# Patient Record
Sex: Male | Born: 1940 | Race: White | Hispanic: No | Marital: Married | Smoking: Never smoker
Health system: Southern US, Community
[De-identification: ages and names within clinical notes are randomized; demographics above are authoritative.]

## PROBLEM LIST (undated history)

## (undated) DIAGNOSIS — I499 Cardiac arrhythmia, unspecified: Secondary | ICD-10-CM

## (undated) DIAGNOSIS — M1990 Unspecified osteoarthritis, unspecified site: Secondary | ICD-10-CM

## (undated) DIAGNOSIS — R011 Cardiac murmur, unspecified: Secondary | ICD-10-CM

## (undated) DIAGNOSIS — I2510 Atherosclerotic heart disease of native coronary artery without angina pectoris: Secondary | ICD-10-CM

## (undated) DIAGNOSIS — F419 Anxiety disorder, unspecified: Secondary | ICD-10-CM

## (undated) DIAGNOSIS — R55 Syncope and collapse: Secondary | ICD-10-CM

## (undated) DIAGNOSIS — E785 Hyperlipidemia, unspecified: Secondary | ICD-10-CM

## (undated) DIAGNOSIS — G709 Myoneural disorder, unspecified: Secondary | ICD-10-CM

## (undated) DIAGNOSIS — M4800 Spinal stenosis, site unspecified: Secondary | ICD-10-CM

## (undated) DIAGNOSIS — I6529 Occlusion and stenosis of unspecified carotid artery: Secondary | ICD-10-CM

## (undated) DIAGNOSIS — I509 Heart failure, unspecified: Secondary | ICD-10-CM

## (undated) DIAGNOSIS — T7840XA Allergy, unspecified, initial encounter: Secondary | ICD-10-CM

## (undated) DIAGNOSIS — N400 Enlarged prostate without lower urinary tract symptoms: Secondary | ICD-10-CM

## (undated) DIAGNOSIS — I219 Acute myocardial infarction, unspecified: Secondary | ICD-10-CM

## (undated) DIAGNOSIS — I5030 Unspecified diastolic (congestive) heart failure: Secondary | ICD-10-CM

## (undated) DIAGNOSIS — I255 Ischemic cardiomyopathy: Secondary | ICD-10-CM

## (undated) DIAGNOSIS — M419 Scoliosis, unspecified: Secondary | ICD-10-CM

## (undated) DIAGNOSIS — H269 Unspecified cataract: Secondary | ICD-10-CM

## (undated) DIAGNOSIS — E119 Type 2 diabetes mellitus without complications: Secondary | ICD-10-CM

## (undated) DIAGNOSIS — T8859XA Other complications of anesthesia, initial encounter: Secondary | ICD-10-CM

## (undated) DIAGNOSIS — I10 Essential (primary) hypertension: Secondary | ICD-10-CM

## (undated) HISTORY — DX: Type 2 diabetes mellitus without complications: E11.9

## (undated) HISTORY — DX: Unspecified diastolic (congestive) heart failure: I50.30

## (undated) HISTORY — DX: Heart failure, unspecified: I50.9

## (undated) HISTORY — DX: Occlusion and stenosis of unspecified carotid artery: I65.29

## (undated) HISTORY — DX: Hyperlipidemia, unspecified: E78.5

## (undated) HISTORY — DX: Syncope and collapse: R55

## (undated) HISTORY — DX: Scoliosis, unspecified: M41.9

## (undated) HISTORY — DX: Allergy, unspecified, initial encounter: T78.40XA

## (undated) HISTORY — DX: Spinal stenosis, site unspecified: M48.00

## (undated) HISTORY — DX: Unspecified cataract: H26.9

## (undated) HISTORY — PX: SPINE SURGERY: SHX786

## (undated) HISTORY — DX: Benign prostatic hyperplasia without lower urinary tract symptoms: N40.0

## (undated) HISTORY — DX: Anxiety disorder, unspecified: F41.9

## (undated) HISTORY — PX: EYE SURGERY: SHX253

## (undated) HISTORY — PX: HERNIA REPAIR: SHX51

## (undated) HISTORY — PX: TONSILLECTOMY AND ADENOIDECTOMY: SUR1326

## (undated) HISTORY — DX: Atherosclerotic heart disease of native coronary artery without angina pectoris: I25.10

## (undated) HISTORY — PX: CORONARY ARTERY BYPASS GRAFT: SHX141

## (undated) HISTORY — DX: Myoneural disorder, unspecified: G70.9

## (undated) HISTORY — PX: CARDIAC CATHETERIZATION: SHX172

---

## 1988-04-07 HISTORY — PX: TRANSURETHRAL RESECTION OF PROSTATE: SHX73

## 2015-04-11 DIAGNOSIS — E784 Other hyperlipidemia: Secondary | ICD-10-CM | POA: Diagnosis not present

## 2015-04-11 DIAGNOSIS — I25810 Atherosclerosis of coronary artery bypass graft(s) without angina pectoris: Secondary | ICD-10-CM | POA: Diagnosis not present

## 2015-04-11 DIAGNOSIS — E119 Type 2 diabetes mellitus without complications: Secondary | ICD-10-CM | POA: Diagnosis not present

## 2015-04-11 DIAGNOSIS — M545 Low back pain: Secondary | ICD-10-CM | POA: Diagnosis not present

## 2015-04-11 DIAGNOSIS — I10 Essential (primary) hypertension: Secondary | ICD-10-CM | POA: Diagnosis not present

## 2015-04-11 DIAGNOSIS — M47816 Spondylosis without myelopathy or radiculopathy, lumbar region: Secondary | ICD-10-CM | POA: Diagnosis not present

## 2015-04-11 DIAGNOSIS — M4806 Spinal stenosis, lumbar region: Secondary | ICD-10-CM | POA: Diagnosis not present

## 2015-04-17 DIAGNOSIS — M545 Low back pain: Secondary | ICD-10-CM | POA: Diagnosis not present

## 2015-04-25 DIAGNOSIS — M4806 Spinal stenosis, lumbar region: Secondary | ICD-10-CM | POA: Diagnosis not present

## 2015-04-25 DIAGNOSIS — M545 Low back pain: Secondary | ICD-10-CM | POA: Diagnosis not present

## 2015-04-25 DIAGNOSIS — M47816 Spondylosis without myelopathy or radiculopathy, lumbar region: Secondary | ICD-10-CM | POA: Diagnosis not present

## 2015-04-30 DIAGNOSIS — M4806 Spinal stenosis, lumbar region: Secondary | ICD-10-CM | POA: Diagnosis not present

## 2015-05-01 DIAGNOSIS — M4806 Spinal stenosis, lumbar region: Secondary | ICD-10-CM | POA: Diagnosis not present

## 2015-05-04 DIAGNOSIS — M4806 Spinal stenosis, lumbar region: Secondary | ICD-10-CM | POA: Diagnosis not present

## 2015-05-09 DIAGNOSIS — M4806 Spinal stenosis, lumbar region: Secondary | ICD-10-CM | POA: Diagnosis not present

## 2015-05-14 DIAGNOSIS — M4806 Spinal stenosis, lumbar region: Secondary | ICD-10-CM | POA: Diagnosis not present

## 2015-05-16 DIAGNOSIS — M4806 Spinal stenosis, lumbar region: Secondary | ICD-10-CM | POA: Diagnosis not present

## 2015-05-21 DIAGNOSIS — M4806 Spinal stenosis, lumbar region: Secondary | ICD-10-CM | POA: Diagnosis not present

## 2015-05-22 DIAGNOSIS — M4806 Spinal stenosis, lumbar region: Secondary | ICD-10-CM | POA: Diagnosis not present

## 2015-06-05 DIAGNOSIS — M4806 Spinal stenosis, lumbar region: Secondary | ICD-10-CM | POA: Diagnosis not present

## 2015-06-20 DIAGNOSIS — M545 Low back pain: Secondary | ICD-10-CM | POA: Diagnosis not present

## 2015-06-20 DIAGNOSIS — M4806 Spinal stenosis, lumbar region: Secondary | ICD-10-CM | POA: Diagnosis not present

## 2015-07-02 DIAGNOSIS — Z6833 Body mass index (BMI) 33.0-33.9, adult: Secondary | ICD-10-CM | POA: Diagnosis not present

## 2015-07-02 DIAGNOSIS — Z719 Counseling, unspecified: Secondary | ICD-10-CM | POA: Diagnosis not present

## 2015-07-02 DIAGNOSIS — J209 Acute bronchitis, unspecified: Secondary | ICD-10-CM | POA: Diagnosis not present

## 2015-07-02 DIAGNOSIS — R062 Wheezing: Secondary | ICD-10-CM | POA: Diagnosis not present

## 2015-07-26 DIAGNOSIS — I25119 Atherosclerotic heart disease of native coronary artery with unspecified angina pectoris: Secondary | ICD-10-CM | POA: Diagnosis not present

## 2015-07-26 DIAGNOSIS — E119 Type 2 diabetes mellitus without complications: Secondary | ICD-10-CM | POA: Diagnosis not present

## 2015-07-26 DIAGNOSIS — I10 Essential (primary) hypertension: Secondary | ICD-10-CM | POA: Diagnosis not present

## 2015-07-26 DIAGNOSIS — E785 Hyperlipidemia, unspecified: Secondary | ICD-10-CM | POA: Diagnosis not present

## 2015-09-24 DIAGNOSIS — I10 Essential (primary) hypertension: Secondary | ICD-10-CM | POA: Diagnosis not present

## 2015-09-24 DIAGNOSIS — E119 Type 2 diabetes mellitus without complications: Secondary | ICD-10-CM | POA: Diagnosis not present

## 2015-09-24 DIAGNOSIS — I25810 Atherosclerosis of coronary artery bypass graft(s) without angina pectoris: Secondary | ICD-10-CM | POA: Diagnosis not present

## 2015-09-24 DIAGNOSIS — E784 Other hyperlipidemia: Secondary | ICD-10-CM | POA: Diagnosis not present

## 2015-11-05 DIAGNOSIS — E784 Other hyperlipidemia: Secondary | ICD-10-CM | POA: Diagnosis not present

## 2015-11-05 DIAGNOSIS — I25810 Atherosclerosis of coronary artery bypass graft(s) without angina pectoris: Secondary | ICD-10-CM | POA: Diagnosis not present

## 2015-11-05 DIAGNOSIS — I10 Essential (primary) hypertension: Secondary | ICD-10-CM | POA: Diagnosis not present

## 2015-11-30 DIAGNOSIS — E119 Type 2 diabetes mellitus without complications: Secondary | ICD-10-CM | POA: Diagnosis not present

## 2015-11-30 DIAGNOSIS — H35363 Drusen (degenerative) of macula, bilateral: Secondary | ICD-10-CM | POA: Diagnosis not present

## 2015-12-19 DIAGNOSIS — N401 Enlarged prostate with lower urinary tract symptoms: Secondary | ICD-10-CM | POA: Diagnosis not present

## 2015-12-19 DIAGNOSIS — E785 Hyperlipidemia, unspecified: Secondary | ICD-10-CM | POA: Diagnosis not present

## 2015-12-19 DIAGNOSIS — E119 Type 2 diabetes mellitus without complications: Secondary | ICD-10-CM | POA: Diagnosis not present

## 2015-12-19 DIAGNOSIS — I10 Essential (primary) hypertension: Secondary | ICD-10-CM | POA: Diagnosis not present

## 2016-04-08 DIAGNOSIS — Z23 Encounter for immunization: Secondary | ICD-10-CM | POA: Diagnosis not present

## 2016-04-24 ENCOUNTER — Ambulatory Visit: Payer: Self-pay | Admitting: Adult Health

## 2016-04-28 ENCOUNTER — Telehealth: Payer: Self-pay | Admitting: Adult Health

## 2016-04-28 NOTE — Telephone Encounter (Signed)
Cld [id number]# left patient msg to call back for rescheduling of missed 1/18 appt.--glh

## 2016-05-20 ENCOUNTER — Ambulatory Visit (INDEPENDENT_AMBULATORY_CARE_PROVIDER_SITE_OTHER): Payer: Medicare Other | Admitting: Adult Health

## 2016-05-20 ENCOUNTER — Encounter: Payer: Self-pay | Admitting: Adult Health

## 2016-05-20 VITALS — BP 128/74 | HR 54 | Ht 65.25 in | Wt 207.4 lb

## 2016-05-20 DIAGNOSIS — I25110 Atherosclerotic heart disease of native coronary artery with unstable angina pectoris: Secondary | ICD-10-CM | POA: Insufficient documentation

## 2016-05-20 DIAGNOSIS — I25118 Atherosclerotic heart disease of native coronary artery with other forms of angina pectoris: Secondary | ICD-10-CM

## 2016-05-20 DIAGNOSIS — R5383 Other fatigue: Secondary | ICD-10-CM

## 2016-05-20 DIAGNOSIS — I10 Essential (primary) hypertension: Secondary | ICD-10-CM | POA: Diagnosis not present

## 2016-05-20 DIAGNOSIS — Z0000 Encounter for general adult medical examination without abnormal findings: Secondary | ICD-10-CM

## 2016-05-20 DIAGNOSIS — E1159 Type 2 diabetes mellitus with other circulatory complications: Secondary | ICD-10-CM

## 2016-05-20 DIAGNOSIS — E1169 Type 2 diabetes mellitus with other specified complication: Secondary | ICD-10-CM | POA: Diagnosis not present

## 2016-05-20 DIAGNOSIS — E119 Type 2 diabetes mellitus without complications: Secondary | ICD-10-CM | POA: Diagnosis not present

## 2016-05-20 DIAGNOSIS — I209 Angina pectoris, unspecified: Secondary | ICD-10-CM | POA: Diagnosis not present

## 2016-05-20 DIAGNOSIS — E785 Hyperlipidemia, unspecified: Secondary | ICD-10-CM | POA: Diagnosis not present

## 2016-05-20 DIAGNOSIS — I152 Hypertension secondary to endocrine disorders: Secondary | ICD-10-CM

## 2016-05-20 LAB — POCT UA - MICROALBUMIN
Albumin/Creatinine Ratio, Urine, POC: <30
Creatinine, POC: 50 mg/dL
MICROALBUMIN (UR) POC: 30 mg/L

## 2016-05-20 LAB — POCT GLYCOSYLATED HEMOGLOBIN (HGB A1C): HEMOGLOBIN A1C: 5.9

## 2016-05-20 NOTE — Assessment & Plan Note (Signed)
Continue medications as directed. If needing daily Nitrostat 0.4 SL dosage (increase from 5 times weekly), please be seen in clinic immediately.   Referral for local Cardiologist placed-please establish with new practice.

## 2016-05-20 NOTE — Assessment & Plan Note (Signed)
Continue current medications and healthy eating/regular exercise. A1c today 5.9-great job!

## 2016-05-20 NOTE — Patient Instructions (Addendum)
Acute Coronary Syndrome Acute coronary syndrome (ACS) is a serious problem in which there is suddenly not enough blood and oxygen supplied to the heart. ACS may mean that one or more of the blood vessels in your heart (coronary arteries) may be blocked. ACS can result in chest pain or a heart attack (myocardial infarction or MI). What are the causes? This condition is caused by atherosclerosis, which is the buildup of fat and cholesterol (plaque) on the inside of the arteries. Over time, the plaque may narrow or block the artery, and this will lessen blood flow to the heart. Plaque can also become weak and break off within a coronary artery to form a clot and cause a sudden blockage. What increases the risk? The risk factors of this condition include:  High cholesterol levels.  High blood pressure (hypertension).  Smoking.  Diabetes.  Age.  Family history of chest pain, heart disease, or stroke.  Lack of exercise. What are the signs or symptoms? The most common signs of this condition include:  Chest pain, which can be:  A crushing or squeezing in the chest.  A tightness, pressure, fullness, or heaviness in the chest.  Present for more than a few minutes, or it can stop and recur.  Pain in the arms, neck, jaw, or back.  Unexplained heartburn or indigestion.  Shortness of breath.  Nausea.  Sudden cold sweats.  Feeling light-headed or dizzy. Sometimes, this condition has no symptoms. How is this diagnosed? ACS may be diagnosed through the following tests:  Electrocardiogram (ECG).  Blood tests.  Coronary angiogram. This is a procedure to look at the coronary arteries to see if there is any blockage. How is this treated? Treatment for ACS may include:  Healthy behavioral changes to reduce or control risk factors.  Medicine.  Coronary stenting.A stent helps to keep an artery open.  Coronary angioplasty. This procedure widens a narrowed or blocked  artery.  Coronary artery bypass surgery. This will allow your blood to pass the blockage (bypass) to reach your heart. Follow these instructions at home: Eating and drinking  Follow a heart-healthy diet. A dietitian can you help to educate you about healthy food options and changes.  Use healthy cooking methods such as roasting, grilling, broiling, baking, poaching, steaming, or stir-frying. Talk to a dietitian to learn more about healthy cooking methods. Medicines  Take medicines only as directed by your health care provider.  Do not take the following medicines unless your health care provider approves:  Nonsteroidal anti-inflammatory drugs (NSAIDs), such as ibuprofen, naproxen, or celecoxib.  Vitamin supplements that contain vitamin A, vitamin E, or both.  Hormone replacement therapy that contains estrogen with or without progestin.  Stop illegal drug use. Activity  Follow an exercise program that is approved by your health care provider.  Plan rest periods when you are fatigued. Lifestyle  Do not use any tobacco products, including cigarettes, chewing tobacco, or electronic cigarettes. If you need help quitting, ask your health care provider.  If you drink alcohol, and your health care provider approves, limit your alcohol intake to no more than 1 drink per day. One drink equals 12 ounces of beer, 5 ounces of wine, or 1 ounces of hard liquor.  Learn to manage stress.  Maintain a healthy weight. Lose weight as approved by your health care provider. General instructions  Manage other health conditions, such as hypertension and diabetes, as directed by your health care provider.  Keep all follow-up visits as directed by your  health care provider. This is important.  Your health care provider may ask you to monitor your blood pressure. A blood pressure reading consists of a higher number over a lower number, such as 110 over 72, written as 110/72. Ideally, your blood  pressure should be:  Below 140/90 if you have no other medical conditions.  Below 130/80 if you have diabetes or kidney disease. Get help right away if:  You have pain in your chest, neck, arm, jaw, stomach, or back that lasts more than a few minutes, is recurring, or is not relieved by taking medicine under your tongue (sublingual nitroglycerin).  You have profuse sweating without cause.  You have unexplained:  Heartburn or indigestion.  Shortness of breath or difficulty breathing.  Nausea or vomiting.  Fatigue.  Feelings of nervousness or anxiety.  Weakness.  Diarrhea.  You have sudden light-headedness or dizziness.  You faint. These symptoms may represent a serious problem that is an emergency. Do not wait to see if the symptoms will go away. Get medical help right away. Call your local emergency services (911 in the [place] Do not drive yourself to the clinic or hospital.  This information is not intended to replace advice given to you by your health care provider. Make sure you discuss any questions you have with your health care provider. Document Released: 03/24/2005 Document Revised: 09/05/2015 Document Reviewed: 07/26/2013 Elsevier Interactive Patient Education  2017 [place] and Cholesterol Restricted Diet Introduction Getting too much fat and cholesterol in your diet may cause health problems. Following this diet helps keep your fat and cholesterol at normal levels. This can keep you from getting sick. What types of fat should I choose?  Choose monosaturated and polyunsaturated fats. These are found in foods such as olive oil, canola oil, flaxseeds, walnuts, almonds, and seeds.  Eat more omega-3 fats. Good choices include salmon, mackerel, sardines, tuna, flaxseed oil, and ground flaxseeds.  Limit saturated fats. These are in animal products such as meats, butter, and cream. They can also be in plant products such as palm oil, palm kernel oil, and  coconut oil.  Avoid foods with partially hydrogenated oils in them. These contain trans fats. Examples of foods that have trans fats are stick margarine, some tub margarines, cookies, crackers, and other baked goods. What general guidelines do I need to follow?  Check food labels. Look for the words "trans fat" and "saturated fat."  When preparing a meal:  Fill half of your plate with vegetables and green salads.  Fill one fourth of your plate with whole grains. Look for the word "whole" as the first word in the ingredient list.  Fill one fourth of your plate with lean protein foods.  Eat more foods that have fiber, like apples, carrots, beans, peas, and barley.  Eat more home-cooked foods. Eat less at restaurants and buffets.  Limit or avoid alcohol.  Limit foods high in starch and sugar.  Limit fried foods.  Cook foods without frying them. Baking, boiling, grilling, and broiling are all great options.  Lose weight if you are overweight. Losing even a small amount of weight can help your overall health. It can also help prevent diseases such as diabetes and heart disease. What foods can I eat? Grains  Whole grains, such as whole wheat or whole grain breads, crackers, cereals, and pasta. Unsweetened oatmeal, bulgur, barley, quinoa, or brown rice. Corn or whole wheat flour tortillas. Vegetables  Fresh or frozen vegetables (raw, steamed, roasted, or grilled).  Green salads. Fruits  All fresh, canned (in natural juice), or frozen fruits. Meat and Other Protein Products  Ground beef (85% or leaner), grass-fed beef, or beef trimmed of fat. Skinless chicken or [place]. Ground chicken or [place]. Pork trimmed of fat. All fish and seafood. Eggs. Dried beans, peas, or lentils. Unsalted nuts or seeds. Unsalted canned or dry beans. Dairy  Low-fat dairy products, such as skim or 1% milk, 2% or reduced-fat cheeses, low-fat ricotta or cottage cheese, or plain low-fat yogurt. Fats and Oils  Tub  margarines without trans fats. Light or reduced-fat mayonnaise and salad dressings. Avocado. Olive, canola, sesame, or safflower oils. Natural peanut or almond butter (choose ones without added sugar and oil). The items listed above may not be a complete list of recommended foods or beverages. Contact your dietitian for more options.  What foods are not recommended? Grains  White bread. White pasta. White rice. Cornbread. Bagels, pastries, and croissants. Crackers that contain trans fat. Vegetables  White potatoes. Corn. Creamed or fried vegetables. Vegetables in a cheese sauce. Fruits  Dried fruits. Canned fruit in light or heavy syrup. Fruit juice. Meat and Other Protein Products  Fatty cuts of meat. Ribs, chicken wings, bacon, sausage, bologna, salami, chitterlings, fatback, hot dogs, bratwurst, and packaged luncheon meats. Liver and organ meats. Dairy  Whole or 2% milk, cream, half-and-half, and cream cheese. Whole milk cheeses. Whole-fat or sweetened yogurt. Full-fat cheeses. Nondairy creamers and whipped toppings. Processed cheese, cheese spreads, or cheese curds. Sweets and Desserts  Corn syrup, sugars, honey, and molasses. Candy. Jam and jelly. Syrup. Sweetened cereals. Cookies, pies, cakes, donuts, muffins, and ice cream. Fats and Oils  Butter, stick margarine, lard, shortening, ghee, or bacon fat. Coconut, palm kernel, or palm oils. Beverages  Alcohol. Sweetened drinks (such as sodas, lemonade, and fruit drinks or punches). The items listed above may not be a complete list of foods and beverages to avoid. Contact your dietitian for more information.  This information is not intended to replace advice given to you by your health care provider. Make sure you discuss any questions you have with your health care provider. Document Released: 09/23/2011 Document Revised: 11/29/2015 Document Reviewed: 06/23/2013  2017 Elsevier Diabetes Mellitus and Exercise Exercising regularly is  important for your overall health, especially when you have diabetes (diabetes mellitus). Exercising is not only about losing weight. It has many health benefits, such as increasing muscle strength and bone density and reducing body fat and stress. This leads to improved fitness, flexibility, and endurance, all of which result in better overall health. Exercise has additional benefits for people with diabetes, including:  Reducing appetite.  Helping to lower and control blood glucose.  Lowering blood pressure.  Helping to control amounts of fatty substances (lipids) in the blood, such as cholesterol and triglycerides.  Helping the body to respond better to insulin (improving insulin sensitivity).  Reducing how much insulin the body needs.  Decreasing the risk for heart disease by:  Lowering cholesterol and triglyceride levels.  Increasing the levels of good cholesterol.  Lowering blood glucose levels. What is my activity plan? Your health care provider or certified diabetes educator can help you make a plan for the type and frequency of exercise (activity plan) that works for you. Make sure that you:  Do at least 150 minutes of moderate-intensity or vigorous-intensity exercise each week. This could be brisk walking, biking, or water aerobics.  Do stretching and strength exercises, such as yoga or weightlifting, at least 2 times  a week.  Spread out your activity over at least 3 days of the week.  Get some form of physical activity every day.  Do not go more than 2 days in a row without some kind of physical activity.  Avoid being inactive for more than 90 minutes at a time. Take frequent breaks to walk or stretch.  Choose a type of exercise or activity that you enjoy, and set realistic goals.  Start slowly, and gradually increase the intensity of your exercise over time. What do I need to know about managing my diabetes?  Check your blood glucose before and after  exercising.  If your blood glucose is higher than 240 mg/dL (13.3 mmol/L) before you exercise, check your urine for ketones. If you have ketones in your urine, do not exercise until your blood glucose returns to normal.  Know the symptoms of low blood glucose (hypoglycemia) and how to treat it. Your risk for hypoglycemia increases during and after exercise. Common symptoms of hypoglycemia can include:  Hunger.  Anxiety.  Sweating and feeling clammy.  Confusion.  Dizziness or feeling light-headed.  Increased heart rate or palpitations.  Blurry vision.  Tingling or numbness around the mouth, lips, or tongue.  Tremors or shakes.  Irritability.  Keep a rapid-acting carbohydrate snack available before, during, and after exercise to help prevent or treat hypoglycemia.  Avoid injecting insulin into areas of the body that are going to be exercised. For example, avoid injecting insulin into:  The arms, when playing tennis.  The legs, when jogging.  Keep records of your exercise habits. Doing this can help you and your health care provider adjust your diabetes management plan as needed. Write down:  Food that you eat before and after you exercise.  Blood glucose levels before and after you exercise.  The type and amount of exercise you have done.  When your insulin is expected to peak, if you use insulin. Avoid exercising at times when your insulin is peaking.  When you start a new exercise or activity, work with your health care provider to make sure the activity is safe for you, and to adjust your insulin, medicines, or food intake as needed.  Drink plenty of water while you exercise to prevent dehydration or heat stroke. Drink enough fluid to keep your urine clear or pale yellow. This information is not intended to replace advice given to you by your health care provider. Make sure you discuss any questions you have with your health care provider. Document Released:  06/14/2003 Document Revised: 10/12/2015 Document Reviewed: 09/03/2015 Elsevier Interactive Patient Education  2017 [place].  Referral for Cardiologist placed. Return for fasting lab nurse visit ASAP. Follow-up every 6 months, or sooner if needed.

## 2016-05-20 NOTE — Progress Notes (Signed)
Subjective:    Patient ID: Jacob Harrison, male    DOB: [birth year], 76 y.o.    MRN: [id number]  HPI:  Jacob Harrison is here to establish as a new pt.  He is very pleasant 76 year old with significant medical hx of CAD, HTN, T2D, Hyperlipidemia, and obesity.  He is compliant on all his medications and denies SE.  He visits Cardiologist every 6 months, next appt. March 2018.  He volunteers and works in yard almost daily.  He reports needing to use 1 Nitrostat 0.4 tab SL 5 days a week.  The angina will occur when he is seated/resting, and sx's a re completed resolved after just 1 Nitrostat dose.     Patient Care Team    Relationship Specialty Notifications Start End  Jacob Aquas, NP PCP - General Family Medicine  05/20/16     Patient Active Problem List   Diagnosis Date Noted  . Diabetes mellitus without complication ([place]) [id number]  . Hypertension associated with diabetes ([place]) 05/20/2016  . Hyperlipidemia associated with type 2 diabetes mellitus ([place]) 05/20/2016  . Coronary artery disease 05/20/2016     Past Medical History:  Diagnosis Date  . BPH (benign prostatic hyperplasia)   . CHF (congestive heart failure) ([place])   . Coronary artery disease   . Diabetes mellitus without complication ([place])   . Hyperlipidemia      Past Surgical History:  Procedure Laterality Date  . CORONARY ARTERY BYPASS GRAFT     x 2  . HERNIA REPAIR Left    inguinal  . TONSILLECTOMY AND ADENOIDECTOMY    . TRANSURETHRAL RESECTION OF PROSTATE       Family History  Problem Relation Age of Onset  . Diabetes Mother   . Hyperlipidemia Mother   . Cancer Father     colon  . Alcohol abuse Maternal Uncle   . Diabetes Maternal Uncle   . Hyperlipidemia Maternal Uncle   . Cancer Paternal Uncle     colon     History  Drug Use No     History  Alcohol Use  . 8.4 oz/week  . 7 Glasses of wine, 7 Shots of liquor per week     History  Smoking Status  . Never Smoker  Smokeless Tobacco  . Never  Used     Outpatient Encounter Prescriptions as of 05/20/2016  Medication Sig  . aspirin 325 MG EC tablet Take 325 mg by mouth daily.  . Coenzyme Q10 (CO Q-10) 200 MG CAPS Take 1 capsule by mouth daily.  . cyanocobalamin 500 MCG tablet Take 500 mcg by mouth daily.  . finasteride (PROSCAR) 5 MG tablet Take 1 tablet by mouth daily.  . Glucosamine-Chondroit-Vit C-Mn (GLUCOSAMINE 1500 COMPLEX) CAPS Take 1 capsule by mouth daily.  . metFORMIN (GLUCOPHAGE) 500 MG tablet Take 1,000 mg by mouth daily.  . metoprolol tartrate (LOPRESSOR) 25 MG tablet Take 1 tablet by mouth 2 (two) times daily.  . Multiple Vitamin (MULTIVITAMIN) tablet Take 1 tablet by mouth daily.  . nitroGLYCERIN (NITROSTAT) 0.4 MG SL tablet Place 1 tablet under the tongue as needed.  Marland Kitchen RANEXA 500 MG 12 hr tablet Take 1 tablet by mouth 2 (two) times daily.  . rosuvastatin (CRESTOR) 20 MG tablet Take 1 tablet by mouth at bedtime.  . traMADol (ULTRAM) 50 MG tablet Take by mouth every 6 (six) hours as needed.   No facility-administered encounter medications on file as of 05/20/2016.     Allergies: Tetracyclines & related  Body mass index is 34.25 kg/m.  Blood pressure 128/74, pulse (!) 54, height 5' 5.25" (1.657 m), weight 207 lb 6.4 oz (94.1 kg).     Review of Systems  Constitutional: Negative for activity change, appetite change, chills, diaphoresis, fatigue, fever and unexpected weight change.  HENT: Negative for congestion and postnasal drip.   Eyes: Negative for visual disturbance.  Respiratory: Negative for cough and shortness of breath.   Cardiovascular: Positive for chest pain. Negative for palpitations and leg swelling.       Requires 1 dose of Nitrostat 0.4 SL approximately 5 days a week. He will see Cardiologist March 2018.  Gastrointestinal: Negative for abdominal distention, abdominal pain, anal bleeding, blood in stool, constipation, diarrhea, nausea and vomiting.  Endocrine: Negative for cold intolerance,  heat intolerance, polydipsia, polyphagia and polyuria.  Genitourinary: Negative for difficulty urinating and flank pain.  Musculoskeletal: Positive for arthralgias and myalgias. Negative for back pain, gait problem, joint swelling, neck pain and neck stiffness.       "Minor aches and pains upon rising in the am that will improve with movement".  Skin: Negative for color change, pallor, rash and wound.  Neurological: Negative for dizziness, tremors, syncope and weakness.  Psychiatric/Behavioral: Negative for agitation, behavioral problems and sleep disturbance.       Objective:   Physical Exam  Constitutional: He is oriented to person, place, and time. He appears well-developed and well-nourished. No distress.  HENT:  Head: Normocephalic and atraumatic.  Right Ear: External ear normal.  Left Ear: External ear normal.  Eyes: Conjunctivae and EOM are normal. Pupils are equal, round, and reactive to light.  Neck: Normal range of motion. Neck supple. No tracheal deviation present. No thyromegaly present.  Cardiovascular: Normal rate, regular rhythm, normal heart sounds and intact distal pulses.   No murmur heard. Pulmonary/Chest: Effort normal and breath sounds normal. He has no wheezes. He exhibits no tenderness.  Abdominal: Soft. Bowel sounds are normal. He exhibits no distension and no mass. There is no tenderness. There is no rebound and no guarding.  Musculoskeletal: Normal range of motion. He exhibits no tenderness.  Lymphadenopathy:    He has no cervical adenopathy.  Neurological: He is alert and oriented to person, place, and time. He has normal reflexes.  Skin: Skin is warm and dry. No rash noted. He is not diaphoretic. No erythema. No pallor.  Psychiatric: He has a normal mood and affect. His behavior is normal. Judgment and thought content normal.  Nursing note and vitals reviewed.         Assessment & Plan:   1. Routine physical examination   2. Diabetes mellitus without  complication ([place])   3. Hypertension associated with diabetes ([place])   4. Hyperlipidemia associated with type 2 diabetes mellitus ([place])   5. Coronary artery disease of native heart with stable angina pectoris, unspecified vessel or lesion type ([place])   6. Other fatigue     Hypertension associated with diabetes ([place]) Continue medications as directed.   Referral for local Cardiologist placed-please establish with new practice.  Coronary artery disease Continue medications as directed. If needing daily Nitrostat 0.4 SL dosage (increase from 5 times weekly), please be seen in clinic immediately.   Referral for local Cardiologist placed-please establish with new practice.  Diabetes mellitus without complication ([place]) Continue current medications and healthy eating/regular exercise. A1c today 5.9-great job!  Hyperlipidemia associated with type 2 diabetes mellitus ([place]) Continue current medications and healthy eating/regular exercise.    FOLLOW-UP:  Return  in about 6 months (around 11/17/2016) for Regular Follow Up, Diabetes, HTN.

## 2016-05-20 NOTE — Assessment & Plan Note (Signed)
Continue current medications and healthy eating/regular exercise.

## 2016-05-20 NOTE — Assessment & Plan Note (Signed)
Continue medications as directed.   Referral for local Cardiologist placed-please establish with new practice.

## 2016-05-22 ENCOUNTER — Other Ambulatory Visit (INDEPENDENT_AMBULATORY_CARE_PROVIDER_SITE_OTHER): Payer: Medicare Other

## 2016-05-22 DIAGNOSIS — E785 Hyperlipidemia, unspecified: Secondary | ICD-10-CM

## 2016-05-22 DIAGNOSIS — E119 Type 2 diabetes mellitus without complications: Secondary | ICD-10-CM | POA: Diagnosis not present

## 2016-05-22 DIAGNOSIS — E1159 Type 2 diabetes mellitus with other circulatory complications: Secondary | ICD-10-CM | POA: Diagnosis not present

## 2016-05-22 DIAGNOSIS — I10 Essential (primary) hypertension: Secondary | ICD-10-CM | POA: Diagnosis not present

## 2016-05-22 DIAGNOSIS — R5383 Other fatigue: Secondary | ICD-10-CM | POA: Diagnosis not present

## 2016-05-22 DIAGNOSIS — E539 Vitamin B deficiency, unspecified: Secondary | ICD-10-CM | POA: Diagnosis not present

## 2016-05-22 DIAGNOSIS — Z0000 Encounter for general adult medical examination without abnormal findings: Secondary | ICD-10-CM | POA: Diagnosis not present

## 2016-05-22 DIAGNOSIS — E1169 Type 2 diabetes mellitus with other specified complication: Secondary | ICD-10-CM | POA: Diagnosis not present

## 2016-05-22 DIAGNOSIS — E559 Vitamin D deficiency, unspecified: Secondary | ICD-10-CM | POA: Diagnosis not present

## 2016-05-22 DIAGNOSIS — I152 Hypertension secondary to endocrine disorders: Secondary | ICD-10-CM

## 2016-05-22 DIAGNOSIS — R29898 Other symptoms and signs involving the musculoskeletal system: Secondary | ICD-10-CM | POA: Diagnosis not present

## 2016-05-23 LAB — COMPREHENSIVE METABOLIC PANEL
ALBUMIN: 4.5 g/dL (ref 3.5–4.8)
ALT: 33 IU/L (ref 0–44)
AST: 29 IU/L (ref 0–40)
Albumin/Globulin Ratio: 2.3 — ABNORMAL HIGH (ref 1.2–2.2)
Alkaline Phosphatase: 90 IU/L (ref 39–117)
BUN / CREAT RATIO: 20 (ref 10–24)
BUN: 15 mg/dL (ref 8–27)
Bilirubin Total: 0.5 mg/dL (ref 0.0–1.2)
CALCIUM: 9.6 mg/dL (ref 8.6–10.2)
CO2: 22 mmol/L (ref 18–29)
CREATININE: 0.76 mg/dL (ref 0.76–1.27)
Chloride: 97 mmol/L (ref 96–106)
GFR calc Af Amer: 105 mL/min/{1.73_m2} (ref >59)
GFR, EST NON AFRICAN AMERICAN: 91 mL/min/{1.73_m2} (ref >59)
GLUCOSE: 100 mg/dL — AB (ref 65–99)
Globulin, Total: 2 g/dL (ref 1.5–4.5)
Potassium: 4.6 mmol/L (ref 3.5–5.2)
Sodium: 139 mmol/L (ref 134–144)
TOTAL PROTEIN: 6.5 g/dL (ref 6.0–8.5)

## 2016-05-23 LAB — CBC WITH DIFFERENTIAL/PLATELET
BASOS ABS: 0 10*3/uL (ref 0.0–0.2)
Basos: 0 %
EOS (ABSOLUTE): 0.2 10*3/uL (ref 0.0–0.4)
Eos: 2 %
HEMOGLOBIN: 15.1 g/dL (ref 13.0–17.7)
Hematocrit: 45.9 % (ref 37.5–51.0)
IMMATURE GRANS (ABS): 0 10*3/uL (ref 0.0–0.1)
IMMATURE GRANULOCYTES: 0 %
LYMPHS: 26 %
Lymphocytes Absolute: 2.6 10*3/uL (ref 0.7–3.1)
MCH: 30.4 pg (ref 26.6–33.0)
MCHC: 32.9 g/dL (ref 31.5–35.7)
MCV: 92 fL (ref 79–97)
MONOCYTES: 9 %
Monocytes Absolute: 1 10*3/uL — ABNORMAL HIGH (ref 0.1–0.9)
NEUTROS PCT: 63 %
Neutrophils Absolute: 6.4 10*3/uL (ref 1.4–7.0)
PLATELETS: 228 10*3/uL (ref 150–379)
RBC: 4.97 x10E6/uL (ref 4.14–5.80)
RDW: 14 % (ref 12.3–15.4)
WBC: 10.3 10*3/uL (ref 3.4–10.8)

## 2016-05-23 LAB — LIPID PANEL
CHOL/HDL RATIO: 3.3 ratio (ref 0.0–5.0)
Cholesterol, Total: 169 mg/dL (ref 100–199)
HDL: 51 mg/dL (ref >39)
LDL CALC: 76 mg/dL (ref 0–99)
Triglycerides: 210 mg/dL — ABNORMAL HIGH (ref 0–149)
VLDL CHOLESTEROL CAL: 42 mg/dL — AB (ref 5–40)

## 2016-05-23 LAB — VITAMIN B12: Vitamin B-12: 1178 pg/mL (ref 232–1245)

## 2016-05-23 LAB — TSH: TSH: 1.64 u[IU]/mL (ref 0.450–4.500)

## 2016-05-23 LAB — VITAMIN D 25 HYDROXY (VIT D DEFICIENCY, FRACTURES): VIT D 25 HYDROXY: 26.4 ng/mL — AB (ref 30.0–100.0)

## 2016-05-26 ENCOUNTER — Other Ambulatory Visit: Payer: Self-pay | Admitting: Adult Health

## 2016-05-26 DIAGNOSIS — E781 Pure hyperglyceridemia: Secondary | ICD-10-CM

## 2016-05-26 MED ORDER — EZETIMIBE 10 MG PO TABS: 10.0000 mg | ORAL_TABLET | Freq: Every day | ORAL | 3 refills | Status: DC

## 2016-05-28 ENCOUNTER — Other Ambulatory Visit: Payer: Self-pay

## 2016-05-28 DIAGNOSIS — E1159 Type 2 diabetes mellitus with other circulatory complications: Secondary | ICD-10-CM

## 2016-05-28 DIAGNOSIS — I25118 Atherosclerotic heart disease of native coronary artery with other forms of angina pectoris: Secondary | ICD-10-CM

## 2016-05-28 DIAGNOSIS — I152 Hypertension secondary to endocrine disorders: Secondary | ICD-10-CM

## 2016-05-28 DIAGNOSIS — I10 Essential (primary) hypertension: Secondary | ICD-10-CM

## 2016-06-12 ENCOUNTER — Encounter: Payer: Self-pay | Admitting: Internal Medicine

## 2016-06-19 ENCOUNTER — Encounter: Payer: Self-pay | Admitting: Internal Medicine

## 2016-06-19 ENCOUNTER — Ambulatory Visit (INDEPENDENT_AMBULATORY_CARE_PROVIDER_SITE_OTHER): Payer: Medicare Other | Admitting: Internal Medicine

## 2016-06-19 VITALS — BP 134/58 | HR 58 | Ht 65.25 in | Wt 212.0 lb

## 2016-06-19 DIAGNOSIS — I25118 Atherosclerotic heart disease of native coronary artery with other forms of angina pectoris: Secondary | ICD-10-CM | POA: Diagnosis not present

## 2016-06-19 DIAGNOSIS — E785 Hyperlipidemia, unspecified: Secondary | ICD-10-CM | POA: Diagnosis not present

## 2016-06-19 DIAGNOSIS — R29898 Other symptoms and signs involving the musculoskeletal system: Secondary | ICD-10-CM

## 2016-06-19 MED ORDER — RANOLAZINE ER 1000 MG PO TB12: 1000.0000 mg | ORAL_TABLET | Freq: Two times a day (BID) | ORAL | 1 refills | Status: DC

## 2016-06-19 NOTE — Progress Notes (Signed)
New Outpatient Visit Date: 06/19/2016  Referring Provider: Odella Aquas, NP [address], [place] [address]  Chief Complaint: Chest pain  HPI:  Jacob Harrison is a 76 y.o. year-old male with history of coronary artery disease s/p CABG x 2 (2001 and 2012), type 2 diabetes mellitus, congestive heart failure, and hyperlipidemia, who has been referred by Dr. Arvil Persons for management of cardiac disease. The patient moved to [place] from [place], [place], in 01/2016 and was previously followed by [facility] in [place], [place]. He reports feeling well, though he has noticed a slight increase in the frequency of his chest pain. He occasionally notes tightness across the chest, which most commonly occurs at rest. The pain resolves promptly with a single sublingual nitroglycerin tablet. He is taking about 10-15 NTG tabs a month, compared with 5-10 about 3-6 months ago. There are no associated symptoms. Additionally, he is able to walk and do strenuous activities around the house without any chest pain or other limitations.  Jacob Harrison denies shortness of breath, orthopnea, PND, palpitations, and lightheadedness. He has mild dependent leg swelling, which he attributes to vein harvesting for CABG x 2. Jacob Harrison reports weakness in both legs at times, but denies pain and states that he was told that his leg circulation is fine. He is currently on rosuvastatin and ezetimibe for control of his cholesterol, as there was concern that atorvastatin may have been contributing to his leg weakness.  --------------------------------------------------------------------------------------------------  Cardiovascular History & Procedures: Cardiovascular Problems:  Coronary artery disease  Ischemic cardiomyopathy  Risk Factors:  Known coronary artery disease, hyperlipidemia, diabetes mellitus, male gender, obesity, and age > 53  Cath/PCI:  LHC (10/18/12, [place], [place]): LMCA 20-30%  ostial/proximal stenosis. LAD with sequential 100% and 95-99% mid stenoses. LCx with 40-50% mid stenosis. RCA with 100% proximal and 80-100% sequential distal lesions. RPDA and rPL1 have 99% and 95% stenoses, respectively. SVG->LAD and SVG->rPDA are patent. LVEF 67% with normal wall motion.  LHC (04/05/10, [place], [place]): LMCA with 30% mid stenosis. LAD with 100% midvessel occlution. LCx with 40% mid stenosis. RCA with 100% proximal stenosis. LIMA->LAD atretic, SVG->LCx occluded, and SVG-> RCA with 80% ostial and 70% distal stenoses.  CV Surgery:  Redo CABG (04/2010, [place], [place]): SVG->LAD and SVG->rPDA.  CABG (2001, [place]): LIMA->LAD, SVG->LCx, and SVG->rPDA  EP Procedures and Devices:  None  Non-Invasive Evaluation(s):  ABIs (03/08/15): Right 1.25, left 1.26; no significant change with exercise.  Exercise myocardial perfusion stress test (10/07/12): Small to moderate in size, mild to moderate in severity, partially reversible defect involving the mid inferior and inferolateral segments. LVEF 65%.  Recent CV Pertinent Labs: Lab Results  Component Value Date   CHOL 169 05/22/2016   HDL 51 05/22/2016   LDLCALC 76 05/22/2016   TRIG 210 (H) 05/22/2016   CHOLHDL 3.3 05/22/2016   K 4.6 05/22/2016   BUN 15 05/22/2016   CREATININE 0.76 05/22/2016    --------------------------------------------------------------------------------------------------  Past Medical History:  Diagnosis Date  . BPH (benign prostatic hyperplasia)   . CHF (congestive heart failure) ([place])   . Coronary artery disease   . Diabetes mellitus without complication ([place])   . Hyperlipidemia     Past Surgical History:  Procedure Laterality Date  . CORONARY ARTERY BYPASS GRAFT     x 2 (2001 and redo in late 2011 or early 2012)  . HERNIA REPAIR Left    inguinal  . TONSILLECTOMY AND ADENOIDECTOMY    . TRANSURETHRAL  [place]    Outpatient Encounter Prescriptions as of  06/19/2016  Medication Sig  . aspirin 325 MG EC tablet Take 325 mg by mouth daily.  . Cholecalciferol (VITAMIN D-3 PO) Take 800 Units by mouth daily.  . Coenzyme Q10 (CO Q-10) 200 MG CAPS Take 1 capsule by mouth daily.  . cyanocobalamin 500 MCG tablet Take 500 mcg by mouth daily.  Marland Kitchen ezetimibe (ZETIA) 10 MG tablet Take 1 tablet (10 mg total) by mouth daily.  . finasteride (PROSCAR) 5 MG tablet Take 1 tablet by mouth daily.  . Glucosamine-Chondroit-Vit C-Mn (GLUCOSAMINE 1500 COMPLEX) CAPS Take 1 capsule by mouth daily.  . metFORMIN (GLUCOPHAGE) 500 MG tablet Take 1,000 mg by mouth daily.  . metoprolol tartrate (LOPRESSOR) 25 MG tablet Take 1 tablet by mouth 2 (two) times daily.  . Multiple Vitamin (MULTIVITAMIN) tablet Take 1 tablet by mouth daily.  . nitroGLYCERIN (NITROSTAT) 0.4 MG SL tablet Place 1 tablet under the tongue as needed.  Marland Kitchen RANEXA 500 MG 12 hr tablet Take 1 tablet by mouth 2 (two) times daily.  . rosuvastatin (CRESTOR) 20 MG tablet Take 1 tablet by mouth at bedtime.  . traMADol (ULTRAM) 50 MG tablet Take by mouth every 6 (six) hours as needed.   No facility-administered encounter medications on file as of 06/19/2016.     Allergies: Tetracyclines & related  Social History   Social History  . Marital status: Married    Spouse name: N/A  . Number of children: N/A  . Years of education: N/A   Occupational History  . Not on file.   Social History Main Topics  . Smoking status: Never Smoker  . Smokeless tobacco: Never Used  . Alcohol use 6.6 oz/week    6 Cans of beer, 5 Glasses of wine per week  . Drug use: No  . Sexual activity: Not Currently    Birth control/ protection: None   Other Topics Concern  . Not on file   Social History Narrative  . No narrative on file    Family History  Problem Relation Age of Onset  . Diabetes Mother   . Hyperlipidemia Mother   . Cancer Father     colon  . Alcohol abuse Maternal Uncle   . Diabetes Maternal Uncle   .  Hyperlipidemia Maternal Uncle   . Cancer Paternal Uncle     colon    Review of Systems: A 12-system review of systems was performed and was negative except as noted in the HPI.  --------------------------------------------------------------------------------------------------  Physical Exam: BP (!) 134/58   Pulse (!) 58 Comment: 58  Ht 5' 5.25" (1.657 m)   Wt 212 lb (96.2 kg)   SpO2 94%   BMI 35.01 kg/m   General:  Obese man, seated comfortably in the exam room. HEENT: No conjunctival pallor or scleral icterus.  Moist mucous membranes.  OP clear. Neck: Supple without lymphadenopathy, thyromegaly, JVD, or HJR.  No carotid bruit. Lungs: Normal work of breathing.  Clear to auscultation bilaterally without wheezes or crackles. Heart: Regular rate and rhythm without murmurs, rubs, or gallops. Non-displaced PMI. Well-healed median sternotomy scar. Abd: Bowel sounds present.  Soft, NT/ND without hepatosplenomegaly Ext: Trace pretibial edema.  Radial and DP pulses are 2+ bilaterally. PT pulses are 1+ bilaterally. Skin: warm and dry without rash Neuro: CNIII-XII intact.  Strength and fine-touch sensation intact in upper and lower extremities bilaterally. Psych: Normal mood and affect.  EKG:  Sinus bradycardia (HR 52 bpm). Otherwise,  no significant abnormalities. No significant change from prior outside tracing on 08/07/14 (I have personally reviewed both tracings).  Lab Results  Component Value Date   WBC 10.3 05/22/2016   HCT 45.9 05/22/2016   MCV 92 05/22/2016   PLT 228 05/22/2016    Lab Results  Component Value Date   NA 139 05/22/2016   K 4.6 05/22/2016   CL 97 05/22/2016   CO2 22 05/22/2016   BUN 15 05/22/2016   CREATININE 0.76 05/22/2016   GLUCOSE 100 (H) 05/22/2016   ALT 33 05/22/2016    Lab Results  Component Value Date   CHOL 169 05/22/2016   HDL 51 05/22/2016   LDLCALC 76 05/22/2016   TRIG 210 (H) 05/22/2016   CHOLHDL 3.3 05/22/2016      --------------------------------------------------------------------------------------------------  ASSESSMENT AND PLAN: Coronary artery disease with stable angina Patient notes slight increase in NTG use over the last 3 months, though his chest pain is otherwise unchanged, consistent with stable angina. Interesting, he is able to walk and perform strenuous activities around the house without any pain, suggesting a possible non-cardiac component to his chest pain. We have discussed further evaluation and treatment options and have agreed to increase ranolazine to 1000 mg BID. If his pain does not improve or worsens, we will need to consider repeat ischemia evaluation. We will continue his current regimen for secondary prevention, including high-dose rosuvastatin and ezetimibe. I recommend that he decrease aspirin from 325 mg daily to 81 mg daily to minimize the risk for GI side-effects and bleeding.  Hyperlipidemia Jacob Harrison is tolerating high-dose rosuvastatin and ezetimibe well. His most recent LDL before addition of ezetimibe was just above our goal of 70. If he remains above this target in the future, we will need to consider addition of a PCSK9 inhibitor. I also encouraged the patient to exercise and lose weight.  Leg weakness Etiology not entirely clear. He was felt to have normal LE circulation based on ABIs in [place], [place] in 2016. However, the ABIs were supranormal (both at rest and with exertion), likely due to non-compressible arteries. We may need to repeat the study with TBIs or include Doppler measurements to exclude underlying PVD if his symptoms persist or worsen.  Follow-up: Return to clinic in 3 months.  Nelva Bush, MD 06/21/2016 3:48 PM

## 2016-06-19 NOTE — Patient Instructions (Signed)
Medication Instructions:  Increase Ranexa (ranolazine) to 1000mg  daily.  You can take 2 of your 500mg  tablets two times a day and use your current supply.  Labwork: none  Testing/Procedures: None   Follow-Up: Your physician recommends that you schedule a follow-up appointment in: 3 months with Dr End.   Any Other Special Instructions Will Be Listed Below (If Applicable).  If in about 2 weeks, after increasing Ranexa (ranolazine) to 1000mg  two times a day, and you are still having chest pain, try taking omeprazole 20mg  daily.   If you need a refill on your cardiac medications before your next appointment, please call your pharmacy.

## 2016-06-21 DIAGNOSIS — E785 Hyperlipidemia, unspecified: Secondary | ICD-10-CM | POA: Insufficient documentation

## 2016-06-21 DIAGNOSIS — R29898 Other symptoms and signs involving the musculoskeletal system: Secondary | ICD-10-CM | POA: Insufficient documentation

## 2016-06-21 DIAGNOSIS — E782 Mixed hyperlipidemia: Secondary | ICD-10-CM | POA: Insufficient documentation

## 2016-06-23 ENCOUNTER — Other Ambulatory Visit: Payer: Self-pay

## 2016-06-23 DIAGNOSIS — Z79899 Other long term (current) drug therapy: Secondary | ICD-10-CM

## 2016-06-23 NOTE — Addendum Note (Signed)
Addended by: Marlis Edelson C on: 06/23/2016 01:16 PM   Modules accepted: Orders

## 2016-06-27 ENCOUNTER — Other Ambulatory Visit (INDEPENDENT_AMBULATORY_CARE_PROVIDER_SITE_OTHER): Payer: Medicare Other

## 2016-06-27 DIAGNOSIS — E559 Vitamin D deficiency, unspecified: Secondary | ICD-10-CM | POA: Insufficient documentation

## 2016-06-27 DIAGNOSIS — Z79899 Other long term (current) drug therapy: Secondary | ICD-10-CM | POA: Diagnosis not present

## 2016-06-27 DIAGNOSIS — E539 Vitamin B deficiency, unspecified: Secondary | ICD-10-CM | POA: Insufficient documentation

## 2016-06-28 LAB — HEPATIC FUNCTION PANEL
ALBUMIN: 4.9 g/dL — AB (ref 3.5–4.8)
ALK PHOS: 88 IU/L (ref 39–117)
ALT: 35 IU/L (ref 0–44)
AST: 36 IU/L (ref 0–40)
BILIRUBIN TOTAL: 0.7 mg/dL (ref 0.0–1.2)
Bilirubin, Direct: 0.19 mg/dL (ref 0.00–0.40)
Total Protein: 7.2 g/dL (ref 6.0–8.5)

## 2016-07-22 ENCOUNTER — Encounter: Payer: Self-pay | Admitting: Adult Health

## 2016-07-22 ENCOUNTER — Ambulatory Visit (INDEPENDENT_AMBULATORY_CARE_PROVIDER_SITE_OTHER): Payer: Medicare Other | Admitting: Adult Health

## 2016-07-22 DIAGNOSIS — R05 Cough: Secondary | ICD-10-CM | POA: Diagnosis not present

## 2016-07-22 DIAGNOSIS — R059 Cough, unspecified: Secondary | ICD-10-CM | POA: Insufficient documentation

## 2016-07-22 DIAGNOSIS — I25118 Atherosclerotic heart disease of native coronary artery with other forms of angina pectoris: Secondary | ICD-10-CM

## 2016-07-22 MED ORDER — HYDROCOD POLST-CPM POLST ER 10-8 MG/5ML PO SUER: 5.0000 mL | Freq: Every evening | ORAL | 0 refills | Status: DC | PRN

## 2016-07-22 NOTE — Patient Instructions (Signed)
Cough, Adult Coughing is a reflex that clears your throat and your airways. Coughing helps to heal and protect your lungs. It is normal to cough occasionally, but a cough that happens with other symptoms or lasts a long time may be a sign of a condition that needs treatment. A cough may last only 2-3 weeks (acute), or it may last longer than 8 weeks (chronic). What are the causes? Coughing is commonly caused by:  Breathing in substances that irritate your lungs.  A viral or bacterial respiratory infection.  Allergies.  Asthma.  Postnasal drip.  Smoking.  Acid backing up from the stomach into the esophagus (gastroesophageal reflux).  Certain medicines.  Chronic lung problems, including COPD (or rarely, lung cancer).  Other medical conditions such as heart failure. Follow these instructions at home: Pay attention to any changes in your symptoms. Take these actions to help with your discomfort:  Take medicines only as told by your health care provider.  If you were prescribed an antibiotic medicine, take it as told by your health care provider. Do not stop taking the antibiotic even if you start to feel better.  Talk with your health care provider before you take a cough suppressant medicine.  Drink enough fluid to keep your urine clear or pale yellow.  If the air is dry, use a cold steam vaporizer or humidifier in your bedroom or your home to help loosen secretions.  Avoid anything that causes you to cough at work or at home.  If your cough is worse at night, try sleeping in a semi-upright position.  Avoid cigarette smoke. If you smoke, quit smoking. If you need help quitting, ask your health care provider.  Avoid caffeine.  Avoid alcohol.  Rest as needed. Contact a health care provider if:  You have new symptoms.  You cough up pus.  Your cough does not get better after 2-3 weeks, or your cough gets worse.  You cannot control your cough with suppressant medicines  and you are losing sleep.  You develop pain that is getting worse or pain that is not controlled with pain medicines.  You have a fever.  You have unexplained weight loss.  You have night sweats. Get help right away if:  You cough up blood.  You have difficulty breathing.  Your heartbeat is very fast. This information is not intended to replace advice given to you by your health care provider. Make sure you discuss any questions you have with your health care provider. Document Released: 09/20/2010 Document Revised: 08/30/2015 Document Reviewed: 05/31/2014 Elsevier Interactive Patient Education  2017 [employer]  Increase fluids/rest/vit c OTC Antihistamine daily. OTC Cough medicine (I.e. Delsym) during daytime and Rx cough medication at night only (do not consume alcohol or drive when taking). If cough persists > 10 days then please call clinic.

## 2016-07-22 NOTE — Assessment & Plan Note (Signed)
Increase fluids/rest/vit c OTC Antihistamine daily. OTC Cough medicine (I.e. Delsym) during daytime and Rx cough medication at night only (do not consume alcohol or drive when taking). If cough persists > 10 days then please call clinic.

## 2016-07-22 NOTE — Progress Notes (Signed)
Subjective:    Patient ID: Jacob Harrison, male    DOB: 01-22-1941, 76 y.o.   MRN: [id number]  HPI:  Jacob Harrison presents with non-productive cough that began 2 days ago.  He reports constant cough that worsens at night and is disrupting sleep. He reports only minimal clear nasal drainage that also started Sunday. He has hx of seasonal allergies and spends quite a bit of time outside.  He has not tried any OTC remedies, however has increased fluids. He denies CP/dyspnea/dizziness/HA/fever/night sweats.  Patient Care Team    Relationship Specialty Notifications Start End  Odella Aquas, NP PCP - General Family Medicine  05/20/16     Patient Active Problem List   Diagnosis Date Noted  . Vitamin D deficiency 06/27/2016  . Vitamin B deficiency 06/27/2016  . Hyperlipidemia LDL goal <70 06/21/2016  . Weakness of both lower extremities 06/21/2016  . Diabetes mellitus without complication ([place]) 49/67/5916  . Hypertension associated with diabetes ([place]) 05/20/2016  . Hyperlipidemia associated with type 2 diabetes mellitus ([place]) 05/20/2016  . Coronary artery disease 05/20/2016     Past Medical History:  Diagnosis Date  . BPH (benign prostatic hyperplasia)   . CHF (congestive heart failure) ([place])   . Coronary artery disease   . Diabetes mellitus without complication ([place])   . Hyperlipidemia      Past Surgical History:  Procedure Laterality Date  . CORONARY ARTERY BYPASS GRAFT     x 2 (2001 and redo in late 2011 or early 2012)  . HERNIA REPAIR Left    inguinal  . TONSILLECTOMY AND ADENOIDECTOMY    . TRANSURETHRAL RESECTION OF PROSTATE  1990     Family History  Problem Relation Age of Onset  . Diabetes Mother   . Hyperlipidemia Mother   . Cancer Father     colon  . Alcohol abuse Maternal Uncle   . Diabetes Maternal Uncle   . Hyperlipidemia Maternal Uncle   . Cancer Paternal Uncle     colon     History  Drug Use No     History  Alcohol Use  . 6.6 oz/week  . 6 Cans  of beer, 5 Glasses of wine per week     History  Smoking Status  . Never Smoker  Smokeless Tobacco  . Never Used     Outpatient Encounter Prescriptions as of 07/22/2016  Medication Sig  . aspirin 325 MG EC tablet Take 325 mg by mouth daily.  . Cholecalciferol (VITAMIN D-3 PO) Take 800 Units by mouth daily.  . Coenzyme Q10 (CO Q-10) 200 MG CAPS Take 1 capsule by mouth daily.  . cyanocobalamin 500 MCG tablet Take 500 mcg by mouth daily.  Marland Kitchen ezetimibe (ZETIA) 10 MG tablet Take 1 tablet (10 mg total) by mouth daily.  . finasteride (PROSCAR) 5 MG tablet Take 1 tablet by mouth daily.  . Glucosamine-Chondroit-Vit C-Mn (GLUCOSAMINE 1500 COMPLEX) CAPS Take 1 capsule by mouth daily.  . metFORMIN (GLUCOPHAGE) 500 MG tablet Take 1,000 mg by mouth daily.  . metoprolol tartrate (LOPRESSOR) 25 MG tablet Take 1 tablet by mouth 2 (two) times daily.  . Multiple Vitamin (MULTIVITAMIN) tablet Take 1 tablet by mouth daily.  . nitroGLYCERIN (NITROSTAT) 0.4 MG SL tablet Place 1 tablet under the tongue as needed.  . ranolazine (RANEXA) 1000 MG SR tablet Take 1 tablet (1,000 mg total) by mouth 2 (two) times daily.  . rosuvastatin (CRESTOR) 20 MG tablet Take 1 tablet by mouth at bedtime.  Marland Kitchen  traMADol (ULTRAM) 50 MG tablet Take by mouth every 6 (six) hours as needed.   No facility-administered encounter medications on file as of 07/22/2016.     Allergies: Tetracyclines & related  Body mass index is 35.06 kg/m.  Blood pressure 113/68, pulse (!) 50, temperature 98.8 F (37.1 C), temperature source Oral, height 5' 5.25" (1.657 m), weight 212 lb 4.8 oz (96.3 kg).     Review of Systems  Constitutional: Positive for fatigue. Negative for activity change, appetite change, chills, diaphoresis, fever and unexpected weight change.  HENT: Positive for congestion and postnasal drip. Negative for sinus pressure and sore throat.   Respiratory: Positive for cough. Negative for chest tightness, shortness of breath,  wheezing and stridor.        Nocturnal cough causing insomnia.  Cardiovascular: Negative for chest pain, palpitations and leg swelling.  Gastrointestinal: Negative for diarrhea, nausea and vomiting.  Endocrine: Negative for cold intolerance, heat intolerance, polydipsia, polyphagia and polyuria.  Genitourinary: Negative for difficulty urinating.  Allergic/Immunologic: Negative for immunocompromised state.  Neurological: Negative for dizziness, tremors, weakness, light-headedness and headaches.  Hematological: Does not bruise/bleed easily.  Psychiatric/Behavioral: Positive for sleep disturbance.       Objective:   Physical Exam  Constitutional: He is oriented to person, place, and time. He appears well-developed and well-nourished. No distress.  HENT:  Head: Normocephalic and atraumatic.  Right Ear: External ear normal.  Left Ear: External ear normal.  Pulmonary/Chest: Effort normal and breath sounds normal. No respiratory distress. He has no wheezes. He has no rales. He exhibits no tenderness.  Constant cough during encounter, especially when deep breathing.   Neurological: He is alert and oriented to person, place, and time.  Skin: Skin is warm and dry. No rash noted. He is not diaphoretic. No erythema. No pallor.  Psychiatric: He has a normal mood and affect. His behavior is normal. Judgment and thought content normal.          Assessment & Plan:   1. Cough     Cough Increase fluids/rest/vit c OTC Antihistamine daily. OTC Cough medicine (I.e. Delsym) during daytime and Rx cough medication at night only (do not consume alcohol or drive when taking). If cough persists > 10 days then please call clinic.    FOLLOW-UP:  Return if symptoms worsen or fail to improve.

## 2016-08-04 ENCOUNTER — Encounter: Payer: Self-pay | Admitting: Adult Health

## 2016-08-04 MED ORDER — ROSUVASTATIN CALCIUM 20 MG PO TABS: 20.0000 mg | ORAL_TABLET | Freq: Every day | ORAL | 3 refills | Status: DC

## 2016-08-04 MED ORDER — METOPROLOL TARTRATE 25 MG PO TABS: 25.0000 mg | ORAL_TABLET | Freq: Two times a day (BID) | ORAL | 3 refills | Status: DC

## 2016-08-04 MED ORDER — FINASTERIDE 5 MG PO TABS: 5.0000 mg | ORAL_TABLET | Freq: Every day | ORAL | 3 refills | Status: DC

## 2016-08-04 NOTE — Telephone Encounter (Signed)
We have not prescribed these medications for the patient previously.  Please review and refill if appropriate.  T. Darcy Barbara, CMA  

## 2016-08-06 ENCOUNTER — Telehealth: Payer: Self-pay | Admitting: Adult Health

## 2016-08-06 NOTE — Telephone Encounter (Signed)
error 

## 2016-09-26 ENCOUNTER — Ambulatory Visit (INDEPENDENT_AMBULATORY_CARE_PROVIDER_SITE_OTHER): Payer: Medicare Other | Admitting: Internal Medicine

## 2016-09-26 VITALS — BP 138/72 | HR 72 | Ht 67.0 in | Wt 208.6 lb

## 2016-09-26 DIAGNOSIS — E785 Hyperlipidemia, unspecified: Secondary | ICD-10-CM | POA: Diagnosis not present

## 2016-09-26 DIAGNOSIS — I25118 Atherosclerotic heart disease of native coronary artery with other forms of angina pectoris: Secondary | ICD-10-CM | POA: Diagnosis not present

## 2016-09-26 DIAGNOSIS — I5030 Unspecified diastolic (congestive) heart failure: Secondary | ICD-10-CM | POA: Diagnosis not present

## 2016-09-26 DIAGNOSIS — I10 Essential (primary) hypertension: Secondary | ICD-10-CM | POA: Diagnosis not present

## 2016-09-26 DIAGNOSIS — I255 Ischemic cardiomyopathy: Secondary | ICD-10-CM

## 2016-09-26 MED ORDER — METOPROLOL TARTRATE 50 MG PO TABS: 50.0000 mg | ORAL_TABLET | Freq: Two times a day (BID) | ORAL | 1 refills | Status: DC

## 2016-09-26 NOTE — Progress Notes (Signed)
Follow-up Outpatient Visit Date: 09/26/2016  Primary Care Provider: Esaw Grandchild, NP [place] [address]  Chief Complaint: Follow-up chest pain  HPI:  Jacob Harrison is a 76 y.o. year-old male with history of CAD s/p CABG x 2 (2001 and 2012, HFpEF, DM, HTN, and HLD, who presents for follow-up of chronic chest pain. I last saw him on 06/19/16, at which time reported slight increase in his chronic angina, prompting him to use 10-15 SL NTG tablets per month. We agreed to increase ranolazine to 1000 mg BID. Due to constipation and headaches, he is currently taking 1000 mg in the morning and 500 mg in the evening. His chest pain has improved considerably; he has only used 1 SL NTG in the last 3 weeks. He denies shortness of breath, palpitations, and lightheadedness. His chronic leg edema (L>R) is unchanged. His BP has been well-controlled at home (typically 120's/60's). He denies claudication but notes that his low back pain is gradually returning after a steroid injection ~1 year ago.  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 LAD with sequential 100% and 95-99% mid stenoses. LCx with 40-50% mid stenosis. RCA with 100% proximal and 80-100% sequential distal lesions. RPDA and rPL1 have 99% and 95% stenoses, respectively. SVG->LAD and SVG->rPDA are patent. LVEF 67% with normal wall motion.  LHC (04/05/10, [place], [place]): LMCA with 30% mid stenosis. LAD with 100% midvessel occlution. LCx with 40% mid  stenosis. RCA with 100% proximal stenosis. LIMA->LAD atretic, SVG->LCx occluded, and SVG-> RCA with 80% ostial and 70% distal stenoses.  CV Surgery:  Redo CABG (04/2010, [place], [place]): SVG->LAD and SVG->rPDA.  CABG (2001, [place]): LIMA->LAD, SVG->LCx, and SVG->rPDA  EP Procedures and Devices:  None  Non-Invasive Evaluation(s):  ABIs (03/08/15): Right 1.25, left 1.26; no significant change with exercise.  Exercise myocardial perfusion stress test (10/07/12): Small to moderate in size, mild to moderate in severity, partially reversible defect involving the mid inferior and inferolateral segments. LVEF 65%.  Recent CV Pertinent Labs: Lab Results  Component Value Date   CHOL 169 05/22/2016   HDL 51 05/22/2016   LDLCALC 76 05/22/2016   TRIG 210 (H) 05/22/2016   CHOLHDL 3.3 05/22/2016   K 4.6 05/22/2016   BUN 15 05/22/2016   CREATININE 0.76 05/22/2016    Past medical and surgical history were reviewed and updated in EPIC.  Outpatient Encounter Prescriptions as of 09/26/2016  Medication Sig  . aspirin 325 MG EC tablet Take 325 mg by mouth daily.  . chlorpheniramine-HYDROcodone (TUSSIONEX PENNKINETIC ER) 10-8 MG/5ML SUER Take 5 mLs by mouth at bedtime as needed for cough.  . Cholecalciferol (VITAMIN D-3 PO) Take 800 Units by mouth daily.  . Coenzyme Q10 (CO Q-10) 200 MG CAPS Take 1 capsule by mouth daily.  . cyanocobalamin 500 MCG tablet Take 500 mcg by mouth daily.  Marland Kitchen ezetimibe (ZETIA) 10 MG tablet Take 1 tablet (10 mg total) by mouth daily.  . finasteride (PROSCAR) 5 MG tablet Take 1 tablet (5 mg total) by mouth daily.  . Glucosamine-Chondroit-Vit C-Mn (GLUCOSAMINE 1500 COMPLEX) CAPS Take 1 capsule by mouth daily.  . metFORMIN (GLUCOPHAGE) 500 MG tablet Take 1,000 mg by mouth daily.  Marland Kitchen  metoprolol tartrate (LOPRESSOR) 25 MG tablet Take 1 tablet (25 mg total) by mouth 2 (two) times daily.  . Multiple Vitamin (MULTIVITAMIN) tablet Take 1 tablet by mouth daily.  . nitroGLYCERIN  (NITROSTAT) 0.4 MG SL tablet Place 1 tablet under the tongue as needed.  . ranolazine (RANEXA) 1000 MG SR tablet Take 1 tablet (1,000 mg total) by mouth 2 (two) times daily.  . rosuvastatin (CRESTOR) 20 MG tablet Take 1 tablet (20 mg total) by mouth at bedtime.  . traMADol (ULTRAM) 50 MG tablet Take by mouth every 6 (six) hours as needed.   No facility-administered encounter medications on file as of 09/26/2016.     Allergies: Tetracyclines & related  Social History   Social History  . Marital status: Married    Spouse name: N/A  . Number of children: N/A  . Years of education: N/A   Occupational History  . Not on file.   Social History Main Topics  . Smoking status: Never Smoker  . Smokeless tobacco: Never Used  . Alcohol use 6.6 oz/week    6 Cans of beer, 5 Glasses of wine per week  . Drug use: No  . Sexual activity: Not Currently    Birth control/ protection: None   Other Topics Concern  . Not on file   Social History Narrative  . No narrative on file    Family History  Problem Relation Age of Onset  . Diabetes Mother   . Hyperlipidemia Mother   . Cancer Father        colon  . Alcohol abuse Maternal Uncle   . Diabetes Maternal Uncle   . Hyperlipidemia Maternal Uncle   . Cancer Paternal Uncle        colon    Review of Systems: A 12-system review of systems was performed and was negative except as noted in the HPI.  --------------------------------------------------------------------------------------------------  Physical Exam: BP 138/72   Pulse 72   Ht 5\' 7"  (1.702 m)   Wt 208 lb 9.6 oz (94.6 kg)   BMI 32.67 kg/m   General:  Obese man, seated comfortably in the exam room. HEENT: No conjunctival pallor or scleral icterus.  Moist mucous membranes.  OP clear. Neck: Supple without lymphadenopathy, thyromegaly, JVD, or HJR.  No carotid bruit. Lungs: Normal work of breathing.  Clear to auscultation bilaterally without wheezes or crackles. Heart: Regular  rate and rhythm without murmurs, rubs, or gallops.  Non-displaced PMI. Abd: Bowel sounds present.  Soft, NT/ND without hepatosplenomegaly Ext: Trace pretibial edema bilaterally.  Radial, PT, and DP pulses are 2+ bilaterally. Skin: warm and dry without rash  Lab Results  Component Value Date   WBC 10.3 05/22/2016   HGB 15.1 05/22/2016   HCT 45.9 05/22/2016   MCV 92 05/22/2016   PLT 228 05/22/2016    Lab Results  Component Value Date   NA 139 05/22/2016   K 4.6 05/22/2016   CL 97 05/22/2016   CO2 22 05/22/2016   BUN 15 05/22/2016   CREATININE 0.76 05/22/2016   GLUCOSE 100 (H) 05/22/2016   ALT 35 06/27/2016    Lab Results  Component Value Date   CHOL 169 05/22/2016   HDL 51 05/22/2016   LDLCALC 76 05/22/2016   TRIG 210 (H) 05/22/2016   CHOLHDL 3.3 05/22/2016    --------------------------------------------------------------------------------------------------  ASSESSMENT AND PLAN: Coronary artery disease with stable angina Chest pain has improved significantly with escalation of ranolazine (currently taking 1000 mg in the AM and 500 mg in PM).  Given his history of extensive CAD and most recent assessment of LV function and ischemia ~4 years ago, we have agreed to obtain a TTE. Based on the results, we will proceed with MPI (if LVEF normal) or LHC (if LVEF significant reduced). We will increase metoprolol tartrate to 50 mg BID for antianginal therapy, as well as improved BP control. It is also reasonable to decrease his aspirin to 81 mg daily for secondary prevention, to minimize the risk for bleeding and GI side-effects.  Hypertension Blood pressure is mildly elevated today, given history of DM and CAD. We will increase metoprolol tartrate to 50 mg BID.  Heart failure with preserved ejection fraction and ischemic cardiomyopathy Overall, Jacob Harrison appears euvolemic and well compensated with NYHA class II symptoms. We will obtain an echo, as above, and increase  metoprolol.  Hyperlipidemia Jacob Harrison is tolerating high-intensity statin and ezetimibe well. We will plan to repeat a lipid panel at our next visit to ensure that his LDL is at goal (<70).  Follow-up: Return to clinic in 6 weeks.  Nelva Bush, MD 09/26/2016 4:09 PM

## 2016-09-26 NOTE — Patient Instructions (Addendum)
Medication Instructions:  Increase metoprolol tartrate to 50 mg two times a day.  Decrease aspirin to 81 mg daily. Labwork: None   Testing/Procedures: Your physician has requested that you have an echocardiogram. Echocardiography is a painless test that uses sound waves to create images of your heart. It provides your doctor with information about the size and shape of your heart and how well your heart's chambers and valves are working. This procedure takes approximately one hour. There are no restrictions for this procedure.    Follow-Up: Your physician recommends that you schedule a follow-up appointment in: 6 weeks with Dr End in the [facility].        If you need a refill on your cardiac medications before your next appointment, please call your pharmacy.

## 2016-09-28 ENCOUNTER — Encounter: Payer: Self-pay | Admitting: Internal Medicine

## 2016-09-28 DIAGNOSIS — I5030 Unspecified diastolic (congestive) heart failure: Secondary | ICD-10-CM | POA: Insufficient documentation

## 2016-09-28 DIAGNOSIS — I255 Ischemic cardiomyopathy: Secondary | ICD-10-CM | POA: Insufficient documentation

## 2016-09-28 DIAGNOSIS — I5032 Chronic diastolic (congestive) heart failure: Secondary | ICD-10-CM | POA: Insufficient documentation

## 2016-09-29 ENCOUNTER — Other Ambulatory Visit: Payer: Self-pay

## 2016-09-29 MED ORDER — ROSUVASTATIN CALCIUM 20 MG PO TABS: 20.0000 mg | ORAL_TABLET | Freq: Every day | ORAL | 0 refills | Status: DC

## 2016-10-21 ENCOUNTER — Ambulatory Visit (INDEPENDENT_AMBULATORY_CARE_PROVIDER_SITE_OTHER): Payer: Medicare Other

## 2016-10-21 ENCOUNTER — Other Ambulatory Visit: Payer: Self-pay

## 2016-10-21 DIAGNOSIS — I25118 Atherosclerotic heart disease of native coronary artery with other forms of angina pectoris: Secondary | ICD-10-CM | POA: Diagnosis not present

## 2016-10-21 DIAGNOSIS — I255 Ischemic cardiomyopathy: Secondary | ICD-10-CM

## 2016-10-22 LAB — ECHOCARDIOGRAM COMPLETE
CHL CUP DOP CALC LVOT VTI: 27.3 cm
CHL CUP MV DEC (S): 206
CHL CUP STROKE VOLUME: 46 mL
E/e' ratio: 10.19
EWDT: 206 ms
FS: 38 % (ref 28–44)
IVS/LV PW RATIO, ED: 1.38
LA diam end sys: 40 mm
LA diam index: 1.86 cm/m2
LA vol A4C: 83.8 ml
LA vol: 63.7 mL
LASIZE: 40 mm
LAVOLIN: 29.7 mL/m2
LV E/e'average: 10.19
LV dias vol index: 31 mL/m2
LV e' LATERAL: 10.6 cm/s
LV sys vol: 21 mL (ref 21–61)
LVDIAVOL: 67 mL (ref 62–150)
LVEEMED: 10.19
LVOT area: 2.84 cm2
LVOTD: 19 mm
LVOTSV: 78 mL
LVSYSVOLIN: 10 mL/m2
Lateral S' vel: 10.9 cm/s
MV pk A vel: 86.4 m/s
MVPG: 5 mmHg
MVPKEVEL: 108 m/s
PW: 13 mm — AB (ref 0.6–1.1)
RV TAPSE: 22.5 mm
RV sys press: 20 mmHg
Reg peak vel: 205 cm/s
Simpson's disk: 69
TDI e' lateral: 10.6
TDI e' medial: 8.82
TR max vel: 205 cm/s

## 2016-10-28 ENCOUNTER — Telehealth: Payer: Self-pay | Admitting: *Deleted

## 2016-10-28 DIAGNOSIS — I208 Other forms of angina pectoris: Secondary | ICD-10-CM

## 2016-10-28 NOTE — Telephone Encounter (Signed)
Notes recorded by Nelva Bush, MD on 10/22/2016 at 8:21 AM EDT Please let Mr. Wesche know that his echo shows that his heart is pumping normally. I recommended that we proceed with a pharmacologic myocardial perfusion stress test to evaluate for significant ischemia in the setting of chronic angina. We will follow-up in the office after the stress test, as previously discussed. Thanks.

## 2016-11-04 ENCOUNTER — Telehealth (HOSPITAL_COMMUNITY): Payer: Self-pay | Admitting: *Deleted

## 2016-11-04 NOTE — Telephone Encounter (Signed)
Patient given detailed instructions per Myocardial Perfusion Study Information Sheet for the test on 11/06/16. Patient notified to arrive 15 minutes early and that it is imperative to arrive on time for appointment to keep from having the test rescheduled.  If you need to cancel or reschedule your appointment, please call the office within 24 hours of your appointment. . Patient verbalized understanding. Kirstie Peri

## 2016-11-06 ENCOUNTER — Ambulatory Visit (HOSPITAL_COMMUNITY): Payer: Medicare Other | Attending: Cardiovascular Disease

## 2016-11-06 DIAGNOSIS — I2510 Atherosclerotic heart disease of native coronary artery without angina pectoris: Secondary | ICD-10-CM | POA: Diagnosis not present

## 2016-11-06 DIAGNOSIS — I208 Other forms of angina pectoris: Secondary | ICD-10-CM

## 2016-11-06 DIAGNOSIS — E119 Type 2 diabetes mellitus without complications: Secondary | ICD-10-CM | POA: Diagnosis not present

## 2016-11-06 DIAGNOSIS — R079 Chest pain, unspecified: Secondary | ICD-10-CM | POA: Diagnosis not present

## 2016-11-06 DIAGNOSIS — I10 Essential (primary) hypertension: Secondary | ICD-10-CM | POA: Insufficient documentation

## 2016-11-06 LAB — MYOCARDIAL PERFUSION IMAGING
CHL CUP NUCLEAR SDS: 3
CHL CUP NUCLEAR SRS: 5
CSEPPHR: 67 {beats}/min
LV dias vol: 135 mL (ref 62–150)
LV sys vol: 70 mL
RATE: 0.3
Rest HR: 45 {beats}/min
SSS: 8
TID: 0.99

## 2016-11-06 MED ORDER — TECHNETIUM TC 99M TETROFOSMIN IV KIT
33.0000 | PACK | Freq: Once | INTRAVENOUS | Status: AC | PRN
  Administered 2016-11-06: 33 via INTRAVENOUS
  Filled 2016-11-06: qty 33

## 2016-11-06 MED ORDER — REGADENOSON 0.4 MG/5ML IV SOLN
0.4000 mg | Freq: Once | INTRAVENOUS | Status: AC
  Administered 2016-11-06: 0.4 mg via INTRAVENOUS

## 2016-11-06 MED ORDER — TECHNETIUM TC 99M TETROFOSMIN IV KIT
10.7000 | PACK | Freq: Once | INTRAVENOUS | Status: AC | PRN
  Administered 2016-11-06: 10.7 via INTRAVENOUS
  Filled 2016-11-06: qty 11

## 2016-11-13 ENCOUNTER — Ambulatory Visit (INDEPENDENT_AMBULATORY_CARE_PROVIDER_SITE_OTHER): Payer: Medicare Other | Admitting: Internal Medicine

## 2016-11-13 ENCOUNTER — Encounter: Payer: Self-pay | Admitting: Internal Medicine

## 2016-11-13 VITALS — BP 138/78 | HR 46 | Ht 67.0 in | Wt 207.5 lb

## 2016-11-13 DIAGNOSIS — Z79899 Other long term (current) drug therapy: Secondary | ICD-10-CM

## 2016-11-13 DIAGNOSIS — R001 Bradycardia, unspecified: Secondary | ICD-10-CM | POA: Diagnosis not present

## 2016-11-13 DIAGNOSIS — I255 Ischemic cardiomyopathy: Secondary | ICD-10-CM | POA: Diagnosis not present

## 2016-11-13 DIAGNOSIS — E785 Hyperlipidemia, unspecified: Secondary | ICD-10-CM | POA: Diagnosis not present

## 2016-11-13 DIAGNOSIS — I5030 Unspecified diastolic (congestive) heart failure: Secondary | ICD-10-CM

## 2016-11-13 DIAGNOSIS — I25118 Atherosclerotic heart disease of native coronary artery with other forms of angina pectoris: Secondary | ICD-10-CM | POA: Diagnosis not present

## 2016-11-13 DIAGNOSIS — I10 Essential (primary) hypertension: Secondary | ICD-10-CM

## 2016-11-13 DIAGNOSIS — R5383 Other fatigue: Secondary | ICD-10-CM

## 2016-11-13 MED ORDER — CARVEDILOL 6.25 MG PO TABS: 6.2500 mg | ORAL_TABLET | Freq: Two times a day (BID) | ORAL | 3 refills | Status: DC

## 2016-11-13 NOTE — Progress Notes (Addendum)
Follow-up Outpatient Visit Date: 11/13/2016  Primary Care Provider: Esaw Grandchild, NP [place] [address]  Chief Complaint: Follow-up chest pain  HPI:  Jacob Harrison is a 76 y.o. year-old male with history of CAD s/p CABG x 2 (2001 and 2012, HFpEF, DM, HTN, and HLD, who presents for follow-up coronary artery disease with stable angina. I last saw him on 09/26/16, at which time he reported some improvement in his chest pain with escalation of ranolazine. We agreed to increase metoprolol and obtain TTE and stress test. LVEF was found to be normal by echo; stress test did not show any significant ischemia or scar. Today, Jacob Harrison reports that he had been feeling well up until a few days ago. He has felt more fatigued, though this gradually gets better during the day. His wife notes that her symptoms and has a hard time keeping up when he walks. He will frequently complaining that his legs just feel tired. He does not have any pain in his legs suggestive of claudication. He also denies chest pain, shortness of breath, palpitations, lightheadedness, orthopnea, and PND. Chronic leg edema is unchanged. He has checked his blood pressure at home and notes that it has been normal. However, his heart rate has been somewhat low recently, with readings into the 40s. He remains on rosuvastatin and ezetimibe, which he is tolerating well.  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 LAD with sequential 100% and 95-99% mid stenoses. LCx  with 40-50% mid stenosis. RCA with 100% proximal and 80-100% sequential distal lesions. RPDA and rPL1 have 99% and 95% stenoses, respectively. SVG->LAD and SVG->rPDA are patent. LVEF 67% with normal wall motion.  LHC (04/05/10, [place], [place]): LMCA with 30% mid stenosis. LAD with 100% midvessel occlution. LCx with 40% mid stenosis. RCA with 100% proximal stenosis. LIMA->LAD atretic, SVG->LCx occluded, and SVG-> RCA with 80% ostial and 70% distal stenoses.  CV Surgery:  Redo CABG (04/2010, [place], [place]): SVG->LAD and SVG->rPDA.  CABG (2001, [place]): LIMA->LAD, SVG->LCx, and SVG->rPDA  EP Procedures and Devices:  None  Non-Invasive Evaluation(s):  Pharmacologic MPI (11/06/16): Low risk study without ischemia or scar. LVEF 48% (calculated) but visually appears normal.  TTE (10/21/16): Normal LV size with mild LVH. LVEF 65-70% with normal diastolic function. Mild LA enlargement. Normal RV size and function.  ABIs (03/08/15): Right 1.25, left 1.26; no significant change with exercise.  Exercise myocardial perfusion stress test (10/07/12): Small to moderate in size, mild to moderate in severity, partially reversible defect involving the mid inferior and inferolateral segments. LVEF 65%.  Recent CV Pertinent Labs: Lab Results  Component Value Date   CHOL 169 05/22/2016   HDL 51 05/22/2016   LDLCALC 76 05/22/2016   TRIG 210 (H) 05/22/2016   CHOLHDL 3.3 05/22/2016   K 4.6 05/22/2016   BUN 15 05/22/2016   CREATININE 0.76 05/22/2016    Past medical and surgical history were reviewed and updated in EPIC.  Outpatient Encounter Prescriptions as of 11/13/2016  Medication Sig  . aspirin EC 81 MG tablet Take 1 tablet (81 mg total) by mouth daily.  . chlorpheniramine-HYDROcodone (TUSSIONEX PENNKINETIC ER) 10-8 MG/5ML SUER Take 5 mLs by mouth at  bedtime as needed for cough.  . Cholecalciferol (VITAMIN D-3 PO) Take 800 Units by mouth daily.  . Coenzyme Q10 (CO Q-10) 200 MG CAPS  Take 1 capsule by mouth daily.  . cyanocobalamin 500 MCG tablet Take 500 mcg by mouth daily.  Marland Kitchen ezetimibe (ZETIA) 10 MG tablet Take 1 tablet (10 mg total) by mouth daily.  . finasteride (PROSCAR) 5 MG tablet Take 1 tablet (5 mg total) by mouth daily.  . Glucosamine-Chondroit-Vit C-Mn (GLUCOSAMINE 1500 COMPLEX) CAPS Take 1 capsule by mouth daily.  . metFORMIN (GLUCOPHAGE) 500 MG tablet Take 1,000 mg by mouth daily.  . metoprolol tartrate (LOPRESSOR) 50 MG tablet Take 1 tablet (50 mg total) by mouth 2 (two) times daily.  . Multiple Vitamin (MULTIVITAMIN) tablet Take 1 tablet by mouth daily.  . nitroGLYCERIN (NITROSTAT) 0.4 MG SL tablet Place 1 tablet under the tongue as needed.  . ranolazine (RANEXA) 1000 MG SR tablet Take 1 tablet (1,000 mg total) by mouth 2 (two) times daily.  . rosuvastatin (CRESTOR) 20 MG tablet Take 1 tablet (20 mg total) by mouth at bedtime.  . traMADol (ULTRAM) 50 MG tablet Take by mouth every 6 (six) hours as needed.   No facility-administered encounter medications on file as of 11/13/2016.     Allergies: Tetracyclines & related  Social History   Social History  . Marital status: Married    Spouse name: N/A  . Number of children: N/A  . Years of education: N/A   Occupational History  . Not on file.   Social History Main Topics  . Smoking status: Never Smoker  . Smokeless tobacco: Never Used  . Alcohol use 6.6 oz/week    6 Cans of beer, 5 Glasses of wine per week  . Drug use: No  . Sexual activity: Not Currently    Birth control/ protection: None   Other Topics Concern  . Not on file   Social History Narrative  . No narrative on file    Family History  Problem Relation Age of Onset  . Diabetes Mother   . Hyperlipidemia Mother   . Cancer Father        colon  . Alcohol abuse Maternal Uncle   . Diabetes Maternal Uncle   . Hyperlipidemia Maternal Uncle   . Cancer Paternal Uncle        colon    Review of Systems: A 12-system review of systems  was performed and was negative except as noted in the HPI.  --------------------------------------------------------------------------------------------------  Physical Exam: BP 138/78 (BP Location: Left Arm, Patient Position: Sitting, Cuff Size: Normal)   Pulse (!) 46   Ht 5\' 7"  (1.702 m)   Wt 207 lb 8 oz (94.1 kg)   BMI 32.50 kg/m   General:  Obese woman, seated comfortably in the exam room. He is accompanied by his wife. HEENT: No conjunctival pallor or scleral icterus.  Moist mucous membranes.  OP clear. Neck: Supple without lymphadenopathy, thyromegaly, JVD, or HJR.  No carotid bruit. Lungs: Normal work of breathing.  Clear to auscultation bilaterally without wheezes or crackles. Heart: Bradycardic but regular  rhythm without murmurs, rubs, or gallops.  Non-displaced PMI. Abd: Bowel sounds present.  Soft, NT/ND without hepatosplenomegaly Ext: No lower extremity edema.  Radial, PT, and DP pulses are 2+ bilaterally. Skin: Warm and dry without rash.  EKG: Sinus bradycardia, heart rate 46 bpm. Otherwise normal.  Lab Results  Component Value Date   WBC 10.3 05/22/2016   HGB 15.1 05/22/2016  HCT 45.9 05/22/2016   MCV 92 05/22/2016   PLT 228 05/22/2016    Lab Results  Component Value Date   NA 139 05/22/2016   K 4.6 05/22/2016   CL 97 05/22/2016   CO2 22 05/22/2016   BUN 15 05/22/2016   CREATININE 0.76 05/22/2016   GLUCOSE 100 (H) 05/22/2016   ALT 35 06/27/2016    Lab Results  Component Value Date   CHOL 169 05/22/2016   HDL 51 05/22/2016   LDLCALC 76 05/22/2016   TRIG 210 (H) 05/22/2016   CHOLHDL 3.3 05/22/2016    --------------------------------------------------------------------------------------------------  ASSESSMENT AND PLAN: Coronary artery disease with stable angina Mr. Gladden is doing well with his current antianginal regimen, including ranolazine 1000 mg twice a day. We will discontinue metoprolol and start carvedilol 6.25 mg twice a day, given his  bradycardia and fatigue.  Sinus bradycardia and fatigue I suspect this is due to excessive beta-blockade. As above, we will discontinue metoprolol and start carvedilol 6.25 mg twice a day. Further adjustments may need to be made based on heart rate and blood pressure. I asked this her symptoms and to continue to monitor his blood pressure and heart rate at home. His heart rate remains less than 55, he should contact us so that we can decrease or discontinue carvedilol.  HFpEF and ischemic cardiomyopathy Chronic leg edema is stable. Mr. Maggio otherwise appears euvolemic. His fatigue is nonspecific, and while it could represent heart failure, I am more concerned for symptomatic bradycardia from metoprolol. Recent echo showed preserved LVEF.  Hypertension Blood pressure is borderline elevated. As above, we will switch metoprolol to carvedilol.  Hyperlipidemia Mr. Dredge is tolerating rosuvastatin and ezetimibe. Unfortunately, he is not fasting today. We will have him return at his convenience for a fasting lipid panel and ALT.  Follow-up: Return to clinic in 4 months.  Nelva Bush, MD 11/13/2016 9:00 AM

## 2016-11-13 NOTE — Patient Instructions (Addendum)
Medication Instructions:  Your physician has recommended you make the following change in your medication:  1- STOP TAKING Metoprolol. 2- START TAKING Carvedilol 6.25 mg (1 tablet) by mouth two times a day.   Labwork: Your physician recommends that you return for lab work in: TOMORROW (LIPID, ALT).  - You will need to be FASTING. - Please go to the [facility]. You will check in at the front desk to the right as you walk into the atrium. Valet Parking is offered if needed.    Testing/Procedures: none  Follow-Up: Your physician recommends that you schedule a follow-up appointment in: 4 MONTHS WITH DR END.   If you need a refill on your cardiac medications before your next appointment, please call your pharmacy.

## 2016-11-14 ENCOUNTER — Other Ambulatory Visit
Admission: RE | Admit: 2016-11-14 | Discharge: 2016-11-14 | Disposition: A | Discharge status: Home / Self Care | Payer: Medicare Other | Source: Ambulatory Visit | Attending: Internal Medicine | Admitting: Internal Medicine

## 2016-11-14 DIAGNOSIS — E785 Hyperlipidemia, unspecified: Secondary | ICD-10-CM | POA: Insufficient documentation

## 2016-11-14 DIAGNOSIS — I10 Essential (primary) hypertension: Secondary | ICD-10-CM | POA: Diagnosis not present

## 2016-11-14 DIAGNOSIS — Z79899 Other long term (current) drug therapy: Secondary | ICD-10-CM | POA: Diagnosis not present

## 2016-11-14 LAB — LIPID PANEL
CHOLESTEROL: 140 mg/dL (ref 0–200)
HDL: 51 mg/dL (ref >40)
LDL CALC: 62 mg/dL (ref 0–99)
TRIGLYCERIDES: 136 mg/dL (ref <150)
Total CHOL/HDL Ratio: 2.7 RATIO
VLDL: 27 mg/dL (ref 0–40)

## 2016-11-14 LAB — ALT: ALT: 41 U/L (ref 17–63)

## 2016-11-17 ENCOUNTER — Telehealth: Payer: Self-pay | Admitting: Adult Health

## 2016-11-17 NOTE — Telephone Encounter (Signed)
Pt sent message thru Mychart for appt w/ Katy D--called listed cell# left  VM to call us back to set up appt.--glh

## 2016-11-27 ENCOUNTER — Ambulatory Visit (INDEPENDENT_AMBULATORY_CARE_PROVIDER_SITE_OTHER): Payer: Medicare Other | Admitting: Adult Health

## 2016-11-27 ENCOUNTER — Telehealth: Payer: Self-pay | Admitting: Internal Medicine

## 2016-11-27 ENCOUNTER — Encounter: Payer: Self-pay | Admitting: Adult Health

## 2016-11-27 VITALS — BP 118/71 | HR 56 | Ht 67.0 in | Wt 203.2 lb

## 2016-11-27 DIAGNOSIS — E1169 Type 2 diabetes mellitus with other specified complication: Secondary | ICD-10-CM | POA: Diagnosis not present

## 2016-11-27 DIAGNOSIS — E785 Hyperlipidemia, unspecified: Secondary | ICD-10-CM

## 2016-11-27 DIAGNOSIS — I255 Ischemic cardiomyopathy: Secondary | ICD-10-CM | POA: Diagnosis not present

## 2016-11-27 DIAGNOSIS — Z23 Encounter for immunization: Secondary | ICD-10-CM | POA: Diagnosis not present

## 2016-11-27 DIAGNOSIS — I10 Essential (primary) hypertension: Secondary | ICD-10-CM | POA: Diagnosis not present

## 2016-11-27 DIAGNOSIS — R29898 Other symptoms and signs involving the musculoskeletal system: Secondary | ICD-10-CM

## 2016-11-27 DIAGNOSIS — E119 Type 2 diabetes mellitus without complications: Secondary | ICD-10-CM | POA: Diagnosis not present

## 2016-11-27 LAB — POCT GLYCOSYLATED HEMOGLOBIN (HGB A1C): Hemoglobin A1C: 5.7

## 2016-11-27 MED ORDER — METFORMIN HCL 500 MG PO TABS: 500.0000 mg | ORAL_TABLET | Freq: Every day | ORAL | 2 refills | Status: DC

## 2016-11-27 NOTE — Assessment & Plan Note (Signed)
Last lipid check with cards was WNL, continued on current statin therapy.

## 2016-11-27 NOTE — Assessment & Plan Note (Signed)
05/23/16  A1c 5.9 Today A1c 5.7 Reduced Metformin from 1000mg  to 500mg  daily. Continue regular movement and heart healthy diet.

## 2016-11-27 NOTE — Assessment & Plan Note (Signed)
Previously treated by Ortho with injection therapy that was successful in treating sx's. Orthopedic referral placed.

## 2016-11-27 NOTE — Assessment & Plan Note (Addendum)
BP at goal 118/71, HR 48 HR re-check 56 Toprol was d/c'd at cards and he was started on carvedilol 6.25mg  BID His home RHR has been running 46-mid 50s, advised he should call cards and share HR and that he has had increased fatigue/dizziness.

## 2016-11-27 NOTE — Telephone Encounter (Signed)
Patient c/o low heart rate as low as 46 - 50's  , fatigued without exertion   Patient says his bp is fine avg 120/70   Please call to discuss.

## 2016-11-27 NOTE — Progress Notes (Signed)
Subjective:    Patient ID: Jacob Harrison, male    DOB: February 01, 1941, 76 y.o.   MRN: [id number]  HPI:  Mr. Liddy is here for regular f/u: HTN, HL, T2D.  Due to bradycardia his cardiologist d/c'd toprol and started him on carvedilol 6.25mg  BID.  He was instructed to talke his BP and HR daily and if HR consistently <55 to call cards.  Today HR is 48 and he reports home HR 46-mid 50s.  He also has been experiencing dizziness with position changes and increased fatigue.  He has not called Dr. Bernita Buffy to report continued bradycardia. He continues to eat a heart healthy diet and "moves all day whether is be yeard work or exercise at the gym".   He reports medication compliance, denies SE. His last lipid panel looked great and cards continued her current statin therapy. He reports dramatic reduction in angina and has only required 1 nitrostat in the last 6 months.  Patient Care Team    Relationship Specialty Notifications Start End  Esaw Grandchild, NP PCP - General Family Medicine  05/20/16     Patient Active Problem List   Diagnosis Date Noted  . (HFpEF) heart failure with preserved ejection fraction ([place]) 09/28/2016  . Ischemic cardiomyopathy 09/28/2016  . Cough 07/22/2016  . Vitamin D deficiency 06/27/2016  . Vitamin B deficiency 06/27/2016  . Hyperlipidemia LDL goal <70 06/21/2016  . Weakness of both lower extremities 06/21/2016  . Diabetes mellitus without complication ([place]) 67/34/1937  . Essential hypertension 05/20/2016  . Hyperlipidemia associated with type 2 diabetes mellitus ([place]) 05/20/2016  . Coronary artery disease 05/20/2016     Past Medical History:  Diagnosis Date  . BPH (benign prostatic hyperplasia)   . CHF (congestive heart failure) ([place])   . Coronary artery disease   . Diabetes mellitus without complication ([place])   . Hyperlipidemia      Past Surgical History:  Procedure Laterality Date  . CORONARY ARTERY BYPASS GRAFT     x 2 (2001 and redo in late 2011 or  early 2012)  . HERNIA REPAIR Left    inguinal  . TONSILLECTOMY AND ADENOIDECTOMY    . TRANSURETHRAL RESECTION OF PROSTATE  1990     Family History  Problem Relation Age of Onset  . Diabetes Mother   . Hyperlipidemia Mother   . Cancer Father        colon  . Alcohol abuse Maternal Uncle   . Diabetes Maternal Uncle   . Hyperlipidemia Maternal Uncle   . Cancer Paternal Uncle        colon     History  Drug Use No     History  Alcohol Use  . 6.6 oz/week  . 6 Cans of beer, 5 Glasses of wine per week     History  Smoking Status  . Never Smoker  Smokeless Tobacco  . Never Used     Outpatient Encounter Prescriptions as of 11/27/2016  Medication Sig  . aspirin EC 81 MG tablet Take 1 tablet (81 mg total) by mouth daily.  . carvedilol (COREG) 6.25 MG tablet Take 1 tablet (6.25 mg total) by mouth 2 (two) times daily.  . Cholecalciferol (VITAMIN D-3 PO) Take 800 Units by mouth daily.  . Coenzyme Q10 (CO Q-10) 200 MG CAPS Take 1 capsule by mouth daily.  . cyanocobalamin 500 MCG tablet Take 500 mcg by mouth daily.  Marland Kitchen ezetimibe (ZETIA) 10 MG tablet Take 1 tablet (10 mg total) by mouth daily.  Marland Kitchen  finasteride (PROSCAR) 5 MG tablet Take 1 tablet (5 mg total) by mouth daily.  . Glucosamine-Chondroit-Vit C-Mn (GLUCOSAMINE 1500 COMPLEX) CAPS Take 1 capsule by mouth daily.  . metFORMIN (GLUCOPHAGE) 500 MG tablet Take 1 tablet (500 mg total) by mouth daily.  . Multiple Vitamin (MULTIVITAMIN) tablet Take 1 tablet by mouth daily.  . nitroGLYCERIN (NITROSTAT) 0.4 MG SL tablet Place 1 tablet under the tongue as needed.  . ranolazine (RANEXA) 1000 MG SR tablet Take 1 tablet (1,000 mg total) by mouth 2 (two) times daily.  . rosuvastatin (CRESTOR) 20 MG tablet Take 1 tablet (20 mg total) by mouth at bedtime.  . traMADol (ULTRAM) 50 MG tablet Take by mouth every 6 (six) hours as needed.  . [DISCONTINUED] chlorpheniramine-HYDROcodone (TUSSIONEX PENNKINETIC ER) 10-8 MG/5ML SUER Take 5 mLs by  mouth at bedtime as needed for cough.  . [DISCONTINUED] metFORMIN (GLUCOPHAGE) 500 MG tablet Take 500 mg by mouth daily.   No facility-administered encounter medications on file as of 11/27/2016.     Allergies: Tetracyclines & related  Body mass index is 31.83 kg/m.  Blood pressure 118/71, pulse (!) 56, height 5\' 7"  (1.702 m), weight 203 lb 3.2 oz (92.2 kg).     Review of Systems  Constitutional: Positive for fatigue. Negative for activity change, appetite change, chills, diaphoresis, fever and unexpected weight change.  Eyes: Negative for visual disturbance.  Respiratory: Negative for cough, chest tightness, shortness of breath, wheezing and stridor.   Cardiovascular: Negative for chest pain, palpitations and leg swelling.  Gastrointestinal: Negative for abdominal distention, abdominal pain, blood in stool, constipation, diarrhea, nausea and vomiting.  Endocrine: Negative for cold intolerance, heat intolerance, polydipsia, polyphagia and polyuria.  Genitourinary: Negative for difficulty urinating, flank pain and hematuria.  Musculoskeletal: Positive for arthralgias, back pain and myalgias. Negative for gait problem, joint swelling, neck pain and neck stiffness.  Skin: Negative for color change, pallor, rash and wound.  Neurological: Positive for dizziness and light-headedness. Negative for tremors and weakness.  Hematological: Does not bruise/bleed easily.       Objective:   Physical Exam  Constitutional: He is oriented to person, place, and time. He appears well-developed and well-nourished. No distress.  HENT:  Head: Normocephalic and atraumatic.  Right Ear: External ear normal.  Left Ear: External ear normal.  Eyes: Pupils are equal, round, and reactive to light. Conjunctivae are normal.  Neck: Normal range of motion.  Cardiovascular: Regular rhythm, normal heart sounds and intact distal pulses.  Bradycardia present.   No murmur heard. Pulmonary/Chest: Effort normal and  breath sounds normal. No respiratory distress. He has no wheezes. He has no rales. He exhibits no tenderness.  Neurological: He is alert and oriented to person, place, and time. Coordination normal.  Skin: Skin is warm and dry. No rash noted. He is not diaphoretic. No erythema. No pallor.  Psychiatric: He has a normal mood and affect. His behavior is normal. Judgment and thought content normal.  Nursing note and vitals reviewed.         Assessment & Plan:   1. Diabetes mellitus without complication ([place])   2. Need for pneumococcal vaccination   3. Weakness of both lower extremities   4. Essential hypertension   5. Hyperlipidemia associated with type 2 diabetes mellitus ([place])     Essential hypertension BP at goal 118/71, HR 48 HR re-check 56 Toprol was d/c'd at cards and he was started on carvedilol 6.25mg  BID His home RHR has been running 46-mid 50s, advised he should  call cards and share HR and that he has had increased fatigue/dizziness.  Weakness of both lower extremities Previously treated by Ortho with injection therapy that was successful in treating sx's. Orthopedic referral placed.  Diabetes mellitus without complication ([place]) 1/61/09  A1c 5.9 Today A1c 5.7 Reduced Metformin from 1000mg  to 500mg  daily. Continue regular movement and heart healthy diet.  Hyperlipidemia associated with type 2 diabetes mellitus ([place]) Last lipid check with cards was WNL, continued on current statin therapy.     FOLLOW-UP:  Return in about 3 months (around 02/27/2017) for CPE.

## 2016-11-27 NOTE — Telephone Encounter (Signed)
Please have Jacob Harrison decrease carvedilol to 3.125 mg BID. If he continues to have significant fatigue and bradycardia, he should discontinue carvedilol altogether. Thanks.  Nelva Bush, MD [facility] Pager: [phone]

## 2016-11-27 NOTE — Patient Instructions (Signed)
Heart-Healthy Eating Plan Many factors influence your heart health, including eating and exercise habits. Heart (coronary) risk increases with abnormal blood fat (lipid) levels. Heart-healthy meal planning includes limiting unhealthy fats, increasing healthy fats, and making other small dietary changes. This includes maintaining a healthy body weight to help keep lipid levels within a normal range. What is my plan? Your health care provider recommends that you:  Get no more than __25___% of the total calories in your daily diet from fat.  Limit your intake of saturated fat to less than __5____% of your total calories each day.  Limit the amount of cholesterol in your diet to less than __300__ mg per day.  What types of fat should I choose?  Choose healthy fats more often. Choose monounsaturated and polyunsaturated fats, such as olive oil and canola oil, flaxseeds, walnuts, almonds, and seeds.  Eat more omega-3 fats. Good choices include salmon, mackerel, sardines, tuna, flaxseed oil, and ground flaxseeds. Aim to eat fish at least two times each week.  Limit saturated fats. Saturated fats are primarily found in animal products, such as meats, butter, and cream. Plant sources of saturated fats include palm oil, palm kernel oil, and coconut oil.  Avoid foods with partially hydrogenated oils in them. These contain trans fats. Examples of foods that contain trans fats are stick margarine, some tub margarines, cookies, crackers, and other baked goods. What general guidelines do I need to follow?  Check food labels carefully to identify foods with trans fats or high amounts of saturated fat.  Fill one half of your plate with vegetables and green salads. Eat 4-5 servings of vegetables per day. A serving of vegetables equals 1 cup of raw leafy vegetables,  cup of raw or cooked cut-up vegetables, or  cup of vegetable juice.  Fill one fourth of your plate with whole grains. Look for the word "whole"  as the first word in the ingredient list.  Fill one fourth of your plate with lean protein foods.  Eat 4-5 servings of fruit per day. A serving of fruit equals one medium whole fruit,  cup of dried fruit,  cup of fresh, frozen, or canned fruit, or  cup of 100% fruit juice.  Eat more foods that contain soluble fiber. Examples of foods that contain this type of fiber are apples, broccoli, carrots, beans, peas, and barley. Aim to get 20-30 g of fiber per day.  Eat more home-cooked food and less restaurant, buffet, and fast food.  Limit or avoid alcohol.  Limit foods that are high in starch and sugar.  Avoid fried foods.  Cook foods by using methods other than frying. Baking, boiling, grilling, and broiling are all great options. Other fat-reducing suggestions include: ? Removing the skin from poultry. ? Removing all visible fats from meats. ? Skimming the fat off of stews, soups, and gravies before serving them. ? Steaming vegetables in water or broth.  Lose weight if you are overweight. Losing just 5-10% of your initial body weight can help your overall health and prevent diseases such as diabetes and heart disease.  Increase your consumption of nuts, legumes, and seeds to 4-5 servings per week. One serving of dried beans or legumes equals  cup after being cooked, one serving of nuts equals 1 ounces, and one serving of seeds equals  ounce or 1 tablespoon.  You may need to monitor your salt (sodium) intake, especially if you have high blood pressure. Talk with your health care provider or dietitian to get  more information about reducing sodium. What foods can I eat? Grains  Breads, including [place], white, pita, wheat, raisin, rye, oatmeal, and [place]. Tortillas that are neither fried nor made with lard or trans fat. Low-fat rolls, including hotdog and hamburger buns and English muffins. Biscuits. Muffins. Waffles. Pancakes. Light popcorn. Whole-grain cereals. Flatbread. Melba  toast. Pretzels. Breadsticks. Rusks. Low-fat snacks and crackers, including oyster, saltine, matzo, graham, animal, and rye. Rice and pasta, including brown rice and those that are made with whole wheat. Vegetables All vegetables. Fruits All fruits, but limit coconut. Meats and Other Protein Sources Lean, well-trimmed beef, veal, pork, and lamb. Chicken and [place] without skin. All fish and shellfish. Wild duck, rabbit, pheasant, and venison. Egg whites or low-cholesterol egg substitutes. Dried beans, peas, lentils, and tofu.Seeds and most nuts. Dairy Low-fat or nonfat cheeses, including ricotta, string, and mozzarella. Skim or 1% milk that is liquid, powdered, or evaporated. Buttermilk that is made with low-fat milk. Nonfat or low-fat yogurt. Beverages Mineral water. Diet carbonated beverages. Sweets and Desserts Sherbets and fruit ices. Honey, jam, marmalade, jelly, and syrups. Meringues and gelatins. Pure sugar candy, such as hard candy, jelly beans, gumdrops, mints, marshmallows, and small amounts of dark chocolate. [employer] Eat all sweets and desserts in moderation. Fats and Oils Nonhydrogenated (trans-free) margarines. Vegetable oils, including soybean, sesame, sunflower, olive, peanut, safflower, corn, canola, and cottonseed. Salad dressings or mayonnaise that are made with a vegetable oil. Limit added fats and oils that you use for cooking, baking, salads, and as spreads. Other Cocoa powder. Coffee and tea. All seasonings and condiments. The items listed above may not be a complete list of recommended foods or beverages. Contact your dietitian for more options. What foods are not recommended? Grains Breads that are made with saturated or trans fats, oils, or whole milk. Croissants. Butter rolls. Cheese breads. Sweet rolls. Donuts. Buttered popcorn. Chow mein noodles. High-fat crackers, such as cheese or butter crackers. Meats and Other Protein Sources Fatty meats, such as  hotdogs, short ribs, sausage, spareribs, bacon, ribeye roast or steak, and mutton. High-fat deli meats, such as salami and bologna. Caviar. Domestic duck and goose. Organ meats, such as kidney, liver, sweetbreads, brains, gizzard, chitterlings, and heart. Dairy Cream, sour cream, cream cheese, and creamed cottage cheese. Whole milk cheeses, including blue (bleu), Monterey Jack, [place], [place], American, [place], Swiss, [place], [place], and [place]. Whole or 2% milk that is liquid, evaporated, or condensed. Whole buttermilk. Cream sauce or high-fat cheese sauce. Yogurt that is made from whole milk. Beverages Regular sodas and drinks with added sugar. Sweets and Desserts Frosting. Pudding. Cookies. Cakes other than angel food cake. Candy that has milk chocolate or white chocolate, hydrogenated fat, butter, coconut, or unknown ingredients. Buttered syrups. Full-fat ice cream or ice cream drinks. Fats and Oils Gravy that has suet, meat fat, or shortening. Cocoa butter, hydrogenated oils, palm oil, coconut oil, palm kernel oil. These can often be found in baked products, candy, fried foods, nondairy creamers, and whipped toppings. Solid fats and shortenings, including bacon fat, salt pork, lard, and butter. Nondairy cream substitutes, such as coffee creamers and sour cream substitutes. Salad dressings that are made of unknown oils, cheese, or sour cream. The items listed above may not be a complete list of foods and beverages to avoid. Contact your dietitian for more information. This information is not intended to replace advice given to you by your health care provider. Make sure you discuss any questions you have with your health care  provider. Document Released: 01/01/2008 Document Revised: 10/12/2015 Document Reviewed: 09/15/2013 Elsevier Interactive Patient Education  2017 Elsevier Inc.   Bradycardia, Adult Bradycardia is a slower-than-normal heartbeat. A normal resting heart rate for an adult  ranges from 60 to 100 beats per minute. With bradycardia, the resting heart rate is less than 60 beats per minute. Bradycardia can prevent enough oxygen from reaching certain areas of your body when you are active. It can be serious if it keeps enough oxygen from reaching your brain and other parts of your body. Bradycardia is not a problem for everyone. For some healthy adults, a slow resting heart rate is normal. What are the causes? This condition may be caused by:  A problem with the heart, including: ? A problem with the heart's electrical system, such as a heart block. ? A problem with the heart's natural pacemaker (sinus node). ? Heart disease. ? A heart attack. ? Heart damage. ? A heart infection. ? A heart condition that is present at birth (congenital heart defect).  Certain medicines that treat heart conditions.  Certain conditions, such as hypothyroidism and obstructive sleep apnea.  Problems with the balance of chemicals and other substances, like potassium, in the blood.  What increases the risk? This condition is more likely to develop in adults who:  Are age 90 or older.  Have high blood pressure (hypertension), high cholesterol (hyperlipidemia), or diabetes.  Drink heavily, use tobacco or nicotine products, or use drugs.  Are stressed.  What are the signs or symptoms? Symptoms of this condition include:  Light-headedness.  Feeling faint or fainting.  Fatigue and weakness.  Shortness of breath.  Chest pain (angina).  Drowsiness.  Confusion.  Dizziness.  How is this diagnosed? This condition may be diagnosed based on:  Your symptoms.  Your medical history.  A physical exam.  During the exam, your health care provider will listen to your heartbeat and check your pulse. To confirm the diagnosis, your health care provider may order tests, such as:  Blood tests.  An electrocardiogram (ECG). This test records the heart's electrical activity.  The test can show how fast your heart is beating and whether the heartbeat is steady.  A test in which you wear a portable device (event recorder or Holter monitor) to record your heart's electrical activity while you go about your day.  Anexercise test.  How is this treated? Treatment for this condition depends on the cause of the condition and how severe your symptoms are. Treatment may involve:  Treatment of the underlying condition.  Changing your medicines or how much medicine you take.  Having a small, battery-operated device called a pacemaker implanted under the skin. When bradycardia occurs, this device can be used to increase your heart rate and help your heart to beat in a regular rhythm.  Follow these instructions at home: Lifestyle   Manage any health conditions that contribute to bradycardia as told by your health care provider.  Follow a heart-healthy diet. A nutrition specialist (dietitian) can help to educate you about healthy food options and changes.  Follow an exercise program that is approved by your health care provider.  Maintain a healthy weight.  Try to reduce or manage your stress, such as with yoga or meditation. If you need help reducing stress, ask your health care provider.  Do not use use any products that contain nicotine or tobacco, such as cigarettes and e-cigarettes. If you need help quitting, ask your health care provider.  Do not use  illegal drugs.  Limit alcohol intake to no more than 1 drink per day for nonpregnant women and 2 drinks per day for men. One drink equals 12 oz of beer, 5 oz of wine, or 1 oz of hard liquor. General instructions  Take over-the-counter and prescription medicines only as told by your health care provider.  Keep all follow-up visits as directed by your health care provider. This is important. How is this prevented? In some cases, bradycardia may be prevented by:  Treating underlying medical  problems.  Stopping behaviors or medicines that can trigger the condition.  Contact a health care provider if:  You feel light-headed or dizzy.  You almost faint.  You feel weak or are easily fatigued during physical activity.  You experience confusion or have memory problems. Get help right away if:  You faint.  You have an irregular heartbeat (palpitations).  You have chest pain.  You have trouble breathing. This information is not intended to replace advice given to you by your health care provider. Make sure you discuss any questions you have with your health care provider. Document Released: 12/14/2001 Document Revised: 11/20/2015 Document Reviewed: 09/13/2015  Reduce Metformin dosage from 1000mg  daily to 500mg  since your A1c is 5.7-very well controlled. Continue regular movement and healthy eating. Please call your cardiologist today to inform them of your resting heart rate and increased fatigue. Please schedule full physical in 3 months. GREAT TO SEE YOU! Chartered certified accountant Patient Education  [employer].

## 2016-11-27 NOTE — Telephone Encounter (Signed)
Called patient. He states his heart rate has still been running low.  In the mornings is usually between 46-52. Blood pressure averages 120/70. Today at PCP, his BP 118/71, HR 46. Patient reports feeling very fatigued even upon waking in the morning. If he exerts himself, he gets very tired easily. Denies dizziness, shortness of breath, or chest pain. On 11/13/16, Dr. Saunders Revel discontinued metoprolol and started him on carvedilol. Patient does not remember feeling this fatigued when on metoprolol. Routing to Dr End for advice.

## 2016-11-28 MED ORDER — CARVEDILOL 3.125 MG PO TABS: 3.1250 mg | ORAL_TABLET | Freq: Two times a day (BID) | ORAL | 0 refills | Status: DC

## 2016-11-28 NOTE — Telephone Encounter (Signed)
Spoke with patient and reviewed Dr. Darnelle Bos recommendations to decrease carvedilol to 3.125 mg twice a day and to continue monitoring his symptoms and blood pressure numbers. He verbalized understanding and read back instructions with no further questions or concerns at this time. He was appreciative for the call back and will continue monitoring.

## 2016-12-04 ENCOUNTER — Encounter: Payer: Self-pay | Admitting: Adult Health

## 2016-12-30 ENCOUNTER — Ambulatory Visit (INDEPENDENT_AMBULATORY_CARE_PROVIDER_SITE_OTHER): Payer: Medicare Other

## 2016-12-30 ENCOUNTER — Encounter (INDEPENDENT_AMBULATORY_CARE_PROVIDER_SITE_OTHER): Payer: Self-pay | Admitting: Orthopaedic Surgery

## 2016-12-30 ENCOUNTER — Ambulatory Visit (INDEPENDENT_AMBULATORY_CARE_PROVIDER_SITE_OTHER): Payer: Medicare Other | Admitting: Orthopaedic Surgery

## 2016-12-30 VITALS — BP 144/77 | HR 54 | Ht 67.0 in | Wt 205.0 lb

## 2016-12-30 DIAGNOSIS — M545 Low back pain: Secondary | ICD-10-CM | POA: Diagnosis not present

## 2016-12-30 DIAGNOSIS — I255 Ischemic cardiomyopathy: Secondary | ICD-10-CM | POA: Diagnosis not present

## 2016-12-30 DIAGNOSIS — G8929 Other chronic pain: Secondary | ICD-10-CM

## 2016-12-30 NOTE — Progress Notes (Signed)
Office Visit Note   Patient: Jacob Harrison           Date of Birth: 03/20/1941           MRN: [id number] Visit Date: 12/30/2016              Requested by: Esaw Grandchild, NP [place], [place] [address] PCP: Esaw Grandchild, NP   Assessment & Plan: Visit Diagnoses:  1. Chronic bilateral low back pain, with sciatica presence unspecified      Lumbar splndylosis with partial  ankylosis  Plan: We'll set him up for single epidural a recheck in a month this is not effective we'll need to consider repeating his diagnostic imaging since he was told by the doctors in [place] that he had some stenosis in the lumbar region.  Follow-Up Instructions: No Follow-up on file.   Orders:  Orders Placed This Encounter  Procedures  . XR Lumbar Spine 2-3 Views   No orders of the defined types were placed in this encounter.     Procedures: No procedures performed   Clinical Data: No additional findings.   Subjective: Chief Complaint  Patient presents with  . Lower Back - Pain    HPI 76 year old orally controlled diabetic with low back pain 2 years. He had serious 3 epidurals about 18 months ago in [place] in [place] gave him relie. He states injection started wearing off to 3 months ago he walks with a short stride gait has difficulty with significant hip flexion if he has to step over objects but states he can easily walk a half mile although he walks for a slow with short stride gait. He's had heart surgery bypass surgery status post deep be taking anti-inflammatories but still occasionally takes anti-inflammatory Aleve. Pain is about equal right and left in the buttocks.   Review of Systems posture coronary disease hyperlipidemia. His CBGs normally run 121 up to 140 after the epidural done in the past. History of vitamin D deficiency, heart failure with preserved ejection fraction, ischemic cardiomyopathy. Negative for of foot ulcers or pressure  ulcers.   Objective: Vital Signs: BP (!) 144/77   Pulse (!) 54   Ht 5\' 7"  (1.702 m)   Wt 205 lb (93 kg)   BMI 32.11 kg/m   Physical Exam  Constitutional: He is oriented to person, place, and time. He appears well-developed and well-nourished.  HENT:  Head: Normocephalic and atraumatic.  Eyes: Pupils are equal, round, and reactive to light. EOM are normal.  Neck: No tracheal deviation present. No thyromegaly present.  Cardiovascular: Normal rate.   Pulmonary/Chest: Effort normal. He has no wheezes.  Abdominal: Soft. Bowel sounds are normal.  Neurological: He is alert and oriented to person, place, and time.  Skin: Skin is warm and dry. Capillary refill takes less than 2 seconds.  Psychiatric: He has a normal mood and affect. His behavior is normal. Judgment and thought content normal.    Ortho ExamPatient has good hip range of motion. He has some weakness with hip flexion normal knee range of motion no knee effusion internal rotation both hips 30. Plantar surface of his feet show no calluses and no evidence of the ulcers. Pulses palpable. His limited lumbar 4 flexion and extension.   Specialty Comments:  No specialty comments available.  Imaging: Xr Lumbar Spine 2-3 Views  Result Date: 12/30/2016 AP lateral lumbar x-rays are obtained and reviewed. This shows changes consistent with ankylosing spondylitis with the lateral and  anterior bridging osteophytes the upper and mid level lumbar region. He has some curvature less than 20 the lumbar spine. Facet arthropathy is noted. Impression: Lumbar disc degeneration with ankylosis and significant endplate spurring laterally and anteriorly at multiple levels    PMFS History: Patient Active Problem List   Diagnosis Date Noted  . (HFpEF) heart failure with preserved ejection fraction ([place]) 09/28/2016  . Ischemic cardiomyopathy 09/28/2016  . Cough 07/22/2016  . Vitamin D deficiency 06/27/2016  . Vitamin B deficiency 06/27/2016  .  Hyperlipidemia LDL goal <70 06/21/2016  . Weakness of both lower extremities 06/21/2016  . Diabetes mellitus without complication ([place]) 40/81/4481  . Essential hypertension 05/20/2016  . Hyperlipidemia associated with type 2 diabetes mellitus ([place]) 05/20/2016  . Coronary artery disease 05/20/2016   Past Medical History:  Diagnosis Date  . BPH (benign prostatic hyperplasia)   . CHF (congestive heart failure) ([facility] [place])   . Coronary artery disease   . Diabetes mellitus without complication ([place])   . Hyperlipidemia     Family History  Problem Relation Age of Onset  . Diabetes Mother   . Hyperlipidemia Mother   . Cancer Father        colon  . Alcohol abuse Maternal Uncle   . Diabetes Maternal Uncle   . Hyperlipidemia Maternal Uncle   . Cancer Paternal Uncle        colon    Past Surgical History:  Procedure Laterality Date  . CORONARY ARTERY BYPASS GRAFT     x 2 (2001 and redo in late 2011 or early 2012)  . HERNIA REPAIR Left    inguinal  . TONSILLECTOMY AND ADENOIDECTOMY    . TRANSURETHRAL RESECTION OF PROSTATE  1990   Social History   Occupational History  . Not on file.   Social History Main Topics  . Smoking status: Never Smoker  . Smokeless tobacco: Never Used  . Alcohol use 6.6 oz/week    6 Cans of beer, 5 Glasses of wine per week  . Drug use: No  . Sexual activity: Not Currently    Birth control/ protection: None

## 2016-12-30 NOTE — Addendum Note (Signed)
Addended by: Meyer Cory on: 12/30/2016 01:53 PM   Modules accepted: Orders

## 2017-01-02 ENCOUNTER — Other Ambulatory Visit: Payer: Self-pay

## 2017-01-02 MED ORDER — CARVEDILOL 3.125 MG PO TABS: 3.1250 mg | ORAL_TABLET | Freq: Two times a day (BID) | ORAL | 3 refills | Status: DC

## 2017-01-15 ENCOUNTER — Encounter (INDEPENDENT_AMBULATORY_CARE_PROVIDER_SITE_OTHER): Payer: Self-pay | Admitting: Physical Medicine and Rehabilitation

## 2017-01-15 ENCOUNTER — Ambulatory Visit (INDEPENDENT_AMBULATORY_CARE_PROVIDER_SITE_OTHER): Payer: Medicare Other | Admitting: Physical Medicine and Rehabilitation

## 2017-01-15 ENCOUNTER — Ambulatory Visit (INDEPENDENT_AMBULATORY_CARE_PROVIDER_SITE_OTHER): Payer: Medicare Other

## 2017-01-15 VITALS — BP 135/63

## 2017-01-15 DIAGNOSIS — M5441 Lumbago with sciatica, right side: Secondary | ICD-10-CM

## 2017-01-15 DIAGNOSIS — M5416 Radiculopathy, lumbar region: Secondary | ICD-10-CM

## 2017-01-15 DIAGNOSIS — G8929 Other chronic pain: Secondary | ICD-10-CM | POA: Diagnosis not present

## 2017-01-15 DIAGNOSIS — M5442 Lumbago with sciatica, left side: Secondary | ICD-10-CM

## 2017-01-15 MED ORDER — LIDOCAINE HCL (PF) 1 % IJ SOLN
2.0000 mL | Freq: Once | INTRAMUSCULAR | Status: AC
  Administered 2017-01-15: 2 mL

## 2017-01-15 MED ORDER — BETAMETHASONE SOD PHOS & ACET 6 (3-3) MG/ML IJ SUSP
12.0000 mg | Freq: Once | INTRAMUSCULAR | Status: AC
  Administered 2017-01-15: 12 mg

## 2017-01-15 NOTE — Patient Instructions (Signed)

## 2017-01-15 NOTE — Progress Notes (Deleted)
Lower back pain radiates sometime in the thigh on both sides, allergy to tetracycline only, no allery to contrast dye. Gets worse when gets up in the am, after stretches it feels better, or if sits long time is painful. Is taking baby aspirin today.

## 2017-01-21 NOTE — Procedures (Signed)
[name] is a 76 year old gentleman followed by Dr. Lorin Mercy. He is felt conservative care continues to have low back pain and radicular pain without relief. Dr. Lorin Mercy requested one time epidural injection diagnostically and therapeuticallyThe injection  will be diagnostic and hopefully therapeutic. The patient has failed conservative care including time, medications and activity modification.  Lumbar Epidural Steroid Injection - Interlaminar Approach with Fluoroscopic Guidance  Patient: Jacob Harrison      Date of Birth: 08-08-1940 MRN: [id number] PCP: Esaw Grandchild, NP      Visit Date: 01/15/2017   Universal Protocol:     Consent Given By: the patient  Position: PRONE  Additional Comments: Vital signs were monitored before and after the procedure. Patient was prepped and draped in the usual sterile fashion. The correct patient, procedure, and site was verified.   Injection Procedure Details:  Procedure Site One Meds Administered:  Meds ordered this encounter  Medications  . lidocaine (PF) (XYLOCAINE) 1 % injection 2 mL  . betamethasone acetate-betamethasone sodium phosphate (CELESTONE) injection 12 mg     Laterality: Bilateral  Location/Site:  L5-S1  Needle size: 20 G  Needle type: Tuohy  Needle Placement: Paramedian epidural  Findings:  -Contrast Used: 0.5 mL iohexol 180 mg iodine/mL   -Comments: Excellent flow of contrast into the epidural space.  Procedure Details: Using a paramedian approach from the side mentioned above, the region overlying the inferior lamina was localized under fluoroscopic visualization and the soft tissues overlying this structure were infiltrated with 4 ml. of 1% Lidocaine without Epinephrine. The Tuohy needle was inserted into the epidural space using a paramedian approach.   The epidural space was localized using loss of resistance along with lateral and bi-planar fluoroscopic views.  After negative aspirate for air, blood, and CSF, a  2 ml. volume of Isovue-250 was injected into the epidural space and the flow of contrast was observed. Radiographs were obtained for documentation purposes.    The injectate was administered into the level noted above.   Additional Comments:  The patient tolerated the procedure well Dressing: Band-Aid    Post-procedure details: Patient was observed during the procedure. Post-procedure instructions were reviewed.  Patient left the clinic in stable condition.

## 2017-01-27 ENCOUNTER — Other Ambulatory Visit: Payer: Self-pay | Admitting: Adult Health

## 2017-02-03 ENCOUNTER — Ambulatory Visit (INDEPENDENT_AMBULATORY_CARE_PROVIDER_SITE_OTHER): Payer: Medicare Other | Admitting: Orthopaedic Surgery

## 2017-02-03 ENCOUNTER — Encounter (INDEPENDENT_AMBULATORY_CARE_PROVIDER_SITE_OTHER): Payer: Self-pay | Admitting: Orthopaedic Surgery

## 2017-02-03 VITALS — HR 52 | Ht 67.0 in | Wt 205.0 lb

## 2017-02-03 DIAGNOSIS — I255 Ischemic cardiomyopathy: Secondary | ICD-10-CM | POA: Diagnosis not present

## 2017-02-03 DIAGNOSIS — M545 Low back pain, unspecified: Secondary | ICD-10-CM

## 2017-02-03 NOTE — Progress Notes (Signed)
Office Visit Note   Patient: Jacob Harrison           Date of Birth: 1941/03/04           MRN: [id number] Visit Date: 02/03/2017              Requested by: Esaw Grandchild, NP [place], [place] [address] PCP: Esaw Grandchild, NP   Assessment & Plan: Visit Diagnoses:  1. Low back pain without sciatica, unspecified back pain laterality, unspecified chronicity     Plan: Patient is gotten very good relief with epidural. We'll check him back if he has increased symptoms. He will call in a few months of gets recurrent symptoms and would like to have another injection. We discussed the importance of working on weight loss getting back involved with silver sneakers. He usually has lost about 10 pounds during the summer but then tends to gain weight in the winter. He is working on walking and stretching program currently as recommended.  Follow-Up Instructions: Return if symptoms worsen or fail to improve.   Orders:  No orders of the defined types were placed in this encounter.  No orders of the defined types were placed in this encounter.     Procedures: No procedures performed   Clinical Data: No additional findings.   Subjective: Chief Complaint  Patient presents with  . Lower Back - Pain, Follow-up    HPI 76 year old male returns post epidural injection lumbar on 01/15/2017. States he got great relief from the injection his leg pain is gone. He has some discomfort he sits for long period time he is moving better. He did not have problems with hyperglycemia after the injection. We discussed working on the weight loss with the BMI of 32.11.  Review of Systems 14 point review of systems updated and is unchanged. Of note is positive history for diabetes ,coronary artery disease.   Objective: Vital Signs: Pulse (!) 52   Ht 5\' 7"  (1.702 m)   Wt 205 lb (93 kg)   BMI 32.11 kg/m   Physical Exam  Constitutional: He is oriented to person, place, and time. He  appears well-developed and well-nourished.  HENT:  Head: Normocephalic and atraumatic.  Eyes: Pupils are equal, round, and reactive to light. EOM are normal.  Neck: No tracheal deviation present. No thyromegaly present.  Cardiovascular: Normal rate.   Pulmonary/Chest: Effort normal. He has no wheezes.  Abdominal: Soft. Bowel sounds are normal.  Neurological: He is alert and oriented to person, place, and time.  Skin: Skin is warm and dry. Capillary refill takes less than 2 seconds.  Psychiatric: He has a normal mood and affect. His behavior is normal. Judgment and thought content normal.    Ortho Exam patient is ambulatory with normal heel toe gait. No trochanteric bursal tenderness no rash over exposed skin normal hip range of motion. Anterior tib EHL is intact. Plantar surfaces and intact , no ulceration.  Specialty Comments:  No specialty comments available.  Imaging: No results found.   PMFS History: Patient Active Problem List   Diagnosis Date Noted  . (HFpEF) heart failure with preserved ejection fraction ([place]) 09/28/2016  . Ischemic cardiomyopathy 09/28/2016  . Cough 07/22/2016  . Vitamin D deficiency 06/27/2016  . Vitamin B deficiency 06/27/2016  . Hyperlipidemia LDL goal <70 06/21/2016  . Weakness of both lower extremities 06/21/2016  . Diabetes mellitus without complication ([facility]) 37/16/9678  . Essential hypertension 05/20/2016  . Hyperlipidemia associated with type 2  diabetes mellitus ([place] [facility]) 05/20/2016  . Coronary artery disease 05/20/2016   Past Medical History:  Diagnosis Date  . BPH (benign prostatic hyperplasia)   . CHF (congestive heart failure) ([place])   . Coronary artery disease   . Diabetes mellitus without complication ([facility])   . Hyperlipidemia     Family History  Problem Relation Age of Onset  . Diabetes Mother   . Hyperlipidemia Mother   . Cancer Father        colon  . Alcohol abuse Maternal Uncle   . Diabetes Maternal Uncle   . Hyperlipidemia  Maternal Uncle   . Cancer Paternal Uncle        colon    Past Surgical History:  Procedure Laterality Date  . CORONARY ARTERY BYPASS GRAFT     x 2 (2001 and redo in late 2011 or early 2012)  . HERNIA REPAIR Left    inguinal  . TONSILLECTOMY AND ADENOIDECTOMY    . TRANSURETHRAL RESECTION OF PROSTATE  1990   Social History   Occupational History  . Not on file.   Social History Main Topics  . Smoking status: Never Smoker  . Smokeless tobacco: Never Used  . Alcohol use 6.6 oz/week    6 Cans of beer, 5 Glasses of wine per week  . Drug use: No  . Sexual activity: Not Currently    Birth control/ protection: None

## 2017-02-05 DIAGNOSIS — H524 Presbyopia: Secondary | ICD-10-CM | POA: Diagnosis not present

## 2017-02-05 DIAGNOSIS — E119 Type 2 diabetes mellitus without complications: Secondary | ICD-10-CM | POA: Diagnosis not present

## 2017-02-05 LAB — HM DIABETES EYE EXAM

## 2017-02-25 ENCOUNTER — Encounter: Payer: Self-pay | Admitting: Adult Health

## 2017-02-25 ENCOUNTER — Ambulatory Visit (INDEPENDENT_AMBULATORY_CARE_PROVIDER_SITE_OTHER): Payer: Medicare Other | Admitting: Adult Health

## 2017-02-25 VITALS — BP 118/73 | HR 52 | Ht 67.0 in | Wt 210.3 lb

## 2017-02-25 DIAGNOSIS — K439 Ventral hernia without obstruction or gangrene: Secondary | ICD-10-CM | POA: Insufficient documentation

## 2017-02-25 DIAGNOSIS — I10 Essential (primary) hypertension: Secondary | ICD-10-CM | POA: Diagnosis not present

## 2017-02-25 DIAGNOSIS — Z1211 Encounter for screening for malignant neoplasm of colon: Secondary | ICD-10-CM | POA: Insufficient documentation

## 2017-02-25 DIAGNOSIS — Z23 Encounter for immunization: Secondary | ICD-10-CM

## 2017-02-25 DIAGNOSIS — E119 Type 2 diabetes mellitus without complications: Secondary | ICD-10-CM

## 2017-02-25 LAB — POC HEMOCCULT BLD/STL (OFFICE/1-CARD/DIAGNOSTIC): FECAL OCCULT BLD: NEGATIVE

## 2017-02-25 LAB — POCT GLYCOSYLATED HEMOGLOBIN (HGB A1C): Hemoglobin A1C: 6

## 2017-02-25 NOTE — Assessment & Plan Note (Signed)
A1c today- 6.0 Home AM BS- 110s, denies episodes of hypoglycemia Continue Metformin 500mg  daily and low CHO/sugar Continue walking regime

## 2017-02-25 NOTE — Assessment & Plan Note (Signed)
BP at goal 118/73, HR 52 Managed by cards

## 2017-02-25 NOTE — Assessment & Plan Note (Signed)
Guaiac test-neg IFOBT cards provided.

## 2017-02-25 NOTE — Assessment & Plan Note (Addendum)
Large, reducible, non-tender ventral hernia.  Has been present for >5 years Instructed if hernia becomes non-reducable to seek immediate medical assistance. He verbalized understanding/agreement

## 2017-02-25 NOTE — Progress Notes (Signed)
Subjective:    Patient ID: Jacob Harrison, male    DOB: [birth year], 76 y.o.   MRN: [id number]  HPI:  11/27/16 OV: Jacob Harrison is here for regular f/u: HTN, HL, T2D.  Due to bradycardia his cardiologist d/c'd toprol and started him on carvedilol 6.25mg  BID.  He was instructed to talke his BP and HR daily and if HR consistently <55 to call cards.  Today HR is 48 and he reports home HR 46-mid 50s.  He also has been experiencing dizziness with position changes and increased fatigue.  He has not called Dr. Bernita Buffy to report continued bradycardia. He continues to eat a heart healthy diet and "moves all day whether is be yeard work or exercise at the gym".   He reports medication compliance, denies SE. His last lipid panel looked great and cards continued her current statin therapy. He reports dramatic reduction in angina and has only required 1 nitrostat in the last 6 months.  02/25/17 OV: Jacob Harrison is here for CPE. Due to bradycardia, cards instructed him to decrease carvedilol to 3.125mg  BID.  Then his BP increased and was consistently above goal and cards bumped carvedilol back to 6 mg BID.  BP improved and HR has remained >50.  Home HR 50-55 and dizziness has completely resolved. Home AM BS 110s, denies episodes of hypoglycemia.  He walks .5-1 mile 3-4 days week. He tries to follow low saturated fat/CHO/sugar diet and continues to abstain from tobacco use.  He enjoys whiskey/water 1-2 day.  Healthcare Maintenance: Colonoscopy- Not due until 2024, IFOBT cards provided today. AAA Screening- he has never smoked, therefore screening is not recommended.  Patient Care Team    Relationship Specialty Notifications Start End  Esaw Grandchild, NP PCP - General Family Medicine  05/20/16     Patient Active Problem List   Diagnosis Date Noted  . (HFpEF) heart failure with preserved ejection fraction ([place]) 09/28/2016  . Ischemic cardiomyopathy 09/28/2016  . Cough 07/22/2016  . Vitamin D deficiency  06/27/2016  . Vitamin B deficiency 06/27/2016  . Hyperlipidemia LDL goal <70 06/21/2016  . Weakness of both lower extremities 06/21/2016  . Diabetes mellitus without complication ([facility]) 44/06/4740  . Essential hypertension 05/20/2016  . Hyperlipidemia associated with type 2 diabetes mellitus ([facility]) 05/20/2016  . Coronary artery disease 05/20/2016     Past Medical History:  Diagnosis Date  . BPH (benign prostatic hyperplasia)   . CHF (congestive heart failure) ([place])   . Coronary artery disease   . Diabetes mellitus without complication ([facility])   . Hyperlipidemia      Past Surgical History:  Procedure Laterality Date  . CORONARY ARTERY BYPASS GRAFT     x 2 (2001 and redo in late 2011 or early 2012)  . HERNIA REPAIR Left    inguinal  . TONSILLECTOMY AND ADENOIDECTOMY    . TRANSURETHRAL RESECTION OF PROSTATE  1990     Family History  Problem Relation Age of Onset  . Diabetes Mother   . Hyperlipidemia Mother   . Cancer Father        colon  . Alcohol abuse Maternal Uncle   . Diabetes Maternal Uncle   . Hyperlipidemia Maternal Uncle   . Cancer Paternal Uncle        colon     Social History   Substance and Sexual Activity  Drug Use No     Social History   Substance and Sexual Activity  Alcohol Use Yes  . Alcohol/week: 6.6 oz  .  Types: 6 Cans of beer, 5 Glasses of wine per week     Social History   Tobacco Use  Smoking Status Never Smoker  Smokeless Tobacco Never Used     Outpatient Encounter Medications as of 02/25/2017  Medication Sig  . aspirin EC 81 MG tablet Take 1 tablet (81 mg total) by mouth daily.  . carvedilol (COREG) 3.125 MG tablet Take 1 tablet (3.125 mg total) by mouth 2 (two) times daily with a meal.  . Cholecalciferol (VITAMIN D-3 PO) Take 800 Units by mouth daily.  . Coenzyme Q10 (CO Q-10) 200 MG CAPS Take 1 capsule by mouth daily.  . cyanocobalamin 500 MCG tablet Take 500 mcg by mouth daily.  Marland Kitchen ezetimibe (ZETIA) 10 MG tablet Take 1  tablet (10 mg total) by mouth daily.  . finasteride (PROSCAR) 5 MG tablet TAKE 1 TABLET BY MOUTH ONCE DAILY  . Glucosamine-Chondroit-Vit C-Mn (GLUCOSAMINE 1500 COMPLEX) CAPS Take 1 capsule by mouth daily.  . metFORMIN (GLUCOPHAGE) 500 MG tablet Take 1 tablet (500 mg total) by mouth daily.  . Multiple Vitamin (MULTIVITAMIN) tablet Take 1 tablet by mouth daily.  . nitroGLYCERIN (NITROSTAT) 0.4 MG SL tablet Place 1 tablet under the tongue as needed.  . ranolazine (RANEXA) 1000 MG SR tablet Take 1 tablet (1,000 mg total) by mouth 2 (two) times daily.  . rosuvastatin (CRESTOR) 20 MG tablet Take 1 tablet (20 mg total) by mouth at bedtime.  . traMADol (ULTRAM) 50 MG tablet Take by mouth every 6 (six) hours as needed.   No facility-administered encounter medications on file as of 02/25/2017.     Allergies: Tetracyclines & related  There is no height or weight on file to calculate BMI.  There were no vitals taken for this visit.     Review of Systems  Constitutional: Positive for fatigue. Negative for activity change, appetite change, chills, diaphoresis, fever and unexpected weight change.  Eyes: Negative for visual disturbance.  Respiratory: Negative for cough, chest tightness, shortness of breath, wheezing and stridor.   Cardiovascular: Negative for chest pain, palpitations and leg swelling.  Gastrointestinal: Negative for abdominal distention, abdominal pain, blood in stool, constipation, diarrhea, nausea and vomiting.  Endocrine: Negative for cold intolerance, heat intolerance, polydipsia, polyphagia and polyuria.  Genitourinary: Negative for difficulty urinating, flank pain and hematuria.  Musculoskeletal: Positive for arthralgias, back pain and myalgias. Negative for gait problem, joint swelling, neck pain and neck stiffness.  Skin: Negative for color change, pallor, rash and wound.  Neurological: Negative for dizziness, tremors, weakness and light-headedness.  Hematological: Does not  bruise/bleed easily.  Psychiatric/Behavioral: Negative for decreased concentration, hallucinations, self-injury, sleep disturbance and suicidal ideas. The patient is not nervous/anxious and is not hyperactive.        Objective:   Physical Exam  Constitutional: He is oriented to person, place, and time. He appears well-developed and well-nourished. No distress.  HENT:  Head: Normocephalic and atraumatic.  Right Ear: External ear normal.  Left Ear: External ear normal.  Eyes: Conjunctivae are normal. Pupils are equal, round, and reactive to light.  Neck: Normal range of motion.  Cardiovascular: Regular rhythm, normal heart sounds and intact distal pulses. Bradycardia present.  No murmur heard. Pulmonary/Chest: Effort normal and breath sounds normal. No respiratory distress. He has no wheezes. He has no rales. He exhibits no tenderness.  Abdominal: Soft. Bowel sounds are normal. He exhibits no distension and no mass. There is no tenderness. There is no rebound, no guarding and no CVA tenderness. A hernia  is present. Hernia confirmed positive in the ventral area.  Large, reducible, non-tender ventral hernia.  Has been present for >5 years  Genitourinary: Rectal exam shows guaiac negative stool.  Genitourinary Comments: Chaperone present during examination.  Neurological: He is alert and oriented to person, place, and time. Coordination normal.  Skin: Skin is warm and dry. No rash noted. He is not diaphoretic. No erythema. No pallor.  Psychiatric: He has a normal mood and affect. His behavior is normal. Judgment and thought content normal.  Nursing note and vitals reviewed.         Assessment & Plan:   1. Need for influenza vaccination   2. Screening for colon cancer   3. Diabetes mellitus without complication ([place])   4. Ventral hernia without obstruction or gangrene   5. Essential hypertension     Diabetes mellitus without complication ([facility]) K8L today- 6.0 Home AM BS- 110s,  denies episodes of hypoglycemia Continue Metformin 500mg  daily and low CHO/sugar Continue walking regime  Essential hypertension BP at goal 118/73, HR 52 Managed by cards  Screening for colon cancer Guaiac test-neg IFOBT cards provided.  Ventral hernia without obstruction or gangrene Large, reducible, non-tender ventral hernia.  Has been present for >5 years Instructed if hernia becomes non-reducable to seek immediate medical assistance. He verbalized understanding/agreement    FOLLOW-UP:  Return in about 4 months (around 06/25/2017) for Regular Follow Up, HTN, Hypercholestermia, Diabetes. No diagnosis found.  No problem-specific Assessment & Plan notes found for this encounter.    FOLLOW-UP:  No Follow-up on file.

## 2017-02-25 NOTE — Patient Instructions (Signed)
Diabetes Mellitus and Food It is important for you to manage your blood sugar (glucose) level. Your blood glucose level can be greatly affected by what you eat. Eating healthier foods in the appropriate amounts throughout the day at about the same time each day will help you control your blood glucose level. It can also help slow or prevent worsening of your diabetes mellitus. Healthy eating may even help you improve the level of your blood pressure and reach or maintain a healthy weight. General recommendations for healthful eating and cooking habits include:  Eating meals and snacks regularly. Avoid going long periods of time without eating to lose weight.  Eating a diet that consists mainly of plant-based foods, such as fruits, vegetables, nuts, legumes, and whole grains.  Using low-heat cooking methods, such as baking, instead of high-heat cooking methods, such as deep frying.  Work with your dietitian to make sure you understand how to use the Nutrition Facts information on food labels. How can food affect me? Carbohydrates Carbohydrates affect your blood glucose level more than any other type of food. Your dietitian will help you determine how many carbohydrates to eat at each meal and teach you how to count carbohydrates. Counting carbohydrates is important to keep your blood glucose at a healthy level, especially if you are using insulin or taking certain medicines for diabetes mellitus. Alcohol Alcohol can cause sudden decreases in blood glucose (hypoglycemia), especially if you use insulin or take certain medicines for diabetes mellitus. Hypoglycemia can be a life-threatening condition. Symptoms of hypoglycemia (sleepiness, dizziness, and disorientation) are similar to symptoms of having too much alcohol. If your health care provider has given you approval to drink alcohol, do so in moderation and use the following guidelines:  Women should not have more than one drink per day, and men  should not have more than two drinks per day. One drink is equal to: ? 12 oz of beer. ? 5 oz of wine. ? 1 oz of hard liquor.  Do not drink on an empty stomach.  Keep yourself hydrated. Have water, diet soda, or unsweetened iced tea.  Regular soda, juice, and other mixers might contain a lot of carbohydrates and should be counted.  What foods are not recommended? As you make food choices, it is important to remember that all foods are not the same. Some foods have fewer nutrients per serving than other foods, even though they might have the same number of calories or carbohydrates. It is difficult to get your body what it needs when you eat foods with fewer nutrients. Examples of foods that you should avoid that are high in calories and carbohydrates but low in nutrients include:  Trans fats (most processed foods list trans fats on the Nutrition Facts label).  Regular soda.  Juice.  Candy.  Sweets, such as cake, pie, doughnuts, and cookies.  Fried foods.  What foods can I eat? Eat nutrient-rich foods, which will nourish your body and keep you healthy. The food you should eat also will depend on several factors, including:  The calories you need.  The medicines you take.  Your weight.  Your blood glucose level.  Your blood pressure level.  Your cholesterol level.  You should eat a variety of foods, including:  Protein. ? Lean cuts of meat. ? Proteins low in saturated fats, such as fish, egg whites, and beans. Avoid processed meats.  Fruits and vegetables. ? Fruits and vegetables that may help control blood glucose levels, such as apples,   mangoes, and yams.  Dairy products. ? Choose fat-free or low-fat dairy products, such as milk, yogurt, and cheese.  Grains, bread, pasta, and rice. ? Choose whole grain products, such as multigrain bread, whole oats, and brown rice. These foods may help control blood pressure.  Fats. ? Foods containing healthful fats, such as  nuts, avocado, olive oil, canola oil, and fish.  Does everyone with diabetes mellitus have the same meal plan? Because every person with diabetes mellitus is different, there is not one meal plan that works for everyone. It is very important that you meet with a dietitian who will help you create a meal plan that is just right for you. This information is not intended to replace advice given to you by your health care provider. Make sure you discuss any questions you have with your health care provider. Document Released: 12/19/2004 Document Revised: 08/30/2015 Document Reviewed: 02/18/2013 Elsevier Interactive Patient Education  2017 [place] all medications as directed. Increase water intake, strive for at least 60 ounces/day.   Follow Heart Healthy diet Increase regular exercise.  Recommend at least 30 minutes daily, 5 days per week of walking, jogging, biking, swimming, YouTube/Pinterest workout videos. Please follow-up in 4 months, sooner if needed. HAPPY HOLIDAYS! NICE TO SEE YOU!

## 2017-03-03 ENCOUNTER — Other Ambulatory Visit: Payer: Self-pay | Admitting: Internal Medicine

## 2017-03-03 DIAGNOSIS — I25118 Atherosclerotic heart disease of native coronary artery with other forms of angina pectoris: Secondary | ICD-10-CM

## 2017-03-18 ENCOUNTER — Encounter: Payer: Self-pay | Admitting: Internal Medicine

## 2017-03-18 ENCOUNTER — Ambulatory Visit (INDEPENDENT_AMBULATORY_CARE_PROVIDER_SITE_OTHER): Payer: Medicare Other | Admitting: Internal Medicine

## 2017-03-18 VITALS — BP 130/72 | HR 69 | Ht 67.0 in | Wt 213.2 lb

## 2017-03-18 DIAGNOSIS — I5030 Unspecified diastolic (congestive) heart failure: Secondary | ICD-10-CM

## 2017-03-18 DIAGNOSIS — I10 Essential (primary) hypertension: Secondary | ICD-10-CM | POA: Diagnosis not present

## 2017-03-18 DIAGNOSIS — I25118 Atherosclerotic heart disease of native coronary artery with other forms of angina pectoris: Secondary | ICD-10-CM | POA: Diagnosis not present

## 2017-03-18 DIAGNOSIS — R001 Bradycardia, unspecified: Secondary | ICD-10-CM | POA: Diagnosis not present

## 2017-03-18 DIAGNOSIS — I255 Ischemic cardiomyopathy: Secondary | ICD-10-CM | POA: Diagnosis not present

## 2017-03-18 DIAGNOSIS — E785 Hyperlipidemia, unspecified: Secondary | ICD-10-CM | POA: Diagnosis not present

## 2017-03-18 NOTE — Patient Instructions (Signed)

## 2017-03-18 NOTE — Progress Notes (Signed)
Follow-up Outpatient Visit Date: 03/18/2017  Primary Care Provider: Esaw Grandchild, NP [place] [id number]  Chief Complaint: Follow-up coronary artery disease  HPI:  Jacob Harrison is a 76 y.o. year-old male with history of CAD s/p CABG x 2 (2001 and 2012, HFpEF, DM, HTN, and HLD, who presents for follow-up of stable angina.  I last saw him in August, at which time he noted increasing fatigue.  He denied chest pain, shortness of breath, or claudication.  Preceding stress test in August was low risk without ischemia or scar.  LVEF was noted to be mildly reduced on the stress test but normal by echo in July.  At home, Jacob Harrison noted his heart rates to occasionally be in the 40s.  He agreed to discontinue metoprolol and start carvedilol 6.25 mg twice daily.  He subsequently called our office and noted that his heart rates were still in the 40s to low 50s in the morning.  I asked him to decrease carvedilol to 3.125 mg twice daily.  However, he subsequently noticed elevated blood pressures with systolic readings above 623.  He therefore increase carvedilol back to 6.25 mg daily.  Blood pressure has generally been well controlled between 120-135/70-75.  Resting heart rate is now in the mid 31s.  Jacob Harrison denies chest pain, shortness of breath, palpitations, and lightheadedness.  He has chronic swelling in his ankles and feet following his bypass surgeries.  He is tolerating his medications well.  His biggest limitation has been back pain and weakness in his thighs.  He underwent a steroid injection in his back last month with some improvement in his back pain.  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 LAD with sequential 100% and 95-99% mid stenoses. LCx with 40-50% mid stenosis. RCA with 100% proximal and 80-100% sequential distal lesions. RPDA and rPL1 have 99% and 95% stenoses, respectively. SVG->LAD and SVG->rPDA are patent. LVEF 67% with normal wall motion.  LHC (04/05/10, [place], [place]): LMCA with 30% mid stenosis. LAD with 100% midvessel occlution. LCx with 40% mid stenosis. RCA with 100% proximal stenosis. LIMA->LAD atretic, SVG->LCx occluded, and SVG-> RCA with 80% ostial and 70% distal stenoses.  CV Surgery:  Redo CABG (04/2010, [place], [place]): SVG->LAD and SVG->rPDA.  CABG (2001, [place]): LIMA->LAD, SVG->LCx, and SVG->rPDA  EP Procedures and Devices:  None  Non-Invasive Evaluation(s):  Pharmacologic MPI (11/06/16): Low risk study without ischemia or scar. LVEF 48% (calculated) but visually appears normal.  TTE (10/21/16): Normal LV size with mild LVH. LVEF 65-70% with normal diastolic function. Mild LA enlargement. Normal RV size and function.  ABIs (03/08/15): Right 1.25, left 1.26; no significant change with exercise.  Exercise myocardial perfusion stress test (10/07/12): Small to moderate in size, mild to moderate in severity, partially reversible defect involving the mid inferior and inferolateral segments. LVEF 65%.  Recent CV Pertinent Labs: Lab Results  Component Value Date   CHOL 140 11/14/2016   CHOL 169 05/22/2016   HDL 51 11/14/2016   HDL 51 05/22/2016   LDLCALC 62 11/14/2016   LDLCALC 76 05/22/2016   TRIG 136 11/14/2016   CHOLHDL 2.7 11/14/2016   K 4.6 05/22/2016   BUN  15 05/22/2016   CREATININE 0.76 05/22/2016    Past medical and surgical history were reviewed and updated in EPIC.  Current Meds  Medication Sig  . aspirin EC 81 MG tablet Take 1  tablet (81 mg total) by mouth daily.  . carvedilol (COREG) 6.25 MG tablet Take 1 tablet by mouth 2 (two) times daily.  . Cholecalciferol (VITAMIN D-3 PO) Take 800 Units by mouth daily.  . Coenzyme Q10 (CO Q-10) 200 MG CAPS Take 1 capsule by mouth daily.  . cyanocobalamin 500 MCG tablet Take 500 mcg by mouth daily.  Marland Kitchen ezetimibe (ZETIA) 10 MG tablet Take 1 tablet (10 mg total) by mouth daily.  . finasteride (PROSCAR) 5 MG tablet TAKE 1 TABLET BY MOUTH ONCE DAILY  . Glucosamine-Chondroit-Vit C-Mn (GLUCOSAMINE 1500 COMPLEX) CAPS Take 1 capsule by mouth daily.  . metFORMIN (GLUCOPHAGE) 500 MG tablet Take 1 tablet (500 mg total) by mouth daily.  . Multiple Vitamin (MULTIVITAMIN) tablet Take 1 tablet by mouth daily.  . nitroGLYCERIN (NITROSTAT) 0.4 MG SL tablet Place 1 tablet under the tongue as needed.  Marland Kitchen RANEXA 1000 MG SR tablet TAKE 1 TABLET BY MOUTH TWICE DAILY  . rosuvastatin (CRESTOR) 20 MG tablet Take 1 tablet (20 mg total) by mouth at bedtime.    Allergies: Tetracyclines & related  Social History   Socioeconomic History  . Marital status: Married    Spouse name: Not on file  . Number of children: Not on file  . Years of education: Not on file  . Highest education level: Not on file  Social Needs  . Financial resource strain: Not on file  . Food insecurity - worry: Not on file  . Food insecurity - inability: Not on file  . Transportation needs - medical: Not on file  . Transportation needs - non-medical: Not on file  Occupational History  . Not on file  Tobacco Use  . Smoking status: Never Smoker  . Smokeless tobacco: Never Used  Substance and Sexual Activity  . Alcohol use: Yes    Alcohol/week: 6.6 oz    Types: 5 Glasses of wine, 6 Cans of beer per week  . Drug use: No  . Sexual activity: Not Currently    Birth control/protection: None  Other Topics Concern  . Not on file  Social History Narrative  . Not on file    Family History  Problem Relation Age of Onset  .  Diabetes Mother   . Hyperlipidemia Mother   . Cancer Father        colon  . Alcohol abuse Maternal Uncle   . Diabetes Maternal Uncle   . Hyperlipidemia Maternal Uncle   . Cancer Paternal Uncle        colon    Review of Systems: A 12-system review of systems was performed and was negative except as noted in the HPI.  --------------------------------------------------------------------------------------------------  Physical Exam: BP 130/72 (BP Location: Left Arm, Patient Position: Sitting, Cuff Size: Normal)   Pulse 69   Ht 5\' 7"  (1.702 m)   Wt 213 lb 4 oz (96.7 kg)   BMI 33.40 kg/m   General: Obese man, seated comfortably in the exam room. HEENT: No conjunctival pallor or scleral icterus. Moist mucous membranes.  OP clear. Neck: Supple without lymphadenopathy, thyromegaly, JVD, or HJR. Lungs: Normal work of breathing.  Bronchitic sounds noted at the left lung base.  Otherwise clear lungs with good air movement. Heart: Regular rate and rhythm without murmurs, rubs, or gallops. Non-displaced PMI.  Abd: Bowel sounds present. Soft, NT/ND without hepatosplenomegaly Ext: 1+ ankle edema bilaterally. Radial, PT, and DP pulses are 2+ bilaterally. Skin: Warm and dry without rash.  Well-healed median sternotomy incision.  EKG: Normal sinus rhythm without abnormalities.  Lab Results  Component Value Date   WBC 10.3 05/22/2016   HGB 15.1 05/22/2016   HCT 45.9 05/22/2016   MCV 92 05/22/2016   PLT 228 05/22/2016    Lab Results  Component Value Date   NA 139 05/22/2016   K 4.6 05/22/2016   CL 97 05/22/2016   CO2 22 05/22/2016   BUN 15 05/22/2016   CREATININE 0.76 05/22/2016   GLUCOSE 100 (H) 05/22/2016   ALT 41 11/14/2016    Lab Results  Component Value Date   CHOL 140 11/14/2016   HDL 51 11/14/2016   LDLCALC 62 11/14/2016   TRIG 136 11/14/2016   CHOLHDL 2.7 11/14/2016     --------------------------------------------------------------------------------------------------  ASSESSMENT AND PLAN: Coronary artery disease with stable angina Jacob Harrison is doing well without recurrent chest pain on carvedilol and ranolazine.  We will continue these medications indefinitely, as well as aggressive secondary prevention with rosuvastatin, ezetimibe, and aspirin.  I encouraged him to begin exercising.  Sinus bradycardia Heart rate today is 69 bpm.  Home resting heart rates are typically in the mid 50s but without symptoms.  Fatigue has improved.  We will continue current dose of carvedilol.  Ischemic cardiomyopathy and heart failure with preserved ejection fraction Mild lower extremity edema is stable.  No symptoms to suggest worsening heart failure.  Echocardiogram this summer showed preserved LV function.  No further intervention at this time.  I advised Jacob Harrison to try to limit his sodium intake.  Hypertension Pressure is borderline elevated today.  I will not make any medication changes at this time.  If his blood pressure continues to trend up, I would favor adding an ACE inhibitor or ARB, given history of diabetes.  Hyperlipidemia LDL at goal in August.  Continue current doses of ezetimibe and rosuvastatin.  Follow-up: Return to clinic in 6 months.  Nelva Bush, MD 03/18/2017 10:51 AM

## 2017-03-23 ENCOUNTER — Other Ambulatory Visit (INDEPENDENT_AMBULATORY_CARE_PROVIDER_SITE_OTHER): Payer: Medicare Other

## 2017-03-23 DIAGNOSIS — Z1211 Encounter for screening for malignant neoplasm of colon: Secondary | ICD-10-CM

## 2017-03-23 LAB — IFOBT (OCCULT BLOOD)
IFOBT: NEGATIVE
IFOBT: NEGATIVE
IMMUNOLOGICAL FECAL OCCULT BLOOD TEST: NEGATIVE

## 2017-03-29 ENCOUNTER — Other Ambulatory Visit: Payer: Self-pay | Admitting: Adult Health

## 2017-05-24 ENCOUNTER — Other Ambulatory Visit: Payer: Self-pay | Admitting: Adult Health

## 2017-06-08 ENCOUNTER — Telehealth: Payer: Self-pay

## 2017-06-08 ENCOUNTER — Ambulatory Visit: Payer: Medicare Other

## 2017-06-08 ENCOUNTER — Ambulatory Visit (INDEPENDENT_AMBULATORY_CARE_PROVIDER_SITE_OTHER): Payer: Medicare Other | Admitting: Adult Health

## 2017-06-08 ENCOUNTER — Encounter: Payer: Self-pay | Admitting: Adult Health

## 2017-06-08 VITALS — BP 124/74 | HR 66 | Ht 67.0 in | Wt 212.5 lb

## 2017-06-08 DIAGNOSIS — M542 Cervicalgia: Secondary | ICD-10-CM

## 2017-06-08 NOTE — Telephone Encounter (Signed)
Esaw Grandchild, NP  Fonnie Mu, CMA        Good Afternoon Kenney Houseman,  Can you please call Mr. 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  We discussed that this is not unusual with aging.  If symptoms worsen, please advise him to follow up with [facility]- he last saw practice Oct 2018.  Thanks!  Katy    Pt informed of results.  Pt expressed understanding and is agreeable.  Charyl Bigger, CMA

## 2017-06-08 NOTE — Assessment & Plan Note (Addendum)
Cervical Spine XRAY- "Straightened alignment with degenerative disc disease at C5-6 and C6-7 with some foraminal narrowing bilaterally at these levels" Continue medications as directed by Urgent Care. We will call when lab results are available. Continue heating pad, several times daily. Please call clinic with any questions/concerns.

## 2017-06-08 NOTE — Progress Notes (Signed)
Subjective:    Patient ID: Jacob Harrison, male    DOB: 08/26/1940, 77 y.o.   MRN: [id number]  HPI:  Jacob Harrison presents with cervical neck pain that spontaneously started last wed 06/03/17 in the early am.  He denies acute injury/accident prior to onset of sx's. Pain/stiffness is over neck and bilateral trapezius, initially rated 8/10.  He denies numbness/tingling in hands. He reports taking several nitrostat 0.4mg  SL wed am, however denies any chest pain/palpitations, he states "I just wanted to make sure it wasn't my heart". He was seen at [facility] in [place] and treated with Prednisone dose pak and methocarbamol- he reports taking as directed.  He now reports pain 2/10 and has been using heating pad and light stretching. He reports improvement with ROM over the last two days with most discomfort with extension He also reports a very mild occipital HA, 2/10 that started same time as neck pain. He denies this ever occurring before.  He has hx of scoliosis, spinal stenosis and has been seen by Dr. Judithann Graves in past.   Patient Care Team    Relationship Specialty Notifications Start End  Esaw Grandchild, NP PCP - General Family Medicine  05/20/16     Patient Active Problem List   Diagnosis Date Noted  . Neck pain 06/08/2017  . Sinus bradycardia 03/18/2017  . Screening for colon cancer 02/25/2017  . Ventral hernia without obstruction or gangrene 02/25/2017  . (HFpEF) heart failure with preserved ejection fraction ([place]) 09/28/2016  . Ischemic cardiomyopathy 09/28/2016  . Cough 07/22/2016  . Vitamin D deficiency 06/27/2016  . Vitamin B deficiency 06/27/2016  . Hyperlipidemia LDL goal <70 06/21/2016  . Weakness of both lower extremities 06/21/2016  . Diabetes mellitus without complication ([place]) 24/23/5361  . Essential hypertension 05/20/2016  . Hyperlipidemia associated with type 2 diabetes mellitus ([place]) 05/20/2016  . Coronary artery disease 05/20/2016     Past Medical  History:  Diagnosis Date  . BPH (benign prostatic hyperplasia)   . CHF (congestive heart failure) ([place])   . Coronary artery disease   . Diabetes mellitus without complication ([place])   . Hyperlipidemia   . Scoliosis   . Spinal stenosis      Past Surgical History:  Procedure Laterality Date  . CORONARY ARTERY BYPASS GRAFT     x 2 (2001 and redo in late 2011 or early 2012)  . HERNIA REPAIR Left    inguinal  . TONSILLECTOMY AND ADENOIDECTOMY    . TRANSURETHRAL RESECTION OF PROSTATE  1990     Family History  Problem Relation Age of Onset  . Diabetes Mother   . Hyperlipidemia Mother   . Cancer Father        colon  . Alcohol abuse Maternal Uncle   . Diabetes Maternal Uncle   . Hyperlipidemia Maternal Uncle   . Cancer Paternal Uncle        colon     Social History   Substance and Sexual Activity  Drug Use No     Social History   Substance and Sexual Activity  Alcohol Use Yes  . Alcohol/week: 6.6 oz  . Types: 5 Glasses of wine, 6 Cans of beer per week     Social History   Tobacco Use  Smoking Status Never Smoker  Smokeless Tobacco Never Used     Outpatient Encounter Medications as of 06/08/2017  Medication Sig  . aspirin EC 81 MG tablet Take 1 tablet (81 mg total) by mouth daily.  Marland Kitchen  carvedilol (COREG) 6.25 MG tablet Take 1 tablet by mouth 2 (two) times daily.  . Cholecalciferol (VITAMIN D-3 PO) Take 800 Units by mouth daily.  . Coenzyme Q10 (CO Q-10) 200 MG CAPS Take 1 capsule by mouth daily.  . cyanocobalamin 500 MCG tablet Take 500 mcg by mouth daily.  Marland Kitchen ezetimibe (ZETIA) 10 MG tablet Take 1 tablet (10 mg total) by mouth daily.  . finasteride (PROSCAR) 5 MG tablet TAKE 1 TABLET BY MOUTH ONCE DAILY  . Glucosamine-Chondroit-Vit C-Mn (GLUCOSAMINE 1500 COMPLEX) CAPS Take 1 capsule by mouth daily.  . metFORMIN (GLUCOPHAGE) 500 MG tablet Take 1 tablet (500 mg total) by mouth daily.  . methocarbamol (ROBAXIN) 750 MG tablet Take 1 tablet by mouth daily.  .  Multiple Vitamin (MULTIVITAMIN) tablet Take 1 tablet by mouth daily.  . Multiple Vitamins-Minerals (ICAPS AREDS 2) CAPS Take 2 tablets by mouth daily.  . nitroGLYCERIN (NITROSTAT) 0.4 MG SL tablet Place 1 tablet under the tongue as needed.  . predniSONE (STERAPRED UNI-PAK 21 TAB) 10 MG (21) TBPK tablet as directed.  Marland Kitchen RANEXA 1000 MG SR tablet TAKE 1 TABLET BY MOUTH TWICE DAILY  . rosuvastatin (CRESTOR) 20 MG tablet TAKE 1 TABLET BY MOUTH AT BEDTIME   No facility-administered encounter medications on file as of 06/08/2017.     Allergies: Tetracyclines & related  Body mass index is 33.28 kg/m.  Blood pressure 124/74, pulse 66, height 5\' 7"  (1.702 m), weight 212 lb 8 oz (96.4 kg), SpO2 96 %.  Review of Systems  Constitutional: Positive for activity change and fatigue. Negative for appetite change, chills, diaphoresis, fever and unexpected weight change.  Respiratory: Negative for cough, chest tightness, shortness of breath and stridor.   Cardiovascular: Negative for chest pain, palpitations and leg swelling.  Musculoskeletal: Positive for arthralgias, myalgias, neck pain and neck stiffness. Negative for back pain, gait problem and joint swelling.  Neurological: Positive for headaches. Negative for dizziness.  Hematological: Does not bruise/bleed easily.  Psychiatric/Behavioral: Positive for sleep disturbance.       Objective:   Physical Exam  Constitutional: He is oriented to person, place, and time. He appears well-developed and well-nourished. No distress.  HENT:  Head: Normocephalic and atraumatic.  Right Ear: External ear normal.  Eyes: Conjunctivae are normal. Pupils are equal, round, and reactive to light.  Neck: Normal range of motion. Neck supple.  Cardiovascular: Normal rate, regular rhythm, normal heart sounds and intact distal pulses.  Pulmonary/Chest: Effort normal and breath sounds normal. No respiratory distress. He has no wheezes. He has no rales. He exhibits no  tenderness.  Musculoskeletal: He exhibits tenderness.       Cervical back: He exhibits decreased range of motion, tenderness and spasm. He exhibits no bony tenderness, no swelling and no edema.  Limited flexion  Lymphadenopathy:    He has no cervical adenopathy.  Neurological: He is alert and oriented to person, place, and time.  Skin: He is not diaphoretic.          Assessment & Plan:   1. Neck pain     Neck pain Cervical Spine XRAY- "Straightened alignment with degenerative disc disease at C5-6 and C6-7 with some foraminal narrowing bilaterally at these levels" Continue medications as directed by Urgent Care. We will call when lab results are available. Continue heating pad, several times daily. Please call clinic with any questions/concerns.    FOLLOW-UP:  Return if symptoms worsen or fail to improve.

## 2017-06-08 NOTE — Patient Instructions (Signed)

## 2017-06-10 ENCOUNTER — Other Ambulatory Visit: Payer: Self-pay | Admitting: Adult Health

## 2017-06-26 ENCOUNTER — Other Ambulatory Visit: Payer: Self-pay | Admitting: Adult Health

## 2017-07-21 ENCOUNTER — Ambulatory Visit: Payer: Medicare Other

## 2017-07-21 ENCOUNTER — Ambulatory Visit: Payer: Medicare Other | Admitting: Adult Health

## 2017-07-21 ENCOUNTER — Encounter: Payer: Self-pay | Admitting: Adult Health

## 2017-07-21 VITALS — BP 131/77 | HR 74 | Temp 99.2°F | Ht 67.0 in | Wt 209.0 lb

## 2017-07-21 DIAGNOSIS — R05 Cough: Secondary | ICD-10-CM | POA: Diagnosis not present

## 2017-07-21 DIAGNOSIS — R062 Wheezing: Secondary | ICD-10-CM

## 2017-07-21 DIAGNOSIS — R059 Cough, unspecified: Secondary | ICD-10-CM

## 2017-07-21 DIAGNOSIS — J209 Acute bronchitis, unspecified: Secondary | ICD-10-CM | POA: Insufficient documentation

## 2017-07-21 MED ORDER — BENZONATATE 200 MG PO CAPS: 200.0000 mg | ORAL_CAPSULE | Freq: Two times a day (BID) | ORAL | 0 refills | Status: DC | PRN

## 2017-07-21 MED ORDER — AZITHROMYCIN 250 MG PO TABS: ORAL_TABLET | ORAL | 0 refills | Status: DC

## 2017-07-21 MED ORDER — PREDNISONE 20 MG PO TABS
ORAL_TABLET | ORAL | 0 refills | Status: DC
Start: 2017-07-21 — End: 2017-07-30

## 2017-07-21 MED ORDER — ALBUTEROL SULFATE HFA 108 (90 BASE) MCG/ACT IN AERS: 2.0000 | INHALATION_SPRAY | Freq: Four times a day (QID) | RESPIRATORY_TRACT | 0 refills | Status: DC | PRN

## 2017-07-21 MED ORDER — ALBUTEROL SULFATE 108 (90 BASE) MCG/ACT IN AEPB: 2.0000 | INHALATION_SPRAY | Freq: Four times a day (QID) | RESPIRATORY_TRACT | 1 refills | Status: DC | PRN

## 2017-07-21 MED ORDER — IPRATROPIUM-ALBUTEROL 0.5-2.5 (3) MG/3ML IN SOLN
3.0000 mL | Freq: Four times a day (QID) | RESPIRATORY_TRACT | Status: DC
  Administered 2017-07-21: 3 mL via RESPIRATORY_TRACT

## 2017-07-21 NOTE — Progress Notes (Signed)
Received fax from pharmacy stating that pt's insurance will not cover Ventolin HFA, but will cover Proair Resiclick.  Per Mina Marble, RX for ProAir sent to pharmacy.  Charyl Bigger, CMA

## 2017-07-21 NOTE — Patient Instructions (Signed)

## 2017-07-21 NOTE — Progress Notes (Addendum)
Subjective:    Patient ID: Jacob Harrison, male    DOB: 05-17-1940, 77 y.o.   MRN: [id number]  HPI:  Mr. Lorentz presents with constant, non-productive cough, wheezing, and fatigue that all started last week and have been steadily worsening.  He reports sleeping in recliner last night due to coughing/wheezing. He denies fever/night sweats/chills/N/V/D, however has low grade fever this afternoon- 99.2 F He denies tobacco use or hx of asthma He has been using OTC cough  Patient Care Team    Relationship Specialty Notifications Start End  Esaw Grandchild, NP PCP - General Family Medicine  05/20/16     Patient Active Problem List   Diagnosis Date Noted  . Wheezing 07/21/2017  . Acute bronchitis 07/21/2017  . Neck pain 06/08/2017  . Sinus bradycardia 03/18/2017  . Screening for colon cancer 02/25/2017  . Ventral hernia without obstruction or gangrene 02/25/2017  . (HFpEF) heart failure with preserved ejection fraction ([place]) 09/28/2016  . Ischemic cardiomyopathy 09/28/2016  . Cough 07/22/2016  . Vitamin D deficiency 06/27/2016  . Vitamin B deficiency 06/27/2016  . Hyperlipidemia LDL goal <70 06/21/2016  . Weakness of both lower extremities 06/21/2016  . Diabetes mellitus without complication ([place]) 59/56/3875  . Essential hypertension 05/20/2016  . Hyperlipidemia associated with type 2 diabetes mellitus ([place]) 05/20/2016  . Coronary artery disease 05/20/2016     Past Medical History:  Diagnosis Date  . BPH (benign prostatic hyperplasia)   . CHF (congestive heart failure) ([place])   . Coronary artery disease   . Diabetes mellitus without complication ([place])   . Hyperlipidemia   . Scoliosis   . Spinal stenosis      Past Surgical History:  Procedure Laterality Date  . CORONARY ARTERY BYPASS GRAFT     x 2 (2001 and redo in late 2011 or early 2012)  . HERNIA REPAIR Left    inguinal  . TONSILLECTOMY AND ADENOIDECTOMY    . TRANSURETHRAL RESECTION OF PROSTATE  1990     Family  History  Problem Relation Age of Onset  . Diabetes Mother   . Hyperlipidemia Mother   . Cancer Father        colon  . Alcohol abuse Maternal Uncle   . Diabetes Maternal Uncle   . Hyperlipidemia Maternal Uncle   . Cancer Paternal Uncle        colon     Social History   Substance and Sexual Activity  Drug Use No     Social History   Substance and Sexual Activity  Alcohol Use Yes  . Alcohol/week: 6.6 oz  . Types: 5 Glasses of wine, 6 Cans of beer per week     Social History   Tobacco Use  Smoking Status Never Smoker  Smokeless Tobacco Never Used     Outpatient Encounter Medications as of 07/21/2017  Medication Sig  . aspirin EC 81 MG tablet Take 1 tablet (81 mg total) by mouth daily.  . carvedilol (COREG) 6.25 MG tablet Take 1 tablet by mouth 2 (two) times daily.  . Cholecalciferol (VITAMIN D-3 PO) Take 800 Units by mouth daily.  . Coenzyme Q10 (CO Q-10) 200 MG CAPS Take 1 capsule by mouth daily.  . cyanocobalamin 500 MCG tablet Take 500 mcg by mouth daily.  Marland Kitchen ezetimibe (ZETIA) 10 MG tablet TAKE 1 TABLET BY MOUTH ONCE DAILY  . finasteride (PROSCAR) 5 MG tablet TAKE 1 TABLET BY MOUTH ONCE DAILY  . Glucosamine-Chondroit-Vit C-Mn (GLUCOSAMINE 1500 COMPLEX) CAPS Take 1 capsule by  mouth daily.  . metFORMIN (GLUCOPHAGE) 500 MG tablet Take 1 tablet (500 mg total) by mouth daily.  . Multiple Vitamin (MULTIVITAMIN) tablet Take 1 tablet by mouth daily.  . Multiple Vitamins-Minerals (ICAPS AREDS 2) CAPS Take 2 tablets by mouth daily.  . nitroGLYCERIN (NITROSTAT) 0.4 MG SL tablet Place 1 tablet under the tongue as needed.  Marland Kitchen RANEXA 1000 MG SR tablet TAKE 1 TABLET BY MOUTH TWICE DAILY  . rosuvastatin (CRESTOR) 20 MG tablet TAKE 1 TABLET BY MOUTH AT BEDTIME  . Albuterol Sulfate (PROAIR RESPICLICK) 854 (90 Base) MCG/ACT AEPB Inhale 2 puffs into the lungs every 6 (six) hours as needed.  Marland Kitchen azithromycin (ZITHROMAX) 250 MG tablet 2 tabs day one.  1 tab days two-five  . benzonatate  (TESSALON) 200 MG capsule Take 1 capsule (200 mg total) by mouth 2 (two) times daily as needed for cough.  . predniSONE (DELTASONE) 20 MG tablet 1 tab every 12 hrs for three days.  1 tab daily for 3 days.  . [DISCONTINUED] albuterol (PROVENTIL HFA;VENTOLIN HFA) 108 (90 Base) MCG/ACT inhaler Inhale 2 puffs into the lungs every 6 (six) hours as needed for wheezing or shortness of breath.  . [DISCONTINUED] methocarbamol (ROBAXIN) 750 MG tablet Take 1 tablet by mouth daily.  . [DISCONTINUED] predniSONE (STERAPRED UNI-PAK 21 TAB) 10 MG (21) TBPK tablet as directed.   Facility-Administered Encounter Medications as of 07/21/2017  Medication  . ipratropium-albuterol (DUONEB) 0.5-2.5 (3) MG/3ML nebulizer solution 3 mL    Allergies: Tetracyclines & related  Body mass index is 32.73 kg/m.  Blood pressure 131/77, pulse 74, temperature 99.2 F (37.3 C), temperature source Oral, height 5\' 7"  (1.702 m), weight 209 lb (94.8 kg).  Review of Systems  Constitutional: Positive for activity change and fatigue. Negative for appetite change, chills, diaphoresis, fever and unexpected weight change.  HENT: Negative for congestion, postnasal drip, sinus pressure, sinus pain, sore throat and voice change.   Eyes: Negative for visual disturbance.  Respiratory: Positive for cough and wheezing. Negative for chest tightness, shortness of breath and stridor.   Cardiovascular: Negative for chest pain, palpitations and leg swelling.  Gastrointestinal: Negative for abdominal distention, abdominal pain, blood in stool, constipation, nausea and vomiting.  Neurological: Negative for dizziness and headaches.  Hematological: Does not bruise/bleed easily.  Psychiatric/Behavioral: Positive for sleep disturbance.       Objective:   Physical Exam  Constitutional: He is oriented to person, place, and time. He appears well-developed and well-nourished. No distress.  HENT:  Head: Normocephalic and atraumatic.  Right Ear:  External ear normal. Tympanic membrane is bulging. Tympanic membrane is not erythematous. No decreased hearing is noted.  Left Ear: External ear normal. Tympanic membrane is bulging. Tympanic membrane is not erythematous. No decreased hearing is noted.  Nose: No mucosal edema or rhinorrhea. Right sinus exhibits no maxillary sinus tenderness and no frontal sinus tenderness. Left sinus exhibits no maxillary sinus tenderness and no frontal sinus tenderness.  Mouth/Throat: Uvula is midline, oropharynx is clear and moist and mucous membranes are normal. No oropharyngeal exudate, posterior oropharyngeal edema, posterior oropharyngeal erythema or tonsillar abscesses.  Eyes: Pupils are equal, round, and reactive to light. Conjunctivae are normal.  Neck: Normal range of motion. Neck supple.  Cardiovascular: Normal rate, regular rhythm, normal heart sounds and intact distal pulses.  No murmur heard. Pulmonary/Chest: Effort normal. No respiratory distress. He has decreased breath sounds in the left lower field. He has wheezes in the right middle field, the right lower field, the left  middle field and the left lower field. He has no rhonchi. He has no rales. He exhibits no tenderness.  Lymphadenopathy:    He has no cervical adenopathy.  Neurological: He is alert and oriented to person, place, and time.  Skin: Skin is warm and dry. No rash noted. He is not diaphoretic. No erythema. No pallor.  Psychiatric: He has a normal mood and affect. His behavior is normal. Judgment and thought content normal.  Nursing note and vitals reviewed.     Assessment & Plan:   1. Cough   2. Wheezing   3. Acute bronchitis, unspecified organism     Cough CXR- IMPRESSION: Minimal left basilar subsegmental atelectasis with minimal left pleural effusion or pleural thickening.  Acute bronchitis CXR- IMPRESSION: Minimal left basilar subsegmental atelectasis with minimal left pleural effusion or pleural thickening. Please  take Azithromycin and Prednisone as directed. Use ProAir and Tesssalon as needed. Increase water intake/rest/vit c-2,000mg /day If symptoms persist after antibiotic completed, then please call clinic.    FOLLOW-UP:  Return if symptoms worsen or fail to improve.

## 2017-07-21 NOTE — Assessment & Plan Note (Addendum)
CXR- IMPRESSION: Minimal left basilar subsegmental atelectasis with minimal left pleural effusion or pleural thickening. Please take Azithromycin and Prednisone as directed. Use ProAir and Tesssalon as needed. Increase water intake/rest/vit c-2,000mg /day If symptoms persist after antibiotic completed, then please call clinic.

## 2017-07-21 NOTE — Assessment & Plan Note (Addendum)
CXR- IMPRESSION: Minimal left basilar subsegmental atelectasis with minimal left pleural effusion or pleural thickening.

## 2017-07-21 NOTE — Addendum Note (Signed)
Addended by: Fonnie Mu on: 07/21/2017 05:48 PM   Modules accepted: Orders

## 2017-07-29 ENCOUNTER — Ambulatory Visit: Payer: Medicare Other | Admitting: Adult Health

## 2017-07-29 NOTE — Progress Notes (Signed)
Subjective:    Patient ID: Jacob Harrison, male    DOB: [birth year], 77 y.o.   MRN: [id number]  HPI:  11/27/16 OV: Jacob Harrison is here for regular f/u: HTN, HL, T2D.  Due to bradycardia his cardiologist d/c'd toprol and started him on carvedilol 6.25mg  BID.  He was instructed to talke his BP and HR daily and if HR consistently <55 to call cards.  Today HR is 48 and he reports home HR 46-mid 50s.  He also has been experiencing dizziness with position changes and increased fatigue.  He has not called Dr. Bernita Buffy to report continued bradycardia. He continues to eat a heart healthy diet and "moves all day whether is be yeard work or exercise at the gym".   He reports medication compliance, denies SE. His last lipid panel looked great and cards continued her current statin therapy. He reports dramatic reduction in angina and has only required 1 nitrostat in the last 6 months.  02/25/17 OV: Jacob Harrison is here for CPE. Due to bradycardia, cards instructed him to decrease carvedilol to 3.125mg  BID.  Then his BP increased and was consistently above goal and cards bumped carvedilol back to 6 mg BID.  BP improved and HR has remained >50.  Home HR 50-55 and dizziness has completely resolved. Home AM BS 110s, denies episodes of hypoglycemia.  He walks .5-1 mile 3-4 days week. He tries to follow low saturated fat/CHO/sugar diet and continues to abstain from tobacco use.  He enjoys whiskey/water 1-2 day.  Healthcare Maintenance: Colonoscopy- Not due until 2024, IFOBT cards provided today. AAA Screening- he has never smoked, therefore screening is not recommended.  07/30/17 OV: Jacob Harrison presents for f/u: HTN, HLD, T2D Fasting labs obtained today He reports medication compliance, denies SE AM BS 105-120s, denies episodes of hypoglycemia  Lab Results  Component Value Date   HGBA1C 6.0 07/30/2017   HGBA1C 6.0 02/25/2017   HGBA1C 5.7 11/27/2016    Patient Care Team    Relationship Specialty  Notifications Start End  Esaw Grandchild, NP PCP - General Family Medicine  05/20/16     Patient Active Problem List   Diagnosis Date Noted  . Atypical mole 07/30/2017  . Healthcare maintenance 07/30/2017  . Wheezing 07/21/2017  . Acute bronchitis 07/21/2017  . Neck pain 06/08/2017  . Sinus bradycardia 03/18/2017  . Screening for colon cancer 02/25/2017  . Ventral hernia without obstruction or gangrene 02/25/2017  . (HFpEF) heart failure with preserved ejection fraction ([place]) 09/28/2016  . Ischemic cardiomyopathy 09/28/2016  . Cough 07/22/2016  . Vitamin D deficiency 06/27/2016  . Vitamin B deficiency 06/27/2016  . Hyperlipidemia LDL goal <70 06/21/2016  . Weakness of both lower extremities 06/21/2016  . Diabetes mellitus without complication ([place]) 59/56/3875  . Essential hypertension 05/20/2016  . Hyperlipidemia associated with type 2 diabetes mellitus ([place]) 05/20/2016  . Coronary artery disease 05/20/2016     Past Medical History:  Diagnosis Date  . BPH (benign prostatic hyperplasia)   . CHF (congestive heart failure) ([place])   . Coronary artery disease   . Diabetes mellitus without complication ([facility])   . Hyperlipidemia   . Scoliosis   . Spinal stenosis      Past Surgical History:  Procedure Laterality Date  . CORONARY ARTERY BYPASS GRAFT     x 2 (2001 and redo in late 2011 or early 2012)  . HERNIA REPAIR Left    inguinal  . TONSILLECTOMY AND ADENOIDECTOMY    . TRANSURETHRAL RESECTION OF  PROSTATE  1990     Family History  Problem Relation Age of Onset  . Diabetes Mother   . Hyperlipidemia Mother   . Cancer Father        colon  . Alcohol abuse Maternal Uncle   . Diabetes Maternal Uncle   . Hyperlipidemia Maternal Uncle   . Cancer Paternal Uncle        colon     Social History   Substance and Sexual Activity  Drug Use No     Social History   Substance and Sexual Activity  Alcohol Use Yes  . Alcohol/week: 6.6 oz  . Types: 5 Glasses of wine, 6  Cans of beer per week     Social History   Tobacco Use  Smoking Status Never Smoker  Smokeless Tobacco Never Used     Outpatient Encounter Medications as of 07/30/2017  Medication Sig  . Albuterol Sulfate (PROAIR RESPICLICK) 008 (90 Base) MCG/ACT AEPB Inhale 2 puffs into the lungs every 6 (six) hours as needed.  Marland Kitchen aspirin EC 81 MG tablet Take 1 tablet (81 mg total) by mouth daily.  . carvedilol (COREG) 6.25 MG tablet Take 1 tablet by mouth 2 (two) times daily.  . Cholecalciferol (VITAMIN D-3 PO) Take 800 Units by mouth daily.  . Coenzyme Q10 (CO Q-10) 200 MG CAPS Take 1 capsule by mouth daily.  . cyanocobalamin 500 MCG tablet Take 500 mcg by mouth daily.  Marland Kitchen ezetimibe (ZETIA) 10 MG tablet TAKE 1 TABLET BY MOUTH ONCE DAILY  . finasteride (PROSCAR) 5 MG tablet TAKE 1 TABLET BY MOUTH ONCE DAILY  . Glucosamine-Chondroit-Vit C-Mn (GLUCOSAMINE 1500 COMPLEX) CAPS Take 1 capsule by mouth daily.  . metFORMIN (GLUCOPHAGE) 500 MG tablet Take 1 tablet (500 mg total) by mouth daily.  . Multiple Vitamin (MULTIVITAMIN) tablet Take 1 tablet by mouth daily.  . Multiple Vitamins-Minerals (ICAPS AREDS 2) CAPS Take 2 tablets by mouth daily.  . nitroGLYCERIN (NITROSTAT) 0.4 MG SL tablet Place 1 tablet under the tongue as needed.  Marland Kitchen RANEXA 1000 MG SR tablet TAKE 1 TABLET BY MOUTH TWICE DAILY  . rosuvastatin (CRESTOR) 20 MG tablet TAKE 1 TABLET BY MOUTH AT BEDTIME  . [DISCONTINUED] azithromycin (ZITHROMAX) 250 MG tablet 2 tabs day one.  1 tab days two-five  . [DISCONTINUED] benzonatate (TESSALON) 200 MG capsule Take 1 capsule (200 mg total) by mouth 2 (two) times daily as needed for cough.  . [DISCONTINUED] predniSONE (DELTASONE) 20 MG tablet 1 tab every 12 hrs for three days.  1 tab daily for 3 days.   Facility-Administered Encounter Medications as of 07/30/2017  Medication  . ipratropium-albuterol (DUONEB) 0.5-2.5 (3) MG/3ML nebulizer solution 3 mL    Allergies: Tetracyclines & related  Body mass  index is 31.83 kg/m.  Blood pressure 123/75, pulse (!) 54, height 5\' 7"  (1.702 m), weight 203 lb 3.2 oz (92.2 kg), SpO2 97 %.  Review of Systems  Constitutional: Positive for fatigue. Negative for activity change, appetite change, chills, diaphoresis, fever and unexpected weight change.  Eyes: Negative for visual disturbance.  Respiratory: Negative for cough, chest tightness, shortness of breath, wheezing and stridor.   Cardiovascular: Negative for chest pain, palpitations and leg swelling.       Cards wants HR > 50 Today 54 He denies dizziness or increase in fatigue  Gastrointestinal: Negative for abdominal distention, abdominal pain, blood in stool, constipation, diarrhea, nausea and vomiting.  Endocrine: Negative for cold intolerance, heat intolerance, polydipsia, polyphagia and polyuria.  Genitourinary: Negative  for difficulty urinating, flank pain and hematuria.  Musculoskeletal: Positive for arthralgias, back pain and myalgias. Negative for gait problem, joint swelling, neck pain and neck stiffness.  Skin: Negative for color change, pallor, rash and wound.  Neurological: Negative for dizziness, tremors, weakness and light-headedness.  Hematological: Does not bruise/bleed easily.  Psychiatric/Behavioral: Negative for decreased concentration, hallucinations, self-injury, sleep disturbance and suicidal ideas. The patient is not nervous/anxious and is not hyperactive.        Objective:   Physical Exam  Constitutional: He is oriented to person, place, and time. He appears well-developed and well-nourished. No distress.  HENT:  Head: Normocephalic and atraumatic.  Right Ear: External ear normal. No decreased hearing is noted.  Left Ear: External ear normal. No decreased hearing is noted.  Eyes: Pupils are equal, round, and reactive to light. Conjunctivae are normal.  Neck: Normal range of motion. Neck supple.  Cardiovascular: Regular rhythm, normal heart sounds and intact distal  pulses. Bradycardia present.  No murmur heard. Pulmonary/Chest: Effort normal and breath sounds normal. No respiratory distress. He has no wheezes. He has no rales. He exhibits no tenderness.  Abdominal: There is no CVA tenderness. A hernia is present. Hernia confirmed positive in the ventral area.  Genitourinary: Rectal exam shows guaiac negative stool.  Genitourinary Comments: Chaperone present during examination.  Lymphadenopathy:    He has no cervical adenopathy.  Neurological: He is alert and oriented to person, place, and time. Coordination normal.  Skin: Skin is warm and dry. No rash noted. He is not diaphoretic. No erythema. No pallor.  Psychiatric: He has a normal mood and affect. His behavior is normal. Judgment and thought content normal.  Nursing note and vitals reviewed.         Assessment & Plan:   1. Diabetes mellitus without complication ([place])   2. Essential hypertension   3. Hyperlipidemia associated with type 2 diabetes mellitus ([place])   4. Vitamin D deficiency   5. Healthcare maintenance   6. Atypical mole   7. Hyperlipidemia LDL goal <70     Essential hypertension BP at goal 123/75 HR 54 Cards wants HR >50 He denies increase in fatigue from baseline or dizziness Continue carvedilol 6.25mg  BID   Healthcare maintenance YOU ARE DOING A GREAT JOB TAKING CARE OF YOURSELF! Please continue all medications as directed. Please continue healthy eating and regular movement. We will call you when lab results are available. Referral to dermatology placed. Follow-up in 6 months (your blood pressure and diabetes are both so well controlled), sooner if needed.  Atypical mole Referral to derm placed  Vitamin D deficiency Vit d level checked today  Hyperlipidemia LDL goal <70 Lipids checked today Currently taking ezetimibe 10mg  QD and Rosuvastatin 20mg  QD  Hyperlipidemia associated with type 2 diabetes mellitus ([place]) Lab Results  Component Value Date    HGBA1C 6.0 07/30/2017   HGBA1C 6.0 02/25/2017   HGBA1C 5.7 11/27/2016   AM BS 105-120s, denies episodes of hypoglycemia Microalbumin normal today Continue Metformin 500mg  QD    FOLLOW-UP:  Return in about 6 months (around 01/29/2018) for Regular Follow Up.

## 2017-07-30 ENCOUNTER — Encounter: Payer: Self-pay | Admitting: Adult Health

## 2017-07-30 ENCOUNTER — Ambulatory Visit (INDEPENDENT_AMBULATORY_CARE_PROVIDER_SITE_OTHER): Payer: Medicare Other | Admitting: Adult Health

## 2017-07-30 VITALS — BP 123/75 | HR 54 | Ht 67.0 in | Wt 203.2 lb

## 2017-07-30 DIAGNOSIS — E1169 Type 2 diabetes mellitus with other specified complication: Secondary | ICD-10-CM | POA: Diagnosis not present

## 2017-07-30 DIAGNOSIS — E785 Hyperlipidemia, unspecified: Secondary | ICD-10-CM | POA: Diagnosis not present

## 2017-07-30 DIAGNOSIS — Z79899 Other long term (current) drug therapy: Secondary | ICD-10-CM | POA: Diagnosis not present

## 2017-07-30 DIAGNOSIS — E559 Vitamin D deficiency, unspecified: Secondary | ICD-10-CM | POA: Diagnosis not present

## 2017-07-30 DIAGNOSIS — I10 Essential (primary) hypertension: Secondary | ICD-10-CM

## 2017-07-30 DIAGNOSIS — E119 Type 2 diabetes mellitus without complications: Secondary | ICD-10-CM | POA: Diagnosis not present

## 2017-07-30 DIAGNOSIS — Z0000 Encounter for general adult medical examination without abnormal findings: Secondary | ICD-10-CM | POA: Diagnosis not present

## 2017-07-30 DIAGNOSIS — D229 Melanocytic nevi, unspecified: Secondary | ICD-10-CM

## 2017-07-30 LAB — POCT UA - MICROALBUMIN
Albumin/Creatinine Ratio, Urine, POC: <30
CREATININE, POC: 200 mg/dL
MICROALBUMIN (UR) POC: 30 mg/L

## 2017-07-30 LAB — POCT GLYCOSYLATED HEMOGLOBIN (HGB A1C): Hemoglobin A1C: 6

## 2017-07-30 NOTE — Assessment & Plan Note (Signed)
BP at goal 123/75 HR 54 Cards wants HR >50 He denies increase in fatigue from baseline or dizziness Continue carvedilol 6.25mg  BID

## 2017-07-30 NOTE — Patient Instructions (Signed)
Diabetes Mellitus and Nutrition When you have diabetes (diabetes mellitus), it is very important to have healthy eating habits because your blood sugar (glucose) levels are greatly affected by what you eat and drink. Eating healthy foods in the appropriate amounts, at about the same times every day, can help you:  Control your blood glucose.  Lower your risk of heart disease.  Improve your blood pressure.  Reach or maintain a healthy weight.  Every person with diabetes is different, and each person has different needs for a meal plan. Your health care provider may recommend that you work with a diet and nutrition specialist (dietitian) to make a meal plan that is best for you. Your meal plan may vary depending on factors such as:  The calories you need.  The medicines you take.  Your weight.  Your blood glucose, blood pressure, and cholesterol levels.  Your activity level.  Other health conditions you have, such as heart or kidney disease.  How do carbohydrates affect me? Carbohydrates affect your blood glucose level more than any other type of food. Eating carbohydrates naturally increases the amount of glucose in your blood. Carbohydrate counting is a method for keeping track of how many carbohydrates you eat. Counting carbohydrates is important to keep your blood glucose at a healthy level, especially if you use insulin or take certain oral diabetes medicines. It is important to know how many carbohydrates you can safely have in each meal. This is different for every person. Your dietitian can help you calculate how many carbohydrates you should have at each meal and for snack. Foods that contain carbohydrates include:  Bread, cereal, rice, pasta, and crackers.  Potatoes and corn.  Peas, beans, and lentils.  Milk and yogurt.  Fruit and juice.  Desserts, such as cakes, cookies, ice cream, and candy.  How does alcohol affect me? Alcohol can cause a sudden decrease in blood  glucose (hypoglycemia), especially if you use insulin or take certain oral diabetes medicines. Hypoglycemia can be a life-threatening condition. Symptoms of hypoglycemia (sleepiness, dizziness, and confusion) are similar to symptoms of having too much alcohol. If your health care provider says that alcohol is safe for you, follow these guidelines:  Limit alcohol intake to no more than 1 drink per day for nonpregnant women and 2 drinks per day for men. One drink equals 12 oz of beer, 5 oz of wine, or 1 oz of hard liquor.  Do not drink on an empty stomach.  Keep yourself hydrated with water, diet soda, or unsweetened iced tea.  Keep in mind that regular soda, juice, and other mixers may contain a lot of sugar and must be counted as carbohydrates.  What are tips for following this plan? Reading food labels  Start by checking the serving size on the label. The amount of calories, carbohydrates, fats, and other nutrients listed on the label are based on one serving of the food. Many foods contain more than one serving per package.  Check the total grams (g) of carbohydrates in one serving. You can calculate the number of servings of carbohydrates in one serving by dividing the total carbohydrates by 15. For example, if a food has 30 g of total carbohydrates, it would be equal to 2 servings of carbohydrates.  Check the number of grams (g) of saturated and trans fats in one serving. Choose foods that have low or no amount of these fats.  Check the number of milligrams (mg) of sodium in one serving. Most people   should limit total sodium intake to less than 2,300 mg per day.  Always check the nutrition information of foods labeled as "low-fat" or "nonfat". These foods may be higher in added sugar or refined carbohydrates and should be avoided.  Talk to your dietitian to identify your daily goals for nutrients listed on the label. Shopping  Avoid buying canned, premade, or processed foods. These  foods tend to be high in fat, sodium, and added sugar.  Shop around the outside edge of the grocery store. This includes fresh fruits and vegetables, bulk grains, fresh meats, and fresh dairy. Cooking  Use low-heat cooking methods, such as baking, instead of high-heat cooking methods like deep frying.  Cook using healthy oils, such as olive, canola, or sunflower oil.  Avoid cooking with butter, cream, or high-fat meats. Meal planning  Eat meals and snacks regularly, preferably at the same times every day. Avoid going long periods of time without eating.  Eat foods high in fiber, such as fresh fruits, vegetables, beans, and whole grains. Talk to your dietitian about how many servings of carbohydrates you can eat at each meal.  Eat 4-6 ounces of lean protein each day, such as lean meat, chicken, fish, eggs, or tofu. 1 ounce is equal to 1 ounce of meat, chicken, or fish, 1 egg, or 1/4 cup of tofu.  Eat some foods each day that contain healthy fats, such as avocado, nuts, seeds, and fish. Lifestyle   Check your blood glucose regularly.  Exercise at least 30 minutes 5 or more days each week, or as told by your health care provider.  Take medicines as told by your health care provider.  Do not use any products that contain nicotine or tobacco, such as cigarettes and e-cigarettes. If you need help quitting, ask your health care provider.  Work with a Social worker or diabetes educator to identify strategies to manage stress and any emotional and social challenges. What are some questions to ask my health care provider?  Do I need to meet with a diabetes educator?  Do I need to meet with a dietitian?  What number can I call if I have questions?  When are the best times to check my blood glucose? Where to find more information:  American Diabetes Association: diabetes.org/food-and-fitness/food  Academy of Nutrition and Dietetics:  PokerClues.dk  [employer] of Diabetes and Digestive and Kidney Diseases (NIH): ContactWire.be Summary  A healthy meal plan will help you control your blood glucose and maintain a healthy lifestyle.  Working with a diet and nutrition specialist (dietitian) can help you make a meal plan that is best for you.  Keep in mind that carbohydrates and alcohol have immediate effects on your blood glucose levels. It is important to count carbohydrates and to use alcohol carefully. This information is not intended to replace advice given to you by your health care provider. Make sure you discuss any questions you have with your health care provider. Document Released: 12/19/2004 Document Revised: 04/28/2016 Document Reviewed: 04/28/2016 Elsevier Interactive Patient Education  2018 [place].   Back Exercises The following exercises strengthen the muscles that help to support the back. They also help to keep the lower back flexible. Doing these exercises can help to prevent back pain or lessen existing pain. If you have back pain or discomfort, try doing these exercises 2-3 times each day or as told by your health care provider. When the pain goes away, do them once each day, but increase the number of times  that you repeat the steps for each exercise (do more repetitions). If you do not have back pain or discomfort, do these exercises once each day or as told by your health care provider. Exercises Single Knee to Chest  Repeat these steps 3-5 times for each leg: 1. Lie on your back on a firm bed or the floor with your legs extended. 2. Bring one knee to your chest. Your other leg should stay extended and in contact with the floor. 3. Hold your knee in place by grabbing your knee or thigh. 4. Pull on your knee until you feel a gentle stretch in your lower back. 5. Hold  the stretch for 10-30 seconds. 6. Slowly release and straighten your leg.  Pelvic Tilt  Repeat these steps 5-10 times: 1. Lie on your back on a firm bed or the floor with your legs extended. 2. Bend your knees so they are pointing toward the ceiling and your feet are flat on the floor. 3. Tighten your lower abdominal muscles to press your lower back against the floor. This motion will tilt your pelvis so your tailbone points up toward the ceiling instead of pointing to your feet or the floor. 4. With gentle tension and even breathing, hold this position for 5-10 seconds.  Cat-Cow  Repeat these steps until your lower back becomes more flexible: 1. Get into a hands-and-knees position on a firm surface. Keep your hands under your shoulders, and keep your knees under your hips. You may place padding under your knees for comfort. 2. Let your head hang down, and point your tailbone toward the floor so your lower back becomes rounded like the back of a cat. 3. Hold this position for 5 seconds. 4. Slowly lift your head and point your tailbone up toward the ceiling so your back forms a sagging arch like the back of a cow. 5. Hold this position for 5 seconds.  Press-Ups  Repeat these steps 5-10 times: 1. Lie on your abdomen (face-down) on the floor. 2. Place your palms near your head, about shoulder-width apart. 3. While you keep your back as relaxed as possible and keep your hips on the floor, slowly straighten your arms to raise the top half of your body and lift your shoulders. Do not use your back muscles to raise your upper torso. You may adjust the placement of your hands to make yourself more comfortable. 4. Hold this position for 5 seconds while you keep your back relaxed. 5. Slowly return to lying flat on the floor.  Bridges  Repeat these steps 10 times: 1. Lie on your back on a firm surface. 2. Bend your knees so they are pointing toward the ceiling and your feet are flat on the  floor. 3. Tighten your buttocks muscles and lift your buttocks off of the floor until your waist is at almost the same height as your knees. You should feel the muscles working in your buttocks and the back of your thighs. If you do not feel these muscles, slide your feet 1-2 inches farther away from your buttocks. 4. Hold this position for 3-5 seconds. 5. Slowly lower your hips to the starting position, and allow your buttocks muscles to relax completely.  If this exercise is too easy, try doing it with your arms crossed over your chest. Abdominal Crunches  Repeat these steps 5-10 times: 1. Lie on your back on a firm bed or the floor with your legs extended. 2. Bend your knees so they  are pointing toward the ceiling and your feet are flat on the floor. 3. Cross your arms over your chest. 4. Tip your chin slightly toward your chest without bending your neck. 5. Tighten your abdominal muscles and slowly raise your trunk (torso) high enough to lift your shoulder blades a tiny bit off of the floor. Avoid raising your torso higher than that, because it can put too much stress on your low back and it does not help to strengthen your abdominal muscles. 6. Slowly return to your starting position.  Back Lifts Repeat these steps 5-10 times: 1. Lie on your abdomen (face-down) with your arms at your sides, and rest your forehead on the floor. 2. Tighten the muscles in your legs and your buttocks. 3. Slowly lift your chest off of the floor while you keep your hips pressed to the floor. Keep the back of your head in line with the curve in your back. Your eyes should be looking at the floor. 4. Hold this position for 3-5 seconds. 5. Slowly return to your starting position.  Contact a health care provider if:  Your back pain or discomfort gets much worse when you do an exercise.  Your back pain or discomfort does not lessen within 2 hours after you exercise. If you have any of these problems, stop  doing these exercises right away. Do not do them again unless your health care provider says that you can. Get help right away if:  You develop sudden, severe back pain. If this happens, stop doing the exercises right away. Do not do them again unless your health care provider says that you can. This information is not intended to replace advice given to you by your health care provider. Make sure you discuss any questions you have with your health care provider. Document Released: 05/01/2004 Document Revised: 08/01/2015 Document Reviewed: 05/18/2014 Elsevier Interactive Patient Education  2017 [place] ARE DOING A GREAT JOB TAKING CARE OF YOURSELF! Please continue all medications as directed. Please continue healthy eating and regular movement. We will call you when lab results are available. Referral to dermatology placed. Follow-up in 6 months (your blood pressure and diabetes are both so well controlled), sooner if needed. NICE TO SEE YOU!

## 2017-07-30 NOTE — Assessment & Plan Note (Signed)
Vit d level checked today

## 2017-07-30 NOTE — Assessment & Plan Note (Signed)
YOU ARE DOING A GREAT JOB TAKING CARE OF YOURSELF! Please continue all medications as directed. Please continue healthy eating and regular movement. We will call you when lab results are available. Referral to dermatology placed. Follow-up in 6 months (your blood pressure and diabetes are both so well controlled), sooner if needed.

## 2017-07-30 NOTE — Assessment & Plan Note (Addendum)
>>  ASSESSMENT AND PLAN FOR HYPERLIPIDEMIA ASSOCIATED WITH TYPE 2 DIABETES MELLITUS ([facility]) WRITTEN ON 07/30/2017  9:25 AM BY Nacole Fluhr D, NP  Lab Results  Component Value Date   HGBA1C 6.0 07/30/2017   HGBA1C 6.0 02/25/2017   HGBA1C 5.7 11/27/2016   AM BS 105-120s, denies episodes of hypoglycemia Microalbumin normal today Continue Metformin 500mg  QD  >>ASSESSMENT AND PLAN FOR MIXED HYPERLIPIDEMIA WRITTEN ON 07/30/2017  9:24 AM BY Pepe Mineau D, NP  Lipids checked today Currently taking ezetimibe 10mg  QD and Rosuvastatin 20mg  QD

## 2017-07-30 NOTE — Assessment & Plan Note (Signed)
Referral to derm placed.

## 2017-07-30 NOTE — Assessment & Plan Note (Addendum)
Lipids checked today Currently taking ezetimibe 10mg  QD and Rosuvastatin 20mg  QD

## 2017-07-31 LAB — COMPREHENSIVE METABOLIC PANEL
A/G RATIO: 2.3 — AB (ref 1.2–2.2)
ALK PHOS: 83 IU/L (ref 39–117)
ALT: 26 IU/L (ref 0–44)
AST: 21 IU/L (ref 0–40)
Albumin: 4.8 g/dL (ref 3.5–4.8)
BILIRUBIN TOTAL: 0.7 mg/dL (ref 0.0–1.2)
BUN/Creatinine Ratio: 12 (ref 10–24)
BUN: 10 mg/dL (ref 8–27)
CHLORIDE: 99 mmol/L (ref 96–106)
CO2: 21 mmol/L (ref 20–29)
Calcium: 10 mg/dL (ref 8.6–10.2)
Creatinine, Ser: 0.82 mg/dL (ref 0.76–1.27)
GFR calc non Af Amer: 87 mL/min/{1.73_m2} (ref >59)
GFR, EST AFRICAN AMERICAN: 101 mL/min/{1.73_m2} (ref >59)
Globulin, Total: 2.1 g/dL (ref 1.5–4.5)
Glucose: 113 mg/dL — ABNORMAL HIGH (ref 65–99)
POTASSIUM: 5.1 mmol/L (ref 3.5–5.2)
Sodium: 139 mmol/L (ref 134–144)
TOTAL PROTEIN: 6.9 g/dL (ref 6.0–8.5)

## 2017-07-31 LAB — CBC WITH DIFFERENTIAL/PLATELET
BASOS: 0 %
Basophils Absolute: 0 10*3/uL (ref 0.0–0.2)
EOS (ABSOLUTE): 0.2 10*3/uL (ref 0.0–0.4)
Eos: 2 %
Hematocrit: 45 % (ref 37.5–51.0)
Hemoglobin: 15.7 g/dL (ref 13.0–17.7)
IMMATURE GRANS (ABS): 0.2 10*3/uL — AB (ref 0.0–0.1)
Immature Granulocytes: 1 %
LYMPHS ABS: 2.7 10*3/uL (ref 0.7–3.1)
LYMPHS: 23 %
MCH: 31.1 pg (ref 26.6–33.0)
MCHC: 34.9 g/dL (ref 31.5–35.7)
MCV: 89 fL (ref 79–97)
MONOS ABS: 1.2 10*3/uL — AB (ref 0.1–0.9)
Monocytes: 10 %
NEUTROS ABS: 7.8 10*3/uL — AB (ref 1.4–7.0)
Neutrophils: 64 %
PLATELETS: 258 10*3/uL (ref 150–379)
RBC: 5.05 x10E6/uL (ref 4.14–5.80)
RDW: 14.9 % (ref 12.3–15.4)
WBC: 11.9 10*3/uL — ABNORMAL HIGH (ref 3.4–10.8)

## 2017-07-31 LAB — VITAMIN D 25 HYDROXY (VIT D DEFICIENCY, FRACTURES): Vit D, 25-Hydroxy: 32.3 ng/mL (ref 30.0–100.0)

## 2017-07-31 LAB — TSH: TSH: 1.2 u[IU]/mL (ref 0.450–4.500)

## 2017-07-31 LAB — LIPID PANEL
Chol/HDL Ratio: 3.3 ratio (ref 0.0–5.0)
Cholesterol, Total: 144 mg/dL (ref 100–199)
HDL: 44 mg/dL (ref >39)
LDL Calculated: 65 mg/dL (ref 0–99)
TRIGLYCERIDES: 176 mg/dL — AB (ref 0–149)
VLDL Cholesterol Cal: 35 mg/dL (ref 5–40)

## 2017-07-31 LAB — HEMOGLOBIN A1C
ESTIMATED AVERAGE GLUCOSE: 128 mg/dL
HEMOGLOBIN A1C: 6.1 % — AB (ref 4.8–5.6)

## 2017-07-31 LAB — MAGNESIUM: MAGNESIUM: 2.2 mg/dL (ref 1.6–2.3)

## 2017-08-17 ENCOUNTER — Other Ambulatory Visit: Payer: Self-pay | Admitting: Adult Health

## 2017-08-17 ENCOUNTER — Telehealth: Payer: Self-pay

## 2017-08-17 NOTE — Telephone Encounter (Signed)
Received Epic notification that pt has not read MyChart message regarding results.  '  Recent lab results   From Fonnie Mu, CMA To Jacob Harrison Sent 08/03/2017 4:46 PM  Dear Mr. Studstill,   Valetta Fuller asked that I inform you that your recent labs showed that your magnesium level, complete blood count, metabolic panel, thyroid test and Vitamin D levels were all normal. Your A1c (3 month average of blood sugars) was 6.1, which is a small bump from 6.0 four months ago.    Your cholesterol panel showed the following:  Total cholesterol- 144  Triglycerides of 176, which is a slight increase from 136 eight months ago  HDL (good cholesterol) of 44  LDL (bad cholesterol) of 65, which is at goal   Please continue your current statin and Zetia dosages   Overall things look good!   Wishing you well,  Kenney Houseman, CMA for  Mina Marble, NP     [address]   MyChart User Last Read On  Jacob Harrison Not Read     Pt informed of results.  Pt expressed understanding and is agreeable.  Charyl Bigger, CMA

## 2017-09-01 ENCOUNTER — Ambulatory Visit (INDEPENDENT_AMBULATORY_CARE_PROVIDER_SITE_OTHER): Payer: Medicare Other

## 2017-09-01 ENCOUNTER — Ambulatory Visit (INDEPENDENT_AMBULATORY_CARE_PROVIDER_SITE_OTHER): Payer: Medicare Other | Admitting: Orthopaedic Surgery

## 2017-09-01 ENCOUNTER — Encounter (INDEPENDENT_AMBULATORY_CARE_PROVIDER_SITE_OTHER): Payer: Self-pay | Admitting: Orthopaedic Surgery

## 2017-09-01 VITALS — BP 135/79 | HR 67 | Ht 67.0 in | Wt 200.0 lb

## 2017-09-01 DIAGNOSIS — M545 Low back pain: Secondary | ICD-10-CM | POA: Diagnosis not present

## 2017-09-01 DIAGNOSIS — M4807 Spinal stenosis, lumbosacral region: Secondary | ICD-10-CM | POA: Diagnosis not present

## 2017-09-01 DIAGNOSIS — G8929 Other chronic pain: Secondary | ICD-10-CM | POA: Diagnosis not present

## 2017-09-01 NOTE — Progress Notes (Signed)
Office Visit Note   Patient: Jacob Harrison           Date of Birth: May 06, 1940           MRN: [id number] Visit Date: 09/01/2017              Requested by: Esaw Grandchild, NP [place], [place] [address] PCP: Esaw Grandchild, NP   Assessment & Plan: Visit Diagnoses:  1. Chronic bilateral low back pain, with sciatica presence unspecified   2. Spinal stenosis of lumbosacral region     Plan: Patient has neurogenic claudication symptoms.  We will proceed with lumbar MRI scan imaging to evaluate him for spinal stenosis.  Office follow-up after scan for review.  Follow-Up Instructions: Office follow-up after MRI scan lumbar to rule out lumbar spinal stenosis.  Orders:  Orders Placed This Encounter  Procedures  . XR Lumbar Spine 2-3 Views  . MR Lumbar Spine w/o contrast   No orders of the defined types were placed in this encounter.     Procedures: No procedures performed   Clinical Data: No additional findings.   Subjective: Chief Complaint  Patient presents with  . Lower Back - Pain    HPI 77 year old male seen with neurogenic claudication symptoms that occur if he stands for 20 minutes walks more than half a block.  He does well if he leans over a grocery cart and has numbness in his legs that progressed he states he has to sit down otherwise he is concerned he will fall.  Numbness in his legs below the knees have progressed in the last 6 months.  Previous epidural injections October 2018 with some relief.  Patient does have diabetes did not have hyperglycemia with the injection.  BMI elevated at 32.  Past history of coronary artery disease.  Review of Systems 14 point review of systems positive for vitamin D deficiency, hyperlipidemia type 2 diabetes, coronary artery disease, hypertension.  History of ischemic cardiomyopathy.  Neurogenic claudication symptoms.  Normal ABIs 2 years ago.  History of bronchitis bradycardia neck pain.  Heart failure with  preserved ejection fraction.  Otherwise negative as it pertains HPI.   Objective: Vital Signs: BP 135/79   Pulse 67   Ht 5\' 7"  (1.702 m)   Wt 200 lb (90.7 kg)   BMI 31.32 kg/m   Physical Exam  Constitutional: He is oriented to person, place, and time. He appears well-developed and well-nourished.  HENT:  Head: Normocephalic and atraumatic.  Eyes: Pupils are equal, round, and reactive to light. EOM are normal.  Neck: No tracheal deviation present. No thyromegaly present.  Cardiovascular: Normal rate.  Pulmonary/Chest: Effort normal. He has no wheezes.  Abdominal: Soft. Bowel sounds are normal.  Neurological: He is alert and oriented to person, place, and time.  Skin: Skin is warm and dry. Capillary refill takes less than 2 seconds.  Psychiatric: He has a normal mood and affect. His behavior is normal. Judgment and thought content normal.    Ortho Exam patient is amatory has palpable dorsalis pedis posterior tib symmetrical.  Gastrocsoleus weakness.  Mild sciatic notch tenderness.  Negative straight leg raising negative degrees quads hip flexors are strong negative logroll to the hips.  Specialty Comments:  No specialty comments available.  Imaging: Xr Lumbar Spine 2-3 Views  Result Date: 09/01/2017 The lateral lumbar spine shows multilevel disc space narrowing with degenerative scoliosis and endplate osteophytes.  Upper lumbar curvature.  Negative for compression fracture. 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  Significant spurring noted.    PMFS History: Patient Active Problem List   Diagnosis Date Noted  . Atypical mole 07/30/2017  . Healthcare maintenance 07/30/2017  . Wheezing 07/21/2017  . Acute bronchitis 07/21/2017  . Neck pain 06/08/2017  . Sinus bradycardia 03/18/2017  . Screening for colon cancer 02/25/2017  . Ventral hernia without obstruction or gangrene 02/25/2017  . (HFpEF) heart failure with preserved ejection fraction ([place])  09/28/2016  . Ischemic cardiomyopathy 09/28/2016  . Cough 07/22/2016  . Vitamin D deficiency 06/27/2016  . Vitamin B deficiency 06/27/2016  . Hyperlipidemia LDL goal <70 06/21/2016  . Weakness of both lower extremities 06/21/2016  . Diabetes mellitus without complication ([place]) 74/25/9563  . Essential hypertension 05/20/2016  . Hyperlipidemia associated with type 2 diabetes mellitus ([place]) 05/20/2016  . Coronary artery disease 05/20/2016   Past Medical History:  Diagnosis Date  . BPH (benign prostatic hyperplasia)   . CHF (congestive heart failure) ([place])   . Coronary artery disease   . Diabetes mellitus without complication ([place])   . Hyperlipidemia   . Scoliosis   . Spinal stenosis     Family History  Problem Relation Age of Onset  . Diabetes Mother   . Hyperlipidemia Mother   . Cancer Father        colon  . Alcohol abuse Maternal Uncle   . Diabetes Maternal Uncle   . Hyperlipidemia Maternal Uncle   . Cancer Paternal Uncle        colon    Past Surgical History:  Procedure Laterality Date  . CORONARY ARTERY BYPASS GRAFT     x 2 (2001 and redo in late 2011 or early 2012)  . HERNIA REPAIR Left    inguinal  . TONSILLECTOMY AND ADENOIDECTOMY    . TRANSURETHRAL RESECTION OF PROSTATE  1990   Social History   Occupational History  . Not on file  Tobacco Use  . Smoking status: Never Smoker  . Smokeless tobacco: Never Used  Substance and Sexual Activity  . Alcohol use: Yes    Alcohol/week: 6.6 oz    Types: 5 Glasses of wine, 6 Cans of beer per week  . Drug use: No  . Sexual activity: Not Currently    Birth control/protection: None

## 2017-09-08 ENCOUNTER — Telehealth (INDEPENDENT_AMBULATORY_CARE_PROVIDER_SITE_OTHER): Payer: Self-pay | Admitting: Orthopaedic Surgery

## 2017-09-08 NOTE — Telephone Encounter (Signed)
Patient called and left voicemail wanting to speak with you. If you could give him a call back at [phone]

## 2017-09-08 NOTE — Telephone Encounter (Signed)
I left voicemail for patient. Will try again this afternoon.

## 2017-09-12 ENCOUNTER — Other Ambulatory Visit: Payer: Self-pay | Admitting: Internal Medicine

## 2017-09-12 DIAGNOSIS — I25118 Atherosclerotic heart disease of native coronary artery with other forms of angina pectoris: Secondary | ICD-10-CM

## 2017-09-15 NOTE — Telephone Encounter (Signed)
Left voicemail x 2 for patient. Will wait for return call.

## 2017-09-22 ENCOUNTER — Ambulatory Visit
Admission: RE | Admit: 2017-09-22 | Discharge: 2017-09-22 | Disposition: A | Discharge status: Home / Self Care | Payer: Medicare Other | Source: Ambulatory Visit | Attending: Orthopaedic Surgery | Admitting: Orthopaedic Surgery

## 2017-09-22 DIAGNOSIS — G8929 Other chronic pain: Secondary | ICD-10-CM

## 2017-09-22 DIAGNOSIS — M545 Low back pain: Principal | ICD-10-CM

## 2017-09-23 ENCOUNTER — Ambulatory Visit (INDEPENDENT_AMBULATORY_CARE_PROVIDER_SITE_OTHER): Payer: Medicare Other | Admitting: Orthopaedic Surgery

## 2017-09-23 ENCOUNTER — Encounter (INDEPENDENT_AMBULATORY_CARE_PROVIDER_SITE_OTHER): Payer: Self-pay | Admitting: Orthopaedic Surgery

## 2017-09-23 VITALS — BP 117/74 | HR 46 | Ht 67.0 in | Wt 200.0 lb

## 2017-09-23 DIAGNOSIS — M545 Low back pain: Secondary | ICD-10-CM | POA: Diagnosis not present

## 2017-09-23 DIAGNOSIS — G8929 Other chronic pain: Secondary | ICD-10-CM

## 2017-09-23 DIAGNOSIS — M4807 Spinal stenosis, lumbosacral region: Secondary | ICD-10-CM

## 2017-09-26 ENCOUNTER — Other Ambulatory Visit: Payer: Self-pay | Admitting: Adult Health

## 2017-09-29 ENCOUNTER — Encounter (INDEPENDENT_AMBULATORY_CARE_PROVIDER_SITE_OTHER): Payer: Self-pay | Admitting: Orthopaedic Surgery

## 2017-09-29 NOTE — Progress Notes (Signed)
Office Visit Note   Patient: Jacob Harrison           Date of Birth: Mar 05, 1941           MRN: [id number] Visit Date: 09/23/2017              Requested by: Esaw Grandchild, NP [place], [place] [id number] PCP: Esaw Grandchild, NP   Assessment & Plan: Visit Diagnoses:  1. Chronic bilateral low back pain, with sciatica presence unspecified   2. Spinal stenosis of lumbosacral region     Plan: Patient has moderately severe stenosis at L2-3.  Severe stenosis at L3-4 and severe spinal stenosis at L4-5.  Large central disc osteophyte complex at L5-S1 with mild displacement of the right L5 nerve root which is not consistent with his symptoms.  We will proceed with lumbar epidural for his L3-4 and L4-5 stenosis with left greater than right leg pain.  Office follow-up after epidural.  Follow-Up Instructions: No follow-ups on file.   Orders:  Orders Placed This Encounter  Procedures  . Ambulatory referral to Physical Medicine Rehab   No orders of the defined types were placed in this encounter.     Procedures: No procedures performed   Clinical Data: No additional findings.   Subjective: Chief Complaint  Patient presents with  . Lower Back - Pain, Follow-up    MRI lumbar    HPI` 77 year old male returns with neurogenic claudication symptoms after standing for 20 minutes or walking more than half a block.  He does well leaning over a grocery cart.  MRI scan has been obtained and is available for review.  He has had progression of his symptoms in the last 6 months.  Previous epidural in October 2018 gave him relief.  He has diabetes but did not have hyperglycemia with the injection.  He has used Aleve occasionally.  Review of Systems 14 point review of systems updated unchanged from 09/01/2017 other than as mentioned in HPI.   Objective: Vital Signs: BP 117/74   Pulse (!) 46   Ht 5\' 7"  (1.702 m)   Wt 200 lb (90.7 kg)   BMI 31.32 kg/m   Physical Exam    Constitutional: He is oriented to person, place, and time. He appears well-developed and well-nourished.  HENT:  Head: Normocephalic and atraumatic.  Eyes: Pupils are equal, round, and reactive to light. EOM are normal.  Neck: No tracheal deviation present. No thyromegaly present.  Cardiovascular: Normal rate.  Pulmonary/Chest: Effort normal. He has no wheezes.  Abdominal: Soft. Bowel sounds are normal.  Neurological: He is alert and oriented to person, place, and time.  Skin: Skin is warm and dry. Capillary refill takes less than 2 seconds.  Psychiatric: He has a normal mood and affect. His behavior is normal. Judgment and thought content normal.    Ortho Exam patient can ambulate without limp.  No isolated motor weakness lower extremities negative straight leg raising.  Palpable dorsalis pedis and posterior tibial pulse right and left.  Intact reflexes. Specialty Comments:  No specialty comments available.  Imaging:CLINICAL DATA:  Chronic low back pain  EXAM: MRI LUMBAR SPINE WITHOUT CONTRAST  TECHNIQUE: Multiplanar, multisequence MR imaging of the lumbar spine was performed. No intravenous contrast was administered.  COMPARISON:  Lumbar spine radiographs 09/01/2017  FINDINGS: Segmentation:  Normal  Alignment:  Normal  Vertebrae:  Negative for fracture or mass.  Normal bone marrow.  Conus medullaris and cauda equina: Conus extends to the L1-2  level. Conus and cauda equina appear normal.  Paraspinal and other soft tissues: Negative for paraspinous mass or soft tissue swelling  Disc levels:  T12-L1: Negative  L1-2: Mild disc and mild facet degeneration with mild spinal stenosis  L2-3: Moderate disc bulging and mild facet hypertrophy. Moderately severe spinal stenosis. Neural foramina patent bilaterally. Subarticular stenosis left greater than right  L3-4: Diffuse bulging of the disc with endplate spurring. Moderate facet and ligamentum flavum  hypertrophy causing severe spinal stenosis. Marked subarticular stenosis bilaterally.  L4-5: Severe spinal stenosis. Diffuse disc bulging and endplate spurring right greater than left. Endplate edema on the right. Moderate facet and ligamentum flavum hypertrophy bilaterally. Marked subarticular stenosis bilaterally.  L5-S1: Large central disc and osteophyte complex with associated disc space narrowing. This appears chronic. There is displacement of the right S1 nerve root which is not compressed. Subarticular stenosis due to spurring bilaterally.  IMPRESSION: Extensive multilevel lumbar degenerative change.  Mild spinal stenosis L2-3  Moderately severe spinal stenosis L2-3 with subarticular stenosis left greater than right  Severe spinal stenosis L3-4 with marked subarticular stenosis bilaterally  Severe spinal stenosis L4-5 with marked subarticular stenosis bilaterally  Large central disc and osteophyte complex with subarticular stenosis bilaterally. Mild displacement of the right S1 nerve root.   Electronically Signed   By: Franchot Gallo M.D.   On: 09/22/2017 10:44     PMFS History: Patient Active Problem List   Diagnosis Date Noted  . Atypical mole 07/30/2017  . Healthcare maintenance 07/30/2017  . Wheezing 07/21/2017  . Acute bronchitis 07/21/2017  . Neck pain 06/08/2017  . Sinus bradycardia 03/18/2017  . Screening for colon cancer 02/25/2017  . Ventral hernia without obstruction or gangrene 02/25/2017  . (HFpEF) heart failure with preserved ejection fraction ([place]) 09/28/2016  . Ischemic cardiomyopathy 09/28/2016  . Cough 07/22/2016  . Vitamin D deficiency 06/27/2016  . Vitamin B deficiency 06/27/2016  . Hyperlipidemia LDL goal <70 06/21/2016  . Weakness of both lower extremities 06/21/2016  . Diabetes mellitus without complication ([place]) 34/19/3790  . Essential hypertension 05/20/2016  . Hyperlipidemia associated with type 2 diabetes mellitus  ([place]) 05/20/2016  . Coronary artery disease 05/20/2016   Past Medical History:  Diagnosis Date  . BPH (benign prostatic hyperplasia)   . CHF (congestive heart failure) ([place])   . Coronary artery disease   . Diabetes mellitus without complication ([place])   . Hyperlipidemia   . Scoliosis   . Spinal stenosis     Family History  Problem Relation Age of Onset  . Diabetes Mother   . Hyperlipidemia Mother   . Cancer Father        colon  . Alcohol abuse Maternal Uncle   . Diabetes Maternal Uncle   . Hyperlipidemia Maternal Uncle   . Cancer Paternal Uncle        colon    Past Surgical History:  Procedure Laterality Date  . CORONARY ARTERY BYPASS GRAFT     x 2 (2001 and redo in late 2011 or early 2012)  . HERNIA REPAIR Left    inguinal  . TONSILLECTOMY AND ADENOIDECTOMY    . TRANSURETHRAL RESECTION OF PROSTATE  1990   Social History   Occupational History  . Not on file  Tobacco Use  . Smoking status: Never Smoker  . Smokeless tobacco: Never Used  Substance and Sexual Activity  . Alcohol use: Yes    Alcohol/week: 6.6 oz    Types: 5 Glasses of wine, 6 Cans of beer per week  .  Drug use: No  . Sexual activity: Not Currently    Birth control/protection: None

## 2017-10-13 ENCOUNTER — Other Ambulatory Visit: Payer: Self-pay | Admitting: Internal Medicine

## 2017-10-13 DIAGNOSIS — I25118 Atherosclerotic heart disease of native coronary artery with other forms of angina pectoris: Secondary | ICD-10-CM

## 2017-10-22 ENCOUNTER — Ambulatory Visit (INDEPENDENT_AMBULATORY_CARE_PROVIDER_SITE_OTHER): Payer: Self-pay

## 2017-10-22 ENCOUNTER — Encounter

## 2017-10-22 ENCOUNTER — Encounter (INDEPENDENT_AMBULATORY_CARE_PROVIDER_SITE_OTHER): Payer: Self-pay | Admitting: Physical Medicine and Rehabilitation

## 2017-10-22 ENCOUNTER — Ambulatory Visit (INDEPENDENT_AMBULATORY_CARE_PROVIDER_SITE_OTHER): Payer: Medicare Other | Admitting: Physical Medicine and Rehabilitation

## 2017-10-22 VITALS — BP 136/74 | HR 58

## 2017-10-22 DIAGNOSIS — M5416 Radiculopathy, lumbar region: Secondary | ICD-10-CM | POA: Diagnosis not present

## 2017-10-22 DIAGNOSIS — M48062 Spinal stenosis, lumbar region with neurogenic claudication: Secondary | ICD-10-CM | POA: Diagnosis not present

## 2017-10-22 MED ORDER — BETAMETHASONE SOD PHOS & ACET 6 (3-3) MG/ML IJ SUSP
12.0000 mg | Freq: Once | INTRAMUSCULAR | Status: AC
  Administered 2017-10-22: 12 mg

## 2017-10-22 NOTE — Patient Instructions (Signed)

## 2017-10-22 NOTE — Progress Notes (Signed)
 .  Numeric Pain Rating Scale and Functional Assessment Average Pain 6   In the last MONTH (on 0-10 scale) has pain interfered with the following?  1. General activity like being  able to carry out your everyday physical activities such as walking, climbing stairs, carrying groceries, or moving a chair?  Rating(3)   +Driver, -BT, -Dye Allergies.  

## 2017-10-28 ENCOUNTER — Ambulatory Visit: Payer: Medicare Other | Admitting: Nurse Practitioner

## 2017-10-28 ENCOUNTER — Encounter: Payer: Self-pay | Admitting: Nurse Practitioner

## 2017-10-28 VITALS — BP 120/70 | HR 67 | Ht 67.0 in | Wt 205.2 lb

## 2017-10-28 DIAGNOSIS — I5032 Chronic diastolic (congestive) heart failure: Secondary | ICD-10-CM

## 2017-10-28 DIAGNOSIS — R011 Cardiac murmur, unspecified: Secondary | ICD-10-CM | POA: Diagnosis not present

## 2017-10-28 DIAGNOSIS — E785 Hyperlipidemia, unspecified: Secondary | ICD-10-CM

## 2017-10-28 DIAGNOSIS — I2510 Atherosclerotic heart disease of native coronary artery without angina pectoris: Secondary | ICD-10-CM

## 2017-10-28 NOTE — Patient Instructions (Signed)
Medication Instructions:  Your physician recommends that you continue on your current medications as directed. Please refer to the Current Medication list given to you today.   Labwork: none  Testing/Procedures: none  Follow-Up: Your physician recommends that you schedule a follow-up appointment in: 6 MONTHS WITH DR END.   If you need a refill on your cardiac medications before your next appointment, please call your pharmacy.   

## 2017-10-28 NOTE — Progress Notes (Signed)
Office Visit    Patient Name: Jacob Harrison Date of Encounter: 10/28/2017  Primary Care Provider:  Esaw Grandchild, NP Primary Cardiologist:  Nelva Bush, MD  Chief Complaint    77 year old male with a history of CAD status post CABG and redo CABG, diabetes, HFpEF, hyperlipidemia, and chronic leg pain in the setting of spinal stenosis, who presents for follow-up.  Past Medical History    Past Medical History:  Diagnosis Date  . (HFpEF) heart failure with preserved ejection fraction ([place])    a. 10/2016 Echo: EF 65-70%, mild LVH, mildly dil LA. RV fxn nl.  Marland Kitchen BPH (benign prostatic hyperplasia)   . Coronary artery disease    a. 2001 CABG x 3 [facility]): LIMA->LAD, VG->LCX, VG->RPDA; b. 04/05/2010 Cath ([place]): LM 76m, LAD 169m, LCX 28m, RCA 100p, LIMA->LAD atretic, VG->LCX 100, VG->RCA 80ost, 70d; c. 04/2010 Redo CABG x 2 ([place]): VG->LAD, VG->RPDA; d. 10/2012 Cath [facility]): LM 20-30, LAD 100/95-28m, LCX 40-5m, RCA 100p, 80-100d, RPDA 99, RPL1 95, VG->LAD ok, VG->RPDA ok, EF 67%; e. 11/2016 MV: EF 48%, No ischemia.  . Diabetes mellitus without complication ([place])   . Hyperlipidemia   . Scoliosis   . Spinal stenosis    Past Surgical History:  Procedure Laterality Date  . CORONARY ARTERY BYPASS GRAFT     x 2 (2001 and redo in late 2011 or early 2012)  . HERNIA REPAIR Left    inguinal  . TONSILLECTOMY AND ADENOIDECTOMY    . TRANSURETHRAL RESECTION OF PROSTATE  1990    Allergies  Allergies  Allergen Reactions  . Tetracyclines & Related Rash    History of Present Illness    77 year old male with the above complex past medical history including coronary artery disease status post CABG x3 in [place] in 2001 followed by redo CABG x2 in [place] in January 2012.  His last catheterization was in July 2014 and revealed native multivessel disease with patent grafts to the LAD and RPDA.  Other history includes hyperlipidemia, diabetes, BPH, HFpEF, obesity, and  spinal stenosis.  In July 2018, he underwent stress testing which was low risk.  Echocardiogram in August 2018 showed normal LV function.  Since his last visit here in December 2018, he has done well from a cardiac standpoint and does not experience chest pain or dyspnea with routine activities.  He says he has not taken sublingual nitroglycerin in over a year.  He thinks Ranexa has helped him very much.  He has been having issues with bilateral lower extremity pain and numbness in the setting of spinal stenosis.  This does improve some with spinal injections which he has been receiving every 3 to 4 months.  He thinks at some point he will require surgical intervention but hopes to be able to put this off at least until next summer or so.  He denies palpitations, PND, orthopnea, dizziness, syncope, edema, or early satiety.  Home Medications    Prior to Admission medications   Medication Sig Start Date End Date Taking? Authorizing Provider  Albuterol Sulfate (PROAIR RESPICLICK) 962 (90 Base) MCG/ACT AEPB Inhale 2 puffs into the lungs every 6 (six) hours as needed. 07/21/17  Yes Danford, Katy D, NP  aspirin EC 81 MG tablet Take 1 tablet (81 mg total) by mouth daily. 09/26/16  Yes End, Harrell Gave, MD  carvedilol (COREG) 6.25 MG tablet Take 1 tablet by mouth 2 (two) times daily. 02/23/17  Yes [provider]  Cholecalciferol (VITAMIN D-3 PO) Take 800  Units by mouth daily.   Yes [provider]  Coenzyme Q10 (CO Q-10) 200 MG CAPS Take 1 capsule by mouth daily.   Yes [provider]  cyanocobalamin 500 MCG tablet Take 500 mcg by mouth daily.   Yes [provider]  ezetimibe (ZETIA) 10 MG tablet TAKE 1 TABLET BY MOUTH ONCE DAILY 06/10/17  Yes Danford, Katy D, NP  finasteride (PROSCAR) 5 MG tablet TAKE 1 TABLET BY MOUTH ONCE DAILY 08/17/17  Yes Danford, Katy D, NP  Glucosamine-Chondroit-Vit C-Mn (GLUCOSAMINE 1500 COMPLEX) CAPS Take 1 capsule by mouth daily.   Yes [provider]  metFORMIN (GLUCOPHAGE) 500 MG tablet Take 1 tablet (500 mg total) by mouth daily. 11/27/16  Yes Danford, Katy D, NP  Multiple Vitamin (MULTIVITAMIN) tablet Take 1 tablet by mouth daily.   Yes [provider]  Multiple Vitamins-Minerals (ICAPS AREDS 2) CAPS Take 2 tablets by mouth daily.   Yes [provider]  nitroGLYCERIN (NITROSTAT) 0.4 MG SL tablet Place 1 tablet under the tongue as needed. 04/09/16  Yes [provider]  ranolazine (RANEXA) 1000 MG SR tablet Take 1 tablet (1,000 mg total) by mouth 2 (two) times daily. 10/14/17  Yes End, Harrell Gave, MD  rosuvastatin (CRESTOR) 20 MG tablet TAKE 1 TABLET BY MOUTH AT BEDTIME 09/28/17  Yes Danford, Valetta Fuller D, NP    Review of Systems    Activity limited by bilat LE pain/numbness.  Now using a cane.  He denies chest pain, palpitations, dyspnea, pnd, orthopnea, n, v, dizziness, syncope, edema, weight gain, or early satiety.  All other systems reviewed and are otherwise negative except as noted above.  Physical Exam    VS:  BP 120/70 (BP Location: Left Arm, Patient Position: Sitting, Cuff Size: Normal)   Pulse 67   Ht 5\' 7"  (1.702 m)   Wt 205 lb 4 oz (93.1 kg)   BMI 32.15 kg/m  , BMI Body mass index is 32.15 kg/m. GEN: Well nourished, well developed, in no acute distress.  HEENT: normal.  Neck: Supple, no JVD, carotid bruits, or masses. Cardiac: RRR, 2/6 SEM @ upper sternal borders and heard throughout.  No rubs, or gallops. No clubbing, cyanosis, edema.  Radials/DP/PT 2+ and equal bilaterally.  Respiratory:  Respirations regular and unlabored, clear to auscultation bilaterally. GI: Soft, nontender, nondistended, BS + x 4. MS: no deformity or atrophy. Skin: warm and dry, no rash. Neuro:  Strength and sensation are intact. Psych: Normal affect.  Accessory Clinical Findings    ECG -regular sinus rhythm, 68, no acute ST or T changes.  Assessment & Plan    1.  Coronary artery disease: Status post prior  CABG and redo CABG with patent vein graft to the LAD and vein graft to the RPDA on his last catheterization 2014.  He had a low risk Myoview in July 2018 with normal LV function by echo in August 2018.  Since his last visit in December 2018, he has done well from a cardiac standpoint.  Activity has been limited by lower back and leg pain/numbness but has not had any chest pain or dyspnea.  Heart rate and blood pressure remain well controlled.  He is on aspirin, beta-blocker, statin, Zetia, and Ranexa therapy.  2.  HFpEF: Euvolemic today.  Heart rate and blood pressure well controlled on beta-blocker therapy.  3.  Hyperlipidemia: LDL 65 in April of this year with normal LFTs.  Continue statin and Zetia.  4.  History of bradycardia: Rates have been  stable on current dose of carvedilol.  Rate 67 today.  5.  Systolic murmur: Patient with systolic murmur on exam heard at the upper sternal borders.  He says that he was told he had a murmur a long time ago and that this is nothing new.  I do not see report of this in prior cardiology and primary care notes.  Regardless, he had an echo last year that did not show any significant valvular disease or evidence of obstructive anatomy.  As above, he has been asymptomatic from a cardiac standpoint.  Would likely plan to follow-up in echo next year.  6.  Bilateral lower extremity pain/numbness/spinal stenosis: Followed closely by Ortho.  Currently improved after recent steroid injection.  7.  Disposition: f/u in clinic in six mos or sooner if necessary.  Murray Hodgkins, NP 10/28/2017, 11:28 AM

## 2017-11-04 NOTE — Procedures (Signed)
Lumbosacral Transforaminal Epidural Steroid Injection - Sub-Pedicular Approach with Fluoroscopic Guidance  Patient: Jacob Harrison      Date of Birth: [birth year] MRN: [id number] PCP: Esaw Grandchild, NP      Visit Date: 10/22/2017   Universal Protocol:    Date/Time: 10/22/2017  Consent Given By: the patient  Position: PRONE  Additional Comments: Vital signs were monitored before and after the procedure. Patient was prepped and draped in the usual sterile fashion. The correct patient, procedure, and site was verified.   Injection Procedure Details:  Procedure Site One Meds Administered:  Meds ordered this encounter  Medications  . betamethasone acetate-betamethasone sodium phosphate (CELESTONE) injection 12 mg    Laterality: Bilateral  Location/Site:  L4-L5  Needle size: 22 G  Needle type: Spinal  Needle Placement: Transforaminal  Findings:    -Comments: Excellent flow of contrast along the nerve and into the epidural space.  Procedure Details: After squaring off the end-plates to get a true AP view, the C-arm was positioned so that an oblique view of the foramen as noted above was visualized. The target area is just inferior to the "nose of the scotty dog" or sub pedicular. The soft tissues overlying this structure were infiltrated with 2-3 ml. of 1% Lidocaine without Epinephrine.  The spinal needle was inserted toward the target using a "trajectory" view along the fluoroscope beam.  Under AP and lateral visualization, the needle was advanced so it did not puncture dura and was located close the 6 O'Clock position of the pedical in AP tracterory. Biplanar projections were used to confirm position. Aspiration was confirmed to be negative for CSF and/or blood. A 1-2 ml. volume of Isovue-250 was injected and flow of contrast was noted at each level. Radiographs were obtained for documentation purposes.   After attaining the desired flow of contrast documented above, a 0.5  to 1.0 ml test dose of 0.25% Marcaine was injected into each respective transforaminal space.  The patient was observed for 90 seconds post injection.  After no sensory deficits were reported, and normal lower extremity motor function was noted,   the above injectate was administered so that equal amounts of the injectate were placed at each foramen (level) into the transforaminal epidural space.   Additional Comments:  The patient tolerated the procedure well Dressing: Band-Aid    Post-procedure details: Patient was observed during the procedure. Post-procedure instructions were reviewed.  Patient left the clinic in stable condition.

## 2017-11-04 NOTE — Progress Notes (Signed)
Jacob Harrison - 77 y.o. male MRN [id number]  Date of birth: 27-Oct-1940  Office Visit Note: Visit Date: 10/22/2017 PCP: Jacob Grandchild, NP Referred by: Jacob Grandchild, NP  Subjective: Chief Complaint  Patient presents with  . Lower Back - Pain  . Right Leg - Pain  . Left Leg - Pain   HPI: [name] is a 77 year old gentleman that comes in today at the request of Jacob Harrison for interventional spine procedure.  He is having low back pain and bilateral radicular pain with standing and walking consistent with lumbar stenosis.  Patient has MRI findings of severe lumbar stenosis at L3-4 and L4-5 which is multifactorial.  He also has a large disc osteophyte at L5-S1 centrally and to the right.  We are going to complete today diagnostic note for therapeutic bilateral L4 transforaminal epidural steroid injection.   ROS Otherwise per HPI.  Assessment & Plan: Visit Diagnoses:  1. Lumbar radiculopathy   2. Spinal stenosis of lumbar region with neurogenic claudication     Plan: No additional findings.   Meds & Orders:  Meds ordered this encounter  Medications  . betamethasone acetate-betamethasone sodium phosphate (CELESTONE) injection 12 mg    Orders Placed This Encounter  Procedures  . XR C-ARM NO REPORT  . Epidural Steroid injection    Follow-up: Return if symptoms worsen or fail to improve.   Procedures: No procedures performed  No notes on file   Clinical History: MRI LUMBAR SPINE WITHOUT CONTRAST  TECHNIQUE: Multiplanar, multisequence MR imaging of the lumbar spine was performed. No intravenous contrast was administered.  COMPARISON:  Lumbar spine radiographs 09/01/2017  FINDINGS: Segmentation:  Normal  Alignment:  Normal  Vertebrae:  Negative for fracture or mass.  Normal bone marrow.  Conus medullaris and cauda equina: Conus extends to the L1-2 level. Conus and cauda equina appear normal.  Paraspinal and other soft tissues: Negative for  paraspinous mass or soft tissue swelling  Disc levels:  T12-L1: Negative  L1-2: Mild disc and mild facet degeneration with mild spinal stenosis  L2-3: Moderate disc bulging and mild facet hypertrophy. Moderately severe spinal stenosis. Neural foramina patent bilaterally. Subarticular stenosis left greater than right  L3-4: Diffuse bulging of the disc with endplate spurring. Moderate facet and ligamentum flavum hypertrophy causing severe spinal stenosis. Marked subarticular stenosis bilaterally.  L4-5: Severe spinal stenosis. Diffuse disc bulging and endplate spurring right greater than left. Endplate edema on the right. Moderate facet and ligamentum flavum hypertrophy bilaterally. Marked subarticular stenosis bilaterally.  L5-S1: Large central disc and osteophyte complex with associated disc space narrowing. This appears chronic. There is displacement of the right S1 nerve root which is not compressed. Subarticular stenosis due to spurring bilaterally.  IMPRESSION: Extensive multilevel lumbar degenerative change.  Mild spinal stenosis L2-3  Moderately severe spinal stenosis L2-3 with subarticular stenosis left greater than right  Severe spinal stenosis L3-4 with marked subarticular stenosis bilaterally  Severe spinal stenosis L4-5 with marked subarticular stenosis bilaterally  Large central disc and osteophyte complex with subarticular stenosis bilaterally. Mild displacement of the right S1 nerve root.   Electronically Signed   By: Jacob Harrison M.D.   On: 09/22/2017 10:44  2-3 view lumbar spine x-ray dated 12/30/2016  AP lateral lumbar x-rays are obtained and reviewed. This shows changes  consistent with ankylosing spondylitis with the lateral and anterior  bridging osteophytes the upper and mid level lumbar region. He has some  curvature less than 20 the lumbar spine. Facet arthropathy  is noted.  Impression: Lumbar disc degeneration with  ankylosis and significant  endplate spurring laterally and anteriorly at multiple levels   He reports that he has never smoked. He has never used smokeless tobacco.  Recent Labs    02/25/17 0857 07/30/17 0909 07/30/17 0920  HGBA1C 6.0 6.0 6.1*    Objective:  VS:  HT:    WT:   BMI:     BP:136/74  HR:(!) 58bpm  TEMP: ( )  RESP:  Physical Exam  Ortho Exam Imaging: No results found.  Past Medical/Family/Surgical/Social History: Medications & Allergies reviewed per EMR, new medications updated. Patient Active Problem List   Diagnosis Date Noted  . Atypical mole 07/30/2017  . Healthcare maintenance 07/30/2017  . Wheezing 07/21/2017  . Acute bronchitis 07/21/2017  . Neck pain 06/08/2017  . Sinus bradycardia 03/18/2017  . Screening for colon cancer 02/25/2017  . Ventral hernia without obstruction or gangrene 02/25/2017  . (HFpEF) heart failure with preserved ejection fraction ([place]) 09/28/2016  . Ischemic cardiomyopathy 09/28/2016  . Cough 07/22/2016  . Vitamin D deficiency 06/27/2016  . Vitamin B deficiency 06/27/2016  . Hyperlipidemia LDL goal <70 06/21/2016  . Weakness of both lower extremities 06/21/2016  . Diabetes mellitus without complication ([place]) 20/25/4270  . Essential hypertension 05/20/2016  . Hyperlipidemia associated with type 2 diabetes mellitus ([place]) 05/20/2016  . Coronary artery disease 05/20/2016   Past Medical History:  Diagnosis Date  . (HFpEF) heart failure with preserved ejection fraction ([place])    a. 10/2016 Echo: EF 65-70%, mild LVH, mildly dil LA. RV fxn nl.  Marland Kitchen BPH (benign prostatic hyperplasia)   . Coronary artery disease    a. 2001 CABG x 3 [facility]): LIMA->LAD, VG->LCX, VG->RPDA; b. 04/05/2010 Cath ([place]): LM 9m, LAD 160m, LCX 46m, RCA 100p, LIMA->LAD atretic, VG->LCX 100, VG->RCA 80ost, 70d; c. 04/2010 Redo CABG x 2 ([place]): VG->LAD, VG->RPDA; d. 10/2012 Cath [facility]): LM 20-30, LAD 100/95-62m, LCX 40-39m, RCA 100p, 80-100d, RPDA 99, RPL1  95, VG->LAD ok, VG->RPDA ok, EF 67%; e. 11/2016 MV: EF 48%, No ischemia.  . Diabetes mellitus without complication ([place])   . Hyperlipidemia   . Scoliosis   . Spinal stenosis    Family History  Problem Relation Age of Onset  . Diabetes Mother   . Hyperlipidemia Mother   . Cancer Father        colon  . Alcohol abuse Maternal Uncle   . Diabetes Maternal Uncle   . Hyperlipidemia Maternal Uncle   . Cancer Paternal Uncle        colon   Past Surgical History:  Procedure Laterality Date  . CORONARY ARTERY BYPASS GRAFT     x 2 (2001 and redo in late 2011 or early 2012)  . HERNIA REPAIR Left    inguinal  . TONSILLECTOMY AND ADENOIDECTOMY    . TRANSURETHRAL RESECTION OF PROSTATE  1990   Social History   Occupational History  . Not on file  Tobacco Use  . Smoking status: Never Smoker  . Smokeless tobacco: Never Used  Substance and Sexual Activity  . Alcohol use: Yes    Alcohol/week: 6.6 oz    Types: 5 Glasses of wine, 6 Cans of beer per week  . Drug use: No  . Sexual activity: Not Currently    Birth control/protection: None

## 2017-11-19 ENCOUNTER — Other Ambulatory Visit: Payer: Self-pay | Admitting: *Deleted

## 2017-11-19 ENCOUNTER — Telehealth: Payer: Self-pay | Admitting: Internal Medicine

## 2017-11-19 MED ORDER — CARVEDILOL 6.25 MG PO TABS: 6.2500 mg | ORAL_TABLET | Freq: Two times a day (BID) | ORAL | 2 refills | Status: DC

## 2017-11-19 NOTE — Telephone Encounter (Signed)
°*  STAT* If patient is at the pharmacy, call can be transferred to refill team.   1. Which medications need to be refilled? (please list name of each medication and dose if known) carvedilol (COREG) 6.25 1 tablet 2 times daily  2. Which pharmacy/location (including street and city if local pharmacy) is medication to be sent to? [facility] on [address]  3. Do they need a 30 day or 90 day supply? 90 day

## 2017-11-19 NOTE — Telephone Encounter (Signed)
Requested Prescriptions   Signed Prescriptions Disp Refills  . carvedilol (COREG) 6.25 MG tablet 180 tablet 2    Sig: Take 1 tablet (6.25 mg total) by mouth 2 (two) times daily.    Authorizing Provider: END, CHRISTOPHER    Ordering User: Tifini Reeder C    

## 2017-11-19 NOTE — Telephone Encounter (Signed)
Requested Prescriptions   Signed Prescriptions Disp Refills  . carvedilol (COREG) 6.25 MG tablet 180 tablet 2    Sig: Take 1 tablet (6.25 mg total) by mouth 2 (two) times daily.    Authorizing Provider: END, CHRISTOPHER    Ordering User: Murriel Holwerda C    

## 2017-12-07 ENCOUNTER — Other Ambulatory Visit: Payer: Self-pay | Admitting: Adult Health

## 2017-12-18 ENCOUNTER — Telehealth (INDEPENDENT_AMBULATORY_CARE_PROVIDER_SITE_OTHER): Payer: Self-pay | Admitting: Radiology

## 2017-12-18 NOTE — Telephone Encounter (Signed)
Patient left message requesting injection in back with similar symptoms as before. Last OV with Dr. Ernestina Patches 10/22/17 with bilateral lumbar TF injection. Is this ok or OV needed?  cb # [id number]

## 2017-12-21 NOTE — Telephone Encounter (Signed)
If helped then ok

## 2017-12-21 NOTE — Telephone Encounter (Signed)
Per [facility] website, prior Josem Kaufmann is not required for [facility] Medicare procedures done in office.

## 2017-12-21 NOTE — Telephone Encounter (Signed)
Pt is scheduled with driver 06/11/93

## 2018-01-07 ENCOUNTER — Encounter (INDEPENDENT_AMBULATORY_CARE_PROVIDER_SITE_OTHER): Payer: Self-pay | Admitting: Physical Medicine and Rehabilitation

## 2018-01-07 ENCOUNTER — Ambulatory Visit (INDEPENDENT_AMBULATORY_CARE_PROVIDER_SITE_OTHER): Payer: Self-pay

## 2018-01-07 ENCOUNTER — Ambulatory Visit (INDEPENDENT_AMBULATORY_CARE_PROVIDER_SITE_OTHER): Payer: Medicare Other | Admitting: Physical Medicine and Rehabilitation

## 2018-01-07 VITALS — BP 115/66 | HR 61 | Temp 98.3°F

## 2018-01-07 DIAGNOSIS — M5416 Radiculopathy, lumbar region: Secondary | ICD-10-CM | POA: Diagnosis not present

## 2018-01-07 DIAGNOSIS — M48062 Spinal stenosis, lumbar region with neurogenic claudication: Secondary | ICD-10-CM | POA: Diagnosis not present

## 2018-01-07 MED ORDER — BETAMETHASONE SOD PHOS & ACET 6 (3-3) MG/ML IJ SUSP
12.0000 mg | Freq: Once | INTRAMUSCULAR | Status: AC
  Administered 2018-01-07: 12 mg

## 2018-01-07 NOTE — Progress Notes (Signed)
  Numeric Pain Rating Scale and Functional Assessment Average Pain 6   In the last MONTH (on 0-10 scale) has pain interfered with the following?  1. General activity like being  able to carry out your everyday physical activities such as walking, climbing stairs, carrying groceries, or moving a chair?  Rating(5)   +Driver,  -Dye Allergies.

## 2018-01-07 NOTE — Procedures (Signed)
Lumbosacral Transforaminal Epidural Steroid Injection - Sub-Pedicular Approach with Fluoroscopic Guidance  Patient: Jacob Harrison      Date of Birth: [birth year] MRN: [id number] PCP: Esaw Grandchild, NP      Visit Date: 01/07/2018   Universal Protocol:    Date/Time: 01/07/2018  Consent Given By: the patient  Position: PRONE  Additional Comments: Vital signs were monitored before and after the procedure. Patient was prepped and draped in the usual sterile fashion. The correct patient, procedure, and site was verified.   Injection Procedure Details:  Procedure Site One Meds Administered:  Meds ordered this encounter  Medications  . betamethasone acetate-betamethasone sodium phosphate (CELESTONE) injection 12 mg    Laterality: Bilateral  Location/Site:  L4-L5  Needle size: 22 G  Needle type: Spinal  Needle Placement: Transforaminal  Findings:    -Comments: Excellent flow of contrast along the nerve and into the epidural space.  Procedure Details: After squaring off the end-plates to get a true AP view, the C-arm was positioned so that an oblique view of the foramen as noted above was visualized. The target area is just inferior to the "nose of the scotty dog" or sub pedicular. The soft tissues overlying this structure were infiltrated with 2-3 ml. of 1% Lidocaine without Epinephrine.  The spinal needle was inserted toward the target using a "trajectory" view along the fluoroscope beam.  Under AP and lateral visualization, the needle was advanced so it did not puncture dura and was located close the 6 O'Clock position of the pedical in AP tracterory. Biplanar projections were used to confirm position. Aspiration was confirmed to be negative for CSF and/or blood. A 1-2 ml. volume of Isovue-250 was injected and flow of contrast was noted at each level. Radiographs were obtained for documentation purposes.   After attaining the desired flow of contrast documented above, a 0.5  to 1.0 ml test dose of 0.25% Marcaine was injected into each respective transforaminal space.  The patient was observed for 90 seconds post injection.  After no sensory deficits were reported, and normal lower extremity motor function was noted,   the above injectate was administered so that equal amounts of the injectate were placed at each foramen (level) into the transforaminal epidural space.   Additional Comments:  The patient tolerated the procedure well Dressing: Band-Aid    Post-procedure details: Patient was observed during the procedure. Post-procedure instructions were reviewed.  Patient left the clinic in stable condition.

## 2018-01-07 NOTE — Patient Instructions (Signed)

## 2018-01-07 NOTE — Progress Notes (Signed)
Jacob Harrison - 77 y.o. male MRN [id number]  Date of birth: 11-15-1940  Office Visit Note: Visit Date: 01/07/2018 PCP: Esaw Grandchild, NP Referred by: Esaw Grandchild, NP  Subjective: Chief Complaint  Patient presents with  . Lower Back - Pain   HPI:  Jacob Harrison is a 77 y.o. male who comes in today for planned repeat bilateral L4 transforaminal epidural steroid injection.  Patient does have significant stenosis at this level.  Prior injection did give him quite a bit of relief and he became more mobile.  He said worsening over the last few weeks.  He is continue with physical therapy.  He is having pain in both legs.  Both sides are equal.  ROS Otherwise per HPI.  Assessment & Plan: Visit Diagnoses:  1. Lumbar radiculopathy   2. Spinal stenosis of lumbar region with neurogenic claudication     Plan: No additional findings.   Meds & Orders:  Meds ordered this encounter  Medications  . betamethasone acetate-betamethasone sodium phosphate (CELESTONE) injection 12 mg    Orders Placed This Encounter  Procedures  . XR C-ARM NO REPORT  . Epidural Steroid injection    Follow-up: No follow-ups on file.   Procedures: No procedures performed  Lumbosacral Transforaminal Epidural Steroid Injection - Sub-Pedicular Approach with Fluoroscopic Guidance  Patient: Jacob Harrison      Date of Birth: 05-30-1940 MRN: [id number] PCP: Esaw Grandchild, NP      Visit Date: 01/07/2018   Universal Protocol:    Date/Time: 01/07/2018  Consent Given By: the patient  Position: PRONE  Additional Comments: Vital signs were monitored before and after the procedure. Patient was prepped and draped in the usual sterile fashion. The correct patient, procedure, and site was verified.   Injection Procedure Details:  Procedure Site One Meds Administered:  Meds ordered this encounter  Medications  . betamethasone acetate-betamethasone sodium phosphate (CELESTONE) injection 12 mg     Laterality: Bilateral  Location/Site:  L4-L5  Needle size: 22 G  Needle type: Spinal  Needle Placement: Transforaminal  Findings:    -Comments: Excellent flow of contrast along the nerve and into the epidural space.  Procedure Details: After squaring off the end-plates to get a true AP view, the C-arm was positioned so that an oblique view of the foramen as noted above was visualized. The target area is just inferior to the "nose of the scotty dog" or sub pedicular. The soft tissues overlying this structure were infiltrated with 2-3 ml. of 1% Lidocaine without Epinephrine.  The spinal needle was inserted toward the target using a "trajectory" view along the fluoroscope beam.  Under AP and lateral visualization, the needle was advanced so it did not puncture dura and was located close the 6 O'Clock position of the pedical in AP tracterory. Biplanar projections were used to confirm position. Aspiration was confirmed to be negative for CSF and/or blood. A 1-2 ml. volume of Isovue-250 was injected and flow of contrast was noted at each level. Radiographs were obtained for documentation purposes.   After attaining the desired flow of contrast documented above, a 0.5 to 1.0 ml test dose of 0.25% Marcaine was injected into each respective transforaminal space.  The patient was observed for 90 seconds post injection.  After no sensory deficits were reported, and normal lower extremity motor function was noted,   the above injectate was administered so that equal amounts of the injectate were placed at each foramen (level) into the transforaminal epidural space.  Additional Comments:  The patient tolerated the procedure well Dressing: Band-Aid    Post-procedure details: Patient was observed during the procedure. Post-procedure instructions were reviewed.  Patient left the clinic in stable condition.     Clinical History: MRI LUMBAR SPINE WITHOUT  CONTRAST  TECHNIQUE: Multiplanar, multisequence MR imaging of the lumbar spine was performed. No intravenous contrast was administered.  COMPARISON:  Lumbar spine radiographs 09/01/2017  FINDINGS: Segmentation:  Normal  Alignment:  Normal  Vertebrae:  Negative for fracture or mass.  Normal bone marrow.  Conus medullaris and cauda equina: Conus extends to the L1-2 level. Conus and cauda equina appear normal.  Paraspinal and other soft tissues: Negative for paraspinous mass or soft tissue swelling  Disc levels:  T12-L1: Negative  L1-2: Mild disc and mild facet degeneration with mild spinal stenosis  L2-3: Moderate disc bulging and mild facet hypertrophy. Moderately severe spinal stenosis. Neural foramina patent bilaterally. Subarticular stenosis left greater than right  L3-4: Diffuse bulging of the disc with endplate spurring. Moderate facet and ligamentum flavum hypertrophy causing severe spinal stenosis. Marked subarticular stenosis bilaterally.  L4-5: Severe spinal stenosis. Diffuse disc bulging and endplate spurring right greater than left. Endplate edema on the right. Moderate facet and ligamentum flavum hypertrophy bilaterally. Marked subarticular stenosis bilaterally.  L5-S1: Large central disc and osteophyte complex with associated disc space narrowing. This appears chronic. There is displacement of the right S1 nerve root which is not compressed. Subarticular stenosis due to spurring bilaterally.  IMPRESSION: Extensive multilevel lumbar degenerative change.  Mild spinal stenosis L2-3  Moderately severe spinal stenosis L2-3 with subarticular stenosis left greater than right  Severe spinal stenosis L3-4 with marked subarticular stenosis bilaterally  Severe spinal stenosis L4-5 with marked subarticular stenosis bilaterally  Large central disc and osteophyte complex with subarticular stenosis bilaterally. Mild displacement of the  right S1 nerve root.   Electronically Signed   By: Franchot Gallo M.D.   On: 09/22/2017 10:44  2-3 view lumbar spine x-ray dated 12/30/2016  AP lateral lumbar x-rays are obtained and reviewed. This shows changes  consistent with ankylosing spondylitis with the lateral and anterior  bridging osteophytes the upper and mid level lumbar region. He has some  curvature less than 20 the lumbar spine. Facet arthropathy is noted.  Impression: Lumbar disc degeneration with ankylosis and significant  endplate spurring laterally and anteriorly at multiple levels     Objective:  VS:  HT:    WT:   BMI:     BP:115/66  HR:61bpm  TEMP:98.3 F (36.8 C)(Oral)  RESP:92 % Physical Exam  Ortho Exam Imaging: No results found.

## 2018-01-08 ENCOUNTER — Other Ambulatory Visit: Payer: Self-pay | Admitting: Internal Medicine

## 2018-01-08 DIAGNOSIS — I25118 Atherosclerotic heart disease of native coronary artery with other forms of angina pectoris: Secondary | ICD-10-CM

## 2018-01-31 ENCOUNTER — Other Ambulatory Visit: Payer: Self-pay | Admitting: Adult Health

## 2018-02-02 NOTE — Progress Notes (Signed)
Subjective:    Patient ID: Jacob Harrison, male    DOB: 1941-03-25, 77 y.o.   MRN: [id number]  HPI:  11/27/16 OV: Mr. Budnick is here for regular f/u: HTN, HL, T2D.  Due to bradycardia his cardiologist d/c'd toprol and started him on carvedilol 6.25mg  BID.  He was instructed to talke his BP and HR daily and if HR consistently <55 to call cards.  Today HR is 48 and he reports home HR 46-mid 50s.  He also has been experiencing dizziness with position changes and increased fatigue.  He has not called Dr. Bernita Buffy to report continued bradycardia. He continues to eat a heart healthy diet and "moves all day whether is be yeard work or exercise at the gym".   He reports medication compliance, denies SE. His last lipid panel looked great and cards continued her current statin therapy. He reports dramatic reduction in angina and has only required 1 nitrostat in the last 6 months.  02/25/17 OV: Mr. Fiallos is here for CPE. Due to bradycardia, cards instructed him to decrease carvedilol to 3.125mg  BID.  Then his BP increased and was consistently above goal and cards bumped carvedilol back to 6 mg BID.  BP improved and HR has remained >50.  Home HR 50-55 and dizziness has completely resolved. Home AM BS 110s, denies episodes of hypoglycemia.  He walks .5-1 mile 3-4 days week. He tries to follow low saturated fat/CHO/sugar diet and continues to abstain from tobacco use.  He enjoys whiskey/water 1-2 day.  Healthcare Maintenance: Colonoscopy- Not due until 2024, IFOBT cards provided today. AAA Screening- he has never smoked, therefore screening is not recommended.  07/30/17 OV: Mr. Bekele presents for f/u: HTN, HLD, T2D Fasting labs obtained today He reports medication compliance, denies SE AM BS 105-120s, denies episodes of hypoglycemia  02/03/18 OV: Mr. Mcclafferty is here for f/u: HTN, T2D, HLD He was seen by cards/July 2019- murmur detected at [facility] 10/21/16 Echo did not show any significant valvular  disease or evidence of obstructive anatomy He reports AM BS 105-120, he denies episodes of hypoglycemia He remains active with yard/house work He has been working with [facility]/Dr. 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 Atypical mole 07/30/2017  . Healthcare maintenance 07/30/2017  . Wheezing 07/21/2017  . Acute bronchitis 07/21/2017  . Neck pain 06/08/2017  . Sinus bradycardia 03/18/2017  . Screening for colon cancer 02/25/2017  . Ventral hernia without obstruction or gangrene 02/25/2017  . (HFpEF) heart failure with preserved ejection fraction ([place]) 09/28/2016  . Ischemic cardiomyopathy 09/28/2016  . Cough 07/22/2016  . Vitamin D deficiency 06/27/2016  . Vitamin B deficiency 06/27/2016  . Hyperlipidemia LDL goal <70 06/21/2016  . Weakness of both lower extremities 06/21/2016  . Diabetes mellitus without complication ([place]) 40/98/1191  . Essential hypertension 05/20/2016  . Hyperlipidemia associated with type 2 diabetes mellitus ([place]) 05/20/2016  . Coronary artery disease 05/20/2016     Past Medical History:  Diagnosis Date  . (  HFpEF) heart failure with preserved ejection fraction ([place])    a. 10/2016 Echo: EF 65-70%, mild LVH, mildly dil LA. RV fxn nl.  Marland Kitchen BPH (benign prostatic hyperplasia)   . Coronary artery disease    a. 2001 CABG x 3 [facility]): LIMA->LAD, VG->LCX, VG->RPDA; b. 04/05/2010 Cath ([facility]): LM 25m, LAD 160m, LCX 40m, RCA 100p, LIMA->LAD  atretic, VG->LCX 100, VG->RCA 80ost, 70d; c. 04/2010 Redo CABG x 2 ([place]): VG->LAD, VG->RPDA; d. 10/2012 Cath [facility]): LM 20-30, LAD 100/95-88m, LCX 40-38m, RCA 100p, 80-100d, RPDA 99, RPL1 95, VG->LAD ok, VG->RPDA ok, EF 67%; e. 11/2016 MV: EF 48%, No ischemia.  . Diabetes mellitus without complication ([place])   . Hyperlipidemia   . Scoliosis   . Spinal stenosis      Past Surgical History:  Procedure Laterality Date  . CORONARY ARTERY BYPASS GRAFT     x 2 (2001 and redo in late 2011 or early 2012)  . HERNIA REPAIR Left    inguinal  . TONSILLECTOMY AND ADENOIDECTOMY    . TRANSURETHRAL RESECTION OF PROSTATE  1990     Family History  Problem Relation Age of Onset  . Diabetes Mother   . Hyperlipidemia Mother   . Cancer Father        colon  . Alcohol abuse Maternal Uncle   . Diabetes Maternal Uncle   . Hyperlipidemia Maternal Uncle   . Cancer Paternal Uncle        colon     Social History   Substance and Sexual Activity  Drug Use No     Social History   Substance and Sexual Activity  Alcohol Use Yes  . Alcohol/week: 11.0 standard drinks  . Types: 5 Glasses of wine, 6 Cans of beer per week     Social History   Tobacco Use  Smoking Status Never Smoker  Smokeless Tobacco Never Used     Outpatient Encounter Medications as of 02/03/2018  Medication Sig  . Albuterol Sulfate (PROAIR RESPICLICK) 096 (90 Base) MCG/ACT AEPB Inhale 2 puffs into the lungs every 6 (six) hours as needed.  Marland Kitchen aspirin EC 81 MG tablet Take 1 tablet (81 mg total) by mouth daily.  . carvedilol (COREG) 6.25 MG tablet Take 1 tablet (6.25 mg total) by mouth 2 (two) times daily.  . Cholecalciferol (VITAMIN D-3 PO) Take 800 Units by mouth daily.  . Coenzyme Q10 (CO Q-10) 200 MG CAPS Take 1 capsule by mouth daily.  . cyanocobalamin 500 MCG tablet Take 500 mcg by mouth daily.  Marland Kitchen ezetimibe (ZETIA) 10 MG tablet TAKE 1 TABLET BY MOUTH ONCE DAILY  . finasteride (PROSCAR) 5 MG tablet TAKE 1 TABLET BY MOUTH  ONCE DAILY  . Glucosamine-Chondroit-Vit C-Mn (GLUCOSAMINE 1500 COMPLEX) CAPS Take 1 capsule by mouth daily.  . metFORMIN (GLUCOPHAGE) 500 MG tablet Take 1 tablet (500 mg total) by mouth daily.  . nitroGLYCERIN (NITROSTAT) 0.4 MG SL tablet Place 1 tablet under the tongue as needed.  . ranolazine (RANEXA) 1000 MG SR tablet TAKE 1  BY MOUTH TWICE DAILY  . rosuvastatin (CRESTOR) 20 MG tablet TAKE 1 TABLET BY MOUTH AT BEDTIME  . Lancets (ONETOUCH ULTRASOFT) lancets Use to check blood sugars fasting daily  . [DISCONTINUED] Multiple Vitamin (MULTIVITAMIN) tablet Take 1 tablet by mouth daily.  . [DISCONTINUED] Multiple Vitamins-Minerals (ICAPS AREDS 2) CAPS Take 2 tablets by mouth daily.   Facility-Administered Encounter Medications as of 02/03/2018  Medication  . ipratropium-albuterol (DUONEB) 0.5-2.5 (3) MG/3ML nebulizer solution 3 mL  Allergies: Tetracyclines & related  Body mass index is 31.67 kg/m.  Blood pressure 116/67, pulse (!) 53, height 5\' 7"  (1.702 m), weight 202 lb 3.2 oz (91.7 kg).  Review of Systems  Constitutional: Positive for fatigue. Negative for activity change, appetite change, chills, diaphoresis, fever and unexpected weight change.  Eyes: Negative for visual disturbance.  Respiratory: Negative for cough, chest tightness, shortness of breath, wheezing and stridor.   Cardiovascular: Negative for chest pain, palpitations and leg swelling.       Today 48 He denies dizziness or increase in fatigue  Gastrointestinal: Negative for abdominal distention, abdominal pain, blood in stool, constipation, diarrhea, nausea and vomiting.  Endocrine: Negative for cold intolerance, heat intolerance, polydipsia, polyphagia and polyuria.  Genitourinary: Negative for difficulty urinating, flank pain and hematuria.  Musculoskeletal: Positive for arthralgias, back pain and myalgias. Negative for gait problem, joint swelling, neck pain and neck stiffness.  Skin: Negative for color change,  pallor, rash and wound.  Neurological: Negative for dizziness, tremors, weakness and light-headedness.  Hematological: Does not bruise/bleed easily.  Psychiatric/Behavioral: Negative for decreased concentration, hallucinations, self-injury, sleep disturbance and suicidal ideas. The patient is not nervous/anxious and is not hyperactive.        Objective:   Physical Exam  Constitutional: He is oriented to person, place, and time. He appears well-developed and well-nourished. No distress.  HENT:  Head: Normocephalic and atraumatic.  Right Ear: External ear normal. No decreased hearing is noted.  Left Ear: External ear normal. No decreased hearing is noted.  Eyes: Pupils are equal, round, and reactive to light. Conjunctivae and EOM are normal.  Neck: Normal range of motion. Neck supple.  Cardiovascular: Regular rhythm and intact distal pulses. Bradycardia present.  Murmur heard. Pulmonary/Chest: Effort normal and breath sounds normal. No respiratory distress. He has no wheezes. He has no rales. He exhibits no tenderness.  Lymphadenopathy:    He has no cervical adenopathy.  Neurological: He is alert and oriented to person, place, and time. Coordination normal.  Skin: Skin is warm and dry. No rash noted. He is not diaphoretic. No erythema. No pallor.  Psychiatric: He has a normal mood and affect. His behavior is normal. Judgment and thought content normal.  Nursing note and vitals reviewed.     Assessment & Plan:   1. Diabetes mellitus without complication ([place])   2. Essential hypertension   3. Healthcare maintenance     Diabetes mellitus without complication ([place]) Lab Results  Component Value Date   HGBA1C 5.5 02/03/2018   HGBA1C 6.1 (H) 07/30/2017   HGBA1C 6.0 07/30/2017   AM BS 105-120 Denies episodes of hypoglycemia Please continue to check your morning blood sugar and if <100, please call clinic. Currently taking Metformin 500mg  QD  Healthcare maintenance A1c- 5.5- GREAT  JOB! Please continue to check your morning blood sugar and if <100, please call clinic. Continue to follow diabetic diet and remain as active as possible. Remain well hydrated. Please continue to check your blood pressure and heart rate and if heart rate falls <50, please call your Cardiologist. Please schedule follow-up in 3 months.  Essential hypertension BP 116/67, HR 53 Advised to call cardiology if HR <60 10/2016 Echo- did not show any significant valvular disease or evidence of obstructive anatomy Followed by cards Q6M    FOLLOW-UP:  Return in about 3 months (around 05/06/2018) for Regular Follow Up, Diabetes.

## 2018-02-03 ENCOUNTER — Encounter: Payer: Self-pay | Admitting: Adult Health

## 2018-02-03 ENCOUNTER — Ambulatory Visit (INDEPENDENT_AMBULATORY_CARE_PROVIDER_SITE_OTHER): Payer: Medicare Other | Admitting: Adult Health

## 2018-02-03 VITALS — BP 116/67 | HR 53 | Ht 67.0 in | Wt 202.2 lb

## 2018-02-03 DIAGNOSIS — I10 Essential (primary) hypertension: Secondary | ICD-10-CM

## 2018-02-03 DIAGNOSIS — E119 Type 2 diabetes mellitus without complications: Secondary | ICD-10-CM | POA: Diagnosis not present

## 2018-02-03 DIAGNOSIS — Z0000 Encounter for general adult medical examination without abnormal findings: Secondary | ICD-10-CM

## 2018-02-03 LAB — POCT GLYCOSYLATED HEMOGLOBIN (HGB A1C): Hemoglobin A1C: 5.5 % (ref 4.0–5.6)

## 2018-02-03 MED ORDER — ONETOUCH ULTRASOFT LANCETS MISC: 12 refills | Status: DC

## 2018-02-03 NOTE — Assessment & Plan Note (Signed)
Lab Results  Component Value Date   HGBA1C 5.5 02/03/2018   HGBA1C 6.1 (H) 07/30/2017   HGBA1C 6.0 07/30/2017   AM BS 105-120 Denies episodes of hypoglycemia Please continue to check your morning blood sugar and if <100, please call clinic. Currently taking Metformin 500mg  QD

## 2018-02-03 NOTE — Assessment & Plan Note (Addendum)
BP 116/67, HR 53 Advised to call cardiology if HR <60 10/2016 Echo- did not show any significant valvular disease or evidence of obstructive anatomy Followed by cards Q6M

## 2018-02-03 NOTE — Assessment & Plan Note (Signed)
A1c- 5.5- GREAT JOB! Please continue to check your morning blood sugar and if <100, please call clinic. Continue to follow diabetic diet and remain as active as possible. Remain well hydrated. Please continue to check your blood pressure and heart rate and if heart rate falls <50, please call your Cardiologist. Please schedule follow-up in 3 months.

## 2018-02-03 NOTE — Patient Instructions (Addendum)
Diabetes Mellitus and Nutrition When you have diabetes (diabetes mellitus), it is very important to have healthy eating habits because your blood sugar (glucose) levels are greatly affected by what you eat and drink. Eating healthy foods in the appropriate amounts, at about the same times every day, can help you:  Control your blood glucose.  Lower your risk of heart disease.  Improve your blood pressure.  Reach or maintain a healthy weight.  Every person with diabetes is different, and each person has different needs for a meal plan. Your health care provider may recommend that you work with a diet and nutrition specialist (dietitian) to make a meal plan that is best for you. Your meal plan may vary depending on factors such as:  The calories you need.  The medicines you take.  Your weight.  Your blood glucose, blood pressure, and cholesterol levels.  Your activity level.  Other health conditions you have, such as heart or kidney disease.  How do carbohydrates affect me? Carbohydrates affect your blood glucose level more than any other type of food. Eating carbohydrates naturally increases the amount of glucose in your blood. Carbohydrate counting is a method for keeping track of how many carbohydrates you eat. Counting carbohydrates is important to keep your blood glucose at a healthy level, especially if you use insulin or take certain oral diabetes medicines. It is important to know how many carbohydrates you can safely have in each meal. This is different for every person. Your dietitian can help you calculate how many carbohydrates you should have at each meal and for snack. Foods that contain carbohydrates include:  Bread, cereal, rice, pasta, and crackers.  Potatoes and corn.  Peas, beans, and lentils.  Milk and yogurt.  Fruit and juice.  Desserts, such as cakes, cookies, ice cream, and candy.  How does alcohol affect me? Alcohol can cause a sudden decrease in blood  glucose (hypoglycemia), especially if you use insulin or take certain oral diabetes medicines. Hypoglycemia can be a life-threatening condition. Symptoms of hypoglycemia (sleepiness, dizziness, and confusion) are similar to symptoms of having too much alcohol. If your health care provider says that alcohol is safe for you, follow these guidelines:  Limit alcohol intake to no more than 1 drink per day for nonpregnant women and 2 drinks per day for men. One drink equals 12 oz of beer, 5 oz of wine, or 1 oz of hard liquor.  Do not drink on an empty stomach.  Keep yourself hydrated with water, diet soda, or unsweetened iced tea.  Keep in mind that regular soda, juice, and other mixers may contain a lot of sugar and must be counted as carbohydrates.  What are tips for following this plan? Reading food labels  Start by checking the serving size on the label. The amount of calories, carbohydrates, fats, and other nutrients listed on the label are based on one serving of the food. Many foods contain more than one serving per package.  Check the total grams (g) of carbohydrates in one serving. You can calculate the number of servings of carbohydrates in one serving by dividing the total carbohydrates by 15. For example, if a food has 30 g of total carbohydrates, it would be equal to 2 servings of carbohydrates.  Check the number of grams (g) of saturated and trans fats in one serving. Choose foods that have low or no amount of these fats.  Check the number of milligrams (mg) of sodium in one serving. Most people   should limit total sodium intake to less than 2,300 mg per day.  Always check the nutrition information of foods labeled as "low-fat" or "nonfat". These foods may be higher in added sugar or refined carbohydrates and should be avoided.  Talk to your dietitian to identify your daily goals for nutrients listed on the label. Shopping  Avoid buying canned, premade, or processed foods. These  foods tend to be high in fat, sodium, and added sugar.  Shop around the outside edge of the grocery store. This includes fresh fruits and vegetables, bulk grains, fresh meats, and fresh dairy. Cooking  Use low-heat cooking methods, such as baking, instead of high-heat cooking methods like deep frying.  Cook using healthy oils, such as olive, canola, or sunflower oil.  Avoid cooking with butter, cream, or high-fat meats. Meal planning  Eat meals and snacks regularly, preferably at the same times every day. Avoid going long periods of time without eating.  Eat foods high in fiber, such as fresh fruits, vegetables, beans, and whole grains. Talk to your dietitian about how many servings of carbohydrates you can eat at each meal.  Eat 4-6 ounces of lean protein each day, such as lean meat, chicken, fish, eggs, or tofu. 1 ounce is equal to 1 ounce of meat, chicken, or fish, 1 egg, or 1/4 cup of tofu.  Eat some foods each day that contain healthy fats, such as avocado, nuts, seeds, and fish. Lifestyle   Check your blood glucose regularly.  Exercise at least 30 minutes 5 or more days each week, or as told by your health care provider.  Take medicines as told by your health care provider.  Do not use any products that contain nicotine or tobacco, such as cigarettes and e-cigarettes. If you need help quitting, ask your health care provider.  Work with a Social worker or diabetes educator to identify strategies to manage stress and any emotional and social challenges. What are some questions to ask my health care provider?  Do I need to meet with a diabetes educator?  Do I need to meet with a dietitian?  What number can I call if I have questions?  When are the best times to check my blood glucose? Where to find more information:  American Diabetes Association: diabetes.org/food-and-fitness/food  Academy of Nutrition and Dietetics:  PokerClues.dk  [employer] of Diabetes and Digestive and Kidney Diseases (NIH): ContactWire.be Summary  A healthy meal plan will help you control your blood glucose and maintain a healthy lifestyle.  Working with a diet and nutrition specialist (dietitian) can help you make a meal plan that is best for you.  Keep in mind that carbohydrates and alcohol have immediate effects on your blood glucose levels. It is important to count carbohydrates and to use alcohol carefully. This information is not intended to replace advice given to you by your health care provider. Make sure you discuss any questions you have with your health care provider. Document Released: 12/19/2004 Document Revised: 04/28/2016 Document Reviewed: 04/28/2016 Elsevier Interactive Patient Education  2018 [place].  A1c- 5.5- GREAT JOB! Please continue to check your morning blood sugar and if <100, please call clinic. Continue to follow diabetic diet and remain as active as possible. Remain well hydrated. Please continue to check your blood pressure and heart rate and if heart rate falls <60, please call your Cardiologist. Please schedule follow-up in 3 months. HAPPY EARLY BIRTHDAY ENJOY YOUR HUNTING TRIP! NICE TO SEE YOU!

## 2018-03-16 ENCOUNTER — Telehealth: Payer: Self-pay | Admitting: Adult Health

## 2018-03-16 NOTE — Telephone Encounter (Signed)
Unable to obtain last eye exam information from [facility] ophthalmology (called pt who states did not have eye exam 2019/ R/S for Jan 2020. --forwarding message to medical assistant.  -glh

## 2018-03-16 NOTE — Telephone Encounter (Signed)
Noted.  T. Nelson, CMA 

## 2018-03-17 ENCOUNTER — Other Ambulatory Visit: Payer: Self-pay | Admitting: Adult Health

## 2018-04-07 HISTORY — PX: BACK SURGERY: SHX140

## 2018-04-09 LAB — HM DIABETES EYE EXAM

## 2018-04-21 ENCOUNTER — Telehealth (INDEPENDENT_AMBULATORY_CARE_PROVIDER_SITE_OTHER): Payer: Self-pay | Admitting: Physical Medicine and Rehabilitation

## 2018-04-21 NOTE — Telephone Encounter (Signed)
Ok if helped, try to get % and time of relief (roughly)

## 2018-04-22 ENCOUNTER — Telehealth (INDEPENDENT_AMBULATORY_CARE_PROVIDER_SITE_OTHER): Payer: Self-pay | Admitting: Physical Medicine and Rehabilitation

## 2018-04-22 NOTE — Telephone Encounter (Signed)
Patient reports that he got about 70% relief from last injection, and the relief lasted until very recently. The pain is still not back to the level it was prior to the injection, but the numbness has returned. Scheduled for 05/11/18 at 1330.

## 2018-04-22 NOTE — Telephone Encounter (Signed)
Notification or Prior Authorization is not required for the requested services  This [employer]vant[employer] members plan does not currently require a prior authorization for [id number]  Decision ID #:[id number]

## 2018-04-26 NOTE — Progress Notes (Signed)
Subjective:    Patient ID: Jacob Harrison, male    DOB: June 16, 1940, 78 y.o.   MRN: [id number]  HPI:  Jacob Harrison presents with non-productive cough, clear nasal drainage, and copious yellow/clear watery drainage from eyes.  He reports that his eyes will be crusted shut in the morning from the drainage.  He denies change in vision. He wears only corrective lenses.  He reports cough/nasal drainage started 4 days ago He has been using OTC Zyrtec, saline nasal spray, and "some sort of cough remedy" He denies CP/HA/dizziness/palpitations/fever/night sweats/N/V/D He denies being exposed to anyone with known flu He continues to abstain from tobacco/vape use  Patient Care Team    Relationship Specialty Notifications Start End  Esaw Grandchild, NP PCP - General Family Medicine  05/20/16   End, Harrell Gave, MD PCP - Cardiology Cardiology Admissions 10/28/17     Patient Active Problem List   Diagnosis Date Noted  . Bacterial conjunctivitis of both eyes 04/27/2018  . Atypical mole 07/30/2017  . Healthcare maintenance 07/30/2017  . Wheezing 07/21/2017  . Acute bronchitis 07/21/2017  . Neck pain 06/08/2017  . Sinus bradycardia 03/18/2017  . Screening for colon cancer 02/25/2017  . Ventral hernia without obstruction or gangrene 02/25/2017  . (HFpEF) heart failure with preserved ejection fraction ([place]) 09/28/2016  . Ischemic cardiomyopathy 09/28/2016  . Cough in adult 07/22/2016  . Vitamin D deficiency 06/27/2016  . Vitamin B deficiency 06/27/2016  . Hyperlipidemia LDL goal <70 06/21/2016  . Weakness of both lower extremities 06/21/2016  . Diabetes mellitus without complication ([place]) 78/29/5621  . Essential hypertension 05/20/2016  . Hyperlipidemia associated with type 2 diabetes mellitus ([place]) 05/20/2016  . Coronary artery disease 05/20/2016     Past Medical History:  Diagnosis Date  . (HFpEF) heart failure with preserved ejection fraction ([place])    a. 10/2016 Echo: EF 65-70%, mild LVH,  mildly dil LA. RV fxn nl.  Marland Kitchen BPH (benign prostatic hyperplasia)   . Coronary artery disease    a. 2001 CABG x 3 [facility]): LIMA->LAD, VG->LCX, VG->RPDA; b. 04/05/2010 Cath ([place]): LM 63m, LAD 132m, LCX 52m, RCA 100p, LIMA->LAD atretic, VG->LCX 100, VG->RCA 80ost, 70d; c. 04/2010 Redo CABG x 2 ([place]): VG->LAD, VG->RPDA; d. 10/2012 Cath [facility]): LM 20-30, LAD 100/95-55m, LCX 40-63m, RCA 100p, 80-100d, RPDA 99, RPL1 95, VG->LAD ok, VG->RPDA ok, EF 67%; e. 11/2016 MV: EF 48%, No ischemia.  . Diabetes mellitus without complication ([place])   . Hyperlipidemia   . Scoliosis   . Spinal stenosis      Past Surgical History:  Procedure Laterality Date  . CORONARY ARTERY BYPASS GRAFT     x 2 (2001 and redo in late 2011 or early 2012)  . HERNIA REPAIR Left    inguinal  . TONSILLECTOMY AND ADENOIDECTOMY    . TRANSURETHRAL RESECTION OF PROSTATE  1990     Family History  Problem Relation Age of Onset  . Diabetes Mother   . Hyperlipidemia Mother   . Cancer Father        colon  . Alcohol abuse Maternal Uncle   . Diabetes Maternal Uncle   . Hyperlipidemia Maternal Uncle   . Cancer Paternal Uncle        colon     Social History   Substance and Sexual Activity  Drug Use No     Social History   Substance and Sexual Activity  Alcohol Use Yes  . Alcohol/week: 11.0 standard drinks  . Types: 5 Glasses of wine, 6 Cans  of beer per week     Social History   Tobacco Use  Smoking Status Never Smoker  Smokeless Tobacco Never Used     Outpatient Encounter Medications as of 04/27/2018  Medication Sig  . Albuterol Sulfate (PROAIR RESPICLICK) 831 (90 Base) MCG/ACT AEPB Inhale 2 puffs into the lungs every 6 (six) hours as needed.  Marland Kitchen aspirin EC 81 MG tablet Take 1 tablet (81 mg total) by mouth daily.  . carvedilol (COREG) 6.25 MG tablet Take 1 tablet (6.25 mg total) by mouth 2 (two) times daily.  . Cholecalciferol (VITAMIN D-3 PO) Take 800 Units by mouth daily.  . Coenzyme Q10 (CO Q-10)  200 MG CAPS Take 1 capsule by mouth daily.  . cyanocobalamin 500 MCG tablet Take 500 mcg by mouth daily.  Marland Kitchen ezetimibe (ZETIA) 10 MG tablet TAKE 1 TABLET BY MOUTH ONCE DAILY  . finasteride (PROSCAR) 5 MG tablet TAKE 1 TABLET BY MOUTH ONCE DAILY  . Glucosamine-Chondroit-Vit C-Mn (GLUCOSAMINE 1500 COMPLEX) CAPS Take 1 capsule by mouth daily.  . Lancets (ONETOUCH ULTRASOFT) lancets Use to check blood sugars fasting daily  . metFORMIN (GLUCOPHAGE) 500 MG tablet Take 1 tablet (500 mg total) by mouth daily.  . nitroGLYCERIN (NITROSTAT) 0.4 MG SL tablet Place 1 tablet under the tongue as needed.  . ranolazine (RANEXA) 1000 MG SR tablet TAKE 1  BY MOUTH TWICE DAILY  . rosuvastatin (CRESTOR) 20 MG tablet TAKE 1 TABLET BY MOUTH AT BEDTIME  . benzonatate (TESSALON) 200 MG capsule Take 1 capsule (200 mg total) by mouth 2 (two) times daily as needed for cough.  . ciprofloxacin (CILOXAN) 0.3 % ophthalmic solution Place 2 drops into both eyes every 2 (two) hours. Administer 1 drop, every 2 hours, while awake, for 2 days. Then 1 drop, every 4 hours, while awake, for the next 5 days.  . fluticasone (FLONASE) 50 MCG/ACT nasal spray Place 2 sprays into both nostrils daily.   Facility-Administered Encounter Medications as of 04/27/2018  Medication  . ipratropium-albuterol (DUONEB) 0.5-2.5 (3) MG/3ML nebulizer solution 3 mL    Allergies: Tetracyclines & related  Body mass index is 32.39 kg/m.  Blood pressure 110/67, pulse (!) 52, temperature 98.7 F (37.1 C), temperature source Oral, height 5\' 7"  (1.702 m), weight 206 lb 12.8 oz (93.8 kg), SpO2 96 %.  Review of Systems  Constitutional: Positive for fatigue. Negative for activity change, appetite change, chills, diaphoresis, fever and unexpected weight change.  HENT: Positive for congestion, postnasal drip, rhinorrhea and sinus pressure. Negative for ear discharge, facial swelling, sinus pain, sore throat, trouble swallowing and voice change.   Eyes: Positive  for discharge, redness and itching. Negative for photophobia, pain and visual disturbance.  Respiratory: Positive for cough. Negative for chest tightness, shortness of breath, wheezing and stridor.   Cardiovascular: Negative for chest pain, palpitations and leg swelling.  Gastrointestinal: Negative for abdominal distention, abdominal pain, blood in stool, diarrhea, nausea and vomiting.  Genitourinary: Negative for difficulty urinating and flank pain.  Neurological: Negative for dizziness and headaches.  Hematological: Does not bruise/bleed easily.  Psychiatric/Behavioral: Positive for sleep disturbance.       Objective:   Physical Exam Constitutional:      General: He is not in acute distress.    Appearance: He is not ill-appearing, toxic-appearing or diaphoretic.  HENT:     Head: Normocephalic and atraumatic.     Right Ear: Tympanic membrane, ear canal and external ear normal.     Left Ear: Tympanic membrane, ear canal and  external ear normal. There is no impacted cerumen.     Nose: Congestion and rhinorrhea present.     Mouth/Throat:     Pharynx: Posterior oropharyngeal erythema present. No oropharyngeal exudate.  Eyes:     Extraocular Movements: Extraocular movements intact.     Right eye: Normal extraocular motion and no nystagmus.     Left eye: Normal extraocular motion and no nystagmus.     Conjunctiva/sclera:     Right eye: Right conjunctiva is injected. No exudate.    Left eye: Left conjunctiva is injected. No exudate.    Pupils: Pupils are equal, round, and reactive to light.     Comments: Profuse watery drainage noted from eyes bilaterally  Conjunctiva bilaterally reddened   Neck:     Musculoskeletal: Normal range of motion.  Cardiovascular:     Rate and Rhythm: Bradycardia present.     Pulses: Normal pulses.     Heart sounds: Normal heart sounds. No murmur. No friction rub. No gallop.   Pulmonary:     Effort: Pulmonary effort is normal. No respiratory distress.      Breath sounds: No stridor. No wheezing, rhonchi or rales.  Chest:     Chest wall: No tenderness.  Skin:    Capillary Refill: Capillary refill takes less than 2 seconds.  Neurological:     Mental Status: He is alert and oriented to person, place, and time.  Psychiatric:        Mood and Affect: Mood normal.        Behavior: Behavior normal.        Thought Content: Thought content normal.        Judgment: Judgment normal.        Assessment & Plan:   1. Cough in adult   2. Bacterial conjunctivitis of both eyes     Cough in adult Only 4 days of sx's, ABX not indicated Please use Flonase ans Tessalon as needed. Increase fluids and rest. If cough persists into next week, please call clinic and we will send in Azithromycin.  Bacterial conjunctivitis of both eyes Cipro eye gtt's as directed    FOLLOW-UP:  Return if symptoms worsen or fail to improve.

## 2018-04-27 ENCOUNTER — Encounter: Payer: Self-pay | Admitting: Adult Health

## 2018-04-27 ENCOUNTER — Ambulatory Visit (INDEPENDENT_AMBULATORY_CARE_PROVIDER_SITE_OTHER): Payer: Medicare Other | Admitting: Adult Health

## 2018-04-27 VITALS — BP 110/67 | HR 52 | Temp 98.7°F | Ht 67.0 in | Wt 206.8 lb

## 2018-04-27 DIAGNOSIS — H109 Unspecified conjunctivitis: Secondary | ICD-10-CM | POA: Diagnosis not present

## 2018-04-27 DIAGNOSIS — R059 Cough, unspecified: Secondary | ICD-10-CM

## 2018-04-27 DIAGNOSIS — R05 Cough: Secondary | ICD-10-CM | POA: Diagnosis not present

## 2018-04-27 DIAGNOSIS — B9689 Other specified bacterial agents as the cause of diseases classified elsewhere: Secondary | ICD-10-CM

## 2018-04-27 MED ORDER — CIPROFLOXACIN HCL 0.3 % OP SOLN: 2.0000 [drp] | OPHTHALMIC | 0 refills | Status: DC

## 2018-04-27 MED ORDER — FLUTICASONE PROPIONATE 50 MCG/ACT NA SUSP: 2.0000 | Freq: Every day | NASAL | 2 refills | Status: DC

## 2018-04-27 MED ORDER — BENZONATATE 200 MG PO CAPS: 200.0000 mg | ORAL_CAPSULE | Freq: Two times a day (BID) | ORAL | 0 refills | Status: DC | PRN

## 2018-04-27 NOTE — Assessment & Plan Note (Addendum)
Only 4 days of sx's, ABX not indicated Please use Flonase ans Tessalon as needed. Increase fluids and rest. If cough persists into next week, please call clinic and we will send in Azithromycin.

## 2018-04-27 NOTE — Assessment & Plan Note (Signed)
Cipro eye gtt's as directed

## 2018-04-27 NOTE — Patient Instructions (Addendum)
Cough, Adult  Coughing is a reflex that clears your throat and your airways. Coughing helps to heal and protect your lungs. It is normal to cough occasionally, but a cough that happens with other symptoms or lasts a long time may be a sign of a condition that needs treatment. A cough may last only 2-3 weeks (acute), or it may last longer than 8 weeks (chronic). What are the causes? Coughing is commonly caused by:  Breathing in substances that irritate your lungs.  A viral or bacterial respiratory infection.  Allergies.  Asthma.  Postnasal drip.  Smoking.  Acid backing up from the stomach into the esophagus (gastroesophageal reflux).  Certain medicines.  Chronic lung problems, including COPD (or rarely, lung cancer).  Other medical conditions such as heart failure. Follow these instructions at home: Pay attention to any changes in your symptoms. Take these actions to help with your discomfort:  Take medicines only as told by your health care provider. ? If you were prescribed an antibiotic medicine, take it as told by your health care provider. Do not stop taking the antibiotic even if you start to feel better. ? Talk with your health care provider before you take a cough suppressant medicine.  Drink enough fluid to keep your urine clear or pale yellow.  If the air is dry, use a cold steam vaporizer or humidifier in your bedroom or your home to help loosen secretions.  Avoid anything that causes you to cough at work or at home.  If your cough is worse at night, try sleeping in a semi-upright position.  Avoid cigarette smoke. If you smoke, quit smoking. If you need help quitting, ask your health care provider.  Avoid caffeine.  Avoid alcohol.  Rest as needed. Contact a health care provider if:  You have new symptoms.  You cough up pus.  Your cough does not get better after 2-3 weeks, or your cough gets worse.  You cannot control your cough with suppressant  medicines and you are losing sleep.  You develop pain that is getting worse or pain that is not controlled with pain medicines.  You have a fever.  You have unexplained weight loss.  You have night sweats. Get help right away if:  You cough up blood.  You have difficulty breathing.  Your heartbeat is very fast. This information is not intended to replace advice given to you by your health care provider. Make sure you discuss any questions you have with your health care provider. Document Released: 09/20/2010 Document Revised: 08/30/2015 Document Reviewed: 05/31/2014 Elsevier Interactive Patient Education  2019 [employer]   Bacterial Conjunctivitis, Adult Bacterial conjunctivitis is an infection of your conjunctiva. This is the clear membrane that covers the white part of your eye and the inner part of your eyelid. This infection can make your eye:  Red or pink.  Itchy. This condition spreads easily from person to person (is contagious) and from one eye to the other eye. What are the causes?  This condition is caused by germs (bacteria). You may get the infection if you come into close contact with: ? A person who has the infection. ? Items that have germs on them (are contaminated), such as face towels, contact lens solution, or eye makeup. What increases the risk? You are more likely to get this condition if you:  Have contact with people who have the infection.  Wear contact lenses.  Have a sinus infection.  Have had a recent eye injury or  surgery.  Have a weak body defense system (immune system).  Have dry eyes. What are the signs or symptoms?   Thick, yellowish discharge from the eye.  Tearing or watery eyes.  Itchy eyes.  Burning feeling in your eyes.  Eye redness.  Swollen eyelids.  Blurred vision. How is this treated?   Antibiotic eye drops or ointment.  Antibiotic medicine taken by mouth. This is used for infections that do not get  better with drops or ointment or that last more than 10 days.  Cool, wet cloths placed on the eyes.  Artificial tears used 2-6 times a day. Follow these instructions at home: Medicines  Take or apply your antibiotic medicine as told by your doctor. Do not stop taking or applying the antibiotic even if you start to feel better.  Take or apply over-the-counter and prescription medicines only as told by your doctor.  Do not touch your eyelid with the eye-drop bottle or the ointment tube. Managing discomfort  Wipe any fluid from your eye with a warm, wet washcloth or a cotton ball.  Place a clean, cool, wet cloth on your eye. Do this for 10-20 minutes, 3-4 times per day. General instructions  Do not wear contacts until the infection is gone. Wear glasses until your doctor says it is okay to wear contacts again.  Do not wear eye makeup until the infection is gone. Throw away old eye makeup.  Change or wash your pillowcase every day.  Do not share towels or washcloths.  Wash your hands often with soap and water. Use paper towels to dry your hands.  Do not touch or rub your eyes.  Do not drive or use heavy machinery if your vision is blurred. Contact a doctor if:  You have a fever.  You do not get better after 10 days. Get help right away if:  You have a fever and your symptoms get worse all of a sudden.  You have very bad pain when you move your eye.  Your face: ? Hurts. ? Is red. ? Is swollen.  You have sudden loss of vision. Summary  Bacterial conjunctivitis is an infection of your conjunctiva.  This infection spreads easily from person to person.  Wash your hands often with soap and water. Use paper towels to dry your hands.  Take or apply your antibiotic medicine as told by your doctor.  Contact a doctor if you have a fever or you do not get better after 10 days. This information is not intended to replace advice given to you by your health care provider.  Make sure you discuss any questions you have with your health care provider. Document Released: 01/01/2008 Document Revised: 10/28/2017 Document Reviewed: 10/28/2017 Elsevier Interactive Patient Education  2019 [employer].  Please use eye drops as directed. Please use Flonase ans Tessalon as needed. Increase fluids and rest. If cough persists into next week, please call clinic and we will send in Azithromycin. FEEL BETTER!

## 2018-04-29 NOTE — Progress Notes (Signed)
Subjective:    Patient ID: Jacob Harrison, male    DOB: May 17, 1940, 78 y.o.   MRN: [id number]  HPI: 05/05/2018 OV: Ms. Buice presents with clear nasal drainage, constant non-productive cough, fatigue, fever (highest 100.37f), nausea without vomiting, and loose stools that last 2 days. He reports the cough and nasal drainage have have been present > week, he was seen 04/27/2018 for similar sx's - given flonase and tessalon. He was also provided Cipro eye gtt's for bacterial conjuctivitis. He denies CP/dyspnea at rest/HA/dizziness/palpitations He has been using OTC Cough syrup, OTC Zyrtec, and OTC Aleve- last dose 2 days ago, current temp 98.54f oral He reports AM BS 100-120s, denies episodes of hypoglycemia He denies known exposure to flu  Patient Care Team    Relationship Specialty Notifications Start End  Esaw Grandchild, NP PCP - General Family Medicine  05/20/16   Nelva Bush, MD PCP - Cardiology Cardiology Admissions 10/28/17     Patient Active Problem List   Diagnosis Date Noted  . Flu-like symptoms 05/05/2018  . Bacterial conjunctivitis of both eyes 04/27/2018  . Atypical mole 07/30/2017  . Healthcare maintenance 07/30/2017  . Wheezing 07/21/2017  . Acute bronchitis 07/21/2017  . Neck pain 06/08/2017  . Sinus bradycardia 03/18/2017  . Screening for colon cancer 02/25/2017  . Ventral hernia without obstruction or gangrene 02/25/2017  . (HFpEF) heart failure with preserved ejection fraction ([place]) 09/28/2016  . Ischemic cardiomyopathy 09/28/2016  . Cough in adult 07/22/2016  . Vitamin D deficiency 06/27/2016  . Vitamin B deficiency 06/27/2016  . Hyperlipidemia LDL goal <70 06/21/2016  . Weakness of both lower extremities 06/21/2016  . Diabetes mellitus without complication ([place]) 00/17/4944  . Essential hypertension 05/20/2016  . Hyperlipidemia associated with type 2 diabetes mellitus ([place]) 05/20/2016  . Coronary artery disease 05/20/2016     Past Medical History:   Diagnosis Date  . (HFpEF) heart failure with preserved ejection fraction ([place])    a. 10/2016 Echo: EF 65-70%, mild LVH, mildly dil LA. RV fxn nl.  Marland Kitchen BPH (benign prostatic hyperplasia)   . Coronary artery disease    a. 2001 CABG x 3 [facility]): LIMA->LAD, VG->LCX, VG->RPDA; b. 04/05/2010 Cath ([place]): LM 50m, LAD 187m, LCX 44m, RCA 100p, LIMA->LAD atretic, VG->LCX 100, VG->RCA 80ost, 70d; c. 04/2010 Redo CABG x 2 ([place]): VG->LAD, VG->RPDA; d. 10/2012 Cath [facility]): LM 20-30, LAD 100/95-34m, LCX 40-66m, RCA 100p, 80-100d, RPDA 99, RPL1 95, VG->LAD ok, VG->RPDA ok, EF 67%; e. 11/2016 MV: EF 48%, No ischemia.  . Diabetes mellitus without complication ([place])   . Hyperlipidemia   . Scoliosis   . Spinal stenosis      Past Surgical History:  Procedure Laterality Date  . CORONARY ARTERY BYPASS GRAFT     x 2 (2001 and redo in late 2011 or early 2012)  . HERNIA REPAIR Left    inguinal  . TONSILLECTOMY AND ADENOIDECTOMY    . TRANSURETHRAL RESECTION OF PROSTATE  1990     Family History  Problem Relation Age of Onset  . Diabetes Mother   . Hyperlipidemia Mother   . Cancer Father        colon  . Alcohol abuse Maternal Uncle   . Diabetes Maternal Uncle   . Hyperlipidemia Maternal Uncle   . Cancer Paternal Uncle        colon     Social History   Substance and Sexual Activity  Drug Use No     Social History   Substance and Sexual Activity  Alcohol Use Yes  . Alcohol/week: 11.0 standard drinks  . Types: 5 Glasses of wine, 6 Cans of beer per week     Social History   Tobacco Use  Smoking Status Never Smoker  Smokeless Tobacco Never Used     Outpatient Encounter Medications as of 05/05/2018  Medication Sig  . Albuterol Sulfate (PROAIR RESPICLICK) 154 (90 Base) MCG/ACT AEPB Inhale 2 puffs into the lungs every 6 (six) hours as needed.  Marland Kitchen aspirin EC 81 MG tablet Take 1 tablet (81 mg total) by mouth daily.  . carvedilol (COREG) 6.25 MG tablet Take 1 tablet (6.25 mg total) by  mouth 2 (two) times daily.  . Cholecalciferol (VITAMIN D-3 PO) Take 800 Units by mouth daily.  . ciprofloxacin (CILOXAN) 0.3 % ophthalmic solution Place 2 drops into both eyes every 2 (two) hours. Administer 1 drop, every 2 hours, while awake, for 2 days. Then 1 drop, every 4 hours, while awake, for the next 5 days.  . Coenzyme Q10 (CO Q-10) 200 MG CAPS Take 1 capsule by mouth daily.  . cyanocobalamin 500 MCG tablet Take 500 mcg by mouth daily.  Marland Kitchen ezetimibe (ZETIA) 10 MG tablet TAKE 1 TABLET BY MOUTH ONCE DAILY  . finasteride (PROSCAR) 5 MG tablet TAKE 1 TABLET BY MOUTH ONCE DAILY  . fluticasone (FLONASE) 50 MCG/ACT nasal spray Place 2 sprays into both nostrils daily.  . Glucosamine-Chondroit-Vit C-Mn (GLUCOSAMINE 1500 COMPLEX) CAPS Take 1 capsule by mouth daily.  . Lancets (ONETOUCH ULTRASOFT) lancets Use to check blood sugars fasting daily  . metFORMIN (GLUCOPHAGE) 500 MG tablet Take 1 tablet (500 mg total) by mouth daily.  . nitroGLYCERIN (NITROSTAT) 0.4 MG SL tablet Place 1 tablet under the tongue as needed.  . ranolazine (RANEXA) 1000 MG SR tablet TAKE 1  BY MOUTH TWICE DAILY  . rosuvastatin (CRESTOR) 20 MG tablet TAKE 1 TABLET BY MOUTH AT BEDTIME  . [DISCONTINUED] ciprofloxacin (CILOXAN) 0.3 % ophthalmic solution Place 2 drops into both eyes every 2 (two) hours. Administer 1 drop, every 2 hours, while awake, for 2 days. Then 1 drop, every 4 hours, while awake, for the next 5 days.  Marland Kitchen azithromycin (ZITHROMAX) 250 MG tablet 2 tabs day one. 1 tab days two-five  . benzonatate (TESSALON) 200 MG capsule Take 1 capsule (200 mg total) by mouth 2 (two) times daily as needed for cough.  . [DISCONTINUED] benzonatate (TESSALON) 200 MG capsule Take 1 capsule (200 mg total) by mouth 2 (two) times daily as needed for cough.   Facility-Administered Encounter Medications as of 05/05/2018  Medication  . ipratropium-albuterol (DUONEB) 0.5-2.5 (3) MG/3ML nebulizer solution 3 mL    Allergies: Tetracyclines  & related  Body mass index is 31.98 kg/m.  Blood pressure 110/70, pulse 73, temperature 98.8 F (37.1 C), temperature source Oral, height 5\' 7"  (1.702 m), weight 204 lb 3.2 oz (92.6 kg), SpO2 96 %.  Review of Systems  Constitutional: Positive for fatigue. Negative for activity change, appetite change, chills, diaphoresis, fever and unexpected weight change.  HENT: Positive for congestion, facial swelling, postnasal drip, rhinorrhea, sinus pressure, sinus pain and sneezing.   Eyes: Negative for visual disturbance.  Respiratory: Positive for cough. Negative for chest tightness, shortness of breath, wheezing and stridor.   Cardiovascular: Negative for chest pain, palpitations and leg swelling.  Gastrointestinal: Positive for diarrhea. Negative for abdominal distention, abdominal pain, blood in stool, constipation, nausea and vomiting.  Endocrine: Negative for cold intolerance, heat intolerance, polydipsia, polyphagia and polyuria.  Genitourinary: Negative  for difficulty urinating, flank pain and hematuria.  Musculoskeletal: Positive for arthralgias, back pain and myalgias. Negative for gait problem, joint swelling, neck pain and neck stiffness.  Skin: Negative for color change, pallor, rash and wound.  Neurological: Negative for dizziness, tremors, weakness and light-headedness.  Hematological: Does not bruise/bleed easily.  Psychiatric/Behavioral: Negative for decreased concentration, hallucinations, self-injury, sleep disturbance and suicidal ideas. The patient is not nervous/anxious and is not hyperactive.        Objective:   Physical Exam  Constitutional: He is oriented to person, place, and time. He appears well-developed and well-nourished. No distress.  HENT:  Head: Normocephalic and atraumatic.  Right Ear: External ear normal. Tympanic membrane is bulging. Tympanic membrane is not erythematous. No decreased hearing is noted.  Left Ear: External ear normal. Tympanic membrane is  bulging. Tympanic membrane is not erythematous. No decreased hearing is noted.  Nose: Mucosal edema and rhinorrhea present. Right sinus exhibits no maxillary sinus tenderness and no frontal sinus tenderness. Left sinus exhibits no maxillary sinus tenderness and no frontal sinus tenderness.  Mouth/Throat: Posterior oropharyngeal edema and posterior oropharyngeal erythema present. No oropharyngeal exudate or tonsillar abscesses.  Eyes: Pupils are equal, round, and reactive to light. Conjunctivae and EOM are normal.  Neck: Normal range of motion. Neck supple.  Cardiovascular: Normal rate, regular rhythm and intact distal pulses.  Murmur heard. Pulmonary/Chest: Effort normal and breath sounds normal. No respiratory distress. He has no wheezes. He has no rales. He exhibits no tenderness.  Abdominal: Soft. Bowel sounds are normal. He exhibits no distension and no mass. There is no abdominal tenderness. There is no rebound and no guarding.  Lymphadenopathy:    He has no cervical adenopathy.  Neurological: He is alert and oriented to person, place, and time. Coordination normal.  Skin: Skin is warm and dry. No rash noted. He is not diaphoretic. No erythema. No pallor.  Psychiatric: He has a normal mood and affect. His behavior is normal. Judgment and thought content normal.  Nursing note and vitals reviewed.     Assessment & Plan:   1. Flu-like symptoms     Flu-like symptoms Flu test Negative Please take Azithromycin as directed. Please take Nino Parsley as needed. Use Cipro eye drops as directed. Remain well hydrated and rest often. Alternate OTC Acetaminophen and Ibuprofen as needed fever/discomfort. Please re-schedule regular medical follow-up appt. If symptoms persist after antibiotic completed, please call clinic.    FOLLOW-UP:  Return if symptoms worsen or fail to improve.

## 2018-04-30 ENCOUNTER — Telehealth: Payer: Self-pay | Admitting: Adult Health

## 2018-04-30 NOTE — Telephone Encounter (Signed)
The patient called stating that he we was seen earlier in the week for an acute OV and the med to help with the cough helps during the day but at night he can't get much relief and is up constantly up throughout the night. He is hoping Jacob Harrison could send something else in to help with this so he can finally get some rest. Please advise

## 2018-04-30 NOTE — Telephone Encounter (Signed)
Please advise.  T. Nelson, CMA 

## 2018-04-30 NOTE — Telephone Encounter (Signed)
Called patient and advised that per Dr. Raliegh Scarlet he should try to elevate his head when sleeping or sleep in a recliner if possible.  Patient also advise to try the chloraseptic spray to sooth the throat before going to bed.  Patient will try doing this and will follow up Monday if needed. MPulliam, CMA/RT(R)

## 2018-05-05 ENCOUNTER — Ambulatory Visit (INDEPENDENT_AMBULATORY_CARE_PROVIDER_SITE_OTHER): Payer: Medicare Other | Admitting: Adult Health

## 2018-05-05 ENCOUNTER — Encounter: Payer: Self-pay | Admitting: Adult Health

## 2018-05-05 ENCOUNTER — Telehealth: Payer: Self-pay

## 2018-05-05 VITALS — BP 110/70 | HR 73 | Temp 98.8°F | Ht 67.0 in | Wt 204.2 lb

## 2018-05-05 DIAGNOSIS — R6889 Other general symptoms and signs: Secondary | ICD-10-CM | POA: Diagnosis not present

## 2018-05-05 LAB — POCT INFLUENZA A/B
Influenza A, POC: NEGATIVE
Influenza B, POC: NEGATIVE

## 2018-05-05 MED ORDER — AZITHROMYCIN 250 MG PO TABS: ORAL_TABLET | ORAL | 0 refills | Status: DC

## 2018-05-05 MED ORDER — BENZONATATE 200 MG PO CAPS: 200.0000 mg | ORAL_CAPSULE | Freq: Two times a day (BID) | ORAL | 0 refills | Status: DC | PRN

## 2018-05-05 MED ORDER — CIPROFLOXACIN HCL 0.3 % OP SOLN: 2.0000 [drp] | OPHTHALMIC | 0 refills | Status: DC

## 2018-05-05 NOTE — Patient Instructions (Addendum)
Acute Bronchitis, Adult  Acute bronchitis is sudden (acute) swelling of the air tubes (bronchi) in the lungs. Acute bronchitis causes these tubes to fill with mucus, which can make it hard to breathe. It can also cause coughing or wheezing. In adults, acute bronchitis usually goes away within 2 weeks. A cough caused by bronchitis may last up to 3 weeks. Smoking, allergies, and asthma can make the condition worse. Repeated episodes of bronchitis may cause further lung problems, such as chronic obstructive pulmonary disease (COPD). What are the causes? This condition can be caused by germs and by substances that irritate the lungs, including:  Cold and flu viruses. This condition is most often caused by the same virus that causes a cold.  Bacteria.  Exposure to tobacco smoke, dust, fumes, and air pollution. What increases the risk? This condition is more likely to develop in people who:  Have close contact with someone with acute bronchitis.  Are exposed to lung irritants, such as tobacco smoke, dust, fumes, and vapors.  Have a weak immune system.  Have a respiratory condition such as asthma. What are the signs or symptoms? Symptoms of this condition include:  A cough.  Coughing up clear, yellow, or green mucus.  Wheezing.  Chest congestion.  Shortness of breath.  A fever.  Body aches.  Chills.  A sore throat. How is this diagnosed? This condition is usually diagnosed with a physical exam. During the exam, your health care provider may order tests, such as chest X-rays, to rule out other conditions. He or she may also:  Test a sample of your mucus for bacterial infection.  Check the level of oxygen in your blood. This is done to check for pneumonia.  Do a chest X-ray or lung function testing to rule out pneumonia and other conditions.  Perform blood tests. Your health care provider will also ask about your symptoms and medical history. How is this treated? Most  cases of acute bronchitis clear up over time without treatment. Your health care provider may recommend:  Drinking more fluids. Drinking more makes your mucus thinner, which may make it easier to breathe.  Taking a medicine for a fever or cough.  Taking an antibiotic medicine.  Using an inhaler to help improve shortness of breath and to control a cough.  Using a cool mist vaporizer or humidifier to make it easier to breathe. Follow these instructions at home: Medicines  Take over-the-counter and prescription medicines only as told by your health care provider.  If you were prescribed an antibiotic, take it as told by your health care provider. Do not stop taking the antibiotic even if you start to feel better. General instructions   Get plenty of rest.  Drink enough fluids to keep your urine pale yellow.  Avoid smoking and secondhand smoke. Exposure to cigarette smoke or irritating chemicals will make bronchitis worse. If you smoke and you need help quitting, ask your health care provider. Quitting smoking will help your lungs heal faster.  Use an inhaler, cool mist vaporizer, or humidifier as told by your health care provider.  Keep all follow-up visits as told by your health care provider. This is important. How is this prevented? To lower your risk of getting this condition again:  Wash your hands often with soap and water. If soap and water are not available, use hand sanitizer.  Avoid contact with people who have cold symptoms.  Try not to touch your hands to your mouth, nose, or eyes.    Make sure to get the flu shot every year. Contact a health care provider if:  Your symptoms do not improve in 2 weeks of treatment. Get help right away if:  You cough up blood.  You have chest pain.  You have severe shortness of breath.  You become dehydrated.  You faint or keep feeling like you are going to faint.  You keep vomiting.  You have a severe headache.  Your  fever or chills gets worse. This information is not intended to replace advice given to you by your health care provider. Make sure you discuss any questions you have with your health care provider. Document Released: 05/01/2004 Document Revised: 11/05/2016 Document Reviewed: 09/12/2015 Elsevier Interactive Patient Education  2019 [place] [employer].  Flu test Negative Please take Azithromycin as directed. Please take Nino Parsley as needed. Use Cipro eye drops as directed. Remain well hydrated and rest often. Alternate OTC Acetaminophen and Ibuprofen as needed fever/discomfort. Please re-schedule regular medical follow-up appt. If symptoms persist after antibiotic completed, please call clinic. FEEL BETTER!

## 2018-05-05 NOTE — Assessment & Plan Note (Signed)
Flu test Negative Please take Azithromycin as directed. Please take Nino Parsley as needed. Use Cipro eye drops as directed. Remain well hydrated and rest often. Alternate OTC Acetaminophen and Ibuprofen as needed fever/discomfort. Please re-schedule regular medical follow-up appt. If symptoms persist after antibiotic completed, please call clinic.

## 2018-05-05 NOTE — Telephone Encounter (Signed)
Instill 1-2 gtt's in conjunctival sac every 2 hrs while awake for 2 days. Then 1-2 gtt's every 4 hours while awake for the next 5 days Thanks! Valetta Fuller

## 2018-05-05 NOTE — Telephone Encounter (Signed)
Estill Bamberg at [facility] given dosing instructions.  Charyl Bigger, CMA

## 2018-05-05 NOTE — Telephone Encounter (Signed)
Please clarify instructions for ciprofloxacin eye drops.  Charyl Bigger, CMA

## 2018-05-11 ENCOUNTER — Ambulatory Visit (INDEPENDENT_AMBULATORY_CARE_PROVIDER_SITE_OTHER): Payer: Self-pay

## 2018-05-11 ENCOUNTER — Ambulatory Visit (INDEPENDENT_AMBULATORY_CARE_PROVIDER_SITE_OTHER): Payer: Medicare Other | Admitting: Physical Medicine and Rehabilitation

## 2018-05-11 ENCOUNTER — Encounter (INDEPENDENT_AMBULATORY_CARE_PROVIDER_SITE_OTHER): Payer: Self-pay | Admitting: Physical Medicine and Rehabilitation

## 2018-05-11 VITALS — BP 139/67 | HR 64 | Temp 98.4°F

## 2018-05-11 DIAGNOSIS — M5416 Radiculopathy, lumbar region: Secondary | ICD-10-CM | POA: Diagnosis not present

## 2018-05-11 DIAGNOSIS — M48062 Spinal stenosis, lumbar region with neurogenic claudication: Secondary | ICD-10-CM | POA: Diagnosis not present

## 2018-05-11 MED ORDER — BETAMETHASONE SOD PHOS & ACET 6 (3-3) MG/ML IJ SUSP
12.0000 mg | Freq: Once | INTRAMUSCULAR | Status: AC
  Administered 2018-05-11: 12 mg

## 2018-05-11 NOTE — Progress Notes (Signed)
.  Numeric Pain Rating Scale and Functional Assessment Average Pain 7   In the last MONTH (on 0-10 scale) has pain interfered with the following?  1. General activity like being  able to carry out your everyday physical activities such as walking, climbing stairs, carrying groceries, or moving a chair?  Rating(7)   +Driver, -BT, -Dye Allergies.   

## 2018-05-12 NOTE — Progress Notes (Signed)
Subjective:    Patient ID: Jacob Harrison, male    DOB: Sep 07, 1940, 78 y.o.   MRN: [id number]  HPI:  11/27/16 OV: Jacob. Harrison is here for regular f/u: HTN, HL, T2D.  Due to bradycardia his cardiologist d/c'd toprol and started him on carvedilol 6.25mg  BID.  He was instructed to talke his BP and HR daily and if HR consistently <55 to call cards.  Today HR is 48 and he reports home HR 46-mid 50s.  He also has been experiencing dizziness with position changes and increased fatigue.  He has not called Dr. Bernita Buffy to report continued bradycardia. He continues to eat a heart healthy diet and "moves all day whether is be yeard work or exercise at the gym".   He reports medication compliance, denies SE. His last lipid panel looked great and cards continued her current statin therapy. He reports dramatic reduction in angina and has only required 1 nitrostat in the last 6 months.  02/25/17 OV: Jacob. Harrison is here for CPE. Due to bradycardia, cards instructed him to decrease carvedilol to 3.125mg  BID.  Then his BP increased and was consistently above goal and cards bumped carvedilol back to 6 mg BID.  BP improved and HR has remained >50.  Home HR 50-55 and dizziness has completely resolved. Home AM BS 110s, denies episodes of hypoglycemia.  He walks .5-1 mile 3-4 days week. He tries to follow low saturated fat/CHO/sugar diet and continues to abstain from tobacco use.  He enjoys whiskey/water 1-2 day.  Healthcare Maintenance: Colonoscopy- Not due until 2024, IFOBT cards provided today. AAA Screening- he has never smoked, therefore screening is not recommended.  07/30/17 OV: Jacob. Harrison presents for f/u: HTN, HLD, T2D Fasting labs obtained today He reports medication compliance, denies SE AM BS 105-120s, denies episodes of hypoglycemia  02/03/18 OV: Jacob. Harrison is here for f/u: HTN, T2D, HLD He was seen by cards/July 2019- murmur detected at [facility] 10/21/16 Echo did not show any significant valvular  disease or evidence of obstructive anatomy He reports AM BS 105-120, he denies episodes of hypoglycemia He remains active with yard/house work He has been working with [facility]/Dr. 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 Harrison is here for f/u: HTN, T2D, HLD He reports AM BS 140s He denies episodes of hypoglycemia He is unable to exercise due to spinal stenosis and r/t bil lower ext numbness/weakness He has been receiving back injections Q3M that is not providing much relief- surgery this spring once cardiac clearance completed. He has f/u with Cardiologist April 2020 HR 40-70s, he again denies any acute cardiac sx's with bradycardia- advised to mention occasional low HR to Cardiologist  He reports medication compliance, denies SE He reports resolution (with exception of occasional residual non-productive) of flu sx's- great! Lab Results  Component Value Date   HGBA1C 6.2 (A) 05/19/2018   HGBA1C 5.5 02/03/2018   HGBA1C 6.1 (H) 07/30/2017    Patient Care Team    Relationship Specialty Notifications Start End  Esaw Grandchild, NP PCP - General Family Medicine  05/20/16   End, Harrell Gave, MD PCP - Cardiology Cardiology Admissions 10/28/17     Patient Active Problem List   Diagnosis Date Noted  . Flu-like symptoms 05/05/2018  . Bacterial conjunctivitis of both eyes 04/27/2018  . Atypical mole 07/30/2017  . Healthcare maintenance 07/30/2017  . Wheezing 07/21/2017  .  Acute bronchitis 07/21/2017  . Neck pain 06/08/2017  . Sinus bradycardia 03/18/2017  . Screening for colon cancer 02/25/2017  . Ventral hernia without obstruction or gangrene 02/25/2017  . (HFpEF) heart failure with preserved ejection fraction ([place]) 09/28/2016  . Ischemic cardiomyopathy 09/28/2016  . Cough in adult 07/22/2016  . Vitamin D deficiency  06/27/2016  . Vitamin B deficiency 06/27/2016  . Hyperlipidemia LDL goal <70 06/21/2016  . Weakness of both lower extremities 06/21/2016  . Diabetes mellitus without complication ([place]) 98/02/9146  . Essential hypertension 05/20/2016  . Hyperlipidemia associated with type 2 diabetes mellitus ([place]) 05/20/2016  . Coronary artery disease 05/20/2016     Past Medical History:  Diagnosis Date  . (HFpEF) heart failure with preserved ejection fraction ([place])    a. 10/2016 Echo: EF 65-70%, mild LVH, mildly dil LA. RV fxn nl.  Marland Kitchen BPH (benign prostatic hyperplasia)   . Coronary artery disease    a. 2001 CABG x 3 [facility] LIMA->LAD, VG->LCX, VG->RPDA; b. 04/05/2010 Cath ([facility]): LM 40m, LAD 120m, LCX 2m, RCA 100p, LIMA->LAD atretic, VG->LCX 100, VG->RCA 80ost, 70d; c. 04/2010 Redo CABG x 2 ([place]): VG->LAD, VG->RPDA; d. 10/2012 Cath [facility]): LM 20-30, LAD 100/95-71m, LCX 40-85m, RCA 100p, 80-100d, RPDA 99, RPL1 95, VG->LAD ok, VG->RPDA ok, EF 67%; e. 11/2016 MV: EF 48%, No ischemia.  . Diabetes mellitus without complication ([place])   . Hyperlipidemia   . Scoliosis   . Spinal stenosis      Past Surgical History:  Procedure Laterality Date  . CORONARY ARTERY BYPASS GRAFT     x 2 (2001 and redo in late 2011 or early 2012)  . HERNIA REPAIR Left    inguinal  . TONSILLECTOMY AND ADENOIDECTOMY    . TRANSURETHRAL RESECTION OF PROSTATE  1990     Family History  Problem Relation Age of Onset  . Diabetes Mother   . Hyperlipidemia Mother   . Cancer Father        colon  . Alcohol abuse Maternal Uncle   . Diabetes Maternal Uncle   . Hyperlipidemia Maternal Uncle   . Cancer Paternal Uncle        colon     Social History   Substance and Sexual Activity  Drug Use No     Social History   Substance and Sexual Activity  Alcohol Use Yes  . Alcohol/week: 11.0 standard drinks  . Types: 5 Glasses of wine, 6 Cans of beer per week     Social History   Tobacco Use  Smoking Status Never  Smoker  Smokeless Tobacco Never Used     Outpatient Encounter Medications as of 05/19/2018  Medication Sig  . Albuterol Sulfate (PROAIR RESPICLICK) 829 (90 Base) MCG/ACT AEPB Inhale 2 puffs into the lungs every 6 (six) hours as needed.  Marland Kitchen aspirin EC 81 MG tablet Take 1 tablet (81 mg total) by mouth daily.  . carvedilol (COREG) 6.25 MG tablet Take 1 tablet (6.25 mg total) by mouth 2 (two) times daily.  . Cholecalciferol (VITAMIN D-3 PO) Take 800 Units by mouth daily.  . Coenzyme Q10 (CO Q-10) 200 MG CAPS Take 1 capsule by mouth daily.  . cyanocobalamin 500 MCG tablet Take 500 mcg by mouth daily.  Marland Kitchen ezetimibe (ZETIA) 10 MG tablet TAKE 1 TABLET BY MOUTH ONCE DAILY  . finasteride (PROSCAR) 5 MG tablet TAKE 1 TABLET BY MOUTH ONCE DAILY  . fluticasone (FLONASE) 50 MCG/ACT nasal spray Place 2 sprays into both nostrils daily.  . Glucosamine-Chondroit-Vit C-Mn (GLUCOSAMINE  1500 COMPLEX) CAPS Take 1 capsule by mouth daily.  . Lancets (ONETOUCH ULTRASOFT) lancets Use to check blood sugars fasting daily  . metFORMIN (GLUCOPHAGE) 500 MG tablet Take 1 tablet (500 mg total) by mouth daily.  . nitroGLYCERIN (NITROSTAT) 0.4 MG SL tablet Place 1 tablet under the tongue as needed.  . ranolazine (RANEXA) 1000 MG SR tablet TAKE 1  BY MOUTH TWICE DAILY  . rosuvastatin (CRESTOR) 20 MG tablet TAKE 1 TABLET BY MOUTH AT BEDTIME  . [DISCONTINUED] azithromycin (ZITHROMAX) 250 MG tablet 2 tabs day one. 1 tab days two-five  . [DISCONTINUED] benzonatate (TESSALON) 200 MG capsule Take 1 capsule (200 mg total) by mouth 2 (two) times daily as needed for cough.  . [DISCONTINUED] ciprofloxacin (CILOXAN) 0.3 % ophthalmic solution Place 2 drops into both eyes every 2 (two) hours. Administer 1 drop, every 2 hours, while awake, for 2 days. Then 1 drop, every 4 hours, while awake, for the next 5 days.   Facility-Administered Encounter Medications as of 05/19/2018  Medication  . ipratropium-albuterol (DUONEB) 0.5-2.5 (3) MG/3ML  nebulizer solution 3 mL    Allergies: Tetracyclines & related  Body mass index is 31.56 kg/m.  Blood pressure 121/70, pulse (!) 58, temperature 98.7 F (37.1 C), temperature source Oral, height 5\' 7"  (1.702 m), weight 201 lb 8 oz (91.4 kg), SpO2 96 %.  Review of Systems  Constitutional: Positive for fatigue. Negative for activity change, appetite change, chills, diaphoresis, fever and unexpected weight change.  Eyes: Negative for visual disturbance.  Respiratory: Negative for cough, chest tightness, shortness of breath, wheezing and stridor.   Cardiovascular: Negative for chest pain, palpitations and leg swelling.       Today 40 He denies dizziness or increase in fatigue  Gastrointestinal: Negative for abdominal distention, abdominal pain, blood in stool, constipation, diarrhea, nausea and vomiting.  Endocrine: Negative for cold intolerance, heat intolerance, polydipsia, polyphagia and polyuria.  Genitourinary: Negative for difficulty urinating, flank pain and hematuria.  Musculoskeletal: Positive for arthralgias, back pain and myalgias. Negative for gait problem, joint swelling, neck pain and neck stiffness.  Skin: Negative for color change, pallor, rash and wound.  Neurological: Negative for dizziness, tremors, weakness and light-headedness.  Hematological: Does not bruise/bleed easily.  Psychiatric/Behavioral: Negative for decreased concentration, hallucinations, self-injury, sleep disturbance and suicidal ideas. The patient is not nervous/anxious and is not hyperactive.       Objective:   Physical Exam  Constitutional: He is oriented to person, place, and time. He appears well-developed and well-nourished. No distress.  HENT:  Head: Normocephalic and atraumatic.  Right Ear: External ear normal. No decreased hearing is noted.  Left Ear: External ear normal. No decreased hearing is noted.  Eyes: Pupils are equal, round, and reactive to light. Conjunctivae and EOM are normal.   Neck: Normal range of motion. Neck supple.  Cardiovascular: Regular rhythm and intact distal pulses. Bradycardia present.  Murmur heard. Pulmonary/Chest: Effort normal and breath sounds normal. No respiratory distress. He has no wheezes. He has no rales. He exhibits no tenderness.  Lymphadenopathy:    He has no cervical adenopathy.  Neurological: He is alert and oriented to person, place, and time. Coordination normal.  Skin: Skin is warm and dry. No rash noted. He is not diaphoretic. No erythema. No pallor.  Psychiatric: He has a normal mood and affect. His behavior is normal. Judgment and thought content normal.  Nursing note and vitals reviewed.     Assessment & Plan:   1. Diabetes mellitus without complication (  [place])   2. Healthcare maintenance   3. Essential hypertension   4. Hyperlipidemia associated with type 2 diabetes mellitus ([place])     Healthcare maintenance Continue all medications as directed. Since you are not experiencing symptoms with low heart-rate, just continue to monitor and just mention to Cardiologist at your follow-up in April. Continue to drink plenty of water and consistently follow Diabetic diet. Recommend Medicare Wellness in 3 months, please schedule fasting labs the week prior.  Diabetes mellitus without complication ([place]) Lab Results  Component Value Date   HGBA1C 6.2 (A) 05/19/2018   HGBA1C 5.5 02/03/2018   HGBA1C 6.1 (H) 07/30/2017  AM BS 140s, denies episodes of hypoglycemia Continue Metformin 500mg  QD  Essential hypertension BP at goal 121/70, HR 58 He continues to deny dizziness or increase in fatigue Advised to mention bradycardia to Cardiologist at f/u in April, call them sooner if sx's develop Pt verbalized understanding/agreement   Hyperlipidemia associated with type 2 diabetes mellitus ([place]) Will check lipids at f/u in 3 months Last LDL at goal- 07/30/2017- 65 Continue rosuvastatin 20mg  QD    FOLLOW-UP:  No follow-ups on  file.

## 2018-05-17 NOTE — Progress Notes (Signed)
Jacob Harrison - 78 y.o. male MRN [id number]  Date of birth: 1941-02-22  Office Visit Note: Visit Date: 05/11/2018 PCP: Esaw Grandchild, NP Referred by: Esaw Grandchild, NP  Subjective: Chief Complaint  Patient presents with  . Right Leg - Pain, Numbness  . Left Leg - Pain, Numbness   HPI:  Jacob Harrison is a 78 y.o. male who comes in today For planned bilateral L4 transforaminal epidural steroid injection.  Patient has severe lumbar stenosis and concordant low back pain with radicular pain and neurogenic claudication.  Prior injection was in October and he just had return of symptoms over the last few weeks.  No new trauma.  Discussed surgical options and medication options and will repeat injection today.  ROS Otherwise per HPI.  Assessment & Plan: Visit Diagnoses:  1. Lumbar radiculopathy   2. Spinal stenosis of lumbar region with neurogenic claudication     Plan: No additional findings.   Meds & Orders:  Meds ordered this encounter  Medications  . betamethasone acetate-betamethasone sodium phosphate (CELESTONE) injection 12 mg    Orders Placed This Encounter  Procedures  . XR C-ARM NO REPORT  . Epidural Steroid injection    Follow-up: Return if symptoms worsen or fail to improve.   Procedures: No procedures performed  Lumbosacral Transforaminal Epidural Steroid Injection - Sub-Pedicular Approach with Fluoroscopic Guidance  Patient: Jacob Harrison      Date of Birth: 1940/10/07 MRN: [id number] PCP: Esaw Grandchild, NP      Visit Date: 05/11/2018   Universal Protocol:    Date/Time: 05/11/2018  Consent Given By: the patient  Position: PRONE  Additional Comments: Vital signs were monitored before and after the procedure. Patient was prepped and draped in the usual sterile fashion. The correct patient, procedure, and site was verified.   Injection Procedure Details:  Procedure Site One Meds Administered:  Meds ordered this encounter  Medications  .  betamethasone acetate-betamethasone sodium phosphate (CELESTONE) injection 12 mg    Laterality: Bilateral  Location/Site:  L4-L5  Needle size: 22 G  Needle type: Spinal  Needle Placement: Transforaminal  Findings:    -Comments: Excellent flow of contrast along the nerve and into the epidural space.  Procedure Details: After squaring off the end-plates to get a true AP view, the C-arm was positioned so that an oblique view of the foramen as noted above was visualized. The target area is just inferior to the "nose of the scotty dog" or sub pedicular. The soft tissues overlying this structure were infiltrated with 2-3 ml. of 1% Lidocaine without Epinephrine.  The spinal needle was inserted toward the target using a "trajectory" view along the fluoroscope beam.  Under AP and lateral visualization, the needle was advanced so it did not puncture dura and was located close the 6 O'Clock position of the pedical in AP tracterory. Biplanar projections were used to confirm position. Aspiration was confirmed to be negative for CSF and/or blood. A 1-2 ml. volume of Isovue-250 was injected and flow of contrast was noted at each level. Radiographs were obtained for documentation purposes.   After attaining the desired flow of contrast documented above, a 0.5 to 1.0 ml test dose of 0.25% Marcaine was injected into each respective transforaminal space.  The patient was observed for 90 seconds post injection.  After no sensory deficits were reported, and normal lower extremity motor function was noted,   the above injectate was administered so that equal amounts of the injectate were placed at each  foramen (level) into the transforaminal epidural space.   Additional Comments:  The patient tolerated the procedure well Dressing: Band-Aid    Post-procedure details: Patient was observed during the procedure. Post-procedure instructions were reviewed.  Patient left the clinic in stable condition.     Clinical History: MRI LUMBAR SPINE WITHOUT CONTRAST  TECHNIQUE: Multiplanar, multisequence MR imaging of the lumbar spine was performed. No intravenous contrast was administered.  COMPARISON:  Lumbar spine radiographs 09/01/2017  FINDINGS: Segmentation:  Normal  Alignment:  Normal  Vertebrae:  Negative for fracture or mass.  Normal bone marrow.  Conus medullaris and cauda equina: Conus extends to the L1-2 level. Conus and cauda equina appear normal.  Paraspinal and other soft tissues: Negative for paraspinous mass or soft tissue swelling  Disc levels:  T12-L1: Negative  L1-2: Mild disc and mild facet degeneration with mild spinal stenosis  L2-3: Moderate disc bulging and mild facet hypertrophy. Moderately severe spinal stenosis. Neural foramina patent bilaterally. Subarticular stenosis left greater than right  L3-4: Diffuse bulging of the disc with endplate spurring. Moderate facet and ligamentum flavum hypertrophy causing severe spinal stenosis. Marked subarticular stenosis bilaterally.  L4-5: Severe spinal stenosis. Diffuse disc bulging and endplate spurring right greater than left. Endplate edema on the right. Moderate facet and ligamentum flavum hypertrophy bilaterally. Marked subarticular stenosis bilaterally.  L5-S1: Large central disc and osteophyte complex with associated disc space narrowing. This appears chronic. There is displacement of the right S1 nerve root which is not compressed. Subarticular stenosis due to spurring bilaterally.  IMPRESSION: Extensive multilevel lumbar degenerative change.  Mild spinal stenosis L2-3  Moderately severe spinal stenosis L2-3 with subarticular stenosis left greater than right  Severe spinal stenosis L3-4 with marked subarticular stenosis bilaterally  Severe spinal stenosis L4-5 with marked subarticular stenosis bilaterally  Large central disc and osteophyte complex with subarticular  stenosis bilaterally. Mild displacement of the right S1 nerve root.   Electronically Signed   By: Franchot Gallo M.D.   On: 09/22/2017 10:44  2-3 view lumbar spine x-ray dated 12/30/2016  AP lateral lumbar x-rays are obtained and reviewed. This shows changes  consistent with ankylosing spondylitis with the lateral and anterior  bridging osteophytes the upper and mid level lumbar region. He has some  curvature less than 20 the lumbar spine. Facet arthropathy is noted.  Impression: Lumbar disc degeneration with ankylosis and significant  endplate spurring laterally and anteriorly at multiple levels     Objective:  VS:  HT:    WT:   BMI:     BP:139/67  HR:64bpm  TEMP:98.4 F (36.9 C)(Oral)  RESP:  Physical Exam  Ortho Exam Imaging: No results found.

## 2018-05-17 NOTE — Procedures (Signed)
Lumbosacral Transforaminal Epidural Steroid Injection - Sub-Pedicular Approach with Fluoroscopic Guidance  Patient: Jacob Harrison      Date of Birth: Mar 16, 1941 MRN: [id number] PCP: Esaw Grandchild, NP      Visit Date: 05/11/2018   Universal Protocol:    Date/Time: 05/11/2018  Consent Given By: the patient  Position: PRONE  Additional Comments: Vital signs were monitored before and after the procedure. Patient was prepped and draped in the usual sterile fashion. The correct patient, procedure, and site was verified.   Injection Procedure Details:  Procedure Site One Meds Administered:  Meds ordered this encounter  Medications  . betamethasone acetate-betamethasone sodium phosphate (CELESTONE) injection 12 mg    Laterality: Bilateral  Location/Site:  L4-L5  Needle size: 22 G  Needle type: Spinal  Needle Placement: Transforaminal  Findings:    -Comments: Excellent flow of contrast along the nerve and into the epidural space.  Procedure Details: After squaring off the end-plates to get a true AP view, the C-arm was positioned so that an oblique view of the foramen as noted above was visualized. The target area is just inferior to the "nose of the scotty dog" or sub pedicular. The soft tissues overlying this structure were infiltrated with 2-3 ml. of 1% Lidocaine without Epinephrine.  The spinal needle was inserted toward the target using a "trajectory" view along the fluoroscope beam.  Under AP and lateral visualization, the needle was advanced so it did not puncture dura and was located close the 6 O'Clock position of the pedical in AP tracterory. Biplanar projections were used to confirm position. Aspiration was confirmed to be negative for CSF and/or blood. A 1-2 ml. volume of Isovue-250 was injected and flow of contrast was noted at each level. Radiographs were obtained for documentation purposes.   After attaining the desired flow of contrast documented above, a 0.5  to 1.0 ml test dose of 0.25% Marcaine was injected into each respective transforaminal space.  The patient was observed for 90 seconds post injection.  After no sensory deficits were reported, and normal lower extremity motor function was noted,   the above injectate was administered so that equal amounts of the injectate were placed at each foramen (level) into the transforaminal epidural space.   Additional Comments:  The patient tolerated the procedure well Dressing: Band-Aid    Post-procedure details: Patient was observed during the procedure. Post-procedure instructions were reviewed.  Patient left the clinic in stable condition.

## 2018-05-19 ENCOUNTER — Encounter: Payer: Self-pay | Admitting: Adult Health

## 2018-05-19 ENCOUNTER — Ambulatory Visit (INDEPENDENT_AMBULATORY_CARE_PROVIDER_SITE_OTHER): Payer: Medicare Other | Admitting: Adult Health

## 2018-05-19 VITALS — BP 121/70 | HR 58 | Temp 98.7°F | Ht 67.0 in | Wt 201.5 lb

## 2018-05-19 DIAGNOSIS — E785 Hyperlipidemia, unspecified: Secondary | ICD-10-CM

## 2018-05-19 DIAGNOSIS — E119 Type 2 diabetes mellitus without complications: Secondary | ICD-10-CM

## 2018-05-19 DIAGNOSIS — Z0000 Encounter for general adult medical examination without abnormal findings: Secondary | ICD-10-CM | POA: Diagnosis not present

## 2018-05-19 DIAGNOSIS — I10 Essential (primary) hypertension: Secondary | ICD-10-CM

## 2018-05-19 DIAGNOSIS — E1169 Type 2 diabetes mellitus with other specified complication: Secondary | ICD-10-CM | POA: Diagnosis not present

## 2018-05-19 LAB — POCT GLYCOSYLATED HEMOGLOBIN (HGB A1C): HEMOGLOBIN A1C: 6.2 % — AB (ref 4.0–5.6)

## 2018-05-19 NOTE — Assessment & Plan Note (Addendum)
Lab Results  Component Value Date   HGBA1C 6.2 (A) 05/19/2018   HGBA1C 5.5 02/03/2018   HGBA1C 6.1 (H) 07/30/2017  AM BS 140s, denies episodes of hypoglycemia Continue Metformin 500mg  QD

## 2018-05-19 NOTE — Patient Instructions (Addendum)

## 2018-05-19 NOTE — Assessment & Plan Note (Signed)
Will check lipids at f/u in 3 months Last LDL at goal- 07/30/2017- 65 Continue rosuvastatin 20mg  QD

## 2018-05-19 NOTE — Assessment & Plan Note (Signed)
BP at goal 121/70, HR 58 He continues to deny dizziness or increase in fatigue Advised to mention bradycardia to Cardiologist at f/u in April, call them sooner if sx's develop Pt verbalized understanding/agreement

## 2018-05-19 NOTE — Assessment & Plan Note (Signed)
Continue all medications as directed. Since you are not experiencing symptoms with low heart-rate, just continue to monitor and just mention to Cardiologist at your follow-up in April. Continue to drink plenty of water and consistently follow Diabetic diet. Recommend Medicare Wellness in 3 months, please schedule fasting labs the week prior.

## 2018-06-08 ENCOUNTER — Other Ambulatory Visit: Payer: Self-pay | Admitting: Adult Health

## 2018-06-15 ENCOUNTER — Encounter (INDEPENDENT_AMBULATORY_CARE_PROVIDER_SITE_OTHER): Payer: Self-pay | Admitting: Orthopaedic Surgery

## 2018-06-15 ENCOUNTER — Ambulatory Visit (INDEPENDENT_AMBULATORY_CARE_PROVIDER_SITE_OTHER): Payer: Medicare Other | Admitting: Orthopaedic Surgery

## 2018-06-15 VITALS — BP 138/84 | HR 77 | Ht 67.0 in | Wt 200.0 lb

## 2018-06-15 DIAGNOSIS — M48062 Spinal stenosis, lumbar region with neurogenic claudication: Secondary | ICD-10-CM

## 2018-06-15 NOTE — Progress Notes (Deleted)
Post-Op Visit Note   Patient: Jacob Harrison           Date of Birth: 08/26/1940           MRN: [id number] Visit Date: 06/15/2018 PCP: Esaw Grandchild, NP   Assessment & Plan:  Chief Complaint:  Chief Complaint  Patient presents with  . Back Pain    wants to discuss surgery   Visit Diagnoses: No diagnosis found.  Plan: ***  Follow-Up Instructions: Return in about 2 weeks (around 06/29/2018).   Orders:  No orders of the defined types were placed in this encounter.  No orders of the defined types were placed in this encounter.   Imaging: No results found.  PMFS History: Patient Active Problem List   Diagnosis Date Noted  . Flu-like symptoms 05/05/2018  . Bacterial conjunctivitis of both eyes 04/27/2018  . Atypical mole 07/30/2017  . Healthcare maintenance 07/30/2017  . Wheezing 07/21/2017  . Acute bronchitis 07/21/2017  . Neck pain 06/08/2017  . Sinus bradycardia 03/18/2017  . Screening for colon cancer 02/25/2017  . Ventral hernia without obstruction or gangrene 02/25/2017  . (HFpEF) heart failure with preserved ejection fraction ([place]) 09/28/2016  . Ischemic cardiomyopathy 09/28/2016  . Cough in adult 07/22/2016  . Vitamin D deficiency 06/27/2016  . Vitamin B deficiency 06/27/2016  . Hyperlipidemia LDL goal <70 06/21/2016  . Weakness of both lower extremities 06/21/2016  . Diabetes mellitus without complication ([place]) 09/73/5329  . Essential hypertension 05/20/2016  . Hyperlipidemia associated with type 2 diabetes mellitus ([place]) 05/20/2016  . Coronary artery disease 05/20/2016   Past Medical History:  Diagnosis Date  . (HFpEF) heart failure with preserved ejection fraction ([place])    a. 10/2016 Echo: EF 65-70%, mild LVH, mildly dil LA. RV fxn nl.  Marland Kitchen BPH (benign prostatic hyperplasia)   . Coronary artery disease    a. 2001 CABG x 3 [facility]): LIMA->LAD, VG->LCX, VG->RPDA; b. 04/05/2010 Cath ([facility]): LM 30m, LAD 158m, LCX 35m, RCA 100p, LIMA->LAD  atretic, VG->LCX 100, VG->RCA 80ost, 70d; c. 04/2010 Redo CABG x 2 ([place]): VG->LAD, VG->RPDA; d. 10/2012 Cath [facility]): LM 20-30, LAD 100/95-53m, LCX 40-74m, RCA 100p, 80-100d, RPDA 99, RPL1 95, VG->LAD ok, VG->RPDA ok, EF 67%; e. 11/2016 MV: EF 48%, No ischemia.  . Diabetes mellitus without complication ([place])   . Hyperlipidemia   . Scoliosis   . Spinal stenosis     Family History  Problem Relation Age of Onset  . Diabetes Mother   . Hyperlipidemia Mother   . Cancer Father        colon  . Alcohol abuse Maternal Uncle   . Diabetes Maternal Uncle   . Hyperlipidemia Maternal Uncle   . Cancer Paternal Uncle        colon    Past Surgical History:  Procedure Laterality Date  . CORONARY ARTERY BYPASS GRAFT     x 2 (2001 and redo in late 2011 or early 2012)  . HERNIA REPAIR Left    inguinal  . TONSILLECTOMY AND ADENOIDECTOMY    . TRANSURETHRAL RESECTION OF PROSTATE  1990   Social History   Occupational History  . Not on file  Tobacco Use  . Smoking status: Never Smoker  . Smokeless tobacco: Never Used  Substance and Sexual Activity  . Alcohol use: Yes    Alcohol/week: 11.0 standard drinks    Types: 5 Glasses of wine, 6 Cans of beer per week  . Drug use: No  . Sexual activity: Not Currently  Birth control/protection: None

## 2018-06-15 NOTE — Progress Notes (Signed)
Office Visit Note   Patient: Jacob Harrison           Date of Birth: [birth year]           MRN: [id number] Visit Date: 06/15/2018              Requested by: Esaw Grandchild, NP [place], [place] [address] PCP: Esaw Grandchild, NP   Assessment & Plan: Visit Diagnoses:  1. Spinal stenosis of lumbar region with neurogenic claudication     Plan: Patient states his pain is progressed.  He started going places where he is to stop and sit after walking 3 to 400 feet after sit for 5 minutes and then get up and walk again the same distance.  MRI scan shows severe stenosis at L3-4 and L4-5 with milder changes at other levels.  Plan will be to level surgical central decompression with overnight stay.  Risks of dural tear progression of lumbar stenosis possible progression of other levels discussed.  He will need cardiac medical clearance preoperatively for his diabetes and past history of coronary artery disease.  He has an upcoming appointment with his cardiologist.  Questions were elicited and answered.  Follow-Up Instructions: No follow-ups on file.   Orders:  No orders of the defined types were placed in this encounter.  No orders of the defined types were placed in this encounter.     Procedures: No procedures performed   Clinical Data: No additional findings.   Subjective: Chief Complaint  Patient presents with  . Back Pain    wants to discuss surgery    HPI 78 year old male returns with ongoing problems with his back and claudication symptoms that occur after walking 40 feet.  He has to stop and sit for 5 minutes and is able to repeat the distance.  He can stand 5 minutes he does better if he leans forward over a grocery cart and can walk all around the store as long as he leans on the cart.  He denies associated bowel or bladder symptoms.  He can stand 5 to 10 minutes and has to sit he ambulates with a cane no pain when he sitting or laying down.  He did not  radiate to her back and his buttocks.  Last A1c was 6.2 on 05/19/2018 and he is here to discuss surgical intervention post MRI.  MRI showed severe stenosis at L3-4 L4-5 with mild to moderate at 2 3 and some disc protrusion at 5 1 which was stable.  Review of Systems positive for vitamin D deficiency hyperlipidemia type 2 diabetes coronary artery disease with previous surgery.  Hypertension history of ischemic cardiomyopathy.  Neurogenic claudication symptoms.  ABIs 2 years ago normal.  History of bronchitis bradycardia.  14 point review of systems otherwise negative is a pertains HPI.   Objective: Vital Signs: BP 138/84   Pulse 77   Ht 5\' 7"  (1.702 m)   Wt 200 lb (90.7 kg)   BMI 31.32 kg/m   Physical Exam Constitutional:      Appearance: He is well-developed.  HENT:     Head: Normocephalic and atraumatic.  Eyes:     Pupils: Pupils are equal, round, and reactive to light.  Neck:     Thyroid: No thyromegaly.     Trachea: No tracheal deviation.  Cardiovascular:     Rate and Rhythm: Normal rate.  Pulmonary:     Effort: Pulmonary effort is normal.     Breath sounds: No  wheezing.  Abdominal:     General: Bowel sounds are normal.     Palpations: Abdomen is soft.  Skin:    General: Skin is warm and dry.     Capillary Refill: Capillary refill takes less than 2 seconds.  Neurological:     Mental Status: He is alert and oriented to person, place, and time.  Psychiatric:        Behavior: Behavior normal.        Thought Content: Thought content normal.        Judgment: Judgment normal.     Ortho Exam patient has intact pedal pulses.  No pain with hip range of motion.  He is able to get from sitting to standing and can ambulate across exam room.  No hip flexion weakness.  Knees reach full extension.  Specialty Comments:  No specialty comments available.  Imaging:CLINICAL DATA:  Chronic low back pain  EXAM: MRI LUMBAR SPINE WITHOUT CONTRAST  TECHNIQUE: Multiplanar,  multisequence MR imaging of the lumbar spine was performed. No intravenous contrast was administered.  COMPARISON:  Lumbar spine radiographs 09/01/2017  FINDINGS: Segmentation:  Normal  Alignment:  Normal  Vertebrae:  Negative for fracture or mass.  Normal bone marrow.  Conus medullaris and cauda equina: Conus extends to the L1-2 level. Conus and cauda equina appear normal.  Paraspinal and other soft tissues: Negative for paraspinous mass or soft tissue swelling  Disc levels:  T12-L1: Negative  L1-2: Mild disc and mild facet degeneration with mild spinal stenosis  L2-3: Moderate disc bulging and mild facet hypertrophy. Moderately severe spinal stenosis. Neural foramina patent bilaterally. Subarticular stenosis left greater than right  L3-4: Diffuse bulging of the disc with endplate spurring. Moderate facet and ligamentum flavum hypertrophy causing severe spinal stenosis. Marked subarticular stenosis bilaterally.  L4-5: Severe spinal stenosis. Diffuse disc bulging and endplate spurring right greater than left. Endplate edema on the right. Moderate facet and ligamentum flavum hypertrophy bilaterally. Marked subarticular stenosis bilaterally.  L5-S1: Large central disc and osteophyte complex with associated disc space narrowing. This appears chronic. There is displacement of the right S1 nerve root which is not compressed. Subarticular stenosis due to spurring bilaterally.  IMPRESSION: Extensive multilevel lumbar degenerative change.  Mild spinal stenosis L2-3  Moderately severe spinal stenosis L2-3 with subarticular stenosis left greater than right  Severe spinal stenosis L3-4 with marked subarticular stenosis bilaterally  Severe spinal stenosis L4-5 with marked subarticular stenosis bilaterally  Large central disc and osteophyte complex with subarticular stenosis bilaterally. Mild displacement of the right S1 nerve  root.   Electronically Signed   By: Franchot Gallo M.D.   On: 09/22/2017 10:44     PMFS History: Patient Active Problem List   Diagnosis Date Noted  . Flu-like symptoms 05/05/2018  . Bacterial conjunctivitis of both eyes 04/27/2018  . Atypical mole 07/30/2017  . Healthcare maintenance 07/30/2017  . Wheezing 07/21/2017  . Acute bronchitis 07/21/2017  . Neck pain 06/08/2017  . Sinus bradycardia 03/18/2017  . Screening for colon cancer 02/25/2017  . Ventral hernia without obstruction or gangrene 02/25/2017  . (HFpEF) heart failure with preserved ejection fraction ([place]) 09/28/2016  . Ischemic cardiomyopathy 09/28/2016  . Cough in adult 07/22/2016  . Vitamin D deficiency 06/27/2016  . Vitamin B deficiency 06/27/2016  . Hyperlipidemia LDL goal <70 06/21/2016  . Weakness of both lower extremities 06/21/2016  . Diabetes mellitus without complication ([place]) 66/09/3014  . Essential hypertension 05/20/2016  . Hyperlipidemia associated with type 2 diabetes mellitus ([place]) 05/20/2016  .  Coronary artery disease 05/20/2016   Past Medical History:  Diagnosis Date  . (HFpEF) heart failure with preserved ejection fraction ([place])    a. 10/2016 Echo: EF 65-70%, mild LVH, mildly dil LA. RV fxn nl.  Marland Kitchen BPH (benign prostatic hyperplasia)   . Coronary artery disease    a. 2001 CABG x 3 [facility]): LIMA->LAD, VG->LCX, VG->RPDA; b. 04/05/2010 Cath ([facility]): LM 38m, LAD 139m, LCX 96m, RCA 100p, LIMA->LAD atretic, VG->LCX 100, VG->RCA 80ost, 70d; c. 04/2010 Redo CABG x 2 ([facility]): VG->LAD, VG->RPDA; d. 10/2012 Cath [facility]): LM 20-30, LAD 100/95-22m, LCX 40-62m, RCA 100p, 80-100d, RPDA 99, RPL1 95, VG->LAD ok, VG->RPDA ok, EF 67%; e. 11/2016 MV: EF 48%, No ischemia.  . Diabetes mellitus without complication ([place])   . Hyperlipidemia   . Scoliosis   . Spinal stenosis     Family History  Problem Relation Age of Onset  . Diabetes Mother   . Hyperlipidemia Mother   . Cancer Father        colon  .  Alcohol abuse Maternal Uncle   . Diabetes Maternal Uncle   . Hyperlipidemia Maternal Uncle   . Cancer Paternal Uncle        colon    Past Surgical History:  Procedure Laterality Date  . CORONARY ARTERY BYPASS GRAFT     x 2 (2001 and redo in late 2011 or early 2012)  . HERNIA REPAIR Left    inguinal  . TONSILLECTOMY AND ADENOIDECTOMY    . TRANSURETHRAL RESECTION OF PROSTATE  1990   Social History   Occupational History  . Not on file  Tobacco Use  . Smoking status: Never Smoker  . Smokeless tobacco: Never Used  Substance and Sexual Activity  . Alcohol use: Yes    Alcohol/week: 11.0 standard drinks    Types: 5 Glasses of wine, 6 Cans of beer per week  . Drug use: No  . Sexual activity: Not Currently    Birth control/protection: None

## 2018-06-17 ENCOUNTER — Telehealth: Payer: Self-pay | Admitting: Internal Medicine

## 2018-06-17 NOTE — Telephone Encounter (Signed)
   Primary Cardiologist:Jacob End, MD  Chart reviewed as part of pre-operative protocol coverage. Because of Jacob Harrison's past medical history and time since last visit, he/she will require a follow-up visit in order to better assess preoperative cardiovascular risk.  I left a message for pt -if he could wait for clearance until his appt with Dr. Saunders Harrison in April or if we needed to make an earlier appt.  He should call back.   Pre-op covering staff: - Please schedule appointment and call patient to inform them. - Please contact requesting surgeon's office via preferred method (i.e, phone, fax) to inform them of need for appointment prior to surgery.  If applicable, this message will also be routed to pharmacy pool and/or primary cardiologist for input on holding anticoagulant/antiplatelet agent as requested below so that this information is available at time of patient's appointment.   Jacob Kicks, NP  06/17/2018, 4:48 PM

## 2018-06-17 NOTE — Telephone Encounter (Signed)
° °  [facility] Pre-operative Risk Assessment    Request for surgical clearance:  1. What type of surgery is being performed? Lumbar Decompression   2. When is this surgery scheduled? TBD  3. What type of clearance is required (medical clearance vs. Pharmacy clearance to hold med vs. Both)? Medical   4. Are there any medications that need to be held prior to surgery and how long? Not noted   5. Practice name and name of physician performing surgery?  [facility] Dr. Lorin Mercy   6. What is your office phone number [phone]    7.   What is your office fax number [phone]   8.   Anesthesia type (None, local, MAC, general) ? Not noted    Clarisse Gouge 06/17/2018, 4:07 PM  _________________________________________________________________   (provider comments below)

## 2018-06-22 NOTE — Telephone Encounter (Signed)
Spoke with pt he is okay with waiting to be cleared for his surgical at his appointment with Dr. Saunders Revel.

## 2018-06-22 NOTE — Telephone Encounter (Signed)
   Primary Cardiologist: Nelva Bush, MD  Chart reviewed as part of pre-operative protocol coverage.   Please reach out to patient to clarify if he is OK to wait for appointment with Dr. Saunders Revel for clearance. Mickel Baas had called him but I don't see any f/u info. Ortho note seems to allude to this being OK. We are not scheduling any non-urgent appointments in our office at this time so I believe it would be prudent to keep that appointment to discuss clearance.  Charlie Pitter, PA-C 06/22/2018, 9:43 AM

## 2018-06-23 IMAGING — NM NM MISC PROCEDURE
3 series · 18 of 18 positions shown · non-contrast
Comparison: none

[Series 1: stress-sum-em_(id)_sa · 6.4mm · 6.40mm/px · 6 of 64 frames shown]
[frame 6/64]
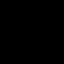
[frame 16/64]
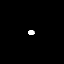
[frame 27/64]
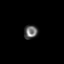
[frame 38/64]
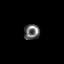
[frame 48/64]
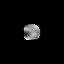
[frame 59/64]
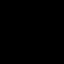

[Series 1: rest_(id)_sa · 6.4mm · 6.40mm/px · 6 of 64 frames shown]
[frame 6/64]
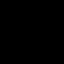
[frame 16/64]
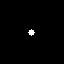
[frame 27/64]
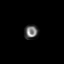
[frame 38/64]
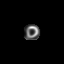
[frame 48/64]
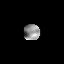
[frame 59/64]
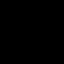

[Series 1: stress-gsp_(id)_sa · 6.4mm · 6.40mm/px · 6 of 512 frames shown]
[frame 43/512]
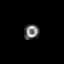
[frame 128/512]
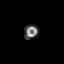
[frame 214/512]
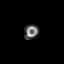
[frame 299/512]
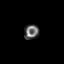
[frame 384/512]
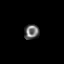
[frame 470/512]
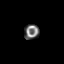

[18 of 18 positions shown; findings below may reference images not displayed]

Canned report from images found in remote index.

Refer to host system for actual result text.

## 2018-07-09 ENCOUNTER — Telehealth: Payer: Self-pay | Admitting: *Deleted

## 2018-07-09 NOTE — Telephone Encounter (Signed)
Given that surgery is not imminent, I would favor delaying preop visit so that he can be evaluated in person once coronavirus precautions have been eased.  If surgery needs to be done before this, we can do a virtual visit in the future.  Nelva Bush, MD [facility] Pager: [phone]

## 2018-07-09 NOTE — Telephone Encounter (Signed)
I spoke with pt concerning his upcoming OV w/Dr. End. Pt is aware that Dr.End is doing virtual visits at this time. Pt mentioned that his procedure doesn't have a set date and that his surgeon would schedule him once he has been cleared. Pt mentioned that his procedure isn't urgent and that he is only having mild pain off and on. He would like to push his appointment out a few wks due to it not being urgent and the pain being tolerable at this time. Pt mentioned that if he has to do a virtual visit/or ov visit for clearance he would be ok with either one.  Please contact pt to schedule future appointment for Early May per pt request.

## 2018-07-14 ENCOUNTER — Other Ambulatory Visit: Payer: Self-pay | Admitting: Internal Medicine

## 2018-07-14 DIAGNOSIS — I25118 Atherosclerotic heart disease of native coronary artery with other forms of angina pectoris: Secondary | ICD-10-CM

## 2018-07-15 NOTE — Telephone Encounter (Signed)
Recall in Epic.  Removing from COVID pool as Evisit not needed

## 2018-07-21 ENCOUNTER — Ambulatory Visit: Payer: Medicare Other | Admitting: Internal Medicine

## 2018-07-29 ENCOUNTER — Telehealth: Payer: Self-pay | Admitting: Internal Medicine

## 2018-07-29 NOTE — Telephone Encounter (Signed)
Virtual Visit Pre-Appointment Phone Call  "(Name), I am calling you today to discuss your upcoming appointment. We are currently trying to limit exposure to the virus that causes COVID-19 by seeing patients at home rather than in the office."  1. "What is the BEST phone number to call the day of the visit?" - include this in appointment notes  2. Do you have or have access to (through a family member/friend) a smartphone with video capability that we can use for your visit?" a. If yes - list this number in appt notes as cell (if different from BEST phone #) and list the appointment type as a VIDEO visit in appointment notes b. If no - list the appointment type as a PHONE visit in appointment notes  3. Confirm consent - "In the setting of the current Covid19 crisis, you are scheduled for a (phone or video) visit with your provider on (date) at (time).  Just as we do with many in-office visits, in order for you to participate in this visit, we must obtain consent.  If you'd like, I can send this to your mychart (if signed up) or email for you to review.  Otherwise, I can obtain your verbal consent now.  All virtual visits are billed to your insurance company just like a normal visit would be.  By agreeing to a virtual visit, we'd like you to understand that the technology does not allow for your provider to perform an examination, and thus may limit your provider's ability to fully assess your condition. If your provider identifies any concerns that need to be evaluated in person, we will make arrangements to do so.  Finally, though the technology is pretty good, we cannot assure that it will always work on either your or our end, and in the setting of a video visit, we may have to convert it to a phone-only visit.  In either situation, we cannot ensure that we have a secure connection.  Are you willing to proceed?" STAFF: Did the patient verbally acknowledge consent to telehealth visit? Document  YES/NO here: Yes  4. Advise patient to be prepared - "Two hours prior to your appointment, go ahead and check your blood pressure, pulse, oxygen saturation, and your weight (if you have the equipment to check those) and write them all down. When your visit starts, your provider will ask you for this information. If you have an Apple Watch or Kardia device, please plan to have heart rate information ready on the day of your appointment. Please have a pen and paper handy nearby the day of the visit as well."  5. Give patient instructions for MyChart download to smartphone OR Doximity/Doxy.me as below if video visit (depending on what platform provider is using)  6. Inform patient they will receive a phone call 15 minutes prior to their appointment time (may be from unknown caller ID) so they should be prepared to answer    TELEPHONE CALL NOTE  Author Jacob Harrison has been deemed a candidate for a follow-up tele-health visit to limit community exposure during the Covid-19 pandemic. I spoke with the patient via phone to ensure availability of phone/video source, confirm preferred email & phone number, and discuss instructions and expectations.  I reminded Jacob Harrison to be prepared with any vital sign and/or heart rhythm information that could potentially be obtained via home monitoring, at the time of his visit. I reminded Jacob Harrison to expect a phone call prior to his visit.  Jacob Harrison 07/29/2018 10:53 AM   INSTRUCTIONS FOR DOWNLOADING THE MYCHART APP TO SMARTPHONE  - The patient must first make sure to have activated MyChart and know their login information - If Apple, go to [employer] and type in MyChart in the search bar and download the app. If Android, ask patient to go to [employer] and type in [place] in the search bar and download the app. The app is free but as with any other app downloads, their phone may require them to verify saved payment information or Apple/Android  password.  - The patient will need to then log into the app with their MyChart username and password, and select [facility] as their healthcare provider to link the account. When it is time for your visit, go to the MyChart app, find appointments, and click Begin Video Visit. Be sure to Select Allow for your device to access the Microphone and Camera for your visit. You will then be connected, and your provider will be with you shortly.  **If they have any issues connecting, or need assistance please contact MyChart service desk [phone])**  **If using a computer, in order to ensure the best quality for their visit they will need to use either of the following Internet Browsers: [employer], or Google Chrome**  IF USING DOXIMITY or DOXY.ME - The patient will receive a link just prior to their visit by text.     FULL LENGTH CONSENT FOR TELE-HEALTH VISIT   I hereby voluntarily request, consent and authorize [facility] and its employed or contracted physicians, physician assistants, nurse practitioners or other licensed health care professionals (the Practitioner), to provide me with telemedicine health care services (the Services") as deemed necessary by the treating Practitioner. I acknowledge and consent to receive the Services by the Practitioner via telemedicine. I understand that the telemedicine visit will involve communicating with the Practitioner through live audiovisual communication technology and the disclosure of certain medical information by electronic transmission. I acknowledge that I have been given the opportunity to request an in-person assessment or other available alternative prior to the telemedicine visit and am voluntarily participating in the telemedicine visit.  I understand that I have the right to withhold or withdraw my consent to the use of telemedicine in the course of my care at any time, without affecting my right to future care or treatment,  and that the Practitioner or I may terminate the telemedicine visit at any time. I understand that I have the right to inspect all information obtained and/or recorded in the course of the telemedicine visit and may receive copies of available information for a reasonable fee.  I understand that some of the potential risks of receiving the Services via telemedicine include:   Delay or interruption in medical evaluation due to technological equipment failure or disruption;  Information transmitted may not be sufficient (e.g. poor resolution of images) to allow for appropriate medical decision making by the Practitioner; and/or   In rare instances, security protocols could fail, causing a breach of personal health information.  Furthermore, I acknowledge that it is my responsibility to provide information about my medical history, conditions and care that is complete and accurate to the best of my ability. I acknowledge that Practitioner's advice, recommendations, and/or decision may be based on factors not within their control, such as incomplete or inaccurate data provided by me or distortions of diagnostic images or specimens that may result from electronic transmissions. I understand that the practice  of medicine is not an Chief Strategy Officer and that Practitioner makes no warranties or guarantees regarding treatment outcomes. I acknowledge that I will receive a copy of this consent concurrently upon execution via email to the email address I last provided but may also request a printed copy by calling the office of [facility].    I understand that my insurance will be billed for this visit.   I have read or had this consent read to me.  I understand the contents of this consent, which adequately explains the benefits and risks of the Services being provided via telemedicine.   I have been provided ample opportunity to ask questions regarding this consent and the Services and have had my questions  answered to my satisfaction.  I give my informed consent for the services to be provided through the use of telemedicine in my medical care  By participating in this telemedicine visit I agree to the above.

## 2018-07-30 NOTE — Progress Notes (Signed)
Virtual Visit via Video Note   This visit type was conducted due to national recommendations for restrictions regarding the COVID-19 Pandemic (e.g. social distancing) in an effort to limit this patient's exposure and mitigate transmission in our community.  Due to his co-morbid illnesses, this patient is at least at moderate risk for complications without adequate follow up.  This format is felt to be most appropriate for this patient at this time.  All issues noted in this document were discussed and addressed.  A limited physical exam was performed with this format.  Please refer to the patient's chart for his consent to telehealth for [facility].   Evaluation Performed:  Follow-up visit  Date:  08/02/2018   ID:  Jacob Harrison, DOB 02/04/1941, MRN [id number]  Patient Location: Home Provider Location: Home  PCP:  Esaw Grandchild, NP  Cardiologist:  Nelva Bush, MD  Electrophysiologist:  None   Chief Complaint: Leg numbness and weakness  History of Present Illness:    Jacob Harrison is a 78 y.o. 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  He was last seen in our office by Ignacia Bayley, NP in 10/2017.  At that time, he was doing well without chest pain or dyspnea, attributing much of his symptomatic improvement to ranolazine.  His main limiting factor was pain and paresthesias involving both legs in the setting of spinal stenosis.  No medication changes or additional testing were pursued at that time.  Today, Jacob Harrison reports that his back problems have progressed.  He now has considerable weakness paresthesias in both legs.  MRI of the back showed severe spinal stenosis for which he has been receiving injections.  Given continued symptoms, he is contemplating spine surgery but will need cardiac clearance.  Jacob Harrison has noted a few episodes of chest pain over the last 2 to 3  months.  He describes it as a tightness comes on at rest.  It resolves promptly with a single sublingual nitroglycerin he denies associated symptoms.  He has not had any palpitations, lightheadedness, or edema.  He is tolerating his current medications well, including carvedilol and ranolazine.  He reports that he was intolerant of isosorbide mononitrate in the past due to severe headaches.  Blood pressure has been well controlled, similar to today's reading.  At times, his pulse will dip into the low 50s, albeit without symptoms.  He has not had any significant bleeding or falls.  The patient does not have symptoms concerning for COVID-19 infection (fever, chills, cough, or new shortness of breath).    Past Medical History:  Diagnosis Date   (HFpEF) heart failure with preserved ejection fraction ([place])    a. 10/2016 Echo: EF 65-70%, mild LVH, mildly dil LA. RV fxn nl.   BPH (benign prostatic hyperplasia)    Coronary artery disease    a. 2001 CABG x 3 [facility]): LIMA->LAD, VG->LCX, VG->RPDA; b. 04/05/2010 Cath ([place]): LM 45m, LAD 126m, LCX 46m, RCA 100p, LIMA->LAD atretic, VG->LCX 100, VG->RCA 80ost, 70d; c. 04/2010 Redo CABG x 2 ([place]): VG->LAD, VG->RPDA; d. 10/2012 Cath [facility]): LM 20-30, LAD 100/95-20m, LCX 40-77m, RCA 100p, 80-100d, RPDA 99, RPL1 95, VG->LAD ok, VG->RPDA ok, EF 67%; e. 11/2016 MV: EF 48%, No ischemia.   Diabetes mellitus without complication ([place])    Hyperlipidemia    Scoliosis    Spinal stenosis    Past Surgical History:  Procedure Laterality  Date   CORONARY ARTERY BYPASS GRAFT     x 2 (2001 and redo in late 2011 or early 2012)   HERNIA REPAIR Left    inguinal   TONSILLECTOMY AND ADENOIDECTOMY     TRANSURETHRAL RESECTION OF PROSTATE  1990     Current Meds  Medication Sig   aspirin EC 81 MG tablet Take 1 tablet (81 mg total) by mouth daily.   carvedilol (COREG) 6.25 MG tablet Take 1 tablet (6.25 mg total) by mouth 2 (two) times daily.    Cholecalciferol (VITAMIN D-3 PO) Take 800 Units by mouth daily.   Coenzyme Q10 (CO Q-10) 200 MG CAPS Take 1 capsule by mouth daily.   cyanocobalamin 500 MCG tablet Take 500 mcg by mouth daily.   ezetimibe (ZETIA) 10 MG tablet Take 1 tablet by mouth once daily   finasteride (PROSCAR) 5 MG tablet TAKE 1 TABLET BY MOUTH ONCE DAILY   Glucosamine-Chondroit-Vit C-Mn (GLUCOSAMINE 1500 COMPLEX) CAPS Take 1 capsule by mouth daily.   Lancets (ONETOUCH ULTRASOFT) lancets Use to check blood sugars fasting daily   metFORMIN (GLUCOPHAGE) 500 MG tablet Take 1 tablet (500 mg total) by mouth daily.   nitroGLYCERIN (NITROSTAT) 0.4 MG SL tablet Place 1 tablet (0.4 mg total) under the tongue as needed.   ranolazine (RANEXA) 1000 MG SR tablet TAKE 1  BY MOUTH TWICE DAILY   rosuvastatin (CRESTOR) 20 MG tablet TAKE 1 TABLET BY MOUTH AT BEDTIME   [DISCONTINUED] nitroGLYCERIN (NITROSTAT) 0.4 MG SL tablet Place 1 tablet under the tongue as needed.   Current Facility-Administered Medications for the 08/02/18 encounter (Telemedicine) with Jacob Harrison, Harrell Gave, MD  Medication   ipratropium-albuterol (DUONEB) 0.5-2.5 (3) MG/3ML nebulizer solution 3 mL     Allergies:   Tetracyclines & related   Social History   Tobacco Use   Smoking status: Never Smoker   Smokeless tobacco: Never Used  Substance Use Topics   Alcohol use: Yes    Alcohol/week: 11.0 standard drinks    Types: 5 Glasses of wine, 6 Cans of beer per week   Drug use: No     Family Hx: The patient's family history includes Alcohol abuse in his maternal uncle; Cancer in his father and paternal uncle; Diabetes in his maternal uncle and mother; Hyperlipidemia in his maternal uncle and mother.  ROS:   Please see the history of present illness.   All other systems reviewed and are negative.   Prior CV studies:   The following studies were reviewed today:  Pharmacologic MPI (11/06/2016): Low risk study without ischemia or scar.  LVEF 48% though  normal by visual estimation.  TTE (10/21/2016): Normal LV size with mild LVH.  Focal basal hypertrophy noted.  LVEF 65 to 70% with normal diastolic function.  Mild left atrial enlargement.  Normal RV size and function.  Labs/Other Tests and Data Reviewed:    EKG:  No ECG reviewed.  Recent Labs: No results found for requested labs within last 8760 hours.   Recent Lipid Panel Lab Results  Component Value Date/Time   CHOL 144 07/30/2017 09:20 AM   TRIG 176 (H) 07/30/2017 09:20 AM   HDL 44 07/30/2017 09:20 AM   CHOLHDL 3.3 07/30/2017 09:20 AM   CHOLHDL 2.7 11/14/2016 09:11 AM   LDLCALC 65 07/30/2017 09:20 AM    Wt Readings from Last 3 Encounters:  08/02/18 201 lb (91.2 kg)  06/15/18 200 lb (90.7 kg)  05/19/18 201 lb 8 oz (91.4 kg)     Objective:  Vital Signs:  BP 122/60 (BP Location: Left Arm, Patient Position: Sitting, Cuff Size: Normal)    Pulse (!) 58    Ht 5\' 7"  (1.702 m)    Wt 201 lb (91.2 kg)    BMI 31.48 kg/m    VITAL SIGNS:  reviewed GEN:  no acute distress  ASSESSMENT & PLAN:    Coronary artery disease with accelerating angina: Mr. Rickel had been doing well from a heart standpoint with stable angina on ranolazine and carvedilol.  However, over the last 2 to 3 months, he has experienced a few episodes of chest tightness responding to a single sublingual nitroglycerin.  He is minimally active at this time.  He notes that his prior anginal equivalent was dyspnea on exertion.  He is unable to exert himself to the point of testing this.  I recommended that we obtain a pharmacologic myocardial perfusion stress test for further evaluation.  I will not alter his antianginal therapy today, as he has been intolerant of isosorbide mononitrate in the past.  I am also reluctant to escalate carvedilol in the setting of low resting heart rate.  Low-dose amlodipine would be an alternative in the future.  As long as he does not have high risk ischemia, I favor managing him  medically.  Hyperlipidemia: Most recent LDL a year ago was at goal (65).  Continue rosuvastatin and ezetimibe.  Plan to repeat fasting lipid panel in the next few months 1 COVID-19 precautions have been eased.  Chronic HFpEF: No significant exertional dyspnea reported, though mobility is very limited by spinal stenosis.  Continue current medications.  Spinal stenosis and preoperative cardiovascular risk assessment: Jacob Harrison has worsening back pain with bilateral lower extremity weakness/paresthesias.  He is contemplating lumbar spine surgery.  Given that he has an extensive cardiac history and now reports a few episodes of chest tightness, I feel that it is prudent to exclude significant ischemia before moving forward with elective surgery.  Further recommendations will be provided after completion of the myocardial perfusion stress test.  COVID-19 Education: The signs and symptoms of COVID-19 were discussed with the patient and how to seek care for testing (follow up with PCP or arrange E-visit).  The importance of social distancing was discussed today.  Time:   Today, I have spent 12 minutes with the patient with telehealth technology discussing the above problems.  An additional 10 minutes were spent reviewing the patient's chart and documenting today's encounter.   Medication Adjustments/Labs and Tests Ordered: Current medicines are reviewed at length with the patient today.  Concerns regarding medicines are outlined above.   Tests Ordered: Pharmacologic myocardial perfusion stress test.  Medication Changes: Meds ordered this encounter  Medications   nitroGLYCERIN (NITROSTAT) 0.4 MG SL tablet    Sig: Place 1 tablet (0.4 mg total) under the tongue as needed.    Dispense:  25 tablet    Refill:  3    Disposition:  Follow up in 1 month(s)  Signed, Nelva Bush, MD  08/02/2018 10:18 AM    [place]

## 2018-08-02 ENCOUNTER — Other Ambulatory Visit: Payer: Self-pay

## 2018-08-02 ENCOUNTER — Encounter: Payer: Self-pay | Admitting: Internal Medicine

## 2018-08-02 ENCOUNTER — Telehealth (INDEPENDENT_AMBULATORY_CARE_PROVIDER_SITE_OTHER): Payer: Medicare Other | Admitting: Internal Medicine

## 2018-08-02 VITALS — BP 122/60 | HR 58 | Ht 67.0 in | Wt 201.0 lb

## 2018-08-02 DIAGNOSIS — Z01810 Encounter for preprocedural cardiovascular examination: Secondary | ICD-10-CM

## 2018-08-02 DIAGNOSIS — I25110 Atherosclerotic heart disease of native coronary artery with unstable angina pectoris: Secondary | ICD-10-CM

## 2018-08-02 DIAGNOSIS — M48061 Spinal stenosis, lumbar region without neurogenic claudication: Secondary | ICD-10-CM

## 2018-08-02 DIAGNOSIS — E785 Hyperlipidemia, unspecified: Secondary | ICD-10-CM

## 2018-08-02 DIAGNOSIS — I5032 Chronic diastolic (congestive) heart failure: Secondary | ICD-10-CM

## 2018-08-02 MED ORDER — NITROGLYCERIN 0.4 MG SL SUBL: 0.4000 mg | SUBLINGUAL_TABLET | SUBLINGUAL | 3 refills | Status: DC | PRN

## 2018-08-02 NOTE — Patient Instructions (Addendum)
Medication Instructions:  - Your physician recommends that you continue on your current medications as directed. Please refer to the Current Medication list given to you today.  If you need a refill on your cardiac medications before your next appointment, please call your pharmacy.    Lab work: - none ordered  If you have labs (blood work) drawn today and your tests are completely normal, you will receive your results only by: Marland Kitchen MyChart Message (if you have MyChart) OR . A paper copy in the mail If you have any lab test that is abnormal or we need to change your treatment, we will call you to review the results.   Testing/Procedures: - Your physician has requested that you have a lexiscan myoview.   [place]  Your caregiver has ordered a Stress Test with nuclear imaging. The purpose of this test is to evaluate the blood supply to your heart muscle. This procedure is referred to as a "Non-Invasive Stress Test." This is because other than having an IV started in your vein, nothing is inserted or "invades" your body. Cardiac stress tests are done to find areas of poor blood flow to the heart by determining the extent of coronary artery disease (CAD). Some patients exercise on a treadmill, which naturally increases the blood flow to your heart, while others who are  unable to walk on a treadmill due to physical limitations have a pharmacologic/chemical stress agent called Lexiscan . This medicine will mimic walking on a treadmill by temporarily increasing your coronary blood flow.   Please note: these test may take anywhere between 2-4 hours to complete  PLEASE REPORT TO [place] AT THE FIRST DESK WILL DIRECT YOU WHERE TO GO  Date of Procedure:_____________________________________  Arrival Time for Procedure:______________________________  Instructions regarding medication:   __x__ : Hold diabetes medication morning of procedure  (metformin)  __x__:  Hold betablocker(s) night before procedure and morning of procedure (carvedilol)   PLEASE NOTIFY THE OFFICE AT LEAST 24 HOURS IN ADVANCE IF YOU ARE UNABLE TO KEEP YOUR APPOINTMENT.  [phone] AND  PLEASE NOTIFY NUCLEAR MEDICINE AT [facility] AT LEAST 24 HOURS IN ADVANCE IF YOU ARE UNABLE TO KEEP YOUR APPOINTMENT. [phone]  How to prepare for your Myoview test:  1. Do not eat or drink after midnight 2. No caffeine for 24 hours prior to test 3. No smoking 24 hours prior to test. 4. Your medication may be taken with water.  If your doctor stopped a medication because of this test, do not take that medication. 5. Ladies, please do not wear dresses.  Skirts or pants are appropriate. Please wear a short sleeve shirt. 6. No perfume, cologne or lotion. 7. Wear comfortable walking shoes. No heels!     Follow-Up: At [facility], you and your health needs are our priority.  As part of our continuing mission to provide you with exceptional heart care, we have created designated Provider Care Teams.  These Care Teams include your primary Cardiologist (physician) and Advanced Practice Providers (APPs -  Physician Assistants and Nurse Practitioners) who all work together to provide you with the care you need, when you need it. . in 1 month with Dr. Saunders Revel- E-visit  Any Other Special Instructions Will Be Listed Below (If Applicable). - N/A   Cardiac Nuclear Scan A cardiac nuclear scan is a test that measures blood flow to the heart when a person is resting and when he or she is exercising. The test  looks for problems such as:  Not enough blood reaching a portion of the heart.  The heart muscle not working normally. You may need this test if:  You have heart disease.  You have had abnormal lab results.  You have had heart surgery or a balloon procedure to open up blocked arteries (angioplasty).  You have chest pain.  You have shortness of breath. In this test, a  radioactive dye (tracer) is injected into your bloodstream. After the tracer has traveled to your heart, an imaging device is used to measure how much of the tracer is absorbed by or distributed to various areas of your heart. This procedure is usually done at a hospital and takes 2-4 hours. Tell a health care provider about:  Any allergies you have.  All medicines you are taking, including vitamins, herbs, eye drops, creams, and over-the-counter medicines.  Any problems you or family members have had with anesthetic medicines.  Any blood disorders you have.  Any surgeries you have had.  Any medical conditions you have.  Whether you are pregnant or may be pregnant. What are the risks? Generally, this is a safe procedure. However, problems may occur, including:  Serious chest pain and heart attack. This is only a risk if the stress portion of the test is done.  Rapid heartbeat.  Sensation of warmth in your chest. This usually passes quickly.  Allergic reaction to the tracer. What happens before the procedure?  Ask your health care provider about changing or stopping your regular medicines. This is especially important if you are taking diabetes medicines or blood thinners.  Follow instructions from your health care provider about eating or drinking restrictions.  Remove your jewelry on the day of the procedure. What happens during the procedure?  An IV will be inserted into one of your veins.  Your health care provider will inject a small amount of radioactive tracer through the IV.  You will wait for 20-40 minutes while the tracer travels through your bloodstream.  Your heart activity will be monitored with an electrocardiogram (ECG).  You will lie down on an exam table.  Images of your heart will be taken for about 15-20 minutes.  You may also have a stress test. For this test, one of the following may be done: ? You will exercise on a treadmill or stationary bike.  While you exercise, your heart's activity will be monitored with an ECG, and your blood pressure will be checked. ? You will be given medicines that will increase blood flow to parts of your heart. This is done if you are unable to exercise.  When blood flow to your heart has peaked, a tracer will again be injected through the IV.  After 20-40 minutes, you will get back on the exam table and have more images taken of your heart.  Depending on the type of tracer used, scans may need to be repeated 3-4 hours later.  Your IV line will be removed when the procedure is over. The procedure may vary among health care providers and hospitals. What happens after the procedure?  Unless your health care provider tells you otherwise, you may return to your normal schedule, including diet, activities, and medicines.  Unless your health care provider tells you otherwise, you may increase your fluid intake. This will help to flush the contrast dye from your body. Drink enough fluid to keep your urine pale yellow.  Ask your health care provider, or the department that is doing the  test: ? When will my results be ready? ? How will I get my results? Summary  A cardiac nuclear scan measures the blood flow to the heart when a person is resting and when he or she is exercising.  Tell your health care provider if you are pregnant.  Before the procedure, ask your health care provider about changing or stopping your regular medicines. This is especially important if you are taking diabetes medicines or blood thinners.  After the procedure, unless your health care provider tells you otherwise, increase your fluid intake. This will help flush the contrast dye from your body.  After the procedure, unless your health care provider tells you otherwise, you may return to your normal schedule, including diet, activities, and medicines. This information is not intended to replace advice given to you by your health  care provider. Make sure you discuss any questions you have with your health care provider. Document Released: 04/18/2004 Document Revised: 09/07/2017 Document Reviewed: 09/07/2017 Elsevier Interactive Patient Education  2019 [employer].

## 2018-08-05 ENCOUNTER — Other Ambulatory Visit: Payer: Self-pay

## 2018-08-05 ENCOUNTER — Encounter
Admission: RE | Admit: 2018-08-05 | Discharge: 2018-08-05 | Disposition: A | Discharge status: Home / Self Care | Payer: Medicare Other | Source: Ambulatory Visit | Attending: Internal Medicine | Admitting: Internal Medicine

## 2018-08-05 DIAGNOSIS — I25110 Atherosclerotic heart disease of native coronary artery with unstable angina pectoris: Secondary | ICD-10-CM | POA: Diagnosis present

## 2018-08-05 MED ORDER — TECHNETIUM TC 99M TETROFOSMIN IV KIT
10.0000 | PACK | Freq: Once | INTRAVENOUS | Status: AC | PRN
  Administered 2018-08-05: 11.47 via INTRAVENOUS

## 2018-08-05 MED ORDER — REGADENOSON 0.4 MG/5ML IV SOLN
0.4000 mg | Freq: Once | INTRAVENOUS | Status: AC
  Administered 2018-08-05: 0.4 mg via INTRAVENOUS

## 2018-08-05 MED ORDER — TECHNETIUM TC 99M TETROFOSMIN IV KIT
31.8200 | PACK | Freq: Once | INTRAVENOUS | Status: AC | PRN
  Administered 2018-08-05: 31.82 via INTRAVENOUS

## 2018-08-06 ENCOUNTER — Telehealth: Payer: Self-pay | Admitting: Internal Medicine

## 2018-08-06 LAB — NM MYOCAR MULTI W/SPECT W/WALL MOTION / EF
Estimated workload: 1 METS
Exercise duration (min): 0 min
Exercise duration (sec): 0 s
LV dias vol: 92 mL (ref 62–150)
LV sys vol: 41 mL
MPHR: 145 {beats}/min
Peak HR: 78 {beats}/min
Percent HR: 53 %
Rest HR: 45 {beats}/min
SDS: 8
SRS: 0
SSS: 9
TID: 1.35

## 2018-08-06 MED ORDER — AMLODIPINE BESYLATE 2.5 MG PO TABS: 2.5000 mg | ORAL_TABLET | Freq: Every day | ORAL | 11 refills | Status: DC

## 2018-08-06 NOTE — Telephone Encounter (Signed)
Results of myocardial perfusion stress test were discussed with the patient.  Official report indicates low risk study with some diaphragmatic attenuation.  I have personally reviewed the images and feel that there is moderate in size, moderate in severity, nearly completely reversible mid anterolateral and inferolateral defect suggestive of ischemia in the LCx territory.  Overall, Jacob Harrison reports stable symptoms.  He has needed sublingual nitroglycerin about 2-3 times in the last month.  We discussed optimization of medical therapy versus cardiac catheterization and have agreed to the former.  As he was intolerant of isosorbide mononitrate in the past due to headaches, we will try amlodipine 2.5 mg daily.  He should continue his current doses of carvedilol and ranolazine.  Jacob Harrison is scheduled for follow-up with Christell Faith, PA, on 09/02/2018.  His symptoms should be reassessed at that time.  If his chest pain has improved with medical therapy, I think it would be reasonable for him to proceed with back surgery.  If he has persistent or worsening symptoms, I would favor cardiac catheterization at that point.  Nelva Bush, MD [facility] Pager: [phone]

## 2018-08-10 ENCOUNTER — Other Ambulatory Visit: Payer: Self-pay

## 2018-08-10 ENCOUNTER — Other Ambulatory Visit (INDEPENDENT_AMBULATORY_CARE_PROVIDER_SITE_OTHER): Payer: Medicare Other

## 2018-08-10 DIAGNOSIS — Z0000 Encounter for general adult medical examination without abnormal findings: Secondary | ICD-10-CM

## 2018-08-10 DIAGNOSIS — E119 Type 2 diabetes mellitus without complications: Secondary | ICD-10-CM

## 2018-08-10 DIAGNOSIS — I10 Essential (primary) hypertension: Secondary | ICD-10-CM

## 2018-08-10 DIAGNOSIS — E785 Hyperlipidemia, unspecified: Secondary | ICD-10-CM

## 2018-08-10 DIAGNOSIS — E1169 Type 2 diabetes mellitus with other specified complication: Secondary | ICD-10-CM

## 2018-08-11 LAB — CBC WITH DIFFERENTIAL/PLATELET
Basophils Absolute: 0.1 10*3/uL (ref 0.0–0.2)
Basos: 1 %
EOS (ABSOLUTE): 0.2 10*3/uL (ref 0.0–0.4)
Eos: 2 %
Hematocrit: 42.2 % (ref 37.5–51.0)
Hemoglobin: 14.6 g/dL (ref 13.0–17.7)
Immature Grans (Abs): 0 10*3/uL (ref 0.0–0.1)
Immature Granulocytes: 0 %
Lymphocytes Absolute: 2.3 10*3/uL (ref 0.7–3.1)
Lymphs: 27 %
MCH: 31.7 pg (ref 26.6–33.0)
MCHC: 34.6 g/dL (ref 31.5–35.7)
MCV: 92 fL (ref 79–97)
Monocytes Absolute: 0.9 10*3/uL (ref 0.1–0.9)
Monocytes: 10 %
Neutrophils Absolute: 5 10*3/uL (ref 1.4–7.0)
Neutrophils: 60 %
Platelets: 214 10*3/uL (ref 150–450)
RBC: 4.6 x10E6/uL (ref 4.14–5.80)
RDW: 13.4 % (ref 11.6–15.4)
WBC: 8.4 10*3/uL (ref 3.4–10.8)

## 2018-08-11 LAB — TSH: TSH: 1.86 u[IU]/mL (ref 0.450–4.500)

## 2018-08-11 LAB — COMPREHENSIVE METABOLIC PANEL
ALT: 25 IU/L (ref 0–44)
AST: 22 IU/L (ref 0–40)
Albumin/Globulin Ratio: 2.9 — ABNORMAL HIGH (ref 1.2–2.2)
Albumin: 4.7 g/dL (ref 3.7–4.7)
Alkaline Phosphatase: 71 IU/L (ref 39–117)
BUN/Creatinine Ratio: 14 (ref 10–24)
BUN: 9 mg/dL (ref 8–27)
Bilirubin Total: 0.7 mg/dL (ref 0.0–1.2)
CO2: 20 mmol/L (ref 20–29)
Calcium: 9.7 mg/dL (ref 8.6–10.2)
Chloride: 102 mmol/L (ref 96–106)
Creatinine, Ser: 0.65 mg/dL — ABNORMAL LOW (ref 0.76–1.27)
GFR calc Af Amer: 110 mL/min/{1.73_m2} (ref >59)
GFR calc non Af Amer: 95 mL/min/{1.73_m2} (ref >59)
Globulin, Total: 1.6 g/dL (ref 1.5–4.5)
Glucose: 108 mg/dL — ABNORMAL HIGH (ref 65–99)
Potassium: 4.2 mmol/L (ref 3.5–5.2)
Sodium: 138 mmol/L (ref 134–144)
Total Protein: 6.3 g/dL (ref 6.0–8.5)

## 2018-08-11 LAB — LIPID PANEL
Chol/HDL Ratio: 2.9 ratio (ref 0.0–5.0)
Cholesterol, Total: 140 mg/dL (ref 100–199)
HDL: 49 mg/dL (ref >39)
LDL Calculated: 62 mg/dL (ref 0–99)
Triglycerides: 145 mg/dL (ref 0–149)
VLDL Cholesterol Cal: 29 mg/dL (ref 5–40)

## 2018-08-11 LAB — HEMOGLOBIN A1C
Est. average glucose Bld gHb Est-mCnc: 120 mg/dL
Hgb A1c MFr Bld: 5.8 % — ABNORMAL HIGH (ref 4.8–5.6)

## 2018-08-12 ENCOUNTER — Other Ambulatory Visit: Payer: Self-pay | Admitting: Internal Medicine

## 2018-08-12 ENCOUNTER — Other Ambulatory Visit: Payer: Self-pay | Admitting: Adult Health

## 2018-08-17 ENCOUNTER — Other Ambulatory Visit: Payer: Self-pay

## 2018-08-17 ENCOUNTER — Encounter: Payer: Self-pay | Admitting: Adult Health

## 2018-08-17 ENCOUNTER — Ambulatory Visit (INDEPENDENT_AMBULATORY_CARE_PROVIDER_SITE_OTHER): Payer: Medicare Other | Admitting: Adult Health

## 2018-08-17 VITALS — BP 115/65 | HR 53 | Temp 98.2°F | Ht 67.0 in | Wt 205.0 lb

## 2018-08-17 DIAGNOSIS — Z0000 Encounter for general adult medical examination without abnormal findings: Secondary | ICD-10-CM | POA: Diagnosis not present

## 2018-08-17 NOTE — Assessment & Plan Note (Signed)
Continue current medications as directed Continue to check BP/HR/BG daily Remain as active as tolerated Keep f/u with cardiology 28 May 08/10/2018 Labs: TSH-WNL 1.860 CBC-stable CMP-stable A1c-5.8- great! Lipid Panel- stable LDL at goal- 64 F/u here 4 months COVID-19 Education: Signs and symptoms of COVID-19 infection were discussed with pt and how to seek care for testing.  The importance of following the Stay at Home order, and when out- Social Distancing and wearing a facial mask were discussed today.  I have personally reviewed and noted the following in the patient's chart:   . Medical and social history . Use of alcohol, tobacco or illicit drugs  . Current medications and supplements . Functional ability and status . Nutritional status . Physical activity . Advanced directives . List of other physicians . Hospitalizations, surgeries, and ER visits in previous 12 months . Vitals . Screenings to include cognitive, depression, and falls . Referrals and appointments  In addition, I have reviewed and discussed with patient certain preventive protocols, quality metrics, and best practice recommendations. A written personalized care plan for preventive services as well as general preventive health recommendations were provided to patient.   Esaw Grandchild, NP  08/17/2018

## 2018-08-17 NOTE — Progress Notes (Signed)
Virtual Visit via Telephone Note  I connected with Jacob Harrison on 08/17/18 at  8:15 AM EDT by telephone and verified that I am speaking with the correct person using two identifiers.  Location:  Patient: Home Provider: In Clinic   I discussed the limitations, risks, security and privacy concerns of performing an evaluation and management service by telephone and the availability of in person appointments. I also discussed with the patient that there may be a patient responsible charge related to this service. The patient expressed understanding and agreed to proceed.   Subjective:   Jacob Harrison is a 78 y.o. male who presents for Medicare Annual/Subsequent preventive examination.  Review of Systems: General:   No F/C, wt loss Pulm:   No DIB, SOB, pleuritic chest pain Card:  No CP, palpitations Abd:  No n/v/d or pain Ext:  No inc edema from baseline This patient does not have sx concerning for COVID-19 Infection (ie; fever, chills, cough, new or worsening shortness of breath).    Objective:    Vitals: BP 115/65   Pulse (!) 53   Temp 98.2 F (36.8 C) (Oral)   Ht 5\' 7"  (1.702 m)   Wt 205 lb (93 kg)   BMI 32.11 kg/m   Body mass index is 32.11 kg/m.  Advanced Directives 05/20/2016  Does Patient Have a Medical Advance Directive? Yes  Type of Advance Directive Living will    Tobacco Social History   Tobacco Use  Smoking Status Never Smoker  Smokeless Tobacco Never Used     Counseling given: Not Answered   Past Medical History:  Diagnosis Date  . (HFpEF) heart failure with preserved ejection fraction ([place])    a. 10/2016 Echo: EF 65-70%, mild LVH, mildly dil LA. RV fxn nl.  Marland Kitchen BPH (benign prostatic hyperplasia)   . Coronary artery disease    a. 2001 CABG x 3 [facility]): LIMA->LAD, VG->LCX, VG->RPDA; b. 04/05/2010 Cath ([place]): LM 78m, LAD 132m, LCX 94m, RCA 100p, LIMA->LAD atretic, VG->LCX 100, VG->RCA 80ost, 70d; c. 04/2010 Redo CABG x 2 ([place]): VG->LAD,  VG->RPDA; d. 10/2012 Cath Sharlene [facility]): LM 20-30, LAD 100/95-60m, LCX 40-1m, RCA 100p, 80-100d, RPDA 99, RPL1 95, VG->LAD ok, VG->RPDA ok, EF 67%; e. 11/2016 MV: EF 48%, No ischemia.  . Diabetes mellitus without complication ([place])   . Hyperlipidemia   . Scoliosis   . Spinal stenosis    Past Surgical History:  Procedure Laterality Date  . CORONARY ARTERY BYPASS GRAFT     x 2 (2001 and redo in late 2011 or early 2012)  . HERNIA REPAIR Left    inguinal  . TONSILLECTOMY AND ADENOIDECTOMY    . TRANSURETHRAL RESECTION OF PROSTATE  1990   Family History  Problem Relation Age of Onset  . Diabetes Mother   . Hyperlipidemia Mother   . Cancer Father        colon  . Alcohol abuse Maternal Uncle   . Diabetes Maternal Uncle   . Hyperlipidemia Maternal Uncle   . Cancer Paternal Uncle        colon   Social History   Socioeconomic History  . Marital status: Married    Spouse name: Not on file  . Number of children: Not on file  . Years of education: Not on file  . Highest education level: Not on file  Occupational History  . Not on file  Social Needs  . Financial resource strain: Not on file  . Food insecurity:    Worry: Not on file  Inability: Not on file  . Transportation needs:    Medical: Not on file    Non-medical: Not on file  Tobacco Use  . Smoking status: Never Smoker  . Smokeless tobacco: Never Used  Substance and Sexual Activity  . Alcohol use: Yes    Alcohol/week: 11.0 standard drinks    Types: 5 Glasses of wine, 6 Cans of beer per week  . Drug use: No  . Sexual activity: Not Currently    Birth control/protection: None  Lifestyle  . Physical activity:    Days per week: Not on file    Minutes per session: Not on file  . Stress: Not on file  Relationships  . Social connections:    Talks on phone: Not on file    Gets together: Not on file    Attends religious service: Not on file    Active member of club or organization: Not on file    Attends meetings of clubs or  organizations: Not on file    Relationship status: Not on file  Other Topics Concern  . Not on file  Social History Narrative  . Not on file    Outpatient Encounter Medications as of 08/17/2018  Medication Sig  . amLODipine (NORVASC) 2.5 MG tablet Take 1 tablet (2.5 mg total) by mouth daily.  Marland Kitchen aspirin EC 81 MG tablet Take 1 tablet (81 mg total) by mouth daily.  . carvedilol (COREG) 6.25 MG tablet Take 1 tablet by mouth twice daily  . Cholecalciferol (VITAMIN D-3 PO) Take 800 Units by mouth daily.  . Coenzyme Q10 (CO Q-10) 200 MG CAPS Take 1 capsule by mouth daily.  . cyanocobalamin 500 MCG tablet Take 500 mcg by mouth daily.  Marland Kitchen ezetimibe (ZETIA) 10 MG tablet Take 1 tablet by mouth once daily  . finasteride (PROSCAR) 5 MG tablet Take 1 tablet by mouth once daily  . Glucosamine-Chondroit-Vit C-Mn (GLUCOSAMINE 1500 COMPLEX) CAPS Take 1 capsule by mouth daily.  . Lancets (ONETOUCH ULTRASOFT) lancets Use to check blood sugars fasting daily  . metFORMIN (GLUCOPHAGE) 500 MG tablet Take 1 tablet (500 mg total) by mouth daily.  . nitroGLYCERIN (NITROSTAT) 0.4 MG SL tablet Place 1 tablet (0.4 mg total) under the tongue as needed.  . ranolazine (RANEXA) 1000 MG SR tablet TAKE 1  BY MOUTH TWICE DAILY  . rosuvastatin (CRESTOR) 20 MG tablet TAKE 1 TABLET BY MOUTH AT BEDTIME   Facility-Administered Encounter Medications as of 08/17/2018  Medication  . ipratropium-albuterol (DUONEB) 0.5-2.5 (3) MG/3ML nebulizer solution 3 mL    Activities of Daily Living In your present state of health, do you have any difficulty performing the following activities: 08/17/2018  Hearing? N  Vision? N  Difficulty concentrating or making decisions? N  Walking or climbing stairs? Y  Dressing or bathing? N  Doing errands, shopping? N  Some recent data might be hidden    Patient Care Team: Esaw Grandchild, NP as PCP - General (Family Medicine) End, Harrell Gave, MD as PCP - Cardiology (Cardiology)   Assessment:    This is a routine wellness examination for Jacob Harrison.  Exercise Activities and Dietary recommendations  Limited due to pain r/t spinal stenosis of lumbar region with neurogenic claudication- awaiting cardiac clearance from Dr. Bernita Buffy to proceed with surgical intervention with Dr Yates/Orthopedic Specialist Remains active with house/yard work- as tolerated He consistently follows heart healthy diet, limits CHO/sugar intake  Fall Risk Fall Risk  08/17/2018 06/08/2017 05/20/2016  Falls in the past year? 0 No  Yes  Number falls in past yr: - - 1  Injury with Fall? - - No  Follow up Falls evaluation completed - -   Is the patient's home free of loose throw rugs in walkways, pet beds, electrical cords, etc?   yes      Grab bars in the bathroom? yes      Handrails on the stairs?   yes      Adequate lighting?   yes  Timed Get Up and Go Performed: N/A, encounter not performed in office  Depression Screen PHQ 2/9 Scores 08/17/2018 05/19/2018 05/05/2018 04/27/2018  PHQ - 2 Score 0 0 1 0  PHQ- 9 Score 4 4 10 6     Cognitive Function- WNL     6CIT Screen 08/17/2018  What Year? 0 points  What month? 0 points  What time? 0 points  Count back from 20 0 points  Months in reverse 0 points  Repeat phrase 0 points  Total Score 0    Immunization History  Administered Date(s) Administered  . Influenza, High Dose Seasonal PF 02/25/2017, 04/14/2018  . Influenza-Unspecified 04/08/2016  . Pneumococcal Conjugate-13 04/07/2006  . Pneumococcal Polysaccharide-23 11/27/2016  . Td 04/07/2014    Qualifies for Shingles Vaccine? Yes, pt declined   Screening Tests Health Maintenance  Topic Date Due  . URINE MICROALBUMIN  07/31/2018  . INFLUENZA VACCINE  11/06/2018  . FOOT EXAM  02/04/2019  . HEMOGLOBIN A1C  02/10/2019  . OPHTHALMOLOGY EXAM  04/10/2019  . COLONOSCOPY  01/06/2023  . TETANUS/TDAP  04/07/2024  . PNA vac Low Risk Adult  Completed   Cancer Screenings: Lung: Low Dose CT Chest recommended  if Age 97-80 years, 30 pack-year currently smoking OR have quit w/in 15years. Patient does not qualify. Colorectal: N/A, aged out  Additional Screenings: Hepatitis C Screening:pt declined      Plan:  Continue current medications as directed Continue to check BP/HR/BG daily Remain as active as tolerated Keep f/u with cardiology 28 May 08/10/2018 Labs: TSH-WNL 1.860 CBC-stable CMP-stable A1c-5.8- great! Lipid Panel- stable LDL at goal- 64 F/u here 4 months COVID-19 Education: Signs and symptoms of COVID-19 infection were discussed with pt and how to seek care for testing.  The importance of following the Stay at Home order, and when out- Social Distancing and wearing a facial mask were discussed today.  I have personally reviewed and noted the following in the patient's chart:   . Medical and social history . Use of alcohol, tobacco or illicit drugs  . Current medications and supplements . Functional ability and status . Nutritional status . Physical activity . Advanced directives . List of other physicians . Hospitalizations, surgeries, and ER visits in previous 12 months . Vitals . Screenings to include cognitive, depression, and falls . Referrals and appointments  In addition, I have reviewed and discussed with patient certain preventive protocols, quality metrics, and best practice recommendations. A written personalized care plan for preventive services as well as general preventive health recommendations were provided to patient.   Esaw Grandchild, NP  08/17/2018

## 2018-09-01 NOTE — Progress Notes (Signed)
Virtual Visit via Video Note   This visit type was conducted due to national recommendations for restrictions regarding the COVID-19 Pandemic (e.g. social distancing) in an effort to limit this patient's exposure and mitigate transmission in our community.  Due to his co-morbid illnesses, this patient is at least at moderate risk for complications without adequate follow up.  This format is felt to be most appropriate for this patient at this time.  All issues noted in this document were discussed and addressed.  A limited physical exam was performed with this format.  Please refer to the patient's chart for his consent to telehealth for [facility].   Date:  09/02/2018   ID:  Jacob Harrison, DOB 1940-06-20, MRN [id number]  Patient Location: Home Provider Location: Home  PCP:  Esaw Grandchild, NP  Cardiologist:  Nelva Bush, MD  Electrophysiologist:  None   Evaluation Performed:  Follow-Up Visit  Chief Complaint:  Follow up chest pain  History of Present Illness:    Jacob Harrison is a 78 y.o. male with CAD s/p 3-vessel CABG in 2001 with redo 2-vessel CABG in 04/2010, HFpEF, DM2, HLD, obesity, BPH, and chronic leg pain and numbness in the setting of spinal stenosis who presents for follow up of chest pain.   His last cath in 10/2012 showed native multivessel CAD with patent grafts to the LAD and rPDA. In 11/2016, he underwent stress testing which was low risk. Echo in 10/2016 showed normal LV systolic function with an EF of 93-81%, normal diastolic function, normal RV cavity size and systolic function. In follow up in 10/2017 he noted symptomatic improvement with Ranexa. He was last seen in telehealth follow up in 07/2018 noting progressive weakness in his bilateral legs with MRI of the back showing severe spinal stenosis. He also noted a few episodes of chest pain over the preceding 2-3 months that was described as a tightness that came on with rest and resolved with a single SL NTG. Of  note, he has been intolerant to Imdur secondary to headaches. Due to possible need for spinal surgery given his stenosis and chest tightness, he underwent stress testing in 07/2018 for further risk stratification that suggested a reversible mid anterolateral and inferolateral defect suggestive of ischemia in the LCx territory. Medical therapy was advised with the initiation of amlodipine. Study was read as low risk with an EF of 53%.   Labs: 08/2018 - LDL 62, A1c 5.8, SCr 0.65, K+ 4.2, AST/ALT normal, CBC unremarkable  Patient seen in telehealth follow-up today and has felt quite well from a cardiac perspective since starting amlodipine.  He has not had any further episodes of chest pain/tightness.  His activity level is quite limited secondary to his spinal stenosis with associated lower extremity radiculopathy.  However, he is able to do indoor ADLs as well as go outdoors and enjoy performing some yard work and gardening.  [facility] activity status index indicates he is able to achieve at least 13.45 METs.  Blood pressure runs between the 829H and 371I systolic with heart rates in the upper 50s to mid 60s beats per minute.  He has not had any falls since he was last evaluated.  He indicates he would like to move forward with his back surgery.   The patient does not have symptoms concerning for COVID-19 infection (fever, chills, cough, or new shortness of breath).    Past Medical History:  Diagnosis Date  . (HFpEF) heart failure with preserved ejection fraction ([place])  a. 10/2016 Echo: EF 65-70%, mild LVH, mildly dil LA. RV fxn nl.  Marland Kitchen BPH (benign prostatic hyperplasia)   . Coronary artery disease    a. 2001 CABG x 3 [facility]): LIMA->LAD, VG->LCX, VG->RPDA; b. 04/05/2010 Cath ([place]): LM 41m, LAD 138m, LCX 29m, RCA 100p, LIMA->LAD atretic, VG->LCX 100, VG->RCA 80ost, 70d; c. 04/2010 Redo CABG x 2 ([place]): VG->LAD, VG->RPDA; d. 10/2012 Cath [facility]): LM 20-30, LAD 100/95-21m, LCX 40-72m, RCA 100p,  80-100d, RPDA 99, RPL1 95, VG->LAD ok, VG->RPDA ok, EF 67%; e. 11/2016 MV: EF 48%, No ischemia.  . Diabetes mellitus without complication ([place])   . Hyperlipidemia   . Scoliosis   . Spinal stenosis    Past Surgical History:  Procedure Laterality Date  . CORONARY ARTERY BYPASS GRAFT     x 2 (2001 and redo in late 2011 or early 2012)  . HERNIA REPAIR Left    inguinal  . TONSILLECTOMY AND ADENOIDECTOMY    . TRANSURETHRAL RESECTION OF PROSTATE  1990     Current Meds  Medication Sig  . amLODipine (NORVASC) 2.5 MG tablet Take 1 tablet (2.5 mg total) by mouth daily.  Marland Kitchen aspirin EC 81 MG tablet Take 1 tablet (81 mg total) by mouth daily.  . carvedilol (COREG) 6.25 MG tablet Take 1 tablet by mouth twice daily  . Cholecalciferol (VITAMIN D-3 PO) Take 800 Units by mouth daily.  . Coenzyme Q10 (CO Q-10) 200 MG CAPS Take 1 capsule by mouth daily.  . cyanocobalamin 500 MCG tablet Take 500 mcg by mouth daily.  Marland Kitchen ezetimibe (ZETIA) 10 MG tablet Take 1 tablet by mouth once daily  . finasteride (PROSCAR) 5 MG tablet Take 1 tablet by mouth once daily  . Glucosamine-Chondroit-Vit C-Mn (GLUCOSAMINE 1500 COMPLEX) CAPS Take 1 capsule by mouth daily.  . Lancets (ONETOUCH ULTRASOFT) lancets Use to check blood sugars fasting daily  . metFORMIN (GLUCOPHAGE) 500 MG tablet Take 1 tablet (500 mg total) by mouth daily.  . nitroGLYCERIN (NITROSTAT) 0.4 MG SL tablet Place 1 tablet (0.4 mg total) under the tongue as needed.  . ranolazine (RANEXA) 1000 MG SR tablet TAKE 1  BY MOUTH TWICE DAILY  . rosuvastatin (CRESTOR) 20 MG tablet TAKE 1 TABLET BY MOUTH AT BEDTIME   Current Facility-Administered Medications for the 09/02/18 encounter (Telemedicine) with Rise Mu, PA-C  Medication  . ipratropium-albuterol (DUONEB) 0.5-2.5 (3) MG/3ML nebulizer solution 3 mL     Allergies:   Tetracyclines & related   Social History   Tobacco Use  . Smoking status: Never Smoker  . Smokeless tobacco: Never Used  Substance Use  Topics  . Alcohol use: Yes    Alcohol/week: 11.0 standard drinks    Types: 5 Glasses of wine, 6 Cans of beer per week  . Drug use: No     Family Hx: The patient's family history includes Alcohol abuse in his maternal uncle; Cancer in his father and paternal uncle; Diabetes in his maternal uncle and mother; Hyperlipidemia in his maternal uncle and mother.  ROS:   Please see the history of present illness.     All other systems reviewed and are negative.   Prior CV studies:   The following studies were reviewed today:  As above  Labs/Other Tests and Data Reviewed:    EKG:  No ECG reviewed.  Recent Labs: 08/10/2018: ALT 25; BUN 9; Creatinine, Ser 0.65; Hemoglobin 14.6; Platelets 214; Potassium 4.2; Sodium 138; TSH 1.860   Recent Lipid Panel Lab Results  Component Value Date/Time  CHOL 140 08/10/2018 08:57 AM   TRIG 145 08/10/2018 08:57 AM   HDL 49 08/10/2018 08:57 AM   CHOLHDL 2.9 08/10/2018 08:57 AM   CHOLHDL 2.7 11/14/2016 09:11 AM   LDLCALC 62 08/10/2018 08:57 AM    Wt Readings from Last 3 Encounters:  09/02/18 205 lb (93 kg)  08/17/18 205 lb (93 kg)  08/02/18 201 lb (91.2 kg)     Objective:    Vital Signs:  BP 135/64   Pulse 69   Temp 98.4 F (36.9 C) (Oral)   Ht 5\' 7"  (1.702 m)   Wt 205 lb (93 kg)   BMI 32.11 kg/m    VITAL SIGNS:  reviewed GEN:  no acute distress EYES:  sclerae anicteric, EOMI - Extraocular Movements Intact  ASSESSMENT & PLAN:    1. Preoperative cardiac evaluation: Recent stress test read as low risk with primary cardiologist review of images indicating possible defect suggestive of ischemia in the LCx territory.  With addition of amlodipine patient has been asymptomatic.  He is able to achieve greater than 4 METs at baseline.  After review of primary cardiologist documentation, given the patient is asymptomatic with optimization of medical therapy, he is felt to be acceptable risk for noncardiac surgery with revised cardiac index  indicating the patient is low risk with a 0.9% estimated rate of MI, pulmonary edema, VF, cardiac arrest, or complete heart block in the perioperative timeframe.  No further cardiac work-up is needed at this time.  Ideally, we would like for the patient to remain on low-dose aspirin though given the location of his surgery we understand that this may need to be held and will defer this decision to his surgeon.  2. CAD status post CABG status post redo CABG as outlined above: Chest pain has resolved with addition of amlodipine.  He is intolerant to isosorbide mononitrate secondary to headache.  He will remain on carvedilol, amlodipine, and ranolazine in addition to aspirin.  Continue aggressive risk factor modification and secondary prevention.  Given resolution of symptoms with escalation of antianginal therapy no further ischemic testing is needed at this time.  3. HFpEF: He denies any symptoms of volume overload or decompensation.  His mobility is limited secondary to spinal stenosis though at rest he appears to be asymptomatic.  Continue current medications.  4. Hyperlipidemia: Most recent LDL approximately 1 year ago was at goal.  Continue Crestor and Zetia.  Following lessening of social distancing recommendations with COVID-19 a fasting lipid panel will need to be completed at his next visit.  COVID-19 Education: The signs and symptoms of COVID-19 were discussed with the patient and how to seek care for testing (follow up with PCP or arrange E-visit).  The importance of social distancing was discussed today.  Time:   Today, I have spent 12 minutes with the patient with telehealth technology discussing the above problems.     Medication Adjustments/Labs and Tests Ordered: Current medicines are reviewed at length with the patient today.  Concerns regarding medicines are outlined above.   Tests Ordered: No orders of the defined types were placed in this encounter.   Medication Changes: No  orders of the defined types were placed in this encounter.   Disposition:  Follow up in 6 month(s)  Signed, Christell Faith, PA-C  09/02/2018 9:22 AM    [facility]

## 2018-09-02 ENCOUNTER — Telehealth (INDEPENDENT_AMBULATORY_CARE_PROVIDER_SITE_OTHER): Payer: Medicare Other | Admitting: Physician Assistant

## 2018-09-02 ENCOUNTER — Other Ambulatory Visit: Payer: Self-pay

## 2018-09-02 ENCOUNTER — Encounter: Payer: Self-pay | Admitting: Physician Assistant

## 2018-09-02 ENCOUNTER — Other Ambulatory Visit: Payer: Self-pay | Admitting: Adult Health

## 2018-09-02 VITALS — BP 135/64 | HR 69 | Temp 98.4°F | Ht 67.0 in | Wt 205.0 lb

## 2018-09-02 DIAGNOSIS — Z01810 Encounter for preprocedural cardiovascular examination: Secondary | ICD-10-CM | POA: Diagnosis not present

## 2018-09-02 DIAGNOSIS — Z951 Presence of aortocoronary bypass graft: Secondary | ICD-10-CM

## 2018-09-02 DIAGNOSIS — I2510 Atherosclerotic heart disease of native coronary artery without angina pectoris: Secondary | ICD-10-CM

## 2018-09-02 DIAGNOSIS — E785 Hyperlipidemia, unspecified: Secondary | ICD-10-CM | POA: Diagnosis not present

## 2018-09-02 DIAGNOSIS — M48061 Spinal stenosis, lumbar region without neurogenic claudication: Secondary | ICD-10-CM

## 2018-09-02 DIAGNOSIS — E782 Mixed hyperlipidemia: Secondary | ICD-10-CM

## 2018-09-02 DIAGNOSIS — I5030 Unspecified diastolic (congestive) heart failure: Secondary | ICD-10-CM | POA: Diagnosis not present

## 2018-09-02 DIAGNOSIS — I5032 Chronic diastolic (congestive) heart failure: Secondary | ICD-10-CM

## 2018-09-02 NOTE — Patient Instructions (Signed)
It was a pleasure to speak with you on the phone today! Thank you for allowing [place] to continue taking care of your [facility] needs during this time.   Feel free to call as needed for questions and concerns related to your cardiac needs.   Medication Instructions:  Your physician recommends that you continue on your current medications as directed. Please refer to the Current Medication list given to you today.  If you need a refill on your cardiac medications before your next appointment, please call your pharmacy.   Lab work: None ordered If you have labs (blood work) drawn today and your tests are completely normal, you will receive your results only by: Marland Kitchen MyChart Message (if you have MyChart) OR . A paper copy in the mail If you have any lab test that is abnormal or we need to change your treatment, we will call you to review the results.  Testing/Procedures: None ordered  Follow-Up: At [facility], you and your health needs are our priority.  As part of our continuing mission to provide you with exceptional heart care, we have created designated Provider Care Teams.  These Care Teams include your primary Cardiologist (physician) and Advanced Practice Providers (APPs -  Physician Assistants and Nurse Practitioners) who all work together to provide you with the care you need, when you need it. You will need a follow up appointment in 6 months.  Please see Nelva Bush, MD   Any Other Special Instructions Will Be Listed Below (If Applicable). We will fax surgical clearance letter to Dr. Lorin Mercy with [facility].

## 2018-09-07 ENCOUNTER — Telehealth: Payer: Self-pay | Admitting: Orthopaedic Surgery

## 2018-09-07 NOTE — Telephone Encounter (Signed)
Pt called in wanting to speak to debbie and let her know that the heart care team has cleared him for surgery, I have gave him a call back and let him know that as of right now debbie is out of the office but as soon as she returns or we know something he'll receive a call.  [phone]

## 2018-09-09 NOTE — Telephone Encounter (Signed)
Please see below.

## 2018-09-14 ENCOUNTER — Telehealth: Payer: Self-pay | Admitting: Orthopaedic Surgery

## 2018-09-14 NOTE — Telephone Encounter (Signed)
Patient called stating had surgery scheduled w/[facility] and state he was cleared by Cardiologist.  Wants the scheduler to call @[phone]

## 2018-09-16 ENCOUNTER — Telehealth: Payer: Self-pay | Admitting: Adult Health

## 2018-09-16 MED ORDER — ROSUVASTATIN CALCIUM 20 MG PO TABS: 20.0000 mg | ORAL_TABLET | Freq: Every day | ORAL | 1 refills | Status: DC

## 2018-09-16 NOTE — Telephone Encounter (Signed)
Patient called to request refill on  :   rosuvastatin (CRESTOR) 20 MG tablet [[id number]]   Order Details Dose, Route, Frequency: As Directed  Dispense Quantity: 90 tablet Refills: 1 Fills remaining: --        Sig: TAKE 1 TABLET BY MOUTH AT BEDTIME       --Forwarding message to med asst for Refill order to different [employer] (Now uses)   [employer] [place], [place] [phone] (Phone) [phone] (Fax)   --glh

## 2018-10-06 ENCOUNTER — Other Ambulatory Visit: Payer: Self-pay

## 2018-10-06 ENCOUNTER — Encounter: Payer: Self-pay | Admitting: Surgery

## 2018-10-06 ENCOUNTER — Ambulatory Visit (INDEPENDENT_AMBULATORY_CARE_PROVIDER_SITE_OTHER): Payer: Medicare Other | Admitting: Surgery

## 2018-10-06 VITALS — BP 130/62 | HR 54 | Ht 67.0 in | Wt 200.0 lb

## 2018-10-06 DIAGNOSIS — M48062 Spinal stenosis, lumbar region with neurogenic claudication: Secondary | ICD-10-CM

## 2018-10-06 NOTE — Progress Notes (Signed)
78 year old white male with history of L3-4 and L4-5 stenosis comes in for preop evaluation.  Symptoms unchanged from previous visit.  He is want to proceed with L3-4 and L4-5 decompression as scheduled.  We have received preop cardiac clearance.  Surgical procedure discussed.  All questions answered.

## 2018-10-12 NOTE — Progress Notes (Signed)
[facility] [address], [place] [address] Phone: [phone] Fax: [phone]      Your procedure is scheduled on July 15th.  Report to [facility] Main Entrance "A" at 10:30 A.M., and check in at the Admitting office.  Call this number if you have problems the morning of surgery:  [phone]  Call [phone] if you have any questions prior to your surgery date Monday-Friday 8am-4pm    Remember:  Do not eat after midnight the night before your surgery  You may drink clear liquids until 9:30 AM the morning of your surgery.   Clear liquids allowed are: Water, Non-Citrus Juices (without pulp), Carbonated Beverages, Clear Tea, Black Coffee Only, and Gatorade    Please complete your PRE-SURGERY G2 Gatorade that was provided to you by 9:30 AM the  morning of surgery.  Please, if able, drink it in one setting. DO NOT SIP.    Take these medicines the morning of surgery with A SIP OF WATER   Amlodipine (Norvasc)  Carvedilol (Coreg)  Ezetimibe (Zetia)  Finasteride (Proscar)  Nebulizer treatment - if needed  Nitroglycerin - if needed  Ranolazine (Ranexa)    7 days prior to surgery STOP taking any Aspirin (unless otherwise instructed by your surgeon), Aleve, Naproxen, Ibuprofen, Motrin, Advil, Goody's, BC's, all herbal medications, fish oil, and all vitamins.   WHAT DO I DO ABOUT MY DIABETES MEDICATION?   Marland Kitchen Do not take oral diabetes medicines (pills) the morning of surgery. - Metformin    How to Manage Your Diabetes Before and After Surgery  Why is it important to control my blood sugar before and after surgery? . Improving blood sugar levels before and after surgery helps healing and can limit problems. . A way of improving blood sugar control is eating a healthy diet by: o  Eating less sugar and carbohydrates o  Increasing activity/exercise o  Talking with your doctor about reaching your blood sugar  goals . High blood sugars (greater than 180 mg/dL) can raise your risk of infections and slow your recovery, so you will need to focus on controlling your diabetes during the weeks before surgery. . Make sure that the doctor who takes care of your diabetes knows about your planned surgery including the date and location.  How do I manage my blood sugar before surgery? . Check your blood sugar at least 4 times a day, starting 2 days before surgery, to make sure that the level is not too high or low. o Check your blood sugar the morning of your surgery when you wake up and every 2 hours until you get to the Short Stay unit. . If your blood sugar is less than 70 mg/dL, you will need to treat for low blood sugar: o Do not take insulin. o Treat a low blood sugar (less than 70 mg/dL) with  cup of clear juice (cranberry or apple), 4 glucose tablets, OR glucose gel. o Recheck blood sugar in 15 minutes after treatment (to make sure it is greater than 70 mg/dL). If your blood sugar is not greater than 70 mg/dL on recheck, call [phone] for further instructions. . Report your blood sugar to the short stay nurse when you get to Short Stay.  . If you are admitted to the hospital after surgery: o Your blood sugar will be checked by the staff and you will probably be given insulin after surgery (instead of oral diabetes medicines) to  make sure you have good blood sugar levels. o The goal for blood sugar control after surgery is 80-180 mg/dL.    The Morning of Surgery  Do not wear jewelry.  Do not wear lotions, powders, or perfumes/colognes, or deodorant  Men may shave face and neck.  Do not bring valuables to the hospital.  [facility] is not responsible for any belongings or valuables.  If you are a smoker, DO NOT Smoke 24 hours prior to surgery IF you wear a CPAP at night please bring your mask, tubing, and machine the morning of surgery   Remember that you must have someone to transport you  home after your surgery, and remain with you for 24 hours if you are discharged the same day.   Contacts, glasses, hearing aids, dentures or bridgework may not be worn into surgery.    Leave your suitcase in the car.  After surgery it may be brought to your room.  For patients admitted to the hospital, discharge time will be determined by your treatment team.  Patients discharged the day of surgery will not be allowed to drive home.    Special instructions:   [place]- Preparing For Surgery  Before surgery, you can play an important role. Because skin is not sterile, your skin needs to be as free of germs as possible. You can reduce the number of germs on your skin by washing with CHG (chlorahexidine gluconate) Soap before surgery.  CHG is an antiseptic cleaner which kills germs and bonds with the skin to continue killing germs even after washing.    Oral Hygiene is also important to reduce your risk of infection.  Remember - BRUSH YOUR TEETH THE MORNING OF SURGERY WITH YOUR REGULAR TOOTHPASTE  Please do not use if you have an allergy to CHG or antibacterial soaps. If your skin becomes reddened/irritated stop using the CHG.  Do not shave (including legs and underarms) for at least 48 hours prior to first CHG shower. It is OK to shave your face.  Please follow these instructions carefully.   1. Shower the NIGHT BEFORE SURGERY and the MORNING OF SURGERY with CHG Soap.   2. If you chose to wash your hair, wash your hair first as usual with your normal shampoo.  3. After you shampoo, rinse your hair and body thoroughly to remove the shampoo.  4. Use CHG as you would any other liquid soap. You can apply CHG directly to the skin and wash gently with a scrungie or a clean washcloth.   5. Apply the CHG Soap to your body ONLY FROM THE NECK DOWN.  Do not use on open wounds or open sores. Avoid contact with your eyes, ears, mouth and genitals (private parts). Wash Face and genitals (private  parts)  with your normal soap.   6. Wash thoroughly, paying special attention to the area where your surgery will be performed.  7. Thoroughly rinse your body with warm water from the neck down.  8. DO NOT shower/wash with your normal soap after using and rinsing off the CHG Soap.  9. Pat yourself dry with a CLEAN TOWEL.  10. Wear CLEAN PAJAMAS to bed the night before surgery, wear comfortable clothes the morning of surgery  11. Place CLEAN SHEETS on your bed the night of your first shower and DO NOT SLEEP WITH PETS.   Day of Surgery:  Do not apply any deodorants/lotions. Please shower the morning of surgery with the CHG soap  Please wear  clean clothes to the hospital/surgery center.   Remember to brush your teeth WITH YOUR REGULAR TOOTHPASTE.   Please read over the following fact sheets that you were given.

## 2018-10-13 ENCOUNTER — Encounter (HOSPITAL_COMMUNITY)
Admission: RE | Admit: 2018-10-13 | Discharge: 2018-10-13 | Disposition: A | Discharge status: Home / Self Care | Payer: Medicare Other | Source: Ambulatory Visit | Attending: Orthopaedic Surgery | Admitting: Orthopaedic Surgery

## 2018-10-13 ENCOUNTER — Ambulatory Visit (HOSPITAL_COMMUNITY)
Admission: RE | Admit: 2018-10-13 | Discharge: 2018-10-13 | Disposition: A | Discharge status: Home / Self Care | Payer: Medicare Other | Source: Ambulatory Visit | Attending: Surgery | Admitting: Surgery

## 2018-10-13 ENCOUNTER — Encounter (HOSPITAL_COMMUNITY): Payer: Self-pay

## 2018-10-13 ENCOUNTER — Other Ambulatory Visit: Payer: Self-pay

## 2018-10-13 DIAGNOSIS — Z01818 Encounter for other preprocedural examination: Secondary | ICD-10-CM | POA: Insufficient documentation

## 2018-10-13 DIAGNOSIS — E785 Hyperlipidemia, unspecified: Secondary | ICD-10-CM | POA: Diagnosis not present

## 2018-10-13 DIAGNOSIS — M48061 Spinal stenosis, lumbar region without neurogenic claudication: Secondary | ICD-10-CM | POA: Insufficient documentation

## 2018-10-13 DIAGNOSIS — I10 Essential (primary) hypertension: Secondary | ICD-10-CM | POA: Diagnosis not present

## 2018-10-13 DIAGNOSIS — I2510 Atherosclerotic heart disease of native coronary artery without angina pectoris: Secondary | ICD-10-CM | POA: Insufficient documentation

## 2018-10-13 DIAGNOSIS — Z7982 Long term (current) use of aspirin: Secondary | ICD-10-CM | POA: Insufficient documentation

## 2018-10-13 DIAGNOSIS — Z79899 Other long term (current) drug therapy: Secondary | ICD-10-CM | POA: Diagnosis not present

## 2018-10-13 DIAGNOSIS — Z7984 Long term (current) use of oral hypoglycemic drugs: Secondary | ICD-10-CM | POA: Diagnosis not present

## 2018-10-13 DIAGNOSIS — Z951 Presence of aortocoronary bypass graft: Secondary | ICD-10-CM | POA: Diagnosis not present

## 2018-10-13 DIAGNOSIS — N400 Enlarged prostate without lower urinary tract symptoms: Secondary | ICD-10-CM | POA: Diagnosis not present

## 2018-10-13 DIAGNOSIS — E119 Type 2 diabetes mellitus without complications: Secondary | ICD-10-CM | POA: Diagnosis not present

## 2018-10-13 DIAGNOSIS — M419 Scoliosis, unspecified: Secondary | ICD-10-CM | POA: Insufficient documentation

## 2018-10-13 HISTORY — DX: Unspecified osteoarthritis, unspecified site: M19.90

## 2018-10-13 HISTORY — DX: Essential (primary) hypertension: I10

## 2018-10-13 LAB — COMPREHENSIVE METABOLIC PANEL
ALT: 26 U/L (ref 0–44)
AST: 23 U/L (ref 15–41)
Albumin: 4.5 g/dL (ref 3.5–5.0)
Alkaline Phosphatase: 73 U/L (ref 38–126)
Anion gap: 10 (ref 5–15)
BUN: 9 mg/dL (ref 8–23)
CO2: 25 mmol/L (ref 22–32)
Calcium: 9.7 mg/dL (ref 8.9–10.3)
Chloride: 103 mmol/L (ref 98–111)
Creatinine, Ser: 0.78 mg/dL (ref 0.61–1.24)
GFR calc Af Amer: >60 mL/min (ref >60)
GFR calc non Af Amer: >60 mL/min (ref >60)
Glucose, Bld: 115 mg/dL — ABNORMAL HIGH (ref 70–99)
Potassium: 4.2 mmol/L (ref 3.5–5.1)
Sodium: 138 mmol/L (ref 135–145)
Total Bilirubin: 0.9 mg/dL (ref 0.3–1.2)
Total Protein: 6.9 g/dL (ref 6.5–8.1)

## 2018-10-13 LAB — URINALYSIS, ROUTINE W REFLEX MICROSCOPIC
Bilirubin Urine: NEGATIVE
Glucose, UA: NEGATIVE mg/dL
Hgb urine dipstick: NEGATIVE
Ketones, ur: NEGATIVE mg/dL
Leukocytes,Ua: NEGATIVE
Nitrite: NEGATIVE
Protein, ur: NEGATIVE mg/dL
Specific Gravity, Urine: 1.012 (ref 1.005–1.030)
pH: 5 (ref 5.0–8.0)

## 2018-10-13 LAB — CBC
HCT: 43 % (ref 39.0–52.0)
Hemoglobin: 14.4 g/dL (ref 13.0–17.0)
MCH: 31.3 pg (ref 26.0–34.0)
MCHC: 33.5 g/dL (ref 30.0–36.0)
MCV: 93.5 fL (ref 80.0–100.0)
Platelets: 224 10*3/uL (ref 150–400)
RBC: 4.6 MIL/uL (ref 4.22–5.81)
RDW: 13.7 % (ref 11.5–15.5)
WBC: 9.5 10*3/uL (ref 4.0–10.5)
nRBC: 0 % (ref 0.0–0.2)

## 2018-10-13 LAB — SURGICAL PCR SCREEN
MRSA, PCR: NEGATIVE
Staphylococcus aureus: NEGATIVE

## 2018-10-13 LAB — GLUCOSE, CAPILLARY: Glucose-Capillary: 114 mg/dL — ABNORMAL HIGH (ref 70–99)

## 2018-10-13 NOTE — Progress Notes (Signed)
PCP - NP Danford, K  Cardiologist - Dr. Saunders Revel  Chest x-ray - 10/13/2018  EKG - 10/28/17 (E)  Stress Test - 08/06/2018 (E)  ECHO - 10/22/17 (E)  Cardiac Cath - 10/18/12 (E)  AICD-na PM-na LOOP-na  Sleep Study - Denies CPAP - None  LABS- 10/13/2018: CBC, CMP, UA, PCR 10/16/2018: COVID  ASA- LD-7/14  ERAS- Yes- 1 G2 drink given  HA1C- 08/10/2018: 5.8 (E) Fasting Blood Sugar - 110-130, today 114 Checks Blood Sugar __1___ times a day  Anesthesia- Yes-cardiac history  Pt denies having chest pain, sob, or fever at this time. All instructions explained to the pt, with a verbal understanding of the material. Pt agrees to go over the instructions while at home for a better understanding. Pt also instructed to self quarantine after being tested for COVID-19. The opportunity to ask questions was provided.   Coronavirus Screening  Have you experienced the following symptoms:  Cough yes/no: No Fever (>100.53F)  yes/no: No Runny nose yes/no: No Sore throat yes/no: No Difficulty breathing/shortness of breath  yes/no: No  Have you or a family member traveled in the last 14 days and where? yes/no: No   If the patient indicates "YES" to the above questions, their PAT will be rescheduled to limit the exposure to others and, the surgeon will be notified. THE PATIENT WILL NEED TO BE ASYMPTOMATIC FOR 14 DAYS.   If the patient is not experiencing any of these symptoms, the PAT nurse will instruct them to NOT bring anyone with them to their appointment since they may have these symptoms or traveled as well.   Please remind your patients and families that hospital visitation restrictions are in effect and the importance of the restrictions.

## 2018-10-14 NOTE — Progress Notes (Signed)
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  History includes CAD (s/p 3V CABG 2001 in [place], redo CABG: SVG-LAD, SVG-RPDA 04/2010 in [place]), HFpEF, DM2, HTN, HLD, BPH (s/p TURP 1990), scoliosis, spinal stenosis, never smoker. BMI is consistent with obesity.   Preoperative cardiology risk assessment as outlined by Christell Faith, PA-C at 09/02/18 visit, "Recent stress test read as low risk with primary cardiologist review of images indicating possible defect suggestive of ischemia in the LCx territory.  With addition of amlodipine patient has been asymptomatic.  He is able to achieve greater than 4 METs at baseline.  After review of primary cardiologist documentation, given the patient is asymptomatic with optimization of medical therapy, he is felt to be acceptable risk for noncardiac surgery with revised cardiac index indicating the patient is low risk with a 0.9% estimated rate of MI, pulmonary edema, VF, cardiac arrest, or complete heart block in the perioperative timeframe.  No further cardiac work-up is needed at this time.  Ideally, we would like for the patient to remain on low-dose aspirin though given the location of his surgery we understand that this may need to be held and will defer this decision to his surgeon."  Last ASA 10/19/18 per patient. He is scheduled for presurgical COVID testing on 10/16/18.  If negative and otherwise no acute changes in anticipate he can proceed as planned.    VS: BP 129/62   Pulse 67   Temp 36.9 C   Resp 20   Ht 5\' 7"  (1.702 m)   Wt 92.2 kg   SpO2 95%   BMI 31.84 kg/m    PROVIDERS: Esaw Grandchild, NP is PCP. Established care on 05/20/16. Nelva Bush, MD is  cardiologist. Established care as of 06/19/16 following move from [place], [place]. (Previous cardiology Dr. Arvil Persons with [facility] in [place], [place].) Last visit 09/02/18 with Christell Faith, PA-C. Patent had underwent stress testing 07/2018 due to need for spinal surgery with c/o chest tightness. Test was low risk but with possible ischemia in the LCX territory (versus diaphragmatic attenuation). Medical therapy was recommended, and he was started on amlodipine. At his May follow-up chest symptoms had resolved. He is also on Ranexa.   LABS: Labs reviewed: Acceptable for surgery. A1c 5.8 on 08/10/18. (all labs ordered are listed, but only abnormal results are displayed)  Labs Reviewed  GLUCOSE, CAPILLARY - Abnormal; Notable for the following components:      Result Value   Glucose-Capillary 114 (*)    All other components within normal limits  COMPREHENSIVE METABOLIC PANEL - Abnormal; Notable for the following components:   Glucose, Bld 115 (*)    All other components within normal limits  SURGICAL PCR SCREEN  CBC  URINALYSIS, ROUTINE W REFLEX MICROSCOPIC    IMAGES: CXR 10/13/18: IMPRESSION: Mild cardiomegaly status post median sternotomy and CABG. Unchanged scarring or atelectasis of the left lung base with chronic appearing pleural effusion and/or pleural thickening. No acute appearing airspace opacity.  MRI L-spine 09/22/17: IMPRESSION: - Extensive multilevel lumbar degenerative change. - Mild spinal stenosis L2-3 - Moderately severe spinal stenosis L2-3 with subarticular stenosis left greater than right - Severe spinal stenosis L3-4 with  marked subarticular stenosis bilaterally - Severe spinal stenosis L4-5 with marked subarticular stenosis bilaterally - Large central disc and osteophyte complex with subarticular stenosis bilaterally. Mild displacement of the right S1 nerve root.   EKG: 10/28/17: NSR   CV: Nuclear stress test 08/05/18:  There was no ST segment deviation noted  during stress.  No T wave inversion was noted during stress.  Defect 1: There is a defect present in the basal inferior location. This is likely due to diaphragm attenuation and improved with attenuation correction  The study is normal.  This is a low risk study.  Nuclear stress EF: 53%.   Echo 10/21/16: Study Conclusions - Left ventricle: The cavity size was normal. Wall thickness was   increased in a pattern of mild LVH. There was focal basal   hypertrophy. Systolic function was vigorous. The estimated   ejection fraction was in the range of 65% to 70%. Left   ventricular diastolic function parameters were normal. - Left atrium: The atrium was mildly dilated. - Right ventricle: The cavity size was normal. Systolic function   was normal.  Cardiac cath 10/18/12 [facility]): LM: ostial/proximal 20-30% LAD: mid 100%, 95-99%; distal diffuse disease (small) LCX: mid 40-50% RCA: proximal 100%; distal 100%, 95%, 80-90%; 1st RPL 99%; rPDA 95% GRAFTS: SVG-rPDA patent; SVG-LAD patent LV: EF 67%. Normal wall motion Summary: Multivessel CAD.  2 grafts patent.  Normal LV systolic function. Recommendations: Treat medically.   Past Medical History:  Diagnosis Date  . (HFpEF) heart failure with preserved ejection fraction ([place])    a. 10/2016 Echo: EF 65-70%, mild LVH, mildly dil LA. RV fxn nl.  . Arthritis   . BPH (benign prostatic hyperplasia)   . Coronary artery disease    a. 2001 CABG x 3 [facility]): LIMA->LAD, VG->LCX, VG->RPDA; b. 04/05/2010 Cath ([place]): LM 42m, LAD 175m, LCX 14m, RCA 100p, LIMA->LAD atretic, VG->LCX 100, VG->RCA 80ost, 70d; c. 04/2010 Redo CABG x 2 ([place]): VG->LAD, VG->RPDA; d. 10/2012 Cath [facility]): LM 20-30, LAD 100/95-76m, LCX 40-62m, RCA 100p, 80-100d, RPDA 99, RPL1 95, VG->LAD ok, VG->RPDA ok, EF 67%; e. 11/2016 MV: EF 48%, No ischemia.  . 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 Hyperlipidemia   . Hypertension   . Scoliosis   .  Spinal stenosis     Past Surgical History:  Procedure Laterality Date  . CARDIAC CATHETERIZATION    . CORONARY ARTERY BYPASS GRAFT     x 2 (2001 and redo in late 2011 or early 2012)  . HERNIA REPAIR Left    inguinal  . TONSILLECTOMY AND ADENOIDECTOMY    . TRANSURETHRAL RESECTION OF PROSTATE  1990    MEDICATIONS: . amLODipine (NORVASC) 2.5 MG tablet  . aspirin EC 81 MG tablet  . carvedilol (COREG) 6.25 MG tablet  . Cholecalciferol (VITAMIN D-3 PO)  . Coenzyme Q10 (CO Q-10) 200 MG CAPS  . cyanocobalamin 500 MCG tablet  . ezetimibe (ZETIA) 10 MG tablet  . finasteride (PROSCAR) 5 MG tablet  . Glucosamine-Chondroit-Vit C-Mn (GLUCOSAMINE 1500 COMPLEX) CAPS  . Lancets (ONETOUCH ULTRASOFT) lancets  . metFORMIN (GLUCOPHAGE) 500 MG tablet  . Multiple Vitamins-Minerals (PRESERVISION AREDS 2 PO)  . naproxen sodium (ALEVE) 220 MG tablet  . nitroGLYCERIN (NITROSTAT) 0.4 MG SL tablet  . ranolazine (RANEXA) 1000 MG SR tablet  . rosuvastatin (CRESTOR) 20 MG tablet   . ipratropium-albuterol (DUONEB) 0.5-2.5 (3) MG/3ML nebulizer solution 3 mL    Myra Gianotti, PA-C Surgical Short Stay/Anesthesiology [facility]  Phone [phone] [facility] Phone [phone] 10/14/2018 11:16 AM

## 2018-10-16 ENCOUNTER — Other Ambulatory Visit (HOSPITAL_COMMUNITY)
Admission: RE | Admit: 2018-10-16 | Discharge: 2018-10-16 | Disposition: A | Discharge status: Home / Self Care | Payer: Medicare Other | Source: Ambulatory Visit | Attending: Orthopaedic Surgery | Admitting: Orthopaedic Surgery

## 2018-10-16 DIAGNOSIS — Z1159 Encounter for screening for other viral diseases: Secondary | ICD-10-CM | POA: Diagnosis not present

## 2018-10-16 DIAGNOSIS — Z01812 Encounter for preprocedural laboratory examination: Secondary | ICD-10-CM | POA: Insufficient documentation

## 2018-10-17 LAB — SARS CORONAVIRUS 2 (TAT 6-24 HRS): SARS Coronavirus 2: NEGATIVE

## 2018-10-20 ENCOUNTER — Encounter (HOSPITAL_COMMUNITY): Payer: Self-pay | Admitting: *Deleted

## 2018-10-20 ENCOUNTER — Other Ambulatory Visit: Payer: Self-pay

## 2018-10-20 ENCOUNTER — Inpatient Hospital Stay (HOSPITAL_COMMUNITY)
Admission: RE | Admit: 2018-10-20 | Discharge: 2018-10-22 | DRG: 519 | Disposition: A | Discharge status: Home / Self Care | Payer: Medicare Other | Attending: Orthopaedic Surgery | Admitting: Orthopaedic Surgery

## 2018-10-20 ENCOUNTER — Inpatient Hospital Stay (HOSPITAL_COMMUNITY): Payer: Medicare Other | Admitting: Certified Registered"

## 2018-10-20 ENCOUNTER — Encounter (HOSPITAL_COMMUNITY)
Admission: RE | Disposition: A | Discharge status: Home / Self Care | Payer: Self-pay | Source: Home / Self Care | Attending: Orthopaedic Surgery

## 2018-10-20 ENCOUNTER — Inpatient Hospital Stay (HOSPITAL_COMMUNITY): Payer: Medicare Other | Admitting: Vascular Surgery

## 2018-10-20 ENCOUNTER — Inpatient Hospital Stay (HOSPITAL_COMMUNITY): Payer: Medicare Other

## 2018-10-20 DIAGNOSIS — E1151 Type 2 diabetes mellitus with diabetic peripheral angiopathy without gangrene: Secondary | ICD-10-CM | POA: Diagnosis not present

## 2018-10-20 DIAGNOSIS — E559 Vitamin D deficiency, unspecified: Secondary | ICD-10-CM | POA: Diagnosis present

## 2018-10-20 DIAGNOSIS — Z6831 Body mass index (BMI) 31.0-31.9, adult: Secondary | ICD-10-CM

## 2018-10-20 DIAGNOSIS — Z888 Allergy status to other drugs, medicaments and biological substances status: Secondary | ICD-10-CM | POA: Diagnosis not present

## 2018-10-20 DIAGNOSIS — Z951 Presence of aortocoronary bypass graft: Secondary | ICD-10-CM | POA: Diagnosis not present

## 2018-10-20 DIAGNOSIS — I255 Ischemic cardiomyopathy: Secondary | ICD-10-CM | POA: Diagnosis present

## 2018-10-20 DIAGNOSIS — Z79899 Other long term (current) drug therapy: Secondary | ICD-10-CM

## 2018-10-20 DIAGNOSIS — Z8349 Family history of other endocrine, nutritional and metabolic diseases: Secondary | ICD-10-CM

## 2018-10-20 DIAGNOSIS — M48062 Spinal stenosis, lumbar region with neurogenic claudication: Secondary | ICD-10-CM | POA: Diagnosis not present

## 2018-10-20 DIAGNOSIS — Z800 Family history of malignant neoplasm of digestive organs: Secondary | ICD-10-CM

## 2018-10-20 DIAGNOSIS — E785 Hyperlipidemia, unspecified: Secondary | ICD-10-CM | POA: Diagnosis present

## 2018-10-20 DIAGNOSIS — Y658 Other specified misadventures during surgical and medical care: Secondary | ICD-10-CM | POA: Diagnosis not present

## 2018-10-20 DIAGNOSIS — I2510 Atherosclerotic heart disease of native coronary artery without angina pectoris: Secondary | ICD-10-CM | POA: Diagnosis not present

## 2018-10-20 DIAGNOSIS — Z791 Long term (current) use of non-steroidal anti-inflammatories (NSAID): Secondary | ICD-10-CM

## 2018-10-20 DIAGNOSIS — R059 Cough, unspecified: Secondary | ICD-10-CM

## 2018-10-20 DIAGNOSIS — M5116 Intervertebral disc disorders with radiculopathy, lumbar region: Secondary | ICD-10-CM | POA: Diagnosis present

## 2018-10-20 DIAGNOSIS — M419 Scoliosis, unspecified: Secondary | ICD-10-CM | POA: Diagnosis not present

## 2018-10-20 DIAGNOSIS — I5032 Chronic diastolic (congestive) heart failure: Secondary | ICD-10-CM | POA: Diagnosis not present

## 2018-10-20 DIAGNOSIS — Z419 Encounter for procedure for purposes other than remedying health state, unspecified: Secondary | ICD-10-CM

## 2018-10-20 DIAGNOSIS — N400 Enlarged prostate without lower urinary tract symptoms: Secondary | ICD-10-CM | POA: Diagnosis not present

## 2018-10-20 DIAGNOSIS — I110 Hypertensive heart disease with heart failure: Secondary | ICD-10-CM | POA: Diagnosis not present

## 2018-10-20 DIAGNOSIS — E669 Obesity, unspecified: Secondary | ICD-10-CM | POA: Diagnosis present

## 2018-10-20 DIAGNOSIS — R062 Wheezing: Secondary | ICD-10-CM

## 2018-10-20 DIAGNOSIS — M1990 Unspecified osteoarthritis, unspecified site: Secondary | ICD-10-CM | POA: Diagnosis present

## 2018-10-20 DIAGNOSIS — G9741 Accidental puncture or laceration of dura during a procedure: Secondary | ICD-10-CM | POA: Diagnosis not present

## 2018-10-20 DIAGNOSIS — M5410 Radiculopathy, site unspecified: Secondary | ICD-10-CM | POA: Diagnosis present

## 2018-10-20 DIAGNOSIS — E1169 Type 2 diabetes mellitus with other specified complication: Secondary | ICD-10-CM | POA: Diagnosis not present

## 2018-10-20 DIAGNOSIS — Y92234 Operating room of hospital as the place of occurrence of the external cause: Secondary | ICD-10-CM | POA: Diagnosis not present

## 2018-10-20 DIAGNOSIS — Z833 Family history of diabetes mellitus: Secondary | ICD-10-CM | POA: Diagnosis not present

## 2018-10-20 DIAGNOSIS — Z7982 Long term (current) use of aspirin: Secondary | ICD-10-CM | POA: Diagnosis not present

## 2018-10-20 DIAGNOSIS — Z7984 Long term (current) use of oral hypoglycemic drugs: Secondary | ICD-10-CM | POA: Diagnosis not present

## 2018-10-20 DIAGNOSIS — M48061 Spinal stenosis, lumbar region without neurogenic claudication: Secondary | ICD-10-CM | POA: Diagnosis present

## 2018-10-20 DIAGNOSIS — R05 Cough: Secondary | ICD-10-CM

## 2018-10-20 HISTORY — PX: LUMBAR LAMINECTOMY/DECOMPRESSION MICRODISCECTOMY: SHX5026

## 2018-10-20 LAB — GLUCOSE, CAPILLARY
Glucose-Capillary: 108 mg/dL — ABNORMAL HIGH (ref 70–99)
Glucose-Capillary: 111 mg/dL — ABNORMAL HIGH (ref 70–99)
Glucose-Capillary: 116 mg/dL — ABNORMAL HIGH (ref 70–99)
Glucose-Capillary: 178 mg/dL — ABNORMAL HIGH (ref 70–99)

## 2018-10-20 SURGERY — LUMBAR LAMINECTOMY/DECOMPRESSION MICRODISCECTOMY
Anesthesia: General

## 2018-10-20 MED ORDER — CEFAZOLIN SODIUM-DEXTROSE 1-4 GM/50ML-% IV SOLN
1.0000 g | Freq: Three times a day (TID) | INTRAVENOUS | Status: AC
  Administered 2018-10-20 – 2018-10-21 (×2): 1 g via INTRAVENOUS
  Filled 2018-10-20 (×2): qty 50

## 2018-10-20 MED ORDER — ONDANSETRON HCL 4 MG/2ML IJ SOLN
INTRAMUSCULAR | Status: DC | PRN
  Administered 2018-10-20: 4 mg via INTRAVENOUS

## 2018-10-20 MED ORDER — SODIUM CHLORIDE 0.9 % IV SOLN: 250.0000 mL | INTRAVENOUS | Status: DC

## 2018-10-20 MED ORDER — BUPIVACAINE HCL (PF) 0.25 % IJ SOLN
INTRAMUSCULAR | Status: DC | PRN
  Administered 2018-10-20: 10 mL

## 2018-10-20 MED ORDER — ATROPINE SULFATE 0.4 MG/ML IJ SOLN
INTRAMUSCULAR | Status: DC | PRN
  Administered 2018-10-20: 0.2 mg via INTRAVENOUS

## 2018-10-20 MED ORDER — DEXAMETHASONE SODIUM PHOSPHATE 10 MG/ML IJ SOLN
INTRAMUSCULAR | Status: AC
  Filled 2018-10-20: qty 1

## 2018-10-20 MED ORDER — HYDROMORPHONE HCL 1 MG/ML IJ SOLN
INTRAMUSCULAR | Status: AC
  Filled 2018-10-20: qty 1

## 2018-10-20 MED ORDER — INSULIN ASPART 100 UNIT/ML ~~LOC~~ SOLN
0.0000 [IU] | Freq: Three times a day (TID) | SUBCUTANEOUS | Status: DC
  Administered 2018-10-21 – 2018-10-22 (×3): 2 [IU] via SUBCUTANEOUS

## 2018-10-20 MED ORDER — TRANEXAMIC ACID-NACL 1000-0.7 MG/100ML-% IV SOLN
INTRAVENOUS | Status: AC
  Filled 2018-10-20: qty 100

## 2018-10-20 MED ORDER — CEFAZOLIN SODIUM-DEXTROSE 2-4 GM/100ML-% IV SOLN
2.0000 g | INTRAVENOUS | Status: AC
  Administered 2018-10-20: 2 g via INTRAVENOUS

## 2018-10-20 MED ORDER — MIDAZOLAM HCL 2 MG/2ML IJ SOLN: 0.5000 mg | Freq: Once | INTRAMUSCULAR | Status: DC | PRN

## 2018-10-20 MED ORDER — ONDANSETRON HCL 4 MG PO TABS: 4.0000 mg | ORAL_TABLET | Freq: Four times a day (QID) | ORAL | Status: DC | PRN

## 2018-10-20 MED ORDER — ROSUVASTATIN CALCIUM 20 MG PO TABS
20.0000 mg | ORAL_TABLET | Freq: Every day | ORAL | Status: DC
  Administered 2018-10-20 – 2018-10-21 (×2): 20 mg via ORAL
  Filled 2018-10-20 (×2): qty 1

## 2018-10-20 MED ORDER — ROCURONIUM BROMIDE 10 MG/ML (PF) SYRINGE
PREFILLED_SYRINGE | INTRAVENOUS | Status: AC
  Filled 2018-10-20: qty 10

## 2018-10-20 MED ORDER — IPRATROPIUM-ALBUTEROL 0.5-2.5 (3) MG/3ML IN SOLN
3.0000 mL | Freq: Four times a day (QID) | RESPIRATORY_TRACT | Status: DC
  Administered 2018-10-20: 3 mL via RESPIRATORY_TRACT
  Filled 2018-10-20: qty 3

## 2018-10-20 MED ORDER — HYDROMORPHONE HCL 1 MG/ML IJ SOLN
0.5000 mg | INTRAMUSCULAR | Status: DC | PRN
  Administered 2018-10-20 – 2018-10-22 (×3): 0.5 mg via INTRAVENOUS
  Filled 2018-10-20 (×3): qty 1

## 2018-10-20 MED ORDER — FENTANYL CITRATE (PF) 250 MCG/5ML IJ SOLN
INTRAMUSCULAR | Status: AC
  Filled 2018-10-20: qty 5

## 2018-10-20 MED ORDER — NITROGLYCERIN 0.4 MG SL SUBL: 0.4000 mg | SUBLINGUAL_TABLET | SUBLINGUAL | Status: DC | PRN

## 2018-10-20 MED ORDER — EZETIMIBE 10 MG PO TABS
10.0000 mg | ORAL_TABLET | Freq: Every day | ORAL | Status: DC
  Administered 2018-10-21 – 2018-10-22 (×2): 10 mg via ORAL
  Filled 2018-10-20 (×2): qty 1

## 2018-10-20 MED ORDER — RANOLAZINE ER 500 MG PO TB12
1000.0000 mg | ORAL_TABLET | Freq: Two times a day (BID) | ORAL | Status: DC
  Administered 2018-10-20 – 2018-10-22 (×4): 1000 mg via ORAL
  Filled 2018-10-20 (×4): qty 2

## 2018-10-20 MED ORDER — LACTATED RINGERS IV SOLN
INTRAVENOUS | Status: DC | PRN
  Administered 2018-10-20 (×2): via INTRAVENOUS

## 2018-10-20 MED ORDER — ONDANSETRON HCL 4 MG/2ML IJ SOLN: 4.0000 mg | Freq: Four times a day (QID) | INTRAMUSCULAR | Status: DC | PRN

## 2018-10-20 MED ORDER — POLYETHYLENE GLYCOL 3350 17 G PO PACK: 17.0000 g | PACK | Freq: Every day | ORAL | Status: DC | PRN

## 2018-10-20 MED ORDER — PHENOL 1.4 % MT LIQD: 1.0000 | OROMUCOSAL | Status: DC | PRN

## 2018-10-20 MED ORDER — FINASTERIDE 5 MG PO TABS
5.0000 mg | ORAL_TABLET | Freq: Every day | ORAL | Status: DC
  Administered 2018-10-21 – 2018-10-22 (×2): 5 mg via ORAL
  Filled 2018-10-20 (×2): qty 1

## 2018-10-20 MED ORDER — SODIUM CHLORIDE 0.9 % IV SOLN
INTRAVENOUS | Status: DC
  Administered 2018-10-21: 16:00:00 via INTRAVENOUS

## 2018-10-20 MED ORDER — LIDOCAINE 2% (20 MG/ML) 5 ML SYRINGE
INTRAMUSCULAR | Status: DC | PRN
  Administered 2018-10-20: 40 mg via INTRAVENOUS
  Administered 2018-10-20: 60 mg via INTRAVENOUS

## 2018-10-20 MED ORDER — STERILE WATER FOR IRRIGATION IR SOLN
Status: DC | PRN
  Administered 2018-10-20: 1000 mL

## 2018-10-20 MED ORDER — MENTHOL 3 MG MT LOZG: 1.0000 | LOZENGE | OROMUCOSAL | Status: DC | PRN

## 2018-10-20 MED ORDER — VITAMIN B-12 100 MCG PO TABS
500.0000 ug | ORAL_TABLET | Freq: Every day | ORAL | Status: DC
  Administered 2018-10-21 – 2018-10-22 (×2): 500 ug via ORAL
  Filled 2018-10-20 (×2): qty 5

## 2018-10-20 MED ORDER — CO Q-10 200 MG PO CAPS: 200.0000 mg | ORAL_CAPSULE | Freq: Every day | ORAL | Status: DC

## 2018-10-20 MED ORDER — BUPIVACAINE HCL (PF) 0.25 % IJ SOLN
INTRAMUSCULAR | Status: AC
  Filled 2018-10-20: qty 30

## 2018-10-20 MED ORDER — DOCUSATE SODIUM 100 MG PO CAPS
100.0000 mg | ORAL_CAPSULE | Freq: Two times a day (BID) | ORAL | Status: DC
  Administered 2018-10-20 – 2018-10-22 (×4): 100 mg via ORAL
  Filled 2018-10-20 (×4): qty 1

## 2018-10-20 MED ORDER — SUGAMMADEX SODIUM 200 MG/2ML IV SOLN
INTRAVENOUS | Status: DC | PRN
  Administered 2018-10-20: 300 mg via INTRAVENOUS

## 2018-10-20 MED ORDER — DEXAMETHASONE SODIUM PHOSPHATE 10 MG/ML IJ SOLN
INTRAMUSCULAR | Status: DC | PRN
  Administered 2018-10-20: 8 mg via INTRAVENOUS

## 2018-10-20 MED ORDER — ACETAMINOPHEN 650 MG RE SUPP: 650.0000 mg | RECTAL | Status: DC | PRN

## 2018-10-20 MED ORDER — HYDROMORPHONE HCL 1 MG/ML IJ SOLN
0.2500 mg | INTRAMUSCULAR | Status: DC | PRN
  Administered 2018-10-20 (×2): 0.5 mg via INTRAVENOUS

## 2018-10-20 MED ORDER — SUCCINYLCHOLINE CHLORIDE 200 MG/10ML IV SOSY
PREFILLED_SYRINGE | INTRAVENOUS | Status: AC
  Filled 2018-10-20: qty 10

## 2018-10-20 MED ORDER — FENTANYL CITRATE (PF) 100 MCG/2ML IJ SOLN
INTRAMUSCULAR | Status: DC | PRN
  Administered 2018-10-20: 50 ug via INTRAVENOUS
  Administered 2018-10-20: 100 ug via INTRAVENOUS
  Administered 2018-10-20 (×2): 50 ug via INTRAVENOUS

## 2018-10-20 MED ORDER — PHENYLEPHRINE 40 MCG/ML (10ML) SYRINGE FOR IV PUSH (FOR BLOOD PRESSURE SUPPORT)
PREFILLED_SYRINGE | INTRAVENOUS | Status: AC
  Filled 2018-10-20: qty 10

## 2018-10-20 MED ORDER — PROPOFOL 10 MG/ML IV BOLUS
INTRAVENOUS | Status: DC | PRN
  Administered 2018-10-20: 50 mg via INTRAVENOUS
  Administered 2018-10-20: 150 mg via INTRAVENOUS

## 2018-10-20 MED ORDER — ACETAMINOPHEN 325 MG PO TABS
650.0000 mg | ORAL_TABLET | ORAL | Status: DC | PRN
  Administered 2018-10-21: 650 mg via ORAL
  Filled 2018-10-20: qty 2

## 2018-10-20 MED ORDER — PROMETHAZINE HCL 25 MG/ML IJ SOLN: 6.2500 mg | INTRAMUSCULAR | Status: DC | PRN

## 2018-10-20 MED ORDER — LIDOCAINE 2% (20 MG/ML) 5 ML SYRINGE
INTRAMUSCULAR | Status: AC
  Filled 2018-10-20: qty 5

## 2018-10-20 MED ORDER — MEPERIDINE HCL 25 MG/ML IJ SOLN: 6.2500 mg | INTRAMUSCULAR | Status: DC | PRN

## 2018-10-20 MED ORDER — OXYCODONE HCL 5 MG PO TABS
5.0000 mg | ORAL_TABLET | Freq: Four times a day (QID) | ORAL | Status: DC | PRN
  Administered 2018-10-20 – 2018-10-22 (×4): 5 mg via ORAL
  Filled 2018-10-20 (×4): qty 1

## 2018-10-20 MED ORDER — CEFAZOLIN SODIUM-DEXTROSE 2-4 GM/100ML-% IV SOLN
INTRAVENOUS | Status: AC
  Filled 2018-10-20: qty 100

## 2018-10-20 MED ORDER — CHLORHEXIDINE GLUCONATE 4 % EX LIQD: 60.0000 mL | Freq: Once | CUTANEOUS | Status: DC

## 2018-10-20 MED ORDER — IPRATROPIUM-ALBUTEROL 0.5-2.5 (3) MG/3ML IN SOLN: 3.0000 mL | Freq: Four times a day (QID) | RESPIRATORY_TRACT | Status: DC | PRN

## 2018-10-20 MED ORDER — SODIUM CHLORIDE 0.9% FLUSH
3.0000 mL | Freq: Two times a day (BID) | INTRAVENOUS | Status: DC
  Administered 2018-10-20: 3 mL via INTRAVENOUS

## 2018-10-20 MED ORDER — SODIUM CHLORIDE 0.9 % IV SOLN
INTRAVENOUS | Status: DC | PRN
  Administered 2018-10-20: 60 ug/min via INTRAVENOUS

## 2018-10-20 MED ORDER — 0.9 % SODIUM CHLORIDE (POUR BTL) OPTIME
TOPICAL | Status: DC | PRN
  Administered 2018-10-20: 1000 mL

## 2018-10-20 MED ORDER — CARVEDILOL 6.25 MG PO TABS
6.2500 mg | ORAL_TABLET | Freq: Two times a day (BID) | ORAL | Status: DC
  Administered 2018-10-21 – 2018-10-22 (×3): 6.25 mg via ORAL
  Filled 2018-10-20 (×3): qty 1

## 2018-10-20 MED ORDER — TRANEXAMIC ACID-NACL 1000-0.7 MG/100ML-% IV SOLN
INTRAVENOUS | Status: DC | PRN
  Administered 2018-10-20: 1000 mg via INTRAVENOUS

## 2018-10-20 MED ORDER — SODIUM CHLORIDE 0.9% FLUSH: 3.0000 mL | INTRAVENOUS | Status: DC | PRN

## 2018-10-20 MED ORDER — ONDANSETRON HCL 4 MG/2ML IJ SOLN
INTRAMUSCULAR | Status: AC
  Filled 2018-10-20: qty 2

## 2018-10-20 MED ORDER — GLYCOPYRROLATE PF 0.2 MG/ML IJ SOSY
PREFILLED_SYRINGE | INTRAMUSCULAR | Status: DC | PRN
  Administered 2018-10-20: .1 mg via INTRAVENOUS

## 2018-10-20 MED ORDER — PHENYLEPHRINE 40 MCG/ML (10ML) SYRINGE FOR IV PUSH (FOR BLOOD PRESSURE SUPPORT)
PREFILLED_SYRINGE | INTRAVENOUS | Status: DC | PRN
  Administered 2018-10-20: 80 ug via INTRAVENOUS

## 2018-10-20 MED ORDER — METFORMIN HCL 500 MG PO TABS
500.0000 mg | ORAL_TABLET | Freq: Every day | ORAL | Status: DC
  Administered 2018-10-21 – 2018-10-22 (×2): 500 mg via ORAL
  Filled 2018-10-20 (×2): qty 1

## 2018-10-20 MED ORDER — AMLODIPINE BESYLATE 2.5 MG PO TABS
2.5000 mg | ORAL_TABLET | Freq: Every day | ORAL | Status: DC
  Administered 2018-10-21 – 2018-10-22 (×2): 2.5 mg via ORAL
  Filled 2018-10-20 (×2): qty 1

## 2018-10-20 MED ORDER — HEMOSTATIC AGENTS (NO CHARGE) OPTIME
TOPICAL | Status: DC | PRN
  Administered 2018-10-20: 1 via TOPICAL

## 2018-10-20 MED ORDER — LACTATED RINGERS IV SOLN
INTRAVENOUS | Status: DC
  Administered 2018-10-20: 11:00:00 via INTRAVENOUS

## 2018-10-20 MED ORDER — GLYCOPYRROLATE PF 0.2 MG/ML IJ SOSY
PREFILLED_SYRINGE | INTRAMUSCULAR | Status: AC
  Filled 2018-10-20: qty 1

## 2018-10-20 MED ORDER — ROCURONIUM BROMIDE 50 MG/5ML IV SOSY
PREFILLED_SYRINGE | INTRAVENOUS | Status: DC | PRN
  Administered 2018-10-20: 60 mg via INTRAVENOUS
  Administered 2018-10-20: 10 mg via INTRAVENOUS

## 2018-10-20 SURGICAL SUPPLY — 45 items
BUR ROUND FLUTED 4 SOFT TCH (BURR) ×1 IMPLANT
CANISTER SUCT 3000ML PPV (MISCELLANEOUS) ×2 IMPLANT
CLSR STERI-STRIP ANTIMIC 1/2X4 (GAUZE/BANDAGES/DRESSINGS) ×2 IMPLANT
COVER SURGICAL LIGHT HANDLE (MISCELLANEOUS) ×2 IMPLANT
COVER WAND RF STERILE (DRAPES) ×1 IMPLANT
DECANTER SPIKE VIAL GLASS SM (MISCELLANEOUS) ×2 IMPLANT
DRAPE HALF SHEET 40X57 (DRAPES) ×4 IMPLANT
DRAPE MICROSCOPE LEICA (MISCELLANEOUS) ×2 IMPLANT
DRAPE SURG 17X23 STRL (DRAPES) ×2 IMPLANT
DRSG MEPILEX BORDER 4X4 (GAUZE/BANDAGES/DRESSINGS) ×2 IMPLANT
DRSG MEPILEX BORDER 4X8 (GAUZE/BANDAGES/DRESSINGS) ×1 IMPLANT
DURAPREP 26ML APPLICATOR (WOUND CARE) ×2 IMPLANT
DURASEAL SPINE SEALANT 3ML (MISCELLANEOUS) ×1 IMPLANT
ELECT REM PT RETURN 9FT ADLT (ELECTROSURGICAL) ×2
ELECTRODE REM PT RTRN 9FT ADLT (ELECTROSURGICAL) ×1 IMPLANT
GLOVE BIOGEL PI IND STRL 8 (GLOVE) ×2 IMPLANT
GLOVE BIOGEL PI INDICATOR 8 (GLOVE) ×2
GLOVE ORTHO TXT STRL SZ7.5 (GLOVE) ×4 IMPLANT
GOWN STRL REUS W/ TWL LRG LVL3 (GOWN DISPOSABLE) ×2 IMPLANT
GOWN STRL REUS W/ TWL XL LVL3 (GOWN DISPOSABLE) ×1 IMPLANT
GOWN STRL REUS W/TWL 2XL LVL3 (GOWN DISPOSABLE) ×2 IMPLANT
GOWN STRL REUS W/TWL LRG LVL3 (GOWN DISPOSABLE) ×2
GOWN STRL REUS W/TWL XL LVL3 (GOWN DISPOSABLE) ×1
KIT BASIN OR (CUSTOM PROCEDURE TRAY) ×2 IMPLANT
KIT TURNOVER KIT B (KITS) ×2 IMPLANT
MANIFOLD NEPTUNE II (INSTRUMENTS) ×2 IMPLANT
NDL HYPO 25GX1X1/2 BEV (NEEDLE) ×1 IMPLANT
NDL SPNL 18GX3.5 QUINCKE PK (NEEDLE) ×1 IMPLANT
NEEDLE HYPO 25GX1X1/2 BEV (NEEDLE) ×2 IMPLANT
NEEDLE SPNL 18GX3.5 QUINCKE PK (NEEDLE) ×4 IMPLANT
NS IRRIG 1000ML POUR BTL (IV SOLUTION) ×2 IMPLANT
PACK LAMINECTOMY ORTHO (CUSTOM PROCEDURE TRAY) ×2 IMPLANT
PAD ARMBOARD 7.5X6 YLW CONV (MISCELLANEOUS) ×4 IMPLANT
PATTIES SURGICAL .5 X.5 (GAUZE/BANDAGES/DRESSINGS) ×2 IMPLANT
PATTIES SURGICAL .75X.75 (GAUZE/BANDAGES/DRESSINGS) ×2 IMPLANT
SUT NURALON 4 0 TR CR/8 (SUTURE) ×1 IMPLANT
SUT VIC AB 0 CT1 27 (SUTURE)
SUT VIC AB 0 CT1 27XBRD ANBCTR (SUTURE) IMPLANT
SUT VIC AB 1 CTX 36 (SUTURE) ×1
SUT VIC AB 1 CTX36XBRD ANBCTR (SUTURE) ×1 IMPLANT
SUT VIC AB 2-0 CT1 27 (SUTURE) ×1
SUT VIC AB 2-0 CT1 TAPERPNT 27 (SUTURE) ×1 IMPLANT
SUT VIC AB 3-0 X1 27 (SUTURE) ×2 IMPLANT
TOWEL GREEN STERILE (TOWEL DISPOSABLE) ×2 IMPLANT
TOWEL GREEN STERILE FF (TOWEL DISPOSABLE) ×2 IMPLANT

## 2018-10-20 NOTE — Anesthesia Procedure Notes (Addendum)
Procedure Name: Intubation Date/Time: 10/20/2018 12:55 PM Performed by: Orlie Dakin, CRNA Pre-anesthesia Checklist: Patient identified, Emergency Drugs available, Suction available and Patient being monitored Patient Re-evaluated:Patient Re-evaluated prior to induction Oxygen Delivery Method: Circle system utilized Preoxygenation: Pre-oxygenation with 100% oxygen Induction Type: IV induction Ventilation: Mask ventilation without difficulty and Oral airway inserted - appropriate to patient size Laryngoscope Size: Glidescope and 4 (unsuccessful with Miller 3, Mac 3) Grade View: Grade III Tube type: Oral Tube size: 7.5 mm Number of attempts: 4 Airway Equipment and Method: Stylet and Video-laryngoscopy (easy VideoGlide) Placement Confirmation: ETT inserted through vocal cords under direct vision,  positive ETCO2 and breath sounds checked- equal and bilateral Secured at: 23 cm Tube secured with: Tape Dental Injury: Injury to lip  Difficulty Due To: Difficulty was unanticipated, Difficult Airway- due to reduced neck mobility, Difficult Airway- due to anterior larynx and Difficult Airway- due to immobile epiglottis Comments: 1st DL Kahlyn Shippey CRNA, Miller 3, grade 4 view.  2nd DL Dr Glennon Mac MAC 3, esophageal intubation immediately recognized.  3rd DL Dr Glennon Mac MAC 3, bougie, unable to pass and then Glidecope 4 used with success.  Scant amount of blood upper lip noted.  4x4s bite block used.

## 2018-10-20 NOTE — Discharge Instructions (Addendum)
°  October 20, 2018  [name] [address] [place] [address]   Dear Mr. Klingerman,   I hope you are doing well after your surgery.  Included with this note is a letter you should receive after your surgery.   During your recent procedure at [facility], your anesthesia team determined that you were a difficult intubation.  This means that there was difficulty in placing a breathing tube from your mouth into your windpipe.  This procedure is commonly performed for general anesthetics for the purpose of providing oxygen and anesthetic gases during the operation.  Routinely, this is done using an instrument, known as a laryngoscope, to visualize the vocal cords and directly place the breathing tube into the trachea. We had difficulty visualizing past your epiglottis with conventional laryngoscopes. We used a Video laryngoscope and very easily passed the breathing tube with this equipment.  It is very important for you to tell your Anesthesiologist when you have future operations, that you are a difficult intubation with conventional laryngoscopy, so that other arrangements, personnel, or instruments (VideoGlide scope) can be obtained to ensure your safety.   Consider wearing a Medical Alert Bracelet, or carrying one in your wallet, in case an emergency arises.  There are many companies who will sell you such a bracelet in many styles.  They are easily found on the internet by searching "Medical Alert Bracelet."  Yours should have the words "Difficult Intubation" written on the bracelet.   It was a pleasure to take care of you for your surgery, and I hope the best for you in the future. Should you have any questions, please feel free to contact me at [phone].   Mollie Germany, MD  [facility]   Postop instructions from Dr. Lorin Mercy  Your dressing is waterproof he can take a shower with the dressing on dry off with a towel afterwards.  You can work on walking  daily and lay down and rest when you get tired.  Pain medication has been sent into your pharmacy that she can pick up which is Percocet 5/325 you can take 1 or 2 every 6 hours as needed for the pain.  He will continue on all your medications after discharge .  You likely will not need the Naprosyn since your lumbar stenosis problem has been surgically corrected.  Make an appointment to see Dr. Lorin Mercy in 1 week if you do not already have one.  Call if you have any questions fever or chills.  Office number [phone].

## 2018-10-20 NOTE — Plan of Care (Signed)

## 2018-10-20 NOTE — Transfer of Care (Signed)
Immediate Anesthesia Transfer of Care Note  Patient: Jacob Harrison  Procedure(s) Performed: L3-4, L4-5 LUMBAR DECOMPRESSION (N/A )  Patient Location: PACU  Anesthesia Type:General  Level of Consciousness: awake, oriented and patient cooperative  Airway & Oxygen Therapy: Patient Spontanous Breathing and Patient connected to face mask oxygen  Post-op Assessment: Report given to RN, Post -op Vital signs reviewed and stable and Patient moving all extremities X 4  Post vital signs: Reviewed and stable  Last Vitals:  Vitals Value Taken Time  BP 122/70 10/20/18 1525  Temp    Pulse 76 10/20/18 1527  Resp 20 10/20/18 1527  SpO2 97 % 10/20/18 1527  Vitals shown include unvalidated device data.  Last Pain:  Vitals:   10/20/18 1035  TempSrc:   PainSc: 0-No pain      Patients Stated Pain Goal: 2 (89/16/94 5038)  Complications: No apparent anesthesia complications

## 2018-10-20 NOTE — Anesthesia Preprocedure Evaluation (Addendum)
Anesthesia Evaluation  Patient identified by MRN, date of birth, ID band Patient awake    Reviewed: Allergy & Precautions, NPO status , Patient's Chart, lab work & pertinent test results, reviewed documented beta blocker date and time   History of Anesthesia Complications Negative for: history of anesthetic complications  Airway Mallampati: II  TM Distance: <3 FB Neck ROM: Full    Dental  (+) Dental Advisory Given, Caps, Teeth Intact   Pulmonary  10/16/2018 SARS coronavirus NEG   breath sounds clear to auscultation       Cardiovascular hypertension, Pt. on medications and Pt. on home beta blockers (-) angina+ CAD and + CABG   Rhythm:Regular Rate:Normal  07/2018 Nuclear stress: study is normal, Stress EF 53%. '18 ECHO: EF 65-70%, aortic valve sclerosis without stenosis   Neuro/Psych    GI/Hepatic negative GI ROS, Neg liver ROS,   Endo/Other  diabetes (glu 108), Oral Hypoglycemic Agentsobese  Renal/GU negative Renal ROS     Musculoskeletal  (+) Arthritis ,   Abdominal (+) + obese,   Peds  Hematology negative hematology ROS (+)   Anesthesia Other Findings   Reproductive/Obstetrics                           Anesthesia Physical Anesthesia Plan  ASA: III  Anesthesia Plan: General   Post-op Pain Management:    Induction: Intravenous  PONV Risk Score and Plan: 3 and Ondansetron, Dexamethasone and Treatment may vary due to age or medical condition  Airway Management Planned: Oral ETT  Additional Equipment:   Intra-op Plan:   Post-operative Plan: Extubation in OR  Informed Consent: I have reviewed the patients History and Physical, chart, labs and discussed the procedure including the risks, benefits and alternatives for the proposed anesthesia with the patient or authorized representative who has indicated his/her understanding and acceptance.     Dental advisory given  Plan  Discussed with: CRNA and Surgeon  Anesthesia Plan Comments:        Anesthesia Quick Evaluation

## 2018-10-20 NOTE — Interval H&P Note (Signed)
History and Physical Interval Note:  10/20/2018 12:39 PM  Jacob Harrison  has presented today for surgery, with the diagnosis of Lumbar 3-4, Lumbar 4-5 severe stenosis.  The various methods of treatment have been discussed with the patient and family. After consideration of risks, benefits and other options for treatment, the patient has consented to  Procedure(s): L3-4, L4-5 LUMBAR DECOMPRESSION (N/A) as a surgical intervention.  The patient's history has been reviewed, patient examined, no change in status, stable for surgery.  I have reviewed the patient's chart and labs.  Questions were answered to the patient's satisfaction.     Marybelle Killings

## 2018-10-20 NOTE — Anesthesia Postprocedure Evaluation (Signed)
Anesthesia Post Note  Patient: Doug Bucklin  Procedure(s) Performed: L3-4, L4-5 LUMBAR DECOMPRESSION with dural repair. (N/A )     Patient location during evaluation: PACU Anesthesia Type: General Level of consciousness: awake and alert, oriented and patient cooperative Pain management: pain level controlled Vital Signs Assessment: post-procedure vital signs reviewed and stable Respiratory status: spontaneous breathing, nonlabored ventilation and respiratory function stable Cardiovascular status: blood pressure returned to baseline and stable Postop Assessment: no apparent nausea or vomiting Anesthetic complications: no Comments: Discussed difficult intubation and need for VideoGlide scope intubation with patient. He understands he will receive a letter for reference, should he require surgery in the future.    Last Vitals:  Vitals:   10/20/18 1625 10/20/18 1655  BP: (!) 102/56 (!) 110/55  Pulse: 61 62  Resp: 11 12  Temp: 36.7 C   SpO2: 96% 93%    Last Pain:  Vitals:   10/20/18 1625  TempSrc:   PainSc: 3                  Shantese Raven,E. Haddon Fyfe

## 2018-10-20 NOTE — H&P (Signed)
Patient: Jacob Harrison                                      Date of Birth: 09-17-1940                                                   MRN: [id number] Visit Date: 06/15/2018                                                                     Requested by: Esaw Grandchild, NP [place], [place] [id number] PCP: Esaw Grandchild, NP   Assessment & Plan: Visit Diagnoses:  1. Spinal stenosis of lumbar region with neurogenic claudication     Plan: Patient states his pain is progressed.  He started going places where he is to stop and sit after walking 3 to 400 feet after sit for 5 minutes and then get up and walk again the same distance.  MRI scan shows severe stenosis at L3-4 and L4-5 with milder changes at other levels.  Plan will be two level surgical central decompression at L3-4 and L4-5  with overnight stay.  Risks of dural tear progression of lumbar stenosis possible progression of other levels discussed.  He will need cardiac medical clearance preoperatively for his diabetes and past history of coronary artery disease.  He has an upcoming appointment with his cardiologist.  Questions were elicited and answered.  Follow-Up Instructions: No follow-ups on file.   Orders:  No orders of the defined types were placed in this encounter.  No orders of the defined types were placed in this encounter.     Procedures: No procedures performed   Clinical Data: No additional findings.   Subjective:     Chief Complaint  Patient presents with  . Back Pain    wants to discuss surgery    HPI 78 year old male returns with ongoing problems with his back and claudication symptoms that occur after walking 40 feet.  He has to stop and sit for 5 minutes and is able to repeat the distance.  He can stand 5 minutes he does better if he leans forward over a grocery cart and can walk all around the store as long as he leans on the cart.  He denies associated  bowel or bladder symptoms.  He can stand 5 to 10 minutes and has to sit he ambulates with a cane no pain when he sitting or laying down.  He did not radiate to her back and his buttocks.  Last A1c was 6.2 on 05/19/2018 and he is here to discuss surgical intervention post MRI.  MRI showed severe stenosis at L3-4 L4-5 with mild to moderate at 2 3 and some disc protrusion at 5 1 which was stable.  Review of Systems positive for vitamin D deficiency hyperlipidemia type 2 diabetes coronary artery disease with previous surgery.  Hypertension history of ischemic cardiomyopathy.  Neurogenic claudication symptoms.  ABIs 2 years ago normal.  History of bronchitis bradycardia.  [place]  point review of systems otherwise negative is a pertains HPI.   Objective: Vital Signs: BP 138/84   Pulse 77   Ht 5\' 7"  (1.702 m)   Wt 200 lb (90.7 kg)   BMI 31.32 kg/m   Physical Exam Constitutional:      Appearance: He is well-developed.  HENT:     Head: Normocephalic and atraumatic.  Eyes:     Pupils: Pupils are equal, round, and reactive to light.  Neck:     Thyroid: No thyromegaly.     Trachea: No tracheal deviation.  Cardiovascular:     Rate and Rhythm: Normal rate.  Pulmonary:     Effort: Pulmonary effort is normal.     Breath sounds: No wheezing.  Abdominal:     General: Bowel sounds are normal.     Palpations: Abdomen is soft.  Skin:    General: Skin is warm and dry.     Capillary Refill: Capillary refill takes less than 2 seconds.  Neurological:     Mental Status: He is alert and oriented to person, place, and time.  Psychiatric:        Behavior: Behavior normal.        Thought Content: Thought content normal.        Judgment: Judgment normal.     Ortho Exam patient has intact pedal pulses.  No pain with hip range of motion.  He is able to get from sitting to standing and can ambulate across exam room.  No hip flexion weakness.  Knees reach full extension.  Specialty Comments:  No  specialty comments available.  Imaging:CLINICAL DATA: Chronic low back pain  EXAM: MRI LUMBAR SPINE WITHOUT CONTRAST  TECHNIQUE: Multiplanar, multisequence MR imaging of the lumbar spine was performed. No intravenous contrast was administered.  COMPARISON: Lumbar spine radiographs 09/01/2017  FINDINGS: Segmentation: Normal  Alignment: Normal  Vertebrae: Negative for fracture or mass. Normal bone marrow.  Conus medullaris and cauda equina: Conus extends to the L1-2 level. Conus and cauda equina appear normal.  Paraspinal and other soft tissues: Negative for paraspinous mass or soft tissue swelling  Disc levels:  T12-L1: Negative  L1-2: Mild disc and mild facet degeneration with mild spinal stenosis  L2-3: Moderate disc bulging and mild facet hypertrophy. Moderately severe spinal stenosis. Neural foramina patent bilaterally. Subarticular stenosis left greater than right  L3-4: Diffuse bulging of the disc with endplate spurring. Moderate facet and ligamentum flavum hypertrophy causing severe spinal stenosis. Marked subarticular stenosis bilaterally.  L4-5: Severe spinal stenosis. Diffuse disc bulging and endplate spurring right greater than left. Endplate edema on the right. Moderate facet and ligamentum flavum hypertrophy bilaterally. Marked subarticular stenosis bilaterally.  L5-S1: Large central disc and osteophyte complex with associated disc space narrowing. This appears chronic. There is displacement of the right S1 nerve root which is not compressed. Subarticular stenosis due to spurring bilaterally.  IMPRESSION: Extensive multilevel lumbar degenerative change.  Mild spinal stenosis L2-3  Moderately severe spinal stenosis L2-3 with subarticular stenosis left greater than right  Severe spinal stenosis L3-4 with marked subarticular stenosis bilaterally  Severe spinal stenosis L4-5 with marked subarticular  stenosis bilaterally  Large central disc and osteophyte complex with subarticular stenosis bilaterally. Mild displacement of the right S1 nerve root.   Electronically Signed By: Franchot Gallo M.D. On: 09/22/2017 10:44     PMFS History:     Patient Active Problem List   Diagnosis Date Noted  . Flu-like symptoms 05/05/2018  . Bacterial conjunctivitis of both eyes 04/27/2018  .  Atypical mole 07/30/2017  . Healthcare maintenance 07/30/2017  . Wheezing 07/21/2017  . Acute bronchitis 07/21/2017  . Neck pain 06/08/2017  . Sinus bradycardia 03/18/2017  . Screening for colon cancer 02/25/2017  . Ventral hernia without obstruction or gangrene 02/25/2017  . (HFpEF) heart failure with preserved ejection fraction ([place]) 09/28/2016  . Ischemic cardiomyopathy 09/28/2016  . Cough in adult 07/22/2016  . Vitamin D deficiency 06/27/2016  . Vitamin B deficiency 06/27/2016  . Hyperlipidemia LDL goal <70 06/21/2016  . Weakness of both lower extremities 06/21/2016  . Diabetes mellitus without complication ([place]) 36/14/4315  . Essential hypertension 05/20/2016  . Hyperlipidemia associated with type 2 diabetes mellitus ([place]) 05/20/2016  . Coronary artery disease 05/20/2016       Past Medical History:  Diagnosis Date  . (HFpEF) heart failure with preserved ejection fraction ([place])    a. 10/2016 Echo: EF 65-70%, mild LVH, mildly dil LA. RV fxn nl.  Marland Kitchen BPH (benign prostatic hyperplasia)   . Coronary artery disease    a. 2001 CABG x 3 [facility]): LIMA->LAD, VG->LCX, VG->RPDA; b. 04/05/2010 Cath ([facility]): LM 46m, LAD 123m, LCX 34m, RCA 100p, LIMA->LAD atretic, VG->LCX 100, VG->RCA 80ost, 70d; c. 04/2010 Redo CABG x 2 ([facility]): VG->LAD, VG->RPDA; d. 10/2012 Cath [facility]): LM 20-30, LAD 100/95-82m, LCX 40-17m, RCA 100p, 80-100d, RPDA 99, RPL1 95, VG->LAD ok, VG->RPDA ok, EF 67%; e. 11/2016 MV: EF 48%, No ischemia.  . Diabetes mellitus without complication ([place])   . Hyperlipidemia   .  Scoliosis   . Spinal stenosis          Family History  Problem Relation Age of Onset  . Diabetes Mother   . Hyperlipidemia Mother   . Cancer Father        colon  . Alcohol abuse Maternal Uncle   . Diabetes Maternal Uncle   . Hyperlipidemia Maternal Uncle   . Cancer Paternal Uncle        colon         Past Surgical History:  Procedure Laterality Date  . CORONARY ARTERY BYPASS GRAFT     x 2 (2001 and redo in late 2011 or early 2012)  . HERNIA REPAIR Left    inguinal  . TONSILLECTOMY AND ADENOIDECTOMY    . TRANSURETHRAL RESECTION OF PROSTATE  1990   Social History        Occupational History  . Not on file  Tobacco Use  . Smoking status: Never Smoker  . Smokeless tobacco: Never Used  Substance and Sexual Activity  . Alcohol use: Yes    Alcohol/week: 11.0 standard drinks    Types: 5 Glasses of wine, 6 Cans of beer per week  . Drug use: No  . Sexual activity: Not Currently    Birth control/protection: None

## 2018-10-20 NOTE — Op Note (Addendum)
Preop diagnosis: L3-4, L4-5 severe spinal stenosis  Postop diagnosis: Same  Procedure: L3-4 L4-5 central decompression for severe stenosis.  Dural repair right dorsal L4-5 with watertight closure.  Surgeon: Rodell Perna, MD  Assistant: Benjiman Core, PA-C medically necessary and present for the entire procedure  EBL: 400 cc  Anesthesia General anesthesia plus Marcaine skin local  Brief history 78 year old male with progressive neurogenic claudication and severe stenosis at L3-4 L4-5 failed conservative treatment.  He has had progressive symptoms the point he is having difficulty standing for more than 10 minutes difficulty ambulating even short distances.  MRI scan showed severe spinal stenosis multifactorial L3-4 and also L4-5.  He had mild to moderate changes at other levels.  Procedure after induction general esthesia orotracheal ovation patient placed prone on chest rolls preoperative Ancef was given DuraPrep after arms were placed at 9090 with yellow roll pads anterior to the shoulders and ulnar nerve padded.  Leg pumpers were applied.  After clearing the towel after prepping laminectomy sheet Betadine Steri-Drape was applied timeout was completed and then crosstable lateral x-ray was taken with the needle 18-gauge spinal placed at the top portion of L3 pedicle and the inferior needle was just below the L4-5 disc space.  Needles were adjusted repeat images taken and then the skin was marked with a purple skin marker.  Midline incision was made suppressed with section of the lamina and then 2 curved Kocher clamps were placed at the planned level for decompression confirmed with a second x-ray.  Bone was marked with a purple marker and then L3 and L4 laminectomy was performed removing the spinous process thick chunks of ligament.Laminectomy L3, L4 and top portiion of L5 bilateral 3 vertebral segments.   Chunks of ligament were adherent to the dura with overhanging spurs and with protection of the dura  with tiny patties using a 2 mm Kerrison at the L4-5 level which was severely tight dural tear occurred from cephalad to caudad longitudinally on the right which was on the dorsal lateral surface.  Patty was used to protect the durotomy defect.  Continued dissection removing chunks of ligament till decompression was performed moving bone out to the levels of the pedicle completing central decompression at L3 , L4 and top portion of L5.   At this point we had enough room and epidural veins were coagulated with the bipolar some Surgi-Flo was used.  All tissue was particularly oozing with epidural veins being prominent.  Surgi-Flo and small patties were placed in the gutter and then dural repair was performed using them operative microscope that been used for the decompression and 4-0 Nurolon with interrupted sutures a total of 4 sutures were placed patient was bagged by the CRNA at over 30 cm of pressure with watertight closure no leakage and then some blue DuraSeal was applied over the repair for secondary reinforcement.  Repeat bagging 30 cmH2O again showed no leakage.  Gutters were checked make sure there is no remaining epidural bleeding.  Operative microscope was removed repeat irrigation which have been used intermittently during the case was again done copiously.  Fascia closed with #1 Vicryl 2-0 Vicryl subtenons tissue skin staple closure postop dressing and transferred to care room.  Patient will be spine until tomorrow at noon time and then will begin to mobilize patient slowly make sure he does not have any problems with headache problems.  Patient tolerated the procedure well and was hemodynamically stable throughout the entire case.

## 2018-10-20 NOTE — H&P (Signed)
Jacob Harrison is an 78 y.o. male.   Chief Complaint: Back pain and lower extremity radiculopathy HPI: 78 year old white male with history of L3-4 and L4-5 stenosis comes in for preop evaluation.  Symptoms unchanged from previous visit.  He is want to proceed with L3-4 and L4-5 decompression as scheduled.  We have received preop cardiac clearance.   Past Medical History:  Diagnosis Date  . (HFpEF) heart failure with preserved ejection fraction ([place])    a. 10/2016 Echo: EF 65-70%, mild LVH, mildly dil LA. RV fxn nl.  . Arthritis   . BPH (benign prostatic hyperplasia)   . Coronary artery disease    a. 2001 CABG x 3 [facility]): LIMA->LAD, VG->LCX, VG->RPDA; b. 04/05/2010 Cath ([facility]): LM 13m, LAD 170m, LCX 26m, RCA 100p, LIMA->LAD atretic, VG->LCX 100, VG->RCA 80ost, 70d; c. 04/2010 Redo CABG x 2 ([facility]): VG->LAD, VG->RPDA; d. 10/2012 Cath [facility]): LM 20-30, LAD 100/95-75m, LCX 40-60m, RCA 100p, 80-100d, RPDA 99, RPL1 95, VG->LAD ok, VG->RPDA ok, EF 67%; e. 11/2016 MV: EF 48%, No ischemia.  . 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 Hyperlipidemia   . Hypertension   . Scoliosis   . Spinal stenosis     Past Surgical History:  Procedure Laterality Date  . CARDIAC CATHETERIZATION    . CORONARY ARTERY BYPASS GRAFT     x 2 (2001 and redo in late 2011 or early 2012)  . HERNIA REPAIR Left    inguinal  . TONSILLECTOMY AND ADENOIDECTOMY    . TRANSURETHRAL RESECTION OF PROSTATE  1990    Family History  Problem Relation Age of Onset  . Diabetes Mother   . Hyperlipidemia Mother   . Cancer Father        colon  . Alcohol abuse Maternal Uncle   . Diabetes Maternal Uncle   . Hyperlipidemia Maternal Uncle   . Cancer Paternal Uncle        colon   Social History:  reports that he has never smoked. He has never used smokeless tobacco. He reports current alcohol use of about 11.0 standard drinks of alcohol per week. He reports that he does not use drugs.  Allergies:   Allergies  Allergen Reactions  . Isosorbide Mononitrate [Isosorbide Dinitrate Er]     Headaches  . Tetracyclines & Related Rash    Facility-Administered Medications Prior to Admission  Medication Dose Route Frequency Provider Last Rate Last Dose  . ipratropium-albuterol (DUONEB) 0.5-2.5 (3) MG/3ML nebulizer solution 3 mL  3 mL Nebulization Q6H Danford, Katy D, NP   3 mL at 07/21/17 1646   Medications Prior to Admission  Medication Sig Dispense Refill  . amLODipine (NORVASC) 2.5 MG tablet Take 1 tablet (2.5 mg total) by mouth daily. 30 tablet 11  . aspirin EC 81 MG tablet Take 1 tablet (81 mg total) by mouth daily.    . carvedilol (COREG) 6.25 MG tablet Take 1 tablet by mouth twice daily (Patient taking differently: Take 6.25 mg by mouth 2 (two) times daily with a meal. ) 180 tablet 0  . Cholecalciferol (VITAMIN D-3 PO) Take 800 Units by mouth daily.    . Coenzyme Q10 (CO Q-10) 200 MG CAPS Take 200 mg by mouth daily.     . cyanocobalamin 500 MCG tablet Take 500 mcg by mouth daily.    Marland Kitchen ezetimibe (ZETIA) 10 MG tablet Take 1 tablet by mouth once daily (Patient taking differently: Take 10 mg by mouth daily. ) 90 tablet 0  .  finasteride (PROSCAR) 5 MG tablet Take 1 tablet by mouth once daily (Patient taking differently: Take 5 mg by mouth daily. ) 90 tablet 0  . Glucosamine-Chondroit-Vit C-Mn (GLUCOSAMINE 1500 COMPLEX) CAPS Take 1 capsule by mouth daily.    . metFORMIN (GLUCOPHAGE) 500 MG tablet Take 1 tablet (500 mg total) by mouth daily. 90 tablet 2  . Multiple Vitamins-Minerals (PRESERVISION AREDS 2 PO) Take 1 capsule by mouth 2 (two) times a day.    . naproxen sodium (ALEVE) 220 MG tablet Take 220 mg by mouth daily as needed (pain).    . nitroGLYCERIN (NITROSTAT) 0.4 MG SL tablet Place 1 tablet (0.4 mg total) under the tongue as needed. 25 tablet 3  . ranolazine (RANEXA) 1000 MG SR tablet TAKE 1  BY MOUTH TWICE DAILY (Patient taking differently: Take 1,000 mg by mouth 2 (two) times daily.  ) 180 each 0  . rosuvastatin (CRESTOR) 20 MG tablet Take 1 tablet (20 mg total) by mouth at bedtime. 90 tablet 1  . Lancets (ONETOUCH ULTRASOFT) lancets Use to check blood sugars fasting daily 100 each 12    Results for orders placed or performed during the hospital encounter of 10/20/18 (from the past 48 hour(s))  Glucose, capillary     Status: Abnormal   Collection Time: 10/20/18 10:24 AM  Result Value Ref Range   Glucose-Capillary 108 (H) 70 - 99 mg/dL   No results found.  Review of Systems  Constitutional: Negative.   HENT: Negative.   Cardiovascular: Negative.   Genitourinary: Negative.   Musculoskeletal: Positive for back pain.  Skin: Negative.   Neurological: Positive for tingling.    Blood pressure (!) 117/59, pulse 72, temperature 98.1 F (36.7 C), temperature source Oral, resp. rate 20, height 5\' 7"  (1.702 m), weight 91.2 kg, SpO2 96 %. Physical Exam  Constitutional: He is oriented to person, place, and time. He appears well-developed. No distress.  Eyes: Pupils are equal, round, and reactive to light. EOM are normal.  Respiratory: Breath sounds normal. No respiratory distress.  Musculoskeletal:        General: Tenderness present.  Neurological: He is alert and oriented to person, place, and time.  Skin: Skin is warm and dry.  Psychiatric: He has a normal mood and affect.     Assessment/Plan L3-4 and L4-5 stenosis.   We will proceed with L3-4 and L4-5 decompression as scheduled.  Surgical procedure along with potential  rehab/recovery time discussed.  All questions answered and patient wishes to proceed.  Benjiman Core, PA-C 10/20/2018, 11:59 AM

## 2018-10-21 ENCOUNTER — Encounter (HOSPITAL_COMMUNITY): Payer: Self-pay | Admitting: Orthopaedic Surgery

## 2018-10-21 LAB — CBC
HCT: 34.6 % — ABNORMAL LOW (ref 39.0–52.0)
Hemoglobin: 11.8 g/dL — ABNORMAL LOW (ref 13.0–17.0)
MCH: 31.2 pg (ref 26.0–34.0)
MCHC: 34.1 g/dL (ref 30.0–36.0)
MCV: 91.5 fL (ref 80.0–100.0)
Platelets: 202 10*3/uL (ref 150–400)
RBC: 3.78 MIL/uL — ABNORMAL LOW (ref 4.22–5.81)
RDW: 13.2 % (ref 11.5–15.5)
WBC: 19.1 10*3/uL — ABNORMAL HIGH (ref 4.0–10.5)
nRBC: 0 % (ref 0.0–0.2)

## 2018-10-21 LAB — BASIC METABOLIC PANEL
Anion gap: 12 (ref 5–15)
BUN: 8 mg/dL (ref 8–23)
CO2: 25 mmol/L (ref 22–32)
Calcium: 9.1 mg/dL (ref 8.9–10.3)
Chloride: 101 mmol/L (ref 98–111)
Creatinine, Ser: 0.81 mg/dL (ref 0.61–1.24)
GFR calc Af Amer: >60 mL/min (ref >60)
GFR calc non Af Amer: >60 mL/min (ref >60)
Glucose, Bld: 173 mg/dL — ABNORMAL HIGH (ref 70–99)
Potassium: 4.5 mmol/L (ref 3.5–5.1)
Sodium: 138 mmol/L (ref 135–145)

## 2018-10-21 LAB — GLUCOSE, CAPILLARY
Glucose-Capillary: 127 mg/dL — ABNORMAL HIGH (ref 70–99)
Glucose-Capillary: 112 mg/dL — ABNORMAL HIGH (ref 70–99)
Glucose-Capillary: 130 mg/dL — ABNORMAL HIGH (ref 70–99)
Glucose-Capillary: 156 mg/dL — ABNORMAL HIGH (ref 70–99)

## 2018-10-21 MED ORDER — OXYCODONE-ACETAMINOPHEN 5-325 MG PO TABS: 1.0000 | ORAL_TABLET | Freq: Four times a day (QID) | ORAL | 0 refills | Status: DC | PRN

## 2018-10-21 NOTE — Evaluation (Signed)
Physical Therapy Evaluation Patient Details Name: Jacob Harrison MRN: [id number] DOB: [birth year] Today's Date: 10/21/2018   History of Present Illness  Pt is a 78 y.o. M with significant PMH of CAD, DM2, who presents s/p L3-4, L4-5 decompression with dural repair.  Clinical Impression  Patient is s/p above surgery resulting in the deficits listed below (see PT Problem List). Pt seen post bedrest following dural repair; BP stable pre/post mobility. Pt denies headache. On PT evaluation, pt ambulating 150 feet with Rollator and min guard assist. Reports increased surgical site pain with movement, but no radicular symptoms. Ice applied. Education re: spinal precautions, generalized walking program. Patient will benefit from skilled PT to increase their independence and safety with mobility (while adhering to their precautions) to allow discharge to the venue listed below.     Follow Up Recommendations No PT follow up    Equipment Recommendations  None recommended by PT    Recommendations for Other Services       Precautions / Restrictions Precautions Precautions: Fall;Back Precaution Booklet Issued: Yes (comment) Precaution Comments: Verbally reviewed, provided written handout Restrictions Weight Bearing Restrictions: No      Mobility  Bed Mobility Overal bed mobility: Modified Independent             General bed mobility comments: Instructed on log roll technique  Transfers Overall transfer level: Needs assistance Equipment used: 4-wheeled walker Transfers: Sit to/from Stand Sit to Stand: Supervision            Ambulation/Gait Ambulation/Gait assistance: Min guard Gait Distance (Feet): 150 Feet Assistive device: 4-wheeled walker Gait Pattern/deviations: Step-through pattern;Decreased stride length     General Gait Details: Good posture, increased pain with distance  Stairs            Wheelchair Mobility    Modified Rankin (Stroke Patients  Only)       Balance Overall balance assessment: Mild deficits observed, not formally tested                                           Pertinent Vitals/Pain Pain Assessment: Faces Faces Pain Scale: Hurts whole lot Pain Location: surgical site with movement, sitting up in recliner Pain Descriptors / Indicators: Grimacing;Operative site guarding Pain Intervention(s): Monitored during session;Patient requesting pain meds-RN notified    Home Living Family/patient expects to be discharged to:: Private residence Living Arrangements: Spouse/significant other Available Help at Discharge: Family Type of Home: House Home Access: Stairs to enter   [employer] of Steps: 4 Home Layout: One level Home Equipment: [place] - 4 wheels;Cane - single point;Shower seat      Prior Function Level of Independence: Independent with assistive device(s)         Comments: Using Rollator, limited community ambulator     Hand Dominance        Extremity/Trunk Assessment   Upper Extremity Assessment Upper Extremity Assessment: Overall WFL for tasks assessed    Lower Extremity Assessment Lower Extremity Assessment: Overall WFL for tasks assessed    Cervical / Trunk Assessment Cervical / Trunk Assessment: Other exceptions Cervical / Trunk Exceptions: s/p spinal sx  Communication   Communication: No difficulties  Cognition Arousal/Alertness: Awake/alert Behavior During Therapy: WFL for tasks assessed/performed Overall Cognitive Status: Within Functional Limits for tasks assessed  General Comments      Exercises     Assessment/Plan    PT Assessment Patient needs continued PT services  PT Problem List Decreased activity tolerance;Decreased mobility;Decreased balance;Pain       PT Treatment Interventions DME instruction;Gait training;Stair training;Functional mobility training;Therapeutic  activities;Therapeutic exercise;Balance training;Patient/family education    PT Goals (Current goals can be found in the Care Plan section)  Acute Rehab PT Goals Patient Stated Goal: "Practice stairs before going home." PT Goal Formulation: With patient Time For Goal Achievement: 10/26/18 Potential to Achieve Goals: Good    Frequency Min 5X/week   Barriers to discharge        Co-evaluation               AM-PAC PT "6 Clicks" Mobility  Outcome Measure Help needed turning from your back to your side while in a flat bed without using bedrails?: None Help needed moving from lying on your back to sitting on the side of a flat bed without using bedrails?: None Help needed moving to and from a bed to a chair (including a wheelchair)?: None Help needed standing up from a chair using your arms (e.g., wheelchair or bedside chair)?: None Help needed to walk in hospital room?: A Little Help needed climbing 3-5 steps with a railing? : A Little 6 Click Score: 22    End of Session   Activity Tolerance: Patient tolerated treatment well Patient left: in chair;with call bell/phone within reach Nurse Communication: Mobility status;Patient requests pain meds PT Visit Diagnosis: Pain;Difficulty in walking, not elsewhere classified (R26.2) Pain - part of body: (back)    Time: 8110-3159 PT Time Calculation (min) (ACUTE ONLY): 31 min   Charges:   PT Evaluation $PT Eval Low Complexity: 1 Low PT Treatments $Therapeutic Activity: 8-22 mins        Ellamae Sia, PT, DPT Acute Rehabilitation Services Pager [phone] Office [phone]   Willy Eddy 10/21/2018, 3:30 PM

## 2018-10-21 NOTE — Progress Notes (Signed)
   Subjective: 1 Day Post-Op Procedure(s) (LRB): L3-4, L4-5 LUMBAR DECOMPRESSION with dural repair. (N/A) Patient reports pain as incisional lumbar pain. No leg pain. Leg feel good. No Headache  Objective: Vital signs in last 24 hours: Temp:  [98 F (36.7 C)-98.8 F (37.1 C)] 98.1 F (36.7 C) (07/16 0411) Pulse Rate:  [60-76] 63 (07/16 0411) Resp:  [11-20] 20 (07/16 0411) BP: (102-132)/(55-70) 132/65 (07/16 0411) SpO2:  [92 %-100 %] 96 % (07/16 0411) Weight:  [91.2 kg] 91.2 kg (07/15 1017)  Intake/Output from previous day: 07/15 0701 - 07/16 0700 In: 2460 [P.O.:470; I.V.:1940; IV Piggyback:50] Out: 2725 [Urine:2325; Blood:400] Intake/Output this shift: No intake/output data recorded.  Recent Labs    10/21/18 0510  HGB 11.8*   Recent Labs    10/21/18 0510  WBC 19.1*  RBC 3.78*  HCT 34.6*  PLT 202   Recent Labs    10/21/18 0510  NA 138  K 4.5  CL 101  CO2 25  BUN 8  CREATININE 0.81  GLUCOSE 173*  CALCIUM 9.1   No results for input(s): LABPT, INR in the last 72 hours.  Neurologically intact good strength legs and feet.  Dg Lumbar Spine 2-3 Views  Result Date: 10/20/2018 CLINICAL DATA:  Operative localization. EXAM: LUMBAR SPINE - 2-3 VIEW COMPARISON:  09/22/2017 FINDINGS: Initial film shows needles between the spinous processes of L2 and L3 and L4 and L5. Second film shows clamps directed at the disc levels of L2-3 and L4-5. Last film shows clamps directed at the pedicle levels of L3 and L5. IMPRESSION: Eventual localization of the pedicle levels of L3 and L5. Electronically Signed   By: Nelson Chimes M.D.   On: 10/20/2018 16:10    Assessment/Plan: 1 Day Post-Op Procedure(s) (LRB): L3-4, L4-5 LUMBAR DECOMPRESSION with dural repair. (N/A) Up with therapy this afternoon. Begin HOB elevation at 12 noon. If no headache then begin therapy  Jacob Harrison 10/21/2018, 7:44 AM

## 2018-10-21 NOTE — Progress Notes (Addendum)
Got up and ambulated in the halls. Ready for discharge  In AM. Got cold and clammy ambulating. No angina. Now back in bed doing well. Steps in AM then discharge planned.

## 2018-10-22 ENCOUNTER — Telehealth: Payer: Self-pay

## 2018-10-22 LAB — GLUCOSE, CAPILLARY
Glucose-Capillary: 107 mg/dL — ABNORMAL HIGH (ref 70–99)
Glucose-Capillary: 140 mg/dL — ABNORMAL HIGH (ref 70–99)

## 2018-10-22 NOTE — Telephone Encounter (Signed)
I called , he is post op fill as ordered.

## 2018-10-22 NOTE — Progress Notes (Signed)
Physical Therapy Treatment Patient Details Name: Jacob Harrison MRN: [id number] DOB: [birth year] Today's Date: 10/22/2018    History of Present Illness Pt is a 78 y.o. M with significant PMH of CAD, DM2, who presents s/p L3-4, L4-5 decompression with dural repair.    PT Comments    Pt met the majority of his physical therapy goals during his inpatient stay. Ambulating 500 feet with Rollator without physical difficulty. Negotiated 4 steps with left railing to simulate home set up. Pt recalling 3/3 spinal precautions. Education and demonstration provided re: gentle core stabilization exercises, stretching, car transfer technique, activity modifications and progression. Pt with no further questions/concerns. PT signing off.    Follow Up Recommendations  No PT follow up     Equipment Recommendations  None recommended by PT    Recommendations for Other Services       Precautions / Restrictions Precautions Precautions: Fall;Back Precaution Booklet Issued: Yes (comment) Precaution Comments: Pt recalling 3/3 precautions Restrictions Weight Bearing Restrictions: No    Mobility  Bed Mobility Overal bed mobility: Modified Independent                Transfers Overall transfer level: Modified independent Equipment used: 4-wheeled walker                Ambulation/Gait Ambulation/Gait assistance: Modified independent (Device/Increase time) Gait Distance (Feet): 500 Feet Assistive device: 4-wheeled walker Gait Pattern/deviations: Step-through pattern;Decreased stride length     General Gait Details: TC/VC for scapular retraction, increased bilateral foot clearance. Pt with tendency for foot external rotation   Stairs Stairs: Yes Stairs assistance: Min guard Stair Management: One rail Left Number of Stairs: 4 General stair comments: cues for step by step pattern, sequencing   Wheelchair Mobility    Modified Rankin (Stroke Patients Only)        Balance Overall balance assessment: Mild deficits observed, not formally tested                                          Cognition Arousal/Alertness: Awake/alert Behavior During Therapy: WFL for tasks assessed/performed Overall Cognitive Status: Within Functional Limits for tasks assessed                                        Exercises Other Exercises Other Exercises: Instructed pt on gentle TA contractions, hip extensor and piriformis stretching    General Comments        Pertinent Vitals/Pain Pain Assessment: Faces Faces Pain Scale: Hurts even more Pain Location: surgical site with movement, sitting up in recliner Pain Descriptors / Indicators: Grimacing;Operative site guarding Pain Intervention(s): Monitored during session    Home Living                      Prior Function            PT Goals (current goals can now be found in the care plan section) Acute Rehab PT Goals Patient Stated Goal: "Practice stairs before going home." PT Goal Formulation: With patient Time For Goal Achievement: 10/26/18 Potential to Achieve Goals: Good Progress towards PT goals: Progressing toward goals    Frequency    Min 5X/week      PT Plan Other (comment)(d/c therapies)    Co-evaluation  AM-PAC PT "6 Clicks" Mobility   Outcome Measure  Help needed turning from your back to your side while in a flat bed without using bedrails?: None Help needed moving from lying on your back to sitting on the side of a flat bed without using bedrails?: None Help needed moving to and from a bed to a chair (including a wheelchair)?: None Help needed standing up from a chair using your arms (e.g., wheelchair or bedside chair)?: None Help needed to walk in hospital room?: None Help needed climbing 3-5 steps with a railing? : A Little 6 Click Score: 23    End of Session Equipment Utilized During Treatment: Gait belt Activity  Tolerance: Patient tolerated treatment well Patient left: in chair;with call bell/phone within reach Nurse Communication: Mobility status;Patient requests pain meds PT Visit Diagnosis: Pain;Difficulty in walking, not elsewhere classified (R26.2) Pain - part of body: (back)     Time: 6803-2122 PT Time Calculation (min) (ACUTE ONLY): 22 min  Charges:  $Gait Training: 8-22 mins                     Ellamae Sia, PT, DPT Acute Rehabilitation Services Pager [phone] Office [phone]    Willy Eddy 10/22/2018, 8:50 AM

## 2018-10-22 NOTE — Telephone Encounter (Signed)
Jacob Harrison with [facility] would like to know if directions for Oxycodone could be changed to 1 every 4 hours or 1 every 6 hrs?  Cb# is [phone].  Please advise.  Thank you.

## 2018-10-22 NOTE — Progress Notes (Signed)
   Subjective: 2 Days Post-Op Procedure(s) (LRB): L3-4, L4-5 LUMBAR DECOMPRESSION with dural repair. (N/A) Patient reports pain as moderate at incision." my legs feel stronger "   Objective: Vital signs in last 24 hours: Temp:  [97.8 F (36.6 C)-99.2 F (37.3 C)] 98.1 F (36.7 C) (07/17 0433) Pulse Rate:  [60-66] 62 (07/17 0433) Resp:  [18] 18 (07/17 0433) BP: (107-140)/(53-77) 108/56 (07/17 0433) SpO2:  [95 %-97 %] 97 % (07/17 0433)  Intake/Output from previous day: 07/16 0701 - 07/17 0700 In: 956.6 [P.O.:240; I.V.:716.6] Out: 1950 [ASTMH:9622] Intake/Output this shift: No intake/output data recorded.  Recent Labs    10/21/18 0510  HGB 11.8*   Recent Labs    10/21/18 0510  WBC 19.1*  RBC 3.78*  HCT 34.6*  PLT 202   Recent Labs    10/21/18 0510  NA 138  K 4.5  CL 101  CO2 25  BUN 8  CREATININE 0.81  GLUCOSE 173*  CALCIUM 9.1   No results for input(s): LABPT, INR in the last 72 hours.  Neurologically intact No results found.  Assessment/Plan: 2 Days Post-Op Procedure(s) (LRB): L3-4, L4-5 LUMBAR DECOMPRESSION with dural repair. (N/A) Plan : discharge home. Office one week  Jacob Harrison 10/22/2018, 9:29 AM

## 2018-10-22 NOTE — Plan of Care (Signed)
  Problem: Education: Goal: Ability to verbalize activity precautions or restrictions will improve Outcome: Adequate for Discharge Goal: Knowledge of the prescribed therapeutic regimen will improve Outcome: Adequate for Discharge Goal: Understanding of discharge needs will improve Outcome: Adequate for Discharge   Problem: Activity: Goal: Ability to avoid complications of mobility impairment will improve Outcome: Adequate for Discharge Goal: Ability to tolerate increased activity will improve Outcome: Adequate for Discharge Goal: Will remain free from falls Outcome: Adequate for Discharge   Problem: Bowel/Gastric: Goal: Gastrointestinal status for postoperative course will improve Outcome: Adequate for Discharge   Problem: Clinical Measurements: Goal: Ability to maintain clinical measurements within normal limits will improve Outcome: Adequate for Discharge Goal: Postoperative complications will be avoided or minimized Outcome: Adequate for Discharge Goal: Diagnostic test results will improve Outcome: Adequate for Discharge   Problem: Pain Management: Goal: Pain level will decrease Outcome: Adequate for Discharge   Problem: Skin Integrity: Goal: Will show signs of wound healing Outcome: Adequate for Discharge   Problem: Health Behavior/Discharge Planning: Goal: Identification of resources available to assist in meeting health care needs will improve Outcome: Adequate for Discharge   Problem: Bladder/Genitourinary: Goal: Urinary functional status for postoperative course will improve Outcome: Adequate for Discharge   Problem: Education: Goal: Knowledge of General Education information will improve Description: Including pain rating scale, medication(s)/side effects and non-pharmacologic comfort measures Outcome: Adequate for Discharge   Problem: Health Behavior/Discharge Planning: Goal: Ability to manage health-related needs will improve Outcome: Adequate for  Discharge   Problem: Clinical Measurements: Goal: Ability to maintain clinical measurements within normal limits will improve Outcome: Adequate for Discharge Goal: Will remain free from infection Outcome: Adequate for Discharge Goal: Diagnostic test results will improve Outcome: Adequate for Discharge Goal: Respiratory complications will improve Outcome: Adequate for Discharge Goal: Cardiovascular complication will be avoided Outcome: Adequate for Discharge   Problem: Activity: Goal: Risk for activity intolerance will decrease Outcome: Adequate for Discharge   Problem: Nutrition: Goal: Adequate nutrition will be maintained Outcome: Adequate for Discharge   Problem: Coping: Goal: Level of anxiety will decrease Outcome: Adequate for Discharge   Problem: Elimination: Goal: Will not experience complications related to bowel motility Outcome: Adequate for Discharge Goal: Will not experience complications related to urinary retention Outcome: Adequate for Discharge   Problem: Pain Managment: Goal: General experience of comfort will improve Outcome: Adequate for Discharge   Problem: Safety: Goal: Ability to remain free from injury will improve Outcome: Adequate for Discharge   Problem: Skin Integrity: Goal: Risk for impaired skin integrity will decrease Outcome: Adequate for Discharge   Problem: Acute Rehab PT Goals(only PT should resolve) Goal: Pt Will Go Up/Down Stairs Outcome: Adequate for Discharge

## 2018-10-27 ENCOUNTER — Telehealth: Payer: Self-pay

## 2018-10-27 DIAGNOSIS — I25118 Atherosclerotic heart disease of native coronary artery with other forms of angina pectoris: Secondary | ICD-10-CM

## 2018-10-27 MED ORDER — RANOLAZINE ER 1000 MG PO TB12: ORAL_TABLET | ORAL | 0 refills | Status: DC

## 2018-10-27 NOTE — Telephone Encounter (Signed)
Requested Prescriptions   Signed Prescriptions Disp Refills  . ranolazine (RANEXA) 1000 MG SR tablet 180 tablet 0    Sig: TAKE 1 TABLET BY MOUTH TWICE DAILY    Authorizing Provider: END, CHRISTOPHER    Ordering User: NEWCOMER MCCLAIN, Hulbert Branscome L

## 2018-10-29 ENCOUNTER — Ambulatory Visit (INDEPENDENT_AMBULATORY_CARE_PROVIDER_SITE_OTHER): Payer: Medicare Other | Admitting: Orthopaedic Surgery

## 2018-10-29 ENCOUNTER — Encounter: Payer: Self-pay | Admitting: Orthopaedic Surgery

## 2018-10-29 DIAGNOSIS — Z9889 Other specified postprocedural states: Secondary | ICD-10-CM

## 2018-10-29 MED ORDER — OXYCODONE-ACETAMINOPHEN 5-325 MG PO TABS: 1.0000 | ORAL_TABLET | Freq: Three times a day (TID) | ORAL | 0 refills | Status: DC | PRN

## 2018-10-29 NOTE — Progress Notes (Signed)
Post-Op Visit Note   Patient: Jacob Harrison           Date of Birth: Oct 24, 1940           MRN: [id number] Visit Date: 10/29/2018 PCP: Esaw Grandchild, NP   Assessment & Plan: Postop two-level lumbar decompression L3-4 L4-5 with dural repair.  Incision looks good he is walking better improved strength in his legs.  He still using his rolling walker which he was using prior to the surgery.  Incisions healed.  He will return in 1 week for staple removal Steri-Strip application.  He can follow-up with Dr. Lorin Mercy a month later.  Chief Complaint:  Chief Complaint  Patient presents with  . Lower Back - Routine Post Op    10/20/2018 L3-4, L4-5 lumbar decompression with dural repair   Visit Diagnoses:  1. Status post lumbar spine surgery for decompression of spinal cord     Plan: Return in 1 week for nurse visit staple removal and Steri-Strip application and follow-up with Dr. Lorin Mercy the following month.  Follow-Up Instructions: Return in about 1 week (around 11/05/2018), or nurse visit staple removal and steri strips.   Orders:  No orders of the defined types were placed in this encounter.  No orders of the defined types were placed in this encounter.   Imaging: No results found.  PMFS History: Patient Active Problem List   Diagnosis Date Noted  . 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 Encounter for Medicare annual wellness exam 08/17/2018  . Spinal stenosis of lumbar region 08/02/2018  . Flu-like symptoms 05/05/2018  . Bacterial conjunctivitis of both eyes 04/27/2018  . Atypical mole 07/30/2017  . Healthcare maintenance 07/30/2017  . Wheezing 07/21/2017  . Acute bronchitis 07/21/2017  . Neck pain 06/08/2017  . Sinus bradycardia 03/18/2017  . Screening for colon cancer 02/25/2017  . Ventral hernia without obstruction or gangrene 02/25/2017  . Chronic heart failure with preserved ejection fraction ([place]) 09/28/2016  . Ischemic  cardiomyopathy 09/28/2016  . Cough in adult 07/22/2016  . Vitamin D deficiency 06/27/2016  . Vitamin B deficiency 06/27/2016  . Hyperlipidemia LDL goal <70 06/21/2016  . Weakness of both lower extremities 06/21/2016  . Diabetes mellitus without complication ([place]) 87/56/4332  . Essential hypertension 05/20/2016  . Hyperlipidemia associated with type 2 diabetes mellitus ([place]) 05/20/2016  . Coronary artery disease involving native heart with unstable angina pectoris ([place]) 05/20/2016   Past Medical History:  Diagnosis Date  . (HFpEF) heart failure with preserved ejection fraction ([place])    a. 10/2016 Echo: EF 65-70%, mild LVH, mildly dil LA. RV fxn nl.  . Arthritis   . BPH (benign prostatic hyperplasia)   . Coronary artery disease    a. 2001 CABG x 3 [facility]): LIMA->LAD, VG->LCX, VG->RPDA; b. 04/05/2010 Cath ([facility]): LM 75m, LAD 143m, LCX 29m, RCA 100p, LIMA->LAD atretic, VG->LCX 100, VG->RCA 80ost, 70d; c. 04/2010 Redo CABG x 2 ([facility]): VG->LAD, VG->RPDA; d. 10/2012 Cath [facility]): LM 20-30, LAD 100/95-75m, LCX 40-57m, RCA 100p, 80-100d, RPDA 99, RPL1 95, VG->LAD ok, VG->RPDA ok, EF 67%; e. 11/2016 MV: EF 48%, No ischemia.  . 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 Hyperlipidemia   . Hypertension   . Scoliosis   . Spinal stenosis     Family History  Problem Relation Age of Onset  . Diabetes Mother   . Hyperlipidemia Mother   . Cancer Father  colon  . Alcohol abuse Maternal Uncle   . Diabetes Maternal Uncle   . Hyperlipidemia Maternal Uncle   . Cancer Paternal Uncle        colon    Past Surgical History:  Procedure Laterality Date  . CARDIAC CATHETERIZATION    . CORONARY ARTERY BYPASS GRAFT     x 2 (2001 and redo in late 2011 or early 2012)  . HERNIA REPAIR Left    inguinal  . LUMBAR LAMINECTOMY/DECOMPRESSION MICRODISCECTOMY N/A 10/20/2018   Procedure: L3-4, L4-5 LUMBAR DECOMPRESSION with dural repair.;  Surgeon: Marybelle Killings, MD;  Location: [place];   Service: Orthopedics;  Laterality: N/A;  . TONSILLECTOMY AND ADENOIDECTOMY    . TRANSURETHRAL RESECTION OF PROSTATE  1990   Social History   Occupational History  . Not on file  Tobacco Use  . Smoking status: Never Smoker  . Smokeless tobacco: Never Used  Substance and Sexual Activity  . Alcohol use: Yes    Alcohol/week: 11.0 standard drinks    Types: 5 Glasses of wine, 6 Cans of beer per week  . Drug use: No  . Sexual activity: Not Currently    Birth control/protection: None

## 2018-10-29 NOTE — Addendum Note (Signed)
Addended by: Marybelle Killings on: 10/29/2018 01:54 PM   Modules accepted: Orders

## 2018-11-04 NOTE — Discharge Summary (Signed)
Patient ID: Cordarrell Sane MRN: [id number] DOB/AGE: 1940/11/16 78 y.o.  Admit date: 10/20/2018 Discharge date: 11/04/2018  Admission Diagnoses:  Active Problems:   * No active hospital problems. *   Discharge Diagnoses:  Active Problems:   * No active hospital problems. *  status post Procedure(s): L3-4, L4-5 LUMBAR DECOMPRESSION with dural repair.  Past Medical History:  Diagnosis Date  . (HFpEF) heart failure with preserved ejection fraction ([place])    a. 10/2016 Echo: EF 65-70%, mild LVH, mildly dil LA. RV fxn nl.  . Arthritis   . BPH (benign prostatic hyperplasia)   . Coronary artery disease    a. 2001 CABG x 3 [facility]): LIMA->LAD, VG->LCX, VG->RPDA; b. 04/05/2010 Cath ([facility]): LM 79m, LAD 12m, LCX 57m, RCA 100p, LIMA->LAD atretic, VG->LCX 100, VG->RCA 80ost, 70d; c. 04/2010 Redo CABG x 2 ([facility]): VG->LAD, VG->RPDA; d. 10/2012 Cath [facility]): LM 20-30, LAD 100/95-41m, LCX 40-82m, RCA 100p, 80-100d, RPDA 99, RPL1 95, VG->LAD ok, VG->RPDA ok, EF 67%; e. 11/2016 MV: EF 48%, No ischemia.  . 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 Hyperlipidemia   . Hypertension   . Scoliosis   . Spinal stenosis     Surgeries: Procedure(s): L3-4, L4-5 LUMBAR DECOMPRESSION with dural repair. on 10/20/2018   Consultants:   Discharged Condition: Improved  Hospital Course: Saamir Armstrong is an 78 y.o. male who was admitted 10/20/2018 for operative treatment of lumbar stenosis. Patient failed conservative treatments (please see the history and physical for the specifics) and had severe unremitting pain that affects sleep, daily activities and work/hobbies. After pre-op clearance, the patient was taken to the operating room on 10/20/2018 and underwent  Procedure(s): L3-4, L4-5 LUMBAR DECOMPRESSION with dural repair..    Patient was given perioperative antibiotics:  Anti-infectives (From admission, onward)   Start     Dose/Rate Route Frequency Ordered Stop   10/20/18 2100  ceFAZolin (ANCEF) IVPB 1 g/50 mL premix     1 g 100 mL/hr over 30 Minutes Intravenous Every 8 hours 10/20/18 1721 10/21/18 0441   10/20/18 1018  ceFAZolin (ANCEF) 2-4 GM/100ML-% IVPB    Note to Pharmacy: Jasmine Pang   : cabinet override      10/20/18 1018 10/20/18 1256   10/20/18 1015  ceFAZolin (ANCEF) IVPB 2g/100 mL premix     2 g 200 mL/hr over 30 Minutes Intravenous On call to O.R. 10/20/18 1010 10/20/18 1311       Patient was given sequential compression devices and early ambulation to prevent DVT.   Patient benefited maximally from hospital stay and there were no complications. At the time of discharge, the patient was urinating/moving their bowels without difficulty, tolerating a regular diet, pain is controlled with oral pain medications and they have been cleared by PT/OT.   Recent vital signs: No data found.   Recent laboratory studies: No results for input(s): WBC, HGB, HCT, PLT, NA, K, CL, CO2, BUN, CREATININE, GLUCOSE, INR, CALCIUM in the last 72 hours.  Invalid input(s): PT, 2   Discharge Medications:   Allergies as of 10/22/2018      Reactions   Isosorbide Mononitrate [isosorbide Dinitrate Er]    Headaches   Tetracyclines & Related Rash      Medication List    STOP taking these medications   naproxen sodium 220 MG tablet Commonly known as: ALEVE     TAKE these medications   amLODipine 2.5 MG tablet Commonly known as: NORVASC Take 1 tablet (2.5 mg  total) by mouth daily.   aspirin EC 81 MG tablet Take 1 tablet (81 mg total) by mouth daily.   carvedilol 6.25 MG tablet Commonly known as: COREG Take 1 tablet by mouth twice daily What changed: when to take this   Co Q-10 200 MG Caps Take 200 mg by mouth daily.   ezetimibe 10 MG tablet Commonly known as: ZETIA Take 1 tablet by mouth once daily   finasteride 5 MG tablet Commonly known as: PROSCAR Take 1 tablet by mouth once daily   Glucosamine 1500 Complex Caps Take 1 capsule by  mouth daily.   metFORMIN 500 MG tablet Commonly known as: GLUCOPHAGE Take 1 tablet (500 mg total) by mouth daily.   nitroGLYCERIN 0.4 MG SL tablet Commonly known as: NITROSTAT Place 1 tablet (0.4 mg total) under the tongue as needed.   onetouch ultrasoft lancets Use to check blood sugars fasting daily   oxyCODONE-acetaminophen 5-325 MG tablet Commonly known as: Percocet Take 1-2 tablets by mouth every 6 (six) hours as needed for severe pain.   PRESERVISION AREDS 2 PO Take 1 capsule by mouth 2 (two) times a day.   rosuvastatin 20 MG tablet Commonly known as: CRESTOR Take 1 tablet (20 mg total) by mouth at bedtime.   vitamin B-12 500 MCG tablet Commonly known as: CYANOCOBALAMIN Take 500 mcg by mouth daily.   VITAMIN D-3 PO Take 800 Units by mouth daily.       Diagnostic Studies: Dg Chest 2 View  Result Date: 10/13/2018 CLINICAL DATA:  Preoperative, no chest complaints EXAM: CHEST - 2 VIEW COMPARISON:  07/21/2017 FINDINGS: Mild cardiomegaly status post median sternotomy and CABG. Unchanged scarring or atelectasis of the left lung base with chronic appearing pleural effusion and/or pleural thickening. No acute appearing airspace opacity. Disc degenerative disease of the thoracic spine. IMPRESSION: Mild cardiomegaly status post median sternotomy and CABG. Unchanged scarring or atelectasis of the left lung base with chronic appearing pleural effusion and/or pleural thickening. No acute appearing airspace opacity. Electronically Signed   By: Eddie Candle M.D.   On: 10/13/2018 15:20   Dg Lumbar Spine 2-3 Views  Result Date: 10/20/2018 CLINICAL DATA:  Operative localization. EXAM: LUMBAR SPINE - 2-3 VIEW COMPARISON:  09/22/2017 FINDINGS: Initial film shows needles between the spinous processes of L2 and L3 and L4 and L5. Second film shows clamps directed at the disc levels of L2-3 and L4-5. Last film shows clamps directed at the pedicle levels of L3 and L5. IMPRESSION: Eventual  localization of the pedicle levels of L3 and L5. Electronically Signed   By: Nelson Chimes M.D.   On: 10/20/2018 16:10      Follow-up Information    Marybelle Killings, MD Follow up in 1 week(s).   Specialty: Orthopedic Surgery Contact information: [place] [address] [phone]           Discharge Plan:  discharge to home  Disposition:     Signed: Benjiman Core  11/04/2018, 8:45 AM

## 2018-11-05 ENCOUNTER — Ambulatory Visit: Payer: Medicare Other

## 2018-11-05 ENCOUNTER — Other Ambulatory Visit: Payer: Self-pay

## 2018-11-09 ENCOUNTER — Telehealth: Payer: Self-pay | Admitting: Adult Health

## 2018-11-09 NOTE — Telephone Encounter (Signed)
Pt states that he was on oxycodone following back surgery and has been experiencing constipation since that time.  He stopped oxycodone on 11/05/2018 but has not had a BM since 11/03/2018.  He has been taking stool softeners and using Metamucil without relief.  Advised pt that he may use an enema initially and use Miralax daily.  Advised pt that if he does not see improvement with BMs within the next few days, he needs to call our office back.  Pt expressed understanding and is agreeable.  Charyl Bigger, CMA

## 2018-11-09 NOTE — Telephone Encounter (Signed)
Patient called and left VM stating that he had back surgery in July and has been dealing with constipation since, he wants to speak with clinic staff about what he can do to help resolve this. Contact on mobile phone

## 2018-11-23 ENCOUNTER — Encounter: Payer: Self-pay | Admitting: Orthopaedic Surgery

## 2018-11-23 ENCOUNTER — Ambulatory Visit (INDEPENDENT_AMBULATORY_CARE_PROVIDER_SITE_OTHER): Payer: Medicare Other | Admitting: Orthopaedic Surgery

## 2018-11-23 VITALS — BP 140/78 | HR 61 | Ht 67.0 in | Wt 200.0 lb

## 2018-11-23 DIAGNOSIS — Z9889 Other specified postprocedural states: Secondary | ICD-10-CM

## 2018-11-23 NOTE — Progress Notes (Signed)
Patient returns post two-level lumbar decompression L3-4 L4-5.  He states his legs feel much better than it did before surgery and he is using a cane until he gets all his low stamina back.  Has slight prominence the midportion of the incision without drainage staples were removed incision looks good.  We discussed some the soft tissue swelling that will occur with fluid and this should resolve with time.  He will continue to work on increasing his distance increasing his leg strength.  He does have some coronary artery disease.  I will check him back again on an apparent basis and is happy with the results of the surgical decompression with improved ambulation and decrease pain and improve leg sensation.

## 2018-11-24 ENCOUNTER — Other Ambulatory Visit: Payer: Self-pay

## 2018-11-24 ENCOUNTER — Other Ambulatory Visit: Payer: Self-pay | Admitting: Adult Health

## 2018-11-24 MED ORDER — CARVEDILOL 6.25 MG PO TABS: 6.2500 mg | ORAL_TABLET | Freq: Two times a day (BID) | ORAL | 0 refills | Status: DC

## 2018-11-24 NOTE — Telephone Encounter (Signed)
Patient is requesting a refill of his finasteride, if approved please send to [facility] in [place].

## 2018-11-24 NOTE — Addendum Note (Signed)
Addended by: Fonnie Mu on: 11/24/2018 10:57 AM   Modules accepted: Orders

## 2018-11-24 NOTE — Telephone Encounter (Signed)
Morning Tonya, He had TeleMedicine OV 05/2018 Medicare Wellness OV 08/2018 Fasting labs 10/2018 Please have him schedule TeleMedicine appt ASAP Then I will send in refill Thanks! Valetta Fuller

## 2018-11-24 NOTE — Telephone Encounter (Signed)
Does pt need chronic follow up?  Last OV was medicare wellness and follow up only documented as 1 year for medicare wellness.  Please review refill request and authorize if appropriate.  Charyl Bigger, CMA

## 2018-11-24 NOTE — Telephone Encounter (Signed)
*  STAT* If patient is at the pharmacy, call can be transferred to refill team.   1. Which medications need to be refilled? (please list name of each medication and dose if known) Coreg  2. Which pharmacy/location (including street and city if local pharmacy) is medication to be sent to? [place]  3. Do they need a 30 day or 90 day supply? [place]

## 2018-11-24 NOTE — Telephone Encounter (Signed)
Please call pt to schedule telemedicine visit for follow up for chronic condition.  After visit is scheduled, please return message to me to address refill request.  Charyl Bigger, CMA

## 2018-11-29 ENCOUNTER — Encounter: Payer: Self-pay | Admitting: Adult Health

## 2018-11-29 ENCOUNTER — Ambulatory Visit (INDEPENDENT_AMBULATORY_CARE_PROVIDER_SITE_OTHER): Payer: Medicare Other | Admitting: Adult Health

## 2018-11-29 ENCOUNTER — Other Ambulatory Visit: Payer: Self-pay

## 2018-11-29 DIAGNOSIS — R351 Nocturia: Secondary | ICD-10-CM

## 2018-11-29 DIAGNOSIS — N401 Enlarged prostate with lower urinary tract symptoms: Secondary | ICD-10-CM

## 2018-11-29 DIAGNOSIS — E785 Hyperlipidemia, unspecified: Secondary | ICD-10-CM | POA: Diagnosis not present

## 2018-11-29 DIAGNOSIS — Z0000 Encounter for general adult medical examination without abnormal findings: Secondary | ICD-10-CM | POA: Diagnosis not present

## 2018-11-29 MED ORDER — FINASTERIDE 5 MG PO TABS: 5.0000 mg | ORAL_TABLET | Freq: Every day | ORAL | 0 refills | Status: DC

## 2018-11-29 NOTE — Assessment & Plan Note (Signed)
08/10/2018- Lipid Panel LDL-62  Currently on Rosuvastatin 20mg  QD, Zetia 10mg  QD, Coenzyme Q10 200mg  QD

## 2018-11-29 NOTE — Assessment & Plan Note (Signed)
Assessment and Plan: Continue all medications as directed Remain well hydrated, follow diabetic diet Increase activity as directed by Dr. Lorin Mercy Continue to social distance and wear a mask when in public F/u with Ortho, Cards as directed   Follow Up Instructions: A1c ASAP F/u OV 3 months   I discussed the assessment and treatment plan with the patient. The patient was provided an opportunity to ask questions and all were answered. The patient agreed with the plan and demonstrated an understanding of the instructions.   The patient was advised to call back or seek an in-person evaluation if the symptoms worsen or if the condition fails to improve as anticipated.

## 2018-11-29 NOTE — Assessment & Plan Note (Signed)
>>  ASSESSMENT AND PLAN FOR MIXED HYPERLIPIDEMIA WRITTEN ON 11/29/2018  4:56 PM BY DANFORD, KATY D, NP  08/10/2018- Lipid Panel LDL-62  Currently on Rosuvastatin 20mg  QD, Zetia 10mg  QD, Coenzyme Q10 200mg  QD

## 2018-11-29 NOTE — Progress Notes (Signed)
Virtual Visit via Telephone Note  I connected with Jacob Harrison on 11/29/18 at  4:15 PM EDT by telephone and verified that I am speaking with the correct person using two identifiers.  Location: Patient: Home Provider: In Clinic   I discussed the limitations, risks, security and privacy concerns of performing an evaluation and management service by telephone and the availability of in person appointments. I also discussed with the patient that there may be a patient responsible charge related to this service. The patient expressed understanding and agreed to proceed.   History of Present Illness: 05/19/2018 OV: Jacob Harrison is here for f/u: HTN, T2D, HLD He reports AM BS 140s He denies episodes of hypoglycemia He is unable to exercise due to spinal stenosis and r/t bil lower ext numbness/weakness He has been receiving back injections Q3M that is not providing much relief- surgery this spring once cardiac clearance completed. He has f/u with Cardiologist April 2020 HR 40-70s, he again denies any acute cardiac sx's with bradycardia- advised to mention occasional low HR to Cardiologist  He reports medication compliance, denies SE He reports resolution (with exception of occasional residual non-productive) of flu sx's- great! 11/29/2018 OV: Jacob Harrison calls in today for regular f/u: HTN, T2D, HLD He reports AM BS 110-120 He denies episodes of hypoglycemia   Cardiology/Dr. End- is managing his BP-  F/u-03/09/2019 Ambulatory readings- SBP 120-130 DBP 60-70 HR 60-70s He denies acute cardiac sx's  08/10/2018- Lipid Panel LDL-62  10/20/2018 Successful L3-4 L4-5 Lumbar Decompression F/u with Dr. Lorin Mercy 11/23/2018, slowly increasing activity   Needs A1c Lab Results  Component Value Date   HGBA1C 5.8 (H) 08/10/2018   HGBA1C 6.2 (A) 05/19/2018   HGBA1C 5.5 02/03/2018    Patient Care Team    Relationship Specialty Notifications Start End  Esaw Grandchild, NP PCP - General Family  Medicine  05/20/16   Nelva Bush, MD PCP - Cardiology Cardiology Admissions 10/28/17     Patient Active Problem List   Diagnosis Date Noted  . BPH associated with nocturia 11/29/2018  . 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 Encounter for Medicare annual wellness exam 08/17/2018  . Spinal stenosis of lumbar region 08/02/2018  . Flu-like symptoms 05/05/2018  . Bacterial conjunctivitis of both eyes 04/27/2018  . Atypical mole 07/30/2017  . Healthcare maintenance 07/30/2017  . Wheezing 07/21/2017  . Acute bronchitis 07/21/2017  . Neck pain 06/08/2017  . Sinus bradycardia 03/18/2017  . Screening for colon cancer 02/25/2017  . Ventral hernia without obstruction or gangrene 02/25/2017  . Chronic heart failure with preserved ejection fraction ([place]) 09/28/2016  . Ischemic cardiomyopathy 09/28/2016  . Cough in adult 07/22/2016  . Vitamin D deficiency 06/27/2016  . Vitamin B deficiency 06/27/2016  . Hyperlipidemia LDL goal <70 06/21/2016  . Weakness of both lower extremities 06/21/2016  . Diabetes mellitus without complication ([place]) [id number]  . Essential hypertension 05/20/2016  . Hyperlipidemia associated with type 2 diabetes mellitus ([place]) 05/20/2016  . 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 (HFpEF) heart failure with preserved ejection fraction ([place])    a. 10/2016 Echo: EF 65-70%, mild LVH, mildly dil LA. RV fxn nl.  . Arthritis   . BPH (benign prostatic hyperplasia)   . Coronary artery disease    a. 2001 CABG x 3 [facility]): LIMA->LAD, VG->LCX, VG->RPDA; b. 04/05/2010 Cath ([place]): LM 36m, LAD 152m,  LCX 54m, RCA 100p, LIMA->LAD atretic, VG->LCX 100, VG->RCA 80ost, 70d; c. 04/2010 Redo CABG x 2 ([place]): VG->LAD, VG->RPDA; d. 10/2012 Cath [facility]): LM 20-30, LAD 100/95-61m, LCX 40-44m, RCA 100p, 80-100d, RPDA 99, RPL1 95, VG->LAD ok,  VG->RPDA ok, EF 67%; e. 11/2016 MV: EF 48%, No ischemia.  . 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 Hyperlipidemia   . Hypertension   . Scoliosis   . Spinal stenosis      Past Surgical History:  Procedure Laterality Date  . CARDIAC CATHETERIZATION    . CORONARY ARTERY BYPASS GRAFT     x 2 (2001 and redo in late 2011 or early 2012)  . HERNIA REPAIR Left    inguinal  . LUMBAR LAMINECTOMY/DECOMPRESSION MICRODISCECTOMY N/A 10/20/2018   Procedure: L3-4, L4-5 LUMBAR DECOMPRESSION with dural repair.;  Surgeon: Marybelle Killings, MD;  Location: [place];  Service: Orthopedics;  Laterality: N/A;  . TONSILLECTOMY AND ADENOIDECTOMY    . TRANSURETHRAL RESECTION OF PROSTATE  1990     Family History  Problem Relation Age of Onset  . Diabetes Mother   . Hyperlipidemia Mother   . Cancer Father        colon  . Alcohol abuse Maternal Uncle   . Diabetes Maternal Uncle   . Hyperlipidemia Maternal Uncle   . Cancer Paternal Uncle        colon     Social History   Substance and Sexual Activity  Drug Use No     Social History   Substance and Sexual Activity  Alcohol Use Yes  . Alcohol/week: 11.0 standard drinks  . Types: 5 Glasses of wine, 6 Cans of beer per week     Social History   Tobacco Use  Smoking Status Never Smoker  Smokeless Tobacco Never Used     Outpatient Encounter Medications as of 11/29/2018  Medication Sig Note  . amLODipine (NORVASC) 2.5 MG tablet Take 1 tablet (2.5 mg total) by mouth daily.   Marland Kitchen aspirin EC 81 MG tablet Take 1 tablet (81 mg total) by mouth daily.   . carvedilol (COREG) 6.25 MG tablet Take 1 tablet (6.25 mg total) by mouth 2 (two) times daily.   . Cholecalciferol (VITAMIN D-3 PO) Take 800 Units by mouth daily.   . Coenzyme Q10 (CO Q-10) 200 MG CAPS Take 200 mg by mouth daily.    . cyanocobalamin 500 MCG tablet Take 500 mcg by mouth daily.   Marland Kitchen ezetimibe (ZETIA) 10 MG tablet Take 1 tablet by mouth once daily (Patient taking  differently: Take 10 mg by mouth daily. )   . finasteride (PROSCAR) 5 MG tablet Take 1 tablet (5 mg total) by mouth daily.   . Glucosamine-Chondroit-Vit C-Mn (GLUCOSAMINE 1500 COMPLEX) CAPS Take 1 capsule by mouth daily.   . Lancets (ONETOUCH ULTRASOFT) lancets Use to check blood sugars fasting daily   . metFORMIN (GLUCOPHAGE) 500 MG tablet Take 1 tablet (500 mg total) by mouth daily.   . Multiple Vitamins-Minerals (PRESERVISION AREDS 2 PO) Take 1 capsule by mouth 2 (two) times a day.   . nitroGLYCERIN (NITROSTAT) 0.4 MG SL tablet Place 1 tablet (0.4 mg total) under the tongue as needed. 10/20/2018: Hasnt needed   . ranolazine (RANEXA) 1000 MG SR tablet TAKE 1 TABLET BY MOUTH TWICE DAILY   . rosuvastatin (CRESTOR) 20 MG tablet Take 1 tablet (20 mg total) by mouth at bedtime.   . [DISCONTINUED] finasteride (PROSCAR) 5 MG tablet Take 1 tablet by  mouth once daily (Patient taking differently: Take 5 mg by mouth daily. )   . [DISCONTINUED] oxyCODONE-acetaminophen (PERCOCET) 5-325 MG tablet Take 1-2 tablets by mouth every 6 (six) hours as needed for severe pain.   . [DISCONTINUED] oxyCODONE-acetaminophen (PERCOCET/ROXICET) 5-325 MG tablet Take 1 tablet by mouth every 8 (eight) hours as needed for severe pain.    Facility-Administered Encounter Medications as of 11/29/2018  Medication  . ipratropium-albuterol (DUONEB) 0.5-2.5 (3) MG/3ML nebulizer solution 3 mL    Allergies: Isosorbide mononitrate [isosorbide dinitrate er] and Tetracyclines & related  Body mass index is 31.32 kg/m.  Blood pressure 122/70, pulse 61, temperature 98.7 F (37.1 C), temperature source Oral, height 5\' 7"  (1.702 m), weight 200 lb (90.7 kg). Review of Systems: General:   Denies fever, chills, unexplained weight loss.  Optho/Auditory:   Denies visual changes, blurred vision/LOV Respiratory:   Denies SOB, DOE more than baseline levels.  Cardiovascular:   Denies chest pain, palpitations, new onset peripheral edema   Gastrointestinal:   Denies nausea, vomiting, diarrhea.  Genitourinary: Denies dysuria, freq/ urgency, flank pain or discharge from genitals.  Endocrine:     Denies hot or cold intolerance, polyuria, polydipsia. Musculoskeletal:   Denies unexplained myalgias, joint swelling, unexplained arthralgias, gait problems.  Skin:  Denies rash, suspicious lesions Neurological:     Denies dizziness, unexplained weakness, numbness  Psychiatric/Behavioral:   Denies mood changes, suicidal or homicidal ideations, hallucinations  Observations/Objective: No acute distress noted during the telephone conversation  Assessment and Plan: Continue all medications as directed Remain well hydrated, follow diabetic diet Increase activity as directed by Dr. Lorin Mercy Continue to social distance and wear a mask when in public F/u with Ortho, Cards as directed   Follow Up Instructions: A1c ASAP F/u OV 3 months   I discussed the assessment and treatment plan with the patient. The patient was provided an opportunity to ask questions and all were answered. The patient agreed with the plan and demonstrated an understanding of the instructions.   The patient was advised to call back or seek an in-person evaluation if the symptoms worsen or if the condition fails to improve as anticipated.  I provided 12 minutes of non-face-to-face time during this encounter.   Esaw Grandchild, NP

## 2018-11-29 NOTE — Assessment & Plan Note (Signed)
Finasteride 5mg  QD BP stable

## 2018-11-30 ENCOUNTER — Ambulatory Visit: Payer: Medicare Other | Admitting: Adult Health

## 2018-12-01 ENCOUNTER — Other Ambulatory Visit: Payer: Self-pay

## 2018-12-01 ENCOUNTER — Other Ambulatory Visit: Payer: Medicare Other

## 2018-12-01 DIAGNOSIS — E119 Type 2 diabetes mellitus without complications: Secondary | ICD-10-CM

## 2018-12-02 ENCOUNTER — Other Ambulatory Visit: Payer: Self-pay | Admitting: Adult Health

## 2018-12-02 LAB — HEMOGLOBIN A1C
Est. average glucose Bld gHb Est-mCnc: 103 mg/dL
Hgb A1c MFr Bld: 5.2 % (ref 4.8–5.6)

## 2018-12-02 MED ORDER — METFORMIN HCL 500 MG PO TABS: ORAL_TABLET | ORAL | 2 refills | Status: DC

## 2018-12-06 ENCOUNTER — Other Ambulatory Visit: Payer: Self-pay | Admitting: Adult Health

## 2019-01-24 ENCOUNTER — Other Ambulatory Visit: Payer: Self-pay | Admitting: Internal Medicine

## 2019-01-24 DIAGNOSIS — I25118 Atherosclerotic heart disease of native coronary artery with other forms of angina pectoris: Secondary | ICD-10-CM

## 2019-02-14 ENCOUNTER — Other Ambulatory Visit: Payer: Self-pay | Admitting: Adult Health

## 2019-02-16 ENCOUNTER — Other Ambulatory Visit: Payer: Self-pay | Admitting: Adult Health

## 2019-02-19 ENCOUNTER — Other Ambulatory Visit: Payer: Self-pay | Admitting: Internal Medicine

## 2019-02-21 NOTE — Progress Notes (Signed)
Subjective:    Patient ID: Jacob Harrison, male    DOB: 03-29-1941, 78 y.o.   MRN: [id number]  HPI:   05/19/2018 OV: Jacob Harrison is here for f/u: HTN, T2D, HLD He reports AM BS 140s He denies episodes of hypoglycemia He is unable to exercise due to spinal stenosis and r/t bil lower ext numbness/weakness He has been receiving back injections Q3M that is not providing much relief- surgery this spring once cardiac clearance completed. He has f/u with Cardiologist April 2020 HR 40-70s, he again denies any acute cardiac sx's with bradycardia- advised to mention occasional low HR to Cardiologist  He reports medication compliance, denies SE He reports resolution (with exception of occasional residual non-productive) of flu sx's- great! 11/29/2018 OV: Jacob Harrison calls in today for regular f/u: HTN, T2D, HLD He reports AM BS 110-120 He denies episodes of hypoglycemia  Cardiology/Dr. End- is managing his BP-  F/u-03/09/2019 Ambulatory readings- SBP 120-130 DBP 60-70 HR 60-70s He denies acute cardiac sx's  08/10/2018- Lipid Panel LDL-62  10/20/2018 Successful L3-4 L4-5 Lumbar Decompression F/u with Dr. Lorin Mercy 11/23/2018, slowly increasing activity   02/22/2019 OV: Jacob Harrison is here for 3 month f/u: HTN, T2D, HLD AM BG 100-130s He denies episodes of hypoglycemia He is currently taking Metformin 500mg - 1/2 tablet QD  08/10/2018 LDL-62-at goal- currently on Rosuvastatin 20mg  QD  He reports ambulatory BP SBP 120-130s DBP 60-70s HR- upper 40s to low 60s- has been dipping into 40s the last 3 weeks He denies increase in fatigue He denies CP/chest tightness with exertion. He denies palpitations He is able to perform his house/yard work normally, without limitation. He has not contacted his cardiologist about his decreased HR- instructed to do so TODAY  He reports L posterior cervical neck pain >6 weeks. He denies acute injury/trauma prior to onset of sx's. Pain is intermittent,  described at "pulling", rated 7/10. He has been using Lidoderm patches, heating pad, OTC Aleve with sx relief. He denies numbness/tingling in LUE He has never been seen by PT or Ortho for this complaint, however has an established Orthopedic specialist.  Lab Results  Component Value Date   HGBA1C 5.8 (A) 02/22/2019   HGBA1C 5.2 12/01/2018   HGBA1C 5.8 (H) 08/10/2018    Patient Care Team    Relationship Specialty Notifications Start End  Esaw Grandchild, NP PCP - General Family Medicine  05/20/16   End, Harrell Gave, MD PCP - Cardiology Cardiology Admissions 10/28/17     Patient Active Problem List   Diagnosis Date Noted  . BPH associated with nocturia 11/29/2018  . 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 Encounter for Medicare annual wellness exam 08/17/2018  . Spinal stenosis of lumbar region 08/02/2018  . Flu-like symptoms 05/05/2018  . Bacterial conjunctivitis of both eyes 04/27/2018  . Atypical mole 07/30/2017  . Healthcare maintenance 07/30/2017  . Wheezing 07/21/2017  . Acute bronchitis 07/21/2017  . Neck pain 06/08/2017  . Sinus bradycardia 03/18/2017  . Screening for colon cancer 02/25/2017  . Ventral hernia without obstruction or gangrene 02/25/2017  . Chronic heart failure with preserved ejection fraction ([place]) 09/28/2016  . Ischemic cardiomyopathy 09/28/2016  . Cough in adult 07/22/2016  . Vitamin D deficiency 06/27/2016  . Vitamin B deficiency 06/27/2016  . Hyperlipidemia LDL goal <70 06/21/2016  . Weakness of both lower extremities 06/21/2016  . Diabetes mellitus without complication ([place]) Q000111Q  . Essential hypertension 05/20/2016  . Hyperlipidemia associated with type 2  diabetes mellitus ([place]) 05/20/2016  . 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 (HFpEF) heart failure with preserved ejection fraction ([place])    a.  10/2016 Echo: EF 65-70%, mild LVH, mildly dil LA. RV fxn nl.  . Arthritis   . BPH (benign prostatic hyperplasia)   . Coronary artery disease    a. 2001 CABG x 3 [facility]): LIMA->LAD, VG->LCX, VG->RPDA; b. 04/05/2010 Cath ([place]): LM 56m, LAD 162m, LCX 77m, RCA 100p, LIMA->LAD atretic, VG->LCX 100, VG->RCA 80ost, 70d; c. 04/2010 Redo CABG x 2 ([place]): VG->LAD, VG->RPDA; d. 10/2012 Cath [facility]): LM 20-30, LAD 100/95-59m, LCX 40-71m, RCA 100p, 80-100d, RPDA 99, RPL1 95, VG->LAD ok, VG->RPDA ok, EF 67%; e. 11/2016 MV: EF 48%, No ischemia.  . 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 Hyperlipidemia   . Hypertension   . Scoliosis   . Spinal stenosis      Past Surgical History:  Procedure Laterality Date  . CARDIAC CATHETERIZATION    . CORONARY ARTERY BYPASS GRAFT     x 2 (2001 and redo in late 2011 or early 2012)  . HERNIA REPAIR Left    inguinal  . LUMBAR LAMINECTOMY/DECOMPRESSION MICRODISCECTOMY N/A 10/20/2018   Procedure: L3-4, L4-5 LUMBAR DECOMPRESSION with dural repair.;  Surgeon: Marybelle Killings, MD;  Location: [place];  Service: Orthopedics;  Laterality: N/A;  . TONSILLECTOMY AND ADENOIDECTOMY    . TRANSURETHRAL RESECTION OF PROSTATE  1990     Family History  Problem Relation Age of Onset  . Diabetes Mother   . Hyperlipidemia Mother   . Cancer Father        colon  . Alcohol abuse Maternal Uncle   . Diabetes Maternal Uncle   . Hyperlipidemia Maternal Uncle   . Cancer Paternal Uncle        colon     Social History   Substance and Sexual Activity  Drug Use No     Social History   Substance and Sexual Activity  Alcohol Use Yes  . Alcohol/week: 11.0 standard drinks  . Types: 5 Glasses of wine, 6 Cans of beer per week     Social History   Tobacco Use  Smoking Status Never Smoker  Smokeless Tobacco Never Used     Outpatient Encounter Medications as of 02/22/2019  Medication Sig Note  . amLODipine (NORVASC) 2.5 MG tablet Take 1 tablet (2.5 mg  total) by mouth daily.   Marland Kitchen aspirin EC 81 MG tablet Take 1 tablet (81 mg total) by mouth daily.   . carvedilol (COREG) 6.25 MG tablet Take 1 tablet by mouth twice daily   . Cholecalciferol (VITAMIN D-3 PO) Take 800 Units by mouth daily.   . Coenzyme Q10 (CO Q-10) 200 MG CAPS Take 200 mg by mouth daily.    . cyanocobalamin 500 MCG tablet Take 500 mcg by mouth daily.   Marland Kitchen ezetimibe (ZETIA) 10 MG tablet Take 1 tablet by mouth once daily   . finasteride (PROSCAR) 5 MG tablet Take 1 tablet by mouth once daily   . Glucosamine-Chondroit-Vit C-Mn (GLUCOSAMINE 1500 COMPLEX) CAPS Take 1 capsule by mouth daily.   . Lancets (ONETOUCH ULTRASOFT) lancets Use to check blood sugars fasting daily   . metFORMIN (GLUCOPHAGE) 500 MG tablet 1/2 tablet by mouth daily with food   . Multiple Vitamins-Minerals (PRESERVISION AREDS 2 PO) Take 1 capsule by mouth 2 (two) times a  day.   . nitroGLYCERIN (NITROSTAT) 0.4 MG SL tablet Place 1 tablet (0.4 mg total) under the tongue as needed. 10/20/2018: Hasnt needed   . ranolazine (RANEXA) 1000 MG SR tablet TAKE 1 TABLET BY MOUTH TWICE DAILY *NEEDS OFFICE VISIT FOR FURTHER REFILLS*   . rosuvastatin (CRESTOR) 20 MG tablet Take 1 tablet (20 mg total) by mouth at bedtime.   . Diclofenac Sodium 1 % CREA Apply 1 application topically 2 (two) times daily as needed.    Facility-Administered Encounter Medications as of 02/22/2019  Medication  . ipratropium-albuterol (DUONEB) 0.5-2.5 (3) MG/3ML nebulizer solution 3 mL    Allergies: Isosorbide mononitrate [isosorbide dinitrate er] and Tetracyclines & related  Body mass index is 32.26 kg/m.  Blood pressure 136/62, pulse (!) 46, temperature 98.3 F (36.8 C), temperature source Oral, height 5\' 7"  (1.702 m), weight 206 lb (93.4 kg), SpO2 96 %.  Review of Systems  Constitutional: Positive for fatigue. Negative for activity change, appetite change, chills, diaphoresis, fever and unexpected weight change.  Eyes: Negative for visual  disturbance.  Respiratory: Negative for cough, chest tightness, shortness of breath, wheezing and stridor.   Cardiovascular: Negative for chest pain, palpitations and leg swelling.  Endocrine: Negative for polydipsia, polyphagia and polyuria.  Musculoskeletal: Positive for arthralgias, neck pain and neck stiffness.  Neurological: Negative for dizziness and headaches.  Hematological: Negative for adenopathy. Does not bruise/bleed easily.  Psychiatric/Behavioral: Negative for agitation, behavioral problems, confusion, decreased concentration, dysphoric mood, hallucinations, self-injury, sleep disturbance and suicidal ideas. The patient is not nervous/anxious and is not hyperactive.        Objective:   Physical Exam Vitals signs and nursing note reviewed.  Constitutional:      General: He is not in acute distress.    Appearance: Normal appearance. He is obese. He is not ill-appearing, toxic-appearing or diaphoretic.  HENT:     Head: Normocephalic and atraumatic.  Cardiovascular:     Rate and Rhythm: Normal rate and regular rhythm.     Pulses: Normal pulses.     Heart sounds: Murmur present.  Pulmonary:     Effort: Pulmonary effort is normal. No respiratory distress.     Breath sounds: Normal breath sounds. No stridor. No wheezing, rhonchi or rales.  Chest:     Chest wall: No tenderness.  Musculoskeletal:        General: Tenderness present.     Cervical back: He exhibits decreased range of motion, tenderness and spasm.  Skin:    Capillary Refill: Capillary refill takes less than 2 seconds.  Neurological:     Mental Status: He is alert and oriented to person, place, and time.  Psychiatric:        Mood and Affect: Mood normal.        Behavior: Behavior normal.        Thought Content: Thought content normal.        Judgment: Judgment normal.       Assessment & Plan:   1. Diabetes mellitus without complication ([place])   2. Healthcare maintenance   3. Essential hypertension   4.  Neck pain   5. Hyperlipidemia LDL goal <70     Healthcare maintenance Your A1c (average of your blood sugar over the last 3 months) is great-5.8. Microalbumin- normal. Your heart rate is very low- please call your Cardiologist today and let them know. Please use Diclofenac cream as needed and heating pad for neck pain. If neck pain does not improve- then recommend scheduling an appt with  your established Orthopedic specialist. Continue to social distance and wear a mask when in public.  Diabetes mellitus without complication ([place]) Lab Results  Component Value Date   HGBA1C 5.8 (A) 02/22/2019   HGBA1C 5.2 12/01/2018   HGBA1C 5.8 (H) 08/10/2018  AM BG 100-130s He denies episodes of hypoglycemia He is currently taking Metformin 500mg - 1/2 tablet QD Microalbumin normal today  Essential hypertension He reports ambulatory BP SBP 120-130s DBP 60-70s HR- upper 40s to low 60s- has been dipping into 40s the last 3 weeks  Neck pain Diclofenac cream Heating pad If sx's not improved- make an appt with your established Orthopedic specialist  Hyperlipidemia LDL goal <70 08/10/2018 LDL-62-at goal- currently on Rosuvastatin 20mg  QD    FOLLOW-UP:  Return in about 3 months (around 05/25/2019) for HTN, Diabetes, Obesity.

## 2019-02-22 ENCOUNTER — Encounter: Payer: Self-pay | Admitting: Adult Health

## 2019-02-22 ENCOUNTER — Telehealth: Payer: Self-pay | Admitting: Internal Medicine

## 2019-02-22 ENCOUNTER — Other Ambulatory Visit: Payer: Self-pay

## 2019-02-22 ENCOUNTER — Ambulatory Visit (INDEPENDENT_AMBULATORY_CARE_PROVIDER_SITE_OTHER): Payer: Medicare Other | Admitting: Adult Health

## 2019-02-22 VITALS — BP 136/62 | HR 46 | Temp 98.3°F | Ht 67.0 in | Wt 206.0 lb

## 2019-02-22 DIAGNOSIS — E785 Hyperlipidemia, unspecified: Secondary | ICD-10-CM

## 2019-02-22 DIAGNOSIS — I10 Essential (primary) hypertension: Secondary | ICD-10-CM | POA: Diagnosis not present

## 2019-02-22 DIAGNOSIS — M542 Cervicalgia: Secondary | ICD-10-CM

## 2019-02-22 DIAGNOSIS — E119 Type 2 diabetes mellitus without complications: Secondary | ICD-10-CM | POA: Diagnosis not present

## 2019-02-22 DIAGNOSIS — Z0000 Encounter for general adult medical examination without abnormal findings: Secondary | ICD-10-CM | POA: Diagnosis not present

## 2019-02-22 LAB — POCT GLYCOSYLATED HEMOGLOBIN (HGB A1C): Hemoglobin A1C: 5.8 % — AB (ref 4.0–5.6)

## 2019-02-22 LAB — POCT UA - MICROALBUMIN
Albumin/Creatinine Ratio, Urine, POC: <30
Creatinine, POC: 10 mg/dL
Microalbumin Ur, POC: 10 mg/L

## 2019-02-22 MED ORDER — DICLOFENAC SODIUM 1 % EX CREA: 1.0000 "application " | TOPICAL_CREAM | Freq: Two times a day (BID) | CUTANEOUS | 0 refills | Status: DC | PRN

## 2019-02-22 MED ORDER — CARVEDILOL 6.25 MG PO TABS: 3.1250 mg | ORAL_TABLET | Freq: Two times a day (BID) | ORAL | 0 refills | Status: DC

## 2019-02-22 MED ORDER — DICLOFENAC SODIUM 1 % EX GEL: 2.0000 g | Freq: Two times a day (BID) | CUTANEOUS | 0 refills | Status: DC | PRN

## 2019-02-22 NOTE — Telephone Encounter (Signed)
STAT if HR is under 50 or over 120 (normal HR is 60-100 beats per minute)  1) What is your heart rate? This morning at PCP office - HR 46  2) Do you have a log of your heart rate readings (document readings)? Last 2 to 3 weeks HR in low 50s  3) Do you have any other symptoms? BP is normal - no chest pain

## 2019-02-22 NOTE — Assessment & Plan Note (Signed)
Your A1c (average of your blood sugar over the last 3 months) is great-5.8. Microalbumin- normal. Your heart rate is very low- please call your Cardiologist today and let them know. Please use Diclofenac cream as needed and heating pad for neck pain. If neck pain does not improve- then recommend scheduling an appt with your established Orthopedic specialist. Continue to social distance and wear a mask when in public.

## 2019-02-22 NOTE — Telephone Encounter (Signed)
Spoke with patient. Saw his PCP today and they recommended he contact his cardiologist concerning low HR. At home reading of HR are low to mid 50's. BP is around 130's/60's on average. Patient denies chest pain, SOB, dizziness or swelling. He is scheduled for his 6 month follow up on 03/09/19 with Dr End. Verified his current med list and patient stated it is correct. Advised I will route to Dr End for further advice.

## 2019-02-22 NOTE — Telephone Encounter (Signed)
Patient verbalized understanding to cut carvedilol in half and take 1/2 tablet by mouth two times a day. He said the PCP did not get an EKG today. Dr End has opening this Friday at 0800 and patient is agreeable to that time and date. Appt scheduled.  Med list updated.

## 2019-02-22 NOTE — Patient Instructions (Addendum)
Diabetes Mellitus and Nutrition, Adult When you have diabetes (diabetes mellitus), it is very important to have healthy eating habits because your blood sugar (glucose) levels are greatly affected by what you eat and drink. Eating healthy foods in the appropriate amounts, at about the same times every day, can help you:  Control your blood glucose.  Lower your risk of heart disease.  Improve your blood pressure.  Reach or maintain a healthy weight. Every person with diabetes is different, and each person has different needs for a meal plan. Your health care provider may recommend that you work with a diet and nutrition specialist (dietitian) to make a meal plan that is best for you. Your meal plan may vary depending on factors such as:  The calories you need.  The medicines you take.  Your weight.  Your blood glucose, blood pressure, and cholesterol levels.  Your activity level.  Other health conditions you have, such as heart or kidney disease. How do carbohydrates affect me? Carbohydrates, also called carbs, affect your blood glucose level more than any other type of food. Eating carbs naturally raises the amount of glucose in your blood. Carb counting is a method for keeping track of how many carbs you eat. Counting carbs is important to keep your blood glucose at a healthy level, especially if you use insulin or take certain oral diabetes medicines. It is important to know how many carbs you can safely have in each meal. This is different for every person. Your dietitian can help you calculate how many carbs you should have at each meal and for each snack. Foods that contain carbs include:  Bread, cereal, rice, pasta, and crackers.  Potatoes and corn.  Peas, beans, and lentils.  Milk and yogurt.  Fruit and juice.  Desserts, such as cakes, cookies, ice cream, and candy. How does alcohol affect me? Alcohol can cause a sudden decrease in blood glucose (hypoglycemia),  especially if you use insulin or take certain oral diabetes medicines. Hypoglycemia can be a life-threatening condition. Symptoms of hypoglycemia (sleepiness, dizziness, and confusion) are similar to symptoms of having too much alcohol. If your health care provider says that alcohol is safe for you, follow these guidelines:  Limit alcohol intake to no more than 1 drink per day for nonpregnant women and 2 drinks per day for men. One drink equals 12 oz of beer, 5 oz of wine, or 1 oz of hard liquor.  Do not drink on an empty stomach.  Keep yourself hydrated with water, diet soda, or unsweetened iced tea.  Keep in mind that regular soda, juice, and other mixers may contain a lot of sugar and must be counted as carbs. What are tips for following this plan?  Reading food labels  Start by checking the serving size on the "Nutrition Facts" label of packaged foods and drinks. The amount of calories, carbs, fats, and other nutrients listed on the label is based on one serving of the item. Many items contain more than one serving per package.  Check the total grams (g) of carbs in one serving. You can calculate the number of servings of carbs in one serving by dividing the total carbs by 15. For example, if a food has 30 g of total carbs, it would be equal to 2 servings of carbs.  Check the number of grams (g) of saturated and trans fats in one serving. Choose foods that have low or no amount of these fats.  Check the number of   milligrams (mg) of salt (sodium) in one serving. Most people should limit total sodium intake to less than 2,300 mg per day.  Always check the nutrition information of foods labeled as "low-fat" or "nonfat". These foods may be higher in added sugar or refined carbs and should be avoided.  Talk to your dietitian to identify your daily goals for nutrients listed on the label. Shopping  Avoid buying canned, premade, or processed foods. These foods tend to be high in fat, sodium,  and added sugar.  Shop around the outside edge of the grocery store. This includes fresh fruits and vegetables, bulk grains, fresh meats, and fresh dairy. Cooking  Use low-heat cooking methods, such as baking, instead of high-heat cooking methods like deep frying.  Cook using healthy oils, such as olive, canola, or sunflower oil.  Avoid cooking with butter, cream, or high-fat meats. Meal planning  Eat meals and snacks regularly, preferably at the same times every day. Avoid going long periods of time without eating.  Eat foods high in fiber, such as fresh fruits, vegetables, beans, and whole grains. Talk to your dietitian about how many servings of carbs you can eat at each meal.  Eat 4-6 ounces (oz) of lean protein each day, such as lean meat, chicken, fish, eggs, or tofu. One oz of lean protein is equal to: ? 1 oz of meat, chicken, or fish. ? 1 egg. ?  cup of tofu.  Eat some foods each day that contain healthy fats, such as avocado, nuts, seeds, and fish. Lifestyle  Check your blood glucose regularly.  Exercise regularly as told by your health care provider. This may include: ? 150 minutes of moderate-intensity or vigorous-intensity exercise each week. This could be brisk walking, biking, or water aerobics. ? Stretching and doing strength exercises, such as yoga or weightlifting, at least 2 times a week.  Take medicines as told by your health care provider.  Do not use any products that contain nicotine or tobacco, such as cigarettes and e-cigarettes. If you need help quitting, ask your health care provider.  Work with a Social worker or diabetes educator to identify strategies to manage stress and any emotional and social challenges. Questions to ask a health care provider  Do I need to meet with a diabetes educator?  Do I need to meet with a dietitian?  What number can I call if I have questions?  When are the best times to check my blood glucose? Where to find more  information:  American Diabetes Association: diabetes.org  Academy of Nutrition and Dietetics: www.eatright.[employer] of Diabetes and Digestive and Kidney Diseases (NIH): [employer] Summary  A healthy meal plan will help you control your blood glucose and maintain a healthy lifestyle.  Working with a diet and nutrition specialist (dietitian) can help you make a meal plan that is best for you.  Keep in mind that carbohydrates (carbs) and alcohol have immediate effects on your blood glucose levels. It is important to count carbs and to use alcohol carefully. This information is not intended to replace advice given to you by your health care provider. Make sure you discuss any questions you have with your health care provider. Document Released: 12/19/2004 Document Revised: 03/06/2017 Document Reviewed: 04/28/2016 Elsevier Patient Education  2020 [place].   Type 2 Diabetes Mellitus, Diagnosis, Adult Type 2 diabetes (type 2 diabetes mellitus) is a long-term (chronic) disease. It may be caused by one or both of these problems:  Your pancreas does not  make enough of a hormone called insulin.  Your body does not react in a normal way to insulin that it makes. Insulin lets sugars (glucose) go into cells in your body. This gives you energy. If you have type 2 diabetes, sugars cannot get into cells. This causes high blood sugar (hyperglycemia). Your doctor will set treatment goals for you. Generally, you should have these blood sugar levels:  Before meals (preprandial): 80-130 mg/dL (4.4-7.2 mmol/L).  After meals (postprandial): below 180 mg/dL (10 mmol/L).  A1c (hemoglobin A1c) level: less than 7%. Follow these instructions at home: Questions to ask your doctor  You may want to ask these questions: ? Do I need to meet with a diabetes educator? ? Where can I find a support group for people with diabetes? ? What equipment will I need to care for myself at home?  ? What diabetes medicines do I need? When should I take them? ? How often do I need to check my blood sugar? ? What number can I call if I have questions? ? When is my next doctor's visit? General instructions  Take over-the-counter and prescription medicines only as told by your doctor.  Keep all follow-up visits as told by your doctor. This is important. Contact a doctor if:  Your blood sugar is at or above 240 mg/dL (13.3 mmol/L) for 2 days in a row.  You have been sick for 2 days or more, and you are not getting better.  You have had a fever for 2 days or more, and you are not getting better.  You have any of these problems for more than 6 hours: ? You cannot eat or drink. ? You feel sick to your stomach (nauseous). ? You throw up (vomit). ? You have watery poop (diarrhea). Get help right away if:  Your blood sugar is lower than 54 mg/dL (3 mmol/L).  You get confused.  You have trouble: ? Thinking clearly. ? Breathing.  You have moderate or large ketone levels in your pee (urine). Summary  Type 2 diabetes is a long-term (chronic) disease. Your pancreas may not make enough of a hormone called insulin, or your body may not react normally to insulin that it makes.  Take over-the-counter and prescription medicines only as told by your doctor.  Keep all follow-up visits as told by your doctor. This is important. This information is not intended to replace advice given to you by your health care provider. Make sure you discuss any questions you have with your health care provider. Document Released: 01/01/2008 Document Revised: 05/22/2017 Document Reviewed: 04/27/2015 Elsevier Patient Education  [place].  Your A1c (average of your blood sugar over the last 3 months) is great-5.8. Microalbumin- normal. Your heart rate is very low- please call your Cardiologist today and let them know. Please use Diclofenac cream as needed and heating pad for neck pain. If neck  pain does not improve- then recommend scheduling an appt with your established Orthopedic specialist. Continue to social distance and wear a mask when in public. Follow-up here in 3 months.

## 2019-02-22 NOTE — Assessment & Plan Note (Signed)
He reports ambulatory BP SBP 120-130s DBP 60-70s HR- upper 40s to low 60s- has been dipping into 40s the last 3 weeks

## 2019-02-22 NOTE — Assessment & Plan Note (Signed)
Diclofenac cream Heating pad If sx's not improved- make an appt with your established Orthopedic specialist

## 2019-02-22 NOTE — Progress Notes (Signed)
Received fax from [facility] that RX for diclofenac does not come in a cream and requests gel formulation.  New RX for gel form sent to pharmacy.  Charyl Bigger, CMA

## 2019-02-22 NOTE — Assessment & Plan Note (Signed)
>>  ASSESSMENT AND PLAN FOR MIXED HYPERLIPIDEMIA WRITTEN ON 02/22/2019 12:30 PM BY DANFORD, KATY D, NP  08/10/2018 LDL-62-at goal- currently on Rosuvastatin 20mg  QD

## 2019-02-22 NOTE — Telephone Encounter (Signed)
It would have been helpful if his PCP had been able to get an EKG.  Can we have him come in for an EKG only?  If not, let's set him up to see an APP this week.  In the meantime, he should cut his carvedilol in half to 3.125 mg BID.

## 2019-02-22 NOTE — Assessment & Plan Note (Signed)
08/10/2018 LDL-62-at goal- currently on Rosuvastatin 20mg  QD

## 2019-02-22 NOTE — Assessment & Plan Note (Signed)
Lab Results  Component Value Date   HGBA1C 5.8 (A) 02/22/2019   HGBA1C 5.2 12/01/2018   HGBA1C 5.8 (H) 08/10/2018  AM BG 100-130s He denies episodes of hypoglycemia He is currently taking Metformin 500mg - 1/2 tablet QD Microalbumin normal today

## 2019-02-23 ENCOUNTER — Ambulatory Visit: Payer: Medicare Other | Admitting: Adult Health

## 2019-02-25 ENCOUNTER — Ambulatory Visit (INDEPENDENT_AMBULATORY_CARE_PROVIDER_SITE_OTHER): Payer: Medicare Other | Admitting: Internal Medicine

## 2019-02-25 ENCOUNTER — Other Ambulatory Visit: Payer: Self-pay

## 2019-02-25 ENCOUNTER — Encounter: Payer: Self-pay | Admitting: Internal Medicine

## 2019-02-25 VITALS — BP 140/68 | HR 70 | Ht 67.0 in | Wt 200.2 lb

## 2019-02-25 DIAGNOSIS — I5032 Chronic diastolic (congestive) heart failure: Secondary | ICD-10-CM

## 2019-02-25 DIAGNOSIS — I25118 Atherosclerotic heart disease of native coronary artery with other forms of angina pectoris: Secondary | ICD-10-CM

## 2019-02-25 DIAGNOSIS — R001 Bradycardia, unspecified: Secondary | ICD-10-CM

## 2019-02-25 DIAGNOSIS — E785 Hyperlipidemia, unspecified: Secondary | ICD-10-CM

## 2019-02-25 DIAGNOSIS — I10 Essential (primary) hypertension: Secondary | ICD-10-CM

## 2019-02-25 MED ORDER — AMLODIPINE BESYLATE 5 MG PO TABS: 5.0000 mg | ORAL_TABLET | Freq: Every day | ORAL | 3 refills | Status: DC

## 2019-02-25 NOTE — Progress Notes (Signed)
Follow-up Outpatient Visit Date: 02/25/2019  Primary Care Provider: Esaw Grandchild, NP [place] [address]  Chief Complaint: Neck pain  HPI:  [name] is a 78 y.o. 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  He contacted our office earlier this week regarding low heart rates.  He reported home readings in the low to mid 50s.  His pulse rate was noted to be 46 bpm at his PCPs visit 3 days ago.  No EKG was performed at that time to determine underlying rhythm.  I recommended cutting carvedilol in half to 3.125 mg twice daily.  Jacob Harrison was asymptomatic at the time.  I last spoke with him via virtual visit in late April.  He was struggling with back pain at the time.  He also noted a few episodes of chest pain at rest that resolved promptly with sublingual nitroglycerin.  Subsequent pharmacologic myocardial perfusion stress test was low risk with basal inferior fixed defect most likely representing attenuation artifact.  No significant ischemia was noted.  We agreed to start amlodipine 2.5 mg daily with subsequent resolution in his chest pain.  Today, Jacob Harrison reports that he has been feeling relatively well other than left posterior neck pain that began after doing quite a bit of yard work in mid September.  It continues to bother him, especially when he turns his head to the left.  A few times, he try taking sublingual nitroglycerin to see if this would help but did not have any relief.  His exertional dyspnea is stable.  He notes that his activity has increased following back surgery this summer.  He is planning to reach out to his spine surgeon for further evaluation of the aforementioned neck pain.  Jacob Harrison denies chest pain, palpitations, and lightheadedness.  He is tolerating amlodipine 2.5 mg daily well with only  minimal chronic ankle edema.  His blood pressure has increased a bit with de-escalation of carvedilol due to bradycardia.  His resting heart rate remains in the low to mid 50s at times, albeit without symptoms.  --------------------------------------------------------------------------------------------------  Cardiovascular History & Procedures: Cardiovascular Problems:  Coronary artery disease  Ischemic cardiomyopathy  Heart failure with preserved ejection fraction (HFpEF)  Risk Factors:  Known coronary artery disease, hypertension, hyperlipidemia, diabetes mellitus, male gender, obesity, and age > 65  Cath/PCI:  LHC (10/18/12, [employer], [place]): LMCA 20-30% ostial/proximal stenosis. LAD with sequential 100% and 95-99% mid stenoses. LCx with 40-50% mid stenosis. RCA with 100% proximal and 80-100% sequential distal lesions. RPDA and rPL1 have 99% and 95% stenoses, respectively. SVG->LAD and SVG->rPDA are patent. LVEF 67% with normal wall motion.  LHC (04/05/10, [place], [place]): LMCA with 30% mid stenosis. LAD with 100% midvessel occlution. LCx with 40% mid stenosis. RCA with 100% proximal stenosis. LIMA->LAD atretic, SVG->LCx occluded, and SVG-> RCA with 80% ostial and 70% distal stenoses.  CV Surgery:  Redo CABG (04/2010, [place], [place]): SVG->LAD and SVG->rPDA.  CABG (2001, [place]): LIMA->LAD, SVG->LCx, and SVG->rPDA  EP Procedures and Devices:  None  Non-Invasive Evaluation(s):  Pharmacologic MPI (11/06/16): Low risk study without ischemia or scar. LVEF 48% (calculated) but visually appears normal.  TTE (10/21/16): Normal LV size with mild LVH. LVEF 65-70% with normal diastolic function. Mild LA enlargement. Normal RV size and function.  ABIs (03/08/15): Right 1.25, left 1.26;  no significant change with exercise.  Exercise myocardial perfusion stress test (10/07/12): Small to moderate in size, mild to moderate in severity, partially reversible  defect involving the mid inferior and inferolateral segments. LVEF 65%.  Recent CV Pertinent Labs: Lab Results  Component Value Date   CHOL 140 08/10/2018   HDL 49 08/10/2018   LDLCALC 62 08/10/2018   TRIG 145 08/10/2018   CHOLHDL 2.9 08/10/2018   CHOLHDL 2.7 11/14/2016   K 4.5 10/21/2018   MG 2.2 07/30/2017   BUN 8 10/21/2018   BUN 9 08/10/2018   CREATININE 0.81 10/21/2018    Past medical and surgical history were reviewed and updated in EPIC.  Current Meds  Medication Sig  . amLODipine (NORVASC) 2.5 MG tablet Take 1 tablet (2.5 mg total) by mouth daily.  Marland Kitchen aspirin EC 81 MG tablet Take 1 tablet (81 mg total) by mouth daily.  . carvedilol (COREG) 6.25 MG tablet Take 0.5 tablets (3.125 mg total) by mouth 2 (two) times daily.  . Cholecalciferol (VITAMIN D-3 PO) Take 800 Units by mouth daily.  . Coenzyme Q10 (CO Q-10) 200 MG CAPS Take 200 mg by mouth daily.   . cyanocobalamin 500 MCG tablet Take 500 mcg by mouth daily.  . diclofenac Sodium (VOLTAREN) 1 % GEL Apply 2 g topically 2 (two) times daily as needed.  . ezetimibe (ZETIA) 10 MG tablet Take 1 tablet by mouth once daily  . finasteride (PROSCAR) 5 MG tablet Take 1 tablet by mouth once daily  . Glucosamine-Chondroit-Vit C-Mn (GLUCOSAMINE 1500 COMPLEX) CAPS Take 1 capsule by mouth daily.  . Lancets (ONETOUCH ULTRASOFT) lancets Use to check blood sugars fasting daily  . metFORMIN (GLUCOPHAGE) 500 MG tablet 1/2 tablet by mouth daily with food  . Multiple Vitamins-Minerals (PRESERVISION AREDS 2 PO) Take 1 capsule by mouth 2 (two) times a day.  . nitroGLYCERIN (NITROSTAT) 0.4 MG SL tablet Place 1 tablet (0.4 mg total) under the tongue as needed.  . ranolazine (RANEXA) 1000 MG SR tablet TAKE 1 TABLET BY MOUTH TWICE DAILY *NEEDS OFFICE VISIT FOR FURTHER REFILLS*  . rosuvastatin (CRESTOR) 20 MG tablet Take 1 tablet (20 mg total) by mouth at bedtime.   Current Facility-Administered Medications for the 02/25/19 encounter (Office Visit)  with Jacob Harrison, Harrell Gave, MD  Medication  . ipratropium-albuterol (DUONEB) 0.5-2.5 (3) MG/3ML nebulizer solution 3 mL    Allergies: Isosorbide mononitrate [isosorbide dinitrate er] and Tetracyclines & related  Social History   Tobacco Use  . Smoking status: Never Smoker  . Smokeless tobacco: Never Used  Substance Use Topics  . Alcohol use: Yes    Alcohol/week: 11.0 standard drinks    Types: 5 Glasses of wine, 6 Cans of beer per week  . Drug use: No    Family History  Problem Relation Age of Onset  . Diabetes Mother   . Hyperlipidemia Mother   . Cancer Father        colon  . Alcohol abuse Maternal Uncle   . Diabetes Maternal Uncle   . Hyperlipidemia Maternal Uncle   . Cancer Paternal Uncle        colon    Review of Systems: A 12-system review of systems was performed and was negative except as noted in the HPI.  --------------------------------------------------------------------------------------------------  Physical Exam: BP 140/68 (BP Location: Left Arm, Patient Position: Sitting, Cuff Size: Normal)   Pulse 70   Ht 5\' 7"  (1.702 m)   Wt 200 lb 4 oz (90.8 kg)   SpO2 98%  BMI 31.36 kg/m   General: NAD. HEENT: No conjunctival pallor or scleral icterus. Facemask in place. Neck: Supple without lymphadenopathy, thyromegaly, JVD, or HJR. Lungs: Normal work of breathing. Clear to auscultation bilaterally without wheezes or crackles. Heart: Regular rate and rhythm with 2/6 systolic murmur.  No rubs or gallops. Non-displaced PMI. Abd: Bowel sounds present. Soft, NT/ND without hepatosplenomegaly Ext: Trace ankle edema bilaterally.. Skin: Warm and dry without rash.  EKG: Normal sinus rhythm without abnormality.  Lab Results  Component Value Date   WBC 19.1 (H) 10/21/2018   HGB 11.8 (L) 10/21/2018   HCT 34.6 (L) 10/21/2018   MCV 91.5 10/21/2018   PLT 202 10/21/2018    Lab Results  Component Value Date   NA 138 10/21/2018   K 4.5 10/21/2018   CL 101 10/21/2018    CO2 25 10/21/2018   BUN 8 10/21/2018   CREATININE 0.81 10/21/2018   GLUCOSE 173 (H) 10/21/2018   ALT 26 10/13/2018    Lab Results  Component Value Date   CHOL 140 08/10/2018   HDL 49 08/10/2018   LDLCALC 62 08/10/2018   TRIG 145 08/10/2018   CHOLHDL 2.9 08/10/2018    --------------------------------------------------------------------------------------------------  ASSESSMENT AND PLAN: Coronary artery disease with stable angina: Jacob Harrison has done well without further chest pain since addition of amlodipine earlier this year.  I do not believe that his neck discomfort is cardiac in etiology; most likely this is musculoskeletal or neuropathic.  I think it is reasonable for him to follow-up with his orthopedist for further evaluation.  In regard to his CAD, we will continue current medications for secondary prevention and antianginal therapy (including carvedilol 3.125 mg twice daily, ranolazine 1000 mg twice daily, and amlodipine-increase to 5 mg daily as below).  Sinus bradycardia: Heart rate today is 70 bpm, though Jacob Harrison notes his heart rate is still often in the low to mid 50s at home when at rest.  Given lack of symptoms, I think it is best to continue current dose of carvedilol.  We will check a TSH as well as BMP and magnesium level to exclude reversible causes of bradycardia.  Chronic HFpEF: Jacob Harrison reports stable NYHA class II heart failure symptoms and mild chronic leg edema.  We will work on blood pressure control with escalation of amlodipine.  No other medication changes at this time.  Hyperlipidemia: LDL at goal.  Continue ezetimibe and rosuvastatin 20 mg nightly.  Hypertension: Blood pressure suboptimally controlled today.  We will increase amlodipine to 5 mg daily.  Carvedilol dose will be continued.  I asked Jacob Harrison to contact us if his home blood pressures are consistently above 130/80.  I would favor addition of an ACE inhibitor or ARB if additional  agents are needed, given history of diabetes.  Follow-up: Return to clinic in 6 months.  Nelva Bush, MD 02/25/2019 8:19 AM

## 2019-02-25 NOTE — Patient Instructions (Addendum)
Medication Instructions:  Your physician has recommended you make the following change in your medication:  1. INCREASE Amlodipine to 5 mg once daily  *If you need a refill on your cardiac medications before your next appointment, please call your pharmacy*  Lab Work: TSH, BMP, Magnesium done today.    If you have labs (blood work) drawn today and your tests are completely normal, you will receive your results only by: Marland Kitchen MyChart Message (if you have MyChart) OR . A paper copy in the mail If you have any lab test that is abnormal or we need to change your treatment, we will call you to review the results.  Testing/Procedures: None  Follow-Up: At [facility], you and your health needs are our priority.  As part of our continuing mission to provide you with exceptional heart care, we have created designated Provider Care Teams.  These Care Teams include your primary Cardiologist (physician) and Advanced Practice Providers (APPs -  Physician Assistants and Nurse Practitioners) who all work together to provide you with the care you need, when you need it.  Your next appointment:   6 month(s)  The format for your next appointment:   In Person  Provider:   Nelva Bush, MD

## 2019-02-26 LAB — BASIC METABOLIC PANEL
BUN/Creatinine Ratio: 12 (ref 10–24)
BUN: 11 mg/dL (ref 8–27)
CO2: 24 mmol/L (ref 20–29)
Calcium: 10 mg/dL (ref 8.6–10.2)
Chloride: 99 mmol/L (ref 96–106)
Creatinine, Ser: 0.94 mg/dL (ref 0.76–1.27)
GFR calc Af Amer: 91 mL/min/{1.73_m2} (ref >59)
GFR calc non Af Amer: 78 mL/min/{1.73_m2} (ref >59)
Glucose: 117 mg/dL — ABNORMAL HIGH (ref 65–99)
Potassium: 4.5 mmol/L (ref 3.5–5.2)
Sodium: 139 mmol/L (ref 134–144)

## 2019-02-26 LAB — TSH: TSH: 1.83 u[IU]/mL (ref 0.450–4.500)

## 2019-02-26 LAB — MAGNESIUM: Magnesium: 1.9 mg/dL (ref 1.6–2.3)

## 2019-03-09 ENCOUNTER — Ambulatory Visit: Payer: Medicare Other | Admitting: Internal Medicine

## 2019-03-14 ENCOUNTER — Other Ambulatory Visit: Payer: Self-pay | Admitting: Adult Health

## 2019-04-19 ENCOUNTER — Telehealth: Payer: Self-pay | Admitting: Internal Medicine

## 2019-04-19 NOTE — Telephone Encounter (Signed)
Patient calling in regarding med change from November. States HR and BP have been elevated for 7-10 days. Other than that patient has been feeling well. HR has increased to 70-75 bpm and BP has been around 140/70. No other symptoms or problems.   Please advise when able

## 2019-04-19 NOTE — Telephone Encounter (Signed)
I spoke with the patient. He states he was calling today as he has noticed that his BP/ HR readings have been going up a little bit. He was seen in the office on 02/25/19 by Dr. Saunders Revel.  The patient had been having some bradycardia with HR's in the low to mid 50's, though he was 70 bpm at his office visit.  BP was 140/68 at that time.  Coreg had been decreased to 3.125 mg BID prior to that visit. Amlodipine was increased to 5 mg QD at that time.  Per the patient, his BP was better at 130-135/60 for about a week after his office visit. This has been trending up to now ~ 140/70. HR's were running 60-65 bpm until the last 7-10 days and now they are running 70-75 bpm.  The patient is not having any symptoms aside from some neck pain for which he has been using a topical nonsteroidal cream for ~ 3 weeks.   I have advised the patient that his HR's are acceptable in the 70-75 bpm range, but his BP is slightly elevated. He is aware this could possibly be due to some of the pain he is having, I will forward to Dr. Saunders Revel for further review/ recommendations regarding his BP/ HR readings.  He is aware we will call with MD recommendations.  The patient is agreeable.

## 2019-04-20 NOTE — Telephone Encounter (Signed)
Patient verbalized understanding of Dr Darnelle Bos recommendations and will continue to monitor and let us know if remains elevated.

## 2019-04-20 NOTE — Telephone Encounter (Signed)
BP noted to be mildly elevated recently.  As long as he is asymptomatic, I recommend that Jacob Harrison continue his current medications and work on lifestyle modifications (limiting sodium intake and exercise).  If BP continues to remain elevated over the next few weeks, he should let us know.  Nelva Bush, MD [facility]

## 2019-04-25 ENCOUNTER — Other Ambulatory Visit: Payer: Self-pay | Admitting: Internal Medicine

## 2019-04-25 DIAGNOSIS — I25118 Atherosclerotic heart disease of native coronary artery with other forms of angina pectoris: Secondary | ICD-10-CM

## 2019-04-26 NOTE — Progress Notes (Signed)
Virtual Visit via Telephone Note  I connected with Jacob Harrison on 04/27/2019 at 10:00 AM EST by telephone and verified that I am speaking with the correct person using two identifiers.  Location: Patient: Home Provider: In Clinic   I discussed the limitations, risks, security and privacy concerns of performing an evaluation and management service by telephone and the availability of in person appointments. I also discussed with the patient that there may be a patient responsible charge related to this service. The patient expressed understanding and agreed to proceed.   History of Present Illness: Jacob Harrison calls in non-productive cough, copious clear nasal drainage, and watery eyes- sx's began >8 days. Sx's improved at day 5, then sig worsened the last 2 days. He denies fever/body aches/N/V/D. He denies loss of sense/taste. He denies HA/shortness of breath. He denies known exposure to [facility] Virus. He has not left his home in >10 days . Only visitors - his daughter and her boyfriend 04/16/2019-both recently tested NEG for SARS-CoV-2 Current temp 98.34f oral. He reports good appetite. He will often have a sinus inf during winter time  Patient Care Team    Relationship Specialty Notifications Start End  Esaw Grandchild, NP PCP - General Family Medicine  05/20/16   End, Harrell Gave, MD PCP - Cardiology Cardiology Admissions 10/28/17     Patient Active Problem List   Diagnosis Date Noted  . Sinusitis 04/27/2019  . BPH associated with nocturia 11/29/2018  . 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 Encounter for Medicare annual wellness exam 08/17/2018  . Spinal stenosis of lumbar region 08/02/2018  . Flu-like symptoms 05/05/2018  . Bacterial conjunctivitis of both eyes 04/27/2018  . Atypical mole 07/30/2017  . Healthcare maintenance 07/30/2017  . Wheezing 07/21/2017  . Acute bronchitis 07/21/2017  . Neck pain 06/08/2017  . Sinus  bradycardia 03/18/2017  . Screening for colon cancer 02/25/2017  . Ventral hernia without obstruction or gangrene 02/25/2017  . Chronic heart failure with preserved ejection fraction ([place]) 09/28/2016  . Ischemic cardiomyopathy 09/28/2016  . Cough in adult 07/22/2016  . Vitamin D deficiency 06/27/2016  . Vitamin B deficiency 06/27/2016  . Hyperlipidemia LDL goal <70 06/21/2016  . Weakness of both lower extremities 06/21/2016  . Diabetes mellitus without complication ([place]) [id number]  . Essential hypertension 05/20/2016  . Hyperlipidemia associated with type 2 diabetes mellitus ([place]) 05/20/2016  . Coronary artery disease of native artery of native heart with stable angina pectoris ([place]) 05/20/2016     Past Medical History:  Diagnosis Date  . (HFpEF) heart failure with preserved ejection fraction ([place])    a. 10/2016 Echo: EF 65-70%, mild LVH, mildly dil LA. RV fxn nl.  . Arthritis   . BPH (benign prostatic hyperplasia)   . Coronary artery disease    a. 2001 CABG x 3 [facility]): LIMA->LAD, VG->LCX, VG->RPDA; b. 04/05/2010 Cath ([place]): LM 81m, LAD 134m, LCX 53m, RCA 100p, LIMA->LAD atretic, VG->LCX 100, VG->RCA 80ost, 70d; c. 04/2010 Redo CABG x 2 ([place]): VG->LAD, VG->RPDA; d. 10/2012 Cath [facility]): LM 20-30, LAD 100/95-28m, LCX 40-20m, RCA 100p, 80-100d, RPDA 99, RPL1 95, VG->LAD ok, VG->RPDA ok, EF 67%; e. 11/2016 MV: EF 48%, No ischemia.  . 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 Hyperlipidemia   . Hypertension   . Scoliosis   . Spinal stenosis      Past Surgical History:  Procedure Laterality Date  . CARDIAC CATHETERIZATION    . CORONARY  ARTERY BYPASS GRAFT     x 2 (2001 and redo in late 2011 or early 2012)  . HERNIA REPAIR Left    inguinal  . LUMBAR LAMINECTOMY/DECOMPRESSION MICRODISCECTOMY N/A 10/20/2018   Procedure: L3-4, L4-5 LUMBAR DECOMPRESSION with dural repair.;  Surgeon: Marybelle Killings, MD;  Location: [place];  Service: Orthopedics;  Laterality: N/A;   . TONSILLECTOMY AND ADENOIDECTOMY    . TRANSURETHRAL RESECTION OF PROSTATE  1990     Family History  Problem Relation Age of Onset  . Diabetes Mother   . Hyperlipidemia Mother   . Cancer Father        colon  . Alcohol abuse Maternal Uncle   . Diabetes Maternal Uncle   . Hyperlipidemia Maternal Uncle   . Cancer Paternal Uncle        colon     Social History   Substance and Sexual Activity  Drug Use No     Social History   Substance and Sexual Activity  Alcohol Use Yes  . Alcohol/week: 11.0 standard drinks  . Types: 5 Glasses of wine, 6 Cans of beer per week     Social History   Tobacco Use  Smoking Status Never Smoker  Smokeless Tobacco Never Used     Outpatient Encounter Medications as of 04/27/2019  Medication Sig Note  . amLODipine (NORVASC) 5 MG tablet Take 1 tablet (5 mg total) by mouth daily.   Marland Kitchen aspirin EC 81 MG tablet Take 1 tablet (81 mg total) by mouth daily.   . carvedilol (COREG) 6.25 MG tablet Take 0.5 tablets (3.125 mg total) by mouth 2 (two) times daily.   . Cholecalciferol (VITAMIN D-3 PO) Take 800 Units by mouth daily.   . Coenzyme Q10 (CO Q-10) 200 MG CAPS Take 200 mg by mouth daily.    . cyanocobalamin 500 MCG tablet Take 500 mcg by mouth daily.   . diclofenac Sodium (VOLTAREN) 1 % GEL Apply 2 g topically 2 (two) times daily as needed.   . ezetimibe (ZETIA) 10 MG tablet Take 1 tablet by mouth once daily   . finasteride (PROSCAR) 5 MG tablet Take 1 tablet by mouth once daily   . Glucosamine-Chondroit-Vit C-Mn (GLUCOSAMINE 1500 COMPLEX) CAPS Take 1 capsule by mouth daily.   . Lancets (ONETOUCH ULTRASOFT) lancets Use to check blood sugars fasting daily   . metFORMIN (GLUCOPHAGE) 500 MG tablet 1/2 tablet by mouth daily with food   . Multiple Vitamins-Minerals (PRESERVISION AREDS 2 PO) Take 1 capsule by mouth 2 (two) times a day.   . nitroGLYCERIN (NITROSTAT) 0.4 MG SL tablet Place 1 tablet (0.4 mg total) under the tongue as needed. 10/20/2018:  Hasnt needed   . ranolazine (RANEXA) 1000 MG SR tablet TAKE 1  BY MOUTH TWICE DAILY   . rosuvastatin (CRESTOR) 20 MG tablet TAKE 1 TABLET BY MOUTH AT BEDTIME   . amoxicillin-clavulanate (AUGMENTIN) 875-125 MG tablet Take 1 tablet by mouth 2 (two) times daily.   . fluticasone (FLONASE) 50 MCG/ACT nasal spray Place 2 sprays into both nostrils daily.    Facility-Administered Encounter Medications as of 04/27/2019  Medication  . ipratropium-albuterol (DUONEB) 0.5-2.5 (3) MG/3ML nebulizer solution 3 mL    Allergies: Isosorbide mononitrate [isosorbide dinitrate er] and Tetracyclines & related  Body mass index is 32.11 kg/m.  Blood pressure 140/68, pulse 70, temperature 98.1 F (36.7 C), temperature source Oral, height 5\' 7"  (1.702 m), weight 205 lb (93 kg). Review of Systems: General:   Denies fever, chills, unexplained weight  loss.  Optho/Auditory:   Denies visual changes, blurred vision/LOV ENT: Nasal Drainage + Respiratory:  Non-productive cough + Denies SOB, DOE more than baseline levels.  Cardiovascular:   Denies chest pain, palpitations, new onset peripheral edema  Gastrointestinal:   Denies nausea, vomiting, diarrhea.  Genitourinary: Denies dysuria, freq/ urgency, flank pain or discharge from genitals.  Endocrine:     Denies hot or cold intolerance, polyuria, polydipsia. Musculoskeletal:   Denies unexplained myalgias, joint swelling, unexplained arthralgias, gait problems.  Skin:  Denies rash, suspicious lesions Neurological:     Denies dizziness, unexplained weakness, numbness  Psychiatric/Behavioral:   Denies mood changes, suicidal or homicidal ideations, hallucinations  Observations/Objective: No acute distress noted during telephone conversation.  Assessment and Plan: Increase fluids, rest. Flonase, Augmentin for sinus infection. Recommend SARS-CoV-2 testing- follow quarantine guidelines.  Follow Up Instructions: PRN- if sx's continues after ABX completed.   I  discussed the assessment and treatment plan with the patient. The patient was provided an opportunity to ask questions and all were answered. The patient agreed with the plan and demonstrated an understanding of the instructions.   The patient was advised to call back or seek an in-person evaluation if the symptoms worsen or if the condition fails to improve as anticipated.  I provided 15 minutes of non-face-to-face time during this encounter.   Esaw Grandchild, NP

## 2019-04-27 ENCOUNTER — Encounter: Payer: Self-pay | Admitting: Adult Health

## 2019-04-27 ENCOUNTER — Ambulatory Visit (INDEPENDENT_AMBULATORY_CARE_PROVIDER_SITE_OTHER): Payer: Medicare Other | Admitting: Adult Health

## 2019-04-27 ENCOUNTER — Other Ambulatory Visit: Payer: Self-pay

## 2019-04-27 DIAGNOSIS — J019 Acute sinusitis, unspecified: Secondary | ICD-10-CM | POA: Diagnosis not present

## 2019-04-27 DIAGNOSIS — R05 Cough: Secondary | ICD-10-CM | POA: Diagnosis not present

## 2019-04-27 DIAGNOSIS — J329 Chronic sinusitis, unspecified: Secondary | ICD-10-CM | POA: Insufficient documentation

## 2019-04-27 DIAGNOSIS — R059 Cough, unspecified: Secondary | ICD-10-CM

## 2019-04-27 LAB — NOVEL CORONAVIRUS, NAA: Coronavirus Source: NEGATIVE

## 2019-04-27 MED ORDER — FLUTICASONE PROPIONATE 50 MCG/ACT NA SUSP: 2.0000 | Freq: Every day | NASAL | 0 refills | Status: DC

## 2019-04-27 MED ORDER — AMOXICILLIN-POT CLAVULANATE 875-125 MG PO TABS: 1.0000 | ORAL_TABLET | Freq: Two times a day (BID) | ORAL | 0 refills | Status: DC

## 2019-04-27 NOTE — Assessment & Plan Note (Signed)
Recommended SARS-CoV- 2 testing Follow quarantine guidelines

## 2019-04-27 NOTE — Assessment & Plan Note (Signed)
Assessment and Plan: Increase fluids, rest. Flonase, Augmentin for sinus infection. Recommend SARS-CoV-2 testing- follow quarantine guidelines.  Follow Up Instructions: PRN- if sx's continues after ABX completed.   I discussed the assessment and treatment plan with the patient. The patient was provided an opportunity to ask questions and all were answered. The patient agreed with the plan and demonstrated an understanding of the instructions.   The patient was advised to call back or seek an in-person evaluation if the symptoms worsen or if the condition fails to improve as anticipated.

## 2019-04-30 ENCOUNTER — Encounter: Payer: Self-pay | Admitting: Adult Health

## 2019-05-10 LAB — HM DIABETES EYE EXAM

## 2019-05-16 ENCOUNTER — Other Ambulatory Visit: Payer: Self-pay | Admitting: Adult Health

## 2019-05-16 NOTE — Progress Notes (Signed)
Subjective:    Patient ID: Jacob Harrison, male    DOB: 07-Jan-1941, 79 y.o.   MRN: [id number]  HPI:  Mr. Sheu is here for regular f/u: HTN, HLD Ambulatory BP SBP 130-140s DBP 70-80s HR 60-80s Per Cards BP goal <130/80- today BP 142/83  He denies acute cardiac sx's He is followed by Cardiology Q6M- next OV 08/2019 AM BG 115-125 He denies episodes of hypoglycemia He is taking Metformin 500mg  1/2 tab QD CMP drawn today Mg++ level normal in Nov 2020 He reports a heart healthy/diabetic diet and drinking plenty of fluid throughout the day. He enjoys walking his dog daily 15-20 mins/night, however not when the during the cold winter months.  He had first [place] COVID-19 Vaccine 05/11/2019- second dose scheduled at end of Feb  Lab Results  Component Value Date   HGBA1C 5.8 (A) 05/17/2019   HGBA1C 5.8 (A) 02/22/2019   HGBA1C 5.2 12/01/2018    Patient Care Team    Relationship Specialty Notifications Start End  Esaw Grandchild, NP PCP - General Family Medicine  05/20/16   End, Harrell Gave, MD PCP - Cardiology Cardiology Admissions 10/28/17     Patient Active Problem List   Diagnosis Date Noted  . Sinusitis 04/27/2019  . BPH associated with nocturia 11/29/2018  . 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 Encounter for Medicare annual wellness exam 08/17/2018  . Spinal stenosis of lumbar region 08/02/2018  . Flu-like symptoms 05/05/2018  . Bacterial conjunctivitis of both eyes 04/27/2018  . Atypical mole 07/30/2017  . Healthcare maintenance 07/30/2017  . Wheezing 07/21/2017  . Acute bronchitis 07/21/2017  . Neck pain 06/08/2017  . Sinus bradycardia 03/18/2017  . Screening for colon cancer 02/25/2017  . Ventral hernia without obstruction or gangrene 02/25/2017  . Chronic heart failure with preserved ejection fraction ([place]) 09/28/2016  . Ischemic cardiomyopathy 09/28/2016  . Cough in adult 07/22/2016  . Vitamin D deficiency  06/27/2016  . Vitamin B deficiency 06/27/2016  . Hyperlipidemia LDL goal <70 06/21/2016  . Weakness of both lower extremities 06/21/2016  . Diabetes mellitus without complication ([place]) Q000111Q  . Essential hypertension 05/20/2016  . Hyperlipidemia associated with type 2 diabetes mellitus ([place]) 05/20/2016  . Coronary artery disease of native artery of native heart with stable angina pectoris ([place]) 05/20/2016     Past Medical History:  Diagnosis Date  . (HFpEF) heart failure with preserved ejection fraction ([place])    a. 10/2016 Echo: EF 65-70%, mild LVH, mildly dil LA. RV fxn nl.  . Arthritis   . BPH (benign prostatic hyperplasia)   . Coronary artery disease    a. 2001 CABG x 3 [facility]): LIMA->LAD, VG->LCX, VG->RPDA; b. 04/05/2010 Cath ([place]): LM 16m, LAD 1110m, LCX 52m, RCA 100p, LIMA->LAD atretic, VG->LCX 100, VG->RCA 80ost, 70d; c. 04/2010 Redo CABG x 2 ([place]): VG->LAD, VG->RPDA; d. 10/2012 Cath [facility]): LM 20-30, LAD 100/95-53m, LCX 40-103m, RCA 100p, 80-100d, RPDA 99, RPL1 95, VG->LAD ok, VG->RPDA ok, EF 67%; e. 11/2016 MV: EF 48%, No ischemia.  . 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 Hyperlipidemia   . Hypertension   . Scoliosis   . Spinal stenosis      Past Surgical History:  Procedure Laterality Date  . CARDIAC CATHETERIZATION    . CORONARY ARTERY BYPASS GRAFT     x 2 (2001 and redo in late 2011 or early 2012)  . HERNIA REPAIR Left    inguinal  .  LUMBAR LAMINECTOMY/DECOMPRESSION MICRODISCECTOMY N/A 10/20/2018   Procedure: L3-4, L4-5 LUMBAR DECOMPRESSION with dural repair.;  Surgeon: Marybelle Killings, MD;  Location: [place];  Service: Orthopedics;  Laterality: N/A;  . TONSILLECTOMY AND ADENOIDECTOMY    . TRANSURETHRAL RESECTION OF PROSTATE  1990     Family History  Problem Relation Age of Onset  . Diabetes Mother   . Hyperlipidemia Mother   . Cancer Father        colon  . Alcohol abuse Maternal Uncle   . Diabetes Maternal Uncle   .  Hyperlipidemia Maternal Uncle   . Cancer Paternal Uncle        colon     Social History   Substance and Sexual Activity  Drug Use No     Social History   Substance and Sexual Activity  Alcohol Use Yes  . Alcohol/week: 11.0 standard drinks  . Types: 5 Glasses of wine, 6 Cans of beer per week     Social History   Tobacco Use  Smoking Status Never Smoker  Smokeless Tobacco Never Used     Outpatient Encounter Medications as of 05/17/2019  Medication Sig Note  . amLODipine (NORVASC) 5 MG tablet Take 1 tablet (5 mg total) by mouth daily.   Marland Kitchen aspirin EC 81 MG tablet Take 1 tablet (81 mg total) by mouth daily.   . carvedilol (COREG) 6.25 MG tablet Take 0.5 tablets (3.125 mg total) by mouth 2 (two) times daily.   . Cholecalciferol (VITAMIN D-3 PO) Take 800 Units by mouth daily.   . Coenzyme Q10 (CO Q-10) 200 MG CAPS Take 200 mg by mouth daily.    . cyanocobalamin 500 MCG tablet Take 500 mcg by mouth daily.   . diclofenac Sodium (VOLTAREN) 1 % GEL Apply 2 g topically 2 (two) times daily as needed.   . ezetimibe (ZETIA) 10 MG tablet Take 1 tablet (10 mg total) by mouth daily. **PATIENT NEEDS APT FOR FURTHER REFILLS**   . finasteride (PROSCAR) 5 MG tablet Take 1 tablet (5 mg total) by mouth daily. **PATIENT NEEDS APT FOR FURTHER REFILLS**   . fluticasone (FLONASE) 50 MCG/ACT nasal spray Place 2 sprays into both nostrils daily.   . Glucosamine-Chondroit-Vit C-Mn (GLUCOSAMINE 1500 COMPLEX) CAPS Take 1 capsule by mouth daily.   . Lancets (ONETOUCH ULTRASOFT) lancets Use to check blood sugars fasting daily   . metFORMIN (GLUCOPHAGE) 500 MG tablet 1/2 tablet by mouth daily with food   . Multiple Vitamins-Minerals (PRESERVISION AREDS 2 PO) Take 1 capsule by mouth 2 (two) times a day.   . nitroGLYCERIN (NITROSTAT) 0.4 MG SL tablet Place 1 tablet (0.4 mg total) under the tongue as needed. 10/20/2018: Hasnt needed   . ranolazine (RANEXA) 1000 MG SR tablet TAKE 1  BY MOUTH TWICE DAILY   .  rosuvastatin (CRESTOR) 20 MG tablet TAKE 1 TABLET BY MOUTH AT BEDTIME   . [DISCONTINUED] ezetimibe (ZETIA) 10 MG tablet Take 1 tablet (10 mg total) by mouth daily. **PATIENT NEEDS APT FOR FURTHER REFILLS**   . [DISCONTINUED] finasteride (PROSCAR) 5 MG tablet Take 1 tablet (5 mg total) by mouth daily. **PATIENT NEEDS APT FOR FURTHER REFILLS**   . [DISCONTINUED] amoxicillin-clavulanate (AUGMENTIN) 875-125 MG tablet Take 1 tablet by mouth 2 (two) times daily. (Patient not taking: Reported on 05/17/2019)    Facility-Administered Encounter Medications as of 05/17/2019  Medication  . ipratropium-albuterol (DUONEB) 0.5-2.5 (3) MG/3ML nebulizer solution 3 mL    Allergies: Isosorbide mononitrate [isosorbide dinitrate er] and Tetracyclines & related  Body mass index is 32.25 kg/m.  Blood pressure (!) 142/83, pulse 66, temperature 97.8 F (36.6 C), temperature source Oral, resp. rate 14, height 5\' 7"  (1.702 m), weight 205 lb 14.4 oz (93.4 kg), SpO2 94 %.  Review of Systems  Constitutional: Positive for fatigue. Negative for activity change, appetite change, chills, diaphoresis, fever and unexpected weight change.  HENT: Negative for congestion.   Eyes: Negative for visual disturbance.  Respiratory: Negative for cough, chest tightness, shortness of breath, wheezing and stridor.   Cardiovascular: Negative for chest pain, palpitations and leg swelling.  Endocrine: Negative for polydipsia, polyphagia and polyuria.  Genitourinary: Negative for difficulty urinating and flank pain.  Musculoskeletal: Positive for arthralgias.  Neurological: Negative for dizziness and headaches.  Hematological: Negative for adenopathy. Does not bruise/bleed easily.  Psychiatric/Behavioral: Negative for agitation, behavioral problems, confusion, decreased concentration, dysphoric mood, hallucinations, self-injury, sleep disturbance and suicidal ideas. The patient is not nervous/anxious and is not hyperactive.         Objective:   Physical Exam Vitals and nursing note reviewed.  Constitutional:      General: He is not in acute distress.    Appearance: Normal appearance. He is obese. He is not ill-appearing, toxic-appearing or diaphoretic.  HENT:     Head: Normocephalic and atraumatic.  Eyes:     Extraocular Movements: Extraocular movements intact.     Conjunctiva/sclera: Conjunctivae normal.     Pupils: Pupils are equal, round, and reactive to light.  Cardiovascular:     Rate and Rhythm: Normal rate and regular rhythm.     Pulses: Normal pulses.     Heart sounds: Murmur present. No gallop.   Pulmonary:     Effort: Pulmonary effort is normal. No respiratory distress.     Breath sounds: Normal breath sounds. No stridor. No wheezing, rhonchi or rales.  Chest:     Chest wall: No tenderness.  Skin:    General: Skin is warm and dry.     Capillary Refill: Capillary refill takes less than 2 seconds.  Neurological:     Mental Status: He is alert and oriented to person, place, and time.  Psychiatric:        Mood and Affect: Mood normal.        Behavior: Behavior normal.        Thought Content: Thought content normal.        Judgment: Judgment normal.       Assessment & Plan:   1. Diabetes mellitus without complication ([place])   2. Healthcare maintenance   3. Hyperlipidemia LDL goal <70   4. Essential hypertension   5. BPH associated with nocturia     Healthcare maintenance A1c is 5.8 today. Blood pressure stable. Continue all medications as directed. Remain well hydrated, follow diabetic diet. Continue to social distance and wear a mask when in public. Complete second COVID-19 vaccine at end of Feb. We will contact you with lab results. Follow-up with primary care in 3 months.  Hyperlipidemia LDL goal <70 08/10/2018 Lipid Panel Cholesterol, Total 100 - 199 mg/dL 140   Triglycerides 0 - 149 mg/dL 145   HDL >39 mg/dL 49   VLDL Cholesterol Cal 5 - 40 mg/dL 29   LDL Calculated 0 - 99  mg/dL 62    Continue Zetia 10mg  QD, Rosuvastatin 20mg  QD  Diabetes mellitus without complication (HCC) Lab Results  Component Value Date   HGBA1C 5.8 (A) 05/17/2019   HGBA1C 5.8 (A) 02/22/2019   HGBA1C 5.2 12/01/2018  AM  BG 115-125 He denies episodes of hypoglycemia He is taking Metformin 500mg  1/2 tab QD CMP drawn today Mg++ level normal in Nov 2020  Essential hypertension Ambulatory BP SBP 130-140s DBP 70-80s HR 60-80s Per Cards BP goal <130/80- today BP 142/83  He denies acute cardiac sx's He is followed by Cardiology Q6M- next OV 08/2019 He is currently on Amlodipine 5mg  QD, Carvedilol 6.25mg  1/2 tab BID  BPH associated with nocturia Well controlled on Finasteride 5mg  QD- refill provided today    FOLLOW-UP:  Return in about 3 months (around 08/14/2019) for Regular Follow Up, HTN, Hypercholestermia, Diabetes.

## 2019-05-17 ENCOUNTER — Other Ambulatory Visit: Payer: Self-pay

## 2019-05-17 ENCOUNTER — Ambulatory Visit (INDEPENDENT_AMBULATORY_CARE_PROVIDER_SITE_OTHER): Payer: Medicare Other | Admitting: Adult Health

## 2019-05-17 ENCOUNTER — Encounter: Payer: Self-pay | Admitting: Adult Health

## 2019-05-17 VITALS — BP 142/83 | HR 66 | Temp 97.8°F | Resp 14 | Ht 67.0 in | Wt 205.9 lb

## 2019-05-17 DIAGNOSIS — I10 Essential (primary) hypertension: Secondary | ICD-10-CM

## 2019-05-17 DIAGNOSIS — Z0000 Encounter for general adult medical examination without abnormal findings: Secondary | ICD-10-CM | POA: Diagnosis not present

## 2019-05-17 DIAGNOSIS — R351 Nocturia: Secondary | ICD-10-CM

## 2019-05-17 DIAGNOSIS — E785 Hyperlipidemia, unspecified: Secondary | ICD-10-CM | POA: Diagnosis not present

## 2019-05-17 DIAGNOSIS — E119 Type 2 diabetes mellitus without complications: Secondary | ICD-10-CM | POA: Diagnosis not present

## 2019-05-17 DIAGNOSIS — N401 Enlarged prostate with lower urinary tract symptoms: Secondary | ICD-10-CM

## 2019-05-17 LAB — POCT GLYCOSYLATED HEMOGLOBIN (HGB A1C): Hemoglobin A1C: 5.8 % — AB (ref 4.0–5.6)

## 2019-05-17 MED ORDER — EZETIMIBE 10 MG PO TABS: 10.0000 mg | ORAL_TABLET | Freq: Every day | ORAL | 0 refills | Status: DC

## 2019-05-17 MED ORDER — FINASTERIDE 5 MG PO TABS: 5.0000 mg | ORAL_TABLET | Freq: Every day | ORAL | 0 refills | Status: DC

## 2019-05-17 NOTE — Patient Instructions (Signed)

## 2019-05-17 NOTE — Assessment & Plan Note (Signed)
Lab Results  Component Value Date   HGBA1C 5.8 (A) 05/17/2019   HGBA1C 5.8 (A) 02/22/2019   HGBA1C 5.2 12/01/2018  AM BG 115-125 He denies episodes of hypoglycemia He is taking Metformin 500mg  1/2 tab QD CMP drawn today Mg++ level normal in Nov 2020

## 2019-05-17 NOTE — Assessment & Plan Note (Signed)
08/10/2018 Lipid Panel Cholesterol, Total 100 - 199 mg/dL 140   Triglycerides 0 - 149 mg/dL 145   HDL >39 mg/dL 49   VLDL Cholesterol Cal 5 - 40 mg/dL 29   LDL Calculated 0 - 99 mg/dL 62    Continue Zetia 10mg  QD, Rosuvastatin 20mg  QD

## 2019-05-17 NOTE — Assessment & Plan Note (Signed)
>>  ASSESSMENT AND PLAN FOR MIXED HYPERLIPIDEMIA WRITTEN ON 05/17/2019  9:31 AM BY DANFORD, KATY D, NP  08/10/2018 Lipid Panel Cholesterol, Total 100 - 199 mg/dL 161   Triglycerides 0 - 149 mg/dL 096   HDL >04 mg/dL 49   VLDL Cholesterol Cal 5 - 40 mg/dL 29   LDL Calculated 0 - 99 mg/dL 62    Continue Zetia 10mg  QD, Rosuvastatin 20mg  QD

## 2019-05-17 NOTE — Assessment & Plan Note (Signed)
Ambulatory BP SBP 130-140s DBP 70-80s HR 60-80s Per Cards BP goal <130/80- today BP 142/83  He denies acute cardiac sx's He is followed by Cardiology Q6M- next OV 08/2019 He is currently on Amlodipine 5mg  QD, Carvedilol 6.25mg  1/2 tab BID

## 2019-05-17 NOTE — Assessment & Plan Note (Signed)
A1c is 5.8 today. Blood pressure stable. Continue all medications as directed. Remain well hydrated, follow diabetic diet. Continue to social distance and wear a mask when in public. Complete second COVID-19 vaccine at end of Feb. We will contact you with lab results. Follow-up with primary care in 3 months.

## 2019-05-17 NOTE — Assessment & Plan Note (Signed)
Well controlled on Finasteride 5mg  QD- refill provided today

## 2019-05-18 ENCOUNTER — Encounter: Payer: Self-pay | Admitting: Adult Health

## 2019-05-18 LAB — COMPREHENSIVE METABOLIC PANEL
ALT: 26 IU/L (ref 0–44)
AST: 20 IU/L (ref 0–40)
Albumin/Globulin Ratio: 2 (ref 1.2–2.2)
Albumin: 4.6 g/dL (ref 3.7–4.7)
Alkaline Phosphatase: 93 IU/L (ref 39–117)
BUN/Creatinine Ratio: 17 (ref 10–24)
BUN: 13 mg/dL (ref 8–27)
Bilirubin Total: 0.8 mg/dL (ref 0.0–1.2)
CO2: 22 mmol/L (ref 20–29)
Calcium: 10 mg/dL (ref 8.6–10.2)
Chloride: 100 mmol/L (ref 96–106)
Creatinine, Ser: 0.75 mg/dL — ABNORMAL LOW (ref 0.76–1.27)
GFR calc Af Amer: 103 mL/min/{1.73_m2} (ref >59)
GFR calc non Af Amer: 89 mL/min/{1.73_m2} (ref >59)
Globulin, Total: 2.3 g/dL (ref 1.5–4.5)
Glucose: 100 mg/dL — ABNORMAL HIGH (ref 65–99)
Potassium: 4.5 mmol/L (ref 3.5–5.2)
Sodium: 139 mmol/L (ref 134–144)
Total Protein: 6.9 g/dL (ref 6.0–8.5)

## 2019-06-03 ENCOUNTER — Other Ambulatory Visit: Payer: Self-pay | Admitting: Adult Health

## 2019-07-10 ENCOUNTER — Other Ambulatory Visit: Payer: Self-pay | Admitting: Internal Medicine

## 2019-07-10 DIAGNOSIS — I25118 Atherosclerotic heart disease of native coronary artery with other forms of angina pectoris: Secondary | ICD-10-CM

## 2019-08-09 ENCOUNTER — Ambulatory Visit: Payer: Medicare Other | Admitting: Orthopaedic Surgery

## 2019-08-09 ENCOUNTER — Ambulatory Visit: Payer: Self-pay

## 2019-08-09 ENCOUNTER — Encounter: Payer: Self-pay | Admitting: Orthopaedic Surgery

## 2019-08-09 ENCOUNTER — Other Ambulatory Visit: Payer: Self-pay

## 2019-08-09 VITALS — BP 149/81 | HR 70 | Ht 67.0 in | Wt 200.0 lb

## 2019-08-09 DIAGNOSIS — M25512 Pain in left shoulder: Secondary | ICD-10-CM

## 2019-08-09 DIAGNOSIS — M542 Cervicalgia: Secondary | ICD-10-CM

## 2019-08-09 DIAGNOSIS — M67912 Unspecified disorder of synovium and tendon, left shoulder: Secondary | ICD-10-CM | POA: Diagnosis not present

## 2019-08-09 DIAGNOSIS — G8929 Other chronic pain: Secondary | ICD-10-CM

## 2019-08-09 DIAGNOSIS — M67919 Unspecified disorder of synovium and tendon, unspecified shoulder: Secondary | ICD-10-CM | POA: Insufficient documentation

## 2019-08-09 NOTE — Progress Notes (Signed)
Office Visit Note   Patient: Jacob Harrison           Date of Birth: [birth year]           MRN: [id number] Visit Date: 08/09/2019              Requested by: No referring provider defined for this encounter. PCP: Esaw Grandchild, NP   Assessment & Plan: Visit Diagnoses:  1. Neck pain   2. Chronic left shoulder pain   3. Disorder of left rotator cuff    Plan: Patient symptoms are not severe enough to consider operative intervention.  He can avoid abducted positions in his shoulder that bother him.  If he develops progressive numbness in the upper extremities or weakness he can return.  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  Follow-Up Instructions: Return if symptoms worsen or fail to improve.   Orders:  Orders Placed This Encounter  Procedures  . XR Cervical Spine 2 or 3 views  . XR Shoulder Left   No orders of the defined types were placed in this encounter.     Procedures: No procedures performed   Clinical Data: No additional findings.   Subjective: Chief Complaint  Patient presents with  . Neck - Pain  . Left Shoulder - Pain    HPI 79 year old male with diabetes on Metformin A1c 5.8 is here with problems with left shoulder pain x6 months.  He is right-hand dominant noticed he said pain with abduction of his arm and can get his arm up in a flexed position but not abducted.  No problems with his right shoulder.  He has noticed decreased range of motion of his neck with crepitus.  No weakness in his hands no gait disturbance related to his neck.  Patient is used occasional Aleve tries to avoid it with his history of heart problems.  He is used some topical creams, arthritis gel. Review of Systems positive for hypertension BPH history of heart failure coronary artery disease.  Diabetes on Metformin.   Objective: Vital Signs: BP (!) 149/81   Pulse 70   Ht 5\' 7"  (1.702 m)   Wt 200 lb (90.7 kg)   BMI 31.32 kg/m   Physical  Exam Constitutional:      Appearance: He is well-developed.  HENT:     Head: Normocephalic and atraumatic.  Eyes:     Pupils: Pupils are equal, round, and reactive to light.  Neck:     Thyroid: No thyromegaly.     Trachea: No tracheal deviation.  Cardiovascular:     Rate and Rhythm: Normal rate.  Pulmonary:     Effort: Pulmonary effort is normal.     Breath sounds: No wheezing.  Abdominal:     General: Bowel sounds are normal.     Palpations: Abdomen is soft.  Skin:    General: Skin is warm and dry.     Capillary Refill: Capillary refill takes less than 2 seconds.  Neurological:     Mental Status: He is alert and oriented to person, place, and time.  Psychiatric:        Behavior: Behavior normal.        Thought Content: Thought content normal.        Judgment: Judgment normal.     Ortho Exam patient is 50% rotation cervical spine.  Some brachial plexus tenderness pain with resisted abduction which reproduces pain on the left negative on the right.  He can get his arm over  his head in the flexed position.  Positive Neer negative Hawkins Long head of the biceps is normal.  Specialty Comments:  No specialty comments available.  Imaging: XR Cervical Spine 2 or 3 views  Result Date: 08/09/2019 AP lateral cervical spine x-rays are obtained and reviewed.  This shows multilevel cervical spondylosis with endplate spurring some calcification anteriorly multiple levels C3-C7 and facet arthropathy multiple levels. 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  XR Shoulder Left  Result Date: 08/09/2019 3 views left shoulder obtained in reviewed.  This shows calcific tendinopathy on the bursal surface of rotator cuff that extends 3.5 cm from the insertion site greater tuberosity.  No high riding head no glenohumeral arthritis. 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    PMFS History: Patient Active Problem List    Diagnosis Date Noted  . Rotator cuff disorder 08/09/2019  . Sinusitis 04/27/2019  . BPH associated with nocturia 11/29/2018  . 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 Encounter for Medicare annual wellness exam 08/17/2018  . Spinal stenosis of lumbar region 08/02/2018  . Flu-like symptoms 05/05/2018  . Bacterial conjunctivitis of both eyes 04/27/2018  . Atypical mole 07/30/2017  . Healthcare maintenance 07/30/2017  . Wheezing 07/21/2017  . Acute bronchitis 07/21/2017  . Neck pain 06/08/2017  . Sinus bradycardia 03/18/2017  . Screening for colon cancer 02/25/2017  . Ventral hernia without obstruction or gangrene 02/25/2017  . Chronic heart failure with preserved ejection fraction ([place]) 09/28/2016  . Ischemic cardiomyopathy 09/28/2016  . Cough in adult 07/22/2016  . Vitamin D deficiency 06/27/2016  . Vitamin B deficiency 06/27/2016  . Hyperlipidemia LDL goal <70 06/21/2016  . Weakness of both lower extremities 06/21/2016  . Diabetes mellitus without complication ([place]) [id number]  . Essential hypertension 05/20/2016  . Hyperlipidemia associated with type 2 diabetes mellitus ([place]) 05/20/2016  . Coronary artery disease of native artery of native heart with stable angina pectoris ([place]) 05/20/2016   Past Medical History:  Diagnosis Date  . (HFpEF) heart failure with preserved ejection fraction ([place])    a. 10/2016 Echo: EF 65-70%, mild LVH, mildly dil LA. RV fxn nl.  . Arthritis   . BPH (benign prostatic hyperplasia)   . Coronary artery disease    a. 2001 CABG x 3 [facility]): LIMA->LAD, VG->LCX, VG->RPDA; b. 04/05/2010 Cath ([place]): LM 23m, LAD 135m, LCX 50m, RCA 100p, LIMA->LAD atretic, VG->LCX 100, VG->RCA 80ost, 70d; c. 04/2010 Redo CABG x 2 ([place]): VG->LAD, VG->RPDA; d. 10/2012 Cath [facility]): LM 20-30, LAD 100/95-17m, LCX 40-28m, RCA 100p, 80-100d, RPDA 99, RPL1 95, VG->LAD ok, VG->RPDA ok, EF 67%; e. 11/2016 MV: EF 48%, No ischemia.  .  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 Hyperlipidemia   . Hypertension   . Scoliosis   . Spinal stenosis     Family History  Problem Relation Age of Onset  . Diabetes Mother   . Hyperlipidemia Mother   . Cancer Father        colon  . Alcohol abuse Maternal Uncle   . Diabetes Maternal Uncle   . Hyperlipidemia Maternal Uncle   . Cancer Paternal Uncle        colon    Past Surgical History:  Procedure Laterality Date  . CARDIAC CATHETERIZATION    . CORONARY ARTERY BYPASS GRAFT     x 2 (2001 and redo in late 2011 or early 2012)  . HERNIA REPAIR Left  inguinal  . LUMBAR LAMINECTOMY/DECOMPRESSION MICRODISCECTOMY N/A 10/20/2018   Procedure: L3-4, L4-5 LUMBAR DECOMPRESSION with dural repair.;  Surgeon: Marybelle Killings, MD;  Location: [facility];  Service: Orthopedics;  Laterality: N/A;  . TONSILLECTOMY AND ADENOIDECTOMY    . TRANSURETHRAL RESECTION OF PROSTATE  1990   Social History   Occupational History  . Not on file  Tobacco Use  . Smoking status: Never Smoker  . Smokeless tobacco: Never Used  Substance and Sexual Activity  . Alcohol use: Yes    Alcohol/week: 11.0 standard drinks    Types: 5 Glasses of wine, 6 Cans of beer per week  . Drug use: No  . Sexual activity: Not Currently    Birth control/protection: None

## 2019-08-14 NOTE — Progress Notes (Signed)
Established Patient Office Visit  Subjective:  Patient ID: Jacob Harrison, male    DOB: 27-Apr-1940  Age: 79 y.o. MRN: [id number]  CC:  Chief Complaint  Patient presents with  . Hypertension  . Hyperlipidemia  . Diabetes    HPI Jacob Harrison presents for 3 month chronic follow-up.  HTN: Pt denies chest pain, palpitations, or dizziness. Taking medication as directed without side effects. Checks BP at home once daily and readings have been at goal <130/80 ever since his cardiologist increased amlodipine to 5 mg. Denies increased lower extremity swelling from baseline. Reports since it is warmer has been more active outside and has noticed shortness of breath with exertion that lasts 5-10 minutes and resting makes it better. Denies syncope. States he has a follow-up appt. with his cardiologist 08/24/19.  HLD: Pt taking medication as directed without issues. Denies side effects including myalgias and RUQ pain. He takes Co Q-10 which has helped to prevent myalgias. He is more active doing yard-work.    Diabetes: Pt denies increased urination or thirst. Pt reports medication compliance. No hypoglycemic events. Checking glucose at home. FBS range in 115-130.  BPH: Under controlled with medication.    Past Medical History:  Diagnosis Date  . (HFpEF) heart failure with preserved ejection fraction ([place])    a. 10/2016 Echo: EF 65-70%, mild LVH, mildly dil LA. RV fxn nl.  . Arthritis   . BPH (benign prostatic hyperplasia)   . Coronary artery disease    a. 2001 CABG x 3 [facility]): LIMA->LAD, VG->LCX, VG->RPDA; b. 04/05/2010 Cath ([place]): LM 90m LAD 1014mLCX 4070mCA 100p, LIMA->LAD atretic, VG->LCX 100, VG->RCA 80ost, 70d; c. 04/2010 Redo CABG x 2 ([place]): VG->LAD, VG->RPDA; d. 10/2012 Cath ([facility] 20-30, LAD 100/95-20m67mX 40-65m,64m 100p, 80-100d, RPDA 99, RPL1 95, VG->LAD ok, VG->RPDA ok, EF 67%; e. 11/2016 MV: EF 48%, No ischemia.  . Diabetes mellitus without  complication (HCC)    Type II  . Hyperlipidemia   . Hypertension   . Scoliosis   . Spinal stenosis     Past Surgical History:  Procedure Laterality Date  . CARDIAC CATHETERIZATION    . CORONARY ARTERY BYPASS GRAFT     x 2 (2001 and redo in late 2011 or early 2012)  . HERNIA REPAIR Left    inguinal  . LUMBAR LAMINECTOMY/DECOMPRESSION MICRODISCECTOMY N/A 10/20/2018   Procedure: L3-4, L4-5 LUMBAR DECOMPRESSION with dural repair.;  Surgeon: YatesMarybelle Killings  [facility];  Laterality: N/A;  . TONSILLECTOMY AND ADENOIDECTOMY    . TRANSURETHRAL RESECTION OF PROSTATE  1990    Family History  Problem Relation Age of Onset  . Diabetes Mother   . Hyperlipidemia Mother   . Cancer Father        colon  . Alcohol abuse Maternal Uncle   . Diabetes Maternal Uncle   . Hyperlipidemia Maternal Uncle   . Cancer Paternal Uncle        colon    Social History   Socioeconomic History  . Marital status: Married    Spouse name: Not on file  . Number of children: Not on file  . Years of education: Not on file  . Highest education level: Not on file  Occupational History  . Not on file  Tobacco Use  . Smoking status: Never Smoker  . Smokeless tobacco: Never Used  Substance and Sexual Activity  . Alcohol use: Yes    Alcohol/week: 11.0 standard drinks  Types: 5 Glasses of wine, 6 Cans of beer per week  . Drug use: No  . Sexual activity: Not Currently    Birth control/protection: None  Other Topics Concern  . Not on file  Social History Narrative  . Not on file   Social Determinants of Health   Financial Resource Strain:   . Difficulty of Paying Living Expenses:   Food Insecurity:   . Worried About Charity fundraiser in the Last Year:   . Arboriculturist in the Last Year:   Transportation Needs:   . Film/video editor (Medical):   Marland Kitchen Lack of Transportation (Non-Medical):   Physical Activity:   . Days of Exercise per Week:   . Minutes of  Exercise per Session:   Stress:   . Feeling of Stress :   Social Connections:   . Frequency of Communication with Friends and Family:   . Frequency of Social Gatherings with Friends and Family:   . Attends Religious Services:   . Active Member of Clubs or Organizations:   . Attends Archivist Meetings:   Marland Kitchen Marital Status:   Intimate Partner Violence:   . Fear of Current or Ex-Partner:   . Emotionally Abused:   Marland Kitchen Physically Abused:   . Sexually Abused:     Outpatient Medications Prior to Visit  Medication Sig Dispense Refill  . amLODipine (NORVASC) 5 MG tablet Take 1 tablet (5 mg total) by mouth daily. 90 tablet 3  . aspirin EC 81 MG tablet Take 1 tablet (81 mg total) by mouth daily.    . carvedilol (COREG) 6.25 MG tablet Take 0.5 tablets (3.125 mg total) by mouth 2 (two) times daily. 180 tablet 0  . Cholecalciferol (VITAMIN D-3 PO) Take 800 Units by mouth daily.    . Coenzyme Q10 (CO Q-10) 200 MG CAPS Take 200 mg by mouth daily.     . cyanocobalamin 500 MCG tablet Take 500 mcg by mouth daily.    . diclofenac Sodium (VOLTAREN) 1 % GEL Apply 2 g topically 2 (two) times daily as needed. 120 g 0  . ezetimibe (ZETIA) 10 MG tablet Take 1 tablet (10 mg total) by mouth daily. **PATIENT NEEDS APT FOR FURTHER REFILLS** 90 tablet 0  . Glucosamine-Chondroit-Vit C-Mn (GLUCOSAMINE 1500 COMPLEX) CAPS Take 1 capsule by mouth daily.    . Lancets (ONETOUCH ULTRASOFT) lancets Use to check blood sugars fasting daily 100 each 12  . metFORMIN (GLUCOPHAGE) 500 MG tablet 1/2 tablet by mouth daily with food 90 tablet 2  . Multiple Vitamins-Minerals (PRESERVISION AREDS 2 PO) Take 1 capsule by mouth 2 (two) times a day.    . nitroGLYCERIN (NITROSTAT) 0.4 MG SL tablet Place 1 tablet (0.4 mg total) under the tongue as needed. 25 tablet 3  . ranolazine (RANEXA) 1000 MG SR tablet TAKE 1  BY MOUTH TWICE DAILY 180 tablet 0  . rosuvastatin (CRESTOR) 20 MG tablet TAKE 1 TABLET BY MOUTH AT BEDTIME 90 tablet 0   . finasteride (PROSCAR) 5 MG tablet Take 1 tablet (5 mg total) by mouth daily. **PATIENT NEEDS APT FOR FURTHER REFILLS** 90 tablet 0  . fluticasone (FLONASE) 50 MCG/ACT nasal spray Place 2 sprays into both nostrils daily. 16 g 0   Facility-Administered Medications Prior to Visit  Medication Dose Route Frequency Provider Last Rate Last Admin  . ipratropium-albuterol (DUONEB) 0.5-2.5 (3) MG/3ML nebulizer solution 3 mL  3 mL Nebulization Q6H Danford, Katy D, NP   3 mL at  07/21/17 1646    Allergies  Allergen Reactions  . Isosorbide Mononitrate [Isosorbide Dinitrate Er]     Headaches  . Tetracyclines & Related Rash    ROS  Review of Systems:  A fourteen system review of systems was performed and found to be positive as per HPI.   Objective:    Physical Exam  General:  Well Developed, well nourished, appropriate for stated age.  Neuro:  Alert and oriented,  extra-ocular muscles intact  HEENT:  Normocephalic, atraumatic, neck supple Skin:  no gross rash, warm, pink. Cardiac:  RRR, murmur noted. Respiratory:  ECTA B/L, Not using accessory muscles, speaking in full sentences- unlabored. Vascular:  Ext warm, no cyanosis apprec.; cap RF less 2 sec. Minimal non-pitting edema noted B/L LE. Psych:  No HI/SI, judgement and insight good, Euthymic mood. Full Affect.   BP 120/73   Pulse 66   Temp 98.1 F (36.7 C) (Oral)   Ht _0  (1.702 m)   Wt 206 lb (93.4 kg)   SpO2 94%   BMI 32.26 kg/m  Wt Readings from Last 3 Encounters:  08/15/19 206 lb (93.4 kg)  08/09/19 200 lb (90.7 kg)  05/17/19 205 lb 14.4 oz (93.4 kg)     Health Maintenance Due  Topic Date Due  . COVID-19 Vaccine (1) Never done    There are no preventive care reminders to display for this patient.  Lab Results  Component Value Date   TSH 1.830 02/25/2019   Lab Results  Component Value Date   WBC 19.1 (H) 10/21/2018   HGB 11.8 (L) 10/21/2018   HCT 34.6 (L) 10/21/2018   MCV 91.5 10/21/2018   PLT 202  10/21/2018   Lab Results  Component Value Date   NA 139 05/17/2019   K 4.5 05/17/2019   CO2 22 05/17/2019   GLUCOSE 100 (H) 05/17/2019   BUN 13 05/17/2019   CREATININE 0.75 (L) 05/17/2019   BILITOT 0.8 05/17/2019   ALKPHOS 93 05/17/2019   AST 20 05/17/2019   ALT 26 05/17/2019   PROT 6.9 05/17/2019   ALBUMIN 4.6 05/17/2019   CALCIUM 10.0 05/17/2019   ANIONGAP 12 10/21/2018   Lab Results  Component Value Date   CHOL 140 08/10/2018   Lab Results  Component Value Date   HDL 49 08/10/2018   Lab Results  Component Value Date   LDLCALC 62 08/10/2018   Lab Results  Component Value Date   TRIG 145 08/10/2018   Lab Results  Component Value Date   CHOLHDL 2.9 08/10/2018   Lab Results  Component Value Date   HGBA1C 5.5 08/15/2019      Assessment & Plan:   Problem List Items Addressed This Visit      Cardiovascular and Mediastinum   Essential hypertension (Chronic)   Relevant Orders   Comp Met (CMET)   Coronary artery disease of native artery of native heart with stable angina pectoris ([place])     Endocrine   Diabetes mellitus without complication ([place]) - Primary (Chronic)   Hyperlipidemia associated with type 2 diabetes mellitus (HCC) (Chronic)   Relevant Orders   Lipid Profile   Comp Met (CMET)     Other   BPH associated with nocturia (Chronic)   Relevant Medications   finasteride (PROSCAR) 5 MG tablet     Diabetes: - A1c today is 5.5, stable. - Continue Metformin 500 mg 0.5 tablet once daily. -Continue ambulatory glucose monitoring and notify clinic if FBS consistently <80 or >160. - Encourage low  glucose/carbohydrate diet and stay as active as possible.  HTN: - BP today is 120/73, at goal <130/80 - Continue Amlodipine 5 mg and Carvedilol. - Continue ambulatory BP and pulse monitoring and keep a log. - Continue DASH diet.  HLD: - Last lipid panel wnl's. - Continue Zetia and Crestor. - Continue heart healthy diet. - Will recheck lipid panel and  hepatic function today.  CAD: - Recommend to discuss with cardiologist recent onset of dyspnea with exertion. Pt denies chest pain, syncope, dizziness, or palpitations.  - Keep cardiology follow-up appointment as scheduled.   BPH: - Stable, refill provided of finasteride.  - Will continue to monitor.   Meds ordered this encounter  Medications  . finasteride (PROSCAR) 5 MG tablet    Sig: Take 1 tablet (5 mg total) by mouth daily.    Dispense:  90 tablet    Refill:  1    Order Specific Question:   Supervising Provider    Answer:   Beatrice Lecher D [[id number]]    Follow-up: Return for HTN, HLD, DM, BPH in 4 months.    [name], PA-C

## 2019-08-15 ENCOUNTER — Other Ambulatory Visit: Payer: Self-pay

## 2019-08-15 ENCOUNTER — Encounter: Payer: Self-pay | Admitting: Physician Assistant

## 2019-08-15 ENCOUNTER — Ambulatory Visit (INDEPENDENT_AMBULATORY_CARE_PROVIDER_SITE_OTHER): Payer: Medicare Other | Admitting: Physician Assistant

## 2019-08-15 VITALS — BP 120/73 | HR 66 | Temp 98.1°F | Ht 67.0 in | Wt 206.0 lb

## 2019-08-15 DIAGNOSIS — E1169 Type 2 diabetes mellitus with other specified complication: Secondary | ICD-10-CM | POA: Diagnosis not present

## 2019-08-15 DIAGNOSIS — N401 Enlarged prostate with lower urinary tract symptoms: Secondary | ICD-10-CM | POA: Diagnosis not present

## 2019-08-15 DIAGNOSIS — I10 Essential (primary) hypertension: Secondary | ICD-10-CM

## 2019-08-15 DIAGNOSIS — E785 Hyperlipidemia, unspecified: Secondary | ICD-10-CM | POA: Diagnosis not present

## 2019-08-15 DIAGNOSIS — I25118 Atherosclerotic heart disease of native coronary artery with other forms of angina pectoris: Secondary | ICD-10-CM | POA: Diagnosis not present

## 2019-08-15 DIAGNOSIS — E118 Type 2 diabetes mellitus with unspecified complications: Secondary | ICD-10-CM

## 2019-08-15 DIAGNOSIS — R351 Nocturia: Secondary | ICD-10-CM

## 2019-08-15 LAB — POCT GLYCOSYLATED HEMOGLOBIN (HGB A1C): Hemoglobin A1C: 5.5 % (ref 4.0–5.6)

## 2019-08-15 MED ORDER — FINASTERIDE 5 MG PO TABS: 5.0000 mg | ORAL_TABLET | Freq: Every day | ORAL | 1 refills | Status: DC

## 2019-08-15 NOTE — Patient Instructions (Signed)
Check out the DASH diet = 1.5 Gram Low Sodium Diet   A 1.5 gram sodium diet restricts the amount of sodium in the diet to no more than 1.5 g or 1500 mg daily.  The American Heart Association recommends Americans over the age of 5 to consume no more than 1500 mg of sodium each day to reduce the risk of developing high blood pressure.  Research also shows that limiting sodium may reduce heart attack and stroke risk.  Many foods contain sodium for flavor and sometimes as a preservative.  When the amount of sodium in a diet needs to be low, it is important to know what to look for when choosing foods and drinks.  The following includes some information and guidelines to help make it easier for you to adapt to a low sodium diet.    QUICK TIPS  Do not add salt to food.  Avoid convenience items and fast food.  Choose unsalted snack foods.  Buy lower sodium products, often labeled as "lower sodium" or "no salt added."  Check food labels to learn how much sodium is in 1 serving.  When eating at a restaurant, ask that your food be prepared with less salt or none, if possible.    READING FOOD LABELS FOR SODIUM INFORMATION  The nutrition facts label is a good place to find how much sodium is in foods. Look for products with no more than 400 mg of sodium per serving.  Remember that 1.5 g = 1500 mg.  The food label may also list foods as:  Sodium-free: Less than 5 mg in a serving.  Very low sodium: 35 mg or less in a serving.  Low-sodium: 140 mg or less in a serving.  Light in sodium: 50% less sodium in a serving. For example, if a food that usually has 300 mg of sodium is changed to become light in sodium, it will have 150 mg of sodium.  Reduced sodium: 25% less sodium in a serving. For example, if a food that usually has 400 mg of sodium is changed to reduced sodium, it will have 300 mg of sodium.    CHOOSING FOODS  Grains  Avoid: Salted crackers and snack items. Some cereals, including instant hot  cereals. Bread stuffing and biscuit mixes. Seasoned rice or pasta mixes.  Choose: Unsalted snack items. Low-sodium cereals, oats, puffed wheat and rice, shredded wheat. English muffins and bread. Pasta.  Meats  Avoid: Salted, canned, smoked, spiced, pickled meats, including fish and poultry. Bacon, ham, sausage, cold cuts, hot dogs, anchovies.  Choose: Low-sodium canned tuna and salmon. Fresh or frozen meat, poultry, and fish.  Dairy  Avoid: Processed cheese and spreads. Cottage cheese. Buttermilk and condensed milk. Regular cheese.  Choose: Milk. Low-sodium cottage cheese. Yogurt. Sour cream. Low-sodium cheese.  Fruits and Vegetables  Avoid: Regular canned vegetables. Regular canned tomato sauce and paste. Frozen vegetables in sauces. Olives. Angie Fava. Relishes. Sauerkraut.  Choose: Low-sodium canned vegetables. Low-sodium tomato sauce and paste. Frozen or fresh vegetables. Fresh and frozen fruit.  Condiments  Avoid: Canned and packaged gravies. Worcestershire sauce. Tartar sauce. Barbecue sauce. Soy sauce. Steak sauce. Ketchup. Onion, garlic, and table salt. Meat flavorings and tenderizers.  Choose: Fresh and dried herbs and spices. Low-sodium varieties of mustard and ketchup. Lemon juice. Tabasco sauce. Horseradish.    SAMPLE 1.5 GRAM SODIUM MEAL PLAN:   Breakfast / Sodium (mg)  1 cup low-fat milk / 143 mg  1 whole-wheat English muffin / 240 mg  1 tbs heart-healthy margarine / 153 mg  1 hard-boiled egg / 139 mg  1 small orange / 0 mg  Lunch / Sodium (mg)  1 cup raw carrots / 76 mg  2 tbs no salt added peanut butter / 5 mg  2 slices whole-wheat bread / 270 mg  1 tbs jelly / 6 mg   cup red grapes / 2 mg  Dinner / Sodium (mg)  1 cup whole-wheat pasta / 2 mg  1 cup low-sodium tomato sauce / 73 mg  3 oz lean ground beef / 57 mg  1 small side salad (1 cup raw spinach leaves,  cup cucumber,  cup yellow bell pepper) with 1 tsp olive oil and 1 tsp red wine vinegar / 25 mg  Snack /  Sodium (mg)  1 container low-fat vanilla yogurt / 107 mg  3 graham cracker squares / 127 mg  Nutrient Analysis  Calories: 1745  Protein: 75 g  Carbohydrate: 237 g  Fat: 57 g  Sodium: 1425 mg  Document Released: 03/24/2005 Document Revised: 12/04/2010 Document Reviewed: 06/25/2009  [facility] Patient Information 2012 [place], [place].

## 2019-08-16 LAB — COMPREHENSIVE METABOLIC PANEL
ALT: 19 IU/L (ref 0–44)
AST: 19 IU/L (ref 0–40)
Albumin/Globulin Ratio: 2.5 — ABNORMAL HIGH (ref 1.2–2.2)
Albumin: 4.7 g/dL (ref 3.7–4.7)
Alkaline Phosphatase: 95 IU/L (ref 39–117)
BUN/Creatinine Ratio: 15 (ref 10–24)
BUN: 11 mg/dL (ref 8–27)
Bilirubin Total: 0.5 mg/dL (ref 0.0–1.2)
CO2: 18 mmol/L — ABNORMAL LOW (ref 20–29)
Calcium: 9.5 mg/dL (ref 8.6–10.2)
Chloride: 101 mmol/L (ref 96–106)
Creatinine, Ser: 0.73 mg/dL — ABNORMAL LOW (ref 0.76–1.27)
GFR calc Af Amer: 104 mL/min/{1.73_m2} (ref >59)
GFR calc non Af Amer: 90 mL/min/{1.73_m2} (ref >59)
Globulin, Total: 1.9 g/dL (ref 1.5–4.5)
Glucose: 103 mg/dL — ABNORMAL HIGH (ref 65–99)
Potassium: 4.6 mmol/L (ref 3.5–5.2)
Sodium: 139 mmol/L (ref 134–144)
Total Protein: 6.6 g/dL (ref 6.0–8.5)

## 2019-08-16 LAB — LIPID PANEL
Chol/HDL Ratio: 2.3 ratio (ref 0.0–5.0)
Cholesterol, Total: 153 mg/dL (ref 100–199)
HDL: 67 mg/dL (ref >39)
LDL Chol Calc (NIH): 71 mg/dL (ref 0–99)
Triglycerides: 76 mg/dL (ref 0–149)
VLDL Cholesterol Cal: 15 mg/dL (ref 5–40)

## 2019-08-24 ENCOUNTER — Other Ambulatory Visit: Payer: Self-pay

## 2019-08-24 ENCOUNTER — Ambulatory Visit (INDEPENDENT_AMBULATORY_CARE_PROVIDER_SITE_OTHER): Payer: Medicare Other | Admitting: Internal Medicine

## 2019-08-24 VITALS — BP 124/70 | HR 56 | Ht 67.0 in | Wt 204.5 lb

## 2019-08-24 DIAGNOSIS — R0600 Dyspnea, unspecified: Secondary | ICD-10-CM

## 2019-08-24 DIAGNOSIS — I10 Essential (primary) hypertension: Secondary | ICD-10-CM | POA: Diagnosis not present

## 2019-08-24 DIAGNOSIS — I25118 Atherosclerotic heart disease of native coronary artery with other forms of angina pectoris: Secondary | ICD-10-CM | POA: Diagnosis not present

## 2019-08-24 DIAGNOSIS — R011 Cardiac murmur, unspecified: Secondary | ICD-10-CM | POA: Diagnosis not present

## 2019-08-24 DIAGNOSIS — I5032 Chronic diastolic (congestive) heart failure: Secondary | ICD-10-CM

## 2019-08-24 DIAGNOSIS — E785 Hyperlipidemia, unspecified: Secondary | ICD-10-CM

## 2019-08-24 DIAGNOSIS — R0609 Other forms of dyspnea: Secondary | ICD-10-CM

## 2019-08-24 MED ORDER — CARVEDILOL 3.125 MG PO TABS: 3.1250 mg | ORAL_TABLET | Freq: Two times a day (BID) | ORAL | 0 refills | Status: DC

## 2019-08-24 MED ORDER — FUROSEMIDE 20 MG PO TABS
20.0000 mg | ORAL_TABLET | Freq: Every day | ORAL | 2 refills | Status: DC
Start: 2019-08-24 — End: 2019-09-26

## 2019-08-24 NOTE — Patient Instructions (Signed)
Medication Instructions:  Your physician has recommended you make the following change in your medication:  1- START Furosemide 20 mg (1 tablet) by mouth once a day.  *If you need a refill on your cardiac medications before your next appointment, please call your pharmacy*   Lab Work: none If you have labs (blood work) drawn today and your tests are completely normal, you will receive your results only by: Marland Kitchen MyChart Message (if you have MyChart) OR . A paper copy in the mail If you have any lab test that is abnormal or we need to change your treatment, we will call you to review the results.   Testing/Procedures: Your physician has requested that you have an echocardiogram. Echocardiography is a painless test that uses sound waves to create images of your heart. It provides your doctor with information about the size and shape of your heart and how well your heart's chambers and valves are working. This procedure takes approximately one hour. There are no restrictions for this procedure. You may get an IV, if needed, to receive an ultrasound enhancing agent through to better visualize your heart.   Follow-Up: At [facility], you and your health needs are our priority.  As part of our continuing mission to provide you with exceptional heart care, we have created designated Provider Care Teams.  These Care Teams include your primary Cardiologist (physician) and Advanced Practice Providers (APPs -  Physician Assistants and Nurse Practitioners) who all work together to provide you with the care you need, when you need it.  We recommend signing up for the patient portal called "MyChart".  Sign up information is provided on this After Visit Summary.  MyChart is used to connect with patients for Virtual Visits (Telemedicine).  Patients are able to view lab/test results, encounter notes, upcoming appointments, etc.  Non-urgent messages can be sent to your provider as well.   To learn more about  what you can do with MyChart, go to [email or web address].    Your next appointment:   After echo.  The format for your next appointment:   In Person  Provider:    You may see Nelva Bush, MD or one of the following Advanced Practice Providers on your designated Care Team:    Murray Hodgkins, NP  Christell Faith, PA-C  Marrianne Mood, PA-C

## 2019-08-24 NOTE — Progress Notes (Signed)
Follow-up Outpatient Visit Date: 08/24/2019  Primary Care Provider: [name], PA-C [place] [address]  Chief Complaint: Shortness of breath  HPI:  [name] is a 79 y.o. male with history of CAD status post CABG and redo CABG, diabetes, HFpEF, hyperlipidemia, and chronic leg pain in the setting of spinal stenosis, who presents for follow-up of coronary artery disease, HFpEF, and bradycardia.  I last saw him in November for evaluation of bradycardia with readings into the mid 40s at home and at his PCPs office.  I advised him to cut carvedilol in half to 3.125 mg twice daily prior to the visit.  When I saw him, he was feeling well other than left posterior neck pain present for several months after doing yard work.  Due to suboptimal blood pressure control, amlodipine was increased to 5 mg daily.  Today, [name] reports that he has been experiencing progression of his exertional dyspnea since our last visit.  This is usually only noticeable when he walks uphill or climbs several flights of stairs.  He notes that shortness of breath was his anginal equivalent leading up to his 2 prior bypass surgeries.  He has not had any chest pain, palpitations, orthopnea, PND, or lightheadedness.  He notes occasional mild ankle edema.  Home blood pressures typically well controlled with readings in the 120-130/65-75 range.  He has noted an occasional dry cough in the mornings.  --------------------------------------------------------------------------------------------------  Cardiovascular History & Procedures: Cardiovascular Problems:  Coronary artery disease  Ischemic cardiomyopathy  Heart failure with preserved ejection fraction (HFpEF)  Risk Factors:  Known coronary artery disease, hypertension, hyperlipidemia, diabetes mellitus, male gender, obesity, and age > 82  Cath/PCI:  LHC (10/18/12, [place], [place]): LMCA 20-30%  ostial/proximal stenosis. LAD with sequential 100% and 95-99% mid stenoses. LCx with 40-50% mid stenosis. RCA with 100% proximal and 80-100% sequential distal lesions. RPDA and rPL1 have 99% and 95% stenoses, respectively. SVG->LAD and SVG->rPDA are patent. LVEF 67% with normal wall motion.  LHC (04/05/10, [place], [place]): LMCA with 30% mid stenosis. LAD with 100% midvessel occlution. LCx with 40% mid stenosis. RCA with 100% proximal stenosis. LIMA->LAD atretic, SVG->LCx occluded, and SVG-> RCA with 80% ostial and 70% distal stenoses.  CV Surgery:  Redo CABG (04/2010, [place], [place]): SVG->LAD and SVG->rPDA.  CABG (2001, [place]): LIMA->LAD, SVG->LCx, and SVG->rPDA  EP Procedures and Devices:  None  Non-Invasive Evaluation(s):  Pharmacologic MPI (11/06/16): Low risk study without ischemia or scar. LVEF 48% (calculated) but visually appears normal.  TTE (10/21/16): Normal LV size with mild LVH. LVEF 65-70% with normal diastolic function. Mild LA enlargement. Normal RV size and function.  ABIs (03/08/15): Right 1.25, left 1.26; no significant change with exercise.  Exercise myocardial perfusion stress test (10/07/12): Small to moderate in size, mild to moderate in severity, partially reversible defect involving the mid inferior and inferolateral segments. LVEF 65%.  Recent CV Pertinent Labs: Lab Results  Component Value Date   CHOL 153 08/15/2019   HDL 67 08/15/2019   LDLCALC 71 08/15/2019   TRIG 76 08/15/2019   CHOLHDL 2.3 08/15/2019   CHOLHDL 2.7 11/14/2016   K 4.6 08/15/2019   MG 1.9 02/25/2019   BUN 11 08/15/2019   CREATININE 0.73 (L) 08/15/2019    Past medical and surgical history were reviewed and updated in EPIC.  Current Meds  Medication Sig  . amLODipine (NORVASC) 5 MG tablet Take 1 tablet (5 mg total) by mouth  daily.  . aspirin EC 81 MG tablet Take 1 tablet (81 mg total) by mouth daily.  . carvedilol (COREG) 3.125 MG tablet Take 3.125 mg by mouth 2 (two)  times daily with a meal.  . Cholecalciferol (VITAMIN D-3 PO) Take 800 Units by mouth daily.  . Coenzyme Q10 (CO Q-10) 200 MG CAPS Take 200 mg by mouth daily.   . cyanocobalamin 500 MCG tablet Take 500 mcg by mouth daily.  . diclofenac Sodium (VOLTAREN) 1 % GEL Apply 2 g topically 2 (two) times daily as needed.  . ezetimibe (ZETIA) 10 MG tablet Take 1 tablet (10 mg total) by mouth daily. **PATIENT NEEDS APT FOR FURTHER REFILLS**  . finasteride (PROSCAR) 5 MG tablet Take 1 tablet (5 mg total) by mouth daily.  . Glucosamine-Chondroit-Vit C-Mn (GLUCOSAMINE 1500 COMPLEX) CAPS Take 1 capsule by mouth daily.  . Lancets (ONETOUCH ULTRASOFT) lancets Use to check blood sugars fasting daily  . metFORMIN (GLUCOPHAGE) 500 MG tablet 1/2 tablet by mouth daily with food  . Multiple Vitamins-Minerals (PRESERVISION AREDS 2 PO) Take 1 capsule by mouth 2 (two) times a day.  . nitroGLYCERIN (NITROSTAT) 0.4 MG SL tablet Place 1 tablet (0.4 mg total) under the tongue as needed.  . ranolazine (RANEXA) 1000 MG SR tablet TAKE 1  BY MOUTH TWICE DAILY  . rosuvastatin (CRESTOR) 20 MG tablet TAKE 1 TABLET BY MOUTH AT BEDTIME   Current Facility-Administered Medications for the 08/24/19 encounter (Office Visit) with Kadeja Granada, Harrell Gave, MD  Medication  . ipratropium-albuterol (DUONEB) 0.5-2.5 (3) MG/3ML nebulizer solution 3 mL    Allergies: Isosorbide mononitrate [isosorbide dinitrate er] and Tetracyclines & related  Social History   Tobacco Use  . Smoking status: Never Smoker  . Smokeless tobacco: Never Used  Substance Use Topics  . Alcohol use: Yes    Alcohol/week: 11.0 standard drinks    Types: 5 Glasses of wine, 6 Cans of beer per week  . Drug use: No    Family History  Problem Relation Age of Onset  . Diabetes Mother   . Hyperlipidemia Mother   . Cancer Father        colon  . Alcohol abuse Maternal Uncle   . Diabetes Maternal Uncle   . Hyperlipidemia Maternal Uncle   . Cancer Paternal Uncle        colon     Review of Systems: A 12-system review of systems was performed and was negative except as noted in the HPI.  --------------------------------------------------------------------------------------------------  Physical Exam: BP 124/70 (BP Location: Left Arm, Patient Position: Sitting, Cuff Size: Normal)   Pulse (!) 56   Ht 5\' 7"  (1.702 m)   Wt 204 lb 8 oz (92.8 kg)   SpO2 95%   BMI 32.03 kg/m   General: NAD. Neck: No JVD or HJR. Lungs: Clear to auscultation without wheezes or crackles. Heart: Bradycardic but regular with 2/6 systolic murmur.  No rubs or gallops.  Abdomen: Soft, nontender, nondistended. Extremities: 1+ pitting edema to the proximal calves.  2+ radial pulses bilaterally.  EKG: Sinus bradycardia with sinus arrhythmia (heart rate 56 bpm) otherwise, no significant abnormality.  Lab Results  Component Value Date   WBC 19.1 (H) 10/21/2018   HGB 11.8 (L) 10/21/2018   HCT 34.6 (L) 10/21/2018   MCV 91.5 10/21/2018   PLT 202 10/21/2018    Lab Results  Component Value Date   NA 139 08/15/2019   K 4.6 08/15/2019   CL 101 08/15/2019   CO2 18 (L) 08/15/2019  BUN 11 08/15/2019   CREATININE 0.73 (L) 08/15/2019   GLUCOSE 103 (H) 08/15/2019   ALT 19 08/15/2019    Lab Results  Component Value Date   CHOL 153 08/15/2019   HDL 67 08/15/2019   LDLCALC 71 08/15/2019   TRIG 76 08/15/2019   CHOLHDL 2.3 08/15/2019    --------------------------------------------------------------------------------------------------  ASSESSMENT AND PLAN: Coronary artery disease with stable angina and dyspnea on exertion: Mr. Amlin reports progression in exertional dyspnea since our last visit.  This is notable, as shortness of breath has been his anginal equivalent in the past.  He is not had any dyspnea at rest.  He also denies chest pain.  EKG does not show any acute ischemic changes today.  Physical exam is notable for 1+ pretibial edema bilaterally as well as a soft systolic  murmur.  I recommended that we obtain an echocardiogram to exclude developing cardiomyopathy or valvular heart disease as well as at furosemide 20 mg daily.  If echocardiogram is unrevealing and symptoms persist despite initiation of diuresis, we will need to consider cardiac catheterization, given concern for progression of ischemic heart disease and abnormal albeit low risk stress test last year.  No medication changes to current antianginal therapy consisting of carvedilol, amlodipine, and ranolazine.  Chronic HFpEF: Exertional dyspnea has progressed a little since our last visit.  As outlined above, we will add furosemide 20 mg daily and obtain an echocardiogram to exclude developing cardiomyopathy or valvular heart disease underlying his symptoms.  Hypertension: Blood pressure well controlled today.  We will add low-dose furosemide, as above. No other medication changes.  Hyperlipidemia: LDL just above goal on recent labs (71).  We will plan to continue rosuvastatin 20 mg daily and ezetimibe 10 mg daily.  Lifestyle modifications encouraged.  Follow-up: Return to clinic shortly after completion of echocardiogram.  Nelva Bush, MD 08/24/2019 11:03 AM

## 2019-08-25 ENCOUNTER — Encounter: Payer: Self-pay | Admitting: Internal Medicine

## 2019-09-06 ENCOUNTER — Other Ambulatory Visit: Payer: Self-pay | Admitting: Adult Health

## 2019-09-06 ENCOUNTER — Other Ambulatory Visit: Payer: Self-pay | Admitting: Family Medicine

## 2019-09-12 ENCOUNTER — Other Ambulatory Visit: Payer: Self-pay | Admitting: Physician Assistant

## 2019-09-12 MED ORDER — ROSUVASTATIN CALCIUM 20 MG PO TABS: 20.0000 mg | ORAL_TABLET | Freq: Every day | ORAL | 0 refills | Status: DC

## 2019-09-12 NOTE — Telephone Encounter (Signed)
Patient called states he called pharmacy for refill  on Crestor ( advised him refill was probably sent to Dr. Raliegh Scarlet in error & was rejected)  --Forwarding refill request to med asst for :   rosuvastatin (CRESTOR) 20 MG tablet [[id number]]   Order Details Dose, Route, Frequency: As Directed  Dispense Quantity: 90 tablet Refills: 0       Sig: TAKE 1 TABLET BY MOUTH AT BEDTIME       --Pt uses :   Preferred [facility], [place] - [phone] (Phone) [phone] (Fax   --glh

## 2019-09-12 NOTE — Telephone Encounter (Signed)
Refill sent to pharmacy. AS, CMA 

## 2019-09-23 ENCOUNTER — Ambulatory Visit (INDEPENDENT_AMBULATORY_CARE_PROVIDER_SITE_OTHER): Payer: Medicare Other

## 2019-09-23 ENCOUNTER — Other Ambulatory Visit: Payer: Self-pay

## 2019-09-23 DIAGNOSIS — R0600 Dyspnea, unspecified: Secondary | ICD-10-CM

## 2019-09-23 DIAGNOSIS — R011 Cardiac murmur, unspecified: Secondary | ICD-10-CM

## 2019-09-23 DIAGNOSIS — R0609 Other forms of dyspnea: Secondary | ICD-10-CM

## 2019-09-26 ENCOUNTER — Encounter: Payer: Self-pay | Admitting: Family

## 2019-09-26 ENCOUNTER — Other Ambulatory Visit: Payer: Self-pay

## 2019-09-26 ENCOUNTER — Ambulatory Visit: Payer: Medicare Other | Admitting: Family

## 2019-09-26 VITALS — BP 120/64 | HR 67 | Ht 67.0 in | Wt 199.2 lb

## 2019-09-26 DIAGNOSIS — I25118 Atherosclerotic heart disease of native coronary artery with other forms of angina pectoris: Secondary | ICD-10-CM | POA: Diagnosis not present

## 2019-09-26 DIAGNOSIS — E785 Hyperlipidemia, unspecified: Secondary | ICD-10-CM | POA: Diagnosis not present

## 2019-09-26 DIAGNOSIS — R011 Cardiac murmur, unspecified: Secondary | ICD-10-CM

## 2019-09-26 DIAGNOSIS — I5032 Chronic diastolic (congestive) heart failure: Secondary | ICD-10-CM | POA: Diagnosis not present

## 2019-09-26 DIAGNOSIS — I10 Essential (primary) hypertension: Secondary | ICD-10-CM | POA: Diagnosis not present

## 2019-09-26 MED ORDER — FUROSEMIDE 20 MG PO TABS: 20.0000 mg | ORAL_TABLET | Freq: Every day | ORAL | 1 refills | Status: DC

## 2019-09-26 NOTE — Progress Notes (Signed)
Office Visit    Patient Name: Jacob Harrison Date of Encounter: 09/26/2019  Primary Care Provider:  [name], PA-C Primary Cardiologist:  Nelva Bush, MD Electrophysiologist:  None   Chief Complaint    Jacob Harrison is a 79 y.o. male with a hx of CAD s/p CABG and redo CABG, DM2, HFpEF, HLD, chronic leg pain in setting of spinal stenosis, bradycardia presents today for follow up after echocardiogram.   Past Medical History    Past Medical History:  Diagnosis Date  . (HFpEF) heart failure with preserved ejection fraction ([place])    a. 10/2016 Echo: EF 65-70%, mild LVH, mildly dil LA. RV fxn nl.  . Arthritis   . BPH (benign prostatic hyperplasia)   . Coronary artery disease    a. 2001 CABG x 3 [facility]): LIMA->LAD, VG->LCX, VG->RPDA; b. 04/05/2010 Cath ([place]): LM 17m, LAD 12m, LCX 49m, RCA 100p, LIMA->LAD atretic, VG->LCX 100, VG->RCA 80ost, 70d; c. 04/2010 Redo CABG x 2 ([place]): VG->LAD, VG->RPDA; d. 10/2012 Cath [facility]): LM 20-30, LAD 100/95-67m, LCX 40-53m, RCA 100p, 80-100d, RPDA 99, RPL1 95, VG->LAD ok, VG->RPDA ok, EF 67%; e. 11/2016 MV: EF 48%, No ischemia.  . 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 Hyperlipidemia   . Hypertension   . Scoliosis   . Spinal stenosis    Past Surgical History:  Procedure Laterality Date  . CARDIAC CATHETERIZATION    . CORONARY ARTERY BYPASS GRAFT     x 2 (2001 and redo in late 2011 or early 2012)  . HERNIA REPAIR Left    inguinal  . LUMBAR LAMINECTOMY/DECOMPRESSION MICRODISCECTOMY N/A 10/20/2018   Procedure: L3-4, L4-5 LUMBAR DECOMPRESSION with dural repair.;  Surgeon: Marybelle Killings, MD;  Location: [place];  Service: Orthopedics;  Laterality: N/A;  . TONSILLECTOMY AND ADENOIDECTOMY    . TRANSURETHRAL RESECTION OF PROSTATE  1990    Allergies  Allergies  Allergen Reactions  . Isosorbide Mononitrate [Isosorbide Dinitrate Er]     Headaches  . Tetracyclines & Related Rash    History of  Present Illness    Jacob Harrison is a 79 y.o. male with a hx of CAD s/p CABG and redo CABG, DM2, HFpEF, HLD, chronic leg pain in setting of spinal stenosis, bradycardia. He was last seen 08/24/19 by Dr End.  Seen in November 2020 with bradycardia recommended to cut back on Carvedilol. Due to suboptimal BP control, Amlodipine was increased. When seen May 2021 noted progressive exertional dyspnea. A soft systolic murmur was noted and echocardiogram ordered. He was started on Lasix 20mg  daily for pretibial edema.   Echo 09/23/19 with LVEF 60-65%, no RWMA, gr1DD, RV normal size and function, moderate AV sclerosis without stenosis.  Reviewed in depth during clinic visit today.  Tells me his dyspnea on exertion has improved a since starting the diuretic. Walking fast, uphill, or multiple stairs he runs out of breath.  Notices he can walk 12-13 steps in his home without difficulty but when he tries to go up 4 flights of steps he has to stop after 2 flights.  Notes that his backyard is on a slope and if he walks down the yard when he comes back after walking about 100 yards he is breathing heavy.  Overall improved since previous.  No chest pain, pressure, tightness. No orthopnea and PND.   Also notices improvement in his lower extremity edema since addition of Lasix.  He has some trouble with his rotator cuff and  arthritis and has been recommended and nonsteroidal cream by his primary care provider.  He questions whether he could take this due to his blood pressure and history of heart disease.  Encouraged to utilize as directed.   EKGs/Labs/Other Studies Reviewed:   The following studies were reviewed today:  Echo 09/2019   1. Left ventricular ejection fraction, by estimation, is 60 to 65%. The  left ventricle has normal function. The left ventricle has no regional  wall motion abnormalities. Left ventricular diastolic parameters are  consistent with Grade I diastolic  dysfunction (impaired  relaxation).   2. Right ventricular systolic function is normal. The right ventricular  size is normal. Tricuspid regurgitation signal is inadequate for assessing  PA pressure.   3. Aortic valve regurgitation is not visualized. moderate aortic valve  sclerosis/calcification is present, without any evidence of aortic  stenosis.   EKG:  EKG is ordered today.  The ekg ordered today demonstrates NSR 67 bpm with no acute ST/T wave changes.  Recent Labs: 10/21/2018: Hemoglobin 11.8; Platelets 202 02/25/2019: Magnesium 1.9; TSH 1.830 08/15/2019: ALT 19; BUN 11; Creatinine, Ser 0.73; Potassium 4.6; Sodium 139  Recent Lipid Panel    Component Value Date/Time   CHOL 153 08/15/2019 0919   TRIG 76 08/15/2019 0919   HDL 67 08/15/2019 0919   CHOLHDL 2.3 08/15/2019 0919   CHOLHDL 2.7 11/14/2016 0911   VLDL 27 11/14/2016 0911   LDLCALC 71 08/15/2019 0919    Home Medications   Current Meds  Medication Sig  . amLODipine (NORVASC) 5 MG tablet Take 1 tablet (5 mg total) by mouth daily.  Marland Kitchen aspirin EC 81 MG tablet Take 1 tablet (81 mg total) by mouth daily.  . carvedilol (COREG) 3.125 MG tablet Take 1 tablet (3.125 mg total) by mouth 2 (two) times daily with a meal.  . Cholecalciferol (VITAMIN D-3 PO) Take 800 Units by mouth daily.  . Coenzyme Q10 (CO Q-10) 200 MG CAPS Take 200 mg by mouth daily.   . cyanocobalamin 500 MCG tablet Take 500 mcg by mouth daily.  . diclofenac Sodium (VOLTAREN) 1 % GEL Apply 2 g topically 2 (two) times daily as needed.  . ezetimibe (ZETIA) 10 MG tablet Take 1 tablet by mouth once daily  . finasteride (PROSCAR) 5 MG tablet Take 1 tablet (5 mg total) by mouth daily.  . furosemide (LASIX) 20 MG tablet Take 1 tablet (20 mg total) by mouth daily.  . Glucosamine-Chondroit-Vit C-Mn (GLUCOSAMINE 1500 COMPLEX) CAPS Take 1 capsule by mouth daily.  . Lancets (ONETOUCH ULTRASOFT) lancets Use to check blood sugars fasting daily  . metFORMIN (GLUCOPHAGE) 500 MG tablet 1/2 tablet by  mouth daily with food  . Multiple Vitamins-Minerals (PRESERVISION AREDS 2 PO) Take 1 capsule by mouth 2 (two) times a day.  . nitroGLYCERIN (NITROSTAT) 0.4 MG SL tablet Place 1 tablet (0.4 mg total) under the tongue as needed.  . ranolazine (RANEXA) 1000 MG SR tablet TAKE 1  BY MOUTH TWICE DAILY  . rosuvastatin (CRESTOR) 20 MG tablet Take 1 tablet (20 mg total) by mouth at bedtime.  . [DISCONTINUED] furosemide (LASIX) 20 MG tablet Take 1 tablet (20 mg total) by mouth daily.   Current Facility-Administered Medications for the 09/26/19 encounter (Office Visit) with Loel Dubonnet, NP  Medication  . ipratropium-albuterol (DUONEB) 0.5-2.5 (3) MG/3ML nebulizer solution 3 mL      Review of Systems   Review of Systems  Constitutional: Negative for chills, fever and malaise/fatigue.  Cardiovascular: Positive for  dyspnea on exertion (improving) and leg swelling (stable). Negative for chest pain, near-syncope, orthopnea, palpitations and syncope.  Respiratory: Negative for cough, shortness of breath and wheezing.   Gastrointestinal: Negative for nausea and vomiting.  Neurological: Negative for dizziness, light-headedness and weakness.   All other systems reviewed and are otherwise negative except as noted above.  Physical Exam    VS:  BP 120/64 (BP Location: Left Arm, Patient Position: Sitting, Cuff Size: Normal)   Pulse 67   Ht 5\' 7"  (1.702 m)   Wt 199 lb 4 oz (90.4 kg)   SpO2 98%   BMI 31.21 kg/m  , BMI Body mass index is 31.21 kg/m. GEN: Well nourished, well developed, in no acute distress. HEENT: normal. Neck: Supple, no JVD, carotid bruits, or masses. Cardiac: RRR, no rubs, or gallops. No clubbing, cyanosis,. FH2/1 systolic murmur. 1+ pitting edema to bilateral ankes. Radials/DP/PT 2+ and equal bilaterally.  Respiratory:  Respirations regular and unlabored, clear to auscultation bilaterally. GI: Soft, nontender, nondistended, BS + x 4. MS: No deformity or atrophy. Skin: Warm and  dry, no rash. Neuro:  Strength and sensation are intact. Psych: Normal affect.  Assessment & Plan    1. CAD s/p CABG -EKG today without acute ST/T wave changes.  Reports no chest pain, pressure, tightness.  Notes improvement in DOE since addition of Lasix.  GDMT includes aspirin, Coreg, amlodipine, Ranexa, statin, Zetia.  Stress test last year low risk.  No further ischemic evaluation at this time. If he his DOE worsens consider consider cardiac catheterization given concerns for progressive ischemic heart disease.  2. HFpEF -reports improvement in his dyspnea on exertion since addition of Lasix . Down 7lb since office visit last month.  Echo 09/23/2018 LVEF 60 to 97%, grade 1 diastolic dysfunction, RV normal size and function, no significant valvular abnormalities, moderate aortic valve sclerosis without evidence of stenosis.  3. HTN -BP well controlled.  Continue present antihypertensive regimen.  4. Murmur -longstanding history.  Recent echo with aortic valve sclerosis without stenosis.  Likely etiology of murmur.  5. HLD, LDL goal <70 -LDL just above goal on recent labs (LDL 71).  Continue rosuvastatin 5 mg daily and Zetia 10 mg daily.  Encourage lifestyle modifications.  Disposition: Follow up in 2 month(s) with Dr. Benjaman Pott, NP 09/26/2019, 12:10 PM

## 2019-09-26 NOTE — Patient Instructions (Signed)
Medication Instructions:  Your physician recommends that you continue on your current medications as directed. Please refer to the Current Medication list given to you today.  *If you need a refill on your cardiac medications before your next appointment, please call your pharmacy*   Lab Work: Your physician recommends that you have labs drawn today ( a basic metabolic panel). If you have labs (blood work) drawn today and your tests are completely normal, you will receive your results only by: Marland Kitchen MyChart Message (if you have MyChart) OR . A paper copy in the mail If you have any lab test that is abnormal or we need to change your treatment, we will call you to review the results.   Testing/Procedures: None Ordered.   Follow-Up: At [facility], you and your health needs are our priority.  As part of our continuing mission to provide you with exceptional heart care, we have created designated Provider Care Teams.  These Care Teams include your primary Cardiologist (physician) and Advanced Practice Providers (APPs -  Physician Assistants and Nurse Practitioners) who all work together to provide you with the care you need, when you need it.  We recommend signing up for the patient portal called "MyChart".  Sign up information is provided on this After Visit Summary.  MyChart is used to connect with patients for Virtual Visits (Telemedicine).  Patients are able to view lab/test results, encounter notes, upcoming appointments, etc.  Non-urgent messages can be sent to your provider as well.   To learn more about what you can do with MyChart, go to [email or web address].    Your next appointment:   2 month(s)  The format for your next appointment:   In Person  Provider:    You may see Nelva Bush, MD or one of the following Advanced Practice Providers on your designated Care Team:    Murray Hodgkins, NP  Christell Faith, PA-C  Marrianne Mood, PA-C  Laurann Montana,  NP    Other Instructions  N/A

## 2019-09-27 LAB — BASIC METABOLIC PANEL
BUN/Creatinine Ratio: 14 (ref 10–24)
BUN: 10 mg/dL (ref 8–27)
CO2: 20 mmol/L (ref 20–29)
Calcium: 9.6 mg/dL (ref 8.6–10.2)
Chloride: 99 mmol/L (ref 96–106)
Creatinine, Ser: 0.72 mg/dL — ABNORMAL LOW (ref 0.76–1.27)
GFR calc Af Amer: 105 mL/min/{1.73_m2} (ref >59)
GFR calc non Af Amer: 91 mL/min/{1.73_m2} (ref >59)
Glucose: 113 mg/dL — ABNORMAL HIGH (ref 65–99)
Potassium: 4.3 mmol/L (ref 3.5–5.2)
Sodium: 138 mmol/L (ref 134–144)

## 2019-10-19 ENCOUNTER — Other Ambulatory Visit: Payer: Self-pay | Admitting: Internal Medicine

## 2019-10-19 DIAGNOSIS — I25118 Atherosclerotic heart disease of native coronary artery with other forms of angina pectoris: Secondary | ICD-10-CM

## 2019-11-22 ENCOUNTER — Other Ambulatory Visit: Payer: Self-pay

## 2019-11-22 MED ORDER — CARVEDILOL 3.125 MG PO TABS: 3.1250 mg | ORAL_TABLET | Freq: Two times a day (BID) | ORAL | 0 refills | Status: DC

## 2019-11-23 ENCOUNTER — Other Ambulatory Visit: Payer: Self-pay

## 2019-11-23 ENCOUNTER — Ambulatory Visit: Payer: Medicare Other | Admitting: Family

## 2019-11-23 ENCOUNTER — Encounter: Payer: Self-pay | Admitting: Family

## 2019-11-23 VITALS — BP 134/72 | HR 65 | Ht 67.0 in | Wt 198.2 lb

## 2019-11-23 DIAGNOSIS — I5032 Chronic diastolic (congestive) heart failure: Secondary | ICD-10-CM

## 2019-11-23 DIAGNOSIS — R011 Cardiac murmur, unspecified: Secondary | ICD-10-CM

## 2019-11-23 DIAGNOSIS — I25118 Atherosclerotic heart disease of native coronary artery with other forms of angina pectoris: Secondary | ICD-10-CM

## 2019-11-23 DIAGNOSIS — I10 Essential (primary) hypertension: Secondary | ICD-10-CM | POA: Diagnosis not present

## 2019-11-23 DIAGNOSIS — E785 Hyperlipidemia, unspecified: Secondary | ICD-10-CM | POA: Diagnosis not present

## 2019-11-23 NOTE — Patient Instructions (Signed)
Medication Instructions:  No medication changes today.   Over the counter zyrtec (cetirizine) or claritin (loratidine) for your post nasal drainage which is likely the cause of your cough in the morning.   *If you need a refill on your cardiac medications before your next appointment, please call your pharmacy*  Lab Work: No lab work today.   Testing/Procedures: Your EKG today showed normal sinus rhythm which is a great result!  Follow-Up: At [facility], you and your health needs are our priority.  As part of our continuing mission to provide you with exceptional heart care, we have created designated Provider Care Teams.  These Care Teams include your primary Cardiologist (physician) and Advanced Practice Providers (APPs -  Physician Assistants and Nurse Practitioners) who all work together to provide you with the care you need, when you need it.  We recommend signing up for the patient portal called "MyChart".  Sign up information is provided on this After Visit Summary.  MyChart is used to connect with patients for Virtual Visits (Telemedicine).  Patients are able to view lab/test results, encounter notes, upcoming appointments, etc.  Non-urgent messages can be sent to your provider as well.   To learn more about what you can do with MyChart, go to [email or web address].    Your next appointment:   6 month(s)  The format for your next appointment:   In Person  Provider:    You may see Nelva Bush, MD or one of the following Advanced Practice Providers on your designated Care Team:    Murray Hodgkins, NP  Christell Faith, PA-C  Marrianne Mood, PA-C  Laurann Montana, NP

## 2019-11-23 NOTE — Progress Notes (Signed)
Office Visit    Patient Name: Jacob Harrison Date of Encounter: 11/23/2019  Primary Care Provider:  [name], PA-C Primary Cardiologist:  Nelva Bush, MD Electrophysiologist:  None   Chief Complaint    Jacob Harrison is a 79 y.o. male with a hx of CAD s/p CABG and redo CABG, DM2, HFpEF, HLD, chronic leg pain in setting of spinal stenosis, bradycardia presents today for follow up after of CAD and HFpEF.   Past Medical History    Past Medical History:  Diagnosis Date  . (HFpEF) heart failure with preserved ejection fraction ([place])    a. 10/2016 Echo: EF 65-70%, mild LVH, mildly dil LA. RV fxn nl.  . Arthritis   . BPH (benign prostatic hyperplasia)   . Coronary artery disease    a. 2001 CABG x 3 [facility]): LIMA->LAD, VG->LCX, VG->RPDA; b. 04/05/2010 Cath ([place]): LM 48m, LAD 135m, LCX 41m, RCA 100p, LIMA->LAD atretic, VG->LCX 100, VG->RCA 80ost, 70d; c. 04/2010 Redo CABG x 2 ([place]): VG->LAD, VG->RPDA; d. 10/2012 Cath [facility]): LM 20-30, LAD 100/95-91m, LCX 40-46m, RCA 100p, 80-100d, RPDA 99, RPL1 95, VG->LAD ok, VG->RPDA ok, EF 67%; e. 11/2016 MV: EF 48%, No ischemia.  . 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 Hyperlipidemia   . Hypertension   . Scoliosis   . Spinal stenosis    Past Surgical History:  Procedure Laterality Date  . CARDIAC CATHETERIZATION    . CORONARY ARTERY BYPASS GRAFT     x 2 (2001 and redo in late 2011 or early 2012)  . HERNIA REPAIR Left    inguinal  . LUMBAR LAMINECTOMY/DECOMPRESSION MICRODISCECTOMY N/A 10/20/2018   Procedure: L3-4, L4-5 LUMBAR DECOMPRESSION with dural repair.;  Surgeon: Marybelle Killings, MD;  Location: [facility];  Service: Orthopedics;  Laterality: N/A;  . TONSILLECTOMY AND ADENOIDECTOMY    . TRANSURETHRAL RESECTION OF PROSTATE  1990    Allergies  Allergies  Allergen Reactions  . Isosorbide Mononitrate [Isosorbide Dinitrate Er]     Headaches  . Tetracyclines & Related Rash    History of  Present Illness    Jacob Harrison is a 79 y.o. male with a hx of CAD s/p CABG and redo CABG, DM2, HFpEF, HLD, chronic leg pain in setting of spinal stenosis, bradycardia. He was last seen 09/26/19.  Seen in November 2020 with bradycardia recommended to cut back on Carvedilol. Due to suboptimal BP control, Amlodipine was increased. When seen May 2021 noted progressive exertional dyspnea. A soft systolic murmur was noted and echocardiogram ordered. He was started on Lasix 20mg  daily for pretibial edema.   Echo 09/23/19 with LVEF 60-65%, no RWMA, gr1DD, RV normal size and function, moderate AV sclerosis without stenosis.  Seen in follow up 09/26/19. His DOE had improved since addition of Lasix.    Presents today for follow up. He has been enjoying working in his garden. Tells me his dyspnea on exertion is improving. Does not happen "nearly as often nor nearly as quickly". BP home checked 130-140/60-70.   He had an upcoming trip to [place] for reunion.   Does note a nonproductive cough first thing in the morning. This self resolves.   Reports no chest pain, pressure, or tightness. No orthopnea, PND. Reports only very mild ankle edema which is stable at his baseline. Reports no palpitations.    EKGs/Labs/Other Studies Reviewed:   The following studies were reviewed today:  Echo 09/2019   1. Left ventricular ejection fraction, by estimation, is  60 to 65%. The  left ventricle has normal function. The left ventricle has no regional  wall motion abnormalities. Left ventricular diastolic parameters are  consistent with Grade I diastolic  dysfunction (impaired relaxation).   2. Right ventricular systolic function is normal. The right ventricular  size is normal. Tricuspid regurgitation signal is inadequate for assessing  PA pressure.   3. Aortic valve regurgitation is not visualized. moderate aortic valve  sclerosis/calcification is present, without any evidence of aortic  stenosis.    EKG:  EKG is ordered today.  The ekg ordered today demonstrates NSR 65 bpm with no acute ST/T wave changes.  Recent Labs: 02/25/2019: Magnesium 1.9; TSH 1.830 08/15/2019: ALT 19 09/26/2019: BUN 10; Creatinine, Ser 0.72; Potassium 4.3; Sodium 138  Recent Lipid Panel    Component Value Date/Time   CHOL 153 08/15/2019 0919   TRIG 76 08/15/2019 0919   HDL 67 08/15/2019 0919   CHOLHDL 2.3 08/15/2019 0919   CHOLHDL 2.7 11/14/2016 0911   VLDL 27 11/14/2016 0911   LDLCALC 71 08/15/2019 0919    Home Medications   Current Meds  Medication Sig  . amLODipine (NORVASC) 5 MG tablet Take 1 tablet (5 mg total) by mouth daily.  Marland Kitchen aspirin EC 81 MG tablet Take 1 tablet (81 mg total) by mouth daily.  . carvedilol (COREG) 3.125 MG tablet Take 1 tablet (3.125 mg total) by mouth 2 (two) times daily with a meal.  . Cholecalciferol (VITAMIN D-3 PO) Take 800 Units by mouth daily.  . Coenzyme Q10 (CO Q-10) 200 MG CAPS Take 200 mg by mouth daily.   . cyanocobalamin 500 MCG tablet Take 500 mcg by mouth daily.  . diclofenac Sodium (VOLTAREN) 1 % GEL Apply 2 g topically 2 (two) times daily as needed.  . ezetimibe (ZETIA) 10 MG tablet Take 1 tablet by mouth once daily  . finasteride (PROSCAR) 5 MG tablet Take 1 tablet (5 mg total) by mouth daily.  . furosemide (LASIX) 20 MG tablet Take 1 tablet (20 mg total) by mouth daily.  . Glucosamine-Chondroit-Vit C-Mn (GLUCOSAMINE 1500 COMPLEX) CAPS Take 1 capsule by mouth daily.  . Lancets (ONETOUCH ULTRASOFT) lancets Use to check blood sugars fasting daily  . metFORMIN (GLUCOPHAGE) 500 MG tablet 1/2 tablet by mouth daily with food  . Multiple Vitamins-Minerals (PRESERVISION AREDS 2 PO) Take 1 capsule by mouth 2 (two) times a day.  . nitroGLYCERIN (NITROSTAT) 0.4 MG SL tablet Place 1 tablet (0.4 mg total) under the tongue as needed.  . ranolazine (RANEXA) 1000 MG SR tablet Take 1 tablet by mouth twice daily  . rosuvastatin (CRESTOR) 20 MG tablet Take 1 tablet (20 mg  total) by mouth at bedtime.   Current Facility-Administered Medications for the 11/23/19 encounter (Office Visit) with Loel Dubonnet, NP  Medication  . ipratropium-albuterol (DUONEB) 0.5-2.5 (3) MG/3ML nebulizer solution 3 mL    Review of Systems   Review of Systems  Constitutional: Negative for chills, fever and malaise/fatigue.  Cardiovascular: Negative for chest pain, dyspnea on exertion, leg swelling, near-syncope, orthopnea, palpitations and syncope.  Respiratory: Negative for cough, shortness of breath and wheezing.   Gastrointestinal: Negative for nausea and vomiting.  Neurological: Negative for dizziness, light-headedness and weakness.   All other systems reviewed and are otherwise negative except as noted above.  Physical Exam   VS:  BP 134/72 (BP Location: Left Arm, Patient Position: Sitting, Cuff Size: Normal)   Pulse 65   Ht 5\' 7"  (1.702 m)   Wt 198  lb 3.2 oz (89.9 kg)   SpO2 96%   BMI 31.04 kg/m  , BMI Body mass index is 31.04 kg/m. GEN: Well nourished, well developed, in no acute distress. HEENT: normal. Neck: Supple, no JVD, carotid bruits, or masses. Cardiac: RRR, no rubs, or gallops. No clubbing, cyanosis, edema IO9/6 systolic murmur.  Radials/DP/PT 2+ and equal bilaterally.  Respiratory:  Respirations regular and unlabored, clear to auscultation bilaterally. GI: Soft, nontender, nondistended, BS + x 4. MS: No deformity or atrophy. Skin: Warm and dry, no rash. Neuro:  Strength and sensation are intact. Psych: Normal affect.  Assessment & Plan    1. CAD s/p CABG - EKG today without acute ST/T wave changes.  Reports no chest pain, pressure, tightness.  GDMT includes aspirin, Coreg, amlodipine, Ranexa, statin, Zetia.  Stress test last year low risk.  No further ischemic evaluation at this time. Reports marked improvement in DOE since last seen, encouraged to continue his exercise program.  2. HFpEF -  Echo 09/23/2018 LVEF 60 to 29%, grade 1 diastolic  dysfunction, RV normal size and function, no significant valvular abnormalities, moderate aortic valve sclerosis without evidence of stenosis. Euvolemic and well compensated on exam. Continue Lasix 20mg  daily.   3. HTN -BP well controlled.  Continue present antihypertensive regimen.  4. Murmur - Longstanding history.  Recent echo with aortic valve sclerosis without stenosis.  Likely etiology of murmur.  5. HLD, LDL goal <70 -LDL just above goal on recent labs (LDL 71).  Continue rosuvastatin 5 mg daily and Zetia 10 mg daily.  Encourage lifestyle modifications.  Disposition: Follow up in 6 month(s) with Dr. Benjaman Pott, NP 11/23/2019, 12:01 PM

## 2019-12-05 ENCOUNTER — Other Ambulatory Visit: Payer: Self-pay | Admitting: Physician Assistant

## 2019-12-06 ENCOUNTER — Telehealth: Payer: Self-pay | Admitting: Physical Medicine and Rehabilitation

## 2019-12-06 NOTE — Telephone Encounter (Signed)
It looks like he sees Dr. Lorin Mercy for this, so he will need an appointment with him first.

## 2019-12-06 NOTE — Telephone Encounter (Signed)
Patient called.   He is requesting an appointment for his left shoulder and arm to be seen.   Call back: [phone]

## 2019-12-07 NOTE — Telephone Encounter (Signed)
Ok ov

## 2019-12-07 NOTE — Telephone Encounter (Signed)
Pt was sch for 12/27/19

## 2019-12-07 NOTE — Telephone Encounter (Signed)
Please advise. Patient requests appointment for arm and shoulder. He has only seen you for low back.

## 2019-12-08 ENCOUNTER — Telehealth: Payer: Self-pay | Admitting: Physician Assistant

## 2019-12-08 MED ORDER — ROSUVASTATIN CALCIUM 20 MG PO TABS: 20.0000 mg | ORAL_TABLET | Freq: Every day | ORAL | 0 refills | Status: DC

## 2019-12-08 NOTE — Telephone Encounter (Signed)
Refill sent to requested pharmacy, AS, CMA

## 2019-12-08 NOTE — Telephone Encounter (Signed)
Patient is requesting a refill of his Crestor, if approved please send to [facility] in [place] on [address]

## 2019-12-08 NOTE — Addendum Note (Signed)
Addended by: Mickel Crow on: 12/08/2019 10:15 AM   Modules accepted: Orders

## 2019-12-15 ENCOUNTER — Ambulatory Visit (INDEPENDENT_AMBULATORY_CARE_PROVIDER_SITE_OTHER): Payer: Medicare Other | Admitting: Physician Assistant

## 2019-12-15 ENCOUNTER — Other Ambulatory Visit: Payer: Self-pay

## 2019-12-15 ENCOUNTER — Encounter: Payer: Self-pay | Admitting: Physician Assistant

## 2019-12-15 VITALS — BP 135/65 | HR 55 | Temp 97.9°F | Ht 67.0 in | Wt 200.0 lb

## 2019-12-15 DIAGNOSIS — E1169 Type 2 diabetes mellitus with other specified complication: Secondary | ICD-10-CM | POA: Diagnosis not present

## 2019-12-15 DIAGNOSIS — I5032 Chronic diastolic (congestive) heart failure: Secondary | ICD-10-CM | POA: Diagnosis not present

## 2019-12-15 DIAGNOSIS — E785 Hyperlipidemia, unspecified: Secondary | ICD-10-CM

## 2019-12-15 DIAGNOSIS — E118 Type 2 diabetes mellitus with unspecified complications: Secondary | ICD-10-CM | POA: Diagnosis not present

## 2019-12-15 DIAGNOSIS — I10 Essential (primary) hypertension: Secondary | ICD-10-CM | POA: Diagnosis not present

## 2019-12-15 NOTE — Progress Notes (Signed)
Telehealth office visit note for Jacob Reid, PA-C- at Primary Care at [facility]   I connected with current patient today by telephone and verified that I am speaking with the correct person   . Location of the patient: Home . Location of the provider: Office - This visit type was conducted due to national recommendations for restrictions regarding the COVID-19 Pandemic (e.g. social distancing) in an effort to limit this patient's exposure and mitigate transmission in our community.    - No physical exam could be performed with this format, beyond that communicated to [place] by the patient/ family members as noted.   - Additionally my office staff/ schedulers were to discuss with the patient that there may be a monetary charge related to this service, depending on their medical insurance.  My understanding is that patient understood and consented to proceed.     _________________________________________________________________________________   History of Present Illness: Pt calls in to follow-up on diabetes mellitus, hypertension, and hyperlipidemia. Reports he is seeing Dr. Lorin Mercy for rotator cuff issue with left shoulder.  Diabetes: Pt denies increased urination or thirst. Pt reports medication compliance. No hypoglycemic events. Checking glucose at home. FBS range 110-130.  HTN: Pt denies chest pain, palpitations, dizziness or lower extremity swelling. Taking medication as directed without side effects. Checks BP at home and readings usually <140/90. Pt follows a low salt diet.  HLD: Pt taking medication as directed without issues.   Dyspnea on exertion: States his shortness of breath is improving. He was started on diuretic by cardiology which helps his symptoms.      No flowsheet data found.  Depression screen [facility] 2/9 12/15/2019 08/15/2019 05/17/2019 02/22/2019 08/17/2018  Decreased Interest 0 0 0 0 0  Down, Depressed, Hopeless 0 0 0 0 0  PHQ - 2 Score 0 0 0 0 0  Altered  sleeping 2 1 2 2 2   Tired, decreased energy 0 1 1 1 2   Change in appetite 0 0 0 0 0  Feeling bad or failure about yourself  0 0 0 0 0  Trouble concentrating 0 0 0 0 0  Moving slowly or fidgety/restless 0 1 0 0 0  Suicidal thoughts 0 0 0 0 0  PHQ-9 Score 2 3 3 3 4   Difficult doing work/chores Not difficult at all Not difficult at all Not difficult at all Somewhat difficult Somewhat difficult  Some recent data might be hidden      Impression and Recommendations:     1. Diabetes mellitus with complication ([place])   2. Hyperlipidemia associated with type 2 diabetes mellitus ([place])   3. Essential hypertension   4. Chronic heart failure with preserved ejection fraction (HCC)     Diabetes Mellitus with complication: -Last B3A stable -Continue current medication regimen. -Follow low carbohydrate and glucose diet. -Plan to recheck A1c with [facility]  HTN: - BP stable - Continue current medication regimen. - Continue ambulatory BP and pulse monitoring. - Follow DASH diet. - Encourage to stay as active as possible.  Hyperlipidemia associated with Type 2 DM: -Last lipid panel wnl, LDL 71 -Continue current medication regimen. -Follow a heart healthy diet low in saturated and trans fats. -Stay as active as possible. -Plan to recheck lipid panel and hepatic function with [facility].  Chronic heart failure with preserved EF: -Followed Cardiology. -DOE improving, continue with Lasix.    - As part of my medical decision making, I reviewed the following data within the [place] History  obtained from pt /family, CMA notes reviewed and incorporated if applicable, Labs reviewed, Radiograph/ tests reviewed if applicable and OV notes from prior OV's with me, as well as any other specialists she/he has seen since seeing me last, were all reviewed and used in my medical decision making process today.    - Additionally, when appropriate, discussion had with patient regarding our treatment  plan, and their biases/concerns about that plan were used in my medical decision making today.    - The patient agreed with the plan and demonstrated an understanding of the instructions.   No barriers to understanding were identified.     - The patient was advised to call back or seek an in-person evaluation if the symptoms worsen or if the condition fails to improve as anticipated.   Return in about 4 months (around 04/15/2020) for [facility] and [facility] few days prior.    No orders of the defined types were placed in this encounter.   No orders of the defined types were placed in this encounter.   There are no discontinued medications.     Time spent on visit including pre-visit chart review and post-visit care was 12 minutes.      The [place] was signed into law in 2016 which includes the topic of electronic health records.  This provides immediate access to information in MyChart.  This includes consultation notes, operative notes, office notes, lab results and pathology reports.  If you have any questions about what you read please let us know at your next visit or call us at the office.  We are right here with you.  Note:  This note was prepared with assistance of Dragon voice recognition software. Occasional wrong-word or sound-a-like substitutions may have occurred due to the inherent limitations of voice recognition software.  __________________________________________________________________________________     Patient Care Team    Relationship Specialty Notifications Start End  Jacob Harrison, [place] PCP - General Physician Assistant  08/15/19   End, Harrell Gave, MD PCP - Cardiology Cardiology Admissions 10/28/17      -Vitals obtained; medications/ allergies reconciled;  personal medical, social, Sx etc.histories were updated by CMA, reviewed by me and are reflected in chart   Patient Active Problem List   Diagnosis Date Noted  . Rotator cuff disorder  08/09/2019  . Sinusitis 04/27/2019  . BPH associated with nocturia 11/29/2018  . 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 Encounter for Medicare annual wellness exam 08/17/2018  . Spinal stenosis of lumbar region 08/02/2018  . Flu-like symptoms 05/05/2018  . Bacterial conjunctivitis of both eyes 04/27/2018  . Atypical mole 07/30/2017  . Healthcare maintenance 07/30/2017  . Wheezing 07/21/2017  . Acute bronchitis 07/21/2017  . Neck pain 06/08/2017  . Sinus bradycardia 03/18/2017  . Screening for colon cancer 02/25/2017  . Ventral hernia without obstruction or gangrene 02/25/2017  . Chronic heart failure with preserved ejection fraction ([place]) 09/28/2016  . Ischemic cardiomyopathy 09/28/2016  . Cough in adult 07/22/2016  . Vitamin D deficiency 06/27/2016  . Vitamin B deficiency 06/27/2016  . Hyperlipidemia LDL goal <70 06/21/2016  . Weakness of both lower extremities 06/21/2016  . Diabetes mellitus without complication ([place]) 14/43/1540  . Essential hypertension 05/20/2016  . Hyperlipidemia associated with type 2 diabetes mellitus ([place]) 05/20/2016  . Coronary artery disease of native artery of native heart with stable angina pectoris ([place]) 05/20/2016     Current Meds  Medication Sig  . amLODipine (NORVASC)  5 MG tablet Take 1 tablet (5 mg total) by mouth daily.  Marland Kitchen aspirin EC 81 MG tablet Take 1 tablet (81 mg total) by mouth daily.  . carvedilol (COREG) 3.125 MG tablet Take 1 tablet (3.125 mg total) by mouth 2 (two) times daily with a meal.  . Cholecalciferol (VITAMIN D-3 PO) Take 800 Units by mouth daily.  . Coenzyme Q10 (CO Q-10) 200 MG CAPS Take 200 mg by mouth daily.   . cyanocobalamin 500 MCG tablet Take 500 mcg by mouth daily.  . diclofenac Sodium (VOLTAREN) 1 % GEL Apply 2 g topically 2 (two) times daily as needed.  . ezetimibe (ZETIA) 10 MG tablet Take 1 tablet by mouth once daily  . finasteride (PROSCAR) 5 MG tablet Take 1 tablet (5 mg  total) by mouth daily.  . furosemide (LASIX) 20 MG tablet Take 1 tablet (20 mg total) by mouth daily.  . Glucosamine-Chondroit-Vit C-Mn (GLUCOSAMINE 1500 COMPLEX) CAPS Take 1 capsule by mouth daily.  . metFORMIN (GLUCOPHAGE) 500 MG tablet 1/2 tablet by mouth daily with food  . Multiple Vitamins-Minerals (PRESERVISION AREDS 2 PO) Take 1 capsule by mouth 2 (two) times a day.  . nitroGLYCERIN (NITROSTAT) 0.4 MG SL tablet Place 1 tablet (0.4 mg total) under the tongue as needed.  . ranolazine (RANEXA) 1000 MG SR tablet Take 1 tablet by mouth twice daily  . rosuvastatin (CRESTOR) 20 MG tablet Take 1 tablet (20 mg total) by mouth at bedtime.   Current Facility-Administered Medications for the 12/15/19 encounter (Office Visit) with Jacob Reid, PA-C  Medication  . ipratropium-albuterol (DUONEB) 0.5-2.5 (3) MG/3ML nebulizer solution 3 mL     Allergies:  Allergies  Allergen Reactions  . Isosorbide Mononitrate [Isosorbide Dinitrate Er]     Headaches  . Tetracyclines & Related Rash     ROS:  See above HPI for pertinent positives and negatives   Objective:   Blood pressure 135/65, pulse (!) 55, temperature 97.9 F (36.6 C), temperature source Oral, height 5\' 7"  (1.702 m), weight 200 lb (90.7 kg).  (if some vitals are omitted, this means that patient was UNABLE to obtain them even though they were asked to get them prior to OV today.  They were asked to call us at their earliest convenience with these once obtained. ) General: A & O * 3; sounds in no acute distress; in usual state of health.  Respiratory: speaking in full sentences, no conversational dyspnea; Psych: insight appears good, mood- appears full

## 2019-12-27 ENCOUNTER — Encounter: Payer: Self-pay | Admitting: Physical Medicine and Rehabilitation

## 2019-12-27 ENCOUNTER — Other Ambulatory Visit: Payer: Self-pay

## 2019-12-27 ENCOUNTER — Ambulatory Visit: Payer: Self-pay

## 2019-12-27 ENCOUNTER — Ambulatory Visit: Payer: Medicare Other | Admitting: Physical Medicine and Rehabilitation

## 2019-12-27 VITALS — BP 135/80 | HR 66

## 2019-12-27 DIAGNOSIS — M25512 Pain in left shoulder: Secondary | ICD-10-CM

## 2019-12-27 DIAGNOSIS — G8929 Other chronic pain: Secondary | ICD-10-CM | POA: Diagnosis not present

## 2019-12-27 DIAGNOSIS — M5010 Cervical disc disorder with radiculopathy, unspecified cervical region: Secondary | ICD-10-CM | POA: Diagnosis not present

## 2019-12-27 DIAGNOSIS — M7502 Adhesive capsulitis of left shoulder: Secondary | ICD-10-CM

## 2019-12-27 DIAGNOSIS — M542 Cervicalgia: Secondary | ICD-10-CM

## 2019-12-27 NOTE — Progress Notes (Signed)
Left neck, shoulder, and arm pain. Pain goes almost to elbow. Pain started in neck about a year ago. Saw Dr. Lorin Mercy in May.  Numeric Pain Rating Scale and Functional Assessment Average Pain 8 Pain Right Now 2 My pain is constant and aching Pain is worse with: some activites Pain improves with: rest   In the last MONTH (on 0-10 scale) has pain interfered with the following?  1. General activity like being  able to carry out your everyday physical activities such as walking, climbing stairs, carrying groceries, or moving a chair?  Rating(5)  2. Relation with others like being able to carry out your usual social activities and roles such as  activities at home, at work and in your community. Rating(5)  3. Enjoyment of life such that you have  been bothered by emotional problems such as feeling anxious, depressed or irritable?  Rating(2)

## 2019-12-27 NOTE — Progress Notes (Signed)
Jacob Harrison - 79 y.o. male MRN [id number]  Date of birth: 1940/10/28  Office Visit Note: Visit Date: 12/27/2019 PCP: [name], PA-C Referred by: [name], PA-C  Subjective: Chief Complaint  Patient presents with  . Neck - Pain  . Left Shoulder - Pain   HPI: Jacob Harrison is a 79 y.o. male who comes in today For evaluation management of chronic worsening left shoulder pain with some referral into the neck shoulder blade and upper arm.  He is a patient and followed by Dr. Rodell Perna in the office.  We have seen the patient in the past for lumbar spine issues and injection.  Those injections unfortunately did not help very long.  He subsequently underwent lumbar laminectomies at L3-4 and L4-5.  Per Dr. Lorin Mercy' last note said he thought this was more of a rotator cuff pathology of the shoulder and the patient tells me that he suggested possibly getting an intra-articular injection performed but none were ordered.  The patient ultimately called [place] with neck pain and shoulder pain because he had seen [place] in the past.  Dr. Lorin Mercy did obtain cervical spine x-ray.  This is reviewed with the patient and reviewed below.  Patient has no prior history of shoulder surgery or neck surgery.  He describes average severe pain of 8 out of 10 particularly with movement of the left shoulder with abduction and forward flexion.  He has limited range of motion of the left shoulder.  He is a diabetic.  He reports the pain is constant and aching but obviously worse with movement.  It does refer up into the lower part of the trapezius and down as far as the elbow.  No paresthesias in the hands.  He has not had MRI of the cervical spine or MRI of the shoulder.  He has not had recent physical therapy of the shoulder.  Review of Systems  Musculoskeletal: Positive for joint pain and neck pain.  All other systems reviewed and are negative.  Otherwise per HPI.  Assessment & Plan: Visit  Diagnoses:  1. Chronic left shoulder pain   2. Cervicalgia   3. Adhesive capsulitis of left shoulder   4. Cervical disc disorder with radiculopathy     Plan: Findings:  Chronic worsening severe predominantly left shoulder pain worse with movement with limited range of motion.  Patient with known rotator cuff pathology per Dr. Inda Merlin and what seems like to be adhesive capsulitis.  He may have some pain emanating from the cervical spine and likely facet arthropathy but this does not seem to be radicular in nature.  At this point we did complete diagnostic and hopefully therapeutic intra-articular glenohumeral joint injection fluoroscopic guidance.  As noted in the procedure note the patient had pretty significant relief of symptoms during the anesthetic phase and had increased range of motion.  We are going to refer him to physical therapy for evaluation management and range of motion.  We did teach him some shoulder exercises to perform today.  Depending on relief and therapy will follow up with Dr. Leana Gamer for his shoulder.  If he were to get any radicular type symptoms further down the arm or anything that was left over at the neck probably would look at cervical spine MRI for him because of history of stenosis of the lumbar spine.    Meds & Orders: No orders of the defined types were placed in this encounter.   Orders Placed This Encounter  Procedures  .  Large Joint Inj: L glenohumeral  . XR C-ARM NO REPORT  . Ambulatory referral to Physical Therapy    Follow-up: Return if symptoms worsen or fail to improve.   Procedures: Large Joint Inj: L glenohumeral on 12/27/2019 10:35 AM Indications: pain and diagnostic evaluation Details: 22 G 3.5 in needle, fluoroscopy-guided anteromedial approach  Arthrogram: No  Medications: 3 mL bupivacaine 0.5 %; 60 mg triamcinolone acetonide 40 MG/ML Outcome: tolerated well, no immediate complications  There was excellent flow of contrast producing a  partial arthrogram of the glenohumeral joint. The patient did have relief of symptoms during the anesthetic phase of the injection. Procedure, treatment alternatives, risks and benefits explained, specific risks discussed. Consent was given by the patient. Immediately prior to procedure a time out was called to verify the correct patient, procedure, equipment, support staff and site/side marked as required. Patient was prepped and draped in the usual sterile fashion.      No notes on file   Clinical History: 08/09/2019 cervical spine 2 view x-ray  AP lateral cervical spine x-rays are obtained and reviewed. This shows  multilevel cervical spondylosis with endplate spurring some calcification  anteriorly multiple levels C3-C7 and facet arthropathy multiple levels.   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 Dr. Rodell Perna   He reports that he has never smoked. He has never used smokeless tobacco.  Recent Labs    02/22/19 1044 05/17/19 0859 08/15/19 0917  HGBA1C 5.8* 5.8* 5.5    Objective:  VS:  HT:    WT:   BMI:     BP:135/80  HR:66bpm  TEMP: ( )  RESP:  Physical Exam Vitals and nursing note reviewed.  Constitutional:      General: He is not in acute distress.    Appearance: Normal appearance. He is well-developed.  HENT:     Head: Normocephalic and atraumatic.  Eyes:     Conjunctiva/sclera: Conjunctivae normal.     Pupils: Pupils are equal, round, and reactive to light.  Neck:     Comments: Some pain at end ranges of rotation right and left and some more pain with forward flexion than extension. Cardiovascular:     Rate and Rhythm: Normal rate.     Pulses: Normal pulses.     Heart sounds: Normal heart sounds.  Pulmonary:     Effort: Pulmonary effort is normal. No respiratory distress.  Musculoskeletal:     Cervical back: Neck supple. No rigidity or tenderness.     Right lower leg: No edema.     Left lower leg: No edema.     Comments: Patient does sit  with forward flexed cervical spine.  He does have active trigger points in the rhomboids and supraspinatus and levator scapula on the left more than right.  He has significantly decreased range of motion actively on the left Duda pain.  He has pain with internal and external rotation is very limited motion.  He appears to have good strength in all muscle groups of the upper extremities bilaterally without deficit.  Somewhat hard to test with the shoulder do to pain complaints.  Skin:    General: Skin is warm and dry.     Findings: No erythema or rash.  Neurological:     General: No focal deficit present.     Mental Status: He is alert and oriented to person, place, and time.     Sensory: No sensory deficit.     Coordination: Coordination normal.     Gait: Gait normal.  Psychiatric:        Mood and Affect: Mood normal.        Behavior: Behavior normal.     Ortho Exam  Imaging: XR C-ARM NO REPORT  Result Date: 12/27/2019 Please see Notes tab for imaging impression.   Past Medical/Family/Surgical/Social History: Medications & Allergies reviewed per EMR, new medications updated. Patient Active Problem List   Diagnosis Date Noted  . Rotator cuff disorder 08/09/2019  . Sinusitis 04/27/2019  . BPH associated with nocturia 11/29/2018  . 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 Encounter for Medicare annual wellness exam 08/17/2018  . Spinal stenosis of lumbar region 08/02/2018  . Flu-like symptoms 05/05/2018  . Bacterial conjunctivitis of both eyes 04/27/2018  . Atypical mole 07/30/2017  . Healthcare maintenance 07/30/2017  . Wheezing 07/21/2017  . Acute bronchitis 07/21/2017  . Neck pain 06/08/2017  . Sinus bradycardia 03/18/2017  . Screening for colon cancer 02/25/2017  . Ventral hernia without obstruction or gangrene 02/25/2017  . Chronic heart failure with preserved ejection fraction ([place]) 09/28/2016  . Ischemic cardiomyopathy 09/28/2016    . Cough in adult 07/22/2016  . Vitamin D deficiency 06/27/2016  . Vitamin B deficiency 06/27/2016  . Hyperlipidemia LDL goal <70 06/21/2016  . Weakness of both lower extremities 06/21/2016  . Diabetes mellitus without complication ([place]) 10/05/1599  . Essential hypertension 05/20/2016  . Hyperlipidemia associated with type 2 diabetes mellitus ([place]) 05/20/2016  . Coronary artery disease of native artery of native heart with stable angina pectoris ([place]) 05/20/2016   Past Medical History:  Diagnosis Date  . (HFpEF) heart failure with preserved ejection fraction ([place])    a. 10/2016 Echo: EF 65-70%, mild LVH, mildly dil LA. RV fxn nl.  . Arthritis   . BPH (benign prostatic hyperplasia)   . Coronary artery disease    a. 2001 CABG x 3 [facility]): LIMA->LAD, VG->LCX, VG->RPDA; b. 04/05/2010 Cath ([place]): LM 80m, LAD 173m, LCX 12m, RCA 100p, LIMA->LAD atretic, VG->LCX 100, VG->RCA 80ost, 70d; c. 04/2010 Redo CABG x 2 ([place]): VG->LAD, VG->RPDA; d. 10/2012 Cath [facility]): LM 20-30, LAD 100/95-92m, LCX 40-32m, RCA 100p, 80-100d, RPDA 99, RPL1 95, VG->LAD ok, VG->RPDA ok, EF 67%; e. 11/2016 MV: EF 48%, No ischemia.  . 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 Hyperlipidemia   . Hypertension   . Scoliosis   . Spinal stenosis    Family History  Problem Relation Age of Onset  . Diabetes Mother   . Hyperlipidemia Mother   . Cancer Father        colon  . Alcohol abuse Maternal Uncle   . Diabetes Maternal Uncle   . Hyperlipidemia Maternal Uncle   . Cancer Paternal Uncle        colon   Past Surgical History:  Procedure Laterality Date  . CARDIAC CATHETERIZATION    . CORONARY ARTERY BYPASS GRAFT     x 2 (2001 and redo in late 2011 or early 2012)  . HERNIA REPAIR Left    inguinal  . LUMBAR LAMINECTOMY/DECOMPRESSION MICRODISCECTOMY N/A 10/20/2018   Procedure: L3-4, L4-5 LUMBAR DECOMPRESSION with dural repair.;  Surgeon: Marybelle Killings, MD;  Location: [facility];  Service: Orthopedics;   Laterality: N/A;  . TONSILLECTOMY AND ADENOIDECTOMY    . TRANSURETHRAL RESECTION OF PROSTATE  1990   Social History   Occupational History  . Not on file  Tobacco Use  . Smoking status: Never Smoker  . Smokeless tobacco: Never  Used  Vaping Use  . Vaping Use: Never used  Substance and Sexual Activity  . Alcohol use: Yes    Alcohol/week: 11.0 standard drinks    Types: 5 Glasses of wine, 6 Cans of beer per week  . Drug use: No  . Sexual activity: Not Currently    Birth control/protection: None

## 2019-12-28 ENCOUNTER — Encounter: Payer: Self-pay | Admitting: Physical Therapy

## 2019-12-28 ENCOUNTER — Encounter: Payer: Self-pay | Admitting: Physical Medicine and Rehabilitation

## 2019-12-28 ENCOUNTER — Ambulatory Visit: Payer: Medicare Other | Admitting: Physical Therapy

## 2019-12-28 DIAGNOSIS — M25612 Stiffness of left shoulder, not elsewhere classified: Secondary | ICD-10-CM | POA: Diagnosis not present

## 2019-12-28 DIAGNOSIS — M6281 Muscle weakness (generalized): Secondary | ICD-10-CM | POA: Diagnosis not present

## 2019-12-28 DIAGNOSIS — M542 Cervicalgia: Secondary | ICD-10-CM

## 2019-12-28 DIAGNOSIS — R600 Localized edema: Secondary | ICD-10-CM

## 2019-12-28 DIAGNOSIS — G8929 Other chronic pain: Secondary | ICD-10-CM

## 2019-12-28 DIAGNOSIS — M25512 Pain in left shoulder: Secondary | ICD-10-CM

## 2019-12-28 MED ORDER — BUPIVACAINE HCL 0.5 % IJ SOLN
3.0000 mL | INTRAMUSCULAR | Status: AC | PRN
  Administered 2019-12-27: 3 mL via INTRA_ARTICULAR

## 2019-12-28 MED ORDER — TRIAMCINOLONE ACETONIDE 40 MG/ML IJ SUSP
60.0000 mg | INTRAMUSCULAR | Status: AC | PRN
  Administered 2019-12-27: 60 mg via INTRA_ARTICULAR

## 2019-12-28 NOTE — Patient Instructions (Signed)
Access Code: [email or web address] URL: [email or web address] Date: 12/28/2019 Prepared by: Elsie Ra  Exercises Supine Shoulder Flexion Extension AAROM with Dowel - 2 x daily - 6 x weekly - 2-3 sets - 10 reps Supine Shoulder Abduction AAROM with Dowel - 2 x daily - 6 x weekly - 2-3 sets - 10 reps Standing Shoulder Flexion Wall Walk - 2 x daily - 6 x weekly - 1 sets - 10-15 reps - 5 sec hold Seated Shoulder Abduction Towel Slide at Table Top - 2-3 x daily - 6 x weekly - 10 reps - 1-2 sets - 5 hold Standing Isometric Shoulder External Rotation with Doorway - 2 x daily - 6 x weekly - 10 reps - 1-2 sets - 5 hold Standing Isometric Shoulder Abduction with Doorway - Arm Bent - 2 x daily - 6 x weekly - 1-2 sets - 10 reps - 5 hold

## 2019-12-28 NOTE — Therapy (Signed)
[facility] [address] [place], [place], [address] Phone: [phone]   Fax:  [phone]  Physical Therapy Evaluation  Patient Details  Name: Ariyon Mittleman MRN: [id number] Date of Birth: [birth year] Referring Provider (PT): Magnus Sinning, MD   Encounter Date: 12/28/2019   PT End of Session - 12/28/19 1235    Visit Number 1    Number of Visits 16    Date for PT Re-Evaluation 02/22/20    Authorization Type UHC MCR    Progress Note Due on Visit 10    PT Start Time 1140    PT Stop Time 1226    PT Time Calculation (min) 46 min    Activity Tolerance Patient tolerated treatment well    Behavior During Therapy [facility] for tasks assessed/performed           Past Medical History:  Diagnosis Date  . (HFpEF) heart failure with preserved ejection fraction ([place])    a. 10/2016 Echo: EF 65-70%, mild LVH, mildly dil LA. RV fxn nl.  . Arthritis   . BPH (benign prostatic hyperplasia)   . Coronary artery disease    a. 2001 CABG x 3 [facility]): LIMA->LAD, VG->LCX, VG->RPDA; b. 04/05/2010 Cath ([place]): LM 57m, LAD 135m, LCX 47m, RCA 100p, LIMA->LAD atretic, VG->LCX 100, VG->RCA 80ost, 70d; c. 04/2010 Redo CABG x 2 ([place]): VG->LAD, VG->RPDA; d. 10/2012 Cath [facility]arlene [facility]): LM 20-30, LAD 100/95-38m, LCX 40-50m, RCA 100p, 80-100d, RPDA 99, RPL1 95, VG->LAD ok, VG->RPDA ok, EF 67%; e. 11/2016 MV: EF 48%, No ischemia.  . 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 Hyperlipidemia   . Hypertension   . Scoliosis   . Spinal stenosis     Past Surgical History:  Procedure Laterality Date  . CARDIAC CATHETERIZATION    . CORONARY ARTERY BYPASS GRAFT     x 2 (2001 and redo in late 2011 or early 2012)  . HERNIA REPAIR Left    inguinal  . LUMBAR LAMINECTOMY/DECOMPRESSION MICRODISCECTOMY N/A 10/20/2018   Procedure: L3-4, L4-5 LUMBAR DECOMPRESSION with dural repair.;  Surgeon: Marybelle Killings, MD;  Location: [facility];  Service: Orthopedics;  Laterality:  N/A;  . TONSILLECTOMY AND ADENOIDECTOMY    . TRANSURETHRAL RESECTION OF PROSTATE  1990    There were no vitals filed for this visit.    Subjective Assessment - 12/28/19 1141    Subjective chronic worsening left shoulder pain and  neck pain with referral intoshoulder blade and upper arm over the last year. underwent lumbar laminectomies at L3-4 and L4-5. Patient has no prior history of shoulder surgery or neck surgery.It does refer up into the lower part of the trapezius and down as far as the elbow.  No paresthesias in the hands. He had Lt shoulder injection 12/27/19 and says this has helped some.    Pertinent History PMH: DM, heart failure,CAD,HTN,Scoliosis, spinal stenosis and spondylolysis, lumbar decompression surgery 2020.    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 Yes    Pain Score 8    8/10 with movement, 2-3 at rest   Pain Location Shoulder    Pain Orientation Left    Pain Descriptors / Indicators Aching;Tightness   relays it feels like muscle pain more than jt pain  Pain Type Chronic pain    Pain Radiating Towards down right arm to elbow, denies N/T    Pain Onset More than a month ago    Pain Frequency Intermittent    Aggravating Factors  reaching arm out to side is worse and up in front, showering and Upper body dressing, driving.    Pain Relieving Factors NSAIDs but cant take much of this due to heart condition    Multiple Pain Sites Yes   some pain into his neck, this is improving while shoulder is worsening             [facility] PT Assessment - 12/28/19 0001      Assessment   Medical Diagnosis Chronic Lt shoulder pain with adhesive capsulitis, Cervicalgia    Referring Provider (PT) Magnus Sinning, MD    Onset  Date/Surgical Date --   one year onset of pain   Hand Dominance Right    Next MD Visit PRN    Prior Therapy none      Precautions   Precautions None      Restrictions   Weight Bearing Restrictions No      Home Environment   Living Environment Private residence      Prior Function   Level of [place] Retired    Leisure deer hunt, yardwork      Cognition   Overall Cognitive Status Within Functional Limits for tasks assessed      Observation/Other Assessments   Observations mild edema in Lt shoulder      Sensation   Light Touch Appears Intact      ROM / Strength   AROM / PROM / Strength AROM;PROM;Strength      AROM   AROM Assessment Site Cervical;Shoulder    Right/Left Shoulder Left    Left Shoulder Flexion 100 Degrees    Left Shoulder ABduction 80 Degrees    Left Shoulder Internal Rotation --   [facility]   Left Shoulder External Rotation 50 Degrees   also to occiput behind head   Cervical Flexion 75%    Cervical Extension 75%    Cervical - Right Side Bend 25%    Cervical - Left Side Bend 50%    Cervical - Right Rotation 60    Cervical - Left Rotation 35      PROM   PROM Assessment Site Shoulder    Right/Left Shoulder Left    Left Shoulder Flexion 150 Degrees    Left Shoulder ABduction 135 Degrees      Strength   Overall Strength Comments Rt arm WNL strength    Strength Assessment Site Elbow;Shoulder    Right/Left Shoulder Right;Left    Right Shoulder Flexion --   in his available ROM   Left Shoulder Flexion 4/5    Left Shoulder ABduction 3/5    Left Shoulder Internal Rotation 4+/5    Left Shoulder External Rotation 4+/5    Right/Left Elbow Left    Left Elbow Flexion 4+/5    Left Elbow Extension 4/5      Palpation   Palpation comment WFL GH mobility except mildly decreased with inferior glide      Special Tests   Other special tests negative spurlings test for RTC pathology                      Objective  measurements completed on examination: See above findings.       [facility] Adult PT Treatment/Exercise - 12/28/19  0001      Exercises   Exercises Shoulder      Shoulder Exercises: Supine   Flexion AAROM;10 reps    Flexion Limitations dow    ABduction AAROM;10 reps    ABduction Limitations dow      Shoulder Exercises: Seated   ABduction Limitations tableslide AAROM 10 reps holding 5 sec      Shoulder Exercises: Standing   Flexion Limitations wall walk 5 reps      Shoulder Exercises: Isometric Strengthening   External Rotation 5X5"    ABduction 5X5"      Modalities   Modalities Vasopneumatic      Vasopneumatic   Number Minutes Vasopneumatic  10 minutes    Vasopnuematic Location  Shoulder    Vasopneumatic Pressure Medium    Vasopneumatic Temperature  34      Manual Therapy   Manual therapy comments Lt shoulder PROM gentle with emphasis on flexion and abd, gentle grade 1 inferior glides                  PT Education - 12/28/19 1235    Education Details HEP, POC    Person(s) Educated Patient    Methods Explanation;Demonstration;Verbal cues;Handout    Comprehension Verbalized understanding;Returned demonstration            PT Short Term Goals - 12/28/19 1240      PT SHORT TERM GOAL #1   Title Pt will be I and compliant with initial HEP.    Time 4    Period Weeks    Status New    Target Date 01/25/20             PT Long Term Goals - 12/28/19 1240      PT LONG TERM GOAL #1   Title Pt will be I and compliant with final HEP.    Time 8    Period Weeks    Status New    Target Date 02/22/20      PT LONG TERM GOAL #2   Title Pt will improve Lt shoulder AROM to [facility] >150 for flexion and abd    Time 8    Period Weeks    Status New      PT LONG TERM GOAL #3   Title Pt will improve Lt shoulder strength to at least 4+/5 to improve function.    Time 8    Period Weeks    Status New      PT LONG TERM GOAL #4   Title Pt will be able to perform usual  ADLS including driving, UE dressing, reaching for remote all with 3/10 pain or less.    Time 8    Period Weeks                  Plan - 12/28/19 1236    Clinical Impression Statement Pt presents with chronic Lt shoulder/neck pain. He did not have radicular symptoms and spurlings test negative today. MD noted adhesive capsilutis but this is very mild and may have improved since injection. He does have signs of RTC pathology as he is weak and painful with abd ER. He will benefit from skilled PT to address his functional UE deficits in strenght, ROM, pain, and functional reaching.    Personal Factors and Comorbidities Comorbidity 3+    Comorbidities PMH: DM, heart failure,CAD,HTN,Scoliosis, spinal stenosis and spondylolysis, lumbar decompression surgery 2020.    Examination-Participation Restrictions Cleaning;Driving;Yard Work    [employer] Evolving/Moderate complexity  Clinical Decision Making Moderate    Rehab Potential Good    PT Frequency 2x / week    PT Duration 8 weeks    PT Treatment/Interventions ADLs/Self Care Home Management;Electrical Stimulation;Iontophoresis 4mg /ml Dexamethasone;Moist Heat;Ultrasound;Traction;Neuromuscular re-education;Therapeutic exercise;Therapeutic activities;Manual techniques;Passive range of motion;Dry needling;Joint Manipulations;Taping;Vasopneumatic Device    PT Next Visit Plan needs gentle shoulder AAROM and PROM, isometric strength until pain decreased with AROM    PT Home Exercise Plan Access Code: [email or web address]    Consulted and Agree with Plan of Care Patient           Patient will benefit from skilled therapeutic intervention in order to improve the following deficits and impairments:  Decreased activity tolerance, Decreased mobility, Decreased range of motion, Decreased strength, Increased edema, Hypomobility, Increased fascial restricitons, Increased muscle spasms, Impaired flexibility, Postural dysfunction, Pain  Visit  Diagnosis: Chronic left shoulder pain  Stiffness of left shoulder, not elsewhere classified  Muscle weakness (generalized)  Cervicalgia  Localized edema     Problem List Patient Active Problem List   Diagnosis Date Noted  . Rotator cuff disorder 08/09/2019  . Sinusitis 04/27/2019  . BPH associated with nocturia 11/29/2018  . 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 Encounter for Medicare annual wellness exam 08/17/2018  . Spinal stenosis of lumbar region 08/02/2018  . Flu-like symptoms 05/05/2018  . Bacterial conjunctivitis of both eyes 04/27/2018  . Atypical mole 07/30/2017  . Healthcare maintenance 07/30/2017  . Wheezing 07/21/2017  . Acute bronchitis 07/21/2017  . Neck pain 06/08/2017  . Sinus bradycardia 03/18/2017  . Screening for colon cancer 02/25/2017  . Ventral hernia without obstruction or gangrene 02/25/2017  . Chronic heart failure with preserved ejection fraction ([place]) 09/28/2016  . Ischemic cardiomyopathy 09/28/2016  . Cough in adult 07/22/2016  . Vitamin D deficiency 06/27/2016  . Vitamin B deficiency 06/27/2016  . Hyperlipidemia LDL goal <70 06/21/2016  . Weakness of both lower extremities 06/21/2016  . Diabetes mellitus without complication ([place]) 25/49/8264  . Essential hypertension 05/20/2016  . Hyperlipidemia associated with type 2 diabetes mellitus ([place]) 05/20/2016  . Coronary artery disease of native artery of native heart with stable angina pectoris [facility]) 05/20/2016    Silvestre Mesi 12/28/2019, 12:43 PM  [facility] Physical Therapy [address] [place], [place], [address] Phone: [phone]   Fax:  [phone]  Name: Bonifacio Pruden MRN: [id number] Date of Birth: Mar 18, 1941

## 2020-01-02 ENCOUNTER — Other Ambulatory Visit: Payer: Self-pay

## 2020-01-02 ENCOUNTER — Ambulatory Visit (INDEPENDENT_AMBULATORY_CARE_PROVIDER_SITE_OTHER): Payer: Medicare Other | Admitting: Physical Therapy

## 2020-01-02 ENCOUNTER — Encounter: Payer: Self-pay | Admitting: Physical Therapy

## 2020-01-02 DIAGNOSIS — G8929 Other chronic pain: Secondary | ICD-10-CM

## 2020-01-02 DIAGNOSIS — M25612 Stiffness of left shoulder, not elsewhere classified: Secondary | ICD-10-CM

## 2020-01-02 DIAGNOSIS — M6281 Muscle weakness (generalized): Secondary | ICD-10-CM | POA: Diagnosis not present

## 2020-01-02 DIAGNOSIS — R600 Localized edema: Secondary | ICD-10-CM

## 2020-01-02 DIAGNOSIS — M25512 Pain in left shoulder: Secondary | ICD-10-CM

## 2020-01-02 DIAGNOSIS — M542 Cervicalgia: Secondary | ICD-10-CM

## 2020-01-02 NOTE — Therapy (Signed)
[facility] [address] [place], [place], [address] Phone: [phone]   Fax:  [phone]  Physical Therapy Treatment  Patient Details  Name: Jacob Harrison MRN: [id number] Date of Birth: Nov 12, 1940 Referring Provider (PT): Magnus Sinning, MD   Encounter Date: 01/02/2020   PT End of Session - 01/02/20 1327    Visit Number 3    Number of Visits 16    Date for PT Re-Evaluation 02/22/20    Authorization Type UHC MCR    Progress Note Due on Visit 10    PT Start Time 1300    PT Stop Time 1338    PT Time Calculation (min) 38 min    Activity Tolerance Patient tolerated treatment well    Behavior During Therapy [facility] for tasks assessed/performed           Past Medical History:  Diagnosis Date  . (HFpEF) heart failure with preserved ejection fraction ([place])    a. 10/2016 Echo: EF 65-70%, mild LVH, mildly dil LA. RV fxn nl.  . Arthritis   . BPH (benign prostatic hyperplasia)   . Coronary artery disease    a. 2001 CABG x 3 [facility]): LIMA->LAD, VG->LCX, VG->RPDA; b. 04/05/2010 Cath ([place]): LM 22m, LAD 159m, LCX 5m, RCA 100p, LIMA->LAD atretic, VG->LCX 100, VG->RCA 80ost, 70d; c. 04/2010 Redo CABG x 2 ([place]): VG->LAD, VG->RPDA; d. 10/2012 Cath [facility]arlene [facility]): LM 20-30, LAD 100/95-68m, LCX 40-73m, RCA 100p, 80-100d, RPDA 99, RPL1 95, VG->LAD ok, VG->RPDA ok, EF 67%; e. 11/2016 MV: EF 48%, No ischemia.  . 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 Hyperlipidemia   . Hypertension   . Scoliosis   . Spinal stenosis     Past Surgical History:  Procedure Laterality Date  . CARDIAC CATHETERIZATION    . CORONARY ARTERY BYPASS GRAFT     x 2 (2001 and redo in late 2011 or early 2012)  . HERNIA REPAIR Left    inguinal  . LUMBAR LAMINECTOMY/DECOMPRESSION MICRODISCECTOMY N/A 10/20/2018   Procedure: L3-4, L4-5 LUMBAR DECOMPRESSION with dural repair.;  Surgeon: Marybelle Killings, MD;  Location: [place];  Service: Orthopedics;  Laterality:  N/A;  . TONSILLECTOMY AND ADENOIDECTOMY    . TRANSURETHRAL RESECTION OF PROSTATE  1990    There were no vitals filed for this visit.   Subjective Assessment - 01/02/20 1316    Subjective relays his Lt shoulder pain is doing better and he is not having as much stiffness    Pertinent History PMH: DM, heart failure,CAD,HTN,Scoliosis, spinal stenosis and spondylolysis, lumbar decompression surgery 2020.    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", Lt shoulder XR "Left shoulder Rotator cuff supraspinatus calcific tendinopathy    Patient Stated Goals get the pain down and use my arm more    Pain Onset More than a month ago                             [facility] Adult PT Treatment/Exercise - 01/02/20 0001      Shoulder Exercises: Standing   External Rotation 20 reps;Left    Theraband Level (Shoulder External Rotation) Level 2 (Red)    Extension Both;20 reps    Theraband Level (Shoulder Extension) Level 3 (Green)    Row Both;20 reps    Theraband Level (Shoulder Row) Level 3 (Green)      Shoulder Exercises: Pulleys  Flexion Limitations 2.5 min, holding 10 sec    ABduction Limitations 2.5 min holding 10 sec      Shoulder Exercises: ROM/Strengthening   UBE (Upper Arm Bike) 5 min total, 2.5 min fwd, 2.5 min retro    Nustep holding 5 sec X 10 reps for flexion, scaption, and cirlces into flexion    Wall Wash wall ladder X 10 reps flexion and 10 reps abd, holding 5 sec      Vasopneumatic   Number Minutes Vasopneumatic  10 minutes    Vasopnuematic Location  Shoulder    Vasopneumatic Pressure Medium    Vasopneumatic Temperature  34      Manual Therapy   Manual therapy comments Lt shoulder PROM gentle with emphasis on flexion and abd, gentle grade 1 inferior glides                    PT Short Term Goals - 12/28/19 1240      PT SHORT TERM GOAL #1   Title Pt will be I  and compliant with initial HEP.    Time 4    Period Weeks    Status New    Target Date 01/25/20             PT Long Term Goals - 12/28/19 1240      PT LONG TERM GOAL #1   Title Pt will be I and compliant with final HEP.    Time 8    Period Weeks    Status New    Target Date 02/22/20      PT LONG TERM GOAL #2   Title Pt will improve Lt shoulder AROM to [facility] >150 for flexion and abd    Time 8    Period Weeks    Status New      PT LONG TERM GOAL #3   Title Pt will improve Lt shoulder strength to at least 4+/5 to improve function.    Time 8    Period Weeks    Status New      PT LONG TERM GOAL #4   Title Pt will be able to perform usual ADLS including driving, UE dressing, reaching for remote all with 3/10 pain or less.    Time 8    Period Weeks                 Plan - 01/02/20 1327    Clinical Impression Statement Session focused on ROM and RTC strength as tolerated with good overall exercise tolerance. ROM has improved since eval. Continue POC    Personal Factors and Comorbidities Comorbidity 3+    Comorbidities PMH: DM, heart failure,CAD,HTN,Scoliosis, spinal stenosis and spondylolysis, lumbar decompression surgery 2020.    Examination-Participation Restrictions Cleaning;Driving;Yard Work    Merchant [employer] officer Evolving/Moderate complexity    Rehab Potential Good    PT Frequency 2x / week    PT Duration 8 weeks    PT Treatment/Interventions ADLs/Self Care Home Management;Electrical Stimulation;Iontophoresis 4mg /ml Dexamethasone;Moist Heat;Ultrasound;Traction;Neuromuscular re-education;Therapeutic exercise;Therapeutic activities;Manual techniques;Passive range of motion;Dry needling;Joint Manipulations;Taping;Vasopneumatic Device    PT Next Visit Plan needs ROM and RTC strength    PT Home Exercise Plan Access Code: [email or web address]    Consulted and Agree with Plan of Care Patient           Patient will benefit from skilled therapeutic intervention in  order to improve the following deficits and impairments:  Decreased activity tolerance, Decreased mobility, Decreased range of motion, Decreased strength, Increased edema, Hypomobility,  Increased fascial restricitons, Increased muscle spasms, Impaired flexibility, Postural dysfunction, Pain  Visit Diagnosis: Chronic left shoulder pain  Stiffness of left shoulder, not elsewhere classified  Muscle weakness (generalized)  Cervicalgia  Localized edema     Problem List Patient Active Problem List   Diagnosis Date Noted  . Rotator cuff disorder 08/09/2019  . Sinusitis 04/27/2019  . BPH associated with nocturia 11/29/2018  . 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 Encounter for Medicare annual wellness exam 08/17/2018  . Spinal stenosis of lumbar region 08/02/2018  . Flu-like symptoms 05/05/2018  . Bacterial conjunctivitis of both eyes 04/27/2018  . Atypical mole 07/30/2017  . Healthcare maintenance 07/30/2017  . Wheezing 07/21/2017  . Acute bronchitis 07/21/2017  . Neck pain 06/08/2017  . Sinus bradycardia 03/18/2017  . Screening for colon cancer 02/25/2017  . Ventral hernia without obstruction or gangrene 02/25/2017  . Chronic heart failure with preserved ejection fraction ([place]) 09/28/2016  . Ischemic cardiomyopathy 09/28/2016  . Cough in adult 07/22/2016  . Vitamin D deficiency 06/27/2016  . Vitamin B deficiency 06/27/2016  . Hyperlipidemia LDL goal <70 06/21/2016  . Weakness of both lower extremities 06/21/2016  . Diabetes mellitus without complication ([place]) 44/96/7591  . Essential hypertension 05/20/2016  . Hyperlipidemia associated with type 2 diabetes mellitus ([place]) 05/20/2016  . Coronary artery disease of native artery of native heart with stable angina pectoris ([place]) 05/20/2016    Silvestre Mesi 01/02/2020, 1:38 PM  [facility] Physical Therapy [address] [place], [place], [address] Phone:  [phone]   Fax:  [phone]  Name: Jacob Harrison MRN: [id number] Date of Birth: 1941-03-31

## 2020-01-04 ENCOUNTER — Other Ambulatory Visit: Payer: Self-pay

## 2020-01-04 ENCOUNTER — Ambulatory Visit (INDEPENDENT_AMBULATORY_CARE_PROVIDER_SITE_OTHER): Payer: Medicare Other | Admitting: Physical Therapy

## 2020-01-04 ENCOUNTER — Encounter: Payer: Self-pay | Admitting: Physical Therapy

## 2020-01-04 DIAGNOSIS — R600 Localized edema: Secondary | ICD-10-CM | POA: Diagnosis not present

## 2020-01-04 DIAGNOSIS — M6281 Muscle weakness (generalized): Secondary | ICD-10-CM | POA: Diagnosis not present

## 2020-01-04 DIAGNOSIS — M542 Cervicalgia: Secondary | ICD-10-CM

## 2020-01-04 DIAGNOSIS — M25612 Stiffness of left shoulder, not elsewhere classified: Secondary | ICD-10-CM | POA: Diagnosis not present

## 2020-01-04 DIAGNOSIS — M25512 Pain in left shoulder: Secondary | ICD-10-CM

## 2020-01-04 DIAGNOSIS — G8929 Other chronic pain: Secondary | ICD-10-CM

## 2020-01-04 NOTE — Therapy (Signed)
[facility] Physical Therapy [address] [place], [place], [address] Phone: [phone]   Fax:  [phone]  Physical Therapy Treatment  Patient Details  Name: Jacob Harrison MRN: [id number] Date of Birth: 17-May-1940 Referring Provider (PT): Magnus Sinning, MD   Encounter Date: 01/04/2020   PT End of Session - 01/04/20 1337    Visit Number 4    Number of Visits 16    Date for PT Re-Evaluation 02/22/20    Authorization Type UHC MCR    Progress Note Due on Visit 10    PT Start Time 1258    PT Stop Time 1336    PT Time Calculation (min) 38 min    Activity Tolerance Patient tolerated treatment well    Behavior During Therapy [facility] for tasks assessed/performed           Past Medical History:  Diagnosis Date   (HFpEF) heart failure with preserved ejection fraction ([place])    a. 10/2016 Echo: EF 65-70%, mild LVH, mildly dil LA. RV fxn nl.   Arthritis    BPH (benign prostatic hyperplasia)    Coronary artery disease    a. 2001 CABG x 3 [facility]): LIMA->LAD, VG->LCX, VG->RPDA; b. 04/05/2010 Cath ([place]): LM 66m, LAD 155m, LCX 70m, RCA 100p, LIMA->LAD atretic, VG->LCX 100, VG->RCA 80ost, 70d; c. 04/2010 Redo CABG x 2 ([place]): VG->LAD, VG->RPDA; d. 10/2012 Cath [facility]arlene [facility]): LM 20-30, LAD 100/95-70m, LCX 40-85m, RCA 100p, 80-100d, RPDA 99, RPL1 95, VG->LAD ok, VG->RPDA ok, EF 67%; e. 11/2016 MV: EF 48%, No ischemia.   Diabetes mellitus without complication ([place])    Type II   Hyperlipidemia    Hypertension    Scoliosis    Spinal stenosis     Past Surgical History:  Procedure Laterality Date   CARDIAC CATHETERIZATION     CORONARY ARTERY BYPASS GRAFT     x 2 (2001 and redo in late 2011 or early 2012)   HERNIA REPAIR Left    inguinal   LUMBAR LAMINECTOMY/DECOMPRESSION MICRODISCECTOMY N/A 10/20/2018   Procedure: L3-4, L4-5 LUMBAR DECOMPRESSION with dural repair.;  Surgeon: Marybelle Killings, MD;  Location: [place];  Service: Orthopedics;  Laterality:  N/A;   TONSILLECTOMY AND ADENOIDECTOMY     TRANSURETHRAL RESECTION OF PROSTATE  1990    There were no vitals filed for this visit.   Subjective Assessment - 01/04/20 1309    Subjective relays his left shoulder is doing really well today    Pertinent History PMH: DM, heart failure,CAD,HTN,Scoliosis, spinal stenosis and spondylolysis, lumbar decompression surgery 2020.    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", Lt shoulder XR "Left shoulder Rotator cuff supraspinatus calcific tendinopathy    Patient Stated Goals get the pain down and use my arm more    Pain Onset More than a month ago                             [facility] Adult PT Treatment/Exercise - 01/04/20 0001      Shoulder Exercises: Standing   Theraband Level (Shoulder External Rotation) Level 2 (Red)    External Rotation Limitations Lt 2 x 15    Theraband Level (Shoulder Internal Rotation) Level 2 (Red)    Internal Rotation Limitations Lt 2X15    Theraband Level (Shoulder Extension) Level 3 (Green)    Extension Limitations both 2X15    Theraband Level (Shoulder Row) Level 3 (Green)  Row Limitations both 2X15      Shoulder Exercises: Pulleys   Flexion Limitations 3 min holding 10 sec    ABduction Limitations 3 min holding 5 sec      Shoulder Exercises: ROM/Strengthening   Ranger large circles in flexion 15 reps ea, then  plane, then abduction 15 reps    Wall Wash wall ladder X 10 reps flexion and 10 reps abd, holding 5 sec      Manual Therapy   Manual therapy comments Lt shoulder PROM gentle with emphasis on flexion and abd, gentle grade 1 inferior glides                    PT Short Term Goals - 12/28/19 1240      PT SHORT TERM GOAL #1   Title Pt will be I and compliant with initial HEP.    Time 4    Period Weeks    Status New    Target Date 01/25/20             PT Long Term Goals -  12/28/19 1240      PT LONG TERM GOAL #1   Title Pt will be I and compliant with final HEP.    Time 8    Period Weeks    Status New    Target Date 02/22/20      PT LONG TERM GOAL #2   Title Pt will improve Lt shoulder AROM to [facility] >150 for flexion and abd    Time 8    Period Weeks    Status New      PT LONG TERM GOAL #3   Title Pt will improve Lt shoulder strength to at least 4+/5 to improve function.    Time 8    Period Weeks    Status New      PT LONG TERM GOAL #4   Title Pt will be able to perform usual ADLS including driving, UE dressing, reaching for remote all with 3/10 pain or less.    Time 8    Period Weeks                 Plan - 01/04/20 1338    Clinical Impression Statement He is progressing well and able to show more ROM and progress resistance and or reps today. Pt will Continue to progress as albe.    Personal Factors and Comorbidities Comorbidity 3+    Comorbidities PMH: DM, heart failure,CAD,HTN,Scoliosis, spinal stenosis and spondylolysis, lumbar decompression surgery 2020.    Examination-Participation Restrictions Cleaning;Driving;Yard Work    Merchant [employer] officer Evolving/Moderate complexity    Rehab Potential Good    PT Frequency 2x / week    PT Duration 8 weeks    PT Treatment/Interventions ADLs/Self Care Home Management;Electrical Stimulation;Iontophoresis 4mg /ml Dexamethasone;Moist Heat;Ultrasound;Traction;Neuromuscular re-education;Therapeutic exercise;Therapeutic activities;Manual techniques;Passive range of motion;Dry needling;Joint Manipulations;Taping;Vasopneumatic Device    PT Next Visit Plan needs ROM and RTC strength    PT Home Exercise Plan Access Code: [email or web address]    Consulted and Agree with Plan of Care Patient           Patient will benefit from skilled therapeutic intervention in order to improve the following deficits and impairments:  Decreased activity tolerance, Decreased mobility, Decreased range of motion,  Decreased strength, Increased edema, Hypomobility, Increased fascial restricitons, Increased muscle spasms, Impaired flexibility, Postural dysfunction, Pain  Visit Diagnosis: Chronic left shoulder pain  Stiffness of left shoulder, not elsewhere classified  Muscle weakness (generalized)  Cervicalgia  Localized edema     Problem List Patient Active Problem List   Diagnosis Date Noted   Rotator cuff disorder 08/09/2019   Sinusitis 04/27/2019   BPH associated with nocturia 11/29/2018   Status post lumbar spine surgery for decompression of spinal cord 10/29/2018   Encounter for Medicare annual wellness exam 08/17/2018   Spinal stenosis of lumbar region 08/02/2018   Flu-like symptoms 05/05/2018   Bacterial conjunctivitis of both eyes 04/27/2018   Atypical mole 07/30/2017   Healthcare maintenance 07/30/2017   Wheezing 07/21/2017   Acute bronchitis 07/21/2017   Neck pain 06/08/2017   Sinus bradycardia 03/18/2017   Screening for colon cancer 02/25/2017   Ventral hernia without obstruction or gangrene 02/25/2017   Chronic heart failure with preserved ejection fraction ([place]) 09/28/2016   Ischemic cardiomyopathy 09/28/2016   Cough in adult 07/22/2016   Vitamin D deficiency 06/27/2016   Vitamin B deficiency 06/27/2016   Hyperlipidemia LDL goal <70 06/21/2016   Weakness of both lower extremities 06/21/2016   Diabetes mellitus without complication ([place]) 70/34/0352   Essential hypertension 05/20/2016   Hyperlipidemia associated with type 2 diabetes mellitus ([place]) 05/20/2016   Coronary artery disease of native artery of native heart with stable angina pectoris ([place]) 05/20/2016    Silvestre Mesi 01/04/2020, 1:39 PM  [facility] Physical Therapy [address] [place], [place], [address] Phone: [phone]   Fax:  [phone]  Name: Jacob Harrison MRN: [id number] Date of Birth: 1940/10/18

## 2020-01-09 ENCOUNTER — Other Ambulatory Visit: Payer: Self-pay

## 2020-01-09 ENCOUNTER — Ambulatory Visit (INDEPENDENT_AMBULATORY_CARE_PROVIDER_SITE_OTHER): Payer: Medicare Other | Admitting: Physical Therapy

## 2020-01-09 DIAGNOSIS — M542 Cervicalgia: Secondary | ICD-10-CM | POA: Diagnosis not present

## 2020-01-09 DIAGNOSIS — M25612 Stiffness of left shoulder, not elsewhere classified: Secondary | ICD-10-CM

## 2020-01-09 DIAGNOSIS — R600 Localized edema: Secondary | ICD-10-CM | POA: Diagnosis not present

## 2020-01-09 DIAGNOSIS — M6281 Muscle weakness (generalized): Secondary | ICD-10-CM | POA: Diagnosis not present

## 2020-01-09 DIAGNOSIS — M25512 Pain in left shoulder: Secondary | ICD-10-CM | POA: Diagnosis not present

## 2020-01-09 DIAGNOSIS — G8929 Other chronic pain: Secondary | ICD-10-CM

## 2020-01-09 NOTE — Therapy (Signed)
[facility] Physical Therapy [address] [place], [place], [address] Phone: [phone]   Fax:  [phone]  Physical Therapy Treatment  Patient Details  Name: Jacob Harrison MRN: [id number] Date of Birth: [birth year] Referring Provider (PT): Magnus Sinning, MD   Encounter Date: 01/09/2020   PT End of Session - 01/09/20 1231    Visit Number 5    Number of Visits 16    Date for PT Re-Evaluation 02/22/20    Authorization Type UHC MCR    Progress Note Due on Visit 10    PT Start Time 6789    PT Stop Time 1227    PT Time Calculation (min) 42 min    Activity Tolerance Patient tolerated treatment well    Behavior During Therapy [facility] for tasks assessed/performed           Past Medical History:  Diagnosis Date  . (HFpEF) heart failure with preserved ejection fraction ([place])    a. 10/2016 Echo: EF 65-70%, mild LVH, mildly dil LA. RV fxn nl.  . Arthritis   . BPH (benign prostatic hyperplasia)   . Coronary artery disease    a. 2001 CABG x 3 [facility]): LIMA->LAD, VG->LCX, VG->RPDA; b. 04/05/2010 Cath ([place]): LM 9m, LAD 167m, LCX 45m, RCA 100p, LIMA->LAD atretic, VG->LCX 100, VG->RCA 80ost, 70d; c. 04/2010 Redo CABG x 2 ([place]): VG->LAD, VG->RPDA; d. 10/2012 Cath Sharlene [facility]): LM 20-30, LAD 100/95-81m, LCX 40-56m, RCA 100p, 80-100d, RPDA 99, RPL1 95, VG->LAD ok, VG->RPDA ok, EF 67%; e. 11/2016 MV: EF 48%, No ischemia.  . 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 Hyperlipidemia   . Hypertension   . Scoliosis   . Spinal stenosis     Past Surgical History:  Procedure Laterality Date  . CARDIAC CATHETERIZATION    . CORONARY ARTERY BYPASS GRAFT     x 2 (2001 and redo in late 2011 or early 2012)  . HERNIA REPAIR Left    inguinal  . LUMBAR LAMINECTOMY/DECOMPRESSION MICRODISCECTOMY N/A 10/20/2018   Procedure: L3-4, L4-5 LUMBAR DECOMPRESSION with dural repair.;  Surgeon: Marybelle Killings, MD;  Location: [place];  Service: Orthopedics;  Laterality:  N/A;  . TONSILLECTOMY AND ADENOIDECTOMY    . TRANSURETHRAL RESECTION OF PROSTATE  1990    There were no vitals filed for this visit.   Subjective Assessment - 01/09/20 1215    Subjective denies pain upon arrival    Pertinent History PMH: DM, heart failure,CAD,HTN,Scoliosis, spinal stenosis and spondylolysis, lumbar decompression surgery 2020.    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, Cervicalgia    Referring Provider (PT) Magnus Sinning, MD      AROM   Left Shoulder Flexion 145 Degrees    Left Shoulder ABduction 120 Degrees    Left Shoulder External Rotation --   C7 behind head                        [facility] Adult PT Treatment/Exercise - 01/09/20 0001  Shoulder Exercises: Standing   Theraband Level (Shoulder External Rotation) Level 3 (Green)    External Rotation Limitations Lt 2 x 15    Theraband Level (Shoulder Internal Rotation) Level 3 (Green)    Internal Rotation Limitations Lt 2X15    Flexion Both    Shoulder Flexion Weight (lbs) 2    Flexion Limitations 2X10    ABduction Both    Shoulder ABduction Weight (lbs) 2X10    Theraband Level (Shoulder Extension) Level 3 (Green)    Extension Limitations both 2X15    Theraband Level (Shoulder Row) Level 3 (Green)    Row Limitations both 2X15      Shoulder Exercises: Pulleys   Flexion Limitations 3 min holding 10 sec    ABduction Limitations 3 min holding 5 sec      Shoulder Exercises: ROM/Strengthening   UBE (Upper Arm Bike) L3 3 min fwd, 3 min retro    Ranger large circles in flexion 15 reps  ea, then  plane, then abduction 15 reps    Wall Wash wall ladder X 10 reps flexion and 10 reps abd, holding 5 sec      Manual Therapy   Manual therapy comments Lt shoulder PROM gentle with emphasis on flexion and abd, gentle grade 1 inferior glides                    PT Short Term Goals - 12/28/19 1240      PT SHORT TERM GOAL #1   Title Pt will be I and compliant with initial HEP.    Time 4    Period Weeks    Status New    Target Date 01/25/20             PT Long Term Goals - 12/28/19 1240      PT LONG TERM GOAL #1   Title Pt will be I and compliant with final HEP.    Time 8    Period Weeks    Status New    Target Date 02/22/20      PT LONG TERM GOAL #2   Title Pt will improve Lt shoulder AROM to [facility] >150 for flexion and abd    Time 8    Period Weeks    Status New      PT LONG TERM GOAL #3   Title Pt will improve Lt shoulder strength to at least 4+/5 to improve function.    Time 8    Period Weeks    Status New      PT LONG TERM GOAL #4   Title Pt will be able to perform usual ADLS including driving, UE dressing, reaching for remote all with 3/10 pain or less.    Time 8    Period Weeks                 Plan - 01/09/20 1232    Clinical Impression Statement updated measurements show improvements in overall shoulder ROM. PT will progress strengthening as tolerated.    Personal Factors and Comorbidities Comorbidity 3+    Comorbidities PMH: DM, heart failure,CAD,HTN,Scoliosis, spinal stenosis and spondylolysis, lumbar decompression surgery 2020.    Examination-Participation Restrictions Cleaning;Driving;Yard Work    Merchant [employer] officer Evolving/Moderate complexity    Rehab Potential Good    PT Frequency 2x / week    PT Duration 8 weeks    PT Treatment/Interventions ADLs/Self Care Home Management;Electrical Stimulation;Iontophoresis 4mg /ml Dexamethasone;Moist Heat;Ultrasound;Traction;Neuromuscular re-education;Therapeutic  exercise;Therapeutic activities;Manual techniques;Passive range of motion;Dry needling;Joint  Manipulations;Taping;Vasopneumatic Device    PT Next Visit Plan needs ROM and RTC strength    PT Home Exercise Plan Access Code: [email or web address]    Consulted and Agree with Plan of Care Patient           Patient will benefit from skilled therapeutic intervention in order to improve the following deficits and impairments:  Decreased activity tolerance, Decreased mobility, Decreased range of motion, Decreased strength, Increased edema, Hypomobility, Increased fascial restricitons, Increased muscle spasms, Impaired flexibility, Postural dysfunction, Pain  Visit Diagnosis: Chronic left shoulder pain  Stiffness of left shoulder, not elsewhere classified  Muscle weakness (generalized)  Cervicalgia  Localized edema     Problem List Patient Active Problem List   Diagnosis Date Noted  . Rotator cuff disorder 08/09/2019  . Sinusitis 04/27/2019  . BPH associated with nocturia 11/29/2018  . 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 Encounter for Medicare annual wellness exam 08/17/2018  . Spinal stenosis of lumbar region 08/02/2018  . Flu-like symptoms 05/05/2018  . Bacterial conjunctivitis of both eyes 04/27/2018  . Atypical mole 07/30/2017  . Healthcare maintenance 07/30/2017  . Wheezing 07/21/2017  . Acute bronchitis 07/21/2017  . Neck pain 06/08/2017  . Sinus bradycardia 03/18/2017  . Screening for colon cancer 02/25/2017  . Ventral hernia without obstruction or gangrene 02/25/2017  . Chronic heart failure with preserved ejection fraction ([place]) 09/28/2016  . Ischemic cardiomyopathy 09/28/2016  . Cough in adult 07/22/2016  . Vitamin D deficiency 06/27/2016  . Vitamin B deficiency 06/27/2016  . Hyperlipidemia LDL goal <70 06/21/2016  . Weakness of both lower extremities 06/21/2016  . Diabetes mellitus without complication ([place]) 24/23/5361  . Essential  hypertension 05/20/2016  . Hyperlipidemia associated with type 2 diabetes mellitus ([place]) 05/20/2016  . Coronary artery disease of native artery of native heart with stable angina pectoris [facility]) 05/20/2016    Silvestre Mesi 01/09/2020, 12:33 PM  [facility] Physical Therapy [address] [place], [place], [address] Phone: [phone]   Fax:  [phone]  Name: Jacob Harrison MRN: [id number] Date of Birth: Sep 11, 1940

## 2020-01-11 ENCOUNTER — Other Ambulatory Visit: Payer: Self-pay

## 2020-01-11 ENCOUNTER — Ambulatory Visit (INDEPENDENT_AMBULATORY_CARE_PROVIDER_SITE_OTHER): Payer: Medicare Other | Admitting: Physical Therapy

## 2020-01-11 DIAGNOSIS — M25512 Pain in left shoulder: Secondary | ICD-10-CM

## 2020-01-11 DIAGNOSIS — M542 Cervicalgia: Secondary | ICD-10-CM

## 2020-01-11 DIAGNOSIS — M25612 Stiffness of left shoulder, not elsewhere classified: Secondary | ICD-10-CM

## 2020-01-11 DIAGNOSIS — R600 Localized edema: Secondary | ICD-10-CM

## 2020-01-11 DIAGNOSIS — M6281 Muscle weakness (generalized): Secondary | ICD-10-CM | POA: Diagnosis not present

## 2020-01-11 DIAGNOSIS — G8929 Other chronic pain: Secondary | ICD-10-CM

## 2020-01-11 NOTE — Therapy (Signed)
[facility] Physical Therapy [address] [place], [place], [address] Phone: [phone]   Fax:  [phone]  Physical Therapy Treatment  Patient Details  Name: Ryaan Vanwagoner MRN: [id number] Date of Birth: 02-Feb-1941 Referring Provider (PT): Magnus Sinning, MD   Encounter Date: 01/11/2020   PT End of Session - 01/11/20 1334    Visit Number 6    Number of Visits 16    Date for PT Re-Evaluation 02/22/20    Authorization Type UHC MCR    Progress Note Due on Visit 10    PT Start Time 1300    PT Stop Time 1343    PT Time Calculation (min) 43 min    Activity Tolerance Patient tolerated treatment well    Behavior During Therapy [facility] for tasks assessed/performed           Past Medical History:  Diagnosis Date  . (HFpEF) heart failure with preserved ejection fraction ([place])    a. 10/2016 Echo: EF 65-70%, mild LVH, mildly dil LA. RV fxn nl.  . Arthritis   . BPH (benign prostatic hyperplasia)   . Coronary artery disease    a. 2001 CABG x 3 [facility]): LIMA->LAD, VG->LCX, VG->RPDA; b. 04/05/2010 Cath ([facility]): LM 34m, LAD 128m, LCX 83m, RCA 100p, LIMA->LAD atretic, VG->LCX 100, VG->RCA 80ost, 70d; c. 04/2010 Redo CABG x 2 ([place]): VG->LAD, VG->RPDA; d. 10/2012 Cath Sharlene [facility]): LM 20-30, LAD 100/95-66m, LCX 40-42m, RCA 100p, 80-100d, RPDA 99, RPL1 95, VG->LAD ok, VG->RPDA ok, EF 67%; e. 11/2016 MV: EF 48%, No ischemia.  . 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 Hyperlipidemia   . Hypertension   . Scoliosis   . Spinal stenosis     Past Surgical History:  Procedure Laterality Date  . CARDIAC CATHETERIZATION    . CORONARY ARTERY BYPASS GRAFT     x 2 (2001 and redo in late 2011 or early 2012)  . HERNIA REPAIR Left    inguinal  . LUMBAR LAMINECTOMY/DECOMPRESSION MICRODISCECTOMY N/A 10/20/2018   Procedure: L3-4, L4-5 LUMBAR DECOMPRESSION with dural repair.;  Surgeon: Marybelle Killings, MD;  Location: [facility];  Service: Orthopedics;  Laterality:  N/A;  . TONSILLECTOMY AND ADENOIDECTOMY    . TRANSURETHRAL RESECTION OF PROSTATE  1990    There were no vitals filed for this visit.   Subjective Assessment - 01/11/20 1325    Subjective no complaints about his Lt shoulder today, his neck does tighten up some    Pertinent History PMH: DM, heart failure,CAD,HTN,Scoliosis, spinal stenosis and spondylolysis, lumbar decompression surgery 2020.    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", Lt shoulder XR "Left shoulder Rotator cuff supraspinatus calcific tendinopathy    Patient Stated Goals get the pain down and use my arm more    Pain Onset More than a month ago                             [facility] Adult PT Treatment/Exercise - 01/11/20 0001      Shoulder Exercises: Standing   Theraband Level (Shoulder External Rotation) Level 3 (Green)    External Rotation Limitations Lt 2 x 15    Theraband Level (Shoulder Internal Rotation) Level 3 (Green)    Internal Rotation Limitations Lt 2X15    Flexion Both    Shoulder Flexion Weight (lbs) 3    Flexion Limitations 2X10    ABduction  Both    Shoulder ABduction Weight (lbs) 3    ABduction Limitations 2X10    Theraband Level (Shoulder Extension) Level 4 (Blue)    Extension Limitations both 2X15    Theraband Level (Shoulder Row) Level 4 (Blue)    Row Limitations both 2X15      Shoulder Exercises: Pulleys   Flexion Limitations 3 min holding 10 sec    ABduction Limitations 3 min holding 5 sec      Shoulder Exercises: ROM/Strengthening   UBE (Upper Arm Bike) L3 3 min fwd, 3 min retro    Ranger large circles in flexion 15 reps ea, then  plane, then abduction 15 reps    Wall Wash wall ladder X 10 reps flexion and 10 reps abd, holding 5 sec      Shoulder Exercises: Stretch   Other Shoulder Stretches doorway stetch 20 sec X 3 times      Manual Therapy   Manual therapy comments Lt shoulder  PROM gentle with emphasis on flexion and abd, gentle grade 1 inferior glides                    PT Short Term Goals - 12/28/19 1240      PT SHORT TERM GOAL #1   Title Pt will be I and compliant with initial HEP.    Time 4    Period Weeks    Status New    Target Date 01/25/20             PT Long Term Goals - 12/28/19 1240      PT LONG TERM GOAL #1   Title Pt will be I and compliant with final HEP.    Time 8    Period Weeks    Status New    Target Date 02/22/20      PT LONG TERM GOAL #2   Title Pt will improve Lt shoulder AROM to [facility] >150 for flexion and abd    Time 8    Period Weeks    Status New      PT LONG TERM GOAL #3   Title Pt will improve Lt shoulder strength to at least 4+/5 to improve function.    Time 8    Period Weeks    Status New      PT LONG TERM GOAL #4   Title Pt will be able to perform usual ADLS including driving, UE dressing, reaching for remote all with 3/10 pain or less.    Time 8    Period Weeks                 Plan - 01/11/20 1335    Clinical Impression Statement He continues to improve strength and ROM with PT, continue POC    Personal Factors and Comorbidities Comorbidity 3+    Comorbidities PMH: DM, heart failure,CAD,HTN,Scoliosis, spinal stenosis and spondylolysis, lumbar decompression surgery 2020.    Examination-Participation Restrictions Cleaning;Driving;Yard Work    Merchant [employer] officer Evolving/Moderate complexity    Rehab Potential Good    PT Frequency 2x / week    PT Duration 8 weeks    PT Treatment/Interventions ADLs/Self Care Home Management;Electrical Stimulation;Iontophoresis 4mg /ml Dexamethasone;Moist Heat;Ultrasound;Traction;Neuromuscular re-education;Therapeutic exercise;Therapeutic activities;Manual techniques;Passive range of motion;Dry needling;Joint Manipulations;Taping;Vasopneumatic Device    PT Next Visit Plan needs ROM and RTC strength    PT Home Exercise Plan Access Code: [email or web address]     Consulted and Agree with Plan of Care Patient  Patient will benefit from skilled therapeutic intervention in order to improve the following deficits and impairments:  Decreased activity tolerance, Decreased mobility, Decreased range of motion, Decreased strength, Increased edema, Hypomobility, Increased fascial restricitons, Increased muscle spasms, Impaired flexibility, Postural dysfunction, Pain  Visit Diagnosis: Chronic left shoulder pain  Stiffness of left shoulder, not elsewhere classified  Muscle weakness (generalized)  Cervicalgia  Localized edema     Problem List Patient Active Problem List   Diagnosis Date Noted  . Rotator cuff disorder 08/09/2019  . Sinusitis 04/27/2019  . BPH associated with nocturia 11/29/2018  . 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 Encounter for Medicare annual wellness exam 08/17/2018  . Spinal stenosis of lumbar region 08/02/2018  . Flu-like symptoms 05/05/2018  . Bacterial conjunctivitis of both eyes 04/27/2018  . Atypical mole 07/30/2017  . Healthcare maintenance 07/30/2017  . Wheezing 07/21/2017  . Acute bronchitis 07/21/2017  . Neck pain 06/08/2017  . Sinus bradycardia 03/18/2017  . Screening for colon cancer 02/25/2017  . Ventral hernia without obstruction or gangrene 02/25/2017  . Chronic heart failure with preserved ejection fraction ([place]) 09/28/2016  . Ischemic cardiomyopathy 09/28/2016  . Cough in adult 07/22/2016  . Vitamin D deficiency 06/27/2016  . Vitamin B deficiency 06/27/2016  . Hyperlipidemia LDL goal <70 06/21/2016  . Weakness of both lower extremities 06/21/2016  . Diabetes mellitus without complication ([place]) 54/12/8117  . Essential hypertension 05/20/2016  . Hyperlipidemia associated with type 2 diabetes mellitus ([place]) 05/20/2016  . Coronary artery disease of native artery of native heart with stable angina pectoris ([place]) 05/20/2016    Debbe Odea,  PT,DPT 01/11/2020, 1:47 PM  [facility] Physical Therapy [address] [place], [place], [address] Phone: [phone]   Fax:  [phone]  Name: Lain Tetterton MRN: [id number] Date of Birth: 1941/02/03

## 2020-01-16 ENCOUNTER — Encounter: Payer: Self-pay | Admitting: Rehabilitative and Restorative Service Providers"

## 2020-01-16 ENCOUNTER — Ambulatory Visit (INDEPENDENT_AMBULATORY_CARE_PROVIDER_SITE_OTHER): Payer: Medicare Other | Admitting: Rehabilitative and Restorative Service Providers"

## 2020-01-16 ENCOUNTER — Other Ambulatory Visit: Payer: Self-pay

## 2020-01-16 DIAGNOSIS — M25612 Stiffness of left shoulder, not elsewhere classified: Secondary | ICD-10-CM

## 2020-01-16 DIAGNOSIS — M542 Cervicalgia: Secondary | ICD-10-CM | POA: Diagnosis not present

## 2020-01-16 DIAGNOSIS — R600 Localized edema: Secondary | ICD-10-CM

## 2020-01-16 DIAGNOSIS — M25512 Pain in left shoulder: Secondary | ICD-10-CM

## 2020-01-16 DIAGNOSIS — M6281 Muscle weakness (generalized): Secondary | ICD-10-CM

## 2020-01-16 DIAGNOSIS — G8929 Other chronic pain: Secondary | ICD-10-CM

## 2020-01-16 NOTE — Therapy (Signed)
[facility] Physical Therapy [address] [place], [place], [address] Phone: [phone]   Fax:  [phone]  Physical Therapy Treatment  Patient Details  Name: Jacob Harrison MRN: [id number] Date of Birth: 1941/01/25 Referring Provider (PT): Magnus Sinning, MD   Encounter Date: 01/16/2020   PT End of Session - 01/16/20 1053    Visit Number 7    Number of Visits 16    Date for PT Re-Evaluation 02/22/20    Authorization Type UHC MCR    Progress Note Due on Visit 10    PT Start Time 1057    PT Stop Time 1137    PT Time Calculation (min) 40 min    Activity Tolerance Patient tolerated treatment well    Behavior During Therapy [facility] for tasks assessed/performed           Past Medical History:  Diagnosis Date   (HFpEF) heart failure with preserved ejection fraction ([place])    a. 10/2016 Echo: EF 65-70%, mild LVH, mildly dil LA. RV fxn nl.   Arthritis    BPH (benign prostatic hyperplasia)    Coronary artery disease    a. 2001 CABG x 3 [facility]): LIMA->LAD, VG->LCX, VG->RPDA; b. 04/05/2010 Cath ([place]): LM 48m, LAD 159m, LCX 22m, RCA 100p, LIMA->LAD atretic, VG->LCX 100, VG->RCA 80ost, 70d; c. 04/2010 Redo CABG x 2 ([place]): VG->LAD, VG->RPDA; d. 10/2012 Cath Sharlene [facility]): LM 20-30, LAD 100/95-25m, LCX 40-73m, RCA 100p, 80-100d, RPDA 99, RPL1 95, VG->LAD ok, VG->RPDA ok, EF 67%; e. 11/2016 MV: EF 48%, No ischemia.   Diabetes mellitus without complication ([place])    Type II   Hyperlipidemia    Hypertension    Scoliosis    Spinal stenosis     Past Surgical History:  Procedure Laterality Date   CARDIAC CATHETERIZATION     CORONARY ARTERY BYPASS GRAFT     x 2 (2001 and redo in late 2011 or early 2012)   HERNIA REPAIR Left    inguinal   LUMBAR LAMINECTOMY/DECOMPRESSION MICRODISCECTOMY N/A 10/20/2018   Procedure: L3-4, L4-5 LUMBAR DECOMPRESSION with dural repair.;  Surgeon: Marybelle Killings, MD;  Location: [facility];  Service: Orthopedics;  Laterality:  N/A;   TONSILLECTOMY AND ADENOIDECTOMY     TRANSURETHRAL RESECTION OF PROSTATE  1990    There were no vitals filed for this visit.   Subjective Assessment - 01/16/20 1059    Subjective Pt. stated no pain upon arrival today and a pretty good weekend.  Still reported weakness.    Pertinent History PMH: DM, heart failure,CAD,HTN,Scoliosis, spinal stenosis and spondylolysis, lumbar decompression surgery 2020.    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 No/denies    Pain Score 0-No pain    Pain Onset More than a month ago                             [facility] Adult PT Treatment/Exercise - 01/16/20 0001      Shoulder Exercises: Sidelying   External Rotation Right;Strengthening;Left   3 x 15 (1 set 2 lbs, 2 sets 1 lb) Rt, Lt 3 x 15 2 lbs   External Rotation Weight (lbs) 1    Flexion Right;Left   3x 15    Flexion  Weight (lbs) 2    ABduction Both   2 x 15 1 lb, x 15 2 lb Rt, 3 x 15 2 lbs on Lt   ABduction Weight (lbs) 1      Shoulder Exercises: ROM/Strengthening   UBE (Upper Arm Bike) Lvl 3 3 mins fwd/back each way                    PT Short Term Goals - 12/28/19 1240      PT SHORT TERM GOAL #1   Title Pt will be I and compliant with initial HEP.    Time 4    Period Weeks    Status New    Target Date 01/25/20             PT Long Term Goals - 12/28/19 1240      PT LONG TERM GOAL #1   Title Pt will be I and compliant with final HEP.    Time 8    Period Weeks    Status New    Target Date 02/22/20      PT LONG TERM GOAL #2   Title Pt will improve Lt shoulder AROM to [facility] >150 for flexion and abd    Time 8    Period Weeks    Status New      PT LONG TERM GOAL #3   Title Pt will improve Lt  shoulder strength to at least 4+/5 to improve function.    Time 8    Period Weeks    Status New      PT LONG TERM GOAL #4   Title Pt will be able to perform usual ADLS including driving, UE dressing, reaching for remote all with 3/10 pain or less.    Time 8    Period Weeks                 Plan - 01/16/20 1108    Clinical Impression Statement Elevation attempts c 1 lb, 2 lb for flexion revealed easy fatigue on Rt compared to Lt c mild shrug noted, progressively increasing c fatigue.  Adapted to improve endurance in sidelying as well as standing resistive exercise.    Personal Factors and Comorbidities Comorbidity 3+    Comorbidities PMH: DM, heart failure,CAD,HTN,Scoliosis, spinal stenosis and spondylolysis, lumbar decompression surgery 2020.    Examination-Participation Restrictions Cleaning;Driving;Yard Work    Merchant navy officer Evolving/Moderate complexity    Rehab Potential Good    PT Frequency 2x / week    PT Duration 8 weeks    PT Treatment/Interventions ADLs/Self Care Home Management;Electrical Stimulation;Iontophoresis 4mg /ml Dexamethasone;Moist Heat;Ultrasound;Traction;Neuromuscular re-education;Therapeutic exercise;Therapeutic activities;Manual techniques;Passive range of motion;Dry needling;Joint Manipulations;Taping;Vasopneumatic Device    PT Next Visit Plan Continue strengthening progression, endurance training.    PT Home Exercise Plan Access Code: [email or web address]    Consulted and Agree with Plan of Care Patient           Patient will benefit from skilled therapeutic intervention in order to improve the following deficits and impairments:  Decreased activity tolerance, Decreased mobility, Decreased range of motion, Decreased strength, Increased edema, Hypomobility, Increased fascial restricitons, Increased muscle spasms, Impaired flexibility, Postural dysfunction, Pain  Visit Diagnosis: Chronic left shoulder pain  Stiffness of left shoulder, not  elsewhere classified  Muscle weakness (generalized)  Cervicalgia  Localized edema     Problem List Patient Active Problem List   Diagnosis Date Noted   Rotator cuff disorder 08/09/2019   Sinusitis 04/27/2019   BPH associated with  nocturia 11/29/2018   Status post lumbar spine surgery for decompression of spinal cord 10/29/2018   Encounter for Medicare annual wellness exam 08/17/2018   Spinal stenosis of lumbar region 08/02/2018   Flu-like symptoms 05/05/2018   Bacterial conjunctivitis of both eyes 04/27/2018   Atypical mole 07/30/2017   Healthcare maintenance 07/30/2017   Wheezing 07/21/2017   Acute bronchitis 07/21/2017   Neck pain 06/08/2017   Sinus bradycardia 03/18/2017   Screening for colon cancer 02/25/2017   Ventral hernia without obstruction or gangrene 02/25/2017   Chronic heart failure with preserved ejection fraction ([place]) 09/28/2016   Ischemic cardiomyopathy 09/28/2016   Cough in adult 07/22/2016   Vitamin D deficiency 06/27/2016   Vitamin B deficiency 06/27/2016   Hyperlipidemia LDL goal <70 06/21/2016   Weakness of both lower extremities 06/21/2016   Diabetes mellitus without complication ([place]) 94/70/7615   Essential hypertension 05/20/2016   Hyperlipidemia associated with type 2 diabetes mellitus ([place]) 05/20/2016   Coronary artery disease of native artery of native heart with stable angina pectoris ([place]) 05/20/2016   Scot Jun, PT, DPT, OCS, ATC 01/16/20  11:38 AM    [facility] [address] [place], [place], [address] Phone: [phone]   Fax:  [phone]  Name: Jacob Harrison MRN: [id number] Date of Birth: 1940-09-07

## 2020-01-18 ENCOUNTER — Encounter: Payer: Self-pay | Admitting: Physical Therapy

## 2020-01-18 ENCOUNTER — Other Ambulatory Visit: Payer: Self-pay

## 2020-01-18 ENCOUNTER — Ambulatory Visit (INDEPENDENT_AMBULATORY_CARE_PROVIDER_SITE_OTHER): Payer: Medicare Other | Admitting: Physical Therapy

## 2020-01-18 DIAGNOSIS — M25512 Pain in left shoulder: Secondary | ICD-10-CM | POA: Diagnosis not present

## 2020-01-18 DIAGNOSIS — M25612 Stiffness of left shoulder, not elsewhere classified: Secondary | ICD-10-CM

## 2020-01-18 DIAGNOSIS — R600 Localized edema: Secondary | ICD-10-CM | POA: Diagnosis not present

## 2020-01-18 DIAGNOSIS — M542 Cervicalgia: Secondary | ICD-10-CM | POA: Diagnosis not present

## 2020-01-18 DIAGNOSIS — M6281 Muscle weakness (generalized): Secondary | ICD-10-CM | POA: Diagnosis not present

## 2020-01-18 DIAGNOSIS — G8929 Other chronic pain: Secondary | ICD-10-CM

## 2020-01-18 NOTE — Therapy (Signed)
[facility] Physical Therapy [address] [place], [place], [address] Phone: [phone]   Fax:  [phone]  Physical Therapy Treatment  Patient Details  Name: Jacob Harrison MRN: [id number] Date of Birth: [birth year] Referring Provider (PT): Magnus Sinning, MD   Encounter Date: 01/18/2020   PT End of Session - 01/18/20 1153    Visit Number 8    Number of Visits 16    Date for PT Re-Evaluation 02/22/20    Authorization Type UHC MCR    Progress Note Due on Visit 10    PT Start Time 1100    PT Stop Time 1139    PT Time Calculation (min) 39 min    Activity Tolerance Patient tolerated treatment well    Behavior During Therapy [facility] for tasks assessed/performed           Past Medical History:  Diagnosis Date   (HFpEF) heart failure with preserved ejection fraction ([place])    a. 10/2016 Echo: EF 65-70%, mild LVH, mildly dil LA. RV fxn nl.   Arthritis    BPH (benign prostatic hyperplasia)    Coronary artery disease    a. 2001 CABG x 3 [facility]): LIMA->LAD, VG->LCX, VG->RPDA; b. 04/05/2010 Cath ([place]): LM 32m, LAD 177m, LCX 39m, RCA 100p, LIMA->LAD atretic, VG->LCX 100, VG->RCA 80ost, 70d; c. 04/2010 Redo CABG x 2 ([place]): VG->LAD, VG->RPDA; d. 10/2012 Cath Sharlene [facility]): LM 20-30, LAD 100/95-108m, LCX 40-42m, RCA 100p, 80-100d, RPDA 99, RPL1 95, VG->LAD ok, VG->RPDA ok, EF 67%; e. 11/2016 MV: EF 48%, No ischemia.   Diabetes mellitus without complication ([place])    Type II   Hyperlipidemia    Hypertension    Scoliosis    Spinal stenosis     Past Surgical History:  Procedure Laterality Date   CARDIAC CATHETERIZATION     CORONARY ARTERY BYPASS GRAFT     x 2 (2001 and redo in late 2011 or early 2012)   HERNIA REPAIR Left    inguinal   LUMBAR LAMINECTOMY/DECOMPRESSION MICRODISCECTOMY N/A 10/20/2018   Procedure: L3-4, L4-5 LUMBAR DECOMPRESSION with dural repair.;  Surgeon: Marybelle Killings, MD;  Location: [facility];  Service: Orthopedics;  Laterality:  N/A;   TONSILLECTOMY AND ADENOIDECTOMY     TRANSURETHRAL RESECTION OF PROSTATE  1990    There were no vitals filed for this visit.   Subjective Assessment - 01/18/20 1113    Subjective relays his shoulder feels good, denies pain.    Pertinent History PMH: DM, heart failure,CAD,HTN,Scoliosis, spinal stenosis and spondylolysis, lumbar decompression surgery 2020.    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, Cervicalgia    Referring Provider (PT) Magnus Sinning, MD                         [facility] Adult PT Treatment/Exercise - 01/18/20 0001      Shoulder Exercises: Standing   Theraband Level (Shoulder External Rotation) Level 3 (Green)    External Rotation Limitations Lt 2 x 15    Theraband Level (Shoulder  Internal Rotation) Level 3 (Green)    Internal Rotation Limitations Lt 2X15    Flexion Both    Shoulder Flexion Weight (lbs) 2    Flexion Limitations 3X10    ABduction Both    Shoulder ABduction Weight (lbs) 2    ABduction Limitations 3X10    Theraband Level (Shoulder Extension) Level 4 (Blue)    Extension Limitations both 2X15    Theraband Level (Shoulder Row) Level 4 (Blue)    Row Limitations both 2X15      Shoulder Exercises: Pulleys   Flexion Limitations 2 min holding 10 sec    ABduction Limitations 2 min holding 5 sec      Shoulder Exercises: ROM/Strengthening   UBE (Upper Arm Bike) L3 3 min fwd, 3 min retro    Ranger --    Wall Wash --      Shoulder Exercises: Stretch   Other Shoulder Stretches doorway stetch 20 sec X 3 times      Manual  Therapy   Manual therapy comments Lt shoulder PROM gentle with emphasis on flexion and abd, gentle grade 1 inferior glides                    PT Short Term Goals - 01/18/20 1155      PT SHORT TERM GOAL #1   Title Pt will be I and compliant with initial HEP.    Time 4    Period Weeks    Status Achieved    Target Date 01/25/20             PT Long Term Goals - 01/18/20 1155      PT LONG TERM GOAL #1   Title Pt will be I and compliant with final HEP.    Time 8    Period Weeks    Status New      PT LONG TERM GOAL #2   Title Pt will improve Lt shoulder AROM to [facility] >150 for flexion and abd    Time 8    Period Weeks    Status New      PT LONG TERM GOAL #3   Title Pt will improve Lt shoulder strength to at least 4+/5 to improve function.    Time 8    Period Weeks    Status New      PT LONG TERM GOAL #4   Title Pt will be able to perform usual ADLS including driving, UE dressing, reaching for remote all with 3/10 pain or less.    Time 8    Period Weeks                 Plan - 01/18/20 1154    Clinical Impression Statement He is doing well with PT and not having much pain, he does still lack strength and mild ROM deficits.    Personal Factors and Comorbidities Comorbidity 3+    Comorbidities PMH: DM, heart failure,CAD,HTN,Scoliosis, spinal stenosis and spondylolysis, lumbar decompression surgery 2020.    Examination-Participation Restrictions Cleaning;Driving;Yard Work    Merchant navy officer Evolving/Moderate complexity    Rehab Potential Good    PT Frequency 2x / week    PT Duration 8 weeks    PT Treatment/Interventions ADLs/Self Care Home Management;Electrical Stimulation;Iontophoresis 4mg /ml Dexamethasone;Moist Heat;Ultrasound;Traction;Neuromuscular re-education;Therapeutic exercise;Therapeutic activities;Manual techniques;Passive range of motion;Dry needling;Joint Manipulations;Taping;Vasopneumatic Device    PT Next Visit Plan Continue  strengthening progression, endurance training.    PT Home Exercise Plan Access Code: [email or web address]    [email or web address]  and Agree with Plan of Care Patient           Patient will benefit from skilled therapeutic intervention in order to improve the following deficits and impairments:  Decreased activity tolerance, Decreased mobility, Decreased range of motion, Decreased strength, Increased edema, Hypomobility, Increased fascial restricitons, Increased muscle spasms, Impaired flexibility, Postural dysfunction, Pain  Visit Diagnosis: Chronic left shoulder pain  Stiffness of left shoulder, not elsewhere classified  Muscle weakness (generalized)  Cervicalgia  Localized edema     Problem List Patient Active Problem List   Diagnosis Date Noted   Rotator cuff disorder 08/09/2019   Sinusitis 04/27/2019   BPH associated with nocturia 11/29/2018   Status post lumbar spine surgery for decompression of spinal cord 10/29/2018   Encounter for Medicare annual wellness exam 08/17/2018   Spinal stenosis of lumbar region 08/02/2018   Flu-like symptoms 05/05/2018   Bacterial conjunctivitis of both eyes 04/27/2018   Atypical mole 07/30/2017   Healthcare maintenance 07/30/2017   Wheezing 07/21/2017   Acute bronchitis 07/21/2017   Neck pain 06/08/2017   Sinus bradycardia 03/18/2017   Screening for colon cancer 02/25/2017   Ventral hernia without obstruction or gangrene 02/25/2017   Chronic heart failure with preserved ejection fraction ([place]) 09/28/2016   Ischemic cardiomyopathy 09/28/2016   Cough in adult 07/22/2016   Vitamin D deficiency 06/27/2016   Vitamin B deficiency 06/27/2016   Hyperlipidemia LDL goal <70 06/21/2016   Weakness of both lower extremities 06/21/2016   Diabetes mellitus without complication ([place]) 56/38/7564   Essential hypertension 05/20/2016   Hyperlipidemia associated with type 2 diabetes mellitus ([place]) 05/20/2016   Coronary artery disease of native  artery of native heart with stable angina pectoris ([place]) 05/20/2016    Silvestre Mesi 01/18/2020, 12:02 PM  [facility] Physical Therapy [address] [place], [place], [address] Phone: [phone]   Fax:  [phone]  Name: Jacob Harrison MRN: [id number] Date of Birth: 1940/04/19

## 2020-01-23 ENCOUNTER — Other Ambulatory Visit: Payer: Self-pay

## 2020-01-23 ENCOUNTER — Ambulatory Visit (INDEPENDENT_AMBULATORY_CARE_PROVIDER_SITE_OTHER): Payer: Medicare Other | Admitting: Physical Therapy

## 2020-01-23 DIAGNOSIS — M6281 Muscle weakness (generalized): Secondary | ICD-10-CM | POA: Diagnosis not present

## 2020-01-23 DIAGNOSIS — M25612 Stiffness of left shoulder, not elsewhere classified: Secondary | ICD-10-CM

## 2020-01-23 DIAGNOSIS — M542 Cervicalgia: Secondary | ICD-10-CM | POA: Diagnosis not present

## 2020-01-23 DIAGNOSIS — R600 Localized edema: Secondary | ICD-10-CM

## 2020-01-23 DIAGNOSIS — M25512 Pain in left shoulder: Secondary | ICD-10-CM

## 2020-01-23 DIAGNOSIS — G8929 Other chronic pain: Secondary | ICD-10-CM

## 2020-01-23 NOTE — Therapy (Signed)
[facility] [address] [place], [place], [address] Phone: [phone]   Fax:  [phone]  Physical Therapy Treatment/Progress note Progress Note reporting period 12/28/19 to 01/23/20  See below for objective and subjective measurements relating to patients progress with PT.   Patient Details  Name: Jacob Harrison MRN: [id number] Date of Birth: September 20, 1940 Referring Provider (PT): Magnus Sinning, MD   Encounter Date: 01/23/2020   PT End of Session - 01/23/20 1157    Visit Number 9    Number of Visits 16    Date for PT Re-Evaluation 02/22/20    Authorization Type UHC MCR    Progress Note Due on Visit 19    PT Start Time 1135    PT Stop Time 1215    PT Time Calculation (min) 40 min    Activity Tolerance Patient tolerated treatment well    Behavior During Therapy [facility] for tasks assessed/performed           Past Medical History:  Diagnosis Date  . (HFpEF) heart failure with preserved ejection fraction ([place])    a. 10/2016 Echo: EF 65-70%, mild LVH, mildly dil LA. RV fxn nl.  . Arthritis   . BPH (benign prostatic hyperplasia)   . Coronary artery disease    a. 2001 CABG x 3 [facility]): LIMA->LAD, VG->LCX, VG->RPDA; b. 04/05/2010 Cath ([facility]): LM 76m LAD 1071mLCX 4079mCA 100p, LIMA->LAD atretic, VG->LCX 100, VG->RCA 80ost, 70d; c. 04/2010 Redo CABG x 2 ([facility]): VG->LAD, VG->RPDA; d. 10/2012 Cath (FrSharlene MottsLM 20-30, LAD 100/95-30m84mX 40-45m,70m 100p, 80-100d, RPDA 99, RPL1 95, VG->LAD ok, VG->RPDA ok, EF 67%; e. 11/2016 MV: EF 48%, No ischemia.  . 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 Hyperlipidemia   . Hypertension   . Scoliosis   . Spinal stenosis     Past Surgical History:  Procedure Laterality Date  . CARDIAC CATHETERIZATION    . CORONARY ARTERY BYPASS GRAFT     x 2 (2001 and redo in late 2011 or early 2012)  . HERNIA REPAIR Left    inguinal  . LUMBAR LAMINECTOMY/DECOMPRESSION MICRODISCECTOMY  N/A 10/20/2018   Procedure: L3-4, L4-5 LUMBAR DECOMPRESSION with dural repair.;  Surgeon: YatesMarybelle Killings  [facility] Orthopedics;  Laterality: N/A;  . TONSILLECTOMY AND ADENOIDECTOMY    . TRANSURETHRAL RESECTION OF PROSTATE  1990    There were no vitals filed for this visit.   Subjective Assessment - 01/23/20 1138    Subjective relays his shoulder feels good but his neck is stiff and tight today. He feels he will be ready for discharge net visit as he feels he is 85 to110% back to normal since starting PT    Pertinent History PMH: DM, heart failure,CAD,HTN,Scoliosis, spinal stenosis and spondylolysis, lumbar decompression surgery 2020.    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, Cervicalgia    Referring Provider (PT) NewtoMagnus Sinning     AROM   Left Shoulder Flexion 153  Degrees    Left Shoulder ABduction 145 Degrees    Cervical Flexion 75%    Cervical Extension 75%    Cervical - Right Side Bend 25%    Cervical - Left Side Bend 50%    Cervical - Right Rotation 75%    Cervical - Left Rotation 75%      Strength   Left Shoulder Flexion 5/5    Left Shoulder ABduction 4+/5    Left Shoulder Internal Rotation 5/5    Left Shoulder External Rotation 5/5                         [facility] Adult PT Treatment/Exercise - 01/23/20 0001      Shoulder Exercises: Standing   Theraband Level (Shoulder External Rotation) Level 3 (Green)    External Rotation Limitations Lt 2 x 15    Flexion Both    Shoulder Flexion Weight (lbs) 3    Flexion Limitations  3X10    Shoulder ABduction Weight (lbs) 2    ABduction Limitations 3X10    Theraband Level (Shoulder Extension) Level 4 (Blue)    Extension Limitations both 2X15    Theraband Level (Shoulder Row) Level 4 (Blue)    Row Limitations both 2X15      Shoulder Exercises: Pulleys   Flexion 3 minutes    ABduction 3 minutes      Manual Therapy   Manual therapy comments Lt shoulder PROM and more time spent on cervical PROM for rotation and sidebend with latreral glides into sidebending                    PT Short Term Goals - 01/23/20 1203      PT SHORT TERM GOAL #1   Title Pt will be I and compliant with initial HEP.    Time 4    Period Weeks    Status Achieved    Target Date 01/25/20             PT Long Term Goals - 01/23/20 1203      PT LONG TERM GOAL #1   Title Pt will be I and compliant with final HEP.    Baseline compliant with current but may need updated progression    Time 8    Period Weeks    Status Achieved      PT LONG TERM GOAL #2   Title Pt will improve Lt shoulder AROM to [facility] >150 for flexion and abd    Baseline met for flexion, now up to 145 for abd    Time 8    Period Weeks    Status On-going      PT LONG TERM GOAL #3   Title Pt will improve Lt shoulder strength to at least 4+/5 to improve function.    Baseline now met 10/18    Time 8    Period Weeks    Status Achieved      PT LONG TERM GOAL #4   Title Pt will be able to perform usual ADLS including driving, UE dressing, reaching for remote all with 3/10 pain or less.    Time 8    Period Weeks    Status Achieved                 Plan - 01/23/20 1258    Clinical Impression Statement He has done great with PT, anticipate he will meet all of his goals by next visit and will  plan to discharge.    Personal Factors and Comorbidities Comorbidity 3+    Comorbidities PMH: DM, heart failure,CAD,HTN,Scoliosis, spinal stenosis and spondylolysis, lumbar decompression surgery 2020.     Examination-Participation Restrictions Cleaning;Driving;Yard Work    [employer] Evolving/Moderate complexity    Rehab Potential Good    PT Frequency 2x / week    PT Duration 8 weeks    PT Treatment/Interventions ADLs/Self Care Home Management;Electrical Stimulation;Iontophoresis 52m/ml Dexamethasone;Moist Heat;Ultrasound;Traction;Neuromuscular re-education;Therapeutic exercise;Therapeutic activities;Manual techniques;Passive range of motion;Dry needling;Joint Manipulations;Taping;Vasopneumatic Device    PT Next Visit Plan DC    PT Home Exercise Plan Access Code: [email or web address]   Consulted and Agree with Plan of Care Patient           Patient will benefit from skilled therapeutic intervention in order to improve the following deficits and impairments:  Decreased activity tolerance, Decreased mobility, Decreased range of motion, Decreased strength, Increased edema, Hypomobility, Increased fascial restricitons, Increased muscle spasms, Impaired flexibility, Postural dysfunction, Pain  Visit Diagnosis: Chronic left shoulder pain  Stiffness of left shoulder, not elsewhere classified  Muscle weakness (generalized)  Cervicalgia  Localized edema     Problem List Patient Active Problem List   Diagnosis Date Noted  . Rotator cuff disorder 08/09/2019  . Sinusitis 04/27/2019  . BPH associated with nocturia 11/29/2018  . 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 Encounter for Medicare annual wellness exam 08/17/2018  . Spinal stenosis of lumbar region 08/02/2018  . Flu-like symptoms 05/05/2018  . Bacterial conjunctivitis of both eyes 04/27/2018  . Atypical mole 07/30/2017  . Healthcare maintenance 07/30/2017  . Wheezing 07/21/2017  . Acute bronchitis 07/21/2017  . Neck pain 06/08/2017  . Sinus bradycardia 03/18/2017  . Screening for colon cancer 02/25/2017  . Ventral hernia without obstruction or gangrene 02/25/2017  .  Chronic heart failure with preserved ejection fraction ([place] 09/28/2016  . Ischemic cardiomyopathy 09/28/2016  . Cough in adult 07/22/2016  . Vitamin D deficiency 06/27/2016  . Vitamin B deficiency 06/27/2016  . Hyperlipidemia LDL goal <70 06/21/2016  . Weakness of both lower extremities 06/21/2016  . Diabetes mellitus without complication ([place] 013/88/7195 . Essential hypertension 05/20/2016  . Hyperlipidemia associated with type 2 diabetes mellitus ([place] 05/20/2016  . Coronary artery disease of native artery of native heart with stable angina pectoris ([place] 05/20/2016    BSilvestre Mesi10/18/2021, 1:00 PM  [facility] [address] [address]Phone: [phone]  Fax:  [phone] Name: Jacob BognarMRN: [id number] of Birth: 122-Apr-1942

## 2020-01-25 ENCOUNTER — Ambulatory Visit (INDEPENDENT_AMBULATORY_CARE_PROVIDER_SITE_OTHER): Payer: Medicare Other | Admitting: Physical Therapy

## 2020-01-25 ENCOUNTER — Other Ambulatory Visit: Payer: Self-pay

## 2020-01-25 DIAGNOSIS — M6281 Muscle weakness (generalized): Secondary | ICD-10-CM

## 2020-01-25 DIAGNOSIS — G8929 Other chronic pain: Secondary | ICD-10-CM

## 2020-01-25 DIAGNOSIS — M542 Cervicalgia: Secondary | ICD-10-CM | POA: Diagnosis not present

## 2020-01-25 DIAGNOSIS — M25512 Pain in left shoulder: Secondary | ICD-10-CM | POA: Diagnosis not present

## 2020-01-25 DIAGNOSIS — R600 Localized edema: Secondary | ICD-10-CM | POA: Diagnosis not present

## 2020-01-25 DIAGNOSIS — M25612 Stiffness of left shoulder, not elsewhere classified: Secondary | ICD-10-CM | POA: Diagnosis not present

## 2020-01-25 NOTE — Therapy (Signed)
[facility] Physical Therapy [address] [place], [place], [address] Phone: [phone]   Fax:  [phone]  Physical Therapy Treatment/DIscharge PHYSICAL THERAPY DISCHARGE SUMMARY  Visits from Start of Care: 10  Current functional level related to goals / functional outcomes: See below   Remaining deficits: See below   Education / Equipment: HEP Plan: Patient agrees to discharge.  Patient goals were met. Patient is being discharged due to meeting the stated rehab goals.  ?????       Patient Details  Name: Jacob Harrison MRN: [id number] Date of Birth: 21-Jun-1940 Referring Provider (PT): Tyrell Antonio, MD   Encounter Date: 01/25/2020   PT End of Session - 01/25/20 1132    Visit Number 10    Number of Visits 16    Date for PT Re-Evaluation 02/22/20    Authorization Type UHC MCR    Progress Note Due on Visit 19    PT Start Time 1055    PT Stop Time 1137    PT Time Calculation (min) 42 min    Activity Tolerance Patient tolerated treatment well    Behavior During Therapy [facility] for tasks assessed/performed           Past Medical History:  Diagnosis Date   (HFpEF) heart failure with preserved ejection fraction (HCC)    a. 10/2016 Echo: EF 65-70%, mild LVH, mildly dil LA. RV fxn nl.   Arthritis    BPH (benign prostatic hyperplasia)    Coronary artery disease    a. 2001 CABG x 3 [facility]): LIMA->LAD, VG->LCX, VG->RPDA; b. 04/05/2010 Cath ([facility]): LM 22m, LAD 117m, LCX 46m, RCA 100p, LIMA->LAD atretic, VG->LCX 100, VG->RCA 80ost, 70d; c. 04/2010 Redo CABG x 2 ([facility]): VG->LAD, VG->RPDA; d. 10/2012 Cath [facility]): LM 20-30, LAD 100/95-32m, LCX 40-64m, RCA 100p, 80-100d, RPDA 99, RPL1 95, VG->LAD ok, VG->RPDA ok, EF 67%; e. 11/2016 MV: EF 48%, No ischemia.   Diabetes mellitus without complication (HCC)    Type II   Hyperlipidemia    Hypertension    Scoliosis    Spinal stenosis     Past Surgical History:  Procedure Laterality  Date   CARDIAC CATHETERIZATION     CORONARY ARTERY BYPASS GRAFT     x 2 (2001 and redo in late 2011 or early 2012)   HERNIA REPAIR Left    inguinal   LUMBAR LAMINECTOMY/DECOMPRESSION MICRODISCECTOMY N/A 10/20/2018   Procedure: L3-4, L4-5 LUMBAR DECOMPRESSION with dural repair.;  Surgeon: [name], MD;  Location: [facility];  Service: Orthopedics;  Laterality: N/A;   TONSILLECTOMY AND ADENOIDECTOMY     TRANSURETHRAL RESECTION OF PROSTATE  1990    There were no vitals filed for this visit.   Subjective Assessment - 01/25/20 1127    Subjective relays he feels ready for discharge. No questions or concerns    Pertinent History PMH: DM, heart failure,CAD,HTN,Scoliosis, spinal stenosis and spondylolysis, lumbar decompression surgery 2020.    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, Cervicalgia    Referring Provider (PT) Tyrell Antonio,  MD      AROM   Left Shoulder Flexion 153 Degrees    Left Shoulder ABduction 150 Degrees    Cervical Flexion 75%    Cervical Extension 75%    Cervical - Right Side Bend 25%    Cervical - Left Side Bend 50%    Cervical - Right Rotation 75%    Cervical - Left Rotation 75%      Strength   Left Shoulder Flexion 5/5    Left Shoulder ABduction 4+/5    Left Shoulder Internal Rotation 5/5    Left Shoulder External Rotation 5/5                         [facility] Adult PT Treatment/Exercise - 01/25/20 0001      Shoulder Exercises: Standing   Horizontal ABduction Both    Theraband Level (Shoulder Horizontal ABduction) Level 2 (Red)     Horizontal ABduction Limitations 2X15    Theraband Level (Shoulder External Rotation) Level 3 (Green)    External Rotation Limitations bilat 2 x 15    Flexion Both    Shoulder Flexion Weight (lbs) 3    Flexion Limitations 3X10    Shoulder ABduction Weight (lbs) 2    ABduction Limitations 3X10    Theraband Level (Shoulder Extension) Level 4 (Blue)    Extension Limitations both 2X15    Theraband Level (Shoulder Row) Level 4 (Blue)    Row Limitations both 2X15      Shoulder Exercises: Pulleys   Flexion 2 minutes    ABduction 2 minutes      Shoulder Exercises: ROM/Strengthening   UBE (Upper Arm Bike) L3 3 min fwd, 3 min retro    Wall Wash wall ladder X 10 reps flexion and 10 reps abd, holding 5 sec                    PT Short Term Goals - 01/23/20 1203      PT SHORT TERM GOAL #1   Title Pt will be I and compliant with initial HEP.    Time 4    Period Weeks    Status Achieved    Target Date 01/25/20             PT Long Term Goals - 01/23/20 1203      PT LONG TERM GOAL #1   Title Pt will be I and compliant with final HEP.    Baseline compliant with current but may need updated progression    Time 8    Period Weeks    Status Achieved      PT LONG TERM GOAL #2   Title Pt will improve Lt shoulder AROM to [facility] >150 for flexion and abd    Baseline met for flexion, now up to 145 for abd    Time 8    Period Weeks    Status On-going      PT LONG TERM GOAL #3   Title Pt will improve Lt shoulder strength to at least 4+/5 to improve function.    Baseline now met 10/18    Time 8    Period Weeks    Status Achieved      PT LONG TERM GOAL #4   Title Pt will be able to perform usual ADLS including driving, UE dressing, reaching for remote all with 3/10 pain or less.    Time 8    Period Weeks    Status Achieved  Plan - 01/25/20 1134    Clinical Impression Statement He has met all PT goals now and is pleased with his functional level. HEP  was progressed and reviewed with him. He will be discharged today.    Personal Factors and Comorbidities Comorbidity 3+    Comorbidities PMH: DM, heart failure,CAD,HTN,Scoliosis, spinal stenosis and spondylolysis, lumbar decompression surgery 2020.    Examination-Participation Restrictions Cleaning;Driving;Yard Work    [employer] officer Evolving/Moderate complexity    Rehab Potential Good    PT Frequency 2x / week    PT Duration 8 weeks    PT Treatment/Interventions ADLs/Self Care Home Management;Electrical Stimulation;Iontophoresis 86m/ml Dexamethasone;Moist Heat;Ultrasound;Traction;Neuromuscular re-education;Therapeutic exercise;Therapeutic activities;Manual techniques;Passive range of motion;Dry needling;Joint Manipulations;Taping;Vasopneumatic Device    PT Next Visit Plan DC today    PT Home Exercise Plan Access Code: [email or web address]   Consulted and Agree with Plan of Care Patient           Patient will benefit from skilled therapeutic intervention in order to improve the following deficits and impairments:  Decreased activity tolerance, Decreased mobility, Decreased range of motion, Decreased strength, Increased edema, Hypomobility, Increased fascial restricitons, Increased muscle spasms, Impaired flexibility, Postural dysfunction, Pain  Visit Diagnosis: Chronic left shoulder pain  Stiffness of left shoulder, not elsewhere classified  Muscle weakness (generalized)  Cervicalgia  Localized edema     Problem List Patient Active Problem List   Diagnosis Date Noted   Rotator cuff disorder 08/09/2019   Sinusitis 04/27/2019   BPH associated with nocturia 11/29/2018   Status post lumbar spine surgery for decompression of spinal cord 10/29/2018   Encounter for Medicare annual wellness exam 08/17/2018   Spinal stenosis of lumbar region 08/02/2018   Flu-like symptoms 05/05/2018   Bacterial conjunctivitis of both eyes 04/27/2018   Atypical mole 07/30/2017    Healthcare maintenance 07/30/2017   Wheezing 07/21/2017   Acute bronchitis 07/21/2017   Neck pain 06/08/2017   Sinus bradycardia 03/18/2017   Screening for colon cancer 02/25/2017   Ventral hernia without obstruction or gangrene 02/25/2017   Chronic heart failure with preserved ejection fraction (HColumb[place] AFB 09/28/2016   Ischemic cardiomyopathy 09/28/2016   Cough in adult 07/22/2016   Vitamin D deficiency 06/27/2016   Vitamin B deficiency 06/27/2016   Hyperlipidemia LDL goal <70 06/21/2016   Weakness of both lower extremities 06/21/2016   Diabetes mellitus without complication (HNap[place] 029/51/8841  Essential hypertension 05/20/2016   Hyperlipidemia associated with type 2 diabetes mellitus ([place] 05/20/2016   Coronary artery disease of native artery of native heart with stable angina pectoris (HCharl[place] 05/20/2016    BDebbe Odea PT,DPT 01/25/2020, 11:41 AM  [facility] [address] [address]Phone: [phone]  Fax:  [phone] Name: RLeyland KennaMRN: [id number] of Birth: 1May 19, 1942

## 2020-01-25 NOTE — Patient Instructions (Signed)
Access Code: [email or web address] URL: [email or web address] Date: 01/25/2020 Prepared by: Elsie Ra  Exercises Standing Shoulder Flexion Wall Walk - 2 x daily - 6 x weekly - 1 sets - 10-15 reps - 5 sec hold Seated Cervical Sidebending Stretch - 2 x daily - 6 x weekly - 2-3 reps - 1 sets - 30 sec hold Standing Row with Anchored Resistance - 2 x daily - 6 x weekly - 10-20 reps - 2-3 sets Shoulder extension with resistance - Neutral - 2 x daily - 6 x weekly - 10 reps - 2-3 sets Shoulder External Rotation and Scapular Retraction with Resistance - 2 x daily - 6 x weekly - 3 sets - 10 reps Standing Shoulder Horizontal Abduction with Resistance - 2 x daily - 6 x weekly - 3 sets - 10 reps

## 2020-02-14 ENCOUNTER — Other Ambulatory Visit: Payer: Self-pay | Admitting: Internal Medicine

## 2020-02-14 NOTE — Telephone Encounter (Signed)
Rx request sent to pharmacy.  

## 2020-02-24 ENCOUNTER — Other Ambulatory Visit: Payer: Self-pay | Admitting: Physician Assistant

## 2020-02-27 ENCOUNTER — Other Ambulatory Visit: Payer: Self-pay | Admitting: Physician Assistant

## 2020-02-27 DIAGNOSIS — N401 Enlarged prostate with lower urinary tract symptoms: Secondary | ICD-10-CM

## 2020-02-29 ENCOUNTER — Other Ambulatory Visit: Payer: Self-pay | Admitting: Internal Medicine

## 2020-03-04 ENCOUNTER — Other Ambulatory Visit: Payer: Self-pay | Admitting: Physician Assistant

## 2020-04-15 ENCOUNTER — Other Ambulatory Visit: Payer: Self-pay | Admitting: Physician Assistant

## 2020-04-15 DIAGNOSIS — Z0000 Encounter for general adult medical examination without abnormal findings: Secondary | ICD-10-CM

## 2020-04-15 DIAGNOSIS — I10 Essential (primary) hypertension: Secondary | ICD-10-CM

## 2020-04-15 DIAGNOSIS — E118 Type 2 diabetes mellitus with unspecified complications: Secondary | ICD-10-CM

## 2020-04-15 DIAGNOSIS — E1169 Type 2 diabetes mellitus with other specified complication: Secondary | ICD-10-CM

## 2020-04-16 ENCOUNTER — Other Ambulatory Visit: Payer: Medicare Other

## 2020-04-16 ENCOUNTER — Other Ambulatory Visit: Payer: Self-pay

## 2020-04-16 DIAGNOSIS — E118 Type 2 diabetes mellitus with unspecified complications: Secondary | ICD-10-CM

## 2020-04-16 DIAGNOSIS — E1169 Type 2 diabetes mellitus with other specified complication: Secondary | ICD-10-CM | POA: Diagnosis not present

## 2020-04-16 DIAGNOSIS — Z0000 Encounter for general adult medical examination without abnormal findings: Secondary | ICD-10-CM | POA: Diagnosis not present

## 2020-04-16 DIAGNOSIS — E785 Hyperlipidemia, unspecified: Secondary | ICD-10-CM

## 2020-04-16 DIAGNOSIS — I10 Essential (primary) hypertension: Secondary | ICD-10-CM

## 2020-04-17 LAB — COMPREHENSIVE METABOLIC PANEL
ALT: 29 IU/L (ref 0–44)
AST: 26 IU/L (ref 0–40)
Albumin/Globulin Ratio: 2 (ref 1.2–2.2)
Albumin: 4.6 g/dL (ref 3.7–4.7)
Alkaline Phosphatase: 94 IU/L (ref 44–121)
BUN/Creatinine Ratio: 13 (ref 10–24)
BUN: 10 mg/dL (ref 8–27)
Bilirubin Total: 0.5 mg/dL (ref 0.0–1.2)
CO2: 22 mmol/L (ref 20–29)
Calcium: 9.7 mg/dL (ref 8.6–10.2)
Chloride: 100 mmol/L (ref 96–106)
Creatinine, Ser: 0.77 mg/dL (ref 0.76–1.27)
GFR calc Af Amer: 101 mL/min/{1.73_m2} (ref >59)
GFR calc non Af Amer: 88 mL/min/{1.73_m2} (ref >59)
Globulin, Total: 2.3 g/dL (ref 1.5–4.5)
Glucose: 110 mg/dL — ABNORMAL HIGH (ref 65–99)
Potassium: 4.4 mmol/L (ref 3.5–5.2)
Sodium: 137 mmol/L (ref 134–144)
Total Protein: 6.9 g/dL (ref 6.0–8.5)

## 2020-04-17 LAB — MICROALBUMIN / CREATININE URINE RATIO
Creatinine, Urine: 19.3 mg/dL
Microalb/Creat Ratio: 35 mg/g creat — ABNORMAL HIGH (ref 0–29)
Microalbumin, Urine: 6.7 ug/mL

## 2020-04-17 LAB — CBC
Hematocrit: 40.6 % (ref 37.5–51.0)
Hemoglobin: 14 g/dL (ref 13.0–17.7)
MCH: 32.6 pg (ref 26.6–33.0)
MCHC: 34.5 g/dL (ref 31.5–35.7)
MCV: 94 fL (ref 79–97)
Platelets: 241 x10E3/uL (ref 150–450)
RBC: 4.3 x10E6/uL (ref 4.14–5.80)
RDW: 13.1 % (ref 11.6–15.4)
WBC: 11.5 x10E3/uL — ABNORMAL HIGH (ref 3.4–10.8)

## 2020-04-17 LAB — LIPID PANEL
Chol/HDL Ratio: 2.3 ratio (ref 0.0–5.0)
Cholesterol, Total: 160 mg/dL (ref 100–199)
HDL: 69 mg/dL (ref >39)
LDL Chol Calc (NIH): 69 mg/dL (ref 0–99)
Triglycerides: 129 mg/dL (ref 0–149)
VLDL Cholesterol Cal: 22 mg/dL (ref 5–40)

## 2020-04-17 LAB — TSH: TSH: 2.02 u[IU]/mL (ref 0.450–4.500)

## 2020-04-17 LAB — HEMOGLOBIN A1C
Est. average glucose Bld gHb Est-mCnc: 128 mg/dL
Hgb A1c MFr Bld: 6.1 % — ABNORMAL HIGH (ref 4.8–5.6)

## 2020-04-18 ENCOUNTER — Other Ambulatory Visit: Payer: Self-pay

## 2020-04-18 ENCOUNTER — Ambulatory Visit (INDEPENDENT_AMBULATORY_CARE_PROVIDER_SITE_OTHER): Payer: Medicare Other | Admitting: Physician Assistant

## 2020-04-18 ENCOUNTER — Encounter: Payer: Self-pay | Admitting: Physician Assistant

## 2020-04-18 VITALS — Ht 67.0 in | Wt 200.0 lb

## 2020-04-18 DIAGNOSIS — D72829 Elevated white blood cell count, unspecified: Secondary | ICD-10-CM

## 2020-04-18 DIAGNOSIS — E118 Type 2 diabetes mellitus with unspecified complications: Secondary | ICD-10-CM

## 2020-04-18 DIAGNOSIS — Z0000 Encounter for general adult medical examination without abnormal findings: Secondary | ICD-10-CM

## 2020-04-18 NOTE — Progress Notes (Signed)
Virtual Visit via Telephone Note:  I connected with Jacob Harrison by telephone and verified that I am speaking with the correct person using two identifiers.    I discussed the limitations, risks, security and privacy concerns for performing an evaluation and management service by telephone and the availability of in person appointments. The staff discussed with patient that there may be a patient responsible charge related to this service. The patient expressed understanding and agreed to proceed.   Location of Patient- Home Location of Provider- Office    Subjective:   Jacob Harrison is a 80 y.o. male who presents for Medicare Annual/Subsequent preventive examination.  Review of Systems     General:   No F/C, wt loss Pulm:   No DIB, pleuritic chest pain Card:  No CP, palpitations Abd:  No n/v/d or pain Ext:  No inc edema from baseline     Objective:    Today's Vitals   04/18/20 1038  Weight: 200 lb (90.7 kg)  Height: 5\' 7"  (1.702 m)   Body mass index is 31.32 kg/m.  Advanced Directives 10/20/2018 10/20/2018 10/13/2018 05/20/2016  Does Patient Have a Medical Advance Directive? Yes Yes Yes Yes  Type of Advance Directive - [employer];Living will Living will;Healthcare Power of Attorney Living will  Does patient want to make changes to medical advance directive? No - Patient declined - No - Patient declined -  Copy of [place] in Chart? - No - copy requested No - copy requested -    Current Medications (verified) Outpatient Encounter Medications as of 04/18/2020  Medication Sig  . amLODipine (NORVASC) 5 MG tablet TAKE 1 TABLET BY MOUTH EVERY DAY  . aspirin EC 81 MG tablet Take 1 tablet (81 mg total) by mouth daily.  . carvedilol (COREG) 3.125 MG tablet TAKE 1 TABLET (3.125 MG TOTAL) BY MOUTH 2 (TWO) TIMES DAILY WITH A MEAL.  Marland Kitchen Cholecalciferol (VITAMIN D-3 PO) Take 800 Units by mouth daily.  . Coenzyme Q10 (CO Q-10) 200 MG CAPS  Take 200 mg by mouth daily.   . cyanocobalamin 500 MCG tablet Take 500 mcg by mouth daily.  . diclofenac Sodium (VOLTAREN) 1 % GEL Apply 2 g topically 2 (two) times daily as needed.  . ezetimibe (ZETIA) 10 MG tablet TAKE 1 TABLET BY MOUTH EVERY DAY  . finasteride (PROSCAR) 5 MG tablet TAKE 1 TABLET BY MOUTH EVERY DAY  . furosemide (LASIX) 20 MG tablet Take 1 tablet (20 mg total) by mouth daily.  . Glucosamine-Chondroit-Vit C-Mn (GLUCOSAMINE 1500 COMPLEX) CAPS Take 1 capsule by mouth daily.  . Lancets (ONETOUCH ULTRASOFT) lancets Use to check blood sugars fasting daily  . metFORMIN (GLUCOPHAGE) 500 MG tablet 1/2 tablet by mouth daily with food  . Multiple Vitamins-Minerals (PRESERVISION AREDS 2 PO) Take 1 capsule by mouth 2 (two) times a day.  . nitroGLYCERIN (NITROSTAT) 0.4 MG SL tablet Place 1 tablet (0.4 mg total) under the tongue as needed.  . ranolazine (RANEXA) 1000 MG SR tablet Take 1 tablet by mouth twice daily  . rosuvastatin (CRESTOR) 20 MG tablet TAKE 1 TABLET BY MOUTH EVERYDAY AT BEDTIME   Facility-Administered Encounter Medications as of 04/18/2020  Medication  . ipratropium-albuterol (DUONEB) 0.5-2.5 (3) MG/3ML nebulizer solution 3 mL    Allergies (verified) Other, Isosorbide mononitrate [isosorbide dinitrate er], and Tetracyclines & related   History: Past Medical History:  Diagnosis Date  . (HFpEF) heart failure with preserved ejection fraction ([place])    a.  10/2016 Echo: EF 65-70%, mild LVH, mildly dil LA. RV fxn nl.  . Arthritis   . BPH (benign prostatic hyperplasia)   . Coronary artery disease    a. 2001 CABG x 3 [facility]): LIMA->LAD, VG->LCX, VG->RPDA; b. 04/05/2010 Cath ([place]): LM 32m, LAD 131m, LCX 35m, RCA 100p, LIMA->LAD atretic, VG->LCX 100, VG->RCA 80ost, 70d; c. 04/2010 Redo CABG x 2 ([place]): VG->LAD, VG->RPDA; d. 10/2012 Cath [facility]): LM 20-30, LAD 100/95-66m, LCX 40-52m, RCA 100p, 80-100d, RPDA 99, RPL1 95, VG->LAD ok, VG->RPDA ok, EF 67%; e. 11/2016 MV: EF  48%, No ischemia.  . 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 Hyperlipidemia   . Hypertension   . Scoliosis   . Spinal stenosis    Past Surgical History:  Procedure Laterality Date  . CARDIAC CATHETERIZATION    . CORONARY ARTERY BYPASS GRAFT     x 2 (2001 and redo in late 2011 or early 2012)  . HERNIA REPAIR Left    inguinal  . LUMBAR LAMINECTOMY/DECOMPRESSION MICRODISCECTOMY N/A 10/20/2018   Procedure: L3-4, L4-5 LUMBAR DECOMPRESSION with dural repair.;  Surgeon: Marybelle Killings, MD;  Location: [place];  Service: Orthopedics;  Laterality: N/A;  . TONSILLECTOMY AND ADENOIDECTOMY    . TRANSURETHRAL RESECTION OF PROSTATE  1990   Family History  Problem Relation Age of Onset  . Diabetes Mother   . Hyperlipidemia Mother   . Cancer Father        colon  . Alcohol abuse Maternal Uncle   . Diabetes Maternal Uncle   . Hyperlipidemia Maternal Uncle   . Cancer Paternal Uncle        colon   Social History   Socioeconomic History  . Marital status: Married    Spouse name: Not on file  . Number of children: Not on file  . Years of education: Not on file  . Highest education level: Not on file  Occupational History  . Not on file  Tobacco Use  . Smoking status: Never Smoker  . Smokeless tobacco: Never Used  Vaping Use  . Vaping Use: Never used  Substance and Sexual Activity  . Alcohol use: Yes    Alcohol/week: 11.0 standard drinks    Types: 5 Glasses of wine, 6 Cans of beer per week  . Drug use: No  . Sexual activity: Not Currently    Birth control/protection: None  Other Topics Concern  . Not on file  Social History Narrative  . Not on file   Social Determinants of Health   Financial Resource Strain: Not on file  Food Insecurity: Not on file  Transportation Needs: Not on file  Physical Activity: Not on file  Stress: Not on file  Social Connections: Not on file    Tobacco Counseling Counseling given: Not Answered       Diabetic?Yes,  controlled.    Activities of Daily Living In your present state of health, do you have any difficulty performing the following activities: 04/18/2020 12/15/2019  Hearing? N Y  Vision? Y N  Difficulty concentrating or making decisions? N N  Walking or climbing stairs? Y N  Dressing or bathing? N N  Doing errands, shopping? N N  Some recent data might be hidden    Patient Care Team: [name], PA-C as PCP - General (Physician Assistant) End, Harrell Gave, MD as PCP - Cardiology (Cardiology)  Indicate any recent Medical Services you may have received from other than [facility] providers in the past year (date may be  approximate).     Assessment:   This is a routine wellness examination for [place].  Hearing/Vision screen No exam data present  Dietary issues and exercise activities discussed:  -Recommend to follow a heart healthy diet, continue to monitor carbohydrates and glucose, and stay well hydrated. Continue low sodium diet. Continue to stay active with walking.  Goals   None    Depression Screen PHQ 2/9 Scores 04/18/2020 12/15/2019 08/15/2019 05/17/2019 02/22/2019 08/17/2018 05/19/2018  PHQ - 2 Score 0 0 0 0 0 0 0  PHQ- 9 Score 1 2 3 3 3 4 4     Fall Risk Fall Risk  04/18/2020 12/15/2019 08/17/2018 06/08/2017 05/20/2016  Falls in the past year? 0 0 0 No Yes  Number falls in past yr: - - - - 1  Injury with Fall? - - - - No  Follow up Falls evaluation completed Falls evaluation completed Falls evaluation completed - -    FALL RISK PREVENTION PERTAINING TO THE HOME:  Any stairs in or around the home? Yes  If so, are there any without handrails? Yes  Home free of loose throw rugs in walkways, pet beds, electrical cords, etc? Yes  Adequate lighting in your home to reduce risk of falls? Yes   ASSISTIVE DEVICES UTILIZED TO PREVENT FALLS:  Life alert? No  Use of a cane, walker or w/c? No  Grab bars in the bathroom? Yes  Shower chair or bench in shower? Yes  Elevated toilet seat or  a handicapped toilet? No   TIMED UP AND GO:  Was the test performed? No .  Length of time to ambulate 10 feet: 0 sec.   Telehealth  Cognitive Function: wnl     6CIT Screen 04/18/2020 08/17/2018  What Year? 0 points 0 points  What month? 0 points 0 points  What time? 0 points 0 points  Count back from 20 0 points 0 points  Months in reverse 0 points 0 points  Repeat phrase 0 points 0 points  Total Score 0 0    Immunizations Immunization History  Administered Date(s) Administered  . Influenza, High Dose Seasonal PF 02/25/2017, 04/14/2018, 01/06/2019  . Influenza-Unspecified 04/08/2016, 01/06/2019, 04/05/2020  . PFIZER SARS-COV-2 Vaccination 05/11/2019, 06/01/2019  . Pneumococcal Conjugate-13 04/07/2006  . Pneumococcal Polysaccharide-23 11/27/2016  . Td 04/07/2014    TDAP status: Up to date  Flu Vaccine status: Up to date  Pneumococcal vaccine status: Up to date  Covid-19 vaccine status: Completed vaccines  Qualifies for Shingles Vaccine? Yes   Zostavax completed No   Shingrix Completed?: No.    Education has been provided regarding the importance of this vaccine. Patient has been advised to call insurance company to determine out of pocket expense if they have not yet received this vaccine. Advised may also receive vaccine at local pharmacy or Health Dept. Verbalized acceptance and understanding.  Screening Tests Health Maintenance  Topic Date Due  . Hepatitis C Screening  Never done  . COVID-19 Vaccine (3 - Booster for Pfizer series) 11/29/2019  . FOOT EXAM  02/22/2020  . OPHTHALMOLOGY EXAM  05/09/2020  . HEMOGLOBIN A1C  10/14/2020  . URINE MICROALBUMIN  04/16/2021  . TETANUS/TDAP  04/07/2024  . INFLUENZA VACCINE  Completed  . PNA vac Low Risk Adult  Completed    Health Maintenance  Health Maintenance Due  Topic Date Due  . Hepatitis C Screening  Never done  . COVID-19 Vaccine (3 - Booster for Pfizer series) 11/29/2019  . FOOT EXAM  02/22/2020  Colorectal cancer screening: No longer required.   Lung Cancer Screening: (Low Dose CT Chest recommended if Age 76-80 years, 30 pack-year currently smoking OR have quit w/in 15years.) does not qualify.   Lung Cancer Screening Referral:   Additional Screening:  Hepatitis C Screening: does qualify; Completed patient declined  Vision Screening: Recommended annual ophthalmology exams for early detection of glaucoma and other disorders of the eye. Is the patient up to date with their annual eye exam?  Yes  Who is the provider or what is the name of the office in which the patient attends annual eye exams? Dr. Elmer Sow  If pt is not established with a provider, would they like to be referred to a provider to establish care? No  Dental Screening: Recommended annual dental exams for proper oral hygiene  Community Resource Referral / Chronic Care Management: CRR required this visit?  No   CCM required this visit?  No      Plan:   -Continue current medication regimen. -Discussed most recent labs, which are essentially within normal limits or stable from prior. Patient reports no prior work up for elevated WBC. Will continue to monitor and plan to repeat at follow up visit and recommend further evaluation if continues to be elevated. A1c mildly increased likely related to cortisone injection and increased carbohydrate/glucose consumption over the Holidays. -Continue current medication regimen.  -Continue to follow up with Cardiology and other specialists.  -Follow up in 4 months for DM, HTN, elevated WBC  I have personally reviewed and noted the following in the patient's chart:   . Medical and social history . Use of alcohol, tobacco or illicit drugs  . Current medications and supplements . Functional ability and status . Nutritional status . Physical activity . Advanced directives . List of other physicians . Hospitalizations, surgeries, and ER visits in previous 12  months . Vitals . Screenings to include cognitive, depression, and falls . Referrals and appointments  In addition, I have reviewed and discussed with patient certain preventive protocols, quality metrics, and best practice recommendations. A written personalized care plan for preventive services as well as general preventive health recommendations were provided to patient.

## 2020-05-12 ENCOUNTER — Other Ambulatory Visit: Payer: Self-pay | Admitting: Internal Medicine

## 2020-05-15 DIAGNOSIS — H353132 Nonexudative age-related macular degeneration, bilateral, intermediate dry stage: Secondary | ICD-10-CM | POA: Diagnosis not present

## 2020-05-15 DIAGNOSIS — H2513 Age-related nuclear cataract, bilateral: Secondary | ICD-10-CM | POA: Diagnosis not present

## 2020-05-15 DIAGNOSIS — H524 Presbyopia: Secondary | ICD-10-CM | POA: Diagnosis not present

## 2020-05-15 LAB — HM DIABETES EYE EXAM

## 2020-05-22 ENCOUNTER — Other Ambulatory Visit: Payer: Self-pay | Admitting: Physician Assistant

## 2020-05-22 DIAGNOSIS — N401 Enlarged prostate with lower urinary tract symptoms: Secondary | ICD-10-CM

## 2020-05-23 ENCOUNTER — Encounter: Payer: Self-pay | Admitting: Physician Assistant

## 2020-05-29 ENCOUNTER — Other Ambulatory Visit: Payer: Self-pay | Admitting: Physician Assistant

## 2020-05-30 ENCOUNTER — Other Ambulatory Visit: Payer: Self-pay

## 2020-05-30 ENCOUNTER — Ambulatory Visit: Payer: Medicare Other | Admitting: Internal Medicine

## 2020-05-30 ENCOUNTER — Encounter: Payer: Self-pay | Admitting: Internal Medicine

## 2020-05-30 VITALS — BP 132/70 | HR 78 | Ht 67.0 in | Wt 204.0 lb

## 2020-05-30 DIAGNOSIS — I25118 Atherosclerotic heart disease of native coronary artery with other forms of angina pectoris: Secondary | ICD-10-CM

## 2020-05-30 DIAGNOSIS — I10 Essential (primary) hypertension: Secondary | ICD-10-CM | POA: Diagnosis not present

## 2020-05-30 DIAGNOSIS — E785 Hyperlipidemia, unspecified: Secondary | ICD-10-CM | POA: Diagnosis not present

## 2020-05-30 DIAGNOSIS — I5032 Chronic diastolic (congestive) heart failure: Secondary | ICD-10-CM | POA: Diagnosis not present

## 2020-05-30 DIAGNOSIS — E1169 Type 2 diabetes mellitus with other specified complication: Secondary | ICD-10-CM | POA: Diagnosis not present

## 2020-05-30 MED ORDER — FUROSEMIDE 40 MG PO TABS: 40.0000 mg | ORAL_TABLET | Freq: Every day | ORAL | 2 refills | Status: DC

## 2020-05-30 NOTE — Patient Instructions (Signed)
Medication Instructions:  Your physician has recommended you make the following change in your medication:  1- INCREASE Furosemide to 40 mg by mouth once a day.  *If you need a refill on your cardiac medications before your next appointment, please call your pharmacy*  Lab Work: Your physician recommends that you return for lab work in: 3-4 weeks for [employer]. -- Anytime between 3/16 and 06/27/20 - Please go to the [facility]. You will check in at the front desk to the right as you walk into the atrium. Valet Parking is offered if needed. - No appointment needed. You may go any day between 7 am and 6 pm.  If you have labs (blood work) drawn today and your tests are completely normal, you will receive your results only by: Marland Kitchen MyChart Message (if you have MyChart) OR . A paper copy in the mail If you have any lab test that is abnormal or we need to change your treatment, we will call you to review the results.  Testing/Procedures: none  Follow-Up: At [name], you and your health needs are our priority.  As part of our continuing mission to provide you with exceptional heart care, we have created designated Provider Care Teams.  These Care Teams include your primary Cardiologist (physician) and Advanced Practice Providers (APPs -  Physician Assistants and Nurse Practitioners) who all work together to provide you with the care you need, when you need it.  We recommend signing up for the patient portal called "MyChart".  Sign up information is provided on this After Visit Summary.  MyChart is used to connect with patients for Virtual Visits (Telemedicine).  Patients are able to view lab/test results, encounter notes, upcoming appointments, etc.  Non-urgent messages can be sent to your provider as well.   To learn more about what you can do with MyChart, go to [email or web address].    Your next appointment:   4 month(s)  The format for your next appointment:   In  Person  Provider:   You may see Nelva Bush, MD or one of the following Advanced Practice Providers on your designated Care Team:    Murray Hodgkins, NP  Christell Faith, PA-C  Marrianne Mood, PA-C  Cadence Pray, [place]  Laurann Montana, NP

## 2020-05-30 NOTE — Progress Notes (Unsigned)
Follow-up Outpatient Visit Date: 05/30/2020  Primary Care Provider: [name], PA-C [place] [id number]  Chief Complaint: Follow-up CAD and chronic HFpEF  HPI:  Jacob Harrison is a 80 y.o. male with history of CAD status post CABG and redo CABG, diabetes, HFpEF, hyperlipidemia, and chronic leg pain in the setting of spinal stenosis, who presents for follow-up of coronary artery disease.  He was last seen in the office in 11/2019 by Laurann Montana, NP, at which time he reported improving exertional dyspnea after addition of furosemide in May.  He denied angina.  No further testing or medication change was pursued.  Today, Jacob Harrison reports that he has been doing fairly well.  He has stable exertional dyspnea that has been present for years, though he again notes that his breathing improved quite a bit when we added furosemide.  He has not had any chest pain, palpitations, lightheadedness, orthopnea, or PND.  He has mild leg swelling at times.  He has not needed to use any sublingual nitroglycerin.  Home blood pressures are usually 120-135/65-70.  --------------------------------------------------------------------------------------------------  Cardiovascular History & Procedures: Cardiovascular Problems:  Coronary artery disease  Ischemic cardiomyopathy  Heart failure with preserved ejection fraction (HFpEF)  Risk Factors:  Known coronary artery disease, hypertension, hyperlipidemia, diabetes mellitus, male gender, obesity, and age > 86  Cath/PCI:  LHC (10/18/12, [place], [place]): LMCA 20-30% ostial/proximal stenosis. LAD with sequential 100% and 95-99% mid stenoses. LCx with 40-50% mid stenosis. RCA with 100% proximal and 80-100% sequential distal lesions. RPDA and rPL1 have 99% and 95% stenoses, respectively. SVG->LAD and SVG->rPDA are patent. LVEF 67% with normal wall motion.  LHC (04/05/10, [place], [place]): LMCA  with 30% mid stenosis. LAD with 100% midvessel occlution. LCx with 40% mid stenosis. RCA with 100% proximal stenosis. LIMA->LAD atretic, SVG->LCx occluded, and SVG-> RCA with 80% ostial and 70% distal stenoses.  CV Surgery:  Redo CABG (04/2010, [place], [place]): SVG->LAD and SVG->rPDA.  CABG (2001, [place]): LIMA->LAD, SVG->LCx, and SVG->rPDA  EP Procedures and Devices:  None  Non-Invasive Evaluation(s):  TTE (09/23/2019): Normal LV size and wall thickness.  LVEF 60-65% with grade 1 diastolic dysfunction.  Normal RV size and function.  No atrial enlargement.  Aortic sclerosis without stenosis.  Normal CVP.  Pharmacologic MPI (11/06/16): Low risk study without ischemia or scar. LVEF 48% (calculated) but visually appears normal.  TTE (10/21/16): Normal LV size with mild LVH. LVEF 65-70% with normal diastolic function. Mild LA enlargement. Normal RV size and function.  ABIs (03/08/15): Right 1.25, left 1.26; no significant change with exercise.  Exercise myocardial perfusion stress test (10/07/12): Small to moderate in size, mild to moderate in severity, partially reversible defect involving the mid inferior and inferolateral segments. LVEF 65%.   Recent CV Pertinent Labs: Lab Results  Component Value Date   CHOL 160 04/16/2020   HDL 69 04/16/2020   LDLCALC 69 04/16/2020   TRIG 129 04/16/2020   CHOLHDL 2.3 04/16/2020   CHOLHDL 2.7 11/14/2016   K 4.4 04/16/2020   MG 1.9 02/25/2019   BUN 10 04/16/2020   CREATININE 0.77 04/16/2020    Past medical and surgical history were reviewed and updated in EPIC.  Current Meds  Medication Sig  . amLODipine (NORVASC) 5 MG tablet TAKE 1 TABLET BY MOUTH EVERY DAY  . aspirin EC 81 MG tablet Take 1 tablet (81 mg total) by mouth daily.  . carvedilol (COREG) 3.125 MG tablet TAKE 1 TABLET (  3.125 MG TOTAL) BY MOUTH 2 (TWO) TIMES DAILY WITH A MEAL.  Marland Kitchen Cholecalciferol (VITAMIN D-3 PO) Take 800 Units by mouth daily.  . Coenzyme Q10 (CO Q-10) 200 MG CAPS  Take 200 mg by mouth daily.   . cyanocobalamin 500 MCG tablet Take 500 mcg by mouth daily.  . diclofenac Sodium (VOLTAREN) 1 % GEL Apply 2 g topically 2 (two) times daily as needed.  . ezetimibe (ZETIA) 10 MG tablet TAKE 1 TABLET BY MOUTH EVERY DAY  . finasteride (PROSCAR) 5 MG tablet TAKE 1 TABLET BY MOUTH EVERY DAY  . furosemide (LASIX) 20 MG tablet Take 1 tablet (20 mg total) by mouth daily.  . Glucosamine-Chondroit-Vit C-Mn (GLUCOSAMINE 1500 COMPLEX) CAPS Take 1 capsule by mouth daily.  . Lancets (ONETOUCH ULTRASOFT) lancets Use to check blood sugars fasting daily  . metFORMIN (GLUCOPHAGE) 500 MG tablet 1/2 tablet by mouth daily with food  . Multiple Vitamins-Minerals (PRESERVISION AREDS 2 PO) Take 1 capsule by mouth 2 (two) times a day.  . nitroGLYCERIN (NITROSTAT) 0.4 MG SL tablet Place 1 tablet (0.4 mg total) under the tongue as needed.  . ranolazine (RANEXA) 1000 MG SR tablet Take 1 tablet by mouth twice daily  . rosuvastatin (CRESTOR) 20 MG tablet TAKE 1 TABLET BY MOUTH EVERYDAY AT BEDTIME   Current Facility-Administered Medications for the 05/30/20 encounter (Office Visit) with End, Harrell Gave, MD  Medication  . ipratropium-albuterol (DUONEB) 0.5-2.5 (3) MG/3ML nebulizer solution 3 mL    Allergies: Other, Isosorbide mononitrate [isosorbide dinitrate er], and Tetracyclines & related  Social History   Tobacco Use  . Smoking status: Never Smoker  . Smokeless tobacco: Never Used  Vaping Use  . Vaping Use: Never used  Substance Use Topics  . Alcohol use: Yes    Alcohol/week: 11.0 standard drinks    Types: 5 Glasses of wine, 6 Cans of beer per week  . Drug use: No    Family History  Problem Relation Age of Onset  . Diabetes Mother   . Hyperlipidemia Mother   . Cancer Father        colon  . Alcohol abuse Maternal Uncle   . Diabetes Maternal Uncle   . Hyperlipidemia Maternal Uncle   . Cancer Paternal Uncle        colon    Review of Systems: A 12-system review of  systems was performed and was negative except as noted in the HPI.  --------------------------------------------------------------------------------------------------  Physical Exam: BP 132/70 (BP Location: Left Arm, Patient Position: Sitting, Cuff Size: Normal)   Pulse 78   Ht 5\' 7"  (1.702 m)   Wt 204 lb (92.5 kg)   SpO2 97%   BMI 31.95 kg/m   General:  NAD. Neck: No JVD or HJR. Lungs: Clear to auscultation bilaterally without wheezes or crackles. Heart: Regular rate and rhythm with 2/6 systolic murmur.  No rubs or gallops. Abdomen: Soft, nontender, nondistended. Extremities: 1+ distal calf edema bilaterally.  EKG: Normal sinus rhythm with inferior infarct.  Lab Results  Component Value Date   WBC 11.5 (H) 04/16/2020   HGB 14.0 04/16/2020   HCT 40.6 04/16/2020   MCV 94 04/16/2020   PLT 241 04/16/2020    Lab Results  Component Value Date   NA 137 04/16/2020   K 4.4 04/16/2020   CL 100 04/16/2020   CO2 22 04/16/2020   BUN 10 04/16/2020   CREATININE 0.77 04/16/2020   GLUCOSE 110 (H) 04/16/2020   ALT 29 04/16/2020    Lab Results  Component Value Date   CHOL 160 04/16/2020   HDL 69 04/16/2020   LDLCALC 69 04/16/2020   TRIG 129 04/16/2020   CHOLHDL 2.3 04/16/2020    --------------------------------------------------------------------------------------------------  ASSESSMENT AND PLAN: Coronary artery disease with stable angina: No angina reported.  Chronic DOE is stable and most likely driven by HFpEF rather than worsening coronary insufficiency.  Continue current medications for secondary prevention, as well as antianginal therapy consisting of amlodipine, carvedilol, and granola seen.  Chronic HFpEF: Jacob Harrison notes improvement in dyspnea with previous addition of low-dose furosemide.  He still appears mildly volume overloaded with lower extremity edema.  We have agreed to increase furosemide to 40 mg daily and repeat a BMP in 3-4 weeks to ensure stable renal  function and electrolytes.  Hypertension: Blood pressure mildly elevated today but usually a bit better at home (goal blood pressure less than 130/80 in the setting of diabetes mellitus).  As above, will increase furosemide but otherwise defer medication changes today.  Hyperlipidemia associated with type 2 diabetes mellitus: LDL reasonable at 69 on last check in 04/2020.  Continue ezetimibe and rosuvastatin for target LDL less than 70.  Ongoing management of DM per PCP.  Follow-up: Return to clinic in 4 months.  Nelva Bush, MD 05/30/2020 11:30 AM

## 2020-06-05 DIAGNOSIS — H527 Unspecified disorder of refraction: Secondary | ICD-10-CM | POA: Diagnosis not present

## 2020-06-05 IMAGING — CR LUMBAR SPINE - 2-3 VIEW
3 series · 3 of 3 positions shown · non-contrast
Comparison: 09/22/2017

CLINICAL DATA: Operative localization.

EXAM:
LUMBAR SPINE - 2-3 VIEW

[lateral (1 of 3)]
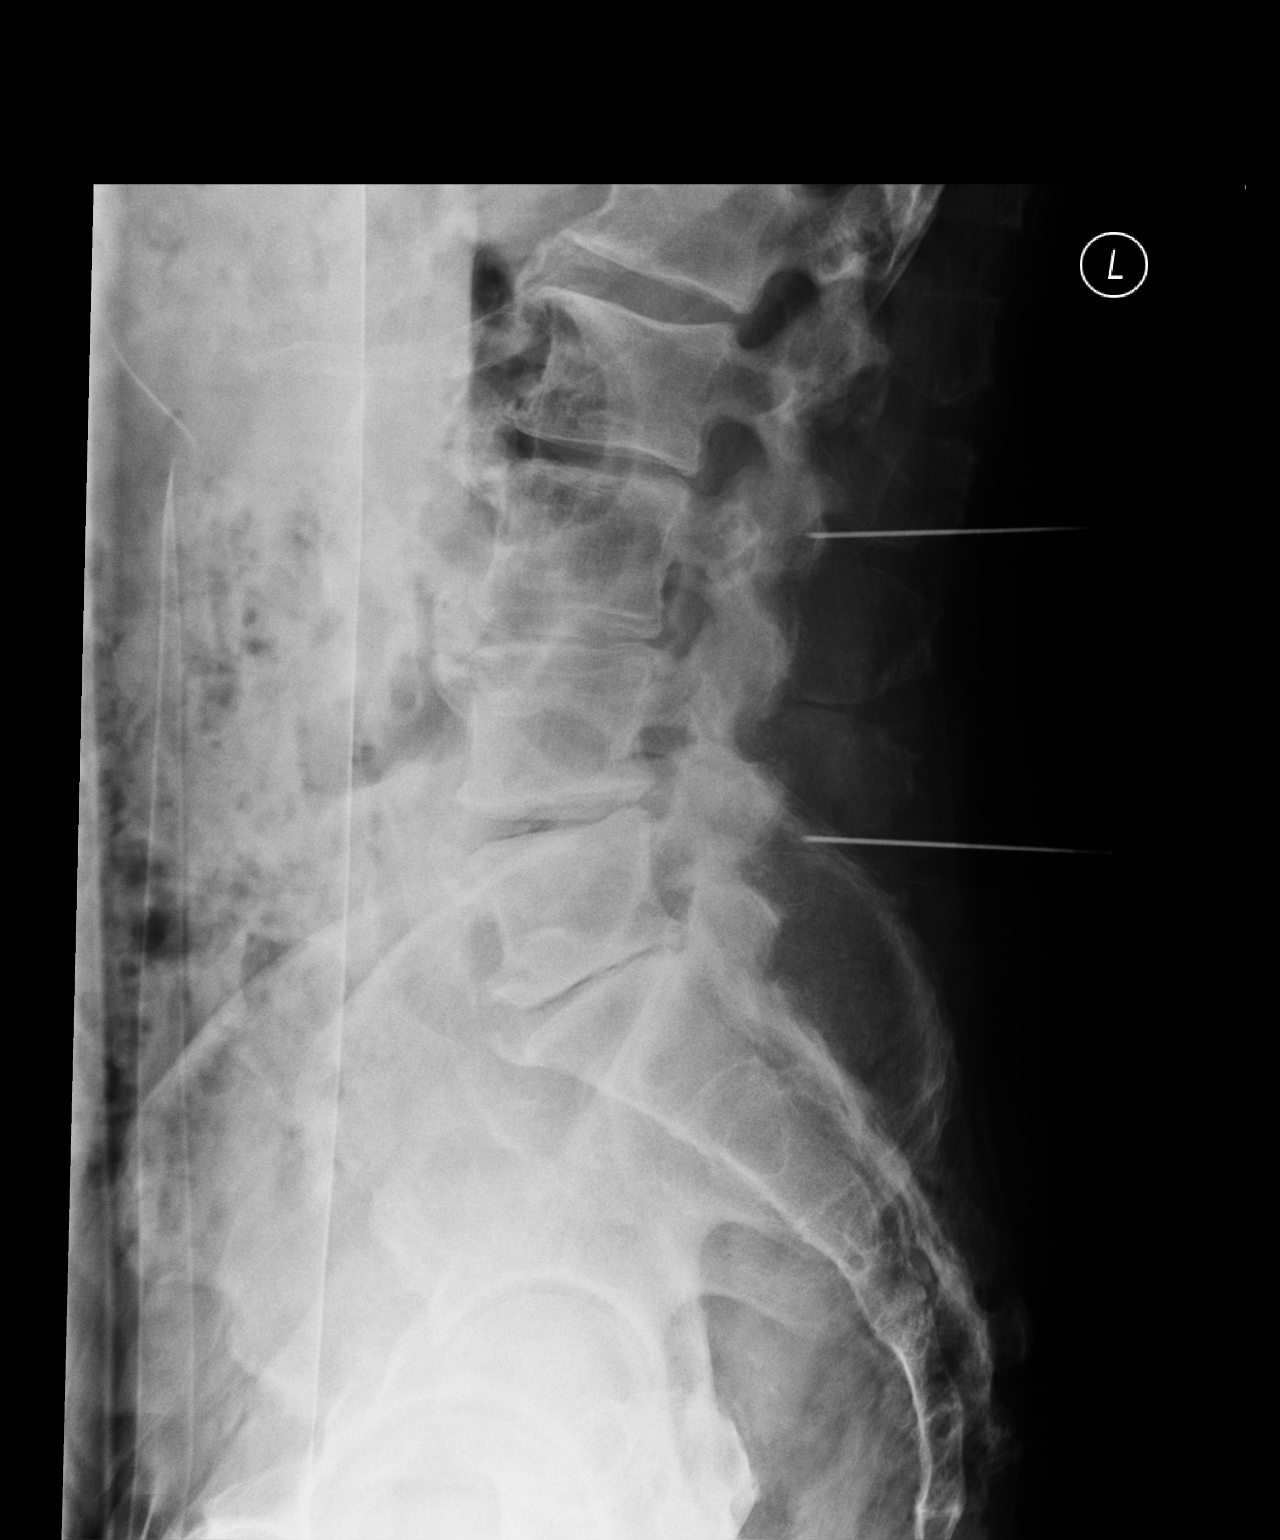

[lateral (2 of 3)]
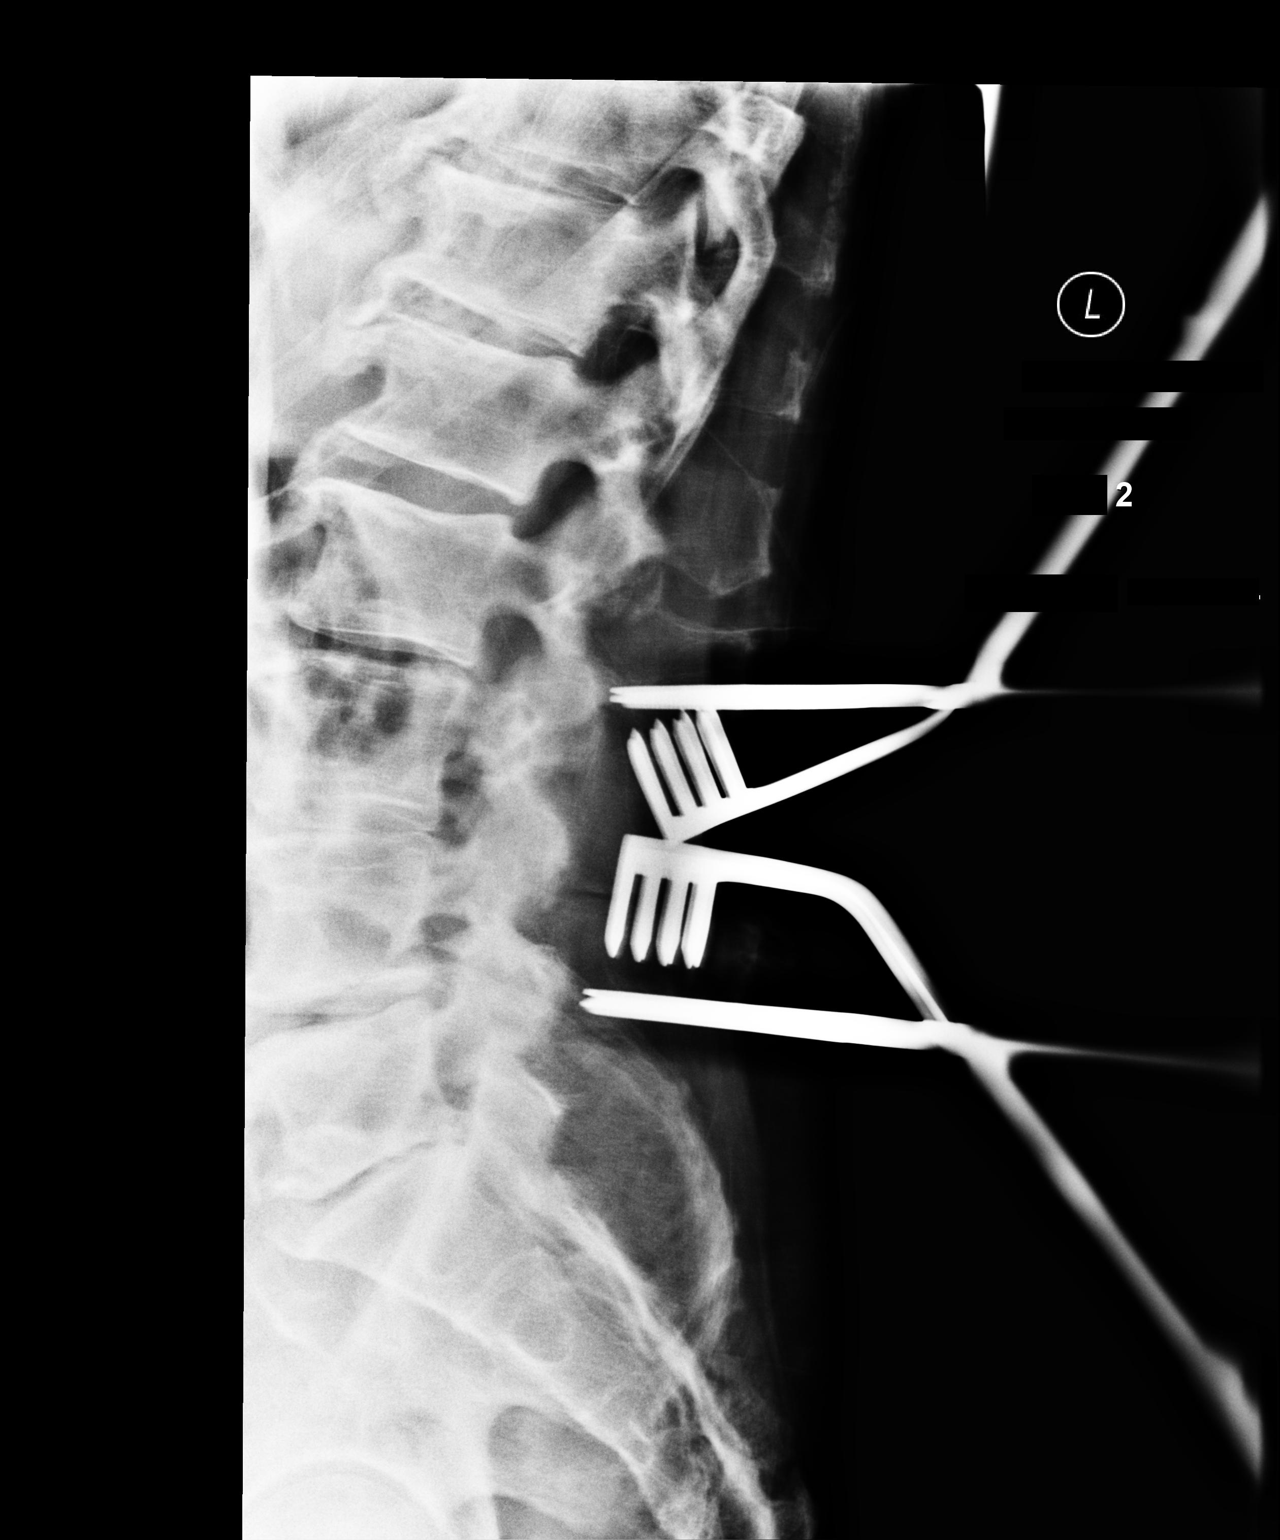

[lateral (3 of 3)]
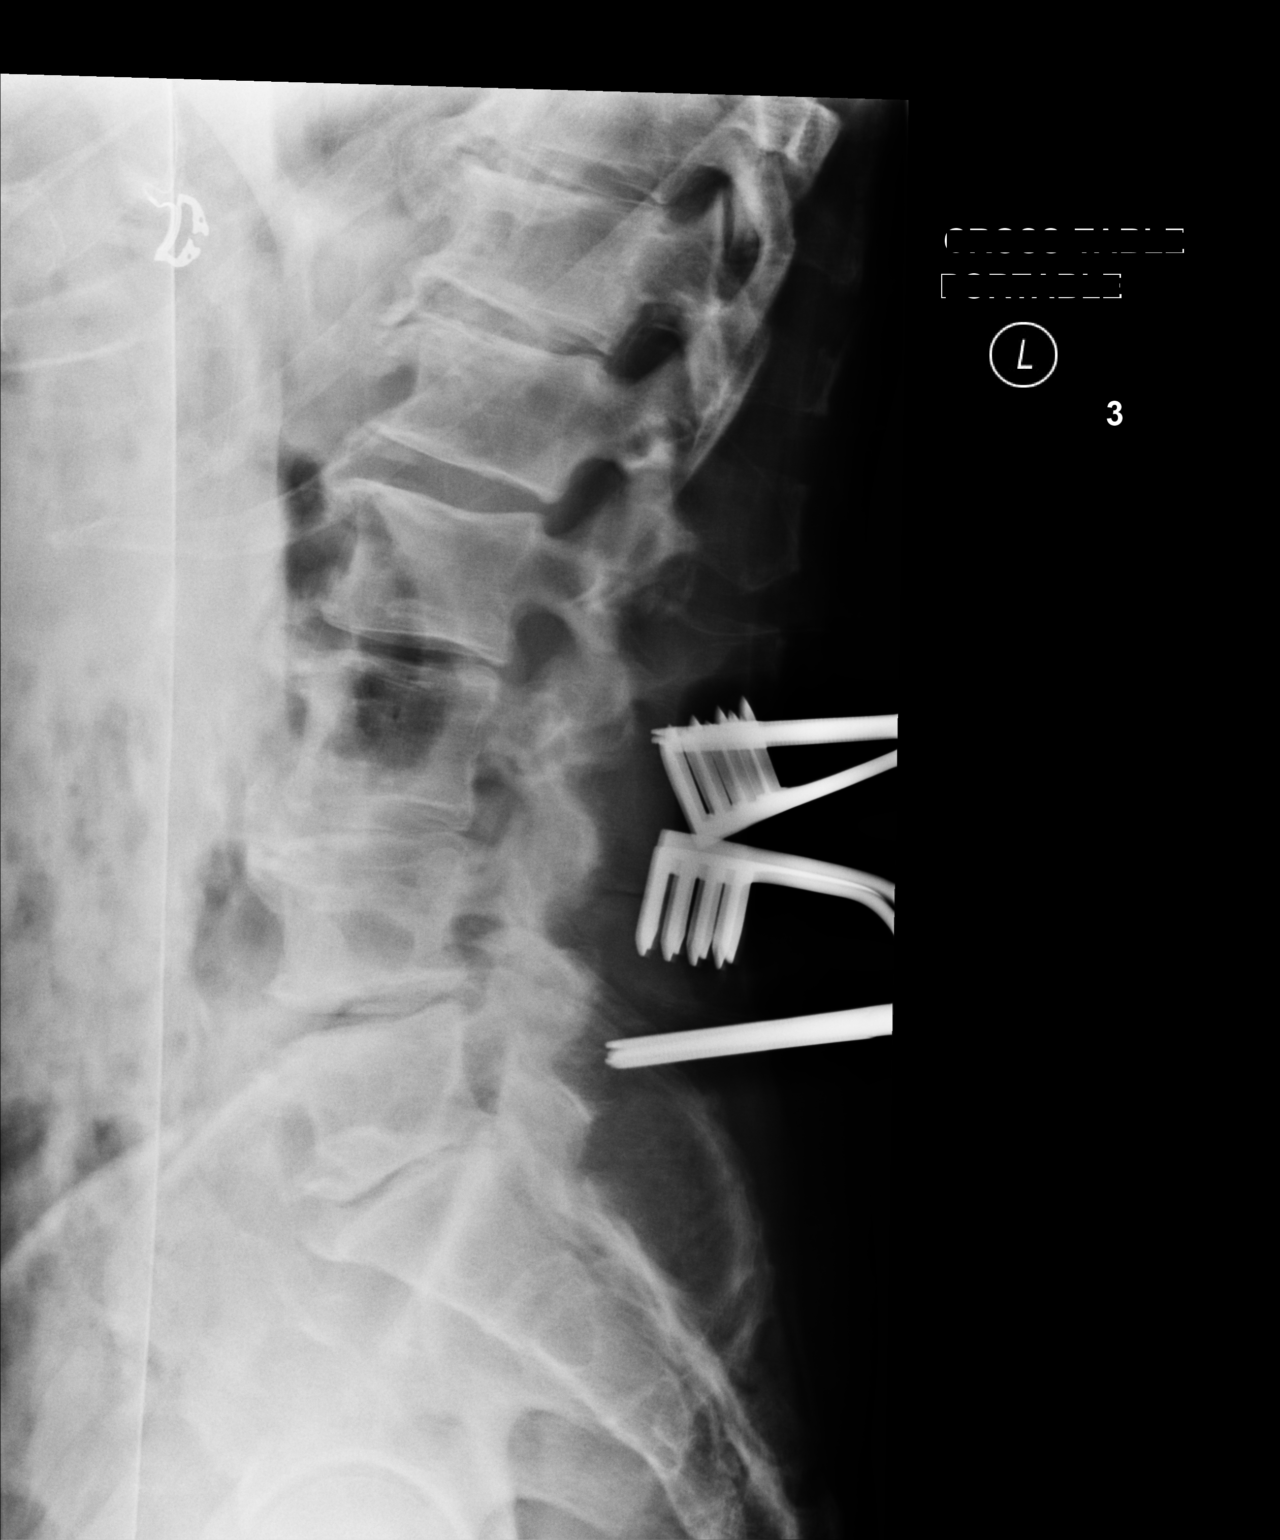

[3 of 3 positions shown; findings below may reference images not displayed]

FINDINGS: Initial film shows needles between the spinous processes of L2 and
L3 and L4 and L5. Second film shows clamps directed at the disc
levels of L2-3 and L4-5. Last film shows clamps directed at the
pedicle levels of L3 and L5.
IMPRESSION: Eventual localization of the pedicle levels of L3 and L5.

## 2020-06-06 ENCOUNTER — Other Ambulatory Visit: Payer: Self-pay

## 2020-06-06 ENCOUNTER — Ambulatory Visit: Payer: Medicare Other | Admitting: Physician Assistant

## 2020-06-06 ENCOUNTER — Encounter: Payer: Self-pay | Admitting: Physician Assistant

## 2020-06-06 DIAGNOSIS — Z1283 Encounter for screening for malignant neoplasm of skin: Secondary | ICD-10-CM

## 2020-06-06 DIAGNOSIS — D692 Other nonthrombocytopenic purpura: Secondary | ICD-10-CM

## 2020-06-06 DIAGNOSIS — L738 Other specified follicular disorders: Secondary | ICD-10-CM | POA: Diagnosis not present

## 2020-06-06 DIAGNOSIS — L570 Actinic keratosis: Secondary | ICD-10-CM

## 2020-06-14 DIAGNOSIS — R2689 Other abnormalities of gait and mobility: Secondary | ICD-10-CM | POA: Insufficient documentation

## 2020-06-19 ENCOUNTER — Telehealth: Payer: Self-pay | Admitting: Internal Medicine

## 2020-06-19 NOTE — Telephone Encounter (Signed)
Called patient. Saw Dr End on 05/30/20 and furosemide was increased to 40 mg daily. Since increasing the furosemide, he has been experiencing the following: - When he reaches up to reach something in the cabinet, he feels like he's going to fall back. - When he bends down he feels like he's going to fall forward. - Occasional dizziness when getting out of this car.  Denies SOB, chest pain, ringing in ears, or lower extremity swelling. He feels his dyspnea has improved with the increased furosemide. BP 130-140/ 70-75, heart rates 70-75.  He is supposed to get follow up lab work sometime this week.  Advised patient to go today or tomorrow if possible so that Dr End can advise on furosemide accordingly. He will also move slowly while changing positions and performing tasks such as reaching up or bending down. He verbalized understanding.

## 2020-06-19 NOTE — Telephone Encounter (Signed)
Please call regarding Furosemide increase, patient states he has felt "off balanced" . DIzziness, off balanced. States his blood pressure has been ranging 130-140/ 70-75.

## 2020-06-20 NOTE — Telephone Encounter (Signed)
We will await results of labs and discuss continuing current dose of furosemide versus going back down to prior dose.  In the meantime, I encourage [name] to stay well-hydrated with water but to continue limiting his sodium intake.  Nelva Bush, MD [facility]

## 2020-06-21 ENCOUNTER — Other Ambulatory Visit: Payer: Self-pay

## 2020-06-21 ENCOUNTER — Other Ambulatory Visit
Admission: RE | Admit: 2020-06-21 | Discharge: 2020-06-21 | Disposition: A | Discharge status: Home / Self Care | Payer: Medicare Other | Source: Ambulatory Visit | Attending: Internal Medicine | Admitting: Internal Medicine

## 2020-06-21 DIAGNOSIS — I10 Essential (primary) hypertension: Secondary | ICD-10-CM

## 2020-06-21 DIAGNOSIS — I25118 Atherosclerotic heart disease of native coronary artery with other forms of angina pectoris: Secondary | ICD-10-CM | POA: Insufficient documentation

## 2020-06-21 LAB — BASIC METABOLIC PANEL
Anion gap: 10 (ref 5–15)
BUN: 15 mg/dL (ref 8–23)
CO2: 26 mmol/L (ref 22–32)
Calcium: 9.6 mg/dL (ref 8.9–10.3)
Chloride: 99 mmol/L (ref 98–111)
Creatinine, Ser: 0.88 mg/dL (ref 0.61–1.24)
GFR, Estimated: >60 mL/min (ref >60)
Glucose, Bld: 126 mg/dL — ABNORMAL HIGH (ref 70–99)
Potassium: 4 mmol/L (ref 3.5–5.1)
Sodium: 135 mmol/L (ref 135–145)

## 2020-06-22 ENCOUNTER — Telehealth: Payer: Self-pay | Admitting: *Deleted

## 2020-06-22 NOTE — Telephone Encounter (Signed)
See telephone encounter from today for lab results and further plan of care.

## 2020-06-22 NOTE — Telephone Encounter (Signed)
-----   Message from Nelva Bush, MD sent at 06/22/2020 11:37 AM EDT ----- Labs are stable compared to 2 months ago without explanation for recent symptoms following escalation of furosemide.  If Jacob Harrison continues to feel dizzy, he should decrease furosemide back to 20 mg daily.

## 2020-06-22 NOTE — Telephone Encounter (Signed)
The patient has been notified of the result and verbalized understanding.  All questions (if any) were answer. He is going to check with his PCP about the dizziness as well.

## 2020-06-25 ENCOUNTER — Encounter: Payer: Self-pay | Admitting: Physician Assistant

## 2020-06-25 NOTE — Progress Notes (Signed)
   Follow-Up Visit   Subjective  Jacob Harrison is a 80 y.o. male who presents for the following: Skin Problem (White spots on both arms, the left arm is flaking off x months, scaly, dry. Per patient he has been using dermend that doesn't help much. ).   The following portions of the chart were reviewed this encounter and updated as appropriate:      Objective  Well appearing patient in no apparent distress; mood and affect are within normal limits.  All skin waist up examined.  Objective  Mid Forehead: Erythematous patches with gritty scale.  Objective  Left Buccal Cheek: Pink umbilicated papule  Objective  Left Forearm - Anterior, Right Forearm - Anterior: Purpura  Objective  waist up: No atypical nevi No signs of non-mole skin cancer.    Assessment & Plan  AK (actinic keratosis) Mid Forehead  Destruction of lesion - Mid Forehead Complexity: simple   Destruction method: cryotherapy   Informed consent: discussed and consent obtained   Timeout:  patient name, date of birth, surgical site, and procedure verified Lesion destroyed using liquid nitrogen: Yes   Cryotherapy cycles:  3 Outcome: patient tolerated procedure well with no complications   Post-procedure details: wound care instructions given    Sebaceous hyperplasia Left Buccal Cheek  observe  Purpura (HCC) (2) Left Forearm - Anterior; Right Forearm - Anterior  No treatment available  Encounter for screening for malignant neoplasm of skin waist up  Yearly skin exams    I, Frazer Rain[place], PA-C, have reviewed all documentation's for this visit.  The documentation on 06/25/20 for the exam, diagnosis, procedures and orders are all accurate and complete.

## 2020-06-29 ENCOUNTER — Telehealth: Payer: Self-pay | Admitting: Physician Assistant

## 2020-06-29 NOTE — Telephone Encounter (Signed)
Spoke with patient on the phone who states he has been feeling "loss of balance" x 1 month. BP running around 140/70. Pt has Cards whom he has reached out to and they advised they were not concerned with BP elevation at this time. Pt denies any chest pain, SOB, headache, chest tightness or L arm pain.   Patient states his fasting glucose level has been 115-130 first thing in the am.   Pt scheduled for Tuesday at 2pm with [facility].  Pt advised should his symptoms worsen or he should develop SOB, chest pain, chest tightness, L arm pain, headache or syncope he should go to ER for evaluation. Pt verbalized understanding.   Sending information about Vertigo and above advise to patient via mychart. AS, CMA

## 2020-07-03 ENCOUNTER — Ambulatory Visit (INDEPENDENT_AMBULATORY_CARE_PROVIDER_SITE_OTHER): Payer: Medicare Other | Admitting: Physician Assistant

## 2020-07-03 ENCOUNTER — Encounter: Payer: Self-pay | Admitting: Physician Assistant

## 2020-07-03 ENCOUNTER — Other Ambulatory Visit: Payer: Self-pay

## 2020-07-03 VITALS — BP 136/79 | HR 74 | Temp 98.7°F | Ht 67.0 in | Wt 208.0 lb

## 2020-07-03 DIAGNOSIS — R251 Tremor, unspecified: Secondary | ICD-10-CM

## 2020-07-03 DIAGNOSIS — R2681 Unsteadiness on feet: Secondary | ICD-10-CM

## 2020-07-03 NOTE — Progress Notes (Signed)
Acute Office Visit  Subjective:    Patient ID: Jacob Harrison, male    DOB: 1941/03/15, 80 y.o.   MRN: [id number]  Chief Complaint  Patient presents with  . Dizziness    HPI Patient is in today for c/o feeling off balance with certain positions- bending and over-reaching x 1 month. States has learned to be cautious and usually holds onto something for support so he does not loose his balance. States Lasix was increased to 40 mg by cardiologist and symptoms started around the same timeframe so thought it was maybe medication related so he reached out and Dr. Saunders Revel resumed his original dose of 20 mg but symptoms failed to improve. Denies new medications/supplements, dizziness, feeling light headed, chest pain, tinnitus, palpitations, shortness of breath at rest or confusion. States has noticed being more forgetful but nothing significant. Reports good hydration.   Past Medical History:  Diagnosis Date  . (HFpEF) heart failure with preserved ejection fraction ([place])    a. 10/2016 Echo: EF 65-70%, mild LVH, mildly dil LA. RV fxn nl.  . Arthritis   . BPH (benign prostatic hyperplasia)   . Coronary artery disease    a. 2001 CABG x 3 [facility]): LIMA->LAD, VG->LCX, VG->RPDA; b. 04/05/2010 Cath ([place]): LM 70m, LAD 160m, LCX 71m, RCA 100p, LIMA->LAD atretic, VG->LCX 100, VG->RCA 80ost, 70d; c. 04/2010 Redo CABG x 2 ([place]): VG->LAD, VG->RPDA; d. 10/2012 Cath [facility]): LM 20-30, LAD 100/95-98m, LCX 40-30m, RCA 100p, 80-100d, RPDA 99, RPL1 95, VG->LAD ok, VG->RPDA ok, EF 67%; e. 11/2016 MV: EF 48%, No ischemia.  . 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 Hyperlipidemia   . Hypertension   . Scoliosis   . Spinal stenosis     Past Surgical History:  Procedure Laterality Date  . CARDIAC CATHETERIZATION    . CORONARY ARTERY BYPASS GRAFT     x 2 (2001 and redo in late 2011 or early 2012)  . HERNIA REPAIR Left    inguinal  . LUMBAR LAMINECTOMY/DECOMPRESSION MICRODISCECTOMY  N/A 10/20/2018   Procedure: L3-4, L4-5 LUMBAR DECOMPRESSION with dural repair.;  Surgeon: Marybelle Killings, MD;  Location: [facility];  Service: Orthopedics;  Laterality: N/A;  . TONSILLECTOMY AND ADENOIDECTOMY    . TRANSURETHRAL RESECTION OF PROSTATE  1990    Family History  Problem Relation Age of Onset  . Diabetes Mother   . Hyperlipidemia Mother   . Cancer Father        colon  . Alcohol abuse Maternal Uncle   . Diabetes Maternal Uncle   . Hyperlipidemia Maternal Uncle   . Cancer Paternal Uncle        colon    Social History   Socioeconomic History  . Marital status: Married    Spouse name: Not on file  . Number of children: Not on file  . Years of education: Not on file  . Highest education level: Not on file  Occupational History  . Not on file  Tobacco Use  . Smoking status: Never Smoker  . Smokeless tobacco: Never Used  Vaping Use  . Vaping Use: Never used  Substance and Sexual Activity  . Alcohol use: Yes    Alcohol/week: 11.0 standard drinks    Types: 5 Glasses of wine, 6 Cans of beer per week  . Drug use: No  . Sexual activity: Not Currently    Birth control/protection: None  Other Topics Concern  . Not on file  Social History Narrative  .  Not on file   Social Determinants of Health   Financial Resource Strain: Not on file  Food Insecurity: Not on file  Transportation Needs: Not on file  Physical Activity: Not on file  Stress: Not on file  Social Connections: Not on file  Intimate Partner Violence: Not on file    Outpatient Medications Prior to Visit  Medication Sig Dispense Refill  . amLODipine (NORVASC) 5 MG tablet TAKE 1 TABLET BY MOUTH EVERY DAY 90 tablet 1  . aspirin EC 81 MG tablet Take 1 tablet (81 mg total) by mouth daily.    . carvedilol (COREG) 3.125 MG tablet TAKE 1 TABLET (3.125 MG TOTAL) BY MOUTH 2 (TWO) TIMES DAILY WITH A MEAL. 180 tablet 0  . Cholecalciferol (VITAMIN D-3 PO) Take 800 Units by mouth daily.    . Coenzyme Q10 (CO Q-10) 200  MG CAPS Take 200 mg by mouth daily.     . cyanocobalamin 500 MCG tablet Take 500 mcg by mouth daily.    . diclofenac Sodium (VOLTAREN) 1 % GEL Apply 2 g topically 2 (two) times daily as needed. 120 g 0  . ezetimibe (ZETIA) 10 MG tablet TAKE 1 TABLET BY MOUTH EVERY DAY 90 tablet 0  . finasteride (PROSCAR) 5 MG tablet TAKE 1 TABLET BY MOUTH EVERY DAY 90 tablet 0  . furosemide (LASIX) 40 MG tablet Take 1 tablet (40 mg total) by mouth daily. 90 tablet 2  . Glucosamine-Chondroit-Vit C-Mn (GLUCOSAMINE 1500 COMPLEX) CAPS Take 1 capsule by mouth daily.    . Lancets (ONETOUCH ULTRASOFT) lancets Use to check blood sugars fasting daily 100 each 12  . metFORMIN (GLUCOPHAGE) 500 MG tablet 1/2 tablet by mouth daily with food 90 tablet 2  . Multiple Vitamins-Minerals (PRESERVISION AREDS 2 PO) Take 1 capsule by mouth 2 (two) times a day.    . nitroGLYCERIN (NITROSTAT) 0.4 MG SL tablet Place 1 tablet (0.4 mg total) under the tongue as needed. 25 tablet 3  . ranolazine (RANEXA) 1000 MG SR tablet Take 1 tablet by mouth twice daily 180 tablet 3  . rosuvastatin (CRESTOR) 20 MG tablet TAKE 1 TABLET BY MOUTH EVERYDAY AT BEDTIME 90 tablet 0   Facility-Administered Medications Prior to Visit  Medication Dose Route Frequency Provider Last Rate Last Admin  . ipratropium-albuterol (DUONEB) 0.5-2.5 (3) MG/3ML nebulizer solution 3 mL  3 mL Nebulization Q6H Danford, Katy D, NP   3 mL at 07/21/17 1646    Allergies  Allergen Reactions  . Other Itching and Rash  . Isosorbide Mononitrate [Isosorbide Dinitrate Er]     Headaches  . Tetracyclines & Related Rash    Review of Systems Review of Systems:  A fourteen system review of systems was performed and found to be positive as per HPI.  Objective:    Physical Exam General:  Well Developed, well nourished, in no acute distress  Neuro:  Alert and oriented,  extra-ocular muscles intact, no nystagmus appreciated, mild hand tremor noted against gravity, normal rapid  alternating movement, tandem gait unsteady  HEENT:  Normocephalic, atraumatic, normal TM's and external canal of both ears, neck supple Skin:  no gross rash, warm, pink. Cardiac:  RRR, S1 S2, +murmur  Respiratory:  ECTA B/L, Not using accessory muscles, speaking in full sentences- unlabored. Vascular:  Ext warm, no cyanosis apprec.; cap RF less 2 sec. Psych:  No HI/SI, judgement and insight good, Euthymic mood. Full Affect.   BP 136/79   Pulse 74   Temp 98.7 F (37.1 C)  Ht 5\' 7"  (1.702 m)   Wt 208 lb (94.3 kg)   SpO2 92%   BMI 32.58 kg/m  Wt Readings from Last 3 Encounters:  07/03/20 208 lb (94.3 kg)  05/30/20 204 lb (92.5 kg)  04/18/20 200 lb (90.7 kg)    Health Maintenance Due  Topic Date Due  . Hepatitis C Screening  Never done  . COVID-19 Vaccine (3 - Pfizer risk 4-dose series) 06/29/2019  . FOOT EXAM  02/22/2020    There are no preventive care reminders to display for this patient.   Lab Results  Component Value Date   TSH 2.020 04/16/2020   Lab Results  Component Value Date   WBC 11.5 (H) 04/16/2020   HGB 14.0 04/16/2020   HCT 40.6 04/16/2020   MCV 94 04/16/2020   PLT 241 04/16/2020   Lab Results  Component Value Date   NA 135 06/21/2020   K 4.0 06/21/2020   CO2 26 06/21/2020   GLUCOSE 126 (H) 06/21/2020   BUN 15 06/21/2020   CREATININE 0.88 06/21/2020   BILITOT 0.5 04/16/2020   ALKPHOS 94 04/16/2020   AST 26 04/16/2020   ALT 29 04/16/2020   PROT 6.9 04/16/2020   ALBUMIN 4.6 04/16/2020   CALCIUM 9.6 06/21/2020   ANIONGAP 10 06/21/2020   Lab Results  Component Value Date   CHOL 160 04/16/2020   Lab Results  Component Value Date   HDL 69 04/16/2020   Lab Results  Component Value Date   LDLCALC 69 04/16/2020   Lab Results  Component Value Date   TRIG 129 04/16/2020   Lab Results  Component Value Date   CHOLHDL 2.3 04/16/2020   Lab Results  Component Value Date   HGBA1C 6.1 (H) 04/16/2020       Assessment & Plan:   Problem  List Items Addressed This Visit   None   Visit Diagnoses    Unsteady gait    -  Primary   Relevant Orders   Ambulatory referral to Neurology   Tremor       Relevant Orders   Ambulatory referral to Neurology     Unsteady gait, tremor: -Discussed with patient possible etiologies for unsteady balance with position changes. Orthostatics performed and normal, recent BMP unremarkable, no electrolyte imbalance. Patient unable to perform heel-toe-walking due to unsteadiness and mild postural tremor noted so will proceed with referral to Neurology for further evaluation. Patient is agreeable.      No orders of the defined types were placed in this encounter.    [name], PA-C

## 2020-07-10 NOTE — Progress Notes (Signed)
Assessment/Plan:   1.  Bilateral carotid bruits  -We will check carotid ultrasound.  He and I discussed that, if there is some stenosis, it is generally not treated surgically unless over 80%.  He expressed understanding.  2.  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  He has longstanding diabetes, although it really is well controlled now.  He and I discussed also the role potentially that alcohol could be playing.  He has decreased the amount of alcohol over the last several weeks, but still drinks daily.  We talked about potentially decreasing dose to no more than 2 or 3 days/week.  -We discussed that once 1 has peripheral neuropathy, it is not curable.  However, physical therapy can help.  3.  Tremor, by referral diagnosis  -Did not really see any tremor on examination.  Reassured the patient he had no evidence of Parkinson's disease.  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  This is not noticeable to patient, is mild, and requires no treatment at this point.   Subjective:   Jacob Harrison was seen today in the movement disorders clinic for neurologic consultation at the request of [name], PA-C.  The consultation is for the evaluation of postural tremor and balance change.  PCP did orthostatics and neg and sent here for further eval.   Pt states that he actually doesn't notice tremor but PCP noted it.    His tremor is only with holding out the hands (he never noticed it) and "I maybe see it if I hold it out."  No trouble with using computer.   Spills glass of liquid if full:  No.  Affects ADL's (tying shoes, brushing teeth, etc):  No.  Tremor inducing meds:  No.  Other Specific Symptoms:  Voice: no changes Sleep: trouble sleeping due to getting up to use the RR  Vivid Dreams:  some  Acting out dreams:  No. Wet Pillows: No. Postural symptoms:  Yes.   - issue is with reaching up and looking up to get something  out of a kitchen cabinet and he will feel like he is going to fall.  Similarly, if he reaches down to touch dog, he feels that he is off balance.  No paresthesias of the feet.  He is diabetic and has been x 15-20 years.  A1C have been in the 5.7-6.1.  No retinopathy.  Uses cane only when in unfamiliar territory.  Hx of back sx.    Falls?  No. Bradykinesia symptoms: none Loss of smell:  No. Loss of taste:  No. Urinary Incontinence:  No., but urgency with Lasix N/V:  No. Lightheaded:  Yes.  , briefly with looking up  Syncope: No. Diplopia:  No. (had episode 2 years ago for 1 hour and saw eye doctor but none since) Dyskinesia:  No.  Neuroimaging of the brain has not previously been performed.    ALLERGIES:   Allergies  Allergen Reactions  . Other Itching and Rash  . Isosorbide Mononitrate [Isosorbide Dinitrate Er]     Headaches  . Tetracyclines & Related Rash    CURRENT MEDICATIONS:  Current Outpatient Medications  Medication Instructions  . amLODipine (NORVASC) 5 MG tablet TAKE 1 TABLET BY MOUTH EVERY DAY  . aspirin EC 81 mg, Oral, Daily  . carvedilol (COREG) 3.125 mg, Oral, 2 times daily with meals  . Cholecalciferol (VITAMIN D-3 PO) 800 Units, Oral, Daily  . Co Q-10 200 mg, Oral,  Daily  . diclofenac Sodium (VOLTAREN) 2 g, Topical, 2 times daily PRN  . ezetimibe (ZETIA) 10 MG tablet TAKE 1 TABLET BY MOUTH EVERY DAY  . finasteride (PROSCAR) 5 MG tablet TAKE 1 TABLET BY MOUTH EVERY DAY  . furosemide (LASIX) 40 mg, Oral, Daily  . Glucosamine-Chondroit-Vit C-Mn (GLUCOSAMINE 1500 COMPLEX) CAPS 1 capsule, Oral, Daily  . Lancets (ONETOUCH ULTRASOFT) lancets Use to check blood sugars fasting daily  . metFORMIN (GLUCOPHAGE) 500 MG tablet 1/2 tablet by mouth daily with food  . Multiple Vitamins-Minerals (PRESERVISION AREDS 2 PO) 1 capsule, Oral, 2 times daily  . nitroGLYCERIN (NITROSTAT) 0.4 mg, Sublingual, As needed  . ranolazine (RANEXA) 1000 MG SR tablet Take 1 tablet by mouth twice  daily  . rosuvastatin (CRESTOR) 20 MG tablet TAKE 1 TABLET BY MOUTH EVERYDAY AT BEDTIME  . vitamin B-12 (CYANOCOBALAMIN) 500 mcg, Oral, Daily    Objective:   PHYSICAL EXAMINATION:    VITALS:   Vitals:   07/12/20 1319  BP: 130/66  Pulse: 74  SpO2: 99%  Weight: 203 lb (92.1 kg)  Height: 5\' 7"  (1.702 m)    GEN:  The patient appears stated age and is in NAD. HEENT:  Normocephalic, atraumatic.  The mucous membranes are moist. The superficial temporal arteries are without ropiness or tenderness. CV:  RRR Lungs:  CTAB Neck/HEME:  There are bilateral carotid bruits.  Neck is turned to the right (appears more orthopedic in nature).  There is no associated tremor.  There is no associated hypertrophy of muscles.  Neurological examination:  Orientation: The patient is alert and oriented x3.  Cranial nerves: There is good facial symmetry.  Extraocular muscles are intact. The visual fields are full to confrontational testing. The speech is fluent and clear. Soft palate rises symmetrically and there is no tongue deviation. Hearing is intact to conversational tone. Sensation: Sensation is intact to light touch throughout (facial, trunk, extremities). Vibration is decreased distally. There is no extinction with double simultaneous stimulation.  Motor: Strength is 5/5 in the bilateral upper and lower extremities.   Shoulder shrug is equal and symmetric.  There is no pronator drift. Deep tendon reflexes: Deep tendon reflexes are 1/4 at the bilateral biceps, triceps, brachioradialis, absent at the bilateral patella and achilles. Plantar responses are downgoing bilaterally.  Movement examination: Tone: There is normal tone in the bilateral upper extremities.  The tone in the lower extremities is normal.  Abnormal movements: Patient has occasional blepharospasm.  There is no rest tremor.  There is no postural tremor.  There is no intention tremor. Coordination:  There is no decremation with RAM's, with  any form of RAMS, including alternating supination and pronation of the forearm, hand opening and closing, finger taps, heel taps and toe taps. Gait and Station: Patient pushes off of the chair to arise. The patient is mildly wide-based.  He is not shuffling. I have reviewed and interpreted the following labs independently   Chemistry      Component Value Date/Time   NA 135 06/21/2020 1410   NA 137 04/16/2020 0838   K 4.0 06/21/2020 1410   CL 99 06/21/2020 1410   CO2 26 06/21/2020 1410   BUN 15 06/21/2020 1410   BUN 10 04/16/2020 0838   CREATININE 0.88 06/21/2020 1410      Component Value Date/Time   CALCIUM 9.6 06/21/2020 1410   ALKPHOS 94 04/16/2020 0838   AST 26 04/16/2020 0838   ALT 29 04/16/2020 0838   BILITOT 0.5 04/16/2020 9509  Lab Results  Component Value Date   TSH 2.020 04/16/2020   Lab Results  Component Value Date   WBC 11.5 (H) 04/16/2020   HGB 14.0 04/16/2020   HCT 40.6 04/16/2020   MCV 94 04/16/2020   PLT 241 04/16/2020    Lab Results  Component Value Date   VITAMINB12 1,178 05/22/2016     Total time spent on today's visit was 45 minutes, including both face-to-face time and nonface-to-face time.  Time included that spent on review of records (prior notes available to me/labs/imaging if pertinent), discussing treatment and goals, answering patient's questions and coordinating care.  Cc:  [name], PA-C

## 2020-07-12 ENCOUNTER — Encounter: Payer: Self-pay | Admitting: Neurology

## 2020-07-12 ENCOUNTER — Other Ambulatory Visit: Payer: Self-pay

## 2020-07-12 ENCOUNTER — Ambulatory Visit: Payer: Medicare Other | Admitting: Neurology

## 2020-07-12 VITALS — BP 130/66 | HR 74 | Ht 67.0 in | Wt 203.0 lb

## 2020-07-12 DIAGNOSIS — E1142 Type 2 diabetes mellitus with diabetic polyneuropathy: Secondary | ICD-10-CM

## 2020-07-12 DIAGNOSIS — R0989 Other specified symptoms and signs involving the circulatory and respiratory systems: Secondary | ICD-10-CM | POA: Diagnosis not present

## 2020-07-12 NOTE — Patient Instructions (Addendum)
  Your physician has requested that you have a carotid duplex. This test is an ultrasound of the carotid arteries in your neck. It looks at blood flow through these arteries that supply the brain with blood. Allow one hour for this exam. There are no restrictions or special instructions. Once this test is approved through we will get it scheduled for you. This test will be completed at [facility] location.  Follow up will be determined after testing is completed.

## 2020-07-19 ENCOUNTER — Other Ambulatory Visit (HOSPITAL_COMMUNITY): Payer: Self-pay | Admitting: Neurology

## 2020-07-19 ENCOUNTER — Other Ambulatory Visit: Payer: Self-pay

## 2020-07-19 ENCOUNTER — Ambulatory Visit (HOSPITAL_COMMUNITY)
Admission: RE | Admit: 2020-07-19 | Discharge: 2020-07-19 | Disposition: A | Discharge status: Home / Self Care | Payer: Medicare Other | Source: Ambulatory Visit | Attending: Internal Medicine | Admitting: Internal Medicine

## 2020-07-19 DIAGNOSIS — I6521 Occlusion and stenosis of right carotid artery: Secondary | ICD-10-CM

## 2020-07-19 NOTE — Progress Notes (Signed)
LMOVM to call office back

## 2020-07-23 ENCOUNTER — Telehealth: Payer: Self-pay

## 2020-07-23 NOTE — Telephone Encounter (Signed)
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  Left is good.  He should continue ASA.  He also should make sure that this is followed up/repeated in about a year since he is not going to f/u with me any longer

## 2020-07-23 NOTE — Telephone Encounter (Signed)
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 Left is good. He should continue ASA. He also should make sure that this is followed up/repeated in about a year since he is not going to f/u with Dr Tat any longer. Pt verbalized understanding

## 2020-07-26 DIAGNOSIS — R6889 Other general symptoms and signs: Secondary | ICD-10-CM | POA: Diagnosis not present

## 2020-08-13 ENCOUNTER — Other Ambulatory Visit: Payer: Self-pay | Admitting: Internal Medicine

## 2020-08-20 ENCOUNTER — Other Ambulatory Visit: Payer: Self-pay | Admitting: Physician Assistant

## 2020-08-20 ENCOUNTER — Other Ambulatory Visit: Payer: Self-pay | Admitting: Internal Medicine

## 2020-08-20 DIAGNOSIS — N401 Enlarged prostate with lower urinary tract symptoms: Secondary | ICD-10-CM

## 2020-08-22 ENCOUNTER — Other Ambulatory Visit: Payer: Self-pay

## 2020-08-22 ENCOUNTER — Telehealth: Payer: Self-pay | Admitting: Internal Medicine

## 2020-08-22 MED ORDER — NITROGLYCERIN 0.4 MG SL SUBL: 0.4000 mg | SUBLINGUAL_TABLET | SUBLINGUAL | 3 refills | Status: DC | PRN

## 2020-08-22 NOTE — Telephone Encounter (Signed)
*  STAT* If patient is at the pharmacy, call can be transferred to refill team.   1. Which medications need to be refilled? (please list name of each medication and dose if known) nitrolgycerin  2. Which pharmacy/location (including street and city if local pharmacy) is medication to be sent to? [facility] on [address] in [place]  3. Do they need a 30 day or 90 day supply? 25 tablet with refills

## 2020-08-22 NOTE — Telephone Encounter (Signed)
nitroGLYCERIN (NITROSTAT) 0.4 MG SL tablet 25 tablet 3 08/22/2020    Sig - Route: Place 1 tablet (0.4 mg total) under the tongue as needed. - Sublingual   Sent to pharmacy as: nitroGLYCERIN (NITROSTAT) 0.4 MG SL tablet   E-Prescribing Status: Receipt confirmed by pharmacy (08/22/2020 10:50 AM EDT)     Pharmacy  [id number] - [place], [address]

## 2020-08-31 ENCOUNTER — Other Ambulatory Visit: Payer: Self-pay

## 2020-08-31 ENCOUNTER — Other Ambulatory Visit: Payer: Medicare Other

## 2020-08-31 DIAGNOSIS — I10 Essential (primary) hypertension: Secondary | ICD-10-CM | POA: Diagnosis not present

## 2020-08-31 DIAGNOSIS — I25118 Atherosclerotic heart disease of native coronary artery with other forms of angina pectoris: Secondary | ICD-10-CM | POA: Diagnosis not present

## 2020-08-31 DIAGNOSIS — E1169 Type 2 diabetes mellitus with other specified complication: Secondary | ICD-10-CM

## 2020-08-31 DIAGNOSIS — E119 Type 2 diabetes mellitus without complications: Secondary | ICD-10-CM | POA: Diagnosis not present

## 2020-08-31 DIAGNOSIS — E785 Hyperlipidemia, unspecified: Secondary | ICD-10-CM | POA: Diagnosis not present

## 2020-09-01 LAB — COMPREHENSIVE METABOLIC PANEL
ALT: 19 IU/L (ref 0–44)
AST: 17 IU/L (ref 0–40)
Albumin/Globulin Ratio: 2.7 — ABNORMAL HIGH (ref 1.2–2.2)
Albumin: 4.9 g/dL — ABNORMAL HIGH (ref 3.7–4.7)
Alkaline Phosphatase: 90 IU/L (ref 44–121)
BUN/Creatinine Ratio: 10 (ref 10–24)
BUN: 8 mg/dL (ref 8–27)
Bilirubin Total: 0.6 mg/dL (ref 0.0–1.2)
CO2: 22 mmol/L (ref 20–29)
Calcium: 9.8 mg/dL (ref 8.6–10.2)
Chloride: 96 mmol/L (ref 96–106)
Creatinine, Ser: 0.81 mg/dL (ref 0.76–1.27)
Globulin, Total: 1.8 g/dL (ref 1.5–4.5)
Glucose: 102 mg/dL — ABNORMAL HIGH (ref 65–99)
Potassium: 4.3 mmol/L (ref 3.5–5.2)
Sodium: 137 mmol/L (ref 134–144)
Total Protein: 6.7 g/dL (ref 6.0–8.5)
eGFR: 91 mL/min/{1.73_m2} (ref >59)

## 2020-09-01 LAB — CBC WITH DIFFERENTIAL/PLATELET
Basophils Absolute: 0.1 10*3/uL (ref 0.0–0.2)
Basos: 1 %
EOS (ABSOLUTE): 0.2 10*3/uL (ref 0.0–0.4)
Eos: 2 %
Hematocrit: 41.3 % (ref 37.5–51.0)
Hemoglobin: 13.8 g/dL (ref 13.0–17.7)
Immature Grans (Abs): 0 10*3/uL (ref 0.0–0.1)
Immature Granulocytes: 0 %
Lymphocytes Absolute: 2.6 10*3/uL (ref 0.7–3.1)
Lymphs: 25 %
MCH: 31.6 pg (ref 26.6–33.0)
MCHC: 33.4 g/dL (ref 31.5–35.7)
MCV: 95 fL (ref 79–97)
Monocytes Absolute: 0.9 10*3/uL (ref 0.1–0.9)
Monocytes: 9 %
Neutrophils Absolute: 6.6 10*3/uL (ref 1.4–7.0)
Neutrophils: 63 %
Platelets: 237 10*3/uL (ref 150–450)
RBC: 4.37 x10E6/uL (ref 4.14–5.80)
RDW: 13.1 % (ref 11.6–15.4)
WBC: 10.4 10*3/uL (ref 3.4–10.8)

## 2020-09-01 LAB — HEMOGLOBIN A1C
Est. average glucose Bld gHb Est-mCnc: 128 mg/dL
Hgb A1c MFr Bld: 6.1 % — ABNORMAL HIGH (ref 4.8–5.6)

## 2020-09-01 LAB — LIPID PANEL
Chol/HDL Ratio: 2.8 ratio (ref 0.0–5.0)
Cholesterol, Total: 140 mg/dL (ref 100–199)
HDL: 50 mg/dL (ref >39)
LDL Chol Calc (NIH): 63 mg/dL (ref 0–99)
Triglycerides: 156 mg/dL — ABNORMAL HIGH (ref 0–149)
VLDL Cholesterol Cal: 27 mg/dL (ref 5–40)

## 2020-09-01 LAB — TSH: TSH: 2.18 u[IU]/mL (ref 0.450–4.500)

## 2020-09-04 ENCOUNTER — Ambulatory Visit (INDEPENDENT_AMBULATORY_CARE_PROVIDER_SITE_OTHER): Payer: Medicare Other | Admitting: Physician Assistant

## 2020-09-04 ENCOUNTER — Other Ambulatory Visit: Payer: Self-pay

## 2020-09-04 ENCOUNTER — Encounter: Payer: Self-pay | Admitting: Physician Assistant

## 2020-09-04 VITALS — BP 118/69 | HR 78 | Temp 99.1°F | Ht 67.0 in | Wt 196.7 lb

## 2020-09-04 DIAGNOSIS — I152 Hypertension secondary to endocrine disorders: Secondary | ICD-10-CM | POA: Diagnosis not present

## 2020-09-04 DIAGNOSIS — K30 Functional dyspepsia: Secondary | ICD-10-CM

## 2020-09-04 DIAGNOSIS — I25118 Atherosclerotic heart disease of native coronary artery with other forms of angina pectoris: Secondary | ICD-10-CM | POA: Diagnosis not present

## 2020-09-04 DIAGNOSIS — E1159 Type 2 diabetes mellitus with other circulatory complications: Secondary | ICD-10-CM

## 2020-09-04 DIAGNOSIS — E785 Hyperlipidemia, unspecified: Secondary | ICD-10-CM | POA: Diagnosis not present

## 2020-09-04 DIAGNOSIS — E118 Type 2 diabetes mellitus with unspecified complications: Secondary | ICD-10-CM

## 2020-09-04 DIAGNOSIS — E1169 Type 2 diabetes mellitus with other specified complication: Secondary | ICD-10-CM

## 2020-09-04 MED ORDER — METFORMIN HCL 500 MG PO TABS: ORAL_TABLET | ORAL | 2 refills | Status: DC

## 2020-09-04 NOTE — Progress Notes (Signed)
Established Patient Office Visit  Subjective:  Patient ID: Jacob Harrison, male    DOB: [birth year]  Age: 80 y.o. MRN: [id number]  CC:  Chief Complaint  Patient presents with  . Follow-up  . Diabetes  . Hypertension    HPI Jacob Harrison presents for follow up on diabetes mellitus and hypertension.  Patient reports having indigestion which usually wakes him up at night 2 or 3 times per week.  Takes Rolaids for symptoms which provide mild to moderate relief.  Reports is drinking more water and watching his diet so unsure what is triggering heartburn symptoms. Denies hemoptysis, nausea, vomiting or abdominal pain. No prior history of stomach ulcers and avoids NSAIDs.   Diabetes: Pt denies increased urination or thirst. Pt reports medication compliance. No hypoglycemic events. Checking glucose at home.  Reports fasting blood sugars range from 105-130s.  Continues to monitor carbohydrate and glucose intake.  States has completely stopped drinking alcohol.  HTN: Pt denies chest pain, palpitations, dizziness, orthopnea or increased lower extremity swelling from baseline.  Does report chest tightness with gardening and yard work which improves after resting.  Reports his symptoms last more than 10 minutes will take a nitroglycerin which he has taken 3-4 times per week.  States usually takes the nitroglycerin 3-4 times per month.  Has an appointment scheduled with his cardiologist. Taking medication as directed without side effects. Checks BP at home and readings range 130-140/65-75.  Does report an elevated blood pressure reading in the morning of 177/80 with pulse 93 and later rechecked it which improved to 150/75 with pulse 77.  Denies any recurrence.  Pt follows a low salt diet and is staying hydrated.  HLD: Pt taking medication as directed without issues. Continues to monitor fat intake.     Past Medical History:  Diagnosis Date  . (HFpEF) heart failure with preserved ejection  fraction ([place])    a. 10/2016 Echo: EF 65-70%, mild LVH, mildly dil LA. RV fxn nl.  . Arthritis   . BPH (benign prostatic hyperplasia)   . Coronary artery disease    a. 2001 CABG x 3 [facility]): LIMA->LAD, VG->LCX, VG->RPDA; b. 04/05/2010 Cath ([place]): LM 76m LAD 1020mLCX 4035mCA 100p, LIMA->LAD atretic, VG->LCX 100, VG->RCA 80ost, 70d; c. 04/2010 Redo CABG x 2 ([place]): VG->LAD, VG->RPDA; d. 10/2012 Cath ([facility] 20-30, LAD 100/95-39m60mX 40-86m,22m 100p, 80-100d, RPDA 99, RPL1 95, VG->LAD ok, VG->RPDA ok, EF 67%; e. 11/2016 MV: EF 48%, No ischemia.  . 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 Hyperlipidemia   . Hypertension   . Scoliosis   . Spinal stenosis     Past Surgical History:  Procedure Laterality Date  . CARDIAC CATHETERIZATION    . CORONARY ARTERY BYPASS GRAFT     x 2 (2001 and redo in late 2011 or early 2012)  . HERNIA REPAIR Left    inguinal  . LUMBAR LAMINECTOMY/DECOMPRESSION MICRODISCECTOMY N/A 10/20/2018   Procedure: L3-4, L4-5 LUMBAR DECOMPRESSION with dural repair.;  Surgeon: YatesMarybelle Killings  [facility];  Laterality: N/A;  . TONSILLECTOMY AND ADENOIDECTOMY    . TRANSURETHRAL RESECTION OF PROSTATE  1990    Family History  Problem Relation Age of Onset  . Diabetes Mother   . Hyperlipidemia Mother   . Cancer Father        colon  . Alcohol abuse Maternal Uncle   . Diabetes Maternal Uncle   . Hyperlipidemia Maternal  Uncle   . Cancer Paternal Uncle        colon    Social History   Socioeconomic History  . Marital status: Married    Spouse name: Not on file  . Number of children: Not on file  . Years of education: Not on file  . Highest education level: Not on file  Occupational History  . Occupation: retired  Tobacco Use  . Smoking status: Never Smoker  . Smokeless tobacco: Never Used  Vaping Use  . Vaping Use: Never used  Substance and Sexual Activity  . Alcohol use: Yes    Alcohol/week: 11.0  standard drinks    Types: 5 Glasses of wine, 6 Cans of beer per week    Comment: 5-7 drinks a week (was 10-15 drinks/week)  . Drug use: No  . Sexual activity: Not Currently    Birth control/protection: None  Other Topics Concern  . Not on file  Social History Narrative  . Not on file   Social Determinants of Health   Financial Resource Strain: Not on file  Food Insecurity: Not on file  Transportation Needs: Not on file  Physical Activity: Not on file  Stress: Not on file  Social Connections: Not on file  Intimate Partner Violence: Not on file    Outpatient Medications Prior to Visit  Medication Sig Dispense Refill  . amLODipine (NORVASC) 5 MG tablet TAKE 1 TABLET BY MOUTH EVERY DAY 90 tablet 1  . aspirin EC 81 MG tablet Take 1 tablet (81 mg total) by mouth daily.    . carvedilol (COREG) 3.125 MG tablet TAKE 1 TABLET (3.125 MG TOTAL) BY MOUTH 2 (TWO) TIMES DAILY WITH A MEAL. 180 tablet 0  . Cholecalciferol (VITAMIN D-3 PO) Take 800 Units by mouth daily.    . Coenzyme Q10 (CO Q-10) 200 MG CAPS Take 200 mg by mouth daily.     . cyanocobalamin 500 MCG tablet Take 500 mcg by mouth daily.    . diclofenac Sodium (VOLTAREN) 1 % GEL Apply 2 g topically 2 (two) times daily as needed. 120 g 0  . ezetimibe (ZETIA) 10 MG tablet TAKE 1 TABLET BY MOUTH EVERY DAY 90 tablet 0  . finasteride (PROSCAR) 5 MG tablet TAKE 1 TABLET BY MOUTH EVERY DAY 90 tablet 0  . Glucosamine-Chondroit-Vit C-Mn (GLUCOSAMINE 1500 COMPLEX) CAPS Take 1 capsule by mouth daily.    . Lancets (ONETOUCH ULTRASOFT) lancets Use to check blood sugars fasting daily 100 each 12  . Multiple Vitamins-Minerals (PRESERVISION AREDS 2 PO) Take 1 capsule by mouth 2 (two) times a day.    . nitroGLYCERIN (NITROSTAT) 0.4 MG SL tablet Place 1 tablet (0.4 mg total) under the tongue as needed. 25 tablet 3  . ranolazine (RANEXA) 1000 MG SR tablet Take 1 tablet by mouth twice daily 180 tablet 3  . rosuvastatin (CRESTOR) 20 MG tablet TAKE 1  TABLET BY MOUTH EVERYDAY AT BEDTIME 90 tablet 0  . metFORMIN (GLUCOPHAGE) 500 MG tablet 1/2 tablet by mouth daily with food 90 tablet 2  . furosemide (LASIX) 40 MG tablet Take 1 tablet (40 mg total) by mouth daily. 90 tablet 2   Facility-Administered Medications Prior to Visit  Medication Dose Route Frequency Provider Last Rate Last Admin  . ipratropium-albuterol (DUONEB) 0.5-2.5 (3) MG/3ML nebulizer solution 3 mL  3 mL Nebulization Q6H Danford, Katy D, NP   3 mL at 07/21/17 1646    Allergies  Allergen Reactions  . Other Itching and Rash  .  Isosorbide Mononitrate [Isosorbide Dinitrate Er]     Headaches  . Tetracyclines & Related Rash    ROS Review of Systems A fourteen system review of systems was performed and found to be positive as per HPI.    Objective:    Physical Exam General:  Well Developed, well nourished, in no acute distress Neuro:  Alert and oriented,  extra-ocular muscles intact  HEENT:  Normocephalic, atraumatic, neck supple Skin:  no gross rash, warm, pink. Cardiac:  RRR, S1 S2, +murmur  Respiratory:  ECTA B/L w/o wheezing, Not using accessory muscles, speaking in full sentences- unlabored. Vascular:  Ext warm, no cyanosis apprec.; cap RF less 2 sec. Trace edema b/l Psych:  No HI/SI, judgement and insight good, Euthymic mood. Full Affect.  BP 118/69   Pulse 78   Temp 99.1 F (37.3 C)   Ht '5\' 7"'  (1.702 m)   Wt 196 lb 11.2 oz (89.2 kg)   SpO2 96%   BMI 30.81 kg/m  Wt Readings from Last 3 Encounters:  09/04/20 196 lb 11.2 oz (89.2 kg)  07/12/20 203 lb (92.1 kg)  07/03/20 208 lb (94.3 kg)     Health Maintenance Due  Topic Date Due  . Pneumococcal Vaccine 31-79 Years old (1 of 4 - PCV13) Never done  . Hepatitis C Screening  Never done  . Zoster Vaccines- Shingrix (1 of 2) Never done  . COVID-19 Vaccine (3 - Pfizer risk 4-dose series) 06/29/2019    There are no preventive care reminders to display for this patient.  Lab Results  Component Value  Date   TSH 2.180 08/31/2020   Lab Results  Component Value Date   WBC 10.4 08/31/2020   HGB 13.8 08/31/2020   HCT 41.3 08/31/2020   MCV 95 08/31/2020   PLT 237 08/31/2020   Lab Results  Component Value Date   NA 137 08/31/2020   K 4.3 08/31/2020   CO2 22 08/31/2020   GLUCOSE 102 (H) 08/31/2020   BUN 8 08/31/2020   CREATININE 0.81 08/31/2020   BILITOT 0.6 08/31/2020   ALKPHOS 90 08/31/2020   AST 17 08/31/2020   ALT 19 08/31/2020   PROT 6.7 08/31/2020   ALBUMIN 4.9 (H) 08/31/2020   CALCIUM 9.8 08/31/2020   ANIONGAP 10 06/21/2020   EGFR 91 08/31/2020   Lab Results  Component Value Date   CHOL 140 08/31/2020   Lab Results  Component Value Date   HDL 50 08/31/2020   Lab Results  Component Value Date   LDLCALC 63 08/31/2020   Lab Results  Component Value Date   TRIG 156 (H) 08/31/2020   Lab Results  Component Value Date   CHOLHDL 2.8 08/31/2020   Lab Results  Component Value Date   HGBA1C 6.1 (H) 08/31/2020      Assessment & Plan:   Problem List Items Addressed This Visit      Cardiovascular and Mediastinum   Coronary artery disease of native artery of native heart with stable angina pectoris ([place])     Endocrine   Hyperlipidemia associated with type 2 diabetes mellitus ([place]) (Chronic)   Relevant Medications   metFORMIN (GLUCOPHAGE) 500 MG tablet    Other Visit Diagnoses    Diabetes mellitus with complication ([place])    -  Primary   Relevant Medications   metFORMIN (GLUCOPHAGE) 500 MG tablet   Hypertension associated with diabetes ([place])       Relevant Medications   metFORMIN (GLUCOPHAGE) 500 MG tablet   Indigestion  CAD of native artery of native heart with stable angina pectoris: -Recommend to follow up with Cardiology as scheduled.  -Continue current medication regimen.  Diabetes mellitus with complication: -Recent M4Q 6.1, stable. -Continue current medication regimen. -Recommend to continue low carbohydrate and glucose intake.   -Will continue to monitor.  Hyperlipidemia associated with type 2 diabetes mellitus: -Recent lipid panel: total cholesterol 140, triglycerides 156, HDL 50, LDL 63 (at goal <70). -Continue current medication regimen. -Recommend to follow a heart healthy diet and monitor simple carbohydrates. Continue to alcohol cessation. -Will continue to monitor.  Hypertension associated with diabetes: -BP today is wnl's. -Continue current medication regimen. -Recent CMP,renal function and electrolytes normal. -Will continue to monitor.  Indigestion: -Discussed with patient recommend to take H2 blocker such as famotidine instead of rolaids. Advised to avoid eating late and can elevate the head of the bed. -Discussed potentially heartburn symptoms contributing to some of his chest discomfort/tightness vs cardiac.  -Follow up if symptoms fail to improve or worsen.   Discussed recent labs. CBC- WBC normalized, TSH is normal.  Meds ordered this encounter  Medications  . metFORMIN (GLUCOPHAGE) 500 MG tablet    Sig: 1/2 tablet by mouth daily with food    Dispense:  90 tablet    Refill:  2    Order Specific Question:   Supervising Provider    Answer:   Beatrice Lecher D [[id number]]    Follow-up: Return in about 4 months (around 01/04/2021) for DM, HTN, HLD.   Note:  This note was prepared with assistance of Dragon voice recognition software. Occasional wrong-word or sound-a-like substitutions may have occurred due to the inherent limitations of voice recognition software.  [name], PA-C

## 2020-09-04 NOTE — Patient Instructions (Signed)

## 2020-09-05 DIAGNOSIS — R079 Chest pain, unspecified: Secondary | ICD-10-CM | POA: Insufficient documentation

## 2020-09-11 DIAGNOSIS — H2513 Age-related nuclear cataract, bilateral: Secondary | ICD-10-CM | POA: Diagnosis not present

## 2020-09-11 DIAGNOSIS — H35373 Puckering of macula, bilateral: Secondary | ICD-10-CM | POA: Diagnosis not present

## 2020-10-01 NOTE — Progress Notes (Signed)
Follow-up Outpatient Visit Date: 10/03/2020  Primary Care Provider: [name], PA-C [place] [id number]  Chief Complaint: Chest tightness  HPI:  Jacob Harrison is a 80 y.o. 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  I last saw him in February, which time he was doing well with no significant change in his chronic exertional dyspnea that has been present for years.  He previously noted improvement in his shortness of breath with addition of furosemide.  We agreed to increase furosemide to 40 mg daily at our last visit.  He contacted [place] due to some dizziness after making this medication change, particularly when leaning his head back.  We discussed going back to 20 mg daily, though Mr. Granlund has remained on the 40 mg daily dose.  He has undergone neurologic evaluation for potential causes of his dizziness without clear etiology.  Today, Mr. Seres reports that he has been experiencing more frequent chest tightness over the last month.  He initially tried an antacid without any relief but found that nitroglycerin usually makes the pain go away quite quickly.  He is now using nitroglycerin 3-5 times per day, often having pain with mild activity.  Over the last week, he has had 2 episodes of chest tightness when lying down at night, both of which resolved with sublingual nitroglycerin.  He is currently chest pain-free but had to take nitroglycerin as he was walking to the office today.  He denies shortness of breath, palpitations, and lightheadedness.  Chronic lower extremity edema is unchanged from baseline.  --------------------------------------------------------------------------------------------------  Cardiovascular History & Procedures: Cardiovascular Problems: Coronary artery  disease Ischemic cardiomyopathy Heart failure with preserved ejection fraction (HFpEF)   Risk Factors: Known coronary artery disease, hypertension, hyperlipidemia, diabetes mellitus, male gender, obesity, and age > 42   Cath/PCI: LHC (10/18/12, [place], [place]): LMCA 20-30% ostial/proximal stenosis. LAD with sequential 100% and 95-99% mid stenoses. LCx with 40-50% mid stenosis. RCA with 100% proximal and 80-100% sequential distal lesions. RPDA and rPL1 have 99% and 95% stenoses, respectively. SVG->LAD and SVG->rPDA are patent. LVEF 67% with normal wall motion. LHC (04/05/10, [place], [place]): LMCA with 30% mid stenosis. LAD with 100% midvessel occlution. LCx with 40% mid stenosis. RCA with 100% proximal stenosis. LIMA->LAD atretic, SVG->LCx occluded, and SVG-> RCA with 80% ostial and 70% distal stenoses.   CV Surgery: Redo CABG (04/2010, [place], ): SVG->LAD and SVG->rPDA. CABG (2001, [place]): LIMA->LAD, SVG->LCx, and SVG->rPDA   EP Procedures and Devices: None   Non-Invasive Evaluation(s): TTE (09/23/2019): Normal LV size and wall thickness.  LVEF 60-65% with grade 1 diastolic dysfunction.  Normal RV size and function.  No atrial enlargement.  Aortic sclerosis without stenosis.  Normal CVP. Pharmacologic MPI (11/06/16): Low risk study without ischemia or scar. LVEF 48% (calculated) but visually appears normal. TTE (10/21/16): Normal LV size with mild LVH. LVEF 65-70% with normal diastolic function. Mild LA enlargement. Normal RV size and function. ABIs (03/08/15): Right 1.25, left 1.26; no significant change with exercise. Exercise myocardial perfusion stress test (10/07/12): Small to moderate in size, mild to moderate in severity, partially reversible defect involving the mid inferior and inferolateral segments. LVEF 65%.  Recent CV Pertinent Labs: Lab Results  Component Value Date   CHOL 140 08/31/2020   HDL 50 08/31/2020  LDLCALC 63 08/31/2020   TRIG 156  (H) 08/31/2020   CHOLHDL 2.8 08/31/2020   CHOLHDL 2.7 11/14/2016   K 4.3 08/31/2020   MG 1.9 02/25/2019   BUN 8 08/31/2020   CREATININE 0.81 08/31/2020    Past medical and surgical history were reviewed and updated in EPIC.  Current Meds  Medication Sig   amLODipine (NORVASC) 5 MG tablet TAKE 1 TABLET BY MOUTH EVERY DAY   aspirin EC 81 MG tablet Take 1 tablet (81 mg total) by mouth daily.   carvedilol (COREG) 3.125 MG tablet TAKE 1 TABLET (3.125 MG TOTAL) BY MOUTH 2 (TWO) TIMES DAILY WITH A MEAL.   Cholecalciferol (VITAMIN D-3 PO) Take 800 Units by mouth daily.   Coenzyme Q10 (CO Q-10) 200 MG CAPS Take 200 mg by mouth daily.    cyanocobalamin 500 MCG tablet Take 500 mcg by mouth daily.   diclofenac Sodium (VOLTAREN) 1 % GEL Apply 2 g topically 2 (two) times daily as needed.   ezetimibe (ZETIA) 10 MG tablet TAKE 1 TABLET BY MOUTH EVERY DAY   finasteride (PROSCAR) 5 MG tablet TAKE 1 TABLET BY MOUTH EVERY DAY   Glucosamine-Chondroit-Vit C-Mn (GLUCOSAMINE 1500 COMPLEX) CAPS Take 1 capsule by mouth daily.   Lancets (ONETOUCH ULTRASOFT) lancets Use to check blood sugars fasting daily   metFORMIN (GLUCOPHAGE) 500 MG tablet 1/2 tablet by mouth daily with food   Multiple Vitamins-Minerals (PRESERVISION AREDS 2 PO) Take 1 capsule by mouth 2 (two) times a day.   nitroGLYCERIN (NITROSTAT) 0.4 MG SL tablet Place 1 tablet (0.4 mg total) under the tongue as needed.   ranolazine (RANEXA) 1000 MG SR tablet Take 1 tablet by mouth twice daily   rosuvastatin (CRESTOR) 20 MG tablet TAKE 1 TABLET BY MOUTH EVERYDAY AT BEDTIME   Current Facility-Administered Medications for the 10/03/20 encounter (Office Visit) with Karol Skarzynski, Harrell Gave, MD  Medication   ipratropium-albuterol (DUONEB) 0.5-2.5 (3) MG/3ML nebulizer solution 3 mL    Allergies: Other, Isosorbide mononitrate [isosorbide dinitrate er], and Tetracyclines & related  Social History   Tobacco Use   Smoking status: Never   Smokeless tobacco: Never   Vaping Use   Vaping Use: Never used  Substance Use Topics   Alcohol use: Not Currently    Alcohol/week: 11.0 standard drinks    Types: 5 Glasses of wine, 6 Cans of beer per week    Comment: 5-7 drinks a week (was 10-15 drinks/week)   Drug use: No    Family History  Problem Relation Age of Onset   Diabetes Mother    Hyperlipidemia Mother    Cancer Father        colon   Alcohol abuse Maternal Uncle    Diabetes Maternal Uncle    Hyperlipidemia Maternal Uncle    Cancer Paternal Uncle        colon    Review of Systems: A 12-system review of systems was performed and was negative except as noted in the HPI.  --------------------------------------------------------------------------------------------------  Physical Exam: BP 132/80 (BP Location: Left Arm, Patient Position: Sitting, Cuff Size: Large)   Pulse 68   Ht 5\' 7"  (1.702 m)   Wt 188 lb (85.3 kg)   SpO2 97%   BMI 29.44 kg/m   General:  NAD. Neck: No JVD or HJR. Lungs: Clear to auscultation bilaterally without wheezes or crackles. Heart: Regular rate and rhythm with 2/6 systolic murmur. Abdomen: Soft, nontender, nondistended. Extremities: 1+ bilateral ankle edema.  EKG: Normal sinus rhythm with inferior infarct and nonspecific  T wave abnormality.  Compared with prior tracing from 05/30/2020, nonspecific T wave abnormality is now present.  Lab Results  Component Value Date   WBC 10.4 08/31/2020   HGB 13.8 08/31/2020   HCT 41.3 08/31/2020   MCV 95 08/31/2020   PLT 237 08/31/2020    Lab Results  Component Value Date   NA 137 08/31/2020   K 4.3 08/31/2020   CL 96 08/31/2020   CO2 22 08/31/2020   BUN 8 08/31/2020   CREATININE 0.81 08/31/2020   GLUCOSE 102 (H) 08/31/2020   ALT 19 08/31/2020    Lab Results  Component Value Date   CHOL 140 08/31/2020   HDL 50 08/31/2020   LDLCALC 63 08/31/2020   TRIG 156 (H) 08/31/2020   CHOLHDL 2.8 08/31/2020     --------------------------------------------------------------------------------------------------  ASSESSMENT AND PLAN: Unstable angina: Mr. Vanhoesen reports a 1 month history of progressive chest tightness, usually present with exertion but also noted at least twice when lying down at night.  Pain resolves promptly with a single sublingual nitroglycerin tablet.  He is currently asymptomatic.  We discussed escalation of his current antianginal regimen, though he has been intolerant of isosorbide mononitrate due to headaches in the past.  He also had significant bradycardia with higher doses of carvedilol.  We have discussed further evaluation options and have agreed to proceed with cardiac catheterization and possible PCI tomorrow.  We will defer medication changes today.  Chronic HFpEF: Mr. Amescua has stable NYHA class II symptoms with mild ankle edema that is similar to our last visit despite escalation of furosemide.  We will defer medication changes pending upcoming catheterization.  Hypertension: BP borderline elevated today (goal < 138/80).  Defer medication changes today pending catheterization.  Hyperlipidemia associated with type 2 diabetes mellitus: LDL at goal on last check a month ago.  Triglycerides mildly elevated.  Continue rosuvastatin and ezetimibe.  Shared Decision Making/Informed Consent{ The risks [stroke (1 in 1000), death (1 in 1000), kidney failure [usually temporary] (1 in 500), bleeding (1 in 200), allergic reaction [possibly serious] (1 in 200)], benefits (diagnostic support and management of coronary artery disease) and alternatives of a cardiac catheterization were discussed in detail with Mr. Futch and he is willing to proceed.  Follow-up: Return to clinic in 2 weeks.  Nelva Bush, MD 10/03/2020 11:38 AM

## 2020-10-01 NOTE — H&P (View-Only) (Signed)
Follow-up Outpatient Visit Date: 10/03/2020  Primary Care Provider: [name], PA-C [place] [id number]  Chief Complaint: Chest tightness  HPI:  Mr. Muniz is a 80 y.o. 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  I last saw him in February, which time he was doing well with no significant change in his chronic exertional dyspnea that has been present for years.  He previously noted improvement in his shortness of breath with addition of furosemide.  We agreed to increase furosemide to 40 mg daily at our last visit.  He contacted [place] due to some dizziness after making this medication change, particularly when leaning his head back.  We discussed going back to 20 mg daily, though Mr. Wurtz has remained on the 40 mg daily dose.  He has undergone neurologic evaluation for potential causes of his dizziness without clear etiology.  Today, Mr. Deiss reports that he has been experiencing more frequent chest tightness over the last month.  He initially tried an antacid without any relief but found that nitroglycerin usually makes the pain go away quite quickly.  He is now using nitroglycerin 3-5 times per day, often having pain with mild activity.  Over the last week, he has had 2 episodes of chest tightness when lying down at night, both of which resolved with sublingual nitroglycerin.  He is currently chest pain-free but had to take nitroglycerin as he was walking to the office today.  He denies shortness of breath, palpitations, and lightheadedness.  Chronic lower extremity edema is unchanged from baseline.  --------------------------------------------------------------------------------------------------  Cardiovascular History & Procedures: Cardiovascular Problems: Coronary artery  disease Ischemic cardiomyopathy Heart failure with preserved ejection fraction (HFpEF)   Risk Factors: Known coronary artery disease, hypertension, hyperlipidemia, diabetes mellitus, male gender, obesity, and age > 31   Cath/PCI: LHC (10/18/12, [place], [place]): LMCA 20-30% ostial/proximal stenosis. LAD with sequential 100% and 95-99% mid stenoses. LCx with 40-50% mid stenosis. RCA with 100% proximal and 80-100% sequential distal lesions. RPDA and rPL1 have 99% and 95% stenoses, respectively. SVG->LAD and SVG->rPDA are patent. LVEF 67% with normal wall motion. LHC (04/05/10, [place], [place]): LMCA with 30% mid stenosis. LAD with 100% midvessel occlution. LCx with 40% mid stenosis. RCA with 100% proximal stenosis. LIMA->LAD atretic, SVG->LCx occluded, and SVG-> RCA with 80% ostial and 70% distal stenoses.   CV Surgery: Redo CABG (04/2010, [place], [place]): SVG->LAD and SVG->rPDA. CABG (2001, [place]): LIMA->LAD, SVG->LCx, and SVG->rPDA   EP Procedures and Devices: None   Non-Invasive Evaluation(s): TTE (09/23/2019): Normal LV size and wall thickness.  LVEF 60-65% with grade 1 diastolic dysfunction.  Normal RV size and function.  No atrial enlargement.  Aortic sclerosis without stenosis.  Normal CVP. Pharmacologic MPI (11/06/16): Low risk study without ischemia or scar. LVEF 48% (calculated) but visually appears normal. TTE (10/21/16): Normal LV size with mild LVH. LVEF 65-70% with normal diastolic function. Mild LA enlargement. Normal RV size and function. ABIs (03/08/15): Right 1.25, left 1.26; no significant change with exercise. Exercise myocardial perfusion stress test (10/07/12): Small to moderate in size, mild to moderate in severity, partially reversible defect involving the mid inferior and inferolateral segments. LVEF 65%.  Recent CV Pertinent Labs: Lab Results  Component Value Date   CHOL 140 08/31/2020   HDL 50 08/31/2020  LDLCALC 63 08/31/2020   TRIG 156  (H) 08/31/2020   CHOLHDL 2.8 08/31/2020   CHOLHDL 2.7 11/14/2016   K 4.3 08/31/2020   MG 1.9 02/25/2019   BUN 8 08/31/2020   CREATININE 0.81 08/31/2020    Past medical and surgical history were reviewed and updated in EPIC.  Current Meds  Medication Sig   amLODipine (NORVASC) 5 MG tablet TAKE 1 TABLET BY MOUTH EVERY DAY   aspirin EC 81 MG tablet Take 1 tablet (81 mg total) by mouth daily.   carvedilol (COREG) 3.125 MG tablet TAKE 1 TABLET (3.125 MG TOTAL) BY MOUTH 2 (TWO) TIMES DAILY WITH A MEAL.   Cholecalciferol (VITAMIN D-3 PO) Take 800 Units by mouth daily.   Coenzyme Q10 (CO Q-10) 200 MG CAPS Take 200 mg by mouth daily.    cyanocobalamin 500 MCG tablet Take 500 mcg by mouth daily.   diclofenac Sodium (VOLTAREN) 1 % GEL Apply 2 g topically 2 (two) times daily as needed.   ezetimibe (ZETIA) 10 MG tablet TAKE 1 TABLET BY MOUTH EVERY DAY   finasteride (PROSCAR) 5 MG tablet TAKE 1 TABLET BY MOUTH EVERY DAY   Glucosamine-Chondroit-Vit C-Mn (GLUCOSAMINE 1500 COMPLEX) CAPS Take 1 capsule by mouth daily.   Lancets (ONETOUCH ULTRASOFT) lancets Use to check blood sugars fasting daily   metFORMIN (GLUCOPHAGE) 500 MG tablet 1/2 tablet by mouth daily with food   Multiple Vitamins-Minerals (PRESERVISION AREDS 2 PO) Take 1 capsule by mouth 2 (two) times a day.   nitroGLYCERIN (NITROSTAT) 0.4 MG SL tablet Place 1 tablet (0.4 mg total) under the tongue as needed.   ranolazine (RANEXA) 1000 MG SR tablet Take 1 tablet by mouth twice daily   rosuvastatin (CRESTOR) 20 MG tablet TAKE 1 TABLET BY MOUTH EVERYDAY AT BEDTIME   Current Facility-Administered Medications for the 10/03/20 encounter (Office Visit) with Yuriko Portales, Harrell Gave, MD  Medication   ipratropium-albuterol (DUONEB) 0.5-2.5 (3) MG/3ML nebulizer solution 3 mL    Allergies: Other, Isosorbide mononitrate [isosorbide dinitrate er], and Tetracyclines & related  Social History   Tobacco Use   Smoking status: Never   Smokeless tobacco: Never   Vaping Use   Vaping Use: Never used  Substance Use Topics   Alcohol use: Not Currently    Alcohol/week: 11.0 standard drinks    Types: 5 Glasses of wine, 6 Cans of beer per week    Comment: 5-7 drinks a week (was 10-15 drinks/week)   Drug use: No    Family History  Problem Relation Age of Onset   Diabetes Mother    Hyperlipidemia Mother    Cancer Father        colon   Alcohol abuse Maternal Uncle    Diabetes Maternal Uncle    Hyperlipidemia Maternal Uncle    Cancer Paternal Uncle        colon    Review of Systems: A 12-system review of systems was performed and was negative except as noted in the HPI.  --------------------------------------------------------------------------------------------------  Physical Exam: BP 132/80 (BP Location: Left Arm, Patient Position: Sitting, Cuff Size: Large)   Pulse 68   Ht 5\' 7"  (1.702 m)   Wt 188 lb (85.3 kg)   SpO2 97%   BMI 29.44 kg/m   General:  NAD. Neck: No JVD or HJR. Lungs: Clear to auscultation bilaterally without wheezes or crackles. Heart: Regular rate and rhythm with 2/6 systolic murmur. Abdomen: Soft, nontender, nondistended. Extremities: 1+ bilateral ankle edema.  EKG: Normal sinus rhythm with inferior infarct and nonspecific  T wave abnormality.  Compared with prior tracing from 05/30/2020, nonspecific T wave abnormality is now present.  Lab Results  Component Value Date   WBC 10.4 08/31/2020   HGB 13.8 08/31/2020   HCT 41.3 08/31/2020   MCV 95 08/31/2020   PLT 237 08/31/2020    Lab Results  Component Value Date   NA 137 08/31/2020   K 4.3 08/31/2020   CL 96 08/31/2020   CO2 22 08/31/2020   BUN 8 08/31/2020   CREATININE 0.81 08/31/2020   GLUCOSE 102 (H) 08/31/2020   ALT 19 08/31/2020    Lab Results  Component Value Date   CHOL 140 08/31/2020   HDL 50 08/31/2020   LDLCALC 63 08/31/2020   TRIG 156 (H) 08/31/2020   CHOLHDL 2.8 08/31/2020     --------------------------------------------------------------------------------------------------  ASSESSMENT AND PLAN: Unstable angina: Mr. Ambrosini reports a 1 month history of progressive chest tightness, usually present with exertion but also noted at least twice when lying down at night.  Pain resolves promptly with a single sublingual nitroglycerin tablet.  He is currently asymptomatic.  We discussed escalation of his current antianginal regimen, though he has been intolerant of isosorbide mononitrate due to headaches in the past.  He also had significant bradycardia with higher doses of carvedilol.  We have discussed further evaluation options and have agreed to proceed with cardiac catheterization and possible PCI tomorrow.  We will defer medication changes today.  Chronic HFpEF: Mr. Rathje has stable NYHA class II symptoms with mild ankle edema that is similar to our last visit despite escalation of furosemide.  We will defer medication changes pending upcoming catheterization.  Hypertension: BP borderline elevated today (goal < 138/80).  Defer medication changes today pending catheterization.  Hyperlipidemia associated with type 2 diabetes mellitus: LDL at goal on last check a month ago.  Triglycerides mildly elevated.  Continue rosuvastatin and ezetimibe.  Shared Decision Making/Informed Consent{ The risks [stroke (1 in 1000), death (1 in 1000), kidney failure [usually temporary] (1 in 500), bleeding (1 in 200), allergic reaction [possibly serious] (1 in 200)], benefits (diagnostic support and management of coronary artery disease) and alternatives of a cardiac catheterization were discussed in detail with Mr. Halbig and he is willing to proceed.  Follow-up: Return to clinic in 2 weeks.  Nelva Bush, MD 10/03/2020 11:38 AM

## 2020-10-03 ENCOUNTER — Ambulatory Visit: Payer: Medicare Other | Admitting: Internal Medicine

## 2020-10-03 ENCOUNTER — Encounter: Payer: Self-pay | Admitting: Internal Medicine

## 2020-10-03 ENCOUNTER — Other Ambulatory Visit
Admission: RE | Admit: 2020-10-03 | Discharge: 2020-10-03 | Disposition: A | Discharge status: Home / Self Care | Payer: Medicare Other | Attending: Internal Medicine | Admitting: Internal Medicine

## 2020-10-03 ENCOUNTER — Other Ambulatory Visit: Payer: Self-pay

## 2020-10-03 VITALS — BP 132/80 | HR 68 | Ht 67.0 in | Wt 188.0 lb

## 2020-10-03 DIAGNOSIS — I200 Unstable angina: Secondary | ICD-10-CM | POA: Diagnosis not present

## 2020-10-03 DIAGNOSIS — I10 Essential (primary) hypertension: Secondary | ICD-10-CM

## 2020-10-03 DIAGNOSIS — I5032 Chronic diastolic (congestive) heart failure: Secondary | ICD-10-CM

## 2020-10-03 DIAGNOSIS — E1169 Type 2 diabetes mellitus with other specified complication: Secondary | ICD-10-CM | POA: Diagnosis not present

## 2020-10-03 DIAGNOSIS — E785 Hyperlipidemia, unspecified: Secondary | ICD-10-CM

## 2020-10-03 LAB — BASIC METABOLIC PANEL
Anion gap: 12 (ref 5–15)
BUN: 12 mg/dL (ref 8–23)
CO2: 25 mmol/L (ref 22–32)
Calcium: 10.1 mg/dL (ref 8.9–10.3)
Chloride: 99 mmol/L (ref 98–111)
Creatinine, Ser: 0.91 mg/dL (ref 0.61–1.24)
GFR, Estimated: >60 mL/min (ref >60)
Glucose, Bld: 115 mg/dL — ABNORMAL HIGH (ref 70–99)
Potassium: 4.1 mmol/L (ref 3.5–5.1)
Sodium: 136 mmol/L (ref 135–145)

## 2020-10-03 LAB — CBC
HCT: 40.4 % (ref 39.0–52.0)
Hemoglobin: 14.3 g/dL (ref 13.0–17.0)
MCH: 32.1 pg (ref 26.0–34.0)
MCHC: 35.4 g/dL (ref 30.0–36.0)
MCV: 90.8 fL (ref 80.0–100.0)
Platelets: 264 10*3/uL (ref 150–400)
RBC: 4.45 MIL/uL (ref 4.22–5.81)
RDW: 13.3 % (ref 11.5–15.5)
WBC: 14.5 10*3/uL — ABNORMAL HIGH (ref 4.0–10.5)
nRBC: 0 % (ref 0.0–0.2)

## 2020-10-03 MED ORDER — NITROGLYCERIN 0.4 MG SL SUBL: 0.4000 mg | SUBLINGUAL_TABLET | SUBLINGUAL | 3 refills | Status: DC | PRN

## 2020-10-03 NOTE — Patient Instructions (Signed)
Medication Instructions:   Your physician recommends that you continue on your current medications as directed. Please refer to the Current Medication list given to you today.  We have sent in refill of your sublingual Nitroglycerin today  *If you need a refill on your cardiac medications before your next appointment, please call your pharmacy*   Lab Work:  -  Today at the [place] at [facility]: BMET, CBC -  Please go to the [place] will check in at the front desk to the right as you walk into the atrium.   Testing/Procedures:   You are scheduled for a Cardiac Catheterization on Thursday, June 30 with Dr. Glenetta Hew.  1. Please arrive at the [facility] (Main Entrance A) at [facility]: [address] [place], [place] [address] at 8:30 AM (This time is two hours before your procedure to ensure your preparation). Free valet parking service is available.   Special note: Every effort is made to have your procedure done on time. Please understand that emergencies sometimes delay scheduled procedures.  2. Diet: Do not eat solid foods after midnight.  The patient may have clear liquids until 5am upon the day of the procedure.  3. Labs: Today at the [place] at [facility], CBC)  4. Medication instructions in preparation for your procedure:   Contrast Allergy: No    - Do Not take Furosemide morning of procedure    -  Do not take Diabetes Med Glucophage (Metformin) on the day of the procedure and HOLD 48 HOURS AFTER THE PROCEDURE.  On the morning of your procedure, take your Aspirin and any morning medicines NOT listed above.  You may use sips of water.  5. Plan for one night stay--bring personal belongings. 6. Bring a current list of your medications and current insurance cards. 7. You MUST have a responsible person to drive you home. 8. Someone MUST be with you the first 24 hours after you arrive home or your discharge will be delayed. 9. Please  wear clothes that are easy to get on and off and wear slip-on shoes.  Thank you for allowing [place] to care for you!   -- [facility]    Follow-Up: At [facility], you and your health needs are our priority.  As part of our continuing mission to provide you with exceptional heart care, we have created designated Provider Care Teams.  These Care Teams include your primary Cardiologist (physician) and Advanced Practice Providers (APPs -  Physician Assistants and Nurse Practitioners) who all work together to provide you with the care you need, when you need it.  We recommend signing up for the patient portal called "MyChart".  Sign up information is provided on this After Visit Summary.  MyChart is used to connect with patients for Virtual Visits (Telemedicine).  Patients are able to view lab/test results, encounter notes, upcoming appointments, etc.  Non-urgent messages can be sent to your provider as well.   To learn more about what you can do with MyChart, go to [email or web address].    Your next appointment:   2 week(s)   The format for your next appointment:   In Person  Provider:   You may see Nelva Bush, MD or one of the following Advanced Practice Providers on your designated Care Team:   Murray Hodgkins, NP Christell Faith, PA-C Marrianne Mood, PA-C [place], [place] Laurann Montana, NP   Other Instructions  If you  continue to have chest pain that is not relieved by Nitroglycerin, call 911.

## 2020-10-04 ENCOUNTER — Encounter (HOSPITAL_COMMUNITY): Payer: Self-pay | Admitting: Cardiology

## 2020-10-04 ENCOUNTER — Encounter (HOSPITAL_COMMUNITY)
Admission: AD | Disposition: A | Discharge status: Home / Self Care | Payer: Self-pay | Source: Home / Self Care | Attending: Cardiology

## 2020-10-04 ENCOUNTER — Other Ambulatory Visit: Payer: Self-pay

## 2020-10-04 ENCOUNTER — Observation Stay (HOSPITAL_COMMUNITY): Payer: Medicare Other

## 2020-10-04 ENCOUNTER — Inpatient Hospital Stay (HOSPITAL_COMMUNITY)
Admission: AD | Admit: 2020-10-04 | Discharge: 2020-10-09 | DRG: 280 | Disposition: A | Discharge status: Home / Self Care | Payer: Medicare Other | Attending: Cardiovascular Disease | Admitting: Cardiovascular Disease

## 2020-10-04 DIAGNOSIS — I200 Unstable angina: Secondary | ICD-10-CM

## 2020-10-04 DIAGNOSIS — J929 Pleural plaque without asbestos: Secondary | ICD-10-CM | POA: Diagnosis not present

## 2020-10-04 DIAGNOSIS — R600 Localized edema: Secondary | ICD-10-CM | POA: Diagnosis not present

## 2020-10-04 DIAGNOSIS — I25110 Atherosclerotic heart disease of native coronary artery with unstable angina pectoris: Secondary | ICD-10-CM | POA: Diagnosis present

## 2020-10-04 DIAGNOSIS — I252 Old myocardial infarction: Secondary | ICD-10-CM | POA: Clinically undetermined

## 2020-10-04 DIAGNOSIS — R0902 Hypoxemia: Secondary | ICD-10-CM | POA: Diagnosis present

## 2020-10-04 DIAGNOSIS — I110 Hypertensive heart disease with heart failure: Secondary | ICD-10-CM | POA: Diagnosis not present

## 2020-10-04 DIAGNOSIS — Z833 Family history of diabetes mellitus: Secondary | ICD-10-CM

## 2020-10-04 DIAGNOSIS — Z881 Allergy status to other antibiotic agents status: Secondary | ICD-10-CM | POA: Diagnosis not present

## 2020-10-04 DIAGNOSIS — I509 Heart failure, unspecified: Secondary | ICD-10-CM | POA: Insufficient documentation

## 2020-10-04 DIAGNOSIS — D72829 Elevated white blood cell count, unspecified: Secondary | ICD-10-CM | POA: Diagnosis not present

## 2020-10-04 DIAGNOSIS — E876 Hypokalemia: Secondary | ICD-10-CM | POA: Clinically undetermined

## 2020-10-04 DIAGNOSIS — Z83438 Family history of other disorder of lipoprotein metabolism and other lipidemia: Secondary | ICD-10-CM

## 2020-10-04 DIAGNOSIS — I25730 Atherosclerosis of nonautologous biological coronary artery bypass graft(s) with unstable angina pectoris: Secondary | ICD-10-CM | POA: Diagnosis not present

## 2020-10-04 DIAGNOSIS — J984 Other disorders of lung: Secondary | ICD-10-CM | POA: Diagnosis not present

## 2020-10-04 DIAGNOSIS — I700 Atherosclerosis of aorta: Secondary | ICD-10-CM | POA: Diagnosis present

## 2020-10-04 DIAGNOSIS — Z888 Allergy status to other drugs, medicaments and biological substances status: Secondary | ICD-10-CM | POA: Diagnosis not present

## 2020-10-04 DIAGNOSIS — I25710 Atherosclerosis of autologous vein coronary artery bypass graft(s) with unstable angina pectoris: Secondary | ICD-10-CM | POA: Diagnosis not present

## 2020-10-04 DIAGNOSIS — I255 Ischemic cardiomyopathy: Secondary | ICD-10-CM | POA: Diagnosis not present

## 2020-10-04 DIAGNOSIS — Z951 Presence of aortocoronary bypass graft: Secondary | ICD-10-CM

## 2020-10-04 DIAGNOSIS — Z7984 Long term (current) use of oral hypoglycemic drugs: Secondary | ICD-10-CM

## 2020-10-04 DIAGNOSIS — I10 Essential (primary) hypertension: Secondary | ICD-10-CM | POA: Diagnosis present

## 2020-10-04 DIAGNOSIS — Z7982 Long term (current) use of aspirin: Secondary | ICD-10-CM | POA: Diagnosis not present

## 2020-10-04 DIAGNOSIS — M79606 Pain in leg, unspecified: Secondary | ICD-10-CM | POA: Diagnosis present

## 2020-10-04 DIAGNOSIS — E1169 Type 2 diabetes mellitus with other specified complication: Secondary | ICD-10-CM | POA: Diagnosis present

## 2020-10-04 DIAGNOSIS — Z79899 Other long term (current) drug therapy: Secondary | ICD-10-CM

## 2020-10-04 DIAGNOSIS — I5033 Acute on chronic diastolic (congestive) heart failure: Secondary | ICD-10-CM | POA: Diagnosis present

## 2020-10-04 DIAGNOSIS — I2510 Atherosclerotic heart disease of native coronary artery without angina pectoris: Secondary | ICD-10-CM | POA: Diagnosis present

## 2020-10-04 DIAGNOSIS — I214 Non-ST elevation (NSTEMI) myocardial infarction: Secondary | ICD-10-CM | POA: Diagnosis not present

## 2020-10-04 DIAGNOSIS — I25118 Atherosclerotic heart disease of native coronary artery with other forms of angina pectoris: Secondary | ICD-10-CM | POA: Diagnosis present

## 2020-10-04 DIAGNOSIS — G8929 Other chronic pain: Secondary | ICD-10-CM | POA: Diagnosis present

## 2020-10-04 DIAGNOSIS — E785 Hyperlipidemia, unspecified: Secondary | ICD-10-CM | POA: Diagnosis present

## 2020-10-04 DIAGNOSIS — R0789 Other chest pain: Secondary | ICD-10-CM | POA: Diagnosis present

## 2020-10-04 DIAGNOSIS — E119 Type 2 diabetes mellitus without complications: Secondary | ICD-10-CM | POA: Diagnosis not present

## 2020-10-04 DIAGNOSIS — Z9889 Other specified postprocedural states: Secondary | ICD-10-CM | POA: Diagnosis not present

## 2020-10-04 HISTORY — PX: LEFT HEART CATH AND CORS/GRAFTS ANGIOGRAPHY: CATH118250

## 2020-10-04 LAB — GLUCOSE, CAPILLARY: Glucose-Capillary: 125 mg/dL — ABNORMAL HIGH (ref 70–99)

## 2020-10-04 SURGERY — LEFT HEART CATH AND CORS/GRAFTS ANGIOGRAPHY
Anesthesia: LOCAL

## 2020-10-04 MED ORDER — MIDAZOLAM HCL 2 MG/2ML IJ SOLN
INTRAMUSCULAR | Status: DC | PRN
  Administered 2020-10-04: 1 mg via INTRAVENOUS

## 2020-10-04 MED ORDER — SODIUM CHLORIDE 0.9% FLUSH: 3.0000 mL | INTRAVENOUS | Status: DC | PRN

## 2020-10-04 MED ORDER — CARVEDILOL 3.125 MG PO TABS
3.1250 mg | ORAL_TABLET | Freq: Two times a day (BID) | ORAL | Status: DC
  Administered 2020-10-05 (×2): 3.125 mg via ORAL
  Filled 2020-10-04 (×3): qty 1

## 2020-10-04 MED ORDER — SODIUM CHLORIDE 0.9% FLUSH
3.0000 mL | Freq: Two times a day (BID) | INTRAVENOUS | Status: DC
  Administered 2020-10-05 – 2020-10-09 (×9): 3 mL via INTRAVENOUS

## 2020-10-04 MED ORDER — ONDANSETRON HCL 4 MG/2ML IJ SOLN
4.0000 mg | Freq: Four times a day (QID) | INTRAMUSCULAR | Status: DC | PRN
  Administered 2020-10-05 (×2): 4 mg via INTRAVENOUS
  Filled 2020-10-04 (×2): qty 2

## 2020-10-04 MED ORDER — FENTANYL CITRATE (PF) 100 MCG/2ML IJ SOLN
INTRAMUSCULAR | Status: DC | PRN
  Administered 2020-10-04: 50 ug via INTRAVENOUS
  Administered 2020-10-04: 25 ug via INTRAVENOUS

## 2020-10-04 MED ORDER — VERAPAMIL HCL 2.5 MG/ML IV SOLN
INTRAVENOUS | Status: DC | PRN
  Administered 2020-10-04: 10 mL via INTRA_ARTERIAL

## 2020-10-04 MED ORDER — RANOLAZINE ER 500 MG PO TB12
1000.0000 mg | ORAL_TABLET | Freq: Two times a day (BID) | ORAL | Status: DC
  Administered 2020-10-04 – 2020-10-09 (×10): 1000 mg via ORAL
  Filled 2020-10-04 (×10): qty 2

## 2020-10-04 MED ORDER — SODIUM CHLORIDE 0.9 % WEIGHT BASED INFUSION: 1.0000 mL/kg/h | INTRAVENOUS | Status: DC

## 2020-10-04 MED ORDER — ASPIRIN 81 MG PO CHEW
81.0000 mg | CHEWABLE_TABLET | ORAL | Status: DC
Start: 2020-10-05 — End: 2020-10-04

## 2020-10-04 MED ORDER — LABETALOL HCL 5 MG/ML IV SOLN: 10.0000 mg | INTRAVENOUS | Status: AC | PRN

## 2020-10-04 MED ORDER — FUROSEMIDE 10 MG/ML IJ SOLN
40.0000 mg | Freq: Once | INTRAMUSCULAR | Status: AC
  Administered 2020-10-04: 40 mg via INTRAVENOUS
  Filled 2020-10-04: qty 4

## 2020-10-04 MED ORDER — HYDRALAZINE HCL 20 MG/ML IJ SOLN: 10.0000 mg | INTRAMUSCULAR | Status: AC | PRN

## 2020-10-04 MED ORDER — ACETAMINOPHEN 325 MG PO TABS
650.0000 mg | ORAL_TABLET | ORAL | Status: DC | PRN
  Administered 2020-10-04 – 2020-10-05 (×3): 650 mg via ORAL
  Filled 2020-10-04 (×3): qty 2

## 2020-10-04 MED ORDER — SODIUM CHLORIDE 0.9 % IV SOLN: INTRAVENOUS | Status: AC

## 2020-10-04 MED ORDER — SODIUM CHLORIDE 0.9 % WEIGHT BASED INFUSION
3.0000 mL/kg/h | INTRAVENOUS | Status: DC
  Administered 2020-10-04: 3 mL/kg/h via INTRAVENOUS

## 2020-10-04 MED ORDER — HEPARIN (PORCINE) IN NACL 1000-0.9 UT/500ML-% IV SOLN
INTRAVENOUS | Status: DC | PRN
  Administered 2020-10-04 (×2): 500 mL

## 2020-10-04 MED ORDER — LIDOCAINE HCL (PF) 1 % IJ SOLN
INTRAMUSCULAR | Status: DC | PRN
  Administered 2020-10-04: 2 mL via INTRADERMAL

## 2020-10-04 MED ORDER — ROSUVASTATIN CALCIUM 20 MG PO TABS
20.0000 mg | ORAL_TABLET | Freq: Every day | ORAL | Status: DC
  Administered 2020-10-04 – 2020-10-08 (×5): 20 mg via ORAL
  Filled 2020-10-04 (×5): qty 1

## 2020-10-04 MED ORDER — SODIUM CHLORIDE 0.9 % IV SOLN: 250.0000 mL | INTRAVENOUS | Status: DC | PRN

## 2020-10-04 MED ORDER — IOHEXOL 350 MG/ML SOLN
INTRAVENOUS | Status: DC | PRN
  Administered 2020-10-04: 125 mL via INTRA_ARTERIAL

## 2020-10-04 MED ORDER — HEPARIN SODIUM (PORCINE) 1000 UNIT/ML IJ SOLN
INTRAMUSCULAR | Status: DC | PRN
  Administered 2020-10-04: 4500 [IU] via INTRAVENOUS

## 2020-10-04 MED ORDER — SODIUM CHLORIDE 0.9% FLUSH
3.0000 mL | Freq: Two times a day (BID) | INTRAVENOUS | Status: DC
Start: 2020-10-04 — End: 2020-10-04

## 2020-10-04 MED ORDER — SODIUM CHLORIDE 0.9 % IV SOLN
250.0000 mL | INTRAVENOUS | Status: DC | PRN
Start: 2020-10-04 — End: 2020-10-04

## 2020-10-04 MED ORDER — AMLODIPINE BESYLATE 5 MG PO TABS: 10.0000 mg | ORAL_TABLET | Freq: Every day | ORAL | 1 refills | Status: DC

## 2020-10-04 SURGICAL SUPPLY — 12 items
CATH 5FR JL3.5 JR4 ANG PIG MP (CATHETERS) ×2 IMPLANT
CATH INFINITI 5FR AL1 (CATHETERS) ×2 IMPLANT
DEVICE RAD COMP TR BAND LRG (VASCULAR PRODUCTS) ×2 IMPLANT
GLIDESHEATH SLEND SS 6F .021 (SHEATH) ×2 IMPLANT
KIT HEART LEFT (KITS) ×2 IMPLANT
PACK CARDIAC CATHETERIZATION (CUSTOM PROCEDURE TRAY) ×2 IMPLANT
SHEATH PROBE COVER 6X72 (BAG) ×2 IMPLANT
SYR MEDRAD MARK 7 150ML (SYRINGE) ×2 IMPLANT
TRANSDUCER W/STOPCOCK (MISCELLANEOUS) ×2 IMPLANT
TUBING CIL FLEX 10 FLL-RA (TUBING) ×2 IMPLANT
WIRE EMERALD 3MM-J .035X150CM (WIRE) ×2 IMPLANT
WIRE HI TORQ VERSACORE J 260CM (WIRE) ×2 IMPLANT

## 2020-10-04 NOTE — Progress Notes (Signed)
    Called to bedside by RN with patient sating in the 60s on RA. Underwent cardiac cath, result note pending. Received pre cath fluids. Now with audible wheezing, diaphoretic. RN helped patient up the chair. [place] placed. Ordered for IV lasix 40mg  x1, given. Placed on telemetry. Feeling better once sitting up in chair.  -- admit for diuresis, check CXR -- sats improved to 94 % on 3L [place] -- Cath MD updated on plan for diuresis and admission -- may need additional lasix pending response  Signed, Reino Bellis, NP-C 10/04/2020, 2:15 PM Pager: [phone]

## 2020-10-04 NOTE — Brief Op Note (Addendum)
BRIEF CATH NOTE  10/04/2020  1:34 PM  PROCEDURE:  Procedure(s): LEFT HEART CATH AND CORS/GRAFTS ANGIOGRAPHY (N/A)  SURGEON:  Surgeon(s) and Role:    * Leonie Man, MD - Primary  PATIENT:  Jacob Harrison  80 y.o. male with complicated cardiac history reviewed below.  Cath/PCI: LHC (10/18/12, [place], [place]): LMCA 20-30% ostial/proximal stenosis. LAD with sequential 100% and 95-99% mid stenoses. LCx with 40-50% mid stenosis. RCA with 100% proximal and 80-100% sequential distal lesions. RPDA and rPL1 have 99% and 95% stenoses, respectively. SVG->LAD and SVG->rPDA are patent. LVEF 67% with normal wall motion. LHC (04/05/10, [place], [place]): LMCA with 30% mid stenosis. LAD with 100% midvessel occlution. LCx with 40% mid stenosis. RCA with 100% proximal stenosis. LIMA->LAD atretic, SVG->LCx occluded, and SVG-> RCA with 80% ostial and 70% distal stenoses. Exercise myocardial perfusion stress test (10/07/12): Small to moderate in size, mild to moderate in severity, partially reversible defect involving the mid inferior and inferolateral segments. LVEF 65%.   CV Surgery: Redo CABG (04/2010, [place], [place]): SVG->LAD and SVG->rPDA. CABG (2001, [place]): LIMA->LAD, SVG->LCx, and SVG->rPDA (ALL OCLLUDED)   He was seen by Dr. Saunders Revel on October 03, 2020 with symptoms of progressive unstable angina despite being on significant medications.  He is already on 1000 mg twice daily Ranexa, max tolerated dose of Imdur and Coreg along with 5 mg Norvasc.  He is still requiring nitroglycerin and therefore is referred for cardiac catheterization.  Unfortunately previous images not available, but thankfully the previous report is available.  PRE-OPERATIVE DIAGNOSIS:  unstable angina  POST-OPERATIVE DIAGNOSIS:   Severe native CAD: Long left main the trifurcates into LAD, LCx and RI. The LAD is subtotally occluded after major septal perforator (that provides collaterals to the  PDA).  After the septal perforator the vessel essentially occluded yet still gives rise to a diagonal branch and 2 more septal perforators.  There are bridging collaterals filling the distal vessel with faint late filling.  The diagonal branch is small in caliber, but provides collaterals to the distal ramus as well as bridging collaterals to the LAD.  (The 2 small septal perforators do provide collaterals to the PDA) Moderate caliber RI is occluded proximally LCx is roughly 40% mid stenosis at bifurcation into major lateral OM and AV groove branch.  Both branches provide collaterals to the RPL system. RCA 100% proximal occluded. 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 There are ring markers indicating likely the new grafts:  New SVG-RPDA 100% proximal occlusion.  There is roughly 20 mm of dye hang up indicating relatively recent occlusion-likely culprit lesion.  Not likely to be salvaged.  Given the extent of collateral flow to the distal RCA system, likely competitive flow would keep the graft closed. Subacute to chronic flush occlusion of the new SVG-LAD.   PROCEDURE: Time Out: Verified patient identification, verified procedure, site/side was marked, verified correct patient position, special equipment/implants available, medications/allergies/relevent history reviewed, required imaging and test results available. Performed.  Access:  RIGHT Radial Artery: 6 Fr sheath -- Seldinger technique using Micropuncture Kit -- Direct ultrasound guidance used.  Permanent image obtained and placed on chart. -- 10 mL radial cocktail IA; 4500 Units IV Heparin  Left Heart Catheterization: 5Fr Catheters advanced or exchanged over a J-wire under direct fluoroscopic guidance into the ascending aorta; JR4 catheter advanced first.  * LV Hemodynamics (no LV Gram): AL-1 catheter *  Left Coronary Artery Cineangiography: JL 3.5 catheter  *  Right Coronary Artery Cineangiography: JR4 Catheter  * SVG-RPDA  & SVG-LAD  Cineangiography: AL1 Catheter  * AORTIC ROOT ANGIOGRAM: Angled pigtail catheter  Review of initial angiography revealed: Likely chronic occlusion of the SVG-LAD and subacute occlusion of SVG-RPDA.  Unable to visualize the older grafts with root angiogram.  Severe native disease, but no obvious potential PCI targets.  Preparations are made for optimizing medical management  Upon completion of Angiogaphy, the catheter was removed completely out of the body over a wire, without complication.    Radial sheath removed in the Cardiac Catheterization lab with TR Band placed for hemostasis.  TR Band: Placed for hemostasis at 1:30 p.m.  MEDICATIONS SQ Lidocaine 3 mL Radial Cocktail: 3 mg Verapmil in 10 mL NS Heparin: 4500 units   EBL:  <20 mL    DICTATION: .Note written in EPIC  PLAN OF CARE: With elevated LVEDP, was found to have rales on exam upon arrival to the short stay.  We will therefore monitor him overnight on observation status.  He was written for 40 mg Lasix.  May require additional Lasix.  This will allow [place] to also titrate up his medications prior to discharge.   Case discussed with Dr. Saunders Revel  PATIENT DISPOSITION:  PACU - hemodynamically stable.   Delay start of Pharmacological VTE agent (>24hrs) due to surgical blood loss or risk of bleeding: not applicable   Glenetta Hew, M.D., M.S. Interventional Cardiologist   Pager # [phone] Phone # [phone] [address] [place], [place] [address] CS probable shock

## 2020-10-04 NOTE — Progress Notes (Signed)
Called report to [place] on 6E.  Transferred patient with all belongings.

## 2020-10-04 NOTE — Plan of Care (Signed)

## 2020-10-04 NOTE — Progress Notes (Signed)
Received patient from the cath lab viz stretcher.  He was audible congestion.  O2 sats 60-70 upon arrival.  Placed patient on O2 [place]- 2L.  Notified Reino Bellis, NP.  She came to assess patient. Order for lasix  given.  Will continue to monitor.  Requested bed for the patient.  Will monitor overnight.

## 2020-10-04 NOTE — Interval H&P Note (Signed)
History and Physical Interval Note:  10/04/2020 12:25 PM  Jacob Harrison  has presented today for surgery, with the diagnosis of Known CAD with unstable angina.  The various methods of treatment have been discussed with the patient and family. After consideration of risks, benefits and other options for treatment, the patient has consented to  Procedure(s): LEFT HEART CATH AND CORS/GRAFTS ANGIOGRAPHY (N/A)  PERCUTANEOUS CORONARY INTERVENTION   as a surgical intervention.  The patient's history has been reviewed, patient examined, no change in status, stable for surgery.  I have reviewed the patient's chart and labs.  Questions were answered to the patient's satisfaction.    Cath Lab Visit (complete for each Cath Lab visit)  Clinical Evaluation Leading to the Procedure:   ACS: Yes.    Non-ACS:    Anginal Classification: CCS IV  Anti-ischemic medical therapy: Maximal Therapy (2 or more classes of medications)  Non-Invasive Test Results: No non-invasive testing performed  Prior CABG: Previous CABG     Glenetta Hew

## 2020-10-05 ENCOUNTER — Encounter (HOSPITAL_COMMUNITY): Payer: Self-pay | Admitting: Cardiology

## 2020-10-05 DIAGNOSIS — I200 Unstable angina: Secondary | ICD-10-CM

## 2020-10-05 LAB — CBC
HCT: 41.5 % (ref 39.0–52.0)
Hemoglobin: 14.2 g/dL (ref 13.0–17.0)
MCH: 31.3 pg (ref 26.0–34.0)
MCHC: 34.2 g/dL (ref 30.0–36.0)
MCV: 91.6 fL (ref 80.0–100.0)
Platelets: 260 10*3/uL (ref 150–400)
RBC: 4.53 MIL/uL (ref 4.22–5.81)
RDW: 13.4 % (ref 11.5–15.5)
WBC: 21.5 10*3/uL — ABNORMAL HIGH (ref 4.0–10.5)
nRBC: 0 % (ref 0.0–0.2)

## 2020-10-05 LAB — BASIC METABOLIC PANEL
Anion gap: 9 (ref 5–15)
BUN: 9 mg/dL (ref 8–23)
CO2: 26 mmol/L (ref 22–32)
Calcium: 9.3 mg/dL (ref 8.9–10.3)
Chloride: 103 mmol/L (ref 98–111)
Creatinine, Ser: 0.82 mg/dL (ref 0.61–1.24)
GFR, Estimated: >60 mL/min (ref >60)
Glucose, Bld: 141 mg/dL — ABNORMAL HIGH (ref 70–99)
Potassium: 3.7 mmol/L (ref 3.5–5.1)
Sodium: 138 mmol/L (ref 135–145)

## 2020-10-05 LAB — GLUCOSE, CAPILLARY
Glucose-Capillary: 181 mg/dL — ABNORMAL HIGH (ref 70–99)
Glucose-Capillary: 179 mg/dL — ABNORMAL HIGH (ref 70–99)
Glucose-Capillary: 216 mg/dL — ABNORMAL HIGH (ref 70–99)

## 2020-10-05 MED ORDER — NITROGLYCERIN 0.4 MG SL SUBL
0.4000 mg | SUBLINGUAL_TABLET | SUBLINGUAL | Status: DC | PRN
  Administered 2020-10-05: 0.4 mg via SUBLINGUAL
  Filled 2020-10-05: qty 1

## 2020-10-05 MED ORDER — FUROSEMIDE 10 MG/ML IJ SOLN: 40.0000 mg | Freq: Every day | INTRAMUSCULAR | Status: DC

## 2020-10-05 MED ORDER — INSULIN ASPART 100 UNIT/ML IJ SOLN
0.0000 [IU] | Freq: Three times a day (TID) | INTRAMUSCULAR | Status: DC
Start: 2020-10-05 — End: 2020-10-09
  Administered 2020-10-05 (×2): 2 [IU] via SUBCUTANEOUS
  Administered 2020-10-06: 1 [IU] via SUBCUTANEOUS
  Administered 2020-10-06: 2 [IU] via SUBCUTANEOUS
  Administered 2020-10-07: 1 [IU] via SUBCUTANEOUS
  Administered 2020-10-07: 2 [IU] via SUBCUTANEOUS
  Administered 2020-10-07 – 2020-10-08 (×3): 1 [IU] via SUBCUTANEOUS
  Administered 2020-10-09: 3 [IU] via SUBCUTANEOUS
  Administered 2020-10-09: 1 [IU] via SUBCUTANEOUS

## 2020-10-05 MED ORDER — AMLODIPINE BESYLATE 5 MG PO TABS
5.0000 mg | ORAL_TABLET | Freq: Every day | ORAL | Status: DC
  Administered 2020-10-05: 5 mg via ORAL
  Filled 2020-10-05: qty 1

## 2020-10-05 MED ORDER — ISOSORBIDE MONONITRATE ER 30 MG PO TB24
15.0000 mg | ORAL_TABLET | Freq: Every day | ORAL | Status: DC
  Administered 2020-10-05 – 2020-10-07 (×3): 15 mg via ORAL
  Filled 2020-10-05 (×4): qty 1

## 2020-10-05 MED ORDER — SODIUM CHLORIDE 0.9 % IV BOLUS
250.0000 mL | Freq: Once | INTRAVENOUS | Status: AC
  Administered 2020-10-05: 250 mL via INTRAVENOUS

## 2020-10-05 MED ORDER — FUROSEMIDE 40 MG PO TABS: 40.0000 mg | ORAL_TABLET | Freq: Every day | ORAL | Status: DC

## 2020-10-05 MED ORDER — FUROSEMIDE 10 MG/ML IJ SOLN
40.0000 mg | Freq: Once | INTRAMUSCULAR | Status: AC
  Administered 2020-10-05: 40 mg via INTRAVENOUS
  Filled 2020-10-05: qty 4

## 2020-10-05 NOTE — Progress Notes (Signed)
Patient starting complaining of chest tightness and chest pressure in which he rates at a level 5 on the 0-10 pain scale.  EKG done, oxygen applied to the patient for mild shortness of breath, MD notified. Medications given as MD  ordered.  Will continue to monitor.   Donah Driver, RN

## 2020-10-05 NOTE — Progress Notes (Signed)
Patient states that he is now feeling cold, sweaty, and nauseous. Patient also states that he has a headache.  MD notified. Medications given per MD order.  Will continue to monitor.   Donah Driver, RN

## 2020-10-05 NOTE — Progress Notes (Addendum)
Progress Note  Patient Name: Jacob Harrison Date of Encounter: 10/05/2020  [facility] Cardiologist: Nelva Bush, MD   Subjective   Patient states he is still having chest discomfort this morning. He states he can't take a deep breath. He had his breakfast and started having mid-sternum discomfort, mild pain 2/10, bearable and not going away. He states nitro helped some. He has been using Nitro SL 3-4 times daily at home prior to this admission in order to function. He states he is not particularly SOB with ambulation, was much more SOB and congested yesterday, he felt his ankle edema is a little worse than yesterday. He reports hx of headache with Imdur and hypotension when Coreg dosing was increased. He was taking Lasix 40mg  daily at home.   Inpatient Medications    Scheduled Meds:  amLODipine  5 mg Oral Daily   carvedilol  3.125 mg Oral BID WC   furosemide  40 mg Intravenous Daily   insulin aspart  0-9 Units Subcutaneous TID WC   ranolazine  1,000 mg Oral BID   rosuvastatin  20 mg Oral QHS   sodium chloride flush  3 mL Intravenous Q12H   Continuous Infusions:  sodium chloride     PRN Meds: sodium chloride, acetaminophen, nitroGLYCERIN, ondansetron (ZOFRAN) IV, sodium chloride flush   Vital Signs    Vitals:   10/04/20 1516 10/04/20 1613 10/04/20 1957 10/05/20 0250  BP: 117/76 114/72 127/75 109/68  Pulse: 99 97 86 77  Resp: 17  18 20   Temp: 98.3 F (36.8 C)  97.9 F (36.6 C) 99 F (37.2 C)  TempSrc: Oral  Oral Oral  SpO2:  95% 97% 96%  Weight:      Height:        Intake/Output Summary (Last 24 hours) at 10/05/2020 1007 Last data filed at 10/04/2020 2234 Gross per 24 hour  Intake 237 ml  Output 1100 ml  Net -863 ml   Last 3 Weights 10/04/2020 10/03/2020 09/04/2020  Weight (lbs) 188 lb 188 lb 196 lb 11.2 oz  Weight (kg) 85.276 kg 85.276 kg 89.223 kg      Telemetry    Sinus rhythm with ventricular rate of 80s - Personally Reviewed  ECG    EKG  this AM showed SR,  78 bpm, ST depression of lead I and aVL, slight ST elevation of III and aVF- Personally Reviewed  Physical Exam   GEN: No acute distress.  Sitting at the edge of the bed.  Neck: No significant JVD Cardiac: RRR, no murmurs, rubs, or gallops.  Respiratory: Clear to auscultation bilaterally. On room air.  GI: Soft, nontender, non-distended  MS: Bilateral ankle 1+ edema; No deformity. Neuro:  Nonfocal  Psych: Normal affect  Right radial site with dressing in place, radial pulse +, hand warm to touch , no neurovascular deficit   Labs    High Sensitivity Troponin:  No results for input(s): TROPONINIHS in the last 720 hours.    Chemistry Recent Labs  Lab 10/03/20 1250 10/05/20 0754  NA 136 138  K 4.1 3.7  CL 99 103  CO2 25 26  GLUCOSE 115* 141*  BUN 12 9  CREATININE 0.91 0.82  CALCIUM 10.1 9.3  GFRNONAA >60 >60  ANIONGAP 12 9     Hematology Recent Labs  Lab 10/03/20 1250 10/05/20 0754  WBC 14.5* 21.5*  RBC 4.45 4.53  HGB 14.3 14.2  HCT 40.4 41.5  MCV 90.8 91.6  MCH 32.1 31.3  MCHC 35.4 34.2  RDW 13.3 13.4  PLT 264 260    BNPNo results for input(s): BNP, PROBNP in the last 168 hours.   DDimer No results for input(s): DDIMER in the last 168 hours.   Radiology    DG Chest 2 View  Result Date: 10/04/2020 CLINICAL DATA:  Postop EXAM: CHEST - 2 VIEW COMPARISON:  10/13/2018 FINDINGS: Postop CABG. Mild to moderate diffuse bilateral airspace disease has developed since the prior study. Chronic left pleural thickening is unchanged. No right pleural effusion. IMPRESSION: Mild to moderate diffuse bilateral airspace disease. Favor edema although pneumonia possible. Electronically Signed   By: Franchot Gallo M.D.   On: 10/04/2020 15:17   CARDIAC CATHETERIZATION  Result Date: 10/04/2020 Formatting of this result is different from the original.  -----SEVERE ~ 3 VESSEL NATIVE CAD------  Mid LAD to Dist LAD lesion is 99% stenosed.  Mid Cx lesion is 40%  stenosed with 40% stenosed side branch in LPAV.  Prox RCA to Dist RCA lesion is 100% stenosed.  ---- GRAFTS-----  SVG-RPDA graft was visualized by angiography. Prox Graft to Insertion lesion is 100% stenosed.  SVG-LAD graft was visualized by angiography. Origin to Prox Graft lesion is 100% stenosed. (flush occluded)  Unable to Visualize old Grafts (original SVG-rPDA & SVG-LCx). 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  ------  LV end diastolic pressure is moderately elevated.  There is no aortic valve stenosis.  SUMMARY  Severe native CAD: ? Long left main the trifurcates into LAD, LCx and RI. - The LAD is subtotally occluded after major septal perforator (that provides collaterals to the PDA).  After the septal perforator the vessel essentially occluded yet still gives rise to a diagonal branch and 2 more septal perforators.  There are bridging collaterals filling the distal vessel with faint late filling.  The diagonal branch is small in caliber, but provides collaterals to the distal ramus as well as bridging collaterals to the LAD.  (The 2 small septal perforators do provide collaterals to the PDA) - Moderate caliber RI is occluded proximally - LCx is roughly 40% mid stenosis at bifurcation into major lateral OM and AV groove branch.  Both branches provide collaterals to the RPL system. ? RCA 100% proximal occluded.   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  There are ring markers indicating likely the new grafts:  New SVG-RPDA 100% proximal occlusion.  There is roughly 20 mm of dye hang up indicating relatively recent occlusion-likely culprit lesion.  Not likely to be salvaged.  Given the extent of collateral flow to the distal RCA system, likely competitive flow would keep the graft closed.  Subacute to chronic flush occlusion of the new SVG-LAD.  Moderate to Severely Elevated LVEDP c/w Diastolic CHF (with ongoing CP &  SOB - Acute on Chronic) Glenetta Hew, MD   Cardiac Studies   Left heart cath on 10/04/20:  -----SEVERE ~ 3 VESSEL NATIVE CAD------ Mid LAD to Dist LAD lesion is 99% stenosed. Mid Cx lesion is 40% stenosed with 40% stenosed side branch in LPAV. Prox RCA to Dist RCA lesion is 100% stenosed. ---- GRAFTS----- SVG-RPDA graft was visualized by angiography. Prox Graft to Insertion lesion is 100% stenosed. SVG-LAD graft was visualized by angiography. Origin to Prox Graft lesion is 100% stenosed. (flush occluded) Unable to Visualize old Grafts (original SVG-rPDA & SVG-LCx). 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 ------ LV end diastolic pressure is moderately  elevated. There is no aortic valve stenosis.   SUMMARY Severe native CAD: Long left main the trifurcates into LAD, LCx and RI. The LAD is subtotally occluded after major septal perforator (that provides collaterals to the PDA).  After the septal perforator the vessel essentially occluded yet still gives rise to a diagonal branch and 2 more septal perforators.  There are bridging collaterals filling the distal vessel with faint late filling.  The diagonal branch is small in caliber, but provides collaterals to the distal ramus as well as bridging collaterals to the LAD.  (The 2 small septal perforators do provide collaterals to the PDA) Moderate caliber RI is occluded proximally LCx is roughly 40% mid stenosis at bifurcation into major lateral OM and AV groove branch.  Both branches provide collaterals to the RPL system. RCA 100% proximal occluded.   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 There are ring markers indicating likely the new grafts:  New SVG-RPDA 100% proximal occlusion.  There is roughly 20 mm of dye hang up indicating relatively recent occlusion-likely culprit lesion.  Not likely to be salvaged.  Given the extent of collateral flow to the distal RCA  system, likely competitive flow would keep the graft closed. Subacute to chronic flush occlusion of the new SVG-LAD.   Moderate to Severely Elevated LVEDP c/w Diastolic CHF (with ongoing CP & SOB - Acute on Chronic)  Diagnostic Dominance: Right      Echo from 09/23/19:   1. Left ventricular ejection fraction, by estimation, is 60 to 65%. The  left ventricle has normal function. The left ventricle has no regional  wall motion abnormalities. Left ventricular diastolic parameters are  consistent with Grade I diastolic dysfunction (impaired relaxation).   2. Right ventricular systolic function is normal. The right ventricular  size is normal. Tricuspid regurgitation signal is inadequate for assessing  PA pressure.   3. Aortic valve regurgitation is not visualized. moderate aortic valve  sclerosis/calcification is present, without any evidence of aortic  stenosis.    Patient Profile   80 y.o. 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    His complex cardiac hx :  Cath/PCI: LHC (10/18/12, [place], [place]): LMCA 20-30% ostial/proximal stenosis. LAD with sequential 100% and 95-99% mid stenoses. LCx with 40-50% mid stenosis. RCA with 100% proximal and 80-100% sequential distal lesions. RPDA and rPL1 have 99% and 95% stenoses, respectively. SVG->LAD and SVG->rPDA are patent. LVEF 67% with normal wall motion.  LHC (04/05/10, [place], [place]): LMCA with 30% mid stenosis. LAD with 100% midvessel occlution. LCx with 40% mid stenosis. RCA with 100% proximal stenosis. LIMA->LAD atretic, SVG->LCx occluded, and SVG-> RCA with 80% ostial and 70% distal stenoses.  Exercise myocardial  perfusion stress test (10/07/12): Small to moderate in size, mild to moderate in severity, partially reversible defect involving the mid inferior and inferolateral segments. LVEF 65%.   CV Surgery: Redo CABG (04/2010, [place], [place]): SVG->LAD and SVG->rPDA. CABG (2001, [place]): LIMA->LAD, SVG->LCx, and SVG->rPDA (ALL OCLLUDED)  Assessment & Plan    Severe 2 vessel CAD with hx of CABG x2 - s/p elective cardiac cath on 10/04/20, see report  for extensive disease , no intervention was done due to no target, LVEDP moderate to severely elevated, hypoxic post-procedure required Lasix bolus  - 10/05/20 AM had recurrent chest pain/pressure, EKG showed slight ST elevation of lead III and aVF, treated with SL Nitro and had improved symptoms  - query chest discomfort from volume overload, will start IV Lasix 40mg  daily today for more diuresis  - currently continued medical therapy with ASA 81mg  daily, Coreg 3.125mg  BID, Ranexa 1000mg  BID, crestor 20mg , not tolerating Imdur in the past due to headache, not tolerating high dose Coreg in the past due to hypotension, will resume  home meds amlodipine 5mg  today, further anti-anginal adjustment per MD  - A1C 6.1%, LDL 63 on 08/31/20 , fair control   Acute diastolic heart failure  - cath 6/30 with LVEDP moderate to severely elevated 73mmHg - CXR showed mild to moderate diffuse bilateral airspace disease favor edema 10/03/20 - clinically hypoxic after cardiac cath with signs of overload - s/p IV Lasix 40mg  x1 yesterday, UOP 1.1 L overnight, with continued chest discomfort, will resume IV Lasix 40mg  today, may transition to PO when euvolemic  - renal index WNL today - will monitor I&O and daily weight   Leukocytosis - WBC rising today with WBC 21500, afebrile, monitor s/s of infection   HTN - BP low normal this AM with above antihypertensive , will not up-tritiate anti-angina meds at this time   HLD - LDL 63 on 08/31/20 , fair control, continue crestor 20mg    Type  2 DM - A1C 6.1%,  fair control , metformin held in house - [place]  - will add Novolog inuslin SSI while in house    For questions or updates, please contact [facility] Please consult [email or web address] for contact info under      Signed, Margie Billet, NP  10/05/2020, 10:07 AM     Patient seen, examined. Available data reviewed. Agree with findings, assessment, and plan as outlined by Margie Billet, NP.  Patient independently interviewed and examined.  On my exam this morning, the patient is alert, oriented, in no distress.  Lungs are clear, heart is regular rate and rhythm with no murmur gallop, abdomen soft and nontender, extremities have 1+ bilateral pretibial edema, right radial cath site is clear.  I have personally reviewed the patient's cardiac catheterization images which demonstrate angiographic findings consistent with recent occlusion of the SVG to RCA.  I agree there are no real targets for intervention.  Unfortunately our treatment options are pretty limited.  He is already on a very good antianginal program.  He is willing to give another trial to low-dose isosorbide.  I will add 15 mg daily this morning.  IV diuresis seems appropriate to try to lower filling pressures which may improve his angina as well.  The patient continues to have low-grade chest discomfort, so we will keep him in the hospital today and would anticipate hospital discharge tomorrow if he has tolerated medication adjustments today.  Sherren Mocha, M.D. 10/05/2020 11:08 AM

## 2020-10-05 NOTE — Care Management Obs Status (Signed)
[place] NOTIFICATION   Patient Details  Name: Jacob Harrison MRN: [id number] Date of Birth: June 07, 1940   Medicare Observation Status Notification Given:  Yes    Bethena Roys, RN 10/05/2020, 4:27 PM

## 2020-10-05 NOTE — Progress Notes (Signed)
Overnight Events  Patient reported chest tightness/pressure early AM. Rated 5/10. STAT ECG with slight ST elevations in inferior leads.  Cath from earlier (by Dr Ellyn Hack) showed possible subacute occlusion of SVG-RPDA. No PCI targets. Medical management advised.  -Treated with S/L NTG  Chest pain improved. BP 97/67, HR 65 bpm. Patient feeling better.    Melina Schools, MD, [facility]

## 2020-10-06 DIAGNOSIS — I110 Hypertensive heart disease with heart failure: Secondary | ICD-10-CM | POA: Diagnosis present

## 2020-10-06 DIAGNOSIS — I214 Non-ST elevation (NSTEMI) myocardial infarction: Secondary | ICD-10-CM | POA: Diagnosis present

## 2020-10-06 DIAGNOSIS — E1169 Type 2 diabetes mellitus with other specified complication: Secondary | ICD-10-CM | POA: Diagnosis present

## 2020-10-06 DIAGNOSIS — Z888 Allergy status to other drugs, medicaments and biological substances status: Secondary | ICD-10-CM | POA: Diagnosis not present

## 2020-10-06 DIAGNOSIS — E785 Hyperlipidemia, unspecified: Secondary | ICD-10-CM | POA: Diagnosis present

## 2020-10-06 DIAGNOSIS — R0902 Hypoxemia: Secondary | ICD-10-CM | POA: Diagnosis present

## 2020-10-06 DIAGNOSIS — I10 Essential (primary) hypertension: Secondary | ICD-10-CM | POA: Diagnosis not present

## 2020-10-06 DIAGNOSIS — Z881 Allergy status to other antibiotic agents status: Secondary | ICD-10-CM | POA: Diagnosis not present

## 2020-10-06 DIAGNOSIS — M79606 Pain in leg, unspecified: Secondary | ICD-10-CM | POA: Diagnosis present

## 2020-10-06 DIAGNOSIS — G8929 Other chronic pain: Secondary | ICD-10-CM | POA: Diagnosis present

## 2020-10-06 DIAGNOSIS — Z79899 Other long term (current) drug therapy: Secondary | ICD-10-CM | POA: Diagnosis not present

## 2020-10-06 DIAGNOSIS — I255 Ischemic cardiomyopathy: Secondary | ICD-10-CM | POA: Diagnosis present

## 2020-10-06 DIAGNOSIS — I5033 Acute on chronic diastolic (congestive) heart failure: Secondary | ICD-10-CM | POA: Diagnosis present

## 2020-10-06 DIAGNOSIS — Z833 Family history of diabetes mellitus: Secondary | ICD-10-CM | POA: Diagnosis not present

## 2020-10-06 DIAGNOSIS — Z7984 Long term (current) use of oral hypoglycemic drugs: Secondary | ICD-10-CM | POA: Diagnosis not present

## 2020-10-06 DIAGNOSIS — Z7982 Long term (current) use of aspirin: Secondary | ICD-10-CM | POA: Diagnosis not present

## 2020-10-06 DIAGNOSIS — R0789 Other chest pain: Secondary | ICD-10-CM | POA: Diagnosis present

## 2020-10-06 DIAGNOSIS — Z83438 Family history of other disorder of lipoprotein metabolism and other lipidemia: Secondary | ICD-10-CM | POA: Diagnosis not present

## 2020-10-06 DIAGNOSIS — E876 Hypokalemia: Secondary | ICD-10-CM | POA: Diagnosis present

## 2020-10-06 DIAGNOSIS — I700 Atherosclerosis of aorta: Secondary | ICD-10-CM | POA: Diagnosis present

## 2020-10-06 DIAGNOSIS — I25730 Atherosclerosis of nonautologous biological coronary artery bypass graft(s) with unstable angina pectoris: Secondary | ICD-10-CM | POA: Diagnosis present

## 2020-10-06 DIAGNOSIS — I25110 Atherosclerotic heart disease of native coronary artery with unstable angina pectoris: Secondary | ICD-10-CM | POA: Diagnosis present

## 2020-10-06 LAB — CBC
HCT: 37.1 % — ABNORMAL LOW (ref 39.0–52.0)
Hemoglobin: 13 g/dL (ref 13.0–17.0)
MCH: 31.6 pg (ref 26.0–34.0)
MCHC: 35 g/dL (ref 30.0–36.0)
MCV: 90 fL (ref 80.0–100.0)
Platelets: 219 10*3/uL (ref 150–400)
RBC: 4.12 MIL/uL — ABNORMAL LOW (ref 4.22–5.81)
RDW: 13.2 % (ref 11.5–15.5)
WBC: 19.6 10*3/uL — ABNORMAL HIGH (ref 4.0–10.5)
nRBC: 0 % (ref 0.0–0.2)

## 2020-10-06 LAB — GLUCOSE, CAPILLARY
Glucose-Capillary: 147 mg/dL — ABNORMAL HIGH (ref 70–99)
Glucose-Capillary: 160 mg/dL — ABNORMAL HIGH (ref 70–99)
Glucose-Capillary: 136 mg/dL — ABNORMAL HIGH (ref 70–99)
Glucose-Capillary: 123 mg/dL — ABNORMAL HIGH (ref 70–99)

## 2020-10-06 LAB — BASIC METABOLIC PANEL
Anion gap: 9 (ref 5–15)
BUN: 9 mg/dL (ref 8–23)
CO2: 26 mmol/L (ref 22–32)
Calcium: 9 mg/dL (ref 8.9–10.3)
Chloride: 98 mmol/L (ref 98–111)
Creatinine, Ser: 0.81 mg/dL (ref 0.61–1.24)
GFR, Estimated: >60 mL/min (ref >60)
Glucose, Bld: 134 mg/dL — ABNORMAL HIGH (ref 70–99)
Potassium: 3.8 mmol/L (ref 3.5–5.1)
Sodium: 133 mmol/L — ABNORMAL LOW (ref 135–145)

## 2020-10-06 MED ORDER — FUROSEMIDE 10 MG/ML IJ SOLN
40.0000 mg | Freq: Once | INTRAMUSCULAR | Status: AC
  Administered 2020-10-06: 40 mg via INTRAVENOUS
  Filled 2020-10-06: qty 4

## 2020-10-06 MED ORDER — CARVEDILOL 6.25 MG PO TABS
6.2500 mg | ORAL_TABLET | Freq: Two times a day (BID) | ORAL | Status: DC
  Administered 2020-10-06 – 2020-10-07 (×3): 6.25 mg via ORAL
  Filled 2020-10-06 (×3): qty 1

## 2020-10-06 MED ORDER — AMLODIPINE BESYLATE 2.5 MG PO TABS
2.5000 mg | ORAL_TABLET | Freq: Every day | ORAL | Status: DC
  Administered 2020-10-06: 2.5 mg via ORAL
  Filled 2020-10-06 (×2): qty 1

## 2020-10-06 NOTE — Progress Notes (Signed)
Progress Note  Patient Name: Jacob Harrison Date of Encounter: 10/06/2020  Primary Cardiologist:   Nelva Bush, MD   Subjective   He is breathing better but not at baseline.  No chest pain.  Has not ambulated and has been on O2 which he does not yet have at home  Inpatient Medications    Scheduled Meds:  amLODipine  5 mg Oral Daily   carvedilol  3.125 mg Oral BID WC   furosemide  40 mg Oral Daily   insulin aspart  0-9 Units Subcutaneous TID WC   isosorbide mononitrate  15 mg Oral Daily   ranolazine  1,000 mg Oral BID   rosuvastatin  20 mg Oral QHS   sodium chloride flush  3 mL Intravenous Q12H   Continuous Infusions:  sodium chloride     PRN Meds: sodium chloride, acetaminophen, nitroGLYCERIN, ondansetron (ZOFRAN) IV, sodium chloride flush   Vital Signs    Vitals:   10/05/20 1722 10/05/20 2019 10/06/20 0144 10/06/20 0618  BP: 103/65  97/87   Pulse: 83 83 74   Resp:  20    Temp:  98.7 F (37.1 C) 99.4 F (37.4 C)   TempSrc:  Oral Oral   SpO2:  92%    Weight:    82.6 kg  Height:        Intake/Output Summary (Last 24 hours) at 10/06/2020 1025 Last data filed at 10/05/2020 1419 Gross per 24 hour  Intake --  Output 300 ml  Net -300 ml   Filed Weights   10/04/20 0839 10/05/20 1717 10/06/20 0618  Weight: 85.3 kg 82.4 kg 82.6 kg    Telemetry    NSR - Personally Reviewed  ECG    NA - Personally Reviewed  Physical Exam   GEN: No acute distress.   Neck: No  JVD Cardiac: RRR, no murmurs, rubs, or gallops.  Respiratory:        Bilateral basilar crackles.  GI: Soft, nontender, non-distended  MS: No  edema; No deformity. Neuro:  Nonfocal  Psych: Normal affect   Labs    Chemistry Recent Labs  Lab 10/03/20 1250 10/05/20 0754 10/06/20 0147  NA 136 138 133*  K 4.1 3.7 3.8  CL 99 103 98  CO2 25 26 26   GLUCOSE 115* 141* 134*  BUN 12 9 9   CREATININE 0.91 0.82 0.81  CALCIUM 10.1 9.3 9.0  GFRNONAA >60 >60 >60  ANIONGAP 12 9 9       Hematology Recent Labs  Lab 10/03/20 1250 10/05/20 0754 10/06/20 0147  WBC 14.5* 21.5* 19.6*  RBC 4.45 4.53 4.12*  HGB 14.3 14.2 13.0  HCT 40.4 41.5 37.1*  MCV 90.8 91.6 90.0  MCH 32.1 31.3 31.6  MCHC 35.4 34.2 35.0  RDW 13.3 13.4 13.2  PLT 264 260 219    Cardiac EnzymesNo results for input(s): TROPONINI in the last 168 hours. No results for input(s): TROPIPOC in the last 168 hours.   BNPNo results for input(s): BNP, PROBNP in the last 168 hours.   DDimer No results for input(s): DDIMER in the last 168 hours.   Radiology    DG Chest 2 View  Result Date: 10/04/2020 CLINICAL DATA:  Postop EXAM: CHEST - 2 VIEW COMPARISON:  10/13/2018 FINDINGS: Postop CABG. Mild to moderate diffuse bilateral airspace disease has developed since the prior study. Chronic left pleural thickening is unchanged. No right pleural effusion. IMPRESSION: Mild to moderate diffuse bilateral airspace disease. Favor edema although pneumonia possible. Electronically Signed  By: Franchot Gallo M.D.   On: 10/04/2020 15:17   CARDIAC CATHETERIZATION  Result Date: 10/04/2020 Formatting of this result is different from the original.  -----SEVERE ~ 3 VESSEL NATIVE CAD------  Mid LAD to Dist LAD lesion is 99% stenosed.  Mid Cx lesion is 40% stenosed with 40% stenosed side branch in LPAV.  Prox RCA to Dist RCA lesion is 100% stenosed.  ---- GRAFTS-----  SVG-RPDA graft was visualized by angiography. Prox Graft to Insertion lesion is 100% stenosed.  SVG-LAD graft was visualized by angiography. Origin to Prox Graft lesion is 100% stenosed. (flush occluded)  Unable to Visualize old Grafts (original SVG-rPDA & SVG-LCx). 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  ------  LV end diastolic pressure is moderately elevated.  There is no aortic valve stenosis.  SUMMARY  Severe native CAD: ? Long left main the trifurcates into LAD, LCx and RI. - The LAD is subtotally occluded after major septal perforator (that provides  collaterals to the PDA).  After the septal perforator the vessel essentially occluded yet still gives rise to a diagonal branch and 2 more septal perforators.  There are bridging collaterals filling the distal vessel with faint late filling.  The diagonal branch is small in caliber, but provides collaterals to the distal ramus as well as bridging collaterals to the LAD.  (The 2 small septal perforators do provide collaterals to the PDA) - Moderate caliber RI is occluded proximally - LCx is roughly 40% mid stenosis at bifurcation into major lateral OM and AV groove branch.  Both branches provide collaterals to the RPL system. ? RCA 100% proximal occluded.   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  There are ring markers indicating likely the new grafts:  New SVG-RPDA 100% proximal occlusion.  There is roughly 20 mm of dye hang up indicating relatively recent occlusion-likely culprit lesion.  Not likely to be salvaged.  Given the extent of collateral flow to the distal RCA system, likely competitive flow would keep the graft closed.  Subacute to chronic flush occlusion of the new SVG-LAD.  Moderate to Severely Elevated LVEDP c/w Diastolic CHF (with ongoing CP & SOB - Acute on Chronic) Glenetta Hew, MD   Cardiac Studies    Diagnostic Dominance: Right        Echo from 09/23/19:    1. Left ventricular ejection fraction, by estimation, is 60 to 65%. The  left ventricle has normal function. The left ventricle has no regional  wall motion abnormalities. Left ventricular diastolic parameters are  consistent with Grade I diastolic dysfunction (impaired relaxation).   2. Right ventricular systolic function is normal. The right ventricular  size is normal. Tricuspid regurgitation signal is inadequate for assessing  PA pressure.   3. Aortic valve regurgitation is not visualized. moderate aortic valve  sclerosis/calcification  is present, without any evidence of aortic  stenosis.  Patient Profile     80 y.o. 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    Assessment & Plan      Severe 2 vessel CAD with hx of CABG x2:    No options for intervention.  Started low dose  Imdur and diuresed yesterday for antianginal management.   I am going to continue IV diuresis.  I am going to check to see if he qualifies for home O2.  I will reduce his Norvasc and increase his beta blocker.    Acute diastolic heart failure :   Net negative 1.16 liters yesterday.  He had increased EDP on cath.   See above.  Leukocytosis:  WBC going down.  No active site of infection.   HTN:    BP runs low and precludes med titration.  See above.     HLD:  At target.  Continue current therapy.    Type 2 DM:   Good control.  Continue current therapy.     For questions or updates, please contact [place] Please consult [email or web address] for contact info under Cardiology/STEMI.   Signed, Minus Breeding, MD  10/06/2020, 10:25 AM

## 2020-10-06 NOTE — Plan of Care (Signed)
  Problem: Activity: Goal: Ability to return to baseline activity level will improve Outcome: Progressing   Problem: Cardiovascular: Goal: Ability to achieve and maintain adequate cardiovascular perfusion will improve Outcome: Progressing Goal: Vascular access site(s) Level 0-1 will be maintained Outcome: Progressing   Problem: Health Behavior/Discharge Planning: Goal: Ability to safely manage health-related needs after discharge will improve Outcome: Progressing   

## 2020-10-07 LAB — GLUCOSE, CAPILLARY
Glucose-Capillary: 135 mg/dL — ABNORMAL HIGH (ref 70–99)
Glucose-Capillary: 181 mg/dL — ABNORMAL HIGH (ref 70–99)
Glucose-Capillary: 150 mg/dL — ABNORMAL HIGH (ref 70–99)

## 2020-10-07 LAB — TROPONIN I (HIGH SENSITIVITY)
Troponin I (High Sensitivity): 9644 ng/L (ref <18)
Troponin I (High Sensitivity): 12384 ng/L (ref <18)

## 2020-10-07 MED ORDER — CARVEDILOL 12.5 MG PO TABS: 12.5000 mg | ORAL_TABLET | Freq: Two times a day (BID) | ORAL | Status: DC

## 2020-10-07 MED ORDER — CARVEDILOL 12.5 MG PO TABS
12.5000 mg | ORAL_TABLET | Freq: Two times a day (BID) | ORAL | Status: DC
  Administered 2020-10-08 – 2020-10-09 (×2): 12.5 mg via ORAL
  Filled 2020-10-07 (×4): qty 1

## 2020-10-07 MED ORDER — ASPIRIN EC 81 MG PO TBEC
81.0000 mg | DELAYED_RELEASE_TABLET | Freq: Every day | ORAL | Status: DC
  Administered 2020-10-07 – 2020-10-09 (×3): 81 mg via ORAL
  Filled 2020-10-07 (×3): qty 1

## 2020-10-07 NOTE — Progress Notes (Signed)
Progress Note  Patient Name: Jacob Harrison Date of Encounter: 10/07/2020  Primary Cardiologist:   Nelva Bush, MD   Subjective   I was called to see the patient because of an abnormal EKG suggestive of a STEMI.  Actually this STEMI was activated.  The patient was complaining of shortness of breath.  He said he been short of breath off and on through the night.  He was not wearing his oxygen but he felt better this morning when he put it on.  He is not having any new chest pressure.  EKG did not demonstrate acute changes compared to previous.  He has chronic inferior ST elevation.  Inpatient Medications    Scheduled Meds:  amLODipine  2.5 mg Oral Daily   aspirin EC  81 mg Oral Daily   carvedilol  6.25 mg Oral BID WC   insulin aspart  0-9 Units Subcutaneous TID WC   isosorbide mononitrate  15 mg Oral Daily   ranolazine  1,000 mg Oral BID   rosuvastatin  20 mg Oral QHS   sodium chloride flush  3 mL Intravenous Q12H   Continuous Infusions:  sodium chloride     PRN Meds: sodium chloride, acetaminophen, nitroGLYCERIN, ondansetron (ZOFRAN) IV, sodium chloride flush   Vital Signs    Vitals:   10/07/20 0448 10/07/20 0855 10/07/20 0900 10/07/20 0930  BP: (!) 104/59 105/71  112/74  Pulse: 100 (!) 106 (!) 105 (!) 104  Resp: 16     Temp: 99.6 F (37.6 C)     TempSrc: Oral     SpO2: 91%     Weight: 82.1 kg     Height:        Intake/Output Summary (Last 24 hours) at 10/07/2020 0959 Last data filed at 10/07/2020 0700 Gross per 24 hour  Intake 390 ml  Output 1500 ml  Net -1110 ml   Filed Weights   10/05/20 1717 10/06/20 0618 10/07/20 0448  Weight: 82.4 kg 82.6 kg 82.1 kg    Telemetry    Sinus tachycardia- Personally Reviewed  ECG    Sinus tachycardia, rate 106, axis within normal limits, inferior ST elevation consistent with acute injury pattern but unchanged from previous.  Lateral ST depression unchanged.- Personally Reviewed  Physical Exam   GEN: No   acute distress.   Neck: No  JVD Cardiac: RRR, no murmurs, rubs, or gallops.  Respiratory:    decreased breath sounds at the bases with right greater than left fine crackles. GI: Soft, nontender, non-distended, normal bowel sounds  MS:  No edema; No deformity. Neuro:   Nonfocal  Psych: Oriented and appropriate   Labs    Chemistry Recent Labs  Lab 10/03/20 1250 10/05/20 0754 10/06/20 0147  NA 136 138 133*  K 4.1 3.7 3.8  CL 99 103 98  CO2 25 26 26   GLUCOSE 115* 141* 134*  BUN 12 9 9   CREATININE 0.91 0.82 0.81  CALCIUM 10.1 9.3 9.0  GFRNONAA >60 >60 >60  ANIONGAP 12 9 9      Hematology Recent Labs  Lab 10/03/20 1250 10/05/20 0754 10/06/20 0147  WBC 14.5* 21.5* 19.6*  RBC 4.45 4.53 4.12*  HGB 14.3 14.2 13.0  HCT 40.4 41.5 37.1*  MCV 90.8 91.6 90.0  MCH 32.1 31.3 31.6  MCHC 35.4 34.2 35.0  RDW 13.3 13.4 13.2  PLT 264 260 219    Cardiac EnzymesNo results for input(s): TROPONINI in the last 168 hours. No results for input(s): TROPIPOC in the last  168 hours.   BNPNo results for input(s): BNP, PROBNP in the last 168 hours.   DDimer No results for input(s): DDIMER in the last 168 hours.   Radiology    No results found.  Cardiac Studies    Diagnostic Dominance: Right        Echo from 09/23/19:    1. Left ventricular ejection fraction, by estimation, is 60 to 65%. The  left ventricle has normal function. The left ventricle has no regional  wall motion abnormalities. Left ventricular diastolic parameters are  consistent with Grade I diastolic dysfunction (impaired relaxation).   2. Right ventricular systolic function is normal. The right ventricular  size is normal. Tricuspid regurgitation signal is inadequate for assessing  PA pressure.   3. Aortic valve regurgitation is not visualized. moderate aortic valve  sclerosis/calcification is present, without any evidence of aortic  stenosis.  Patient Profile     80 y.o. 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  We have reviewed his films again and there is nothing that would allow intervention.  In addition his EKG though abnormal is not changed from previous.  He is not having any active chest pain.  Yesterday I reduced his amlodipine and went up on his carvedilol.  I will stop the amlodipine and increase the carvedilol again today.  He can continue his nitrates.   Acute diastolic heart failure :   Net negative 2.3  liters .  I will give him another 40 mg of Lasix today.  I will order another BMP for tomorrow.  Leukocytosis:  WBC going down yesterday.  Repeat CBC in the morning  HTN:    We will need to follow closely his BP.   HLD:     Continue current therapy.   Type 2 DM:   Continue current therapy.    For questions or updates, please contact [place] Please consult [email or web address] for contact info under Cardiology/STEMI.   Signed, Minus Breeding, MD  10/07/2020, 9:59 AM

## 2020-10-07 NOTE — Progress Notes (Signed)
   10/07/20 0935  Clinical Encounter Type  Visited With Health care provider  Visit Type Initial  Referral From Nurse   Chaplain responded to a code STEMI. No family is present at this time and according to pt's RN, no needs at this time. Spiritual care services available as needed.   Jeri Lager, Chaplain

## 2020-10-07 NOTE — Progress Notes (Signed)
hsTroponin returned 9,644. D/w Dr. Percival Spanish - he is not surprised given degree of coronary disease this patient has and recommends continued medical management, not felt to be interventional candidate. Per our discussions we will trend troponins to evaluate for peak but otherwise continue plan as previously outlined.

## 2020-10-08 DIAGNOSIS — Z951 Presence of aortocoronary bypass graft: Secondary | ICD-10-CM

## 2020-10-08 DIAGNOSIS — E785 Hyperlipidemia, unspecified: Secondary | ICD-10-CM

## 2020-10-08 DIAGNOSIS — E119 Type 2 diabetes mellitus without complications: Secondary | ICD-10-CM

## 2020-10-08 DIAGNOSIS — I255 Ischemic cardiomyopathy: Secondary | ICD-10-CM

## 2020-10-08 DIAGNOSIS — D72829 Elevated white blood cell count, unspecified: Secondary | ICD-10-CM

## 2020-10-08 DIAGNOSIS — E1169 Type 2 diabetes mellitus with other specified complication: Secondary | ICD-10-CM

## 2020-10-08 DIAGNOSIS — E876 Hypokalemia: Secondary | ICD-10-CM | POA: Clinically undetermined

## 2020-10-08 DIAGNOSIS — I5033 Acute on chronic diastolic (congestive) heart failure: Secondary | ICD-10-CM | POA: Diagnosis present

## 2020-10-08 DIAGNOSIS — I214 Non-ST elevation (NSTEMI) myocardial infarction: Principal | ICD-10-CM

## 2020-10-08 DIAGNOSIS — I252 Old myocardial infarction: Secondary | ICD-10-CM | POA: Clinically undetermined

## 2020-10-08 DIAGNOSIS — I10 Essential (primary) hypertension: Secondary | ICD-10-CM

## 2020-10-08 LAB — BASIC METABOLIC PANEL
Anion gap: 11 (ref 5–15)
BUN: 9 mg/dL (ref 8–23)
CO2: 25 mmol/L (ref 22–32)
Calcium: 8.9 mg/dL (ref 8.9–10.3)
Chloride: 96 mmol/L — ABNORMAL LOW (ref 98–111)
Creatinine, Ser: 0.77 mg/dL (ref 0.61–1.24)
GFR, Estimated: >60 mL/min (ref >60)
Glucose, Bld: 126 mg/dL — ABNORMAL HIGH (ref 70–99)
Potassium: 3.2 mmol/L — ABNORMAL LOW (ref 3.5–5.1)
Sodium: 132 mmol/L — ABNORMAL LOW (ref 135–145)

## 2020-10-08 LAB — CBC
HCT: 35.6 % — ABNORMAL LOW (ref 39.0–52.0)
Hemoglobin: 12.3 g/dL — ABNORMAL LOW (ref 13.0–17.0)
MCH: 31.1 pg (ref 26.0–34.0)
MCHC: 34.6 g/dL (ref 30.0–36.0)
MCV: 90.1 fL (ref 80.0–100.0)
Platelets: 202 10*3/uL (ref 150–400)
RBC: 3.95 MIL/uL — ABNORMAL LOW (ref 4.22–5.81)
RDW: 13.3 % (ref 11.5–15.5)
WBC: 17.1 10*3/uL — ABNORMAL HIGH (ref 4.0–10.5)
nRBC: 0 % (ref 0.0–0.2)

## 2020-10-08 LAB — GLUCOSE, CAPILLARY
Glucose-Capillary: 143 mg/dL — ABNORMAL HIGH (ref 70–99)
Glucose-Capillary: 136 mg/dL — ABNORMAL HIGH (ref 70–99)
Glucose-Capillary: 133 mg/dL — ABNORMAL HIGH (ref 70–99)
Glucose-Capillary: 163 mg/dL — ABNORMAL HIGH (ref 70–99)

## 2020-10-08 LAB — TROPONIN I (HIGH SENSITIVITY): Troponin I (High Sensitivity): 13466 ng/L (ref <18)

## 2020-10-08 MED ORDER — POTASSIUM CHLORIDE 20 MEQ PO PACK
40.0000 meq | PACK | Freq: Every day | ORAL | Status: DC
  Administered 2020-10-09: 40 meq via ORAL
  Filled 2020-10-08: qty 2

## 2020-10-08 MED ORDER — FUROSEMIDE 40 MG PO TABS
40.0000 mg | ORAL_TABLET | Freq: Every day | ORAL | Status: DC
  Administered 2020-10-08: 40 mg via ORAL
  Filled 2020-10-08: qty 1

## 2020-10-08 MED ORDER — ISOSORBIDE MONONITRATE ER 30 MG PO TB24
15.0000 mg | ORAL_TABLET | Freq: Every day | ORAL | Status: DC
  Administered 2020-10-08: 15 mg via ORAL
  Filled 2020-10-08: qty 1

## 2020-10-08 MED ORDER — POTASSIUM CHLORIDE 20 MEQ PO PACK
40.0000 meq | PACK | Freq: Two times a day (BID) | ORAL | Status: AC
  Administered 2020-10-08 (×2): 40 meq via ORAL
  Filled 2020-10-08: qty 2

## 2020-10-08 NOTE — Progress Notes (Signed)
Progress Note  Patient Name: Jacob Harrison Date of Encounter: 10/08/2020  Primary Cardiologist:   Nelva Bush, MD  Patient Profile     80 y.o. male with history of extensive CAD (initial CABG in 2001 and redo CABG in 2012 after being found to have an atretic LIMA and occluded vein graft to the RCA and OM.  Redo CABG was for an SVG to RCA and SVG to the LAD), along with DM-2, HFpEF, HTN, HLD as well as chronic leg pain from spinal stenosis referred for cardiac catheterization by Dr. And Dr. Being seen on 10/03/2020 for unstable angina. He underwent cardiac catheterization on 10/04/2020 and found to have severe disease reviewed above.  Likely culprit for his symptoms is the occluded repeat SVG-RCA with flush occlusion of the SVG-LAD leaving him being perfused solely by a major LCx and several septal perforators from the residual LAD that fills a large distal RCA-PL-PDA system.  He was also noted to have elevated LVEDP.  Initial plans for discharge post cath were converted to an admission because of acute HFpEF exacerbation with acute hypoxia and dyspnea with rales on exam.  He has been treated throughout the weekend with IV Lasix, and has had medications titrated for antianginal effect.  In the course of the hospitalization he has had troponin levels checked which have peaked just about 13,000 and there was an EKG that suggested possible inferior STEMI.  More than likely this is the residual effect of the occluded SVG to RCA.  Does not meet full criteria for STEMI but would be criteria for non-STEMI -> I suspect that his symptoms were related to this vein graft closing down.  Unfortunately it was not amenable to revascularization based on the length of occlusion of the graft.  Attempted intervention on the vein graft would have been futile.  Original plan based on his intolerance of higher doses of beta-blocker was to increase amlodipine however this was changed by the rounding physician  this weekend and he is no longer on amlodipine to allow for titration of beta-blocker.   Subjective   Today he notes that he has not had any further anginal symptoms.  His breathing is okay, better with sitting up and laying down.  He is gets a tightness in his chest when he takes deep breath laying down, but that is better when he sits up.  Overall he states that he is feeling better but just somewhat tired.  He is leery of medication changes. Has not really walk around much.  Went to the bathroom and noted that his heart rate went up and he got little short of breath.  Had some sweats at night, but no fevers or chills  Inpatient Medications    Scheduled Meds:  aspirin EC  81 mg Oral Daily   carvedilol  12.5 mg Oral BID WC   furosemide  40 mg Oral Daily   insulin aspart  0-9 Units Subcutaneous TID WC   isosorbide mononitrate  15 mg Oral QHS   potassium chloride  40 mEq Oral BID   [START ON 10/09/2020] potassium chloride  40 mEq Oral Daily   ranolazine  1,000 mg Oral BID   rosuvastatin  20 mg Oral QHS   sodium chloride flush  3 mL Intravenous Q12H   Continuous Infusions:  sodium chloride     PRN Meds: sodium chloride, acetaminophen, nitroGLYCERIN, ondansetron (ZOFRAN) IV, sodium chloride flush   Vital Signs    Vitals:   10/08/20 0501 10/08/20  6384 10/08/20 1000 10/08/20 1127  BP: 100/66 104/67 90/67 95/64   Pulse: 83 94  80  Resp: 18 20  18   Temp: 99 F (37.2 C)   97.7 F (36.5 C)  TempSrc: Oral   Oral  SpO2: 93% 92%  95%  Weight: 82.3 kg     Height:        Intake/Output Summary (Last 24 hours) at 10/08/2020 1219 Last data filed at 10/08/2020 1129 Gross per 24 hour  Intake 840 ml  Output 2000 ml  Net -1160 ml   Filed Weights   10/06/20 0618 10/07/20 0448 10/08/20 0501  Weight: 82.6 kg 82.1 kg 82.3 kg    Telemetry    Sinus tachycardia- Personally Reviewed  ECG    Normal sinus rhythm.  Subtle inferior ST elevation noted but stable..- Personally  Reviewed  Physical Exam   GEN: Lying in bed, no acute distress.  Looks comfortable. Neck: No  JVD, + HJR Cardiac: RRR with normal S1 and S2.  No M/R/G. Respiratory:    Nonlabored, but decreased basal breath sounds.  Fine basal crackles worse on the right. GI: Soft/NT/ND STEMI BS.  No extent. MS: Trivial to 1+ bilateral LE edema; otherwise no deformity. Neuro:   Nonfocal, CN II-XII grossly intact.  Psych:  Awake and alert/oriented x4; normal mood and affect  Labs    Chemistry Recent Labs  Lab 10/05/20 0754 10/06/20 0147 10/08/20 0218  NA 138 133* 132*  K 3.7 3.8 3.2*  CL 103 98 96*  CO2 26 26 25   GLUCOSE 141* 134* 126*  BUN 9 9 9   CREATININE 0.82 0.81 0.77  CALCIUM 9.3 9.0 8.9  GFRNONAA >60 >60 >60  ANIONGAP 9 9 11      Hematology Recent Labs  Lab 10/05/20 0754 10/06/20 0147 10/08/20 0218  WBC 21.5* 19.6* 17.1*  RBC 4.53 4.12* 3.95*  HGB 14.2 13.0 12.3*  HCT 41.5 37.1* 35.6*  MCV 91.6 90.0 90.1  MCH 31.3 31.6 31.1  MCHC 34.2 35.0 34.6  RDW 13.4 13.2 13.3  PLT 260 219 202    Cardiac Enzymes Troponin I 13, 466  BNPNo results for input(s): BNP, PROBNP in the last 168 hours.   DDimer No results for input(s): DDIMER in the last 168 hours.   Radiology    No results found.  Cardiac Studies   Cardiac Cath 10/05/2019:Severe two-vessel native CAD: Long left main the trifurcates into LAD, LCx and RI. The LAD is subtotally occluded after major septal perforator (that provides collaterals to the PDA).  After the septal perforator the vessel essentially occluded yet still gives rise to a diagonal branch and 2 more septal perforators.  There are bridging collaterals filling the distal vessel with faint late filling.  The diagonal branch is small in caliber, but provides collaterals to the distal ramus as well as bridging collaterals to the LAD.  (The 2 small septal perforators do provide collaterals to the PDA) Moderate caliber RI is occluded proximally LCx is roughly  40% mid stenosis at bifurcation into major lateral OM and AV groove branch.  Both branches provide collaterals to the RPL system. RCA 100% proximal occluded.  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 There are ring markers indicating likely the new grafts:  New SVG-RPDA 100% proximal occlusion.  There is roughly 20 mm of dye hang up indicating relatively recent occlusion-likely culprit lesion.  Not likely to be salvaged.  Given the  extent of collateral flow to the distal RCA system, likely competitive flow would keep the graft closed. Subacute to chronic flush occlusion of the new SVG-LAD.  Moderate to Severely Elevated LVEDP c/w Diastolic CHF (with ongoing CP & SOB - Acute on Chronic)   Diagnostic Dominance: Right    Echo from 09/23/19: EF 60 to 65%.  Normal LV function.  No obvious R WMA.  GR 1 DD.  Unable assess PAP.  Moderate aortic valve sclerosis/calcification with no stenosis.       Assessment & Plan      Principal Problem:   Non-STEMI (non-ST elevated myocardial infarction) ([place]) Active Problems:   Coronary artery disease involving native coronary artery of native heart with unstable angina pectoris (HCC)   Acute on chronic heart failure with preserved ejection fraction (HFpEF) ([place])   Type 2 diabetes mellitus without complication, without long-term current use of insulin (HCC)   Hx of CABG   Essential hypertension   Hypokalemia due to loss of potassium   Hyperlipidemia associated with type 2 diabetes mellitus ([place])   Ischemic cardiomyopathy   Principal Problems:   Non-STEMI (non-ST elevated myocardial infarction) Coronary artery disease involving native coronary artery of native heart with unstable angina pectoris (HCC) ([place]) /  Hx of CABG -> now with occluded grafts. I suspect that his positive troponins and EKG changes are related to the occluded vein graft this would go along with the inferior  EKG changes.  I suspect that this was a subacute occlusion.  Interestingly, his numbers went up as opposed to down which would suggest that there is a component of demand ischemia from heart failure as well.  I suspect his EF probably is not still in the 60 to 65% range -> may not be unreasonable to recheck an echocardiogram in the next month or so once we have had time to titrate medications. We will continue with the plan for this weekend -> amlodipine as antianginal was discontinued with the intention to increase carvedilol.  He seems to be doing okay with a 12.5 mg carvedilol but has not been up to walk around yet.  Blood pressures are still borderline in the high 90s to low 100s. Will change Imdur to 15 mg nightly (has been held as of yet for today.) On Ranexa 1000 mg twice daily. Continue aspirin, and with elevated troponin will start back on Plavix based on ACS protocol.  Acute on chronic heart failure with preserved ejection fraction (HFpEF) (HCC) /  Ischemic cardiomyopathy / Hypokalemia due to loss of potassium I suspect that this was a subacute occlusion.  Interestingly, his numbers went up as opposed to down which would suggest that there is a component of demand ischemia from heart failure as well.  I suspect his EF probably is not still in the 60 to 65% range -> may not be unreasonable to recheck an echocardiogram in the next month or so once we have had time to titrate medications. He did not get Lasix yesterday.  We will convert to oral Lasix today-40 mg, after getting his first dose of potassium. ->  We will continue with 40 mg daily Will give 40 mEq twice daily today and then once daily starting tomorrow Recheck chemistry panel and BNP tomorrow  Active Problems:    Hyperlipidemia associated with type 2 diabetes mellitus (HCC)/ Type 2 diabetes mellitus without complication, without long-term current use of insulin (HCC) On metformin at home. ->  Can likely restart tomorrow as he is now  more than 48 hours post cath. Monitoring with sliding scale Continue rosuvastatin and Zetia.     Essential hypertension -> not exactly initiated at this point as he is somewhat hypotensive on 12.5 mg twice daily carvedilol.    Leukocytosis, levels seem to be going down.  No signs of infection.   Dispo: We will have him ambulate today in the room in the hallway.  Will need to be reevaluated by cardiac rehab in the morning to determine his stability for discharge.  Recheck BMP and BNP in the morning after dosing with Lasix. I suspect that he still have some borderline low blood pressures on the carvedilol dose as this has been an issue in the past.  We need to be prepared for drops in blood pressure and perhaps reduce daytime dose to 6.25 mg and 12.5 mg at nighttime.     For questions or updates, please contact [place] Please consult [email or web address] for contact info under Cardiology/STEMI.   Signed, Glenetta Hew, MD  10/08/2020, 12:19 PM

## 2020-10-09 LAB — BASIC METABOLIC PANEL
Anion gap: 10 (ref 5–15)
BUN: 14 mg/dL (ref 8–23)
CO2: 24 mmol/L (ref 22–32)
Calcium: 8.7 mg/dL — ABNORMAL LOW (ref 8.9–10.3)
Chloride: 97 mmol/L — ABNORMAL LOW (ref 98–111)
Creatinine, Ser: 0.75 mg/dL (ref 0.61–1.24)
GFR, Estimated: >60 mL/min (ref >60)
Glucose, Bld: 122 mg/dL — ABNORMAL HIGH (ref 70–99)
Potassium: 3.6 mmol/L (ref 3.5–5.1)
Sodium: 131 mmol/L — ABNORMAL LOW (ref 135–145)

## 2020-10-09 LAB — BRAIN NATRIURETIC PEPTIDE: B Natriuretic Peptide: 695.5 pg/mL — ABNORMAL HIGH (ref 0.0–100.0)

## 2020-10-09 LAB — GLUCOSE, CAPILLARY
Glucose-Capillary: 129 mg/dL — ABNORMAL HIGH (ref 70–99)
Glucose-Capillary: 203 mg/dL — ABNORMAL HIGH (ref 70–99)

## 2020-10-09 MED ORDER — CLOPIDOGREL BISULFATE 75 MG PO TABS: 75.0000 mg | ORAL_TABLET | Freq: Every day | ORAL | 1 refills | Status: DC

## 2020-10-09 MED ORDER — METFORMIN HCL 500 MG PO TABS: 250.0000 mg | ORAL_TABLET | Freq: Every day | ORAL | Status: DC

## 2020-10-09 MED ORDER — FINASTERIDE 5 MG PO TABS
5.0000 mg | ORAL_TABLET | Freq: Every day | ORAL | Status: DC
  Administered 2020-10-09: 5 mg via ORAL
  Filled 2020-10-09: qty 1

## 2020-10-09 MED ORDER — EZETIMIBE 10 MG PO TABS
10.0000 mg | ORAL_TABLET | Freq: Every day | ORAL | Status: DC
  Administered 2020-10-09: 10 mg via ORAL
  Filled 2020-10-09: qty 1

## 2020-10-09 MED ORDER — CLOPIDOGREL BISULFATE 75 MG PO TABS
75.0000 mg | ORAL_TABLET | Freq: Every day | ORAL | Status: DC
  Administered 2020-10-09: 75 mg via ORAL
  Filled 2020-10-09: qty 1

## 2020-10-09 MED ORDER — ISOSORBIDE MONONITRATE ER 30 MG PO TB24: 15.0000 mg | ORAL_TABLET | Freq: Every day | ORAL | 2 refills | Status: DC

## 2020-10-09 MED ORDER — CARVEDILOL 12.5 MG PO TABS: 12.5000 mg | ORAL_TABLET | Freq: Two times a day (BID) | ORAL | 2 refills | Status: DC

## 2020-10-09 MED ORDER — FUROSEMIDE 10 MG/ML IJ SOLN
40.0000 mg | Freq: Once | INTRAMUSCULAR | Status: AC
  Administered 2020-10-09: 40 mg via INTRAVENOUS
  Filled 2020-10-09: qty 4

## 2020-10-09 MED ORDER — POTASSIUM CHLORIDE CRYS ER 20 MEQ PO TBCR: 40.0000 meq | EXTENDED_RELEASE_TABLET | Freq: Once | ORAL | Status: DC

## 2020-10-09 NOTE — Progress Notes (Addendum)
Progress Note  Patient Name: Jacob Harrison Date of Encounter: 10/09/2020  [facility] Cardiologist: Nelva Bush, MD   Subjective   Breathing is better but remains on [place] @2L . Has not been up to ambulate in the hallway yet. No chest pain this morning.   Inpatient Medications    Scheduled Meds:  aspirin EC  81 mg Oral Daily   carvedilol  12.5 mg Oral BID WC   clopidogrel  75 mg Oral Daily   ezetimibe  10 mg Oral Daily   finasteride  5 mg Oral Daily   furosemide  40 mg Intravenous Once   insulin aspart  0-9 Units Subcutaneous TID WC   isosorbide mononitrate  15 mg Oral QHS   metFORMIN  250 mg Oral Q breakfast   potassium chloride  40 mEq Oral Daily   potassium chloride  40 mEq Oral Once   ranolazine  1,000 mg Oral BID   rosuvastatin  20 mg Oral QHS   sodium chloride flush  3 mL Intravenous Q12H   Continuous Infusions:  sodium chloride     PRN Meds: sodium chloride, acetaminophen, nitroGLYCERIN, ondansetron (ZOFRAN) IV, sodium chloride flush   Vital Signs    Vitals:   10/08/20 2019 10/08/20 2315 10/09/20 0312 10/09/20 0439  BP: 110/78 97/65  95/63  Pulse: 99 87  78  Resp: 20 17  19   Temp: 98.1 F (36.7 C) 99.2 F (37.3 C)  98.6 F (37 C)  TempSrc: Oral Axillary  Oral  SpO2: 92% 96%  97%  Weight:   82.4 kg   Height:        Intake/Output Summary (Last 24 hours) at 10/09/2020 0825 Last data filed at 10/09/2020 0706 Gross per 24 hour  Intake 1200 ml  Output 2575 ml  Net -1375 ml   Last 3 Weights 10/09/2020 10/08/2020 10/07/2020  Weight (lbs) 181 lb 9.6 oz 181 lb 6.4 oz 181 lb 1.6 oz  Weight (kg) 82.373 kg 82.283 kg 82.146 kg      Telemetry    SR with PVCs - Personally Reviewed  ECG    No new tracing   Physical Exam   GEN: No acute distress.   Neck: mild JVD Cardiac: RRR, no murmurs, rubs, or gallops.  Respiratory: Right sided crackles GI: Soft, nontender, non-distended  MS: No edema; No deformity. Neuro:  Nonfocal  Psych: Normal affect    Labs    High Sensitivity Troponin:   Recent Labs  Lab 10/07/20 0926 10/07/20 1324 10/08/20 0548  TROPONINIHS 9,644* 12,384* 13,466*      Chemistry Recent Labs  Lab 10/06/20 0147 10/08/20 0218 10/09/20 0244  NA 133* 132* 131*  K 3.8 3.2* 3.6  CL 98 96* 97*  CO2 26 25 24   GLUCOSE 134* 126* 122*  BUN 9 9 14   CREATININE 0.81 0.77 0.75  CALCIUM 9.0 8.9 8.7*  GFRNONAA >60 >60 >60  ANIONGAP 9 11 10      Hematology Recent Labs  Lab 10/05/20 0754 10/06/20 0147 10/08/20 0218  WBC 21.5* 19.6* 17.1*  RBC 4.53 4.12* 3.95*  HGB 14.2 13.0 12.3*  HCT 41.5 37.1* 35.6*  MCV 91.6 90.0 90.1  MCH 31.3 31.6 31.1  MCHC 34.2 35.0 34.6  RDW 13.4 13.2 13.3  PLT 260 219 202    BNP Recent Labs  Lab 10/09/20 0244  BNP 695.5*     DDimer No results for input(s): DDIMER in the last 168 hours.   Radiology    No results found.  Cardiac Studies   Cardiac Cath 10/05/2019:Severe two-vessel native CAD:  Long left main the trifurcates into LAD, LCx and RI. The LAD is subtotally occluded after major septal perforator (that provides collaterals to the PDA).  After the septal perforator the vessel essentially occluded yet still gives rise to a diagonal branch and 2 more septal perforators.  There are bridging collaterals filling the distal vessel with faint late filling.  The diagonal branch is small in caliber, but provides collaterals to the distal ramus as well as bridging collaterals to the LAD.  (The 2 small septal perforators do provide collaterals to the PDA) Moderate caliber RI is occluded proximally LCx is roughly 40% mid stenosis at bifurcation into major lateral OM and AV groove branch.  Both branches provide collaterals to the RPL system. RCA 100% proximal occluded.   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 There are ring markers indicating likely the new grafts:  New SVG-RPDA 100% proximal  occlusion.  There is roughly 20 mm of dye hang up indicating relatively recent occlusion-likely culprit lesion.  Not likely to be salvaged.  Given the extent of collateral flow to the distal RCA system, likely competitive flow would keep the graft closed. Subacute to chronic flush occlusion of the new SVG-LAD.   Moderate to Severely Elevated LVEDP c/w Diastolic CHF (with ongoing CP & SOB - Acute on Chronic)   Diagnostic Dominance: Right    Echo from 09/23/19:  EF 60 to 65%.  Normal LV function.  No obvious R WMA.  GR 1 DD.  Unable assess PAP.  Moderate aortic valve sclerosis/calcification with no stenosis.    Patient Profile     80 y.o. male with history of extensive CAD (initial CABG in 2001 and redo CABG in 2012 after being found to have an atretic LIMA and occluded vein graft to the RCA and OM.  Redo CABG was for an SVG to RCA and SVG to the LAD), along with DM-2, HFpEF, HTN, HLD as well as chronic leg pain from spinal stenosis referred for cardiac catheterization by Dr. Saunders Revel. Developed pulmonary edema post cath requiring IV diuresis.   Assessment & Plan    NSTEMI: hsTn peaked 13466, which was felt to be related to occluded SVG which correlated with inferior EKG changes. Recommendations for continued medical therapy. No chest pain overnight.  -- continue on coreg 12.5mg  BID, Imdur 15mg  PM, ranexa 1000mg  BID, ASA and plavix -- needs to ambulate in the hallway today to evaluate symptoms and O2 requirement  HFpEF: developed acute pulmonary edema post cath. Has been diuresing with IV lasix -- net - 4.8L -- still with crackles on exam, will re-dose with IV lasix 40mg  x1 this morning -- remains on [place] @2L , will need to walk this morning to determine O2 needs at discharge.   HLD: on crestor and zetia  DM: will restart metformin today -- on SSI  HTN: Blood pressures are marginal, but tolerating meds at present  Leukocytosis: levels are trending down, no signs of infection  For questions  or updates, please contact [facility] Please consult [email or web address] for contact info under     Signed, Reino Bellis, NP  10/09/2020, 8:25 AM    Patient seen, examined. Available data reviewed. Agree with findings, assessment, and plan as outlined by Reino Bellis, NP.  The patient is independently interviewed and examined.  He is alert, oriented, in no distress.  He is currently  on room air with an oxygen saturation of 96%.  JVP is normal, heart is regular rate and rhythm with no murmur gallop, lungs are diminished in the bases with mild end expiratory wheezing but otherwise clear.  Abdomen is soft and nontender, extremities have no edema.  Notes reviewed.  Medical regimen is reviewed.  His oxygen saturations were 88 to 92% with ambulation this morning on room air.  I think he has benefited from an additional dose of IV furosemide and is medically stable for discharge home this afternoon on furosemide 40 mg daily.  Otherwise continue current medical program.  Clopidogrel has been added to his regimen.  Sherren Mocha, M.D. 10/09/2020 11:51 AM

## 2020-10-09 NOTE — Discharge Summary (Addendum)
Discharge Summary    Patient ID: Jacob Harrison MRN: [id number]; DOB: 1941-03-11  Admit date: 10/04/2020 Discharge date: 10/09/2020  PCP:  [name], [place] Providers Cardiologist:  Nelva Bush, MD   {  Discharge Diagnoses    Principal Problem:   Non-STEMI (non-ST elevated myocardial infarction) [facility]) Active Problems:   Type 2 diabetes mellitus without complication, without long-term current use of insulin ([place])   Essential hypertension   Hyperlipidemia associated with type 2 diabetes mellitus ([place])   Ischemic cardiomyopathy   Coronary artery disease involving native coronary artery of native heart with unstable angina pectoris ([place])   Hx of CABG   Acute on chronic heart failure with preserved ejection fraction (HFpEF) ([facility])   Hypokalemia due to loss of potassium    Diagnostic Studies/Procedures    Cardiac Cath 10/05/2019:Severe two-vessel native CAD:   Long left main the trifurcates into LAD, LCx and RI. The LAD is subtotally occluded after major septal perforator (that provides collaterals to the PDA).  After the septal perforator the vessel essentially occluded yet still gives rise to a diagonal branch and 2 more septal perforators.  There are bridging collaterals filling the distal vessel with faint late filling.  The diagonal branch is small in caliber, but provides collaterals to the distal ramus as well as bridging collaterals to the LAD.  (The 2 small septal perforators do provide collaterals to the PDA) Moderate caliber RI is occluded proximally LCx is roughly 40% mid stenosis at bifurcation into major lateral OM and AV groove branch.  Both branches provide collaterals to the RPL system. RCA 100% proximal occluded.   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 There are ring markers indicating likely the new grafts:  New SVG-RPDA 100% proximal occlusion.  There is  roughly 20 mm of dye hang up indicating relatively recent occlusion-likely culprit lesion.  Not likely to be salvaged.  Given the extent of collateral flow to the distal RCA system, likely competitive flow would keep the graft closed. Subacute to chronic flush occlusion of the new SVG-LAD.   Moderate to Severely Elevated LVEDP c/w Diastolic CHF (with ongoing CP & SOB - Acute on Chronic)   Diagnostic Dominance: Right    _____________   History of Present Illness     Jacob Harrison is a 80 y.o. male with history of CAD status post CABG and redo CABG (see details below), diabetes, HFpEF, hyperlipidemia, and chronic leg pain in the setting of spinal stenosis, who presented for follow-up with Dr. Saunders Harrison. Prior to that he was seen in February, which time he was doing well with no significant change in his chronic exertional dyspnea that has been present for years.  He previously noted improvement in his shortness of breath with addition of furosemide.  They agreed to increase furosemide to 40 mg daily at our last visit.  He contacted the office due to some dizziness after making this medication change, particularly when leaning his head back.  We discussed going back to 20 mg daily, though Jacob Harrison has remained on the 40 mg daily dose.  He has undergone neurologic evaluation for potential causes of his dizziness without clear etiology.  Jacob Harrison reported that he has been experiencing more frequent chest tightness over the last month.  He initially tried an antacid without any relief but found that nitroglycerin usually makes the pain go away quite quickly.  He  was using nitroglycerin 3-5 times per day, often having pain with mild activity.  Over the last week prior to office visit, he had 2 episodes of chest tightness when lying down at night, both of which resolved with sublingual nitroglycerin. Given this, he was set up for outpatient cardiac cath.   Hospital Course     NSTEMI: Underwent  cardiac cath noted above with Dr. Ellyn Hack with extensive disease.  Likely culprit for his symptoms was felt to be the occluded repeat SVG-RCA with flush occlusion of the SVG-LAD leaving him being perfused solely by a major LCx and several septal perforators from the residual LAD that fills a large distal RCA-PL-PDA system.  He was also noted to have elevated LVEDP. hsTn peaked 13466, which was felt to be related to occluded SVG which correlated with inferior EKG changes. Recommendations for continued medical therapy.  -- continue on coreg 12.5mg  BID, Imdur 15mg  PM (started this admission), ranexa 1000mg  BID, ASA and plavix (started per Dr. Allison Quarry recommendations) -- Able to ambulate in the hallway without significant recurrent chest pain with cardiac rehab   HFpEF: developed acute pulmonary edema post cath after receiving precath IV fluids.  Significant volume overload while in short stay with crackles on exam. Has been diuresed with IV lasix -- net - 5L -- He was able to ambulate and maintain O2 sats without nasal cannula. -- Discharged on home dose of 40 mg of Lasix daily  HLD: on crestor and zetia   DM: He was restarted on metformin -- on SSI   HTN: Blood pressures are marginal, but tolerating meds at present -- Unable to further titrate while inpatient   Leukocytosis: WBC peaked at 21.5 with downward trend.  Suspect reactive in the setting of acute CHF.  Remained afebrile, no signs of infection.  Patient was seen by Dr. Burt Knack and deemed stable for discharge.  Follow-up in the office has been arranged.  Medications sent to patient's pharmacy prior to discharge.  Did the patient have an acute coronary syndrome (MI, NSTEMI, STEMI, etc) this admission?:  No.   The elevated Troponin was due to the acute medical illness (demand ischemia).      _____________  Discharge Vitals Blood pressure 100/61, pulse 83, temperature 98.6 F (37 C), temperature source Oral, resp. rate 19, height 5'  7" (1.702 m), weight 82.4 kg, SpO2 95 %.  Filed Weights   10/07/20 0448 10/08/20 0501 10/09/20 0312  Weight: 82.1 kg 82.3 kg 82.4 kg    Labs & Radiologic Studies    CBC Recent Labs    10/08/20 0218  WBC 17.1*  HGB 12.3*  HCT 35.6*  MCV 90.1  PLT 371   Basic Metabolic Panel Recent Labs    10/08/20 0218 10/09/20 0244  NA 132* 131*  K 3.2* 3.6  CL 96* 97*  CO2 25 24  GLUCOSE 126* 122*  BUN 9 14  CREATININE 0.77 0.75  CALCIUM 8.9 8.7*   Liver Function Tests No results for input(s): AST, ALT, ALKPHOS, BILITOT, PROT, ALBUMIN in the last 72 hours. No results for input(s): LIPASE, AMYLASE in the last 72 hours. High Sensitivity Troponin:   Recent Labs  Lab 10/07/20 0926 10/07/20 1324 10/08/20 0548  TROPONINIHS 9,644* 12,384* 13,466*    BNP Invalid input(s): POCBNP D-Dimer No results for input(s): DDIMER in the last 72 hours. Hemoglobin A1C No results for input(s): HGBA1C in the last 72 hours. Fasting Lipid Panel No results for input(s): CHOL, HDL, LDLCALC, TRIG, CHOLHDL, LDLDIRECT in the  last 72 hours. Thyroid Function Tests No results for input(s): TSH, T4TOTAL, T3FREE, THYROIDAB in the last 72 hours.  Invalid input(s): FREET3 _____________  DG Chest 2 View  Result Date: 10/04/2020 CLINICAL DATA:  Postop EXAM: CHEST - 2 VIEW COMPARISON:  10/13/2018 FINDINGS: Postop CABG. Mild to moderate diffuse bilateral airspace disease has developed since the prior study. Chronic left pleural thickening is unchanged. No right pleural effusion. IMPRESSION: Mild to moderate diffuse bilateral airspace disease. Favor edema although pneumonia possible. Electronically Signed   By: Franchot Gallo M.D.   On: 10/04/2020 15:17   CARDIAC CATHETERIZATION  Result Date: 10/04/2020 Formatting of this result is different from the original.  -----SEVERE ~ 3 VESSEL NATIVE CAD------  Mid LAD to Dist LAD lesion is 99% stenosed.  Mid Cx lesion is 40% stenosed with 40% stenosed side branch in  LPAV.  Prox RCA to Dist RCA lesion is 100% stenosed.  ---- GRAFTS-----  SVG-RPDA graft was visualized by angiography. Prox Graft to Insertion lesion is 100% stenosed.  SVG-LAD graft was visualized by angiography. Origin to Prox Graft lesion is 100% stenosed. (flush occluded)  Unable to Visualize old Grafts (original SVG-rPDA & SVG-LCx). 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  ------  LV end diastolic pressure is moderately elevated.  There is no aortic valve stenosis.  SUMMARY  Severe native CAD: ? Long left main the trifurcates into LAD, LCx and RI. - The LAD is subtotally occluded after major septal perforator (that provides collaterals to the PDA).  After the septal perforator the vessel essentially occluded yet still gives rise to a diagonal branch and 2 more septal perforators.  There are bridging collaterals filling the distal vessel with faint late filling.  The diagonal branch is small in caliber, but provides collaterals to the distal ramus as well as bridging collaterals to the LAD.  (The 2 small septal perforators do provide collaterals to the PDA) - Moderate caliber RI is occluded proximally - LCx is roughly 40% mid stenosis at bifurcation into major lateral OM and AV groove branch.  Both branches provide collaterals to the RPL system. ? RCA 100% proximal occluded.   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  There are ring markers indicating likely the new grafts:  New SVG-RPDA 100% proximal occlusion.  There is roughly 20 mm of dye hang up indicating relatively recent occlusion-likely culprit lesion.  Not likely to be salvaged.  Given the extent of collateral flow to the distal RCA system, likely competitive flow would keep the graft closed.  Subacute to chronic flush occlusion of the new SVG-LAD.  Moderate to Severely Elevated LVEDP c/w Diastolic CHF (with ongoing CP & SOB - Acute on Chronic) Glenetta Hew, MD   Disposition   Pt is being discharged home today in good condition.  Follow-up Plans & Appointments     Follow-up Information     End, Harrell Gave, MD Follow up on 10/25/2020.   Specialty: Cardiology Why: at 11:40am for your follow up appt Contact information: [address] [place] [address] [phone]                Discharge Instructions     Amb Referral to Cardiac Rehabilitation   Complete by: As directed    Diagnosis: NSTEMI   After initial evaluation and assessments completed: Virtual Based Care may be provided alone or in conjunction with Phase 2 Cardiac Rehab based  on patient barriers.: Yes   Call MD for:  difficulty breathing, headache or visual disturbances   Complete by: As directed    Call MD for:  persistant dizziness or light-headedness   Complete by: As directed    Diet - low sodium heart healthy   Complete by: As directed    Discharge instructions   Complete by: As directed    For patients with congestive heart failure, we give them these special instructions:  1. Follow a low-salt diet and watch your fluid intake. In general, you should not be taking in more than 2 liters of fluid per day (no more than 8 glasses per day). Some patients are restricted to less than 1.5 liters of fluid per day (no more than 6 glasses per day). This includes sources of water in foods like soup, coffee, tea, milk, etc. 2. Weigh yourself on the same scale at same time of day and keep a log. 3. Call your doctor: (Anytime you feel any of the following symptoms)  - 3-4 pound weight gain in 1-2 days or 2 pounds overnight  - Shortness of breath, with or without a dry hacking cough  - Swelling in the hands, feet or stomach  - If you have to sleep on extra pillows at night in order to breathe   IT IS IMPORTANT TO LET YOUR DOCTOR KNOW EARLY ON IF YOU ARE HAVING SYMPTOMS SO WE CAN HELP YOU!   Increase activity slowly   Complete by: As directed    Nursing  communication   Complete by: As directed    REMIND PATIENT TO HOLD METFORMIN X 48 HR POST CATH. -- RESTART PM OF 10/06/2020       Discharge Medications   Allergies as of 10/09/2020       Reactions   Other Itching, Rash   Steroid injection in 2021 caused rash/itching   Isosorbide Mononitrate [isosorbide Dinitrate Er]    Headaches   Tetracyclines & Related Rash        Medication List     STOP taking these medications    amLODipine 5 MG tablet Commonly known as: NORVASC       TAKE these medications    acetaminophen 500 MG tablet Commonly known as: TYLENOL Take 1,000 mg by mouth every 6 (six) hours as needed for moderate pain or headache.   aspirin EC 81 MG tablet Take 1 tablet (81 mg total) by mouth daily.   carvedilol 12.5 MG tablet Commonly known as: COREG Take 1 tablet (12.5 mg total) by mouth 2 (two) times daily with a meal. What changed:  medication strength how much to take   clopidogrel 75 MG tablet Commonly known as: PLAVIX Take 1 tablet (75 mg total) by mouth daily. Start taking on: October 10, 2020   Co Q-10 200 MG Caps Take 200 mg by mouth daily.   ezetimibe 10 MG tablet Commonly known as: ZETIA TAKE 1 TABLET BY MOUTH EVERY DAY   furosemide 40 MG tablet Commonly known as: LASIX Take 1 tablet (40 mg total) by mouth daily.   Glucosamine 1500 Complex Caps Take 1 capsule by mouth daily.   isosorbide mononitrate 30 MG 24 hr tablet Commonly known as: IMDUR Take 0.5 tablets (15 mg total) by mouth at bedtime.   metFORMIN 500 MG tablet Commonly known as: GLUCOPHAGE 1/2 tablet by mouth daily with food What changed:  how much to take how to take this when to take this additional instructions   nitroGLYCERIN 0.4  MG SL tablet Commonly known as: NITROSTAT Place 1 tablet (0.4 mg total) under the tongue every 5 (five) minutes as needed.   onetouch ultrasoft lancets Use to check blood sugars fasting daily   PRESERVISION AREDS 2 PO Take 1 capsule  by mouth 2 (two) times a day.   ranolazine 1000 MG SR tablet Commonly known as: RANEXA Take 1 tablet by mouth twice daily What changed:  how much to take how to take this when to take this additional instructions   vitamin B-12 500 MCG tablet Commonly known as: CYANOCOBALAMIN Take 500 mcg by mouth daily.   VITAMIN D-3 PO Take 800 Units by mouth daily.       ASK your doctor about these medications    diclofenac Sodium 1 % Gel Commonly known as: VOLTAREN Apply 2 g topically 2 (two) times daily as needed.   finasteride 5 MG tablet Commonly known as: PROSCAR TAKE 1 TABLET BY MOUTH EVERY DAY   rosuvastatin 20 MG tablet Commonly known as: CRESTOR TAKE 1 TABLET BY MOUTH EVERYDAY AT BEDTIME        Outstanding Labs/Studies   N/a  Duration of Discharge Encounter   Greater than 30 minutes including physician time.  Signed, Reino Bellis, NP 10/09/2020, 2:53 PM

## 2020-10-09 NOTE — Care Management Important Message (Signed)
Important Message  Patient Details  Name: Jacob Harrison MRN: [id number] Date of Birth: 1940/05/11   Medicare Important Message Given:  Yes     Barb Merino [place] 10/09/2020, 3:51 PM

## 2020-10-09 NOTE — Progress Notes (Signed)
CARDIAC REHAB PHASE I   PRE:  Rate/Rhythm: 88 SR    BP: sitting 103/61    SaO2: 95 2L--> 93 RA  MODE:  Ambulation: 190 ft   POST:  Rate/Rhythm: 108 ST    BP: sitting 118/80     SaO2: 89 RA--> 93 RA  Pt up in recliner on 2L. Able to ambulate on RA with SaO2 88-92 RA. However pt did begin to c/o SOB and fatigue toward end of short walk. Sts his typical angina starts with SOB, then chest tightness, then chest pain. Left pt off O2 in room and encouraged him to walk more independently today but to stay close to room and listen to his body.  Discussed MI, restrictions, diet, exercise, NTG and CRPII. Pt receptive. Will refer to [facility].  [phone]  [place], ACSM 10/09/2020 10:38 AM

## 2020-10-11 LAB — GLUCOSE, CAPILLARY: Glucose-Capillary: 139 mg/dL — ABNORMAL HIGH (ref 70–99)

## 2020-10-25 ENCOUNTER — Other Ambulatory Visit: Payer: Self-pay

## 2020-10-25 ENCOUNTER — Encounter: Payer: Self-pay | Admitting: Internal Medicine

## 2020-10-25 ENCOUNTER — Ambulatory Visit: Payer: Medicare Other | Admitting: Internal Medicine

## 2020-10-25 VITALS — BP 106/68 | HR 68 | Ht 67.0 in | Wt 180.0 lb

## 2020-10-25 DIAGNOSIS — I214 Non-ST elevation (NSTEMI) myocardial infarction: Secondary | ICD-10-CM

## 2020-10-25 DIAGNOSIS — E785 Hyperlipidemia, unspecified: Secondary | ICD-10-CM | POA: Diagnosis not present

## 2020-10-25 DIAGNOSIS — I5033 Acute on chronic diastolic (congestive) heart failure: Secondary | ICD-10-CM | POA: Diagnosis not present

## 2020-10-25 MED ORDER — FUROSEMIDE 40 MG PO TABS: 40.0000 mg | ORAL_TABLET | Freq: Two times a day (BID) | ORAL | 1 refills | Status: DC

## 2020-10-25 NOTE — Progress Notes (Signed)
Follow-up Outpatient Visit Date: 10/25/2020  Primary Care Provider: [name], PA-C [place] [id number]  Chief Complaint: Follow-up CAD and heart failure  HPI:  [name] is a 80 y.o. male with history of coronary artery disease status post CABG and redo CABG (see details below), type 2 diabetes mellitus, chronic HFpEF, hyperlipidemia, and chronic leg pain in the setting of spinal stenosis, who presents for follow-up of coronary artery disease with recent NSTEMI.  I last saw him in late June, at which time he complained of worsening chest pain over the preceding month.  He underwent urgent catheterization the next day revealing occlusion of all bypass grafts in the setting of severe native coronary artery disease including chronic total occlusions of the proximal LAD and proximal RCA.  It was thought that recent occlusion of SVG to the RCA was his culprit.  There were no targets for intervention.  The EDP was elevated at 22 mmHg.  [name] was admitted for diuresis and optimization of antianginal therapy.  His troponin was noted to rise during the admission, having increased to 13,466 on last check on 10/08/2020.  He was started on isosorbide mononitrate and clopidogrel.  Today, [name] reports that he has not had any further chest pain since leaving the hospital earlier this month.  However, he notes that he tires quite easily.  He is trying to walk 10 minutes each morning but usually needs to rest after taking about 200 steps due to fatigue and shortness of breath.  He also notes that his heart rate goes up quite a bit with mild activity.  He has been losing some weight after his hospitalization.  He has stable ankle edema, which has been chronic.  Blood pressures have been low normal at home.  He notes occasional cough.  He denies orthopnea and  PND.  --------------------------------------------------------------------------------------------------  Cardiovascular History & Procedures: Cardiovascular Problems: Coronary artery disease Ischemic cardiomyopathy Heart failure with preserved ejection fraction (HFpEF)   Risk Factors: Known coronary artery disease, hypertension, hyperlipidemia, diabetes mellitus, male gender, obesity, and age > 96   Cath/PCI: LHC (10/04/2020): LMCA with mild diffuse disease.  LAD with moderate diffuse proximal disease and chronic subtotal occlusion of the mid vessel.  Distal LAD fills via left to left and right to left collaterals.  LCx with moderate mid vessel disease.  RCA chronically occluded proximally, filling via left to right collaterals.  SVGs to RPDA, LAD, and OM are chronically occluded.  LIMA to LAD known to be atretic.  LVEDP 22 mmHg. LHC (10/18/12, [place], [place]): LMCA 20-30% ostial/proximal stenosis. LAD with sequential 100% and 95-99% mid stenoses. LCx with 40-50% mid stenosis. RCA with 100% proximal and 80-100% sequential distal lesions. RPDA and rPL1 have 99% and 95% stenoses, respectively. SVG->LAD and SVG->rPDA are patent. LVEF 67% with normal wall motion. LHC (04/05/10, [place], [place]): LMCA with 30% mid stenosis. LAD with 100% midvessel occlution. LCx with 40% mid stenosis. RCA with 100% proximal stenosis. LIMA->LAD atretic, SVG->LCx occluded, and SVG-> RCA with 80% ostial and 70% distal stenoses.   CV Surgery: Redo CABG (04/2010, [place], [place]): SVG->LAD and SVG->rPDA. CABG (2001, [place]): LIMA->LAD, SVG->LCx, and SVG->rPDA   EP Procedures and Devices: None   Non-Invasive Evaluation(s): TTE (09/23/2019): Normal LV size and wall thickness.  LVEF 60-65% with grade 1 diastolic dysfunction.  Normal RV size and function.  No atrial enlargement.  Aortic sclerosis without stenosis.  Normal CVP. Pharmacologic MPI (  11/06/16): Low risk study without ischemia or scar.  LVEF 48% (calculated) but visually appears normal. TTE (10/21/16): Normal LV size with mild LVH. LVEF 65-70% with normal diastolic function. Mild LA enlargement. Normal RV size and function. ABIs (03/08/15): Right 1.25, left 1.26; no significant change with exercise. Exercise myocardial perfusion stress test (10/07/12): Small to moderate in size, mild to moderate in severity, partially reversible defect involving the mid inferior and inferolateral segments. LVEF 65%.  Recent CV Pertinent Labs: Lab Results  Component Value Date   CHOL 140 08/31/2020   HDL 50 08/31/2020   LDLCALC 63 08/31/2020   TRIG 156 (H) 08/31/2020   CHOLHDL 2.8 08/31/2020   CHOLHDL 2.7 11/14/2016   BNP 695.5 (H) 10/09/2020   K 3.6 10/09/2020   MG 1.9 02/25/2019   BUN 14 10/09/2020   BUN 8 08/31/2020   CREATININE 0.75 10/09/2020    Past medical and surgical history were reviewed and updated in EPIC.  Current Meds  Medication Sig   acetaminophen (TYLENOL) 500 MG tablet Take 1,000 mg by mouth every 6 (six) hours as needed for moderate pain or headache.   aspirin EC 81 MG tablet Take 1 tablet (81 mg total) by mouth daily.   carvedilol (COREG) 12.5 MG tablet Take 1 tablet (12.5 mg total) by mouth 2 (two) times daily with a meal.   Cholecalciferol (VITAMIN D-3 PO) Take 800 Units by mouth daily.   clopidogrel (PLAVIX) 75 MG tablet Take 1 tablet (75 mg total) by mouth daily.   Coenzyme Q10 (CO Q-10) 200 MG CAPS Take 200 mg by mouth daily.    cyanocobalamin 500 MCG tablet Take 500 mcg by mouth daily.   diclofenac Sodium (VOLTAREN) 1 % GEL Apply 2 g topically 2 (two) times daily as needed.   ezetimibe (ZETIA) 10 MG tablet TAKE 1 TABLET BY MOUTH EVERY DAY   finasteride (PROSCAR) 5 MG tablet TAKE 1 TABLET BY MOUTH EVERY DAY   furosemide (LASIX) 40 MG tablet Take 1 tablet (40 mg total) by mouth daily.   Glucosamine-Chondroit-Vit C-Mn (GLUCOSAMINE 1500 COMPLEX) CAPS Take 1 capsule by mouth daily.   isosorbide mononitrate  (IMDUR) 30 MG 24 hr tablet Take 0.5 tablets (15 mg total) by mouth at bedtime.   Lancets (ONETOUCH ULTRASOFT) lancets Use to check blood sugars fasting daily   metFORMIN (GLUCOPHAGE) 500 MG tablet 1/2 tablet by mouth daily with food   Multiple Vitamins-Minerals (PRESERVISION AREDS 2 PO) Take 1 capsule by mouth 2 (two) times a day.   nitroGLYCERIN (NITROSTAT) 0.4 MG SL tablet Place 1 tablet (0.4 mg total) under the tongue every 5 (five) minutes as needed.   ranolazine (RANEXA) 1000 MG SR tablet Take 1 tablet by mouth twice daily   rosuvastatin (CRESTOR) 20 MG tablet TAKE 1 TABLET BY MOUTH EVERYDAY AT BEDTIME    Allergies: Other and Tetracyclines & related  Social History   Tobacco Use   Smoking status: Never   Smokeless tobacco: Never  Vaping Use   Vaping Use: Never used  Substance Use Topics   Alcohol use: Not Currently    Alcohol/week: 11.0 standard drinks    Types: 5 Glasses of wine, 6 Cans of beer per week    Comment: 5-7 drinks a week (was 10-15 drinks/week)   Drug use: No    Family History  Problem Relation Age of Onset   Diabetes Mother    Hyperlipidemia Mother    Cancer Father        colon   Alcohol  abuse Maternal Uncle    Diabetes Maternal Uncle    Hyperlipidemia Maternal Uncle    Cancer Paternal Uncle        colon    Review of Systems: A 12-system review of systems was performed and was negative except as noted in the HPI.  --------------------------------------------------------------------------------------------------  Physical Exam: BP 106/68 (BP Location: Left Arm, Patient Position: Sitting, Cuff Size: Large)   Pulse 68   Ht 5\' 7"  (1.702 m)   Wt 180 lb (81.6 kg)   SpO2 96%   BMI 28.19 kg/m   General:  NAD. Neck: No JVD or HJR. Lungs: Faint crackles at the left lung base.  No wheezes. Heart: Regular rate and rhythm without murmurs, rubs, or gallops. Abdomen: Soft, nontender, nondistended. Extremities: 1+ ankle edema bilaterally.  Radial  arteriotomy site well-healed.  EKG: Normal sinus rhythm with low voltage, as well as anterolateral and inferior infarcts.  Compared with prior tracing on 10/05/2020, anterolateral and inferior T wave inversions are new.  Lab Results  Component Value Date   WBC 17.1 (H) 10/08/2020   HGB 12.3 (L) 10/08/2020   HCT 35.6 (L) 10/08/2020   MCV 90.1 10/08/2020   PLT 202 10/08/2020    Lab Results  Component Value Date   NA 131 (L) 10/09/2020   K 3.6 10/09/2020   CL 97 (L) 10/09/2020   CO2 24 10/09/2020   BUN 14 10/09/2020   CREATININE 0.75 10/09/2020   GLUCOSE 122 (H) 10/09/2020   ALT 19 08/31/2020    Lab Results  Component Value Date   CHOL 140 08/31/2020   HDL 50 08/31/2020   LDLCALC 63 08/31/2020   TRIG 156 (H) 08/31/2020   CHOLHDL 2.8 08/31/2020    --------------------------------------------------------------------------------------------------  ASSESSMENT AND PLAN: NSTEMI: [name] has not had any further angina since his hospitalization earlier this month, at which time he was noted to have occlusion of all bypass grafts in the setting of severe native CAD.  Culprit for his NSTEMI was thought to be recent occlusion of SVG to RPDA.  We will plan to continue 12 months of dual antiplatelet therapy as well as current antianginal regimen of isosorbide mononitrate, carvedilol, and ranolazine.  If he were to have recurrent angina refractory to maximum tolerated doses of antianginal therapy, we would need to consider referral to our CTO team to determine if there are any interventional targets.  Continue aggressive secondary prevention with ezetimibe and rosuvastatin.  Acute on chronic HFpEF: LVEF previously normal though it was not assessed during his recent hospitalization.  [name] is volume overloaded on exam today with NYHA class III symptoms.  We will increase furosemide to 40 mg twice daily and check a BMP and BNP today.  We will also arrange for echocardiogram at Mr.  Jacob Harrison's earliest convenience to reassess his LVEF.  Hyperlipidemia: LDL at goal on last check in May.  Triglycerides minimally elevated.  Continue current doses of rosuvastatin and ezetimibe.  Follow-up: Return to clinic in 2 to 3 weeks.  Nelva Bush, MD 10/25/2020 11:32 AM

## 2020-10-25 NOTE — Patient Instructions (Signed)
Medication Instructions:   Your physician has recommended you make the following change in your medication:   INCREASE Furosemide 40mg  TWICE daily   *If you need a refill on your cardiac medications before your next appointment, please call your pharmacy*   Lab Work:  Today: BMET, BNP  If you have labs (blood work) drawn today and your tests are completely normal, you will receive your results only by: [place] (if you have MyChart) OR A paper copy in the mail If you have any lab test that is abnormal or we need to change your treatment, we will call you to review the results.   Testing/Procedures:  Your physician has requested that you have an echocardiogram. Echocardiography is a painless test that uses sound waves to create images of your heart. It provides your doctor with information about the size and shape of your heart and how well your heart's chambers and valves are working. This procedure takes approximately one hour. There are no restrictions for this procedure.   Follow-Up: At [facility], you and your health needs are our priority.  As part of our continuing mission to provide you with exceptional heart care, we have created designated Provider Care Teams.  These Care Teams include your primary Cardiologist (physician) and Advanced Practice Providers (APPs -  Physician Assistants and Nurse Practitioners) who all work together to provide you with the care you need, when you need it.  We recommend signing up for the patient portal called "MyChart".  Sign up information is provided on this After Visit Summary.  MyChart is used to connect with patients for Virtual Visits (Telemedicine).  Patients are able to view lab/test results, encounter notes, upcoming appointments, etc.  Non-urgent messages can be sent to your provider as well.   To learn more about what you can do with MyChart, go to [email or web address].    Your next appointment:   2 - 3 week(s)  The  format for your next appointment:   In Person  Provider:   You may see Nelva Bush, MD or one of the following Advanced Practice Providers on your designated Care Team:   Murray Hodgkins, NP Christell Faith, PA-C Marrianne Mood, PA-C Cadence Kathlen Mody, [place]   Other Instructions  You should hear back regarding your cardiac rehab referral. You may also call cardiac rehab at [phone].

## 2020-10-26 ENCOUNTER — Encounter: Payer: Self-pay | Admitting: Internal Medicine

## 2020-10-26 LAB — BASIC METABOLIC PANEL
BUN/Creatinine Ratio: 17 (ref 10–24)
BUN: 12 mg/dL (ref 8–27)
CO2: 22 mmol/L (ref 20–29)
Calcium: 9.4 mg/dL (ref 8.6–10.2)
Chloride: 101 mmol/L (ref 96–106)
Creatinine, Ser: 0.71 mg/dL — ABNORMAL LOW (ref 0.76–1.27)
Glucose: 115 mg/dL — ABNORMAL HIGH (ref 65–99)
Potassium: 4.6 mmol/L (ref 3.5–5.2)
Sodium: 140 mmol/L (ref 134–144)
eGFR: 94 mL/min/{1.73_m2} (ref >59)

## 2020-10-26 LAB — BRAIN NATRIURETIC PEPTIDE: BNP: 599.9 pg/mL — ABNORMAL HIGH (ref 0.0–100.0)

## 2020-10-29 ENCOUNTER — Telehealth: Payer: Self-pay | Admitting: *Deleted

## 2020-10-29 NOTE — Telephone Encounter (Signed)
Attempted to call pt to discuss results and provider's recc.  No answer at this time. Lmtcb.

## 2020-10-29 NOTE — Telephone Encounter (Signed)
The patient has been notified of the result and verbalized understanding.  All questions (if any) were answered.     

## 2020-10-29 NOTE — Telephone Encounter (Signed)
-----   Message from Nelva Bush, MD sent at 10/29/2020  7:53 AM EDT ----- Please let Jacob Harrison know that his kidney function and electrolytes are stable.  His BNP is elevated but a little lower compared with 7/5.  I recommend that he continue his current medications, as discussed at our office visit last week.

## 2020-11-02 ENCOUNTER — Encounter: Payer: Self-pay | Admitting: *Deleted

## 2020-11-02 ENCOUNTER — Encounter: Payer: Medicare Other | Attending: Internal Medicine | Admitting: *Deleted

## 2020-11-02 ENCOUNTER — Other Ambulatory Visit: Payer: Self-pay

## 2020-11-02 DIAGNOSIS — I214 Non-ST elevation (NSTEMI) myocardial infarction: Secondary | ICD-10-CM

## 2020-11-02 NOTE — Progress Notes (Signed)
Virtual orientation call completed today. he has an appointment on Date: 11/08/2020  for EP eval and gym Orientation.  Documentation of diagnosis can be found in [facility] Date: 10/04/2020 .

## 2020-11-08 ENCOUNTER — Other Ambulatory Visit: Payer: Self-pay

## 2020-11-08 ENCOUNTER — Encounter: Payer: Medicare Other | Attending: Internal Medicine

## 2020-11-08 VITALS — Ht 66.1 in | Wt 176.2 lb

## 2020-11-08 DIAGNOSIS — I252 Old myocardial infarction: Secondary | ICD-10-CM | POA: Diagnosis not present

## 2020-11-08 DIAGNOSIS — E119 Type 2 diabetes mellitus without complications: Secondary | ICD-10-CM | POA: Diagnosis not present

## 2020-11-08 DIAGNOSIS — I214 Non-ST elevation (NSTEMI) myocardial infarction: Secondary | ICD-10-CM

## 2020-11-08 DIAGNOSIS — H43811 Vitreous degeneration, right eye: Secondary | ICD-10-CM | POA: Diagnosis not present

## 2020-11-08 NOTE — Patient Instructions (Signed)
Patient Instructions  Patient Details  Name: Jacob Harrison MRN: [id number] Date of Birth: Sep 16, 1940 Referring Provider:  Nelva Bush, MD  Below are your personal goals for exercise, nutrition, and risk factors. Our goal is to help you stay on track towards obtaining and maintaining these goals. We will be discussing your progress on these goals with you throughout the program.  Initial Exercise Prescription:  Initial Exercise Prescription - 11/08/20 1100       Date of Initial Exercise RX and Referring Provider   Date 11/08/20    Referring Provider End, Harrell Gave MD      Treadmill   MPH 1.3    Grade 0    Minutes 15    METs 2      Recumbant Bike   Level 1    RPM 60    Watts 10    Minutes 15    METs 1.3      NuStep   Level 1    SPM 80    Minutes 15    METs 1.3      Biostep-RELP   Level 1    SPM 50    Minutes 15    METs 1.3      Prescription Details   Frequency (times per week) 3    Duration Progress to 30 minutes of continuous aerobic without signs/symptoms of physical distress      Intensity   THRR 40-80% of Max Heartrate 92-126    Ratings of Perceived Exertion 11-13    Perceived Dyspnea 0-4      Progression   Progression Continue to progress workloads to maintain intensity without signs/symptoms of physical distress.      Resistance Training   Training Prescription Yes    Weight 3 lb    Reps 10-15             Exercise Goals: Frequency: Be able to perform aerobic exercise two to three times per week in program working toward 2-5 days per week of home exercise.  Intensity: Work with a perceived exertion of 11 (fairly light) - 15 (hard) while following your exercise prescription.  We will make changes to your prescription with you as you progress through the program.   Duration: Be able to do 30 to 45 minutes of continuous aerobic exercise in addition to a 5 minute warm-up and a 5 minute cool-down routine.   Nutrition Goals: Your  personal nutrition goals will be established when you do your nutrition analysis with the dietician.  The following are general nutrition guidelines to follow: Cholesterol < '200mg'$ /day Sodium < '1500mg'$ /day Fiber: Men over 50 yrs - 30 grams per day  Personal Goals:  Personal Goals and Risk Factors at Admission - 11/08/20 1127       Core Components/Risk Factors/Patient Goals on Admission    Weight Management Yes;Weight Loss    Intervention Weight Management: Develop a combined nutrition and exercise program designed to reach desired caloric intake, while maintaining appropriate intake of nutrient and fiber, sodium and fats, and appropriate energy expenditure required for the weight goal.;Weight Management: Provide education and appropriate resources to help participant work on and attain dietary goals.    Admit Weight 176 lb (79.8 kg)    Goal Weight: Short Term 171 lb (77.6 kg)    Goal Weight: Long Term 160 lb (72.6 kg)   Goal per patient   Expected Outcomes Short Term: Continue to assess and modify interventions until short term weight is achieved;Weight Loss: Understanding of general  recommendations for a balanced deficit meal plan, which promotes 1-2 lb weight loss per week and includes a negative energy balance of [phone] kcal/d;Understanding of distribution of calorie intake throughout the day with the consumption of 4-5 meals/snacks;Understanding recommendations for meals to include 15-35% energy as protein, 25-35% energy from fat, 35-60% energy from carbohydrates, less than '200mg'$  of dietary cholesterol, 20-35 gm of total fiber daily    Diabetes Yes    Intervention Provide education about signs/symptoms and action to take for hypo/hyperglycemia.;Provide education about proper nutrition, including hydration, and aerobic/resistive exercise prescription along with prescribed medications to achieve blood glucose in normal ranges: Fasting glucose 65-99 mg/dL    Expected Outcomes Short Term:  Participant verbalizes understanding of the signs/symptoms and immediate care of hyper/hypoglycemia, proper foot care and importance of medication, aerobic/resistive exercise and nutrition plan for blood glucose control.;Long Term: Attainment of HbA1C < 7%.    Hypertension Yes    Intervention Provide education on lifestyle modifcations including regular physical activity/exercise, weight management, moderate sodium restriction and increased consumption of fresh fruit, vegetables, and low fat dairy, alcohol moderation, and smoking cessation.;Monitor prescription use compliance.    Expected Outcomes Short Term: Continued assessment and intervention until BP is < 140/97m HG in hypertensive participants. < 130/816mHG in hypertensive participants with diabetes, heart failure or chronic kidney disease.;Long Term: Maintenance of blood pressure at goal levels.    Lipids Yes    Intervention Provide education and support for participant on nutrition & aerobic/resistive exercise along with prescribed medications to achieve LDL '70mg'$ , HDL >'40mg'$ .    Expected Outcomes Short Term: Participant states understanding of desired cholesterol values and is compliant with medications prescribed. Participant is following exercise prescription and nutrition guidelines.;Long Term: Cholesterol controlled with medications as prescribed, with individualized exercise RX and with personalized nutrition plan. Value goals: LDL < '70mg'$ , HDL > 40 mg.             Tobacco Use Initial Evaluation: Social History   Tobacco Use  Smoking Status Never  Smokeless Tobacco Never    Exercise Goals and Review:  Exercise Goals     Row Name 11/08/20 1127             Exercise Goals   Increase Physical Activity Yes       Intervention Provide advice, education, support and counseling about physical activity/exercise needs.;Develop an individualized exercise prescription for aerobic and resistive training based on initial evaluation  findings, risk stratification, comorbidities and participant's personal goals.       Expected Outcomes Short Term: Attend rehab on a regular basis to increase amount of physical activity.;Long Term: Add in home exercise to make exercise part of routine and to increase amount of physical activity.;Long Term: Exercising regularly at least 3-5 days a week.       Increase Strength and Stamina Yes       Intervention Provide advice, education, support and counseling about physical activity/exercise needs.;Develop an individualized exercise prescription for aerobic and resistive training based on initial evaluation findings, risk stratification, comorbidities and participant's personal goals.       Expected Outcomes Short Term: Increase workloads from initial exercise prescription for resistance, speed, and METs.;Short Term: Perform resistance training exercises routinely during rehab and add in resistance training at home;Long Term: Improve cardiorespiratory fitness, muscular endurance and strength as measured by increased METs and functional capacity (6MWT)       Able to understand and use rate of perceived exertion (RPE) scale Yes  Intervention Provide education and explanation on how to use RPE scale       Expected Outcomes Short Term: Able to use RPE daily in rehab to express subjective intensity level;Long Term:  Able to use RPE to guide intensity level when exercising independently       Able to understand and use Dyspnea scale Yes       Intervention Provide education and explanation on how to use Dyspnea scale       Expected Outcomes Short Term: Able to use Dyspnea scale daily in rehab to express subjective sense of shortness of breath during exertion;Long Term: Able to use Dyspnea scale to guide intensity level when exercising independently       Knowledge and understanding of Target Heart Rate Range (THRR) Yes       Intervention Provide education and explanation of THRR including how the numbers  were predicted and where they are located for reference       Expected Outcomes Short Term: Able to state/look up THRR;Long Term: Able to use THRR to govern intensity when exercising independently;Short Term: Able to use daily as guideline for intensity in rehab       Able to check pulse independently Yes       Intervention Provide education and demonstration on how to check pulse in carotid and radial arteries.;Review the importance of being able to check your own pulse for safety during independent exercise       Expected Outcomes Short Term: Able to explain why pulse checking is important during independent exercise;Long Term: Able to check pulse independently and accurately       Understanding of Exercise Prescription Yes       Intervention Provide education, explanation, and written materials on patient's individual exercise prescription       Expected Outcomes Short Term: Able to explain program exercise prescription;Long Term: Able to explain home exercise prescription to exercise independently                Copy of goals given to participant.

## 2020-11-08 NOTE — Progress Notes (Signed)
Cardiac Individual Treatment Plan  Patient Details  Name: Jacob Harrison MRN: [id number] Date of Birth: 04-10-1940 Referring Provider:   Flowsheet Row Cardiac Rehab from 11/08/2020 in [facility] Cardiac and Pulmonary Rehab  Referring Provider End, Harrell Gave MD       Initial Encounter Date:  Flowsheet Row Cardiac Rehab from 11/08/2020 in [facility] Cardiac and Pulmonary Rehab  Date 11/08/20       Visit Diagnosis: NSTEMI (non-ST elevation myocardial infarction) [facility])  Patient's Home Medications on Admission:  Current Outpatient Medications:    acetaminophen (TYLENOL) 500 MG tablet, Take 1,000 mg by mouth every 6 (six) hours as needed for moderate pain or headache., Disp: , Rfl:    aspirin EC 81 MG tablet, Take 1 tablet (81 mg total) by mouth daily., Disp: , Rfl:    carvedilol (COREG) 12.5 MG tablet, Take 1 tablet (12.5 mg total) by mouth 2 (two) times daily with a meal., Disp: 60 tablet, Rfl: 2   Cholecalciferol (VITAMIN D-3 PO), Take 800 Units by mouth daily., Disp: , Rfl:    clopidogrel (PLAVIX) 75 MG tablet, Take 1 tablet (75 mg total) by mouth daily., Disp: 90 tablet, Rfl: 1   Coenzyme Q10 (CO Q-10) 200 MG CAPS, Take 200 mg by mouth daily. , Disp: , Rfl:    cyanocobalamin 500 MCG tablet, Take 500 mcg by mouth daily., Disp: , Rfl:    diclofenac Sodium (VOLTAREN) 1 % GEL, Apply 2 g topically 2 (two) times daily as needed., Disp: 120 g, Rfl: 0   ezetimibe (ZETIA) 10 MG tablet, TAKE 1 TABLET BY MOUTH EVERY DAY, Disp: 90 tablet, Rfl: 0   finasteride (PROSCAR) 5 MG tablet, TAKE 1 TABLET BY MOUTH EVERY DAY, Disp: 90 tablet, Rfl: 0   furosemide (LASIX) 40 MG tablet, Take 1 tablet (40 mg total) by mouth 2 (two) times daily., Disp: 60 tablet, Rfl: 1   Glucosamine-Chondroit-Vit C-Mn (GLUCOSAMINE 1500 COMPLEX) CAPS, Take 1 capsule by mouth daily., Disp: , Rfl:    isosorbide mononitrate (IMDUR) 30 MG 24 hr tablet, Take 0.5 tablets (15 mg total) by mouth at bedtime., Disp: 15 tablet, Rfl: 2    Lancets (ONETOUCH ULTRASOFT) lancets, Use to check blood sugars fasting daily, Disp: 100 each, Rfl: 12   metFORMIN (GLUCOPHAGE) 500 MG tablet, 1/2 tablet by mouth daily with food, Disp: 90 tablet, Rfl: 2   Multiple Vitamins-Minerals (PRESERVISION AREDS 2 PO), Take 1 capsule by mouth 2 (two) times a day., Disp: , Rfl:    nitroGLYCERIN (NITROSTAT) 0.4 MG SL tablet, Place 1 tablet (0.4 mg total) under the tongue every 5 (five) minutes as needed., Disp: 25 tablet, Rfl: 3   ranolazine (RANEXA) 1000 MG SR tablet, Take 1 tablet by mouth twice daily, Disp: 180 tablet, Rfl: 3   rosuvastatin (CRESTOR) 20 MG tablet, TAKE 1 TABLET BY MOUTH EVERYDAY AT BEDTIME, Disp: 90 tablet, Rfl: 0  Past Medical History: Past Medical History:  Diagnosis Date   (HFpEF) heart failure with preserved ejection fraction ([place])    a. 10/2016 Echo: EF 65-70%, mild LVH, mildly dil LA. RV fxn nl.   Arthritis    BPH (benign prostatic hyperplasia)    Coronary artery disease    a. 2001 CABG x 3 [facility]): LIMA->LAD, VG->LCX, VG->RPDA; b. 04/05/2010 Cath ([place]): LM 67m LAD 1066mLCX 4067mCA 100p, LIMA->LAD atretic, VG->LCX 100, VG->RCA 80ost, 70d; c. 04/2010 Redo CABG x 2 ([place]): VG->LAD, VG->RPDA; d. 10/2012 Cath ([facility] 20-30, LAD 100/95-29m73mX 40-55m,76m 100p, 80-100d, RPDA 99,  RPL1 95, VG->LAD ok, VG->RPDA ok, EF 67%; e. 11/2016 MV: EF 48%, No ischemia.   Diabetes mellitus without complication ([place])    Type II   Hyperlipidemia    Hypertension    Scoliosis    Spinal stenosis     Tobacco Use: Social History   Tobacco Use  Smoking Status Never  Smokeless Tobacco Never    Labs: Recent Review Flowsheet Data     Labs for ITP Cardiac and Pulmonary Rehab Latest Ref Rng & Units 02/22/2019 05/17/2019 08/15/2019 04/16/2020 08/31/2020   Cholestrol 100 - 199 mg/dL - - 153 160 140   LDLCALC 0 - 99 mg/dL - - 71 69 63   HDL >39 mg/dL - - 67 69 50   Trlycerides 0 - 149 mg/dL - - 76 129 156(H)   Hemoglobin A1c 4.8 - 5.6 %  5.8(A) 5.8(A) 5.5 6.1(H) 6.1(H)        Exercise Target Goals: Exercise Program Goal: Individual exercise prescription set using results from initial 6 min walk test and THRR while considering  patient's activity barriers and safety.   Exercise Prescription Goal: Initial exercise prescription builds to 30-45 minutes a day of aerobic activity, 2-3 days per week.  Home exercise guidelines will be given to patient during program as part of exercise prescription that the participant will acknowledge.   Education: Aerobic Exercise: - Group verbal and visual presentation on the components of exercise prescription. Introduces F.I.T.T principle from ACSM for exercise prescriptions.  Reviews F.I.T.T. principles of aerobic exercise including progression. Written material given at graduation.   Education: Resistance Exercise: - Group verbal and visual presentation on the components of exercise prescription. Introduces F.I.T.T principle from ACSM for exercise prescriptions  Reviews F.I.T.T. principles of resistance exercise including progression. Written material given at graduation.    Education: Exercise & Equipment Safety: - Individual verbal instruction and demonstration of equipment use and safety with use of the equipment. Flowsheet Row Cardiac Rehab from 11/08/2020 in [facility] Cardiac and Pulmonary Rehab  Education need identified 11/08/20  Date 11/08/20  Educator [place]  Instruction Review Code 1- Verbalizes Understanding       Education: Exercise Physiology & General Exercise Guidelines: - Group verbal and written instruction with models to review the exercise physiology of the cardiovascular system and associated critical values. Provides general exercise guidelines with specific guidelines to those with heart or lung disease.    Education: Flexibility, Balance, Mind/Body Relaxation: - Group verbal and visual presentation with interactive activity on the components of exercise prescription.  Introduces F.I.T.T principle from ACSM for exercise prescriptions. Reviews F.I.T.T. principles of flexibility and balance exercise training including progression. Also discusses the mind body connection.  Reviews various relaxation techniques to help reduce and manage stress (i.e. Deep breathing, progressive muscle relaxation, and visualization). Balance handout provided to take home. Written material given at graduation.   Activity Barriers & Risk Stratification:  Activity Barriers & Cardiac Risk Stratification - 11/08/20 1113       Activity Barriers & Cardiac Risk Stratification   Activity Barriers Deconditioning;Muscular Weakness;Other (comment)    Comments Diabetic neuropathy, torn left rotator cuff    Cardiac Risk Stratification High             6 Minute Walk:  6 Minute Walk     Row Name 11/08/20 1119         6 Minute Walk   Phase Initial     Distance 790 feet     Walk Time 6 minutes     #  of Rest Breaks 0     MPH 1.49     METS 1.29     RPE 13     Perceived Dyspnea  2     VO2 Peak 4.53     Symptoms Yes (comment)     Comments Slight SOB- resolved with rest     Resting HR 59 bpm     Resting BP 96/62     Resting Oxygen Saturation  97 %     Exercise Oxygen Saturation  during 6 min walk 97 %     Max Ex. HR 85 bpm     Max Ex. BP 112/64     2 Minute Post BP 108/62              Oxygen Initial Assessment:   Oxygen Re-Evaluation:   Oxygen Discharge (Final Oxygen Re-Evaluation):   Initial Exercise Prescription:  Initial Exercise Prescription - 11/08/20 1100       Date of Initial Exercise RX and Referring Provider   Date 11/08/20    Referring Provider End, Harrell Gave MD      Treadmill   MPH 1.3    Grade 0    Minutes 15    METs 2      Recumbant Bike   Level 1    RPM 60    Watts 10    Minutes 15    METs 1.3      NuStep   Level 1    SPM 80    Minutes 15    METs 1.3      Biostep-RELP   Level 1    SPM 50    Minutes 15    METs 1.3       Prescription Details   Frequency (times per week) 3    Duration Progress to 30 minutes of continuous aerobic without signs/symptoms of physical distress      Intensity   THRR 40-80% of Max Heartrate 92-126    Ratings of Perceived Exertion 11-13    Perceived Dyspnea 0-4      Progression   Progression Continue to progress workloads to maintain intensity without signs/symptoms of physical distress.      Resistance Training   Training Prescription Yes    Weight 3 lb    Reps 10-15             Perform Capillary Blood Glucose checks as needed.  Exercise Prescription Changes:   Exercise Prescription Changes     Row Name 11/08/20 1100             Response to Exercise   Blood Pressure (Admit) 96/92       Blood Pressure (Exercise) 112/64       Blood Pressure (Exit) 108/62       Heart Rate (Admit) 59 bpm       Heart Rate (Exercise) 85 bpm       Heart Rate (Exit) 70 bpm       Oxygen Saturation (Admit) 97 %       Oxygen Saturation (Exercise) 97 %       Oxygen Saturation (Exit) 97 %       Rating of Perceived Exertion (Exercise) 13       Perceived Dyspnea (Exercise) 2       Symptoms Slight SOB       Comments walk test results                Exercise Comments:   Exercise Goals and Review:  Exercise Goals     Row Name 11/08/20 1127             Exercise Goals   Increase Physical Activity Yes       Intervention Provide advice, education, support and counseling about physical activity/exercise needs.;Develop an individualized exercise prescription for aerobic and resistive training based on initial evaluation findings, risk stratification, comorbidities and participant's personal goals.       Expected Outcomes Short Term: Attend rehab on a regular basis to increase amount of physical activity.;Long Term: Add in home exercise to make exercise part of routine and to increase amount of physical activity.;Long Term: Exercising regularly at least 3-5 days a week.        Increase Strength and Stamina Yes       Intervention Provide advice, education, support and counseling about physical activity/exercise needs.;Develop an individualized exercise prescription for aerobic and resistive training based on initial evaluation findings, risk stratification, comorbidities and participant's personal goals.       Expected Outcomes Short Term: Increase workloads from initial exercise prescription for resistance, speed, and METs.;Short Term: Perform resistance training exercises routinely during rehab and add in resistance training at home;Long Term: Improve cardiorespiratory fitness, muscular endurance and strength as measured by increased METs and functional capacity (6MWT)       Able to understand and use rate of perceived exertion (RPE) scale Yes       Intervention Provide education and explanation on how to use RPE scale       Expected Outcomes Short Term: Able to use RPE daily in rehab to express subjective intensity level;Long Term:  Able to use RPE to guide intensity level when exercising independently       Able to understand and use Dyspnea scale Yes       Intervention Provide education and explanation on how to use Dyspnea scale       Expected Outcomes Short Term: Able to use Dyspnea scale daily in rehab to express subjective sense of shortness of breath during exertion;Long Term: Able to use Dyspnea scale to guide intensity level when exercising independently       Knowledge and understanding of Target Heart Rate Range (THRR) Yes       Intervention Provide education and explanation of THRR including how the numbers were predicted and where they are located for reference       Expected Outcomes Short Term: Able to state/look up THRR;Long Term: Able to use THRR to govern intensity when exercising independently;Short Term: Able to use daily as guideline for intensity in rehab       Able to check pulse independently Yes       Intervention Provide education and demonstration  on how to check pulse in carotid and radial arteries.;Review the importance of being able to check your own pulse for safety during independent exercise       Expected Outcomes Short Term: Able to explain why pulse checking is important during independent exercise;Long Term: Able to check pulse independently and accurately       Understanding of Exercise Prescription Yes       Intervention Provide education, explanation, and written materials on patient's individual exercise prescription       Expected Outcomes Short Term: Able to explain program exercise prescription;Long Term: Able to explain home exercise prescription to exercise independently                Exercise Goals Re-Evaluation :   Discharge Exercise Prescription (Final Exercise  Prescription Changes):  Exercise Prescription Changes - 11/08/20 1100       Response to Exercise   Blood Pressure (Admit) 96/92    Blood Pressure (Exercise) 112/64    Blood Pressure (Exit) 108/62    Heart Rate (Admit) 59 bpm    Heart Rate (Exercise) 85 bpm    Heart Rate (Exit) 70 bpm    Oxygen Saturation (Admit) 97 %    Oxygen Saturation (Exercise) 97 %    Oxygen Saturation (Exit) 97 %    Rating of Perceived Exertion (Exercise) 13    Perceived Dyspnea (Exercise) 2    Symptoms Slight SOB    Comments walk test results             Nutrition:  Target Goals: Understanding of nutrition guidelines, daily intake of sodium '1500mg'$ , cholesterol '200mg'$ , calories 30% from fat and 7% or less from saturated fats, daily to have 5 or more servings of fruits and vegetables.  Education: All About Nutrition: -Group instruction provided by verbal, written material, interactive activities, discussions, models, and posters to present general guidelines for heart healthy nutrition including fat, fiber, MyPlate, the role of sodium in heart healthy nutrition, utilization of the nutrition label, and utilization of this knowledge for meal planning. Follow up  email sent as well. Written material given at graduation.   Biometrics:  Pre Biometrics - 11/08/20 1113       Pre Biometrics   Height 5' 6.1" (1.679 m)    Weight 176 lb 3.2 oz (79.9 kg)    BMI (Calculated) 28.35    Single Leg Stand 11.6 seconds              Nutrition Therapy Plan and Nutrition Goals:   Nutrition Assessments:  MEDIFICTS Score Key: ?70 Need to make dietary changes  40-70 Heart Healthy Diet ? 40 Therapeutic Level Cholesterol Diet  Flowsheet Row Cardiac Rehab from 11/08/2020 in [facility] Cardiac and Pulmonary Rehab  Picture Your Plate Total Score on Admission 77      Picture Your Plate Scores: [id number] Unhealthy dietary pattern with much room for improvement. 41-50 Dietary pattern unlikely to meet recommendations for good health and room for improvement. 51-60 More healthful dietary pattern, with some room for improvement.  >60 Healthy dietary pattern, although there may be some specific behaviors that could be improved.    Nutrition Goals Re-Evaluation:   Nutrition Goals Discharge (Final Nutrition Goals Re-Evaluation):   Psychosocial: Target Goals: Acknowledge presence or absence of significant depression and/or stress, maximize coping skills, provide positive support system. Participant is able to verbalize types and ability to use techniques and skills needed for reducing stress and depression.   Education: Stress, Anxiety, and Depression - Group verbal and visual presentation to define topics covered.  Reviews how body is impacted by stress, anxiety, and depression.  Also discusses healthy ways to reduce stress and to treat/manage anxiety and depression.  Written material given at graduation.   Education: Sleep Hygiene -Provides group verbal and written instruction about how sleep can affect your health.  Define sleep hygiene, discuss sleep cycles and impact of sleep habits. Review good sleep hygiene tips.    Initial Review & Psychosocial Screening:   Initial Psych Review & Screening - 11/02/20 1013       Initial Review   Current issues with None Identified      Family Dynamics   Good Support System? Yes   Wife of 10 years, Children live in [place].     Barriers  Psychosocial barriers to participate in program There are no identifiable barriers or psychosocial needs.      Screening Interventions   Interventions Encouraged to exercise    Expected Outcomes Short Term goal: Utilizing psychosocial counselor, staff and physician to assist with identification of specific Stressors or current issues interfering with healing process. Setting desired goal for each stressor or current issue identified.;Long Term Goal: Stressors or current issues are controlled or eliminated.;Short Term goal: Identification and review with participant of any Quality of Life or Depression concerns found by scoring the questionnaire.;Long Term goal: The participant improves quality of Life and PHQ9 Scores as seen by post scores and/or verbalization of changes             Quality of Life Scores:   Quality of Life - 11/08/20 1110       Quality of Life   Select Quality of Life      Quality of Life Scores   Health/Function Pre 16.27 %    Socioeconomic Pre 25 %    Psych/Spiritual Pre 25.57 %    Family Pre 28.5 %    GLOBAL Pre 21.87 %            Scores of 19 and below usually indicate a poorer quality of life in these areas.  A difference of  2-3 points is a clinically meaningful difference.  A difference of 2-3 points in the total score of the Quality of Life Index has been associated with significant improvement in overall quality of life, self-image, physical symptoms, and general health in studies assessing change in quality of life.  PHQ-9: Recent Review Flowsheet Data     Depression screen [facility] 2/9 11/08/2020 09/04/2020 07/03/2020 04/18/2020 12/15/2019   Decreased Interest 1 0 0 0 0   Down, Depressed, Hopeless 0 0 0 0 0   PHQ - 2 Score 1 0 0 0 0    Altered sleeping '1 1 1 1 2   '$ Tired, decreased energy '2 1 1 '$ 0 0   Change in appetite 1 0 0 0 0   Feeling bad or failure about yourself  0 0 0 0 0   Trouble concentrating 1 0 0 0 0   Moving slowly or fidgety/restless 1 1 0 0 0   Suicidal thoughts 0 0 0 0 0   PHQ-9 Score '7 3 2 1 2   '$ Difficult doing work/chores Somewhat difficult Somewhat difficult - Not difficult at all Not difficult at all      Interpretation of Total Score  Total Score Depression Severity:  1-4 = Minimal depression, 5-9 = Mild depression, 10-14 = Moderate depression, 15-19 = Moderately severe depression, 20-27 = Severe depression   Psychosocial Evaluation and Intervention:  Psychosocial Evaluation - 11/02/20 1020       Psychosocial Evaluation & Interventions   Interventions Encouraged to exercise with the program and follow exercise prescription    Comments Denice Paradise has no barriers to attending the program. He was in the hospital in bed for 6 days and feels his strength and stamina is decreased. He hopes to see improvement in both by attending the program. He had significant weight loss in the hospital,and has continued with some weight loss;he has a goal of 165 lb. He lives with his wife and has children within 15- 20 minutes. THey are all part of his support team. He should do well with the program.    Expected Outcomes STG: Rich attends all scheduled sessions He will expoereince an  increase in  strength and stamina LTG: Rich will comtinue withhis exercise progression, and continue with his weight loss to his goal of 165 lb.    Continue Psychosocial Services  Follow up required by staff             Psychosocial Re-Evaluation:   Psychosocial Discharge (Final Psychosocial Re-Evaluation):   Vocational Rehabilitation: Provide vocational rehab assistance to qualifying candidates.   Vocational Rehab Evaluation & Intervention:   Education: Education Goals: Education classes will be provided on a variety of topics  geared toward better understanding of heart health and risk factor modification. Participant will state understanding/return demonstration of topics presented as noted by education test scores.  Learning Barriers/Preferences:   General Cardiac Education Topics:  AED/CPR: - Group verbal and written instruction with the use of models to demonstrate the basic use of the AED with the basic ABC's of resuscitation.   Anatomy and Cardiac Procedures: - Group verbal and visual presentation and models provide information about basic cardiac anatomy and function. Reviews the testing methods done to diagnose heart disease and the outcomes of the test results. Describes the treatment choices: Medical Management, Angioplasty, or Coronary Bypass Surgery for treating various heart conditions including Myocardial Infarction, Angina, Valve Disease, and Cardiac Arrhythmias.  Written material given at graduation.   Medication Safety: - Group verbal and visual instruction to review commonly prescribed medications for heart and lung disease. Reviews the medication, class of the drug, and side effects. Includes the steps to properly store meds and maintain the prescription regimen.  Written material given at graduation.   Intimacy: - Group verbal instruction through game format to discuss how heart and lung disease can affect sexual intimacy. Written material given at graduation..   Know Your Numbers and Heart Failure: - Group verbal and visual instruction to discuss disease risk factors for cardiac and pulmonary disease and treatment options.  Reviews associated critical values for Overweight/Obesity, Hypertension, Cholesterol, and Diabetes.  Discusses basics of heart failure: signs/symptoms and treatments.  Introduces Heart Failure Zone chart for action plan for heart failure.  Written material given at graduation.   Infection Prevention: - Provides verbal and written material to individual with discussion of  infection control including proper hand washing and proper equipment cleaning during exercise session. Flowsheet Row Cardiac Rehab from 11/08/2020 in [facility] Cardiac and Pulmonary Rehab  Education need identified 11/08/20  Date 11/08/20  Educator [place]  Instruction Review Code 1- Verbalizes Understanding       Falls Prevention: - Provides verbal and written material to individual with discussion of falls prevention and safety. Flowsheet Row Cardiac Rehab from 11/08/2020 in [facility] Cardiac and Pulmonary Rehab  Education need identified 11/08/20  Date 11/08/20  Educator [place]  Instruction Review Code 1- Verbalizes Understanding       Other: -Provides group and verbal instruction on various topics (see comments)   Knowledge Questionnaire Score:  Knowledge Questionnaire Score - 11/08/20 1112       Knowledge Questionnaire Score   Pre Score 25/26: Angina             Core Components/Risk Factors/Patient Goals at Admission:  Personal Goals and Risk Factors at Admission - 11/08/20 1127       Core Components/Risk Factors/Patient Goals on Admission    Weight Management Yes;Weight Loss    Intervention Weight Management: Develop a combined nutrition and exercise program designed to reach desired caloric intake, while maintaining appropriate intake of nutrient and fiber, sodium and fats, and appropriate energy expenditure required  for the weight goal.;Weight Management: Provide education and appropriate resources to help participant work on and attain dietary goals.    Admit Weight 176 lb (79.8 kg)    Goal Weight: Short Term 171 lb (77.6 kg)    Goal Weight: Long Term 160 lb (72.6 kg)   Goal per patient   Expected Outcomes Short Term: Continue to assess and modify interventions until short term weight is achieved;Weight Loss: Understanding of general recommendations for a balanced deficit meal plan, which promotes 1-2 lb weight loss per week and includes a negative energy balance of [phone]  kcal/d;Understanding of distribution of calorie intake throughout the day with the consumption of 4-5 meals/snacks;Understanding recommendations for meals to include 15-35% energy as protein, 25-35% energy from fat, 35-60% energy from carbohydrates, less than '200mg'$  of dietary cholesterol, 20-35 gm of total fiber daily    Diabetes Yes    Intervention Provide education about signs/symptoms and action to take for hypo/hyperglycemia.;Provide education about proper nutrition, including hydration, and aerobic/resistive exercise prescription along with prescribed medications to achieve blood glucose in normal ranges: Fasting glucose 65-99 mg/dL    Expected Outcomes Short Term: Participant verbalizes understanding of the signs/symptoms and immediate care of hyper/hypoglycemia, proper foot care and importance of medication, aerobic/resistive exercise and nutrition plan for blood glucose control.;Long Term: Attainment of HbA1C < 7%.    Hypertension Yes    Intervention Provide education on lifestyle modifcations including regular physical activity/exercise, weight management, moderate sodium restriction and increased consumption of fresh fruit, vegetables, and low fat dairy, alcohol moderation, and smoking cessation.;Monitor prescription use compliance.    Expected Outcomes Short Term: Continued assessment and intervention until BP is < 140/66m HG in hypertensive participants. < 130/846mHG in hypertensive participants with diabetes, heart failure or chronic kidney disease.;Long Term: Maintenance of blood pressure at goal levels.    Lipids Yes    Intervention Provide education and support for participant on nutrition & aerobic/resistive exercise along with prescribed medications to achieve LDL '70mg'$ , HDL >'40mg'$ .    Expected Outcomes Short Term: Participant states understanding of desired cholesterol values and is compliant with medications prescribed. Participant is following exercise prescription and nutrition  guidelines.;Long Term: Cholesterol controlled with medications as prescribed, with individualized exercise RX and with personalized nutrition plan. Value goals: LDL < '70mg'$ , HDL > 40 mg.             Education:Diabetes - Individual verbal and written instruction to review signs/symptoms of diabetes, desired ranges of glucose level fasting, after meals and with exercise. Acknowledge that pre and post exercise glucose checks will be done for 3 sessions at entry of program. [facility]  Education need identified 11/08/20  Date 11/08/20  Educator KLVersaillesInstruction Review Code 1- Verbalizes Understanding       Core Components/Risk Factors/Patient Goals Review:    Core Components/Risk Factors/Patient Goals at Discharge (Final Review):    ITP Comments:  ITP Comments     Row Name 11/02/20 1030 11/08/20 1047         ITP Comments Virtual orientation call completed today. he has an appointment on Date: 11/08/2020  for EP eval and gym Orientation.  [facility] . Completed 6MWT and gym orientation. Initial ITP created and sent for review to Dr. MaEmily FilbertMedical Director.               Comments: Initial ITP

## 2020-11-10 ENCOUNTER — Other Ambulatory Visit: Payer: Self-pay | Admitting: Internal Medicine

## 2020-11-12 ENCOUNTER — Encounter: Payer: Medicare Other | Admitting: *Deleted

## 2020-11-12 ENCOUNTER — Other Ambulatory Visit: Payer: Self-pay

## 2020-11-12 DIAGNOSIS — I252 Old myocardial infarction: Secondary | ICD-10-CM | POA: Diagnosis not present

## 2020-11-12 DIAGNOSIS — I214 Non-ST elevation (NSTEMI) myocardial infarction: Secondary | ICD-10-CM

## 2020-11-12 DIAGNOSIS — E119 Type 2 diabetes mellitus without complications: Secondary | ICD-10-CM | POA: Diagnosis not present

## 2020-11-12 LAB — GLUCOSE, CAPILLARY
Glucose-Capillary: 110 mg/dL — ABNORMAL HIGH (ref 70–99)
Glucose-Capillary: 105 mg/dL — ABNORMAL HIGH (ref 70–99)

## 2020-11-12 NOTE — Progress Notes (Signed)
Daily Session Note  Patient Details  Name: Jacob Harrison MRN: [id number] Date of Birth: February 22, 1941 Referring Provider:   Flowsheet Row Cardiac Rehab from 11/08/2020 in [facility] Cardiac and Pulmonary Rehab  Referring Provider End, Harrell Gave MD       Encounter Date: 11/12/2020  Check In:  Session Check In - 11/12/20 1011       Check-In   Supervising physician immediately available to respond to emergencies See telemetry face sheet for immediately available ER MD    Location [facility]-Cardiac & Pulmonary Rehab    Staff Present Heath Lark, RN, BSN, CCRP;Amanda Sommer, BA, ACSM CEP, Exercise Physiologist;Kara Eliezer Bottom, MS, ASCM CEP, Exercise Physiologist;Kelly Amedeo Plenty, BS, ACSM CEP, Exercise Physiologist    Virtual Visit No    Medication changes reported     No    Fall or balance concerns reported    No    Warm-up and Cool-down Performed on first and last piece of equipment    Resistance Training Performed Yes    VAD Patient? No    PAD/SET Patient? No      Pain Assessment   Currently in Pain? No/denies                Social History   Tobacco Use  Smoking Status Never  Smokeless Tobacco Never    Goals Met:  Exercise tolerated well Personal goals reviewed No report of cardiac concerns or symptoms  Goals Unmet:  Not Applicable  Comments: First full day of exercise!  Patient was oriented to gym and equipment including functions, settings, policies, and procedures.  Patient's individual exercise prescription and treatment plan were reviewed.  All starting workloads were established based on the results of the 6 minute walk test done at initial orientation visit.  The plan for exercise progression was also introduced and progression will be customized based on patient's performance and goals.    Dr. Emily Filbert is Medical Director for [facility].  Dr. Ottie Glazier is Medical Director for [facility] Pulmonary Rehabilitation.

## 2020-11-14 ENCOUNTER — Encounter: Payer: Self-pay | Admitting: Internal Medicine

## 2020-11-14 ENCOUNTER — Other Ambulatory Visit: Payer: Self-pay

## 2020-11-14 ENCOUNTER — Ambulatory Visit: Payer: Medicare Other | Admitting: Internal Medicine

## 2020-11-14 ENCOUNTER — Encounter: Payer: Self-pay | Admitting: *Deleted

## 2020-11-14 VITALS — BP 100/58 | HR 70 | Ht 67.0 in | Wt 173.0 lb

## 2020-11-14 DIAGNOSIS — I10 Essential (primary) hypertension: Secondary | ICD-10-CM | POA: Diagnosis not present

## 2020-11-14 DIAGNOSIS — E782 Mixed hyperlipidemia: Secondary | ICD-10-CM

## 2020-11-14 DIAGNOSIS — I25118 Atherosclerotic heart disease of native coronary artery with other forms of angina pectoris: Secondary | ICD-10-CM | POA: Diagnosis not present

## 2020-11-14 DIAGNOSIS — I5033 Acute on chronic diastolic (congestive) heart failure: Secondary | ICD-10-CM

## 2020-11-14 DIAGNOSIS — I5032 Chronic diastolic (congestive) heart failure: Secondary | ICD-10-CM | POA: Diagnosis not present

## 2020-11-14 DIAGNOSIS — E119 Type 2 diabetes mellitus without complications: Secondary | ICD-10-CM | POA: Diagnosis not present

## 2020-11-14 DIAGNOSIS — I214 Non-ST elevation (NSTEMI) myocardial infarction: Secondary | ICD-10-CM

## 2020-11-14 DIAGNOSIS — I252 Old myocardial infarction: Secondary | ICD-10-CM | POA: Diagnosis not present

## 2020-11-14 LAB — GLUCOSE, CAPILLARY
Glucose-Capillary: 113 mg/dL — ABNORMAL HIGH (ref 70–99)
Glucose-Capillary: 110 mg/dL — ABNORMAL HIGH (ref 70–99)

## 2020-11-14 NOTE — Progress Notes (Signed)
Follow-up Outpatient Visit Date: 11/14/2020  Primary Care Provider: [name], PA-C [place] [address]  Chief Complaint: Follow-up shortness of breath and leg swelling  HPI:  Mr. Quander is a 80 y.o. 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  I last saw him on 10/25/2020, at which time Mr. Ida reported considerable fatigue as well as leg swelling.  We agreed to increase furosemide to 40 mg twice daily and refer him for echocardiogram.  Echo still pending.  However, Mr. Sharpsteen reports that his leg swelling and fatigue are gradually improving.  He is happily participating with cardiac rehab.  He is able to walk up to 15 minutes at a time at home but sometimes still has to rest after just a few minutes due to dyspnea on exertion.  He has not had any chest pain or palpitations.  He has noticed some recent constipation.  --------------------------------------------------------------------------------------------------   Cardiovascular History & Procedures: Cardiovascular Problems: Coronary artery disease Ischemic cardiomyopathy Heart failure with preserved ejection fraction (HFpEF)   Risk Factors: Known coronary artery disease, hypertension, hyperlipidemia, diabetes mellitus, male gender, obesity, and age > 68   Cath/PCI: LHC (10/04/2020): LMCA with mild diffuse disease.  LAD with moderate diffuse proximal disease and chronic subtotal occlusion of the mid vessel.  Distal LAD fills via left to left and right to left collaterals.  LCx with moderate mid vessel disease.  RCA chronically occluded proximally, filling via left to right collaterals.  SVGs to RPDA, LAD, and OM are chronically occluded.   LIMA to LAD known to be atretic.  LVEDP 22 mmHg. LHC (10/18/12, [place], [place]): LMCA 20-30% ostial/proximal stenosis. LAD with sequential 100% and 95-99% mid stenoses. LCx with 40-50% mid stenosis. RCA with 100% proximal and 80-100% sequential distal lesions. RPDA and rPL1 have 99% and 95% stenoses, respectively. SVG->LAD and SVG->rPDA are patent. LVEF 67% with normal wall motion. LHC (04/05/10, [place], [place]): LMCA with 30% mid stenosis. LAD with 100% midvessel occlution. LCx with 40% mid stenosis. RCA with 100% proximal stenosis. LIMA->LAD atretic, SVG->LCx occluded, and SVG-> RCA with 80% ostial and 70% distal stenoses.   CV Surgery: Redo CABG (04/2010, [place], [place]): SVG->LAD and SVG->rPDA. CABG (2001, [place]): LIMA->LAD, SVG->LCx, and SVG->rPDA   EP Procedures and Devices: None   Non-Invasive Evaluation(s): TTE (09/23/2019): Normal LV size and wall thickness.  LVEF 60-65% with grade 1 diastolic dysfunction.  Normal RV size and function.  No atrial enlargement.  Aortic sclerosis without stenosis.  Normal CVP. Pharmacologic MPI (11/06/16): Low risk study without ischemia or scar. LVEF 48% (calculated) but visually appears normal. TTE (10/21/16): Normal LV size with mild LVH. LVEF 65-70% with normal diastolic function. Mild LA enlargement. Normal RV size and function. ABIs (03/08/15): Right 1.25, left 1.26; no significant change with exercise. Exercise myocardial perfusion stress test (10/07/12): Small to moderate in size, mild to moderate in severity, partially reversible defect involving the mid inferior and inferolateral segments. LVEF 65%.  Recent CV Pertinent Labs: Lab Results  Component Value Date   CHOL 140 08/31/2020   HDL 50 08/31/2020   LDLCALC 63 08/31/2020   TRIG 156 (H) 08/31/2020   CHOLHDL 2.8 08/31/2020   CHOLHDL 2.7 11/14/2016  BNP 599.9 (H) 10/25/2020   BNP 695.5 (H) 10/09/2020   K 4.6 10/25/2020   MG 1.9 02/25/2019   BUN 12 10/25/2020    CREATININE 0.71 (L) 10/25/2020    Past medical and surgical history were reviewed and updated in EPIC.  Current Meds  Medication Sig   acetaminophen (TYLENOL) 500 MG tablet Take 1,000 mg by mouth every 6 (six) hours as needed for moderate pain or headache.   aspirin EC 81 MG tablet Take 1 tablet (81 mg total) by mouth daily.   carvedilol (COREG) 12.5 MG tablet Take 1 tablet (12.5 mg total) by mouth 2 (two) times daily with a meal.   Cholecalciferol (VITAMIN D-3 PO) Take 800 Units by mouth daily.   clopidogrel (PLAVIX) 75 MG tablet Take 1 tablet (75 mg total) by mouth daily.   Coenzyme Q10 (CO Q-10) 200 MG CAPS Take 200 mg by mouth daily.    cyanocobalamin 500 MCG tablet Take 500 mcg by mouth daily.   diclofenac Sodium (VOLTAREN) 1 % GEL Apply 2 g topically 2 (two) times daily as needed.   ezetimibe (ZETIA) 10 MG tablet TAKE 1 TABLET BY MOUTH EVERY DAY   finasteride (PROSCAR) 5 MG tablet TAKE 1 TABLET BY MOUTH EVERY DAY   furosemide (LASIX) 40 MG tablet Take 1 tablet (40 mg total) by mouth 2 (two) times daily.   Glucosamine-Chondroit-Vit C-Mn (GLUCOSAMINE 1500 COMPLEX) CAPS Take 1 capsule by mouth daily.   isosorbide mononitrate (IMDUR) 30 MG 24 hr tablet Take 0.5 tablets (15 mg total) by mouth at bedtime.   Lancets (ONETOUCH ULTRASOFT) lancets Use to check blood sugars fasting daily   metFORMIN (GLUCOPHAGE) 500 MG tablet 1/2 tablet by mouth daily with food   Multiple Vitamins-Minerals (PRESERVISION AREDS 2 PO) Take 1 capsule by mouth 2 (two) times a day.   nitroGLYCERIN (NITROSTAT) 0.4 MG SL tablet Place 1 tablet (0.4 mg total) under the tongue every 5 (five) minutes as needed.   ranolazine (RANEXA) 1000 MG SR tablet Take 1 tablet by mouth twice daily   rosuvastatin (CRESTOR) 20 MG tablet TAKE 1 TABLET BY MOUTH EVERYDAY AT BEDTIME    Allergies: Other and Tetracyclines & related  Social History   Tobacco Use   Smoking status: Never   Smokeless tobacco: Never  Vaping Use   Vaping  Use: Never used  Substance Use Topics   Alcohol use: Not Currently    Alcohol/week: 11.0 standard drinks    Types: 5 Glasses of wine, 6 Cans of beer per week    Comment: 5-7 drinks a week (was 10-15 drinks/week)   Drug use: No    Family History  Problem Relation Age of Onset   Diabetes Mother    Hyperlipidemia Mother    Cancer Father        colon   Alcohol abuse Maternal Uncle    Diabetes Maternal Uncle    Hyperlipidemia Maternal Uncle    Cancer Paternal Uncle        colon    Review of Systems: A 12-system review of systems was performed and was negative except as noted in the HPI.  --------------------------------------------------------------------------------------------------  Physical Exam: BP (!) 100/58 (BP Location: Left Arm, Patient Position: Sitting, Cuff Size: Normal)   Pulse 70   Ht '5\' 7"'$  (1.702 m)   Wt 173 lb (78.5 kg)   SpO2 96%   BMI 27.10 kg/m   General:  NAD. Neck: No JVD or HJR. Lungs: Left basilar crackles.  No wheezes. Heart: Regular  rate and rhythm without murmurs, rubs, or gallops. Abdomen: Soft, nontender, nondistended. Extremities: Trace pretibial edema bilaterally.  EKG: Normal sinus rhythm with inferior and anterolateral infarcts.  No significant change from prior tracing on 10/25/2020.  Lab Results  Component Value Date   WBC 17.1 (H) 10/08/2020   HGB 12.3 (L) 10/08/2020   HCT 35.6 (L) 10/08/2020   MCV 90.1 10/08/2020   PLT 202 10/08/2020    Lab Results  Component Value Date   NA 140 10/25/2020   K 4.6 10/25/2020   CL 101 10/25/2020   CO2 22 10/25/2020   BUN 12 10/25/2020   CREATININE 0.71 (L) 10/25/2020   GLUCOSE 115 (H) 10/25/2020   ALT 19 08/31/2020    Lab Results  Component Value Date   CHOL 140 08/31/2020   HDL 50 08/31/2020   LDLCALC 63 08/31/2020   TRIG 156 (H) 08/31/2020   CHOLHDL 2.8 08/31/2020    --------------------------------------------------------------------------------------------------  ASSESSMENT  AND PLAN: Acute on chronic HFpEF: Volume status is improving as his functional capacity though Mr. Melka still has intermittent shortness of breath with modest activity consistent with NYHA class II-III heart failure.  Echo is pending at this time.  We will check a BMP to ensure stable renal function and potassium with plans to continue furosemide 40 mg twice daily if possible.  Further recommendations to follow echocardiogram later this month.  We will likely be limited in escalation of GDMT if LVEF is reduced given soft blood pressure.  We will need to consider discontinuation of isosorbide mononitrate to allow for more blood pressure room.  Coronary artery disease with stable angina: No chest pain reported.  Continue antianginal regimen consisting of carvedilol, isosorbide mononitrate, and ranolazine.  Continue cardiac rehab.  Hyperlipidemia: LDL at goal.  Continue rosuvastatin and ezetimibe.  Follow-up: Return to clinic in 1 month.  Nelva Bush, MD 11/14/2020 3:48 PM

## 2020-11-14 NOTE — Patient Instructions (Signed)
Medication Instructions:  Your physician recommends that you continue on your current medications as directed. Please refer to the Current Medication list given to you today.  *If you need a refill on your cardiac medications before your next appointment, please call your pharmacy*   Lab Work:  BMP ordered today  If you have labs (blood work) drawn today and your tests are completely normal, you will receive your results only by: [place] (if you have MyChart) OR A paper copy in the mail If you have any lab test that is abnormal or we need to change your treatment, we will call you to review the results.   Testing/Procedures: None ordered   Follow-Up: At [facility], [place], you and your health needs are our priority.  As part of our continuing mission to provide you with exceptional heart care, we have created designated Provider Care Teams.  These Care Teams include your primary Cardiologist (physician) and Advanced Practice Providers (APPs -  Physician Assistants and Nurse Practitioners) who all work together to provide you with the care you need, when you need it.  We recommend signing up for the patient portal called "MyChart".  Sign up information is provided on this After Visit Summary.  MyChart is used to connect with patients for Virtual Visits (Telemedicine).  Patients are able to view lab/test results, encounter notes, upcoming appointments, etc.  Non-urgent messages can be sent to your provider as well.   To learn more about what you can do with MyChart, go to [email or web address].    Your next appointment:   1 month(s)  The format for your next appointment:   In Person  Provider:   You may see Nelva Bush, MD or one of the following Advanced Practice Providers on your designated Care Team:   Murray Hodgkins, NP Christell Faith, PA-C Marrianne Mood, PA-C [place]ence [place], [place]

## 2020-11-14 NOTE — Progress Notes (Signed)
Daily Session Note  Patient Details  Name: Jacob Harrison MRN: [id number] Date of Birth: Dec 20, 1940 Referring Provider:   Flowsheet Row Cardiac Rehab from 11/08/2020 in [facility] Cardiac and Pulmonary Rehab  Referring Provider End, Harrell Gave MD       Encounter Date: 11/14/2020  Check In:  Session Check In - 11/14/20 0945       Check-In   Supervising physician immediately available to respond to emergencies See telemetry face sheet for immediately available ER MD    Location [facility]-Cardiac & Pulmonary Rehab    Staff Present Birdie Sons, MPA, Nino Glow, MS, ASCM CEP, Exercise Physiologist;Amanda Oletta Darter, BA, ACSM CEP, Exercise Physiologist    Virtual Visit No    Medication changes reported     No    Fall or balance concerns reported    No    Warm-up and Cool-down Performed on first and last piece of equipment    Resistance Training Performed Yes    VAD Patient? No    PAD/SET Patient? No      Pain Assessment   Currently in Pain? No/denies                Social History   Tobacco Use  Smoking Status Never  Smokeless Tobacco Never    Goals Met:  Independence with exercise equipment Exercise tolerated well No report of cardiac concerns or symptoms Strength training completed today  Goals Unmet:  Not Applicable  Comments: Pt able to follow exercise prescription today without complaint.  Will continue to monitor for progression.    Dr. Emily Filbert is Medical Director for [facility].  Dr. Ottie Glazier is Medical Director for [facility] Pulmonary Rehabilitation.

## 2020-11-14 NOTE — Progress Notes (Signed)
Cardiac Individual Treatment Plan  Patient Details  Name: Jacob Harrison MRN: [id number] Date of Birth: 1940/06/17 Referring Provider:   Flowsheet Row Cardiac Rehab from 11/08/2020 in [facility] Cardiac and Pulmonary Rehab  Referring Provider End, Harrell Gave MD       Initial Encounter Date:  Flowsheet Row Cardiac Rehab from 11/08/2020 in [facility] Cardiac and Pulmonary Rehab  Date 11/08/20       Visit Diagnosis: NSTEMI (non-ST elevation myocardial infarction) [facility])  Patient's Home Medications on Admission:  Current Outpatient Medications:    acetaminophen (TYLENOL) 500 MG tablet, Take 1,000 mg by mouth every 6 (six) hours as needed for moderate pain or headache., Disp: , Rfl:    aspirin EC 81 MG tablet, Take 1 tablet (81 mg total) by mouth daily., Disp: , Rfl:    carvedilol (COREG) 12.5 MG tablet, Take 1 tablet (12.5 mg total) by mouth 2 (two) times daily with a meal., Disp: 60 tablet, Rfl: 2   carvedilol (COREG) 3.125 MG tablet, TAKE 1 TABLET (3.125 MG TOTAL) BY MOUTH 2 (TWO) TIMES DAILY WITH A MEAL., Disp: 180 tablet, Rfl: 0   Cholecalciferol (VITAMIN D-3 PO), Take 800 Units by mouth daily., Disp: , Rfl:    clopidogrel (PLAVIX) 75 MG tablet, Take 1 tablet (75 mg total) by mouth daily., Disp: 90 tablet, Rfl: 1   Coenzyme Q10 (CO Q-10) 200 MG CAPS, Take 200 mg by mouth daily. , Disp: , Rfl:    cyanocobalamin 500 MCG tablet, Take 500 mcg by mouth daily., Disp: , Rfl:    diclofenac Sodium (VOLTAREN) 1 % GEL, Apply 2 g topically 2 (two) times daily as needed., Disp: 120 g, Rfl: 0   ezetimibe (ZETIA) 10 MG tablet, TAKE 1 TABLET BY MOUTH EVERY DAY, Disp: 90 tablet, Rfl: 0   finasteride (PROSCAR) 5 MG tablet, TAKE 1 TABLET BY MOUTH EVERY DAY, Disp: 90 tablet, Rfl: 0   furosemide (LASIX) 40 MG tablet, Take 1 tablet (40 mg total) by mouth 2 (two) times daily., Disp: 60 tablet, Rfl: 1   Glucosamine-Chondroit-Vit C-Mn (GLUCOSAMINE 1500 COMPLEX) CAPS, Take 1 capsule by mouth daily., Disp: , Rfl:     isosorbide mononitrate (IMDUR) 30 MG 24 hr tablet, Take 0.5 tablets (15 mg total) by mouth at bedtime., Disp: 15 tablet, Rfl: 2   Lancets (ONETOUCH ULTRASOFT) lancets, Use to check blood sugars fasting daily, Disp: 100 each, Rfl: 12   metFORMIN (GLUCOPHAGE) 500 MG tablet, 1/2 tablet by mouth daily with food, Disp: 90 tablet, Rfl: 2   Multiple Vitamins-Minerals (PRESERVISION AREDS 2 PO), Take 1 capsule by mouth 2 (two) times a day., Disp: , Rfl:    nitroGLYCERIN (NITROSTAT) 0.4 MG SL tablet, Place 1 tablet (0.4 mg total) under the tongue every 5 (five) minutes as needed., Disp: 25 tablet, Rfl: 3   ranolazine (RANEXA) 1000 MG SR tablet, Take 1 tablet by mouth twice daily, Disp: 180 tablet, Rfl: 3   rosuvastatin (CRESTOR) 20 MG tablet, TAKE 1 TABLET BY MOUTH EVERYDAY AT BEDTIME, Disp: 90 tablet, Rfl: 0  Past Medical History: Past Medical History:  Diagnosis Date   (HFpEF) heart failure with preserved ejection fraction ([place])    a. 10/2016 Echo: EF 65-70%, mild LVH, mildly dil LA. RV fxn nl.   Arthritis    BPH (benign prostatic hyperplasia)    Coronary artery disease    a. 2001 CABG x 3 [facility]): LIMA->LAD, VG->LCX, VG->RPDA; b. 04/05/2010 Cath ([place]): LM 71m LAD 1034mLCX 4080mCA 100p, LIMA->LAD atretic, VG->LCX 100,  VG->RCA 80ost, 70d; c. 04/2010 Redo CABG x 2 ([place]): VG->LAD, VG->RPDA; d. 10/2012 Cath [facility]): LM 20-30, LAD 100/95-54m LCX 40-544mRCA 100p, 80-100d, RPDA 99, RPL1 95, VG->LAD ok, VG->RPDA ok, EF 67%; e. 11/2016 MV: EF 48%, No ischemia.   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   Hyperlipidemia    Hypertension    Scoliosis    Spinal stenosis     Tobacco Use: Social History   Tobacco Use  Smoking Status Never  Smokeless Tobacco Never    Labs: Recent Review Flowsheet Data     Labs for ITP Cardiac and Pulmonary Rehab Latest Ref Rng & Units 02/22/2019 05/17/2019 08/15/2019 04/16/2020 08/31/2020   Cholestrol 100 - 199 mg/dL - - 153 160 140   LDLCALC 0 -  99 mg/dL - - 71 69 63   HDL >39 mg/dL - - 67 69 50   Trlycerides 0 - 149 mg/dL - - 76 129 156(H)   Hemoglobin A1c 4.8 - 5.6 % 5.8(A) 5.8(A) 5.5 6.1(H) 6.1(H)        Exercise Target Goals: Exercise Program Goal: Individual exercise prescription set using results from initial 6 min walk test and THRR while considering  patient's activity barriers and safety.   Exercise Prescription Goal: Initial exercise prescription builds to 30-45 minutes a day of aerobic activity, 2-3 days per week.  Home exercise guidelines will be given to patient during program as part of exercise prescription that the participant will acknowledge.   Education: Aerobic Exercise: - Group verbal and visual presentation on the components of exercise prescription. Introduces F.I.T.T principle from ACSM for exercise prescriptions.  Reviews F.I.T.T. principles of aerobic exercise including progression. Written material given at graduation.   Education: Resistance Exercise: - Group verbal and visual presentation on the components of exercise prescription. Introduces F.I.T.T principle from ACSM for exercise prescriptions  Reviews F.I.T.T. principles of resistance exercise including progression. Written material given at graduation.    Education: Exercise & Equipment Safety: - Individual verbal instruction and demonstration of equipment use and safety with use of the equipment. [facility]  Education need identified 11/08/20  Date 11/08/20  Educator [place] LodgeInstruction Review Code 1- Verbalizes Understanding       Education: Exercise Physiology & General Exercise Guidelines: - Group verbal and written instruction with models to review the exercise physiology of the cardiovascular system and associated critical values. Provides general exercise guidelines with specific guidelines to those with heart or lung disease.    Education: Flexibility, Balance,  Mind/Body Relaxation: - Group verbal and visual presentation with interactive activity on the components of exercise prescription. Introduces F.I.T.T principle from ACSM for exercise prescriptions. Reviews F.I.T.T. principles of flexibility and balance exercise training including progression. Also discusses the mind body connection.  Reviews various relaxation techniques to help reduce and manage stress (i.e. Deep breathing, progressive muscle relaxation, and visualization). Balance handout provided to take home. Written material given at graduation.   Activity Barriers & Risk Stratification:  Activity Barriers & Cardiac Risk Stratification - 11/08/20 1113       Activity Barriers & Cardiac Risk Stratification   Activity Barriers Deconditioning;Muscular Weakness;Other (comment)    Comments Diabetic neuropathy, torn left rotator cuff    Cardiac Risk Stratification High             6 Minute Walk:  6 Minute Walk     Row Name 11/08/20 1119  6 Minute Walk   Phase Initial     Distance 790 feet     Walk Time 6 minutes     # of Rest Breaks 0     MPH 1.49     METS 1.29     RPE 13     Perceived Dyspnea  2     VO2 Peak 4.53     Symptoms Yes (comment)     Comments Slight SOB- resolved with rest     Resting HR 59 bpm     Resting BP 96/62     Resting Oxygen Saturation  97 %     Exercise Oxygen Saturation  during 6 min walk 97 %     Max Ex. HR 85 bpm     Max Ex. BP 112/64     2 Minute Post BP 108/62              Oxygen Initial Assessment:   Oxygen Re-Evaluation:   Oxygen Discharge (Final Oxygen Re-Evaluation):   Initial Exercise Prescription:  Initial Exercise Prescription - 11/08/20 1100       Date of Initial Exercise RX and Referring Provider   Date 11/08/20    Referring Provider End, Harrell Gave MD      Treadmill   MPH 1.3    Grade 0    Minutes 15    METs 2      Recumbant Bike   Level 1    RPM 60    Watts 10    Minutes 15    METs 1.3       NuStep   Level 1    SPM 80    Minutes 15    METs 1.3      Biostep-RELP   Level 1    SPM 50    Minutes 15    METs 1.3      Prescription Details   Frequency (times per week) 3    Duration Progress to 30 minutes of continuous aerobic without signs/symptoms of physical distress      Intensity   THRR 40-80% of Max Heartrate 92-126    Ratings of Perceived Exertion 11-13    Perceived Dyspnea 0-4      Progression   Progression Continue to progress workloads to maintain intensity without signs/symptoms of physical distress.      Resistance Training   Training Prescription Yes    Weight 3 lb    Reps 10-15             Perform Capillary Blood Glucose checks as needed.  Exercise Prescription Changes:   Exercise Prescription Changes     Row Name 11/08/20 1100             Response to Exercise   Blood Pressure (Admit) 96/92       Blood Pressure (Exercise) 112/64       Blood Pressure (Exit) 108/62       Heart Rate (Admit) 59 bpm       Heart Rate (Exercise) 85 bpm       Heart Rate (Exit) 70 bpm       Oxygen Saturation (Admit) 97 %       Oxygen Saturation (Exercise) 97 %       Oxygen Saturation (Exit) 97 %       Rating of Perceived Exertion (Exercise) 13       Perceived Dyspnea (Exercise) 2       Symptoms Slight SOB  Comments walk test results                Exercise Comments:   Exercise Comments     Row Name 11/12/20 1012           Exercise Comments First full day of exercise!  Patient was oriented to gym and equipment including functions, settings, policies, and procedures.  Patient's individual exercise prescription and treatment plan were reviewed.  All starting workloads were established based on the results of the 6 minute walk test done at initial orientation visit.  The plan for exercise progression was also introduced and progression will be customized based on patient's performance and goals.                Exercise Goals and  Review:   Exercise Goals     Row Name 11/08/20 1127             Exercise Goals   Increase Physical Activity Yes       Intervention Provide advice, education, support and counseling about physical activity/exercise needs.;Develop an individualized exercise prescription for aerobic and resistive training based on initial evaluation findings, risk stratification, comorbidities and participant's personal goals.       Expected Outcomes Short Term: Attend rehab on a regular basis to increase amount of physical activity.;Long Term: Add in home exercise to make exercise part of routine and to increase amount of physical activity.;Long Term: Exercising regularly at least 3-5 days a week.       Increase Strength and Stamina Yes       Intervention Provide advice, education, support and counseling about physical activity/exercise needs.;Develop an individualized exercise prescription for aerobic and resistive training based on initial evaluation findings, risk stratification, comorbidities and participant's personal goals.       Expected Outcomes Short Term: Increase workloads from initial exercise prescription for resistance, speed, and METs.;Short Term: Perform resistance training exercises routinely during rehab and add in resistance training at home;Long Term: Improve cardiorespiratory fitness, muscular endurance and strength as measured by increased METs and functional capacity (6MWT)       Able to understand and use rate of perceived exertion (RPE) scale Yes       Intervention Provide education and explanation on how to use RPE scale       Expected Outcomes Short Term: Able to use RPE daily in rehab to express subjective intensity level;Long Term:  Able to use RPE to guide intensity level when exercising independently       Able to understand and use Dyspnea scale Yes       Intervention Provide education and explanation on how to use Dyspnea scale       Expected Outcomes Short Term: Able to use  Dyspnea scale daily in rehab to express subjective sense of shortness of breath during exertion;Long Term: Able to use Dyspnea scale to guide intensity level when exercising independently       Knowledge and understanding of Target Heart Rate Range (THRR) Yes       Intervention Provide education and explanation of THRR including how the numbers were predicted and where they are located for reference       Expected Outcomes Short Term: Able to state/look up THRR;Long Term: Able to use THRR to govern intensity when exercising independently;Short Term: Able to use daily as guideline for intensity in rehab       Able to check pulse independently Yes       Intervention Provide education and  demonstration on how to check pulse in carotid and radial arteries.;Review the importance of being able to check your own pulse for safety during independent exercise       Expected Outcomes Short Term: Able to explain why pulse checking is important during independent exercise;Long Term: Able to check pulse independently and accurately       Understanding of Exercise Prescription Yes       Intervention Provide education, explanation, and written materials on patient's individual exercise prescription       Expected Outcomes Short Term: Able to explain program exercise prescription;Long Term: Able to explain home exercise prescription to exercise independently                Exercise Goals Re-Evaluation :  Exercise Goals Re-Evaluation     Row Name 11/12/20 1013             Exercise Goal Re-Evaluation   Exercise Goals Review Understanding of Exercise Prescription;Knowledge and understanding of Target Heart Rate Range (THRR);Able to understand and use Dyspnea scale;Able to understand and use rate of perceived exertion (RPE) scale       Comments Reviewed RPE and dyspnea scales, THR and program prescription with pt today.  Pt voiced understanding and was given a copy of goals to take home.       Expected  Outcomes Short: Use RPE daily to regulate intensity. Long: Follow program prescription in THR.                Discharge Exercise Prescription (Final Exercise Prescription Changes):  Exercise Prescription Changes - 11/08/20 1100       Response to Exercise   Blood Pressure (Admit) 96/92    Blood Pressure (Exercise) 112/64    Blood Pressure (Exit) 108/62    Heart Rate (Admit) 59 bpm    Heart Rate (Exercise) 85 bpm    Heart Rate (Exit) 70 bpm    Oxygen Saturation (Admit) 97 %    Oxygen Saturation (Exercise) 97 %    Oxygen Saturation (Exit) 97 %    Rating of Perceived Exertion (Exercise) 13    Perceived Dyspnea (Exercise) 2    Symptoms Slight SOB    Comments walk test results             Nutrition:  Target Goals: Understanding of nutrition guidelines, daily intake of sodium '1500mg'$ , cholesterol '200mg'$ , calories 30% from fat and 7% or less from saturated fats, daily to have 5 or more servings of fruits and vegetables.  Education: All About Nutrition: -Group instruction provided by verbal, written material, interactive activities, discussions, models, and posters to present general guidelines for heart healthy nutrition including fat, fiber, MyPlate, the role of sodium in heart healthy nutrition, utilization of the nutrition label, and utilization of this knowledge for meal planning. Follow up email sent as well. Written material given at graduation.   Biometrics:  Pre Biometrics - 11/08/20 1113       Pre Biometrics   Height 5' 6.1" (1.679 m)    Weight 176 lb 3.2 oz (79.9 kg)    BMI (Calculated) 28.35    Single Leg Stand 11.6 seconds              Nutrition Therapy Plan and Nutrition Goals:   Nutrition Assessments:  MEDIFICTS Score Key: ?70 Need to make dietary changes  40-70 Heart Healthy Diet ? 40 Therapeutic Level Cholesterol Diet  Flowsheet Row Cardiac Rehab from 11/08/2020 in [facility] Cardiac and Pulmonary Rehab  Picture Your Plate Total  Score on Admission  77      Picture Your Plate Scores: [id number] Unhealthy dietary pattern with much room for improvement. 41-50 Dietary pattern unlikely to meet recommendations for good health and room for improvement. 51-60 More healthful dietary pattern, with some room for improvement.  >60 Healthy dietary pattern, although there may be some specific behaviors that could be improved.    Nutrition Goals Re-Evaluation:   Nutrition Goals Discharge (Final Nutrition Goals Re-Evaluation):   Psychosocial: Target Goals: Acknowledge presence or absence of significant depression and/or stress, maximize coping skills, provide positive support system. Participant is able to verbalize types and ability to use techniques and skills needed for reducing stress and depression.   Education: Stress, Anxiety, and Depression - Group verbal and visual presentation to define topics covered.  Reviews how body is impacted by stress, anxiety, and depression.  Also discusses healthy ways to reduce stress and to treat/manage anxiety and depression.  Written material given at graduation.   Education: Sleep Hygiene -Provides group verbal and written instruction about how sleep can affect your health.  Define sleep hygiene, discuss sleep cycles and impact of sleep habits. Review good sleep hygiene tips.    Initial Review & Psychosocial Screening:  Initial Psych Review & Screening - 11/02/20 1013       Initial Review   Current issues with None Identified      Family Dynamics   Good Support System? Yes   Wife of 54 years, Children live in [place].     Barriers   Psychosocial barriers to participate in program There are no identifiable barriers or psychosocial needs.      Screening Interventions   Interventions Encouraged to exercise    Expected Outcomes Short Term goal: Utilizing psychosocial counselor, staff and physician to assist with identification of specific Stressors or current issues interfering with healing process.  Setting desired goal for each stressor or current issue identified.;Long Term Goal: Stressors or current issues are controlled or eliminated.;Short Term goal: Identification and review with participant of any Quality of Life or Depression concerns found by scoring the questionnaire.;Long Term goal: The participant improves quality of Life and PHQ9 Scores as seen by post scores and/or verbalization of changes             Quality of Life Scores:   Quality of Life - 11/08/20 1110       Quality of Life   Select Quality of Life      Quality of Life Scores   Health/Function Pre 16.27 %    Socioeconomic Pre 25 %    Psych/Spiritual Pre 25.57 %    Family Pre 28.5 %    GLOBAL Pre 21.87 %            Scores of 19 and below usually indicate a poorer quality of life in these areas.  A difference of  2-3 points is a clinically meaningful difference.  A difference of 2-3 points in the total score of the Quality of Life Index has been associated with significant improvement in overall quality of life, self-image, physical symptoms, and general health in studies assessing change in quality of life.  PHQ-9: Recent Review Flowsheet Data     Depression screen [facility] 2/9 11/08/2020 09/04/2020 07/03/2020 04/18/2020 12/15/2019   Decreased Interest 1 0 0 0 0   Down, Depressed, Hopeless 0 0 0 0 0   PHQ - 2 Score 1 0 0 0 0   Altered sleeping '1 1 1 1 '$ 2  Tired, decreased energy '2 1 1 '$ 0 0   Change in appetite 1 0 0 0 0   Feeling bad or failure about yourself  0 0 0 0 0   Trouble concentrating 1 0 0 0 0   Moving slowly or fidgety/restless 1 1 0 0 0   Suicidal thoughts 0 0 0 0 0   PHQ-9 Score '7 3 2 1 2   '$ Difficult doing work/chores Somewhat difficult Somewhat difficult - Not difficult at all Not difficult at all      Interpretation of Total Score  Total Score Depression Severity:  1-4 = Minimal depression, 5-9 = Mild depression, 10-14 = Moderate depression, 15-19 = Moderately severe depression, 20-27 =  Severe depression   Psychosocial Evaluation and Intervention:  Psychosocial Evaluation - 11/02/20 1020       Psychosocial Evaluation & Interventions   Interventions Encouraged to exercise with the program and follow exercise prescription    Comments Denice Paradise has no barriers to attending the program. He was in the hospital in bed for 6 days and feels his strength and stamina is decreased. He hopes to see improvement in both by attending the program. He had significant weight loss in the hospital,and has continued with some weight loss;he has a goal of 165 lb. He lives with his wife and has children within 15- 20 minutes. THey are all part of his support team. He should do well with the program.    Expected Outcomes STG: Rich attends all scheduled sessions He will expoereince an  increase in  strength and stamina LTG: Rich will comtinue withhis exercise progression, and continue with his weight loss to his goal of 165 lb.    Continue Psychosocial Services  Follow up required by staff             Psychosocial Re-Evaluation:   Psychosocial Discharge (Final Psychosocial Re-Evaluation):   Vocational Rehabilitation: Provide vocational rehab assistance to qualifying candidates.   Vocational Rehab Evaluation & Intervention:   Education: Education Goals: Education classes will be provided on a variety of topics geared toward better understanding of heart health and risk factor modification. Participant will state understanding/return demonstration of topics presented as noted by education test scores.  Learning Barriers/Preferences:   General Cardiac Education Topics:  AED/CPR: - Group verbal and written instruction with the use of models to demonstrate the basic use of the AED with the basic ABC's of resuscitation.   Anatomy and Cardiac Procedures: - Group verbal and visual presentation and models provide information about basic cardiac anatomy and function. Reviews the testing methods  done to diagnose heart disease and the outcomes of the test results. Describes the treatment choices: Medical Management, Angioplasty, or Coronary Bypass Surgery for treating various heart conditions including Myocardial Infarction, Angina, Valve Disease, and Cardiac Arrhythmias.  Written material given at graduation.   Medication Safety: - Group verbal and visual instruction to review commonly prescribed medications for heart and lung disease. Reviews the medication, class of the drug, and side effects. Includes the steps to properly store meds and maintain the prescription regimen.  Written material given at graduation.   Intimacy: - Group verbal instruction through game format to discuss how heart and lung disease can affect sexual intimacy. Written material given at graduation..   Know Your Numbers and Heart Failure: - Group verbal and visual instruction to discuss disease risk factors for cardiac and pulmonary disease and treatment options.  Reviews associated critical values for Overweight/Obesity, Hypertension, Cholesterol, and Diabetes.  Discusses  basics of heart failure: signs/symptoms and treatments.  Introduces Heart Failure Zone chart for action plan for heart failure.  Written material given at graduation.   Infection Prevention: - Provides verbal and written material to individual with discussion of infection control including proper hand washing and proper equipment cleaning during exercise session. Flowsheet Row Cardiac Rehab from 11/08/2020 in [facility] Cardiac and Pulmonary Rehab  Education need identified 11/08/20  Date 11/08/20  Educator [place]  Instruction Review Code 1- Verbalizes Understanding       Falls Prevention: - Provides verbal and written material to individual with discussion of falls prevention and safety. Flowsheet Row Cardiac Rehab from 11/08/2020 in [facility] Cardiac and Pulmonary Rehab  Education need identified 11/08/20  Date 11/08/20  Educator [place]  Instruction  Review Code 1- Verbalizes Understanding       Other: -Provides group and verbal instruction on various topics (see comments)   Knowledge Questionnaire Score:  Knowledge Questionnaire Score - 11/08/20 1112       Knowledge Questionnaire Score   Pre Score 25/26: Angina             Core Components/Risk Factors/Patient Goals at Admission:  Personal Goals and Risk Factors at Admission - 11/08/20 1127       Core Components/Risk Factors/Patient Goals on Admission    Weight Management Yes;Weight Loss    Intervention Weight Management: Develop a combined nutrition and exercise program designed to reach desired caloric intake, while maintaining appropriate intake of nutrient and fiber, sodium and fats, and appropriate energy expenditure required for the weight goal.;Weight Management: Provide education and appropriate resources to help participant work on and attain dietary goals.    Admit Weight 176 lb (79.8 kg)    Goal Weight: Short Term 171 lb (77.6 kg)    Goal Weight: Long Term 160 lb (72.6 kg)   Goal per patient   Expected Outcomes Short Term: Continue to assess and modify interventions until short term weight is achieved;Weight Loss: Understanding of general recommendations for a balanced deficit meal plan, which promotes 1-2 lb weight loss per week and includes a negative energy balance of [phone] kcal/d;Understanding of distribution of calorie intake throughout the day with the consumption of 4-5 meals/snacks;Understanding recommendations for meals to include 15-35% energy as protein, 25-35% energy from fat, 35-60% energy from carbohydrates, less than '200mg'$  of dietary cholesterol, 20-35 gm of total fiber daily    Diabetes Yes    Intervention Provide education about signs/symptoms and action to take for hypo/hyperglycemia.;Provide education about proper nutrition, including hydration, and aerobic/resistive exercise prescription along with prescribed medications to achieve blood glucose  in normal ranges: Fasting glucose 65-99 mg/dL    Expected Outcomes Short Term: Participant verbalizes understanding of the signs/symptoms and immediate care of hyper/hypoglycemia, proper foot care and importance of medication, aerobic/resistive exercise and nutrition plan for blood glucose control.;Long Term: Attainment of HbA1C < 7%.    Hypertension Yes    Intervention Provide education on lifestyle modifcations including regular physical activity/exercise, weight management, moderate sodium restriction and increased consumption of fresh fruit, vegetables, and low fat dairy, alcohol moderation, and smoking cessation.;Monitor prescription use compliance.    Expected Outcomes Short Term: Continued assessment and intervention until BP is < 140/37m HG in hypertensive participants. < 130/851mHG in hypertensive participants with diabetes, heart failure or chronic kidney disease.;Long Term: Maintenance of blood pressure at goal levels.    Lipids Yes    Intervention Provide education and support for participant on nutrition & aerobic/resistive exercise along with prescribed  medications to achieve LDL '70mg'$ , HDL >'40mg'$ .    Expected Outcomes Short Term: Participant states understanding of desired cholesterol values and is compliant with medications prescribed. Participant is following exercise prescription and nutrition guidelines.;Long Term: Cholesterol controlled with medications as prescribed, with individualized exercise RX and with personalized nutrition plan. Value goals: LDL < '70mg'$ , HDL > 40 mg.             Education:Diabetes - Individual verbal and written instruction to review signs/symptoms of diabetes, desired ranges of glucose level fasting, after meals and with exercise. Acknowledge that pre and post exercise glucose checks will be done for 3 sessions at entry of program. [place] from 11/08/2020 in [facility] Cardiac and Pulmonary Rehab  Education need identified 11/08/20  Date  11/08/20  Educator [place]  Instruction Review Code 1- Verbalizes Understanding       Core Components/Risk Factors/Patient Goals Review:    Core Components/Risk Factors/Patient Goals at Discharge (Final Review):    ITP Comments:  ITP Comments     Row Name 11/02/20 1030 11/08/20 1047 11/12/20 1012 11/14/20 0815     ITP Comments Virtual orientation call completed today. he has an appointment on Date: 11/08/2020  for EP eval and gym Orientation.  Documentation of diagnosis can be found in [facility] Date: 10/04/2020 . Completed 6MWT and gym orientation. Initial ITP created and sent for review to Dr. Emily Filbert, Medical Director. First full day of exercise!  Patient was oriented to gym and equipment including functions, settings, policies, and procedures.  Patient's individual exercise prescription and treatment plan were reviewed.  All starting workloads were established based on the results of the 6 minute walk test done at initial orientation visit.  The plan for exercise progression was also introduced and progression will be customized based on patient's performance and goals. 30 Day review completed. Medical Director ITP review done, changes made as directed, and signed approval by Medical Director.    New to program             Comments:

## 2020-11-15 LAB — BASIC METABOLIC PANEL
BUN/Creatinine Ratio: 19 (ref 10–24)
BUN: 15 mg/dL (ref 8–27)
CO2: 24 mmol/L (ref 20–29)
Calcium: 10.1 mg/dL (ref 8.6–10.2)
Chloride: 97 mmol/L (ref 96–106)
Creatinine, Ser: 0.77 mg/dL (ref 0.76–1.27)
Glucose: 116 mg/dL — ABNORMAL HIGH (ref 65–99)
Potassium: 4.2 mmol/L (ref 3.5–5.2)
Sodium: 138 mmol/L (ref 134–144)
eGFR: 92 mL/min/{1.73_m2} (ref >59)

## 2020-11-16 ENCOUNTER — Encounter: Payer: Medicare Other | Admitting: *Deleted

## 2020-11-16 ENCOUNTER — Other Ambulatory Visit: Payer: Self-pay | Admitting: Physician Assistant

## 2020-11-16 ENCOUNTER — Other Ambulatory Visit: Payer: Self-pay

## 2020-11-16 DIAGNOSIS — I252 Old myocardial infarction: Secondary | ICD-10-CM | POA: Diagnosis not present

## 2020-11-16 DIAGNOSIS — I214 Non-ST elevation (NSTEMI) myocardial infarction: Secondary | ICD-10-CM

## 2020-11-16 DIAGNOSIS — E119 Type 2 diabetes mellitus without complications: Secondary | ICD-10-CM | POA: Diagnosis not present

## 2020-11-16 LAB — GLUCOSE, CAPILLARY
Glucose-Capillary: 113 mg/dL — ABNORMAL HIGH (ref 70–99)
Glucose-Capillary: 98 mg/dL (ref 70–99)

## 2020-11-16 NOTE — Progress Notes (Signed)
Daily Session Note  Patient Details  Name: Jacob Harrison MRN: [id number] Date of Birth: Feb 01, 1941 Referring Provider:   Flowsheet Row Cardiac Rehab from 11/08/2020 in [facility] Cardiac and Pulmonary Rehab  Referring Provider End, [place] Gave MD       Encounter Date: 11/16/2020  Check In:  Session Check In - 11/16/20 1035       Check-In   Supervising physician immediately available to respond to emergencies See telemetry face sheet for immediately available ER MD    Location [facility]-Cardiac & Pulmonary Rehab    Staff Present Heath Lark, RN, BSN, CCRP;Melissa Graball, RDN, LDN;Jessica [place], MA, RCEP, CCRP, CCET    Virtual Visit No    Medication changes reported     No    Fall or balance concerns reported    No    Warm-up and Cool-down Performed on first and last piece of equipment    Resistance Training Performed Yes    VAD Patient? No    PAD/SET Patient? No      Pain Assessment   Currently in Pain? No/denies                Social History   Tobacco Use  Smoking Status Never  Smokeless Tobacco Never    Goals Met:  Independence with exercise equipment Exercise tolerated well No report of cardiac concerns or symptoms  Goals Unmet:  Not Applicable  Comments: Pt able to follow exercise prescription today without complaint.  Will continue to monitor for progression.    Dr. Emily Filbert is Medical Director for [place].  Dr. Ottie Glazier is Medical Director for [facility] Pulmonary Rehabilitation.

## 2020-11-17 ENCOUNTER — Other Ambulatory Visit: Payer: Self-pay | Admitting: Physician Assistant

## 2020-11-17 DIAGNOSIS — R351 Nocturia: Secondary | ICD-10-CM

## 2020-11-17 DIAGNOSIS — N401 Enlarged prostate with lower urinary tract symptoms: Secondary | ICD-10-CM

## 2020-11-21 ENCOUNTER — Other Ambulatory Visit: Payer: Self-pay | Admitting: Internal Medicine

## 2020-11-21 ENCOUNTER — Other Ambulatory Visit: Payer: Self-pay

## 2020-11-21 DIAGNOSIS — I252 Old myocardial infarction: Secondary | ICD-10-CM | POA: Diagnosis not present

## 2020-11-21 DIAGNOSIS — E119 Type 2 diabetes mellitus without complications: Secondary | ICD-10-CM | POA: Diagnosis not present

## 2020-11-21 DIAGNOSIS — I214 Non-ST elevation (NSTEMI) myocardial infarction: Secondary | ICD-10-CM

## 2020-11-21 NOTE — Progress Notes (Signed)
Daily Session Note  Patient Details  Name: Jacob Harrison MRN: [id number] Date of Birth: 18-Sep-1940 Referring Provider:   Flowsheet Row Cardiac Rehab from 11/08/2020 in [facility] Cardiac and Pulmonary Rehab  Referring Provider End, [place] Gave MD       Encounter Date: 11/21/2020  Check In:  Session Check In - 11/21/20 0958       Check-In   Supervising physician immediately available to respond to emergencies See telemetry face sheet for immediately available ER MD    Location [facility]-Cardiac & Pulmonary Rehab    Staff Present Birdie Sons, MPA, RN;Laureen Owens Shark, BS, RRT, CPFT;Joseph [place], [place]    Virtual Visit No    Medication changes reported     No    Fall or balance concerns reported    No    Warm-up and Cool-down Performed on first and last piece of equipment    Resistance Training Performed Yes    VAD Patient? No    PAD/SET Patient? No      Pain Assessment   Currently in Pain? No/denies                Social History   Tobacco Use  Smoking Status Never  Smokeless Tobacco Never    Goals Met:  Independence with exercise equipment Exercise tolerated well No report of cardiac concerns or symptoms Strength training completed today  Goals Unmet:  Not Applicable  Comments: Pt able to follow exercise prescription today without complaint.  Will continue to monitor for progression.    Dr. Emily Filbert is Medical Director for [place].  Dr. Ottie Glazier is Medical Director for [facility] Pulmonary Rehabilitation.

## 2020-11-23 ENCOUNTER — Encounter: Payer: Medicare Other | Admitting: *Deleted

## 2020-11-23 ENCOUNTER — Other Ambulatory Visit: Payer: Self-pay

## 2020-11-23 DIAGNOSIS — I214 Non-ST elevation (NSTEMI) myocardial infarction: Secondary | ICD-10-CM

## 2020-11-23 DIAGNOSIS — I252 Old myocardial infarction: Secondary | ICD-10-CM | POA: Diagnosis not present

## 2020-11-23 DIAGNOSIS — E119 Type 2 diabetes mellitus without complications: Secondary | ICD-10-CM | POA: Diagnosis not present

## 2020-11-23 NOTE — Progress Notes (Signed)
Daily Session Note  Patient Details  Name: Jacob Harrison MRN: [id number] Date of Birth: 11-01-1940 Referring Provider:   Flowsheet Row Cardiac Rehab from 11/08/2020 in [facility] Cardiac and Pulmonary Rehab  Referring Provider End, Harrell Gave MD       Encounter Date: 11/23/2020  Check In:  Session Check In - 11/23/20 0949       Check-In   Supervising physician immediately available to respond to emergencies See telemetry face sheet for immediately available ER MD    Location [facility]-Cardiac & Pulmonary Rehab    Staff Present Renita Papa, RN BSN;Joseph Tessie Fass, RCP,RRT,BSRT;Kelly [place], MPA, RN    Virtual Visit No    Medication changes reported     No    Fall or balance concerns reported    No    Warm-up and Cool-down Performed on first and last piece of equipment    Resistance Training Performed Yes    VAD Patient? No    PAD/SET Patient? No      Pain Assessment   Currently in Pain? No/denies                Social History   Tobacco Use  Smoking Status Never  Smokeless Tobacco Never    Goals Met:  Independence with exercise equipment Exercise tolerated well No report of cardiac concerns or symptoms Strength training completed today  Goals Unmet:  Not Applicable  Comments: Pt able to follow exercise prescription today without complaint.  Will continue to monitor for progression.    Dr. Emily Filbert is Medical Director for [place].  Dr. Ottie Glazier is Medical Director for [facility] Pulmonary Rehabilitation.

## 2020-11-26 ENCOUNTER — Encounter: Payer: Medicare Other | Admitting: *Deleted

## 2020-11-26 ENCOUNTER — Other Ambulatory Visit: Payer: Self-pay

## 2020-11-26 DIAGNOSIS — I214 Non-ST elevation (NSTEMI) myocardial infarction: Secondary | ICD-10-CM

## 2020-11-26 DIAGNOSIS — I252 Old myocardial infarction: Secondary | ICD-10-CM | POA: Diagnosis not present

## 2020-11-26 DIAGNOSIS — E119 Type 2 diabetes mellitus without complications: Secondary | ICD-10-CM | POA: Diagnosis not present

## 2020-11-26 NOTE — Progress Notes (Signed)
Completed initial RD evaluation 

## 2020-11-26 NOTE — Progress Notes (Signed)
Daily Session Note  Patient Details  Name: Jacob Harrison MRN: [id number] Date of Birth: 09/14/1940 Referring Provider:   Flowsheet Row Cardiac Rehab from 11/08/2020 in [facility] Cardiac and Pulmonary Rehab  Referring Provider End, Harrell Gave MD       Encounter Date: 11/26/2020  Check In:  Session Check In - 11/26/20 1044       Check-In   Supervising physician immediately available to respond to emergencies See telemetry face sheet for immediately available ER MD    Location [facility]-Cardiac & Pulmonary Rehab    Staff Present Renita Papa, RN BSN;Joseph [place], RCP,RRT,BSRT;Kelly [place], MPA, [place]icia [place], BS, ACSM CEP, Exercise Physiologist;Susanne Bice, RN, BSN, CCRP    Virtual Visit No    Medication changes reported     No    Fall or balance concerns reported    No    Warm-up and Cool-down Performed on first and last piece of equipment    Resistance Training Performed Yes    VAD Patient? No    PAD/SET Patient? No      Pain Assessment   Currently in Pain? No/denies                Social History   Tobacco Use  Smoking Status Never  Smokeless Tobacco Never    Goals Met:  Independence with exercise equipment Exercise tolerated well No report of cardiac concerns or symptoms Strength training completed today  Goals Unmet:  Not Applicable  Comments: Pt able to follow exercise prescription today without complaint.  Will continue to monitor for progression.    Dr. Emily Filbert is Medical Director for [place].  Dr. Ottie Glazier is Medical Director for [facility] Pulmonary Rehabilitation.

## 2020-11-27 ENCOUNTER — Ambulatory Visit (INDEPENDENT_AMBULATORY_CARE_PROVIDER_SITE_OTHER): Payer: Medicare Other

## 2020-11-27 ENCOUNTER — Other Ambulatory Visit: Payer: Self-pay

## 2020-11-27 DIAGNOSIS — I5033 Acute on chronic diastolic (congestive) heart failure: Secondary | ICD-10-CM | POA: Diagnosis not present

## 2020-11-27 MED ORDER — PERFLUTREN LIPID MICROSPHERE
1.0000 mL | INTRAVENOUS | Status: AC | PRN
  Administered 2020-11-27: 3 mL via INTRAVENOUS

## 2020-11-28 DIAGNOSIS — E119 Type 2 diabetes mellitus without complications: Secondary | ICD-10-CM | POA: Diagnosis not present

## 2020-11-28 DIAGNOSIS — I214 Non-ST elevation (NSTEMI) myocardial infarction: Secondary | ICD-10-CM

## 2020-11-28 DIAGNOSIS — I252 Old myocardial infarction: Secondary | ICD-10-CM | POA: Diagnosis not present

## 2020-11-28 LAB — ECHOCARDIOGRAM COMPLETE
AR max vel: 1.88 cm2
AV Area VTI: 1.97 cm2
AV Area mean vel: 1.99 cm2
AV Mean grad: 9 mmHg
AV Peak grad: 16.6 mmHg
Ao pk vel: 2.04 m/s
Area-P 1/2: 5.06 cm2
Calc EF: 36.1 %
MV VTI: 2.38 cm2
S' Lateral: 3.8 cm
Single Plane A2C EF: 29.7 %
Single Plane A4C EF: 43.5 %

## 2020-11-28 NOTE — Progress Notes (Signed)
Daily Session Note  Patient Details  Name: Jacob Harrison MRN: [id number] Date of Birth: [birth year] Referring Provider:   Flowsheet Row Cardiac Rehab from 11/08/2020 in [facility] Cardiac and Pulmonary Rehab  Referring Provider End, Harrell Gave MD       Encounter Date: 11/28/2020  Check In:  Session Check In - 11/28/20 1029       Check-In   Supervising physician immediately available to respond to emergencies See telemetry face sheet for immediately available ER MD    Location [facility]-Cardiac & Pulmonary Rehab    Staff Present Birdie Sons, MPA, RN;Joseph Tessie Fass, Lindell Spar, BA, ACSM CEP, Exercise Physiologist;Melissa [place], RDN, LDN    Virtual Visit No    Medication changes reported     No    Fall or balance concerns reported    No    Warm-up and Cool-down Performed on first and last piece of equipment    Resistance Training Performed Yes    VAD Patient? No    PAD/SET Patient? No      Pain Assessment   Currently in Pain? No/denies                Social History   Tobacco Use  Smoking Status Never  Smokeless Tobacco Never    Goals Met:  Independence with exercise equipment Exercise tolerated well No report of cardiac concerns or symptoms Strength training completed today  Goals Unmet:  Not Applicable  Comments: Pt able to follow exercise prescription today without complaint.  Will continue to monitor for progression.    Dr. Emily Filbert is Medical Director for [place].  Dr. Ottie Glazier is Medical Director for [facility] Pulmonary Rehabilitation.

## 2020-11-29 ENCOUNTER — Other Ambulatory Visit: Payer: Self-pay | Admitting: Cardiology

## 2020-11-30 ENCOUNTER — Other Ambulatory Visit: Payer: Self-pay

## 2020-11-30 ENCOUNTER — Telehealth: Payer: Self-pay | Admitting: *Deleted

## 2020-11-30 ENCOUNTER — Encounter: Payer: Medicare Other | Admitting: *Deleted

## 2020-11-30 DIAGNOSIS — I214 Non-ST elevation (NSTEMI) myocardial infarction: Secondary | ICD-10-CM

## 2020-11-30 DIAGNOSIS — E119 Type 2 diabetes mellitus without complications: Secondary | ICD-10-CM | POA: Diagnosis not present

## 2020-11-30 DIAGNOSIS — I252 Old myocardial infarction: Secondary | ICD-10-CM | POA: Diagnosis not present

## 2020-11-30 NOTE — Telephone Encounter (Signed)
Attempted to call pt. No answer. Lmtcb.  

## 2020-11-30 NOTE — Telephone Encounter (Signed)
-----   Message from Nelva Bush, MD sent at 11/29/2020 10:32 AM EDT ----- Please let Jacob Harrison know that his echocardiogram shows that his heart has weakened, likely related to his recent heart attack.  I recommend that he continue his current medications and follow-up as scheduled next month.  He should continue with cardiac rehab and let us know if any issues arise.

## 2020-11-30 NOTE — Progress Notes (Signed)
Daily Session Note  Patient Details  Name: Jacob Harrison MRN: [id number] Date of Birth: Jun 29, 1940 Referring Provider:   Flowsheet Row Cardiac Rehab from 11/08/2020 in [facility] Cardiac and Pulmonary Rehab  Referring Provider End, [place] Gave MD       Encounter Date: 11/30/2020  Check In:  Session Check In - 11/30/20 0941       Check-In   Supervising physician immediately available to respond to emergencies See telemetry face sheet for immediately available ER MD    Location [facility]-Cardiac & Pulmonary Rehab    Staff Present Renita Papa, RN BSN;Joseph [place], RCP,RRT,BSRT;Jessica [place], [place], RCEP, CCRP, CCET    Virtual Visit No    Medication changes reported     No    Fall or balance concerns reported    No    Warm-up and Cool-down Performed on first and last piece of equipment    Resistance Training Performed Yes    VAD Patient? No    PAD/SET Patient? No      Pain Assessment   Currently in Pain? No/denies                Social History   Tobacco Use  Smoking Status Never  Smokeless Tobacco Never    Goals Met:  Independence with exercise equipment Exercise tolerated well No report of concerns or symptoms today Strength training completed today  Goals Unmet:  Not Applicable  Comments: Pt able to follow exercise prescription today without complaint.  Will continue to monitor for progression.    Dr. Emily Filbert is Medical Director for [place].  Dr. Ottie Glazier is Medical Director for [facility] Pulmonary Rehabilitation.

## 2020-12-03 ENCOUNTER — Other Ambulatory Visit: Payer: Self-pay

## 2020-12-03 ENCOUNTER — Encounter: Payer: Medicare Other | Admitting: *Deleted

## 2020-12-03 DIAGNOSIS — I214 Non-ST elevation (NSTEMI) myocardial infarction: Secondary | ICD-10-CM

## 2020-12-03 DIAGNOSIS — E119 Type 2 diabetes mellitus without complications: Secondary | ICD-10-CM | POA: Diagnosis not present

## 2020-12-03 DIAGNOSIS — I252 Old myocardial infarction: Secondary | ICD-10-CM | POA: Diagnosis not present

## 2020-12-03 NOTE — Progress Notes (Signed)
Daily Session Note  Patient Details  Name: Jacob Harrison MRN: [id number] Date of Birth: 1941/01/12 Referring Provider:   Flowsheet Row Cardiac Rehab from 11/08/2020 in [facility] Cardiac and Pulmonary Rehab  Referring Provider End, Harrell Gave MD       Encounter Date: 12/03/2020  Check In:  Session Check In - 12/03/20 1028       Check-In   Supervising physician immediately available to respond to emergencies See telemetry face sheet for immediately available ER MD    Location [facility]-Cardiac & Pulmonary Rehab    Staff Present Renita Papa, RN BSN;Joseph [place], RCP,RRT,BSRT;Kelly [place], [place], ACSM CEP, Exercise Physiologist    Virtual Visit No    Medication changes reported     No    Fall or balance concerns reported    No    Warm-up and Cool-down Performed on first and last piece of equipment    Resistance Training Performed Yes    VAD Patient? No    PAD/SET Patient? No      Pain Assessment   Currently in Pain? No/denies               Exercise Prescription Changes - 12/03/20 1000       Home Exercise Plan   Plans to continue exercise at Home (comment)   walking   Frequency Add 2 additional days to program exercise sessions.   start with 1 day   Initial Home Exercises Provided 12/03/20             Social History   Tobacco Use  Smoking Status Never  Smokeless Tobacco Never    Goals Met:  Independence with exercise equipment Exercise tolerated well No report of concerns or symptoms today Strength training completed today  Goals Unmet:  Not Applicable  Comments: Pt able to follow exercise prescription today without complaint.  Will continue to monitor for progression.    Dr. Emily Filbert is Medical Director for [place].  Dr. Ottie Glazier is Medical Director for [facility] Pulmonary Rehabilitation.

## 2020-12-05 ENCOUNTER — Other Ambulatory Visit: Payer: Self-pay

## 2020-12-05 DIAGNOSIS — E119 Type 2 diabetes mellitus without complications: Secondary | ICD-10-CM | POA: Diagnosis not present

## 2020-12-05 DIAGNOSIS — I252 Old myocardial infarction: Secondary | ICD-10-CM | POA: Diagnosis not present

## 2020-12-05 DIAGNOSIS — I214 Non-ST elevation (NSTEMI) myocardial infarction: Secondary | ICD-10-CM

## 2020-12-05 NOTE — Progress Notes (Signed)
Daily Session Note  Patient Details  Name: Jacob Harrison MRN: [id number] Date of Birth: 1940-08-15 Referring Provider:   Flowsheet Row Cardiac Rehab from 11/08/2020 in [facility] Cardiac and Pulmonary Rehab  Referring Provider End, Harrell Gave MD       Encounter Date: 12/05/2020  Check In:  Session Check In - 12/05/20 0955       Check-In   Supervising physician immediately available to respond to emergencies See telemetry face sheet for immediately available ER MD    Location [facility]-Cardiac & Pulmonary Rehab    Staff Present Birdie Sons, MPA, Elveria Rising, BA, ACSM CEP, Exercise Physiologist;Joseph Tessie Fass, [place]    Virtual Visit No    Medication changes reported     No    Fall or balance concerns reported    No    Warm-up and Cool-down Performed on first and last piece of equipment    Resistance Training Performed Yes    VAD Patient? No    PAD/SET Patient? No      Pain Assessment   Currently in Pain? No/denies                Social History   Tobacco Use  Smoking Status Never  Smokeless Tobacco Never    Goals Met:  Independence with exercise equipment Exercise tolerated well No report of concerns or symptoms today Strength training completed today  Goals Unmet:  Not Applicable  Comments: Pt able to follow exercise prescription today without complaint.  Will continue to monitor for progression.    Dr. Emily Filbert is Medical Director for [place].  Dr. Ottie Glazier is Medical Director for [facility] Pulmonary Rehabilitation.

## 2020-12-05 NOTE — Telephone Encounter (Signed)
Patient returning call to go over results.

## 2020-12-06 NOTE — Telephone Encounter (Signed)
Attempted to call pt again to review results.  No answer. LDM on voicemail with results.  Per DPR ok to leave detailed message.  Asked pt to call back with any questions.  Reminded pt of follow up appt scheduled 12/21/20 at 11:30 AM.

## 2020-12-07 ENCOUNTER — Encounter: Payer: Medicare Other | Attending: Internal Medicine

## 2020-12-07 ENCOUNTER — Other Ambulatory Visit: Payer: Self-pay

## 2020-12-07 DIAGNOSIS — I214 Non-ST elevation (NSTEMI) myocardial infarction: Secondary | ICD-10-CM | POA: Diagnosis not present

## 2020-12-07 NOTE — Progress Notes (Signed)
Daily Session Note  Patient Details  Name: Shriyans Kuenzi MRN: [id number] Date of Birth: 09-Sep-1940 Referring Provider:   Flowsheet Row Cardiac Rehab from 11/08/2020 in [facility] Cardiac and Pulmonary Rehab  Referring Provider End, [place]rell Gave MD       Encounter Date: 12/07/2020  Check In:  Session Check In - 12/07/20 1002       Check-In   Supervising physician immediately available to respond to emergencies See telemetry face sheet for immediately available ER MD    Location [facility]-Cardiac & Pulmonary Rehab    Staff Present Birdie Sons, MPA, RN;Jessica [place], MA, RCEP, CCRP, CCET;Joseph [place], Cristopher Estimable, RN BSN    Virtual Visit No    Medication changes reported     No    Fall or balance concerns reported    No    Warm-up and Cool-down Performed on first and last piece of equipment    Resistance Training Performed Yes    VAD Patient? No    PAD/SET Patient? No      Pain Assessment   Currently in Pain? No/denies                Social History   Tobacco Use  Smoking Status Never  Smokeless Tobacco Never    Goals Met:  Independence with exercise equipment Exercise tolerated well No report of concerns or symptoms today Strength training completed today  Goals Unmet:  Not Applicable  Comments: Pt able to follow exercise prescription today without complaint.  Will continue to monitor for progression.    Dr. Emily Filbert is Medical Director for [place].  Dr. Ottie Glazier is Medical Director for [facility] Pulmonary Rehabilitation.

## 2020-12-12 ENCOUNTER — Encounter: Payer: Medicare Other | Admitting: *Deleted

## 2020-12-12 ENCOUNTER — Other Ambulatory Visit: Payer: Self-pay | Admitting: Internal Medicine

## 2020-12-12 ENCOUNTER — Other Ambulatory Visit: Payer: Self-pay

## 2020-12-12 ENCOUNTER — Encounter: Payer: Self-pay | Admitting: *Deleted

## 2020-12-12 DIAGNOSIS — I214 Non-ST elevation (NSTEMI) myocardial infarction: Secondary | ICD-10-CM | POA: Diagnosis not present

## 2020-12-12 DIAGNOSIS — I25118 Atherosclerotic heart disease of native coronary artery with other forms of angina pectoris: Secondary | ICD-10-CM

## 2020-12-12 NOTE — Progress Notes (Signed)
Cardiac Individual Treatment Plan  Patient Details  Name: Jacob Harrison MRN: [id number] Date of Birth: Feb 03, 1941 Referring Provider:   Flowsheet Row Cardiac Rehab from 11/08/2020 in [facility] Cardiac and Pulmonary Rehab  Referring Provider End, Harrell Gave MD       Initial Encounter Date:  Flowsheet Row Cardiac Rehab from 11/08/2020 in [facility] Cardiac and Pulmonary Rehab  Date 11/08/20       Visit Diagnosis: NSTEMI (non-ST elevation myocardial infarction) [facility])  Patient's Home Medications on Admission:  Current Outpatient Medications:    acetaminophen (TYLENOL) 500 MG tablet, Take 1,000 mg by mouth every 6 (six) hours as needed for moderate pain or headache., Disp: , Rfl:    aspirin EC 81 MG tablet, Take 1 tablet (81 mg total) by mouth daily., Disp: , Rfl:    carvedilol (COREG) 12.5 MG tablet, Take 1 tablet (12.5 mg total) by mouth 2 (two) times daily with a meal., Disp: 60 tablet, Rfl: 2   Cholecalciferol (VITAMIN D-3 PO), Take 800 Units by mouth daily., Disp: , Rfl:    clopidogrel (PLAVIX) 75 MG tablet, Take 1 tablet (75 mg total) by mouth daily., Disp: 90 tablet, Rfl: 1   Coenzyme Q10 (CO Q-10) 200 MG CAPS, Take 200 mg by mouth daily. , Disp: , Rfl:    cyanocobalamin 500 MCG tablet, Take 500 mcg by mouth daily., Disp: , Rfl:    diclofenac Sodium (VOLTAREN) 1 % GEL, Apply 2 g topically 2 (two) times daily as needed., Disp: 120 g, Rfl: 0   ezetimibe (ZETIA) 10 MG tablet, TAKE 1 TABLET BY MOUTH EVERY DAY, Disp: 90 tablet, Rfl: 0   finasteride (PROSCAR) 5 MG tablet, TAKE 1 TABLET BY MOUTH EVERY DAY, Disp: 90 tablet, Rfl: 0   furosemide (LASIX) 40 MG tablet, TAKE 1 TABLET BY MOUTH TWICE A DAY, Disp: 60 tablet, Rfl: 1   Glucosamine-Chondroit-Vit C-Mn (GLUCOSAMINE 1500 COMPLEX) CAPS, Take 1 capsule by mouth daily., Disp: , Rfl:    isosorbide mononitrate (IMDUR) 30 MG 24 hr tablet, TAKE 0.5 TABLETS (15 MG TOTAL) BY MOUTH AT BEDTIME., Disp: 15 tablet, Rfl: 0   Lancets (ONETOUCH ULTRASOFT)  lancets, Use to check blood sugars fasting daily, Disp: 100 each, Rfl: 12   metFORMIN (GLUCOPHAGE) 500 MG tablet, 1/2 tablet by mouth daily with food, Disp: 90 tablet, Rfl: 2   Multiple Vitamins-Minerals (PRESERVISION AREDS 2 PO), Take 1 capsule by mouth 2 (two) times a day., Disp: , Rfl:    nitroGLYCERIN (NITROSTAT) 0.4 MG SL tablet, Place 1 tablet (0.4 mg total) under the tongue every 5 (five) minutes as needed., Disp: 25 tablet, Rfl: 3   ranolazine (RANEXA) 1000 MG SR tablet, Take 1 tablet by mouth twice daily, Disp: 180 tablet, Rfl: 3   rosuvastatin (CRESTOR) 20 MG tablet, TAKE 1 TABLET BY MOUTH EVERYDAY AT BEDTIME, Disp: 90 tablet, Rfl: 0  Past Medical History: Past Medical History:  Diagnosis Date   (HFpEF) heart failure with preserved ejection fraction ([place])    a. 10/2016 Echo: EF 65-70%, mild LVH, mildly dil LA. RV fxn nl.   Arthritis    BPH (benign prostatic hyperplasia)    Coronary artery disease    a. 2001 CABG x 3 [facility]): LIMA->LAD, VG->LCX, VG->RPDA; b. 04/05/2010 Cath ([place]): LM 30m LAD 1053mLCX 4053mCA 100p, LIMA->LAD atretic, VG->LCX 100, VG->RCA 80ost, 70d; c. 04/2010 Redo CABG x 2 ([place]): VG->LAD, VG->RPDA; d. 10/2012 Cath ([facility] 20-30, LAD 100/95-7m53mX 40-77m,66m 100p, 80-100d, RPDA 99, RPL1 95, VG->LAD ok,  VG->RPDA ok, EF 67%; e. 11/2016 MV: EF 48%, No ischemia.   Diabetes mellitus without complication ([place])    Type II   Hyperlipidemia    Hypertension    Scoliosis    Spinal stenosis     Tobacco Use: Social History   Tobacco Use  Smoking Status Never  Smokeless Tobacco Never    Labs: Recent Review Flowsheet Data     Labs for ITP Cardiac and Pulmonary Rehab Latest Ref Rng & Units 02/22/2019 05/17/2019 08/15/2019 04/16/2020 08/31/2020   Cholestrol 100 - 199 mg/dL - - 153 160 140   LDLCALC 0 - 99 mg/dL - - 71 69 63   HDL >39 mg/dL - - 67 69 50   Trlycerides 0 - 149 mg/dL - - 76 129 156(H)   Hemoglobin A1c 4.8 - 5.6 % 5.8(A) 5.8(A) 5.5 6.1(H) 6.1(H)         Exercise Target Goals: Exercise Program Goal: Individual exercise prescription set using results from initial 6 min walk test and THRR while considering  patient's activity barriers and safety.   Exercise Prescription Goal: Initial exercise prescription builds to 30-45 minutes a day of aerobic activity, 2-3 days per week.  Home exercise guidelines will be given to patient during program as part of exercise prescription that the participant will acknowledge.   Education: Aerobic Exercise: - Group verbal and visual presentation on the components of exercise prescription. Introduces F.I.T.T principle from ACSM for exercise prescriptions.  Reviews F.I.T.T. principles of aerobic exercise including progression. Written material given at graduation. Flowsheet Row Cardiac Rehab from 12/05/2020 in [facility] Cardiac and Pulmonary Rehab  Date 11/28/20  Educator [facility]  Instruction Review Code 1- Verbalizes Understanding       Education: Resistance Exercise: - Group verbal and visual presentation on the components of exercise prescription. Introduces F.I.T.T principle from ACSM for exercise prescriptions  Reviews F.I.T.T. principles of resistance exercise including progression. Written material given at graduation. Flowsheet Row Cardiac Rehab from 12/05/2020 in [facility] Cardiac and Pulmonary Rehab  Date 12/05/20  Educator [facility]  Instruction Review Code 1- Verbalizes Understanding        Education: Exercise & Equipment Safety: - Individual verbal instruction and demonstration of equipment use and safety with use of the equipment. Flowsheet Row Cardiac Rehab from 12/05/2020 in [facility] Cardiac and Pulmonary Rehab  Education need identified 11/08/20  Date 11/08/20  Educator [place]  Instruction Review Code 1- Verbalizes Understanding       Education: Exercise Physiology & General Exercise Guidelines: - Group verbal and written instruction with models to review the exercise physiology of the cardiovascular  system and associated critical values. Provides general exercise guidelines with specific guidelines to those with heart or lung disease.  Flowsheet Row Cardiac Rehab from 12/05/2020 in [facility] Cardiac and Pulmonary Rehab  Date 11/21/20  Educator [facility]  Instruction Review Code 1- Verbalizes Understanding       Education: Flexibility, Balance, Mind/Body Relaxation: - Group verbal and visual presentation with interactive activity on the components of exercise prescription. Introduces F.I.T.T principle from ACSM for exercise prescriptions. Reviews F.I.T.T. principles of flexibility and balance exercise training including progression. Also discusses the mind body connection.  Reviews various relaxation techniques to help reduce and manage stress (i.e. Deep breathing, progressive muscle relaxation, and visualization). Balance handout provided to take home. Written material given at graduation.   Activity Barriers & Risk Stratification:  Activity Barriers & Cardiac Risk Stratification - 11/08/20 1113       Activity Barriers & Cardiac Risk Stratification  Activity Barriers Deconditioning;Muscular Weakness;Other (comment)    Comments Diabetic neuropathy, torn left rotator cuff    Cardiac Risk Stratification High             6 Minute Walk:  6 Minute Walk     Row Name 11/08/20 1119         6 Minute Walk   Phase Initial     Distance 790 feet     Walk Time 6 minutes     # of Rest Breaks 0     MPH 1.49     METS 1.29     RPE 13     Perceived Dyspnea  2     VO2 Peak 4.53     Symptoms Yes (comment)     Comments Slight SOB- resolved with rest     Resting HR 59 bpm     Resting BP 96/62     Resting Oxygen Saturation  97 %     Exercise Oxygen Saturation  during 6 min walk 97 %     Max Ex. HR 85 bpm     Max Ex. BP 112/64     2 Minute Post BP 108/62              Oxygen Initial Assessment:   Oxygen Re-Evaluation:   Oxygen Discharge (Final Oxygen Re-Evaluation):   Initial  Exercise Prescription:  Initial Exercise Prescription - 11/08/20 1100       Date of Initial Exercise RX and Referring Provider   Date 11/08/20    Referring Provider End, Harrell Gave MD      Treadmill   MPH 1.3    Grade 0    Minutes 15    METs 2      Recumbant Bike   Level 1    RPM 60    Watts 10    Minutes 15    METs 1.3      NuStep   Level 1    SPM 80    Minutes 15    METs 1.3      Biostep-RELP   Level 1    SPM 50    Minutes 15    METs 1.3      Prescription Details   Frequency (times per week) 3    Duration Progress to 30 minutes of continuous aerobic without signs/symptoms of physical distress      Intensity   THRR 40-80% of Max Heartrate 92-126    Ratings of Perceived Exertion 11-13    Perceived Dyspnea 0-4      Progression   Progression Continue to progress workloads to maintain intensity without signs/symptoms of physical distress.      Resistance Training   Training Prescription Yes    Weight 3 lb    Reps 10-15             Perform Capillary Blood Glucose checks as needed.  Exercise Prescription Changes:   Exercise Prescription Changes     Row Name 11/08/20 1100 11/20/20 1400 12/03/20 1000         Response to Exercise   Blood Pressure (Admit) 96/92 102/50 98/62     Blood Pressure (Exercise) 112/64 -- 114/66     Blood Pressure (Exit) 108/62 106/64 94/56     Heart Rate (Admit) 59 bpm 70 bpm 61 bpm     Heart Rate (Exercise) 85 bpm 92 bpm 109 bpm     Heart Rate (Exit) 70 bpm 67 bpm 76 bpm     Oxygen  Saturation (Admit) 97 % -- --     Oxygen Saturation (Exercise) 97 % -- --     Oxygen Saturation (Exit) 97 % -- --     Rating of Perceived Exertion (Exercise) _0 Perceived Dyspnea (Exercise) 2 -- --     Symptoms Slight SOB -- none     Comments walk test results third day --     Duration -- Progress to 30 minutes of  aerobic without signs/symptoms of physical distress Continue with 30 min of aerobic exercise without signs/symptoms of  physical distress.     Intensity -- THRR unchanged THRR unchanged           Progression   Progression -- Continue to progress workloads to maintain intensity without signs/symptoms of physical distress. Continue to progress workloads to maintain intensity without signs/symptoms of physical distress.     Average METs -- 2.1 2.39           Resistance Training   Training Prescription -- Yes Yes     Weight -- 3 lb 3 lb     Reps -- 10-15 10-15           Interval Training   Interval Training -- -- No           Treadmill   MPH -- -- 1.3     Grade -- -- 0     Minutes -- -- 15     METs -- -- 2           Recumbant Bike   Level -- -- 1     Watts -- -- 9     Minutes -- -- 15     METs -- -- 2.39           NuStep   Level -- 1 3     SPM -- 80 --     Minutes -- 15 15     METs -- 2 2.6           Biostep-RELP   Level -- 1 2     SPM -- 50 --     Minutes -- 15 15     METs -- 2.2 2           Home Exercise Plan   Plans to continue exercise at -- -- Home (comment)  walking     Frequency -- -- Add 2 additional days to program exercise sessions.  start with 1 day     Initial Home Exercises Provided -- -- 12/03/20              Exercise Comments:   Exercise Comments     Row Name 11/12/20 1012           Exercise Comments First full day of exercise!  Patient was oriented to gym and equipment including functions, settings, policies, and procedures.  Patient's individual exercise prescription and treatment plan were reviewed.  All starting workloads were established based on the results of the 6 minute walk test done at initial orientation visit.  The plan for exercise progression was also introduced and progression will be customized based on patient's performance and goals.                Exercise Goals and Review:   Exercise Goals     Row Name 11/08/20 1127             Exercise Goals   Increase Physical Activity Yes  Intervention Provide advice,  education, support and counseling about physical activity/exercise needs.;Develop an individualized exercise prescription for aerobic and resistive training based on initial evaluation findings, risk stratification, comorbidities and participant's personal goals.       Expected Outcomes Short Term: Attend rehab on a regular basis to increase amount of physical activity.;Long Term: Add in home exercise to make exercise part of routine and to increase amount of physical activity.;Long Term: Exercising regularly at least 3-5 days a week.       Increase Strength and Stamina Yes       Intervention Provide advice, education, support and counseling about physical activity/exercise needs.;Develop an individualized exercise prescription for aerobic and resistive training based on initial evaluation findings, risk stratification, comorbidities and participant's personal goals.       Expected Outcomes Short Term: Increase workloads from initial exercise prescription for resistance, speed, and METs.;Short Term: Perform resistance training exercises routinely during rehab and add in resistance training at home;Long Term: Improve cardiorespiratory fitness, muscular endurance and strength as measured by increased METs and functional capacity (6MWT)       Able to understand and use rate of perceived exertion (RPE) scale Yes       Intervention Provide education and explanation on how to use RPE scale       Expected Outcomes Short Term: Able to use RPE daily in rehab to express subjective intensity level;Long Term:  Able to use RPE to guide intensity level when exercising independently       Able to understand and use Dyspnea scale Yes       Intervention Provide education and explanation on how to use Dyspnea scale       Expected Outcomes Short Term: Able to use Dyspnea scale daily in rehab to express subjective sense of shortness of breath during exertion;Long Term: Able to use Dyspnea scale to guide intensity level when  exercising independently       Knowledge and understanding of Target Heart Rate Range (THRR) Yes       Intervention Provide education and explanation of THRR including how the numbers were predicted and where they are located for reference       Expected Outcomes Short Term: Able to state/look up THRR;Long Term: Able to use THRR to govern intensity when exercising independently;Short Term: Able to use daily as guideline for intensity in rehab       Able to check pulse independently Yes       Intervention Provide education and demonstration on how to check pulse in carotid and radial arteries.;Review the importance of being able to check your own pulse for safety during independent exercise       Expected Outcomes Short Term: Able to explain why pulse checking is important during independent exercise;Long Term: Able to check pulse independently and accurately       Understanding of Exercise Prescription Yes       Intervention Provide education, explanation, and written materials on patient's individual exercise prescription       Expected Outcomes Short Term: Able to explain program exercise prescription;Long Term: Able to explain home exercise prescription to exercise independently                Exercise Goals Re-Evaluation :  Exercise Goals Re-Evaluation     Row Name 11/12/20 1013 11/20/20 1409 12/03/20 1016         Exercise Goal Re-Evaluation   Exercise Goals Review Understanding of Exercise Prescription;Knowledge and understanding of Target Heart Rate Range (  THRR);Able to understand and use Dyspnea scale;Able to understand and use rate of perceived exertion (RPE) scale Increase Physical Activity;Increase Strength and Stamina Increase Physical Activity;Increase Strength and Stamina;Understanding of Exercise Prescription;Able to check pulse independently;Able to understand and use rate of perceived exertion (RPE) scale;Knowledge and understanding of Target Heart Rate Range (THRR)      Comments Reviewed RPE and dyspnea scales, THR and program prescription with pt today.  Pt voiced understanding and was given a copy of goals to take home. Rich is tolerating exercise well.  he has been able to complete assigned workloads.  Staff will monitor progress. Reviewed home exercise with pt today.  Pt plans to walk and stretch for exercise.  Reviewed THR, pulse, RPE, sign and symptoms, pulse oximetery and when to call 911 or MD. Start with 1 day of walking at home.  Also discussed weather considerations and indoor options.  Pt voiced understanding.     Expected Outcomes Short: Use RPE daily to regulate intensity. Long: Follow program prescription in THR. Short:  attend consistently Long:  improve overall stamina Short: Add on 1 day of exercise at home and check HR Long: Continue to increase overall MET level              Discharge Exercise Prescription (Final Exercise Prescription Changes):  Exercise Prescription Changes - 12/03/20 1000       Response to Exercise   Blood Pressure (Admit) 98/62    Blood Pressure (Exercise) 114/66    Blood Pressure (Exit) 94/56    Heart Rate (Admit) 61 bpm    Heart Rate (Exercise) 109 bpm    Heart Rate (Exit) 76 bpm    Rating of Perceived Exertion (Exercise) 15    Symptoms none    Duration Continue with 30 min of aerobic exercise without signs/symptoms of physical distress.    Intensity THRR unchanged      Progression   Progression Continue to progress workloads to maintain intensity without signs/symptoms of physical distress.    Average METs 2.39      Resistance Training   Training Prescription Yes    Weight 3 lb    Reps 10-15      Interval Training   Interval Training No      Treadmill   MPH 1.3    Grade 0    Minutes 15    METs 2      Recumbant Bike   Level 1    Watts 9    Minutes 15    METs 2.39      NuStep   Level 3    Minutes 15    METs 2.6      Biostep-RELP   Level 2    Minutes 15    METs 2      Home Exercise  Plan   Plans to continue exercise at Home (comment)   walking   Frequency Add 2 additional days to program exercise sessions.   start with 1 day   Initial Home Exercises Provided 12/03/20             Nutrition:  Target Goals: Understanding of nutrition guidelines, daily intake of sodium <157m, cholesterol <2014m calories 30% from fat and 7% or less from saturated fats, daily to have 5 or more servings of fruits and vegetables.  Education: All About Nutrition: -Group instruction provided by verbal, written material, interactive activities, discussions, models, and posters to present general guidelines for heart healthy nutrition including fat, fiber, MyPlate, the role of  sodium in heart healthy nutrition, utilization of the nutrition label, and utilization of this knowledge for meal planning. Follow up email sent as well. Written material given at graduation.   Biometrics:  Pre Biometrics - 11/08/20 1113       Pre Biometrics   Height 5' 6.1" (1.679 m)    Weight 176 lb 3.2 oz (79.9 kg)    BMI (Calculated) 28.35    Single Leg Stand 11.6 seconds              Nutrition Therapy Plan and Nutrition Goals:  Nutrition Therapy & Goals - 11/26/20 1048       Nutrition Therapy   Diet heart healthy, low Na, diabetes friendly    Drug/Food Interactions Statins/Certain Fruits    Protein (specify units) 60g    Fiber 30 grams    Whole Grain Foods 3 servings    Saturated Fats 12 max. grams    Fruits and Vegetables 8 servings/day    Sodium 1.5 grams      Personal Nutrition Goals   Nutrition Goal ST: try adding prunes, maintain fluid intake, consult MD regarding low dose magnesium LT: maintain changes made, increase variety of vegetables (eat the rainbow in a week), add heart healthy fats such as nuts/seeds    Comments B: bowl of cereal with fruit or fruit on it's own or egg or plant based sausage. Drinks most of the day, snacks on fruit. L: chicken sandwich or tomato sandiwch (whole  wheat bread- ezekiel bread) or leftovers D: airfries food a lot. piece of meat like lean beef or chicken and greens with brown rice or wheat pasta with tomato sauce. He cut out salt and that was very hard for him. A1C 6.1. He feels he is eating more sugar than he did before - natural sugar from fruit for the most part. They have a garden. He feels his calories has decreased - lost 30 pounds in 5 months. He has been constipated - 2 quarts water, 2 12 oz sodas (diet). he feels the lasix (2 pills is causing his constipation - this is a common side effect). Suggested prunes, maintain fluid with increased fiber, a consulting his doctor regarding a low dose of magnesium which can relax his bowel. Discussed inclusion of heart healthy fats and how to flavor food as he has made so many changes and is struggling with enjoying some of them.Discussed heart healthy eating and diabetes friendly eating.      Intervention Plan   Intervention Prescribe, educate and counsel regarding individualized specific dietary modifications aiming towards targeted core components such as weight, hypertension, lipid management, diabetes, heart failure and other comorbidities.;Nutrition handout(s) given to patient.    Expected Outcomes Short Term Goal: Understand basic principles of dietary content, such as calories, fat, sodium, cholesterol and nutrients.;Short Term Goal: A plan has been developed with personal nutrition goals set during dietitian appointment.;Long Term Goal: Adherence to prescribed nutrition plan.             Nutrition Assessments:  MEDIFICTS Score Key: ?70 Need to make dietary changes  40-70 Heart Healthy Diet ? 40 Therapeutic Level Cholesterol Diet  Flowsheet Row Cardiac Rehab from 11/08/2020 in [facility] Cardiac and Pulmonary Rehab  Picture Your Plate Total Score on Admission 77      Picture Your Plate Scores: <17 Unhealthy dietary pattern with much room for improvement. 41-50 Dietary pattern unlikely to  meet recommendations for good health and room for improvement. 51-60 More healthful dietary pattern, with some  room for improvement.  >60 Healthy dietary pattern, although there may be some specific behaviors that could be improved.    Nutrition Goals Re-Evaluation:   Nutrition Goals Discharge (Final Nutrition Goals Re-Evaluation):   Psychosocial: Target Goals: Acknowledge presence or absence of significant depression and/or stress, maximize coping skills, provide positive support system. Participant is able to verbalize types and ability to use techniques and skills needed for reducing stress and depression.   Education: Stress, Anxiety, and Depression - Group verbal and visual presentation to define topics covered.  Reviews how body is impacted by stress, anxiety, and depression.  Also discusses healthy ways to reduce stress and to treat/manage anxiety and depression.  Written material given at graduation. Flowsheet Row Cardiac Rehab from 12/05/2020 in [facility] Cardiac and Pulmonary Rehab  Date 11/14/20  Educator [facility]  Instruction Review Code 1- Verbalizes Understanding       Education: Sleep Hygiene -Provides group verbal and written instruction about how sleep can affect your health.  Define sleep hygiene, discuss sleep cycles and impact of sleep habits. Review good sleep hygiene tips.    Initial Review & Psychosocial Screening:  Initial Psych Review & Screening - 11/02/20 1013       Initial Review   Current issues with None Identified      Family Dynamics   Good Support System? Yes   Wife of 42 years, Children live in [place].     Barriers   Psychosocial barriers to participate in program There are no identifiable barriers or psychosocial needs.      Screening Interventions   Interventions Encouraged to exercise    Expected Outcomes Short Term goal: Utilizing psychosocial counselor, staff and physician to assist with identification of specific Stressors or current issues  interfering with healing process. Setting desired goal for each stressor or current issue identified.;Long Term Goal: Stressors or current issues are controlled or eliminated.;Short Term goal: Identification and review with participant of any Quality of Life or Depression concerns found by scoring the questionnaire.;Long Term goal: The participant improves quality of Life and PHQ9 Scores as seen by post scores and/or verbalization of changes             Quality of Life Scores:   Quality of Life - 11/08/20 1110       Quality of Life   Select Quality of Life      Quality of Life Scores   Health/Function Pre 16.27 %    Socioeconomic Pre 25 %    Psych/Spiritual Pre 25.57 %    Family Pre 28.5 %    GLOBAL Pre 21.87 %            Scores of 19 and below usually indicate a poorer quality of life in these areas.  A difference of  2-3 points is a clinically meaningful difference.  A difference of 2-3 points in the total score of the Quality of Life Index has been associated with significant improvement in overall quality of life, self-image, physical symptoms, and general health in studies assessing change in quality of life.  PHQ-9: Recent Review Flowsheet Data     Depression screen [facility] 2/9 12/03/2020 11/08/2020 09/04/2020 07/03/2020 04/18/2020   Decreased Interest 1 1 0 0 0   Down, Depressed, Hopeless 0 0 0 0 0   PHQ - 2 Score 1 1 0 0 0   Altered sleeping 0 _0 Tired, decreased energy _1 0   Change in appetite 0 1  0 0 0   Feeling bad or failure about yourself  0 0 0 0 0   Trouble concentrating 1 1 0 0 0   Moving slowly or fidgety/restless _0 0 0   Suicidal thoughts 0 0 0 0 0   PHQ-9 Score _1 Difficult doing work/chores Somewhat difficult Somewhat difficult Somewhat difficult - Not difficult at all      Interpretation of Total Score  Total Score Depression Severity:  1-4 = Minimal depression, 5-9 = Mild depression, 10-14 = Moderate depression, 15-19 =  Moderately severe depression, 20-27 = Severe depression   Psychosocial Evaluation and Intervention:  Psychosocial Evaluation - 11/02/20 1020       Psychosocial Evaluation & Interventions   Interventions Encouraged to exercise with the program and follow exercise prescription    Comments Denice Paradise has no barriers to attending the program. He was in the hospital in bed for 6 days and feels his strength and stamina is decreased. He hopes to see improvement in both by attending the program. He had significant weight loss in the hospital,and has continued with some weight loss;he has a goal of 165 lb. He lives with his wife and has children within 15- 20 minutes. THey are all part of his support team. He should do well with the program.    Expected Outcomes STG: Rich attends all scheduled sessions He will expoereince an  increase in  strength and stamina LTG: Rich will comtinue withhis exercise progression, and continue with his weight loss to his goal of 165 lb.    Continue Psychosocial Services  Follow up required by staff             Psychosocial Re-Evaluation:  Psychosocial Re-Evaluation     [place] Name 12/03/20 1004             Psychosocial Re-Evaluation   Current issues with Current Stress Concerns       Comments Rich has no complaints at this time. He is worried he may not be able to get back to what he used to be able to do such as outdoor activities and yardwork. He is really motivated to get stronger. Sleep is good and his biggest supporter is his  wife. He is not on any medications for depression/anxiety. His PHQ scored a 4. He felt like he has improved with his mood since he has been exercising but still wants to improve his overall physical health.       Expected Outcomes Short: Continue attendance with rehab Long: Utilize exercise for stress management and maintain positive attitude       Interventions Encouraged to attend Cardiac Rehabilitation for the exercise       Continue  Psychosocial Services  Follow up required by staff                Psychosocial Discharge (Final Psychosocial Re-Evaluation):  Psychosocial Re-Evaluation - 12/03/20 1004       Psychosocial Re-Evaluation   Current issues with Current Stress Concerns    Comments Rich has no complaints at this time. He is worried he may not be able to get back to what he used to be able to do such as outdoor activities and yardwork. He is really motivated to get stronger. Sleep is good and his biggest supporter is his  wife. He is not on any medications for depression/anxiety. His PHQ scored a 4. He felt like he has improved with his mood since he  has been exercising but still wants to improve his overall physical health.    Expected Outcomes Short: Continue attendance with rehab Long: Utilize exercise for stress management and maintain positive attitude    Interventions Encouraged to attend Cardiac Rehabilitation for the exercise    Continue Psychosocial Services  Follow up required by staff             Vocational Rehabilitation: Provide vocational rehab assistance to qualifying candidates.   Vocational Rehab Evaluation & Intervention:   Education: Education Goals: Education classes will be provided on a variety of topics geared toward better understanding of heart health and risk factor modification. Participant will state understanding/return demonstration of topics presented as noted by education test scores.  Learning Barriers/Preferences:   General Cardiac Education Topics:  AED/CPR: - Group verbal and written instruction with the use of models to demonstrate the basic use of the AED with the basic ABC's of resuscitation.   Anatomy and Cardiac Procedures: - Group verbal and visual presentation and models provide information about basic cardiac anatomy and function. Reviews the testing methods done to diagnose heart disease and the outcomes of the test results. Describes the treatment  choices: Medical Management, Angioplasty, or Coronary Bypass Surgery for treating various heart conditions including Myocardial Infarction, Angina, Valve Disease, and Cardiac Arrhythmias.  Written material given at graduation. Flowsheet Row Cardiac Rehab from 12/05/2020 in [facility] Cardiac and Pulmonary Rehab  Date 12/05/20  Educator [facility]  Instruction Review Code 1- Verbalizes Understanding       Medication Safety: - Group verbal and visual instruction to review commonly prescribed medications for heart and lung disease. Reviews the medication, class of the drug, and side effects. Includes the steps to properly store meds and maintain the prescription regimen.  Written material given at graduation.   Intimacy: - Group verbal instruction through game format to discuss how heart and lung disease can affect sexual intimacy. Written material given at graduation.. Flowsheet Row Cardiac Rehab from 12/05/2020 in [facility] Cardiac and Pulmonary Rehab  Date 11/28/20  Educator [facility]  Instruction Review Code 1- Verbalizes Understanding       Know Your Numbers and Heart Failure: - Group verbal and visual instruction to discuss disease risk factors for cardiac and pulmonary disease and treatment options.  Reviews associated critical values for Overweight/Obesity, Hypertension, Cholesterol, and Diabetes.  Discusses basics of heart failure: signs/symptoms and treatments.  Introduces Heart Failure Zone chart for action plan for heart failure.  Written material given at graduation.   Infection Prevention: - Provides verbal and written material to individual with discussion of infection control including proper hand washing and proper equipment cleaning during exercise session. Flowsheet Row Cardiac Rehab from 12/05/2020 in [facility] Cardiac and Pulmonary Rehab  Education need identified 11/08/20  Date 11/08/20  Educator [place]  Instruction Review Code 1- Verbalizes Understanding       Falls Prevention: - Provides verbal  and written material to individual with discussion of falls prevention and safety. Flowsheet Row Cardiac Rehab from 12/05/2020 in [facility] Cardiac and Pulmonary Rehab  Education need identified 11/08/20  Date 11/08/20  Educator [place]  Instruction Review Code 1- Verbalizes Understanding       Other: -Provides group and verbal instruction on various topics (see comments)   Knowledge Questionnaire Score:  Knowledge Questionnaire Score - 11/08/20 1112       Knowledge Questionnaire Score   Pre Score 25/26: Angina             Core Components/Risk Factors/Patient Goals at  Admission:  Personal Goals and Risk Factors at Admission - 11/08/20 1127       Core Components/Risk Factors/Patient Goals on Admission    Weight Management Yes;Weight Loss    Intervention Weight Management: Develop a combined nutrition and exercise program designed to reach desired caloric intake, while maintaining appropriate intake of nutrient and fiber, sodium and fats, and appropriate energy expenditure required for the weight goal.;Weight Management: Provide education and appropriate resources to help participant work on and attain dietary goals.    Admit Weight 176 lb (79.8 kg)    Goal Weight: Short Term 171 lb (77.6 kg)    Goal Weight: Long Term 160 lb (72.6 kg)   Goal per patient   Expected Outcomes Short Term: Continue to assess and modify interventions until short term weight is achieved;Weight Loss: Understanding of general recommendations for a balanced deficit meal plan, which promotes 1-2 lb weight loss per week and includes a negative energy balance of [id number] kcal/d;Understanding of distribution of calorie intake throughout the day with the consumption of 4-5 meals/snacks;Understanding recommendations for meals to include 15-35% energy as protein, 25-35% energy from fat, 35-60% energy from carbohydrates, less than 228m of dietary cholesterol, 20-35 gm of total fiber daily    Diabetes Yes    Intervention  Provide education about signs/symptoms and action to take for hypo/hyperglycemia.;Provide education about proper nutrition, including hydration, and aerobic/resistive exercise prescription along with prescribed medications to achieve blood glucose in normal ranges: Fasting glucose 65-99 mg/dL    Expected Outcomes Short Term: Participant verbalizes understanding of the signs/symptoms and immediate care of hyper/hypoglycemia, proper foot care and importance of medication, aerobic/resistive exercise and nutrition plan for blood glucose control.;Long Term: Attainment of HbA1C < 7%.    Hypertension Yes    Intervention Provide education on lifestyle modifcations including regular physical activity/exercise, weight management, moderate sodium restriction and increased consumption of fresh fruit, vegetables, and low fat dairy, alcohol moderation, and smoking cessation.;Monitor prescription use compliance.    Expected Outcomes Short Term: Continued assessment and intervention until BP is < 140/937mHG in hypertensive participants. < 130/8067mG in hypertensive participants with diabetes, heart failure or chronic kidney disease.;Long Term: Maintenance of blood pressure at goal levels.    Lipids Yes    Intervention Provide education and support for participant on nutrition & aerobic/resistive exercise along with prescribed medications to achieve LDL <36m37mDL >40mg64m Expected Outcomes Short Term: Participant states understanding of desired cholesterol values and is compliant with medications prescribed. Participant is following exercise prescription and nutrition guidelines.;Long Term: Cholesterol controlled with medications as prescribed, with individualized exercise RX and with personalized nutrition plan. Value goals: LDL < 36mg,3m > 40 mg.             Education:Diabetes - Individual verbal and written instruction to review signs/symptoms of diabetes, desired ranges of glucose level fasting, after meals  and with exercise. Acknowledge that pre and post exercise glucose checks will be done for 3 sessions at entry of program. FlowshE[facility]31/20[facility]  Education need identified 11/08/20  Date 11/08/20  Educator [place]  [place] Review Code 1- Verbalizes Understanding       Core Components/Risk Factors/Patient Goals Review:   Goals and Risk Factor Review     Row Name 12/03/20 0957             Core Components/Risk Factors/Patient Goals Review   Personal Goals Review Weight Management/Obesity;Hypertension;Lipids;Diabetes  Review Rich's weight has gone down 4 lbs since he started here. He is walking more and eating more proportion sizes. He weighs himself everday and is aware to make sure there is no significant weight gain in a short period of time. His sugars range from 105-110 fasting. He does check his sugars everyday. His last A1C checked last was 6.1%. He has an appointment with PCP late october which he may check his cholesterol at this time. Cardiologist appointment is next month to follow up as he recently had an echo. BP have been stable at rehab and at home. It sometimes run on the lower side- he is aware to stay hydrated and check it if he ever feels symptomatic.       Expected Outcomes Short: Continue checking weight at home Long: Continue to manage lifestyle risk factors                Core Components/Risk Factors/Patient Goals at Discharge (Final Review):   Goals and Risk Factor Review - 12/03/20 0957       Core Components/Risk Factors/Patient Goals Review   Personal Goals Review Weight Management/Obesity;Hypertension;Lipids;Diabetes    Review Rich's weight has gone down 4 lbs since he started here. He is walking more and eating more proportion sizes. He weighs himself everday and is aware to make sure there is no significant weight gain in a short period of time. His sugars range from 105-110 fasting. He does check  his sugars everyday. His last A1C checked last was 6.1%. He has an appointment with PCP late october which he may check his cholesterol at this time. Cardiologist appointment is next month to follow up as he recently had an echo. BP have been stable at rehab and at home. It sometimes run on the lower side- he is aware to stay hydrated and check it if he ever feels symptomatic.    Expected Outcomes Short: Continue checking weight at home Long: Continue to manage lifestyle risk factors             ITP Comments:  ITP Comments     Row Name 11/02/20 1030 11/08/20 1047 11/12/20 1012 11/14/20 0815 11/26/20 1144   ITP Comments Virtual orientation call completed today. he has an appointment on Date: 11/08/2020  for EP eval and gym Orientation.  Documentation of diagnosis can be found in [facility] Date: 10/04/2020 . Completed 6MWT and gym orientation. Initial ITP created and sent for review to Dr. Emily Filbert, Medical Director. First full day of exercise!  Patient was oriented to gym and equipment including functions, settings, policies, and procedures.  Patient's individual exercise prescription and treatment plan were reviewed.  All starting workloads were established based on the results of the 6 minute walk test done at initial orientation visit.  The plan for exercise progression was also introduced and progression will be customized based on patient's performance and goals. 30 Day review completed. Medical Director ITP review done, changes made as directed, and signed approval by Medical Director.    New to program Completed initial RD evaluation    Row Name 12/12/20 0746           ITP Comments 30 Day review completed. Medical Director ITP review done, changes made as directed, and signed approval by Medical Director.                Comments:

## 2020-12-12 NOTE — Progress Notes (Signed)
Daily Session Note  Patient Details  Name: Jacob Harrison MRN: [id number] Date of Birth: [birth year] Referring Provider:   Flowsheet Row Cardiac Rehab from 11/08/2020 in [facility] Cardiac and Pulmonary Rehab  Referring Provider End, Harrell Gave MD       Encounter Date: 12/12/2020  Check In:  Session Check In - 12/12/20 1105       Check-In   Supervising physician immediately available to respond to emergencies See telemetry face sheet for immediately available ER MD    Location [facility]-Cardiac & Pulmonary Rehab    Staff Present Renita Papa, RN BSN;Joseph Macy, RCP,RRT,BSRT;Jessica [place], [place], RCEP, CCRP, CCET    Virtual Visit No    Medication changes reported     No    Fall or balance concerns reported    No    Warm-up and Cool-down Performed on first and last piece of equipment    Resistance Training Performed Yes    VAD Patient? No    PAD/SET Patient? No      Pain Assessment   Currently in Pain? No/denies                Social History   Tobacco Use  Smoking Status Never  Smokeless Tobacco Never    Goals Met:  Independence with exercise equipment Exercise tolerated well No report of concerns or symptoms today Strength training completed today  Goals Unmet:  Not Applicable  Comments: Pt able to follow exercise prescription today without complaint.  Will continue to monitor for progression.    Dr. Emily Filbert is Medical Director for [place].  Dr. Ottie Glazier is Medical Director for [facility] Pulmonary Rehabilitation.

## 2020-12-14 ENCOUNTER — Other Ambulatory Visit: Payer: Self-pay

## 2020-12-14 ENCOUNTER — Encounter: Payer: Medicare Other | Admitting: *Deleted

## 2020-12-14 DIAGNOSIS — I214 Non-ST elevation (NSTEMI) myocardial infarction: Secondary | ICD-10-CM

## 2020-12-14 NOTE — Progress Notes (Signed)
Daily Session Note  Patient Details  Name: Jacob Harrison MRN: [id number] Date of Birth: 12/28/1940 Referring Provider:   Flowsheet Row Cardiac Rehab from 11/08/2020 in [facility] Cardiac and Pulmonary Rehab  Referring Provider End, Harrell Gave MD       Encounter Date: 12/14/2020  Check In:  Session Check In - 12/14/20 0957       Check-In   Supervising physician immediately available to respond to emergencies See telemetry face sheet for immediately available ER MD    Location [facility]-Cardiac & Pulmonary Rehab    Staff Present Renita Papa, RN BSN;Joseph [place], RCP,RRT,BSRT;Jessica [place], [place], RCEP, CCRP, CCET    Virtual Visit No    Medication changes reported     No    Fall or balance concerns reported    No    Warm-up and Cool-down Performed on first and last piece of equipment    Resistance Training Performed Yes    VAD Patient? No    PAD/SET Patient? No      Pain Assessment   Currently in Pain? No/denies                Social History   Tobacco Use  Smoking Status Never  Smokeless Tobacco Never    Goals Met:  Independence with exercise equipment Exercise tolerated well No report of concerns or symptoms today Strength training completed today  Goals Unmet:  Not Applicable  Comments: Pt able to follow exercise prescription today without complaint.  Will continue to monitor for progression.    Dr. Emily Filbert is Medical Director for [place].  Dr. Ottie Glazier is Medical Director for [facility] Pulmonary Rehabilitation.

## 2020-12-17 ENCOUNTER — Other Ambulatory Visit: Payer: Self-pay | Admitting: Internal Medicine

## 2020-12-17 ENCOUNTER — Other Ambulatory Visit: Payer: Self-pay

## 2020-12-17 ENCOUNTER — Encounter: Payer: Medicare Other | Admitting: *Deleted

## 2020-12-17 DIAGNOSIS — I214 Non-ST elevation (NSTEMI) myocardial infarction: Secondary | ICD-10-CM

## 2020-12-17 NOTE — Progress Notes (Signed)
Daily Session Note  Patient Details  Name: Jacob Harrison MRN: [id number] Date of Birth: Aug 22, 1940 Referring Provider:   Flowsheet Row Cardiac Rehab from 11/08/2020 in [facility] Cardiac and Pulmonary Rehab  Referring Provider End, Harrell Gave MD       Encounter Date: 12/17/2020  Check In:  Session Check In - 12/17/20 1046       Check-In   Supervising physician immediately available to respond to emergencies See telemetry face sheet for immediately available ER MD    Location [facility]-Cardiac & Pulmonary Rehab    Staff Present Renita Papa, RN BSN;Joseph [place], RCP,RRT,BSRT;Kelly [place], BS, ACSM CEP, Exercise Physiologist;Kelly Rosalia Hammers, MPA, RN    Virtual Visit No    Medication changes reported     No    Fall or balance concerns reported    No    Warm-up and Cool-down Performed on first and last piece of equipment    Resistance Training Performed Yes    VAD Patient? No    PAD/SET Patient? No      Pain Assessment   Currently in Pain? No/denies                Social History   Tobacco Use  Smoking Status Never  Smokeless Tobacco Never    Goals Met:  Independence with exercise equipment Exercise tolerated well No report of concerns or symptoms today Strength training completed today  Goals Unmet:  Not Applicable  Comments: Pt able to follow exercise prescription today without complaint.  Will continue to monitor for progression.    Dr. Emily Filbert is Medical Director for [place].  Dr. Ottie Glazier is Medical Director for [facility] Pulmonary Rehabilitation.

## 2020-12-18 NOTE — Progress Notes (Signed)
Cardiology Office Note:    Date:  12/23/2020   ID:  Jacob Harrison, DOB 05/09/1940, MRN [id number]  PCP:  [name], PA-C  [facility] Cardiologist:  Nelva Bush, MD  [facility] Electrophysiologist:  None   Referring MD: [name], PA-C   Chief Complaint: 1 month follow-up  History of Present Illness:    Jacob Harrison is a 80 y.o. male with a hx of CAD s/p CABG and redo CABG, DM2, chronic HFpEF, HLD, chronic leg pain who presents for 1 month follow-up.   Seen in November 2020 with bradycardia recommended to cut back on Carvedilol. Due to suboptimal BP control, Amlodipine was increased. When seen May 2021 noted progressive exertional dyspnea. A soft systolic murmur was noted and echocardiogram ordered. He was started on Lasix '20mg'$  daily for pretibial edema.    Echo 09/23/19 with LVEF 60-65%, no RWMA, G1DD, RV normal size and function, moderate AV sclerosis without stenosis.  Reviewed in depth during clinic visit.   The patient had NSTEMI 09/2020 at which time cardiac cath showed LMCA with mild diffuse disease, LAD with moderate diffuse disease and chronic subtotal occlusion of the mid vessel. Distal LAD fills viz left to right collaterals. LCX with moderate mid vessel disease, RCA with CTO, filling L>R collaterals, SVG to RPDA,LAD, and OM with CTO, LIMA to LAD known to be atretic. LVEDP 62mHg. Recommendations for medical therapy.   Last seen 11/14/20 and denied angina. BP Was soft. Recommended discontinuation of Imdur if EF was low on Echo. Echo showed LVEF 30-35%.  Today, the patient reports no further chest pain. He has some shortness of breath, however it's very rare. It occurs if he tries to do too much after he eats. He has used 4 NTG for chest tightness and SOB. Lower leg swelling improved with lasix.  No orthopnea or pnd. He was previously on '40mg'$  daily. Weight has been going down, not trying to lose weight, but doesn't have the appetite he used to  have. He is trying to stay on low salt diet. He has been doing cardiac rehab and he has been doing it 30-45 minutes a day, and tolerating it well.    Past Medical History:  Diagnosis Date   (HFpEF) heart failure with preserved ejection fraction ([place]    a. 10/2016 Echo: EF 65-70%, mild LVH, mildly dil LA. RV fxn nl.   Arthritis    BPH (benign prostatic hyperplasia)    Coronary artery disease    a. 2001 CABG x 3 ([facility]: LIMA->LAD, VG->LCX, VG->RPDA; b. 04/05/2010 Cath ([place]): LM 390mLAD 10053mCX 74m61mA 100p, LIMA->LAD atretic, VG->LCX 100, VG->RCA 80ost, 70d; c. 04/2010 Redo CABG x 2 ([place]): VG->LAD, VG->RPDA; d. 10/2012 Cath ([facility] 20-30, LAD 100/95-20m,66m 40-14m, 70m100p, 80-100d, RPDA 99, RPL1 95, VG->LAD ok, VG->RPDA ok, EF 67%; e. 11/2016 MV: EF 48%, No ischemia.   Diabetes mellitus without complication (HCC)  [place]ype II   Hyperlipidemia    Hypertension    Scoliosis    Spinal stenosis     Past Surgical History:  Procedure Laterality Date   CARDIAC CATHETERIZATION     CORONARY ARTERY BYPASS GRAFT     x 2 (2001 and redo in late 2011 or early 2012)   HERNIA REPAIR Left    inguinal   LEFT HEART CATH AND CORS/GRAFTS ANGIOGRAPHY N/A 10/04/2020   Procedure: LEFT HEART CATH AND CORS/GRAFTS ANGIOGRAPHY;  Surgeon: HardinLeonie Man [facility];  Service: Cardiovascular;  Laterality: N/A;   LUMBAR LAMINECTOMY/DECOMPRESSION MICRODISCECTOMY N/A 10/20/2018   Procedure: L3-4, L4-5 LUMBAR DECOMPRESSION with dural repair.;  Surgeon: Marybelle Killings, MD;  Location: [place];  Service: Orthopedics;  Laterality: N/A;   TONSILLECTOMY AND ADENOIDECTOMY     TRANSURETHRAL RESECTION OF PROSTATE  1990    Current Medications: Current Meds  Medication Sig   acetaminophen (TYLENOL) 500 MG tablet Take 1,000 mg by mouth every 6 (six) hours as needed for moderate pain or headache.   aspirin EC 81 MG tablet Take 1 tablet (81 mg total) by mouth daily.   carvedilol (COREG)  12.5 MG tablet Take 1 tablet (12.5 mg total) by mouth 2 (two) times daily with a meal.   Cholecalciferol (VITAMIN D-3 PO) Take 800 Units by mouth daily.   clopidogrel (PLAVIX) 75 MG tablet Take 1 tablet (75 mg total) by mouth daily.   Coenzyme Q10 (CO Q-10) 200 MG CAPS Take 200 mg by mouth daily.    cyanocobalamin 500 MCG tablet Take 500 mcg by mouth daily.   diclofenac Sodium (VOLTAREN) 1 % GEL Apply 2 g topically 2 (two) times daily as needed.   ezetimibe (ZETIA) 10 MG tablet TAKE 1 TABLET BY MOUTH EVERY DAY   finasteride (PROSCAR) 5 MG tablet TAKE 1 TABLET BY MOUTH EVERY DAY   furosemide (LASIX) 40 MG tablet TAKE 1 TABLET BY MOUTH TWICE A DAY   Glucosamine-Chondroit-Vit C-Mn (GLUCOSAMINE 1500 COMPLEX) CAPS Take 1 capsule by mouth daily.   Lancets (ONETOUCH ULTRASOFT) lancets Use to check blood sugars fasting daily   metFORMIN (GLUCOPHAGE) 500 MG tablet 1/2 tablet by mouth daily with food   Multiple Vitamins-Minerals (PRESERVISION AREDS 2 PO) Take 1 capsule by mouth 2 (two) times a day.   nitroGLYCERIN (NITROSTAT) 0.4 MG SL tablet Place 1 tablet (0.4 mg total) under the tongue every 5 (five) minutes as needed.   ranolazine (RANEXA) 1000 MG SR tablet TAKE 1 TABLET BY MOUTH TWICE A DAY   rosuvastatin (CRESTOR) 20 MG tablet TAKE 1 TABLET BY MOUTH EVERYDAY AT BEDTIME   [DISCONTINUED] isosorbide mononitrate (IMDUR) 30 MG 24 hr tablet TAKE 0.5 TABLETS (15 MG TOTAL) BY MOUTH AT BEDTIME.     Allergies:   Other and Tetracyclines & related   Social History   Socioeconomic History   Marital status: Married    Spouse name: Not on file   Number of children: Not on file   Years of education: Not on file   Highest education level: Not on file  Occupational History   Occupation: retired  Tobacco Use   Smoking status: Never   Smokeless tobacco: Never  Vaping Use   Vaping Use: Never used  Substance and Sexual Activity   Alcohol use: Not Currently    Alcohol/week: 11.0 standard drinks    Types:  5 Glasses of wine, 6 Cans of beer per week    Comment: 5-7 drinks a week (was 10-15 drinks/week)   Drug use: No   Sexual activity: Not Currently    Birth control/protection: None  Other Topics Concern   Not on file  Social History Narrative   Not on file   Social Determinants of Health   Financial Resource Strain: Not on file  Food Insecurity: Not on file  Transportation Needs: Not on file  Physical Activity: Not on file  Stress: Not on file  Social Connections: Not on file     Family History: The patient's family history includes Alcohol abuse in his maternal  uncle; Cancer in his father and paternal uncle; Diabetes in his maternal uncle and mother; Hyperlipidemia in his maternal uncle and mother.  ROS:   Please see the history of present illness.     All other systems reviewed and are negative.  EKGs/Labs/Other Studies Reviewed:    The following studies were reviewed today:  Echo 11/27/20 1. Left ventricular ejection fraction, by estimation, is 30 to 35%. The  left ventricle has moderately decreased function. The left ventricle  demonstrates regional wall motion abnormalities (see scoring  diagram/findings for description). There is mild  left ventricular hypertrophy. Left ventricular diastolic parameters are  consistent with Grade III diastolic dysfunction (restrictive). Elevated  left atrial pressure.   2. Right ventricular systolic function is mildly reduced. The right  ventricular size is normal. Tricuspid regurgitation signal is inadequate  for assessing PA pressure.   3. Left atrial size was mildly dilated.   4. The mitral valve is degenerative. Mild to moderate mitral valve  regurgitation. No evidence of mitral stenosis.   5. The aortic valve is tricuspid. There is mild calcification of the  aortic valve. There is mild thickening of the aortic valve. Aortic valve  regurgitation is not visualized. Mild aortic valve stenosis. Aortic valve  area, by VTI measures  1.97 cm.  Aortic valve mean gradient measures 9.0 mmHg.   6. The inferior vena cava is normal in size with greater than 50%  respiratory variability, suggesting right atrial pressure of 3 mmHg.  Cardiac cath 10/04/20 -----SEVERE ~ 3 VESSEL NATIVE CAD------ Mid LAD to Dist LAD lesion is 99% stenosed. Mid Cx lesion is 40% stenosed with 40% stenosed side branch in LPAV. Prox RCA to Dist RCA lesion is 100% stenosed. ---- GRAFTS----- SVG-RPDA graft was visualized by angiography. Prox Graft to Insertion lesion is 100% stenosed. SVG-LAD graft was visualized by angiography. Origin to Prox Graft lesion is 100% stenosed. (flush occluded) Unable to Visualize old Grafts (original SVG-rPDA & SVG-LCx). 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 ------ LV end diastolic pressure is moderately elevated. There is no aortic valve stenosis.   SUMMARY Severe native CAD: Long left main the trifurcates into LAD, LCx and RI. The LAD is subtotally occluded after major septal perforator (that provides collaterals to the PDA).  After the septal perforator the vessel essentially occluded yet still gives rise to a diagonal branch and 2 more septal perforators.  There are bridging collaterals filling the distal vessel with faint late filling.  The diagonal branch is small in caliber, but provides collaterals to the distal ramus as well as bridging collaterals to the LAD.  (The 2 small septal perforators do provide collaterals to the PDA) Moderate caliber RI is occluded proximally LCx is roughly 40% mid stenosis at bifurcation into major lateral OM and AV groove branch.  Both branches provide collaterals to the RPL system. RCA 100% proximal occluded.   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 There are ring markers indicating likely the new grafts:  New SVG-RPDA 100% proximal occlusion.  There is roughly 20 mm of dye hang up indicating  relatively recent occlusion-likely culprit lesion.  Not likely to be salvaged.  Given the extent of collateral flow to the distal RCA system, likely competitive flow would keep the graft closed. Subacute to chronic flush occlusion of the new SVG-LAD.   Moderate to Severely Elevated LVEDP c/w Diastolic CHF (with ongoing CP & SOB - Acute  on Chronic)         Glenetta Hew, MD    Antiplatelet/Anticoag Recommend Aspirin '81mg'$  daily for moderate CAD. At a minimum aspirin, but would also be reasonable to use Plavix alone prophylactically.  Discharge Date Anticipated discharge date to be determined. Initial plan to discharge canceled.  Patient had significant dyspnea with rales and hypoxia upon arrival to the Short Stay Unit.  This was potential anticipated and post-cath orders with Lasix 40 mg ordered and administered.  Patient was placed on oxygen and symptomatically improved after diuresis.  Will admit as outpatient extended recovery for overnight monitoring.  This will also allow [place] to potentially titrate antianginal medications while he is inpatient. Unlikely that he would be candidate for a third CABG.    Coronary Diagrams  Diagnostic Dominance: Right     EKG:  EKG is  ordered today.  The ekg ordered today demonstrates NSR, 72bpm, q waves inf leads, nonspecific T wave changes.   Recent Labs: 08/31/2020: ALT 19; TSH 2.180 10/08/2020: Hemoglobin 12.3; Platelets 202 10/25/2020: BNP 599.9 12/21/2020: BUN 18; Creatinine, Ser 0.83; Potassium 4.4; Sodium 138  Recent Lipid Panel    Component Value Date/Time   CHOL 140 08/31/2020 0848   TRIG 156 (H) 08/31/2020 0848   HDL 50 08/31/2020 0848   CHOLHDL 2.8 08/31/2020 0848   CHOLHDL 2.7 11/14/2016 0911   VLDL 27 11/14/2016 0911   LDLCALC 63 08/31/2020 0848    Physical Exam:    VS:  BP 102/62 (BP Location: Left Arm, Patient Position: Sitting, Cuff Size: Normal)   Pulse 72   Ht '5\' 7"'$  (1.702 m)   Wt 168 lb (76.2 kg)   SpO2 98%   BMI  26.31 kg/m     Wt Readings from Last 3 Encounters:  12/21/20 168 lb (76.2 kg)  11/14/20 173 lb (78.5 kg)  11/08/20 176 lb 3.2 oz (79.9 kg)     GEN:  Well nourished, well developed in no acute distress HEENT: Normal NECK: No JVD; No carotid bruits LYMPHATICS: No lymphadenopathy CARDIAC: RRR, no murmurs, rubs, gallops RESPIRATORY:  Clear to auscultation without rales, wheezing or rhonchi  ABDOMEN: Soft, non-tender, non-distended MUSCULOSKELETAL:  pedal edema; No deformity  SKIN: Warm and dry NEUROLOGIC:  Alert and oriented x 3 PSYCHIATRIC:  Normal affect   ASSESSMENT:    1. Chronic systolic heart failure ([place])   2. Medication management   3. Essential hypertension   4. Coronary artery disease of native artery of native heart with stable angina pectoris ([place])   5. Hyperlipidemia, mixed    PLAN:    In order of problems listed above:  HFrEF Echo showed reduced EF 30-35% in the setting of recent NSTEMI. He was changed to lasix '40mg'$  BID. On exam he appears euvolemic, has chronic pedal edema on the left side. He is on Coreg 12.'5mg'$  BID. Bps soft, limiting GDMT. Stop Imdur for a week and check blood pressure. If he is symptomatically stable will start Jardiance vs Losartan, unsure what bp would handle. Can also consider changing coreg to Toprol. Will check a BMET given lasix. He is working on low salt diet, also encouraged leg elevation and compression socks.   CAD with stable angina Patient has been doing cardiac rehab and feels he is making progress. He has occasional chest tightness and shortness of breath, he has taken SLNTG 4 times. We are stopping Imdur as above and he will monitor symptoms. Continue Aspirin, plavix, statin, BB.  HLD LDL 63 08/2020. Continue statin  and Zetia  Disposition: Follow up in 1 month(s) with MD/APP   Signed, Aoki Wedemeyer Ninfa Meeker, PA-C  12/23/2020 7:42 PM    [facility]

## 2020-12-19 ENCOUNTER — Other Ambulatory Visit: Payer: Self-pay

## 2020-12-19 DIAGNOSIS — I214 Non-ST elevation (NSTEMI) myocardial infarction: Secondary | ICD-10-CM | POA: Diagnosis not present

## 2020-12-19 NOTE — Progress Notes (Signed)
Daily Session Note  Patient Details  Name: Jacob Harrison MRN: [id number] Date of Birth: 11-05-1940 Referring Provider:   Flowsheet Row Cardiac Rehab from 11/08/2020 in [facility] Cardiac and Pulmonary Rehab  Referring Provider End, Harrell Gave MD       Encounter Date: 12/19/2020  Check In:  Session Check In - 12/19/20 0955       Check-In   Supervising physician immediately available to respond to emergencies See telemetry face sheet for immediately available ER MD    Location [facility]-Cardiac & Pulmonary Rehab    Staff Present Birdie Sons, MPA, Elveria Rising, BA, ACSM CEP, Exercise Physiologist;Joseph Tessie Fass, [place]    Virtual Visit No    Medication changes reported     No    Fall or balance concerns reported    No    Warm-up and Cool-down Performed on first and last piece of equipment    Resistance Training Performed Yes    VAD Patient? No    PAD/SET Patient? No      Pain Assessment   Currently in Pain? No/denies                Social History   Tobacco Use  Smoking Status Never  Smokeless Tobacco Never    Goals Met:  Independence with exercise equipment Exercise tolerated well No report of concerns or symptoms today Strength training completed today  Goals Unmet:  Not Applicable  Comments: Pt able to follow exercise prescription today without complaint.  Will continue to monitor for progression.    Dr. Emily Filbert is Medical Director for [place].  Dr. Ottie Glazier is Medical Director for [facility] Pulmonary Rehabilitation.

## 2020-12-21 ENCOUNTER — Encounter: Payer: Medicare Other | Admitting: *Deleted

## 2020-12-21 ENCOUNTER — Other Ambulatory Visit: Payer: Self-pay

## 2020-12-21 ENCOUNTER — Ambulatory Visit: Payer: Medicare Other | Admitting: Medical

## 2020-12-21 ENCOUNTER — Encounter: Payer: Self-pay | Admitting: Medical

## 2020-12-21 VITALS — BP 102/62 | HR 72 | Ht 67.0 in | Wt 168.0 lb

## 2020-12-21 DIAGNOSIS — I25118 Atherosclerotic heart disease of native coronary artery with other forms of angina pectoris: Secondary | ICD-10-CM | POA: Diagnosis not present

## 2020-12-21 DIAGNOSIS — I5022 Chronic systolic (congestive) heart failure: Secondary | ICD-10-CM | POA: Diagnosis not present

## 2020-12-21 DIAGNOSIS — Z79899 Other long term (current) drug therapy: Secondary | ICD-10-CM | POA: Diagnosis not present

## 2020-12-21 DIAGNOSIS — E782 Mixed hyperlipidemia: Secondary | ICD-10-CM | POA: Diagnosis not present

## 2020-12-21 DIAGNOSIS — I10 Essential (primary) hypertension: Secondary | ICD-10-CM

## 2020-12-21 DIAGNOSIS — I214 Non-ST elevation (NSTEMI) myocardial infarction: Secondary | ICD-10-CM

## 2020-12-21 NOTE — Progress Notes (Signed)
Daily Session Note  Patient Details  Name: Jacob Harrison MRN: [id number] Date of Birth: Sep 22, 1940 Referring Provider:   Flowsheet Row Cardiac Rehab from 11/08/2020 in [facility] Cardiac and Pulmonary Rehab  Referring Provider End, Harrell Gave MD       Encounter Date: 12/21/2020  Check In:  Session Check In - 12/21/20 1000       Check-In   Supervising physician immediately available to respond to emergencies See telemetry face sheet for immediately available ER MD    Location [facility]Cardiac & [facility] Rehab    Staff Present Renita Papa, RN BSN;Joseph [place], RCP,RRT,BSRT;Jessica [place], [place], RCEP, CCRP, CCET    Virtual Visit No    Medication changes reported     No    Fall or balance concerns reported    No    Warm-up and Cool-down Performed on first and last piece of equipment    Resistance Training Performed Yes    VAD Patient? No    PAD/SET Patient? No      Pain Assessment   Currently in Pain? No/denies                Social History   Tobacco Use  Smoking Status Never  Smokeless Tobacco Never    Goals Met:  Independence with exercise equipment Exercise tolerated well No report of concerns or symptoms today Strength training completed today  Goals Unmet:  Not Applicable  Comments: Pt able to follow exercise prescription today without complaint.  Will continue to monitor for progression.    Dr. Emily Filbert is Medical Director for [place].  Dr. Ottie Glazier is Medical Director for [facility] Pulmonary Rehabilitation.

## 2020-12-21 NOTE — Patient Instructions (Signed)
Medication Instructions:   Please STOP IMDUR  *If you need a refill on your cardiac medications before your next appointment, please call your pharmacy*   Lab Work: BMET LABS WILL APPEAR ON MYCHART, ABNORMAL RESULTS WILL BE CALLED  Testing/Procedures: None  Follow-Up: At [facility], you and your health needs are our priority.  As part of our continuing mission to provide you with exceptional heart care, we have created designated Provider Care Teams.  These Care Teams include your primary Cardiologist (physician) and Advanced Practice Providers (APPs -  Physician Assistants and Nurse Practitioners) who all work together to provide you with the care you need, when you need it.  Your next appointment:   3-4 weeks  The format for your next appointment:   In Person  Provider:   Cadence Kathlen Mody, PA-C   Other Instructions Please monitor blood pressures and keep a log of your readings.  Call the clinic in 1 weeks with BP readings.   How to use a home blood pressure monitor. Be still. Measure at the same time every day. It's important to take the readings at the same time each day, such as morning and evening. Take reading approximately 1 1/2 to 2 hours after BP medications.   AVOID these things for 30 minutes before checking your blood pressure: Drinking caffeine. Drinking alcohol. Eating. Smoking. Exercising.

## 2020-12-22 LAB — BASIC METABOLIC PANEL
BUN/Creatinine Ratio: 22 (ref 10–24)
BUN: 18 mg/dL (ref 8–27)
CO2: 25 mmol/L (ref 20–29)
Calcium: 10.2 mg/dL (ref 8.6–10.2)
Chloride: 93 mmol/L — ABNORMAL LOW (ref 96–106)
Creatinine, Ser: 0.83 mg/dL (ref 0.76–1.27)
Glucose: 108 mg/dL — ABNORMAL HIGH (ref 65–99)
Potassium: 4.4 mmol/L (ref 3.5–5.2)
Sodium: 138 mmol/L (ref 134–144)
eGFR: 90 mL/min/{1.73_m2} (ref >59)

## 2020-12-24 ENCOUNTER — Encounter: Payer: Medicare Other | Admitting: *Deleted

## 2020-12-24 ENCOUNTER — Other Ambulatory Visit: Payer: Self-pay

## 2020-12-24 DIAGNOSIS — I214 Non-ST elevation (NSTEMI) myocardial infarction: Secondary | ICD-10-CM

## 2020-12-24 NOTE — Progress Notes (Signed)
Daily Session Note  Patient Details  Name: Jacob Harrison MRN: [id number] Date of Birth: [birth year] Referring Provider:   Flowsheet Row Cardiac Rehab from 11/08/2020 in [facility] Cardiac and Pulmonary Rehab  Referring Provider End, Harrell Gave MD       Encounter Date: 12/24/2020  Check In:  Session Check In - 12/24/20 1001       Check-In   Supervising physician immediately available to respond to emergencies See telemetry face sheet for immediately available ER MD    Location [facility]-Cardiac & Pulmonary Rehab    Staff Present Renita Papa, RN BSN;Joseph [place], RCP,RRT,BSRT;Kelly [place], [place], ACSM CEP, Exercise Physiologist    Virtual Visit No    Medication changes reported     No    Fall or balance concerns reported    No    Warm-up and Cool-down Performed on first and last piece of equipment    Resistance Training Performed Yes    VAD Patient? No    PAD/SET Patient? No      Pain Assessment   Currently in Pain? No/denies                Social History   Tobacco Use  Smoking Status Never  Smokeless Tobacco Never    Goals Met:  Independence with exercise equipment Exercise tolerated well No report of concerns or symptoms today Strength training completed today  Goals Unmet:  Not Applicable  Comments: Pt able to follow exercise prescription today without complaint.  Will continue to monitor for progression.    Dr. Emily Filbert is Medical Director for [place].  Dr. Ottie Glazier is Medical Director for  [facility] Pulmonary Rehabilitation.

## 2020-12-26 ENCOUNTER — Other Ambulatory Visit: Payer: Self-pay

## 2020-12-26 DIAGNOSIS — I214 Non-ST elevation (NSTEMI) myocardial infarction: Secondary | ICD-10-CM | POA: Diagnosis not present

## 2020-12-26 NOTE — Progress Notes (Signed)
Daily Session Note  Patient Details  Name: Jacob Harrison MRN: [id number] Date of Birth: 1941/01/15 Referring Provider:   Flowsheet Row Cardiac Rehab from 11/08/2020 in [facility] Cardiac and Pulmonary Rehab  Referring Provider End, Harrell Gave MD       Encounter Date: 12/26/2020  Check In:  Session Check In - 12/26/20 1000       Check-In   Supervising physician immediately available to respond to emergencies See telemetry face sheet for immediately available ER MD    Location [facility]Cardiac & Pulmonary Rehab    Staff Present Birdie Sons, MPA, Elveria Rising, BA, ACSM CEP, Exercise Physiologist;Joseph Tessie Fass, [place]    Virtual Visit No    Medication changes reported     No    Fall or balance concerns reported    No    Warm-up and Cool-down Performed on first and last piece of equipment    Resistance Training Performed Yes    VAD Patient? No    PAD/SET Patient? No      Pain Assessment   Currently in Pain? No/denies                Social History   Tobacco Use  Smoking Status Never  Smokeless Tobacco Never    Goals Met:  Independence with exercise equipment Exercise tolerated well No report of concerns or symptoms today Strength training completed today  Goals Unmet:  Not Applicable  Comments: Pt able to follow exercise prescription today without complaint.  Will continue to monitor for progression.    Dr. Emily Filbert is Medical Director for [place].  Dr. Ottie Glazier is Medical Director for [facility] Pulmonary Rehabilitation.

## 2020-12-28 ENCOUNTER — Telehealth: Payer: Self-pay | Admitting: Medical

## 2020-12-28 ENCOUNTER — Other Ambulatory Visit: Payer: Self-pay

## 2020-12-28 DIAGNOSIS — I214 Non-ST elevation (NSTEMI) myocardial infarction: Secondary | ICD-10-CM | POA: Diagnosis not present

## 2020-12-28 NOTE — Telephone Encounter (Signed)
BP LOG   Date  BP  HR  9/17 96/57  55  9/18 102/59  60  9/19 104/57  63  9/20 101/61  57  9/21 99/56  58  9/22 99/58  60  9/23 101/57  58

## 2020-12-28 NOTE — Progress Notes (Signed)
Daily Session Note  Patient Details  Name: Jacob Harrison MRN: [id number] Date of Birth: 1940-09-17 Referring Provider:   Flowsheet Row Cardiac Rehab from 11/08/2020 in [facility] Cardiac and Pulmonary Rehab  Referring Provider End, Harrell Gave MD       Encounter Date: 12/28/2020  Check In:  Session Check In - 12/28/20 1030       Check-In   Supervising physician immediately available to respond to emergencies See telemetry face sheet for immediately available ER MD    Location [facility]-Cardiac & Pulmonary Rehab    Staff Present Hope Budds, RDN, LDN;Jessica Luan [place], MA, RCEP, CCRP, CCET;Lanyah Spengler, RN, BSN    Virtual Visit No    Medication changes reported     No    Fall or balance concerns reported    No    Warm-up and Cool-down Performed on first and last piece of equipment    Resistance Training Performed Yes    VAD Patient? No    PAD/SET Patient? No      Pain Assessment   Currently in Pain? No/denies                Social History   Tobacco Use  Smoking Status Never  Smokeless Tobacco Never    Goals Met:  Independence with exercise equipment Exercise tolerated well No report of concerns or symptoms today Strength training completed today  Goals Unmet:  Not Applicable  Comments: Pt able to follow exercise prescription today without complaint.  Will continue to monitor for progression.   Dr. Emily Filbert is Medical Director for [place].  Dr. Ottie Glazier is Medical Director for [facility] Pulmonary Rehabilitation.

## 2020-12-31 ENCOUNTER — Encounter: Payer: Medicare Other | Admitting: *Deleted

## 2020-12-31 ENCOUNTER — Other Ambulatory Visit: Payer: Self-pay

## 2020-12-31 DIAGNOSIS — I214 Non-ST elevation (NSTEMI) myocardial infarction: Secondary | ICD-10-CM | POA: Diagnosis not present

## 2020-12-31 NOTE — Telephone Encounter (Signed)
Left voicemail message for patient to call back for review of recommendations.     Furth, Cadence H, PA-C  Adelman, Leonie Man, RN Yesterday (8:06 AM)   Bps noted, do not think he would tolerate Losartan or Jardiance.

## 2020-12-31 NOTE — Telephone Encounter (Signed)
Patient returning call.

## 2020-12-31 NOTE — Progress Notes (Signed)
Daily Session Note  Patient Details  Name: Jacob Harrison MRN: [id number] Date of Birth: 05/17/1940 Referring Provider:   Flowsheet Row Cardiac Rehab from 11/08/2020 in [facility] Cardiac and Pulmonary Rehab  Referring Provider End, Harrell Gave MD       Encounter Date: 12/31/2020  Check In:  Session Check In - 12/31/20 1126       Check-In   Supervising physician immediately available to respond to emergencies See telemetry face sheet for immediately available ER MD    Location [facility]-Cardiac & Pulmonary Rehab    Staff Present Nyoka Cowden, RN, BSN, Jennye Moccasin, MPA, Mauricia Area, BS, ACSM CEP, Exercise Physiologist    Virtual Visit No    Medication changes reported     No    Fall or balance concerns reported    No    Warm-up and Cool-down Performed on first and last piece of equipment    Resistance Training Performed Yes    VAD Patient? No    PAD/SET Patient? No      Pain Assessment   Currently in Pain? No/denies                Social History   Tobacco Use  Smoking Status Never  Smokeless Tobacco Never    Goals Met:  Independence with exercise equipment Exercise tolerated well No report of concerns or symptoms today  Goals Unmet:  Not Applicable  Comments: Pt able to follow exercise prescription today without complaint.  Will continue to monitor for progression.    Dr. Emily Filbert is Medical Director for [place].  Dr. Ottie Glazier is Medical Director for [facility] Pulmonary Rehabilitation.

## 2020-12-31 NOTE — Telephone Encounter (Signed)
Reviewed recommendations by provider and confirmed upcoming appointment. He reports cough which is every morning but reviewed medication list and did not see anything that could cause that. Reviewed that he has upcoming appointment in 2 weeks and see if that continues or check with his primary care provider. He was agreeable with this plan and had no further questions at this time.

## 2021-01-01 ENCOUNTER — Other Ambulatory Visit: Payer: Self-pay | Admitting: Medical

## 2021-01-02 ENCOUNTER — Encounter: Payer: Medicare Other | Admitting: *Deleted

## 2021-01-02 ENCOUNTER — Other Ambulatory Visit: Payer: Self-pay

## 2021-01-02 DIAGNOSIS — I214 Non-ST elevation (NSTEMI) myocardial infarction: Secondary | ICD-10-CM

## 2021-01-02 NOTE — Progress Notes (Signed)
Daily Session Note  Patient Details  Name: Jacob Harrison MRN: [id number] Date of Birth: Apr 03, 1941 Referring Provider:   Flowsheet Row Cardiac Rehab from 11/08/2020 in [facility] Cardiac and Pulmonary Rehab  Referring Provider End, Harrell Gave MD       Encounter Date: 01/02/2021  Check In:  Session Check In - 01/02/21 1121       Check-In   Staff Present Nyoka Cowden, RN, BSN, Kela Millin, BA, ACSM CEP, Exercise Physiologist;Joseph [place], [place]    Virtual Visit No    Medication changes reported     No    Fall or balance concerns reported    No    Tobacco Cessation No Change    Warm-up and Cool-down Performed on first and last piece of equipment    Resistance Training Performed Yes    VAD Patient? No    PAD/SET Patient? No      Pain Assessment   Currently in Pain? No/denies                Social History   Tobacco Use  Smoking Status Never  Smokeless Tobacco Never    Goals Met:  Independence with exercise equipment Exercise tolerated well No report of concerns or symptoms today  Goals Unmet:  Not Applicable  Comments:Pt able to follow exercise prescription today without complaint.  Will continue to monitor for progression.    Dr. Emily Filbert is Medical Director for [place].  Dr. Ottie Glazier is Medical Director for [facility] Pulmonary Rehabilitation.

## 2021-01-03 ENCOUNTER — Other Ambulatory Visit: Payer: Self-pay | Admitting: Cardiology

## 2021-01-04 ENCOUNTER — Other Ambulatory Visit: Payer: Self-pay

## 2021-01-04 ENCOUNTER — Encounter: Payer: Medicare Other | Admitting: *Deleted

## 2021-01-04 VITALS — Ht 66.1 in | Wt 168.6 lb

## 2021-01-04 DIAGNOSIS — I214 Non-ST elevation (NSTEMI) myocardial infarction: Secondary | ICD-10-CM

## 2021-01-04 NOTE — Progress Notes (Signed)
Daily Session Note  Patient Details  Name: Jacob Harrison MRN: [id number] Date of Birth: Nov 18, 1940 Referring Provider:   Flowsheet Row Cardiac Rehab from 11/08/2020 in [facility] Cardiac and Pulmonary Rehab  Referring Provider End, Harrell Gave MD       Encounter Date: 01/04/2021  Check In:  Session Check In - 01/04/21 0957       Check-In   Supervising physician immediately available to respond to emergencies See telemetry face sheet for immediately available ER MD    Location [facility]-Cardiac & Pulmonary Rehab    Staff Present Nyoka Cowden, RN, BSN, Willette Pa, MA, RCEP, CCRP, CCET;Joseph [place], [place]    Virtual Visit No    Medication changes reported     No    Fall or balance concerns reported    No    Warm-up and Cool-down Performed on first and last piece of equipment    Resistance Training Performed Yes    VAD Patient? No    PAD/SET Patient? No      Pain Assessment   Currently in Pain? No/denies                Social History   Tobacco Use  Smoking Status Never  Smokeless Tobacco Never    Goals Met:  Proper associated with RPD/PD & O2 Sat Independence with exercise equipment Exercise tolerated well No report of concerns or symptoms today Strength training completed today  Goals Unmet:  Not Applicable  Comments: Pt able to follow exercise prescription today without complaint.  Will continue to monitor for progression.   [name] Name 11/08/20 1119 01/04/21 0958       6 Minute Walk   Phase Initial Discharge    Distance 790 feet 1000 feet    Distance % Change -- 26.6 %    Distance Feet Change -- 210 ft    Walk Time 6 minutes 6 minutes    # of Rest Breaks 0 0    MPH 1.49 1.89    METS 1.29 1.96    RPE 13 13    Perceived Dyspnea  2 --    VO2 Peak 4.53 6.88    Symptoms Yes (comment) Yes (comment)    Comments Slight SOB- resolved with rest --    Resting HR 59 bpm 62 bpm    Resting BP 96/62 126/68    Resting Oxygen  Saturation  97 % 98 %    Exercise Oxygen Saturation  during 6 min walk 97 % 98 %    Max Ex. HR 85 bpm 93 bpm    Max Ex. BP 112/64 136/72    2 Minute Post BP 108/62 --               Dr. Emily Filbert is Medical Director for [place].  Dr. Ottie Glazier is Medical Director for [facility] Pulmonary Rehabilitation.

## 2021-01-07 ENCOUNTER — Other Ambulatory Visit: Payer: Self-pay

## 2021-01-07 ENCOUNTER — Encounter: Payer: Self-pay | Admitting: Physician Assistant

## 2021-01-07 ENCOUNTER — Ambulatory Visit (INDEPENDENT_AMBULATORY_CARE_PROVIDER_SITE_OTHER): Payer: Medicare Other | Admitting: Physician Assistant

## 2021-01-07 VITALS — BP 100/66 | HR 62 | Temp 98.3°F | Ht 66.0 in | Wt 169.5 lb

## 2021-01-07 DIAGNOSIS — E785 Hyperlipidemia, unspecified: Secondary | ICD-10-CM

## 2021-01-07 DIAGNOSIS — E1159 Type 2 diabetes mellitus with other circulatory complications: Secondary | ICD-10-CM | POA: Diagnosis not present

## 2021-01-07 DIAGNOSIS — R052 Subacute cough: Secondary | ICD-10-CM

## 2021-01-07 DIAGNOSIS — E118 Type 2 diabetes mellitus with unspecified complications: Secondary | ICD-10-CM

## 2021-01-07 DIAGNOSIS — I214 Non-ST elevation (NSTEMI) myocardial infarction: Secondary | ICD-10-CM | POA: Diagnosis not present

## 2021-01-07 DIAGNOSIS — Z23 Encounter for immunization: Secondary | ICD-10-CM | POA: Diagnosis not present

## 2021-01-07 DIAGNOSIS — E1169 Type 2 diabetes mellitus with other specified complication: Secondary | ICD-10-CM

## 2021-01-07 DIAGNOSIS — G629 Polyneuropathy, unspecified: Secondary | ICD-10-CM | POA: Diagnosis not present

## 2021-01-07 DIAGNOSIS — I152 Hypertension secondary to endocrine disorders: Secondary | ICD-10-CM

## 2021-01-07 LAB — POCT GLYCOSYLATED HEMOGLOBIN (HGB A1C): Hemoglobin A1C: 5.3 % (ref 4.0–5.6)

## 2021-01-07 NOTE — Patient Instructions (Signed)

## 2021-01-07 NOTE — Progress Notes (Signed)
Established Patient Office Visit  Subjective:  Patient ID: Jacob Harrison, male    DOB: [birth year]  Age: 80 y.o. MRN: [id number]  CC:  Chief Complaint  Patient presents with   Follow-up   Diabetes   Hypertension   Hyperlipidemia    HPI Jacob Harrison presents for follow up on diabetes mellitus, hypertension and hyperlipidemia. Patient c/o of numbness and tingling sensation of both feet (toes only), mostly in the mornings. Once he is walking around does not notice it as much. Continues to have unsteady balance but doing cardiac rehab and working on some gait exercises as well. Denies falls. States in the mornings has a dry barking cough x 2 weeks and usually subsides by 10:00 am. No shortness of breath, wheezing, runny nose, sore throat, fever, n/v/d or body aches.  Diabetes: Pt denies increased urination or thirst from baseline. Pt reports medication compliance. No hypoglycemic events. Checking glucose at home. FBS range 105-115. PPS 140-160. States quit drinking since June 2022.  HLD: Pt taking medication as directed without issues.  HTN: Pt denies chest pain, palpitations, dizziness or worsening lower extremity swelling. States water pill has helped improve his edema. Taking medication as directed without side effects. Checks BP at home and readings range <110/80. Monitors fluid intake.   Past Medical History:  Diagnosis Date   (HFpEF) heart failure with preserved ejection fraction ([place])    a. 10/2016 Echo: EF 65-70%, mild LVH, mildly dil LA. RV fxn nl.   Arthritis    BPH (benign prostatic hyperplasia)    Coronary artery disease    a. 2001 CABG x 3 [facility]): LIMA->LAD, VG->LCX, VG->RPDA; b. 04/05/2010 Cath ([place]): LM 10m LAD 1035mLCX 4022mCA 100p, LIMA->LAD atretic, VG->LCX 100, VG->RCA 80ost, 70d; c. 04/2010 Redo CABG x 2 ([place]): VG->LAD, VG->RPDA; d. 10/2012 Cath ([facility] [place] 20-30, LAD 100/95-34m51mX 40-60m,1m 100p, 80-100d, RPDA 99, RPL1 95, VG->LAD  ok, VG->RPDA ok, EF 67%; e. 11/2016 MV: EF 48%, No ischemia.   Diabetes mellitus without complication (HCC[place]   Hyperlipidemia    Hypertension    Scoliosis    Spinal stenosis     Past Surgical History:  Procedure Laterality Date   CARDIAC CATHETERIZATION     CORONARY ARTERY BYPASS GRAFT     x 2 (2001 and redo in late 2011 or early 2012)   HERNIA REPAIR Left    inguinal   LEFT HEART CATH AND CORS/GRAFTS ANGIOGRAPHY N/A 10/04/2020   Procedure: LEFT HEART CATH AND CORS/GRAFTS ANGIOGRAPHY;  Surgeon: HardiLeonie Man  [facility];  Service: Cardiovascular;  Laterality: N/A;   LUMBAR LAMINECTOMY/DECOMPRESSION MICRODISCECTOMY N/A 10/20/2018   Procedure: L3-4, L4-5 LUMBAR DECOMPRESSION with dural repair.;  Surgeon: YatesMarybelle Killings  [facility] [place];  Laterality: N/A;   TONSILLECTOMY AND ADENOIDECTOMY     TRANSURETHRAL RESECTION OF PROSTATE  1990    Family History  Problem Relation Age of Onset   Diabetes Mother    Hyperlipidemia Mother    Cancer Father        colon   Alcohol abuse Maternal Uncle    Diabetes Maternal Uncle    Hyperlipidemia Maternal Uncle    Cancer Paternal Uncle        colon    Social History   Socioeconomic History   Marital status: Married    Spouse name: Not on file   Number of children: Not on file   Years  of education: Not on file   Highest education level: Not on file  Occupational History   Occupation: retired  Tobacco Use   Smoking status: Never   Smokeless tobacco: Never  Vaping Use   Vaping Use: Never used  Substance and Sexual Activity   Alcohol use: Not Currently    Alcohol/week: 11.0 standard drinks    Types: 5 Glasses of wine, 6 Cans of beer per week    Comment: 5-7 drinks a week (was 10-15 drinks/week)   Drug use: No   Sexual activity: Not Currently    Birth control/protection: None  Other Topics Concern   Not on file  Social History Narrative   Not on file   Social  Determinants of Health   Financial Resource Strain: Not on file  Food Insecurity: Not on file  Transportation Needs: Not on file  Physical Activity: Not on file  Stress: Not on file  Social Connections: Not on file  Intimate Partner Violence: Not on file    Outpatient Medications Prior to Visit  Medication Sig Dispense Refill   acetaminophen (TYLENOL) 500 MG tablet Take 1,000 mg by mouth every 6 (six) hours as needed for moderate pain or headache.     aspirin EC 81 MG tablet Take 1 tablet (81 mg total) by mouth daily.     carvedilol (COREG) 12.5 MG tablet TAKE 1 TABLET (12.5 MG TOTAL) BY MOUTH 2 (TWO) TIMES DAILY WITH A MEAL. 180 tablet 0   Cholecalciferol (VITAMIN D-3 PO) Take 800 Units by mouth daily.     clopidogrel (PLAVIX) 75 MG tablet Take 1 tablet (75 mg total) by mouth daily. 90 tablet 1   Coenzyme Q10 (CO Q-10) 200 MG CAPS Take 200 mg by mouth daily.      cyanocobalamin 500 MCG tablet Take 500 mcg by mouth daily.     diclofenac Sodium (VOLTAREN) 1 % GEL Apply 2 g topically 2 (two) times daily as needed. 120 g 0   ezetimibe (ZETIA) 10 MG tablet TAKE 1 TABLET BY MOUTH EVERY DAY 90 tablet 0   finasteride (PROSCAR) 5 MG tablet TAKE 1 TABLET BY MOUTH EVERY DAY 90 tablet 0   furosemide (LASIX) 40 MG tablet TAKE 1 TABLET BY MOUTH TWICE A DAY 60 tablet 0   Glucosamine-Chondroit-Vit C-Mn (GLUCOSAMINE 1500 COMPLEX) CAPS Take 1 capsule by mouth daily.     isosorbide mononitrate (IMDUR) 30 MG 24 hr tablet TAKE 0.5 TABLETS (15 MG TOTAL) BY MOUTH AT BEDTIME. 15 tablet 0   Lancets (ONETOUCH ULTRASOFT) lancets Use to check blood sugars fasting daily 100 each 12   metFORMIN (GLUCOPHAGE) 500 MG tablet 1/2 tablet by mouth daily with food 90 tablet 2   Multiple Vitamins-Minerals (PRESERVISION AREDS 2 PO) Take 1 capsule by mouth 2 (two) times a day.     nitroGLYCERIN (NITROSTAT) 0.4 MG SL tablet Place 1 tablet (0.4 mg total) under the tongue every 5 (five) minutes as needed. 25 tablet 3    ranolazine (RANEXA) 1000 MG SR tablet TAKE 1 TABLET BY MOUTH TWICE A DAY 180 tablet 0   rosuvastatin (CRESTOR) 20 MG tablet TAKE 1 TABLET BY MOUTH EVERYDAY AT BEDTIME 90 tablet 0   No facility-administered medications prior to visit.    Allergies  Allergen Reactions   Other Itching and Rash    Steroid injection in 2021 caused rash/itching   Tetracyclines & Related Rash    ROS Review of Systems Review of Systems:  A fourteen system review of systems was  performed and found to be positive as per HPI.   Objective:    Physical Exam General:  Well Developed, well nourished, appropriate for stated age.  Neuro:  Alert and oriented,  extra-ocular muscles intact  HEENT:  Normocephalic, atraumatic, neck supple, Skin:  no gross rash, warm, pink. Cardiac:  RRR, S1 S2 Respiratory: CTA B/L w/o wheezing, crackles or rales, Not using accessory muscles, speaking in full sentences- unlabored. Vascular:  Ext warm, o cyanosis apprec.; cap RF less 2 sec. Trace of pitting edema LLE Psych:  No HI/SI, judgement and insight good, Euthymic mood. Full Affect.  BP 100/66   Pulse 62   Temp 98.3 F (36.8 C)   Ht '5\' 6"'  (1.676 m)   Wt 169 lb 8 oz (76.9 kg)   SpO2 98%   BMI 27.36 kg/m  Wt Readings from Last 3 Encounters:  01/07/21 169 lb 8 oz (76.9 kg)  01/04/21 168 lb 9.6 oz (76.5 kg)  12/21/20 168 lb (76.2 kg)     Health Maintenance Due  Topic Date Due   Hepatitis C Screening  Never done   Zoster Vaccines- Shingrix (1 of 2) Never done   COVID-19 Vaccine (3 - Pfizer risk series) 06/29/2019    There are no preventive care reminders to display for this patient.  Lab Results  Component Value Date   TSH 2.180 08/31/2020   Lab Results  Component Value Date   WBC 17.1 (H) 10/08/2020   HGB 12.3 (L) 10/08/2020   HCT 35.6 (L) 10/08/2020   MCV 90.1 10/08/2020   PLT 202 10/08/2020   Lab Results  Component Value Date   NA 138 12/21/2020   K 4.4 12/21/2020   CO2 25 12/21/2020   GLUCOSE 108  (H) 12/21/2020   BUN 18 12/21/2020   CREATININE 0.83 12/21/2020   BILITOT 0.6 08/31/2020   ALKPHOS 90 08/31/2020   AST 17 08/31/2020   ALT 19 08/31/2020   PROT 6.7 08/31/2020   ALBUMIN 4.9 (H) 08/31/2020   CALCIUM 10.2 12/21/2020   ANIONGAP 10 10/09/2020   EGFR 90 12/21/2020   Lab Results  Component Value Date   CHOL 140 08/31/2020   Lab Results  Component Value Date   HDL 50 08/31/2020   Lab Results  Component Value Date   LDLCALC 63 08/31/2020   Lab Results  Component Value Date   TRIG 156 (H) 08/31/2020   Lab Results  Component Value Date   CHOLHDL 2.8 08/31/2020   Lab Results  Component Value Date   HGBA1C 5.3 01/07/2021      Assessment & Plan:   Problem List Items Addressed This Visit       Cardiovascular and Mediastinum   Non-ST elevation (NSTEMI) myocardial infarction [facility])     Endocrine   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 -On antianginal regimen with isosorbide mononitrate, carvedilol, ranozaline. -Continue cardiac rehab.  Diabetes mellitus with complication: -G2X today improved from 6.1 to 5.3, well controlled. -Continue current medication regimen. -Continue ambulatory glucose monitoring. -Will continue to monitor.  Hypertension associated with diabetes: -Soft blood pressure. -Followed by cardiology. -Recommend to continue monitoring BP/pulse at home.  Hyperlipidemia associated with type 2  diabetes mellitus: -Last lipid panel: total cholesterol 140, triglycerides 156 (mildly elevated), HDL 50, LDL 63 (at goal <70). -Continue current medication regimen. -Will collect CMP to monitor renal function and electrolytes.  Neuropathy: -Discussed with patient LE paresthesia likely related to neuropathy. Patient has been evaluated by neurology. Discussed management options including medication and potential side effects, prefers to monitor symptoms.  Subacute cough: -Reviewed chest x-ray 10/04/2020: IMPRESSION: Mild to moderate diffuse bilateral airspace disease. Favor edema although pneumonia possible. -Will repeat chest x-ray to evaluate for pneumonia. Will collect CBC to evaluate for leukocytosis.     No orders of the defined types were placed in this encounter.   Follow-up: Return in about 3 months (around 04/09/2021) for [facility] and [facility] a few days prior .    [name], PA-C

## 2021-01-08 LAB — COMPREHENSIVE METABOLIC PANEL
ALT: 21 IU/L (ref 0–44)
AST: 40 IU/L (ref 0–40)
Albumin/Globulin Ratio: 1.8 (ref 1.2–2.2)
Albumin: 4.5 g/dL (ref 3.7–4.7)
Alkaline Phosphatase: 81 IU/L (ref 44–121)
BUN/Creatinine Ratio: 19 (ref 10–24)
BUN: 16 mg/dL (ref 8–27)
Bilirubin Total: 0.4 mg/dL (ref 0.0–1.2)
CO2: 20 mmol/L (ref 20–29)
Calcium: 9.9 mg/dL (ref 8.6–10.2)
Chloride: 97 mmol/L (ref 96–106)
Creatinine, Ser: 0.83 mg/dL (ref 0.76–1.27)
Globulin, Total: 2.5 g/dL (ref 1.5–4.5)
Glucose: 139 mg/dL — ABNORMAL HIGH (ref 70–99)
Potassium: 5.4 mmol/L — ABNORMAL HIGH (ref 3.5–5.2)
Sodium: 138 mmol/L (ref 134–144)
Total Protein: 7 g/dL (ref 6.0–8.5)
eGFR: 90 mL/min/{1.73_m2} (ref >59)

## 2021-01-08 LAB — CBC WITH DIFFERENTIAL/PLATELET
Basophils Absolute: 0.1 10*3/uL (ref 0.0–0.2)
Basos: 1 %
EOS (ABSOLUTE): 0.1 10*3/uL (ref 0.0–0.4)
Eos: 1 %
Hematocrit: 42.4 % (ref 37.5–51.0)
Hemoglobin: 13.9 g/dL (ref 13.0–17.7)
Immature Grans (Abs): 0.1 10*3/uL (ref 0.0–0.1)
Immature Granulocytes: 1 %
Lymphocytes Absolute: 3.1 10*3/uL (ref 0.7–3.1)
Lymphs: 26 %
MCH: 31.4 pg (ref 26.6–33.0)
MCHC: 32.8 g/dL (ref 31.5–35.7)
MCV: 96 fL (ref 79–97)
Monocytes Absolute: 1.2 10*3/uL — ABNORMAL HIGH (ref 0.1–0.9)
Monocytes: 11 %
Neutrophils Absolute: 7.1 10*3/uL — ABNORMAL HIGH (ref 1.4–7.0)
Neutrophils: 60 %
Platelets: 270 10*3/uL (ref 150–450)
RBC: 4.42 x10E6/uL (ref 4.14–5.80)
RDW: 14.2 % (ref 11.6–15.4)
WBC: 11.7 10*3/uL — ABNORMAL HIGH (ref 3.4–10.8)

## 2021-01-09 ENCOUNTER — Other Ambulatory Visit: Payer: Self-pay

## 2021-01-09 ENCOUNTER — Ambulatory Visit
Admission: RE | Admit: 2021-01-09 | Discharge: 2021-01-09 | Disposition: A | Discharge status: Home / Self Care | Payer: Medicare Other | Attending: Physician Assistant | Admitting: Physician Assistant

## 2021-01-09 ENCOUNTER — Encounter: Payer: Self-pay | Admitting: *Deleted

## 2021-01-09 ENCOUNTER — Ambulatory Visit
Admission: RE | Admit: 2021-01-09 | Discharge: 2021-01-09 | Disposition: A | Discharge status: Home / Self Care | Payer: Medicare Other | Source: Ambulatory Visit | Attending: Physician Assistant | Admitting: Physician Assistant

## 2021-01-09 ENCOUNTER — Encounter: Payer: Medicare Other | Attending: Internal Medicine

## 2021-01-09 DIAGNOSIS — R059 Cough, unspecified: Secondary | ICD-10-CM | POA: Diagnosis not present

## 2021-01-09 DIAGNOSIS — R052 Subacute cough: Secondary | ICD-10-CM | POA: Insufficient documentation

## 2021-01-09 DIAGNOSIS — I214 Non-ST elevation (NSTEMI) myocardial infarction: Secondary | ICD-10-CM

## 2021-01-09 DIAGNOSIS — I252 Old myocardial infarction: Secondary | ICD-10-CM | POA: Diagnosis not present

## 2021-01-09 DIAGNOSIS — Z5189 Encounter for other specified aftercare: Secondary | ICD-10-CM | POA: Diagnosis not present

## 2021-01-09 NOTE — Progress Notes (Signed)
Daily Session Note  Patient Details  Name: Jacob Harrison MRN: [id number] Date of Birth: [birth year] Referring Provider:   Flowsheet Row Cardiac Rehab from 11/08/2020 in [facility] Cardiac and Pulmonary Rehab  Referring Provider End, Harrell Gave MD       Encounter Date: 01/09/2021  Check In:  Session Check In - 01/09/21 0938       Check-In   Supervising physician immediately available to respond to emergencies See telemetry face sheet for immediately available ER MD    Location [facility]-Cardiac & Pulmonary Rehab    Staff Present Birdie Sons, MPA, RN;Melissa Bozeman, RDN, Rowe Pavy, BA, ACSM CEP, Exercise Physiologist    Virtual Visit No    Medication changes reported     No    Fall or balance concerns reported    No    Tobacco Cessation No Change    Warm-up and Cool-down Performed on first and last piece of equipment    Resistance Training Performed Yes    VAD Patient? No    PAD/SET Patient? No      Pain Assessment   Currently in Pain? No/denies                Social History   Tobacco Use  Smoking Status Never  Smokeless Tobacco Never    Goals Met:  Independence with exercise equipment Exercise tolerated well No report of concerns or symptoms today Strength training completed today  Goals Unmet:  Not Applicable  Comments: Pt able to follow exercise prescription today without complaint.  Will continue to monitor for progression.    Dr. Emily Filbert is Medical Director for [place].  Dr. Ottie Glazier is Medical Director for [facility] Pulmonary Rehabilitation.

## 2021-01-09 NOTE — Progress Notes (Signed)
Cardiac Individual Treatment Plan  Patient Details  Name: Jacob Harrison MRN: [id number] Date of Birth: 01-Mar-1941 Referring Provider:   Flowsheet Row Cardiac Rehab from 11/08/2020 in [facility] Cardiac and Pulmonary Rehab  Referring Provider End, Harrell Gave MD       Initial Encounter Date:  Flowsheet Row Cardiac Rehab from 11/08/2020 in [facility] Cardiac and Pulmonary Rehab  Date 11/08/20       Visit Diagnosis: NSTEMI (non-ST elevation myocardial infarction) [facility])  Patient's Home Medications on Admission:  Current Outpatient Medications:    acetaminophen (TYLENOL) 500 MG tablet, Take 1,000 mg by mouth every 6 (six) hours as needed for moderate pain or headache., Disp: , Rfl:    aspirin EC 81 MG tablet, Take 1 tablet (81 mg total) by mouth daily., Disp: , Rfl:    carvedilol (COREG) 12.5 MG tablet, TAKE 1 TABLET (12.5 MG TOTAL) BY MOUTH 2 (TWO) TIMES DAILY WITH A MEAL., Disp: 180 tablet, Rfl: 0   Cholecalciferol (VITAMIN D-3 PO), Take 800 Units by mouth daily., Disp: , Rfl:    clopidogrel (PLAVIX) 75 MG tablet, Take 1 tablet (75 mg total) by mouth daily., Disp: 90 tablet, Rfl: 1   Coenzyme Q10 (CO Q-10) 200 MG CAPS, Take 200 mg by mouth daily. , Disp: , Rfl:    cyanocobalamin 500 MCG tablet, Take 500 mcg by mouth daily., Disp: , Rfl:    diclofenac Sodium (VOLTAREN) 1 % GEL, Apply 2 g topically 2 (two) times daily as needed., Disp: 120 g, Rfl: 0   ezetimibe (ZETIA) 10 MG tablet, TAKE 1 TABLET BY MOUTH EVERY DAY, Disp: 90 tablet, Rfl: 0   finasteride (PROSCAR) 5 MG tablet, TAKE 1 TABLET BY MOUTH EVERY DAY, Disp: 90 tablet, Rfl: 0   furosemide (LASIX) 40 MG tablet, TAKE 1 TABLET BY MOUTH TWICE A DAY, Disp: 60 tablet, Rfl: 0   Glucosamine-Chondroit-Vit C-Mn (GLUCOSAMINE 1500 COMPLEX) CAPS, Take 1 capsule by mouth daily., Disp: , Rfl:    isosorbide mononitrate (IMDUR) 30 MG 24 hr tablet, TAKE 0.5 TABLETS (15 MG TOTAL) BY MOUTH AT BEDTIME., Disp: 15 tablet, Rfl: 0   Lancets (ONETOUCH  ULTRASOFT) lancets, Use to check blood sugars fasting daily, Disp: 100 each, Rfl: 12   metFORMIN (GLUCOPHAGE) 500 MG tablet, 1/2 tablet by mouth daily with food, Disp: 90 tablet, Rfl: 2   Multiple Vitamins-Minerals (PRESERVISION AREDS 2 PO), Take 1 capsule by mouth 2 (two) times a day., Disp: , Rfl:    nitroGLYCERIN (NITROSTAT) 0.4 MG SL tablet, Place 1 tablet (0.4 mg total) under the tongue every 5 (five) minutes as needed., Disp: 25 tablet, Rfl: 3   ranolazine (RANEXA) 1000 MG SR tablet, TAKE 1 TABLET BY MOUTH TWICE A DAY, Disp: 180 tablet, Rfl: 0   rosuvastatin (CRESTOR) 20 MG tablet, TAKE 1 TABLET BY MOUTH EVERYDAY AT BEDTIME, Disp: 90 tablet, Rfl: 0  Past Medical History: Past Medical History:  Diagnosis Date   (HFpEF) heart failure with preserved ejection fraction ([place])    a. 10/2016 Echo: EF 65-70%, mild LVH, mildly dil LA. RV fxn nl.   Arthritis    BPH (benign prostatic hyperplasia)    Coronary artery disease    a. 2001 CABG x 3 [facility]): LIMA->LAD, VG->LCX, VG->RPDA; b. 04/05/2010 Cath ([place]): LM 73m LAD 1079mLCX 4066mCA 100p, LIMA->LAD atretic, VG->LCX 100, VG->RCA 80ost, 70d; c. 04/2010 Redo CABG x 2 ([place]): VG->LAD, VG->RPDA; d. 10/2012 Cath ([facility] 20-30, LAD 100/95-53m62mX 40-53m,53m 100p, 80-100d, RPDA 99, RPL1 95, VG->LAD  ok, VG->RPDA ok, EF 67%; e. 11/2016 MV: EF 48%, No ischemia.   Diabetes mellitus without complication ([place])    Type II   Hyperlipidemia    Hypertension    Scoliosis    Spinal stenosis     Tobacco Use: Social History   Tobacco Use  Smoking Status Never  Smokeless Tobacco Never    Labs: Recent Review Flowsheet Data     Labs for ITP Cardiac and Pulmonary Rehab Latest Ref Rng & Units 05/17/2019 08/15/2019 04/16/2020 08/31/2020 01/07/2021   Cholestrol 100 - 199 mg/dL - 153 160 140 -   LDLCALC 0 - 99 mg/dL - 71 69 63 -   HDL >39 mg/dL - 67 69 50 -   Trlycerides 0 - 149 mg/dL - 76 129 156(H) -   Hemoglobin A1c 4.0 - 5.6 % 5.8(A) 5.5 6.1(H)  6.1(H) 5.3        Exercise Target Goals: Exercise Program Goal: Individual exercise prescription set using results from initial 6 min walk test and THRR while considering  patient's activity barriers and safety.   Exercise Prescription Goal: Initial exercise prescription builds to 30-45 minutes a day of aerobic activity, 2-3 days per week.  Home exercise guidelines will be given to patient during program as part of exercise prescription that the participant will acknowledge.   Education: Aerobic Exercise: - Group verbal and visual presentation on the components of exercise prescription. Introduces F.I.T.T principle from ACSM for exercise prescriptions.  Reviews F.I.T.T. principles of aerobic exercise including progression. Written material given at graduation. Flowsheet Row Cardiac Rehab from 01/09/2021 in [facility] Cardiac and Pulmonary Rehab  Date 11/28/20  Educator [facility]  Instruction Review Code 1- Verbalizes Understanding       Education: Resistance Exercise: - Group verbal and visual presentation on the components of exercise prescription. Introduces F.I.T.T principle from ACSM for exercise prescriptions  Reviews F.I.T.T. principles of resistance exercise including progression. Written material given at graduation. Flowsheet Row Cardiac Rehab from 01/09/2021 in [facility] Cardiac and Pulmonary Rehab  Date 12/05/20  Educator [facility]  Instruction Review Code 1- Verbalizes Understanding        Education: Exercise & Equipment Safety: - Individual verbal instruction and demonstration of equipment use and safety with use of the equipment. Flowsheet Row Cardiac Rehab from 01/09/2021 in [facility] Cardiac and Pulmonary Rehab  Education need identified 11/08/20  Date 11/08/20  Educator [place]  Instruction Review Code 1- Verbalizes Understanding       Education: Exercise Physiology & General Exercise Guidelines: - Group verbal and written instruction with models to review the exercise physiology of the  cardiovascular system and associated critical values. Provides general exercise guidelines with specific guidelines to those with heart or lung disease.  Flowsheet Row Cardiac Rehab from 01/09/2021 in [facility] Cardiac and Pulmonary Rehab  Date 11/21/20  Educator [facility]  Instruction Review Code 1- Verbalizes Understanding       Education: Flexibility, Balance, Mind/Body Relaxation: - Group verbal and visual presentation with interactive activity on the components of exercise prescription. Introduces F.I.T.T principle from ACSM for exercise prescriptions. Reviews F.I.T.T. principles of flexibility and balance exercise training including progression. Also discusses the mind body connection.  Reviews various relaxation techniques to help reduce and manage stress (i.e. Deep breathing, progressive muscle relaxation, and visualization). Balance handout provided to take home. Written material given at graduation. Flowsheet Row Cardiac Rehab from 01/09/2021 in [facility] Cardiac and Pulmonary Rehab  Date 12/12/20  Educator [facility]  Instruction Review Code 1- Verbalizes Understanding  Activity Barriers & Risk Stratification:  Activity Barriers & Cardiac Risk Stratification - 11/08/20 1113       Activity Barriers & Cardiac Risk Stratification   Activity Barriers Deconditioning;Muscular Weakness;Other (comment)    Comments Diabetic neuropathy, torn left rotator cuff    Cardiac Risk Stratification High             6 Minute Walk:  6 Minute Walk     Row Name 11/08/20 1119 01/04/21 0958       6 Minute Walk   Phase Initial Discharge    Distance 790 feet 1000 feet    Distance % Change -- 26.6 %    Distance Feet Change -- 210 ft    Walk Time 6 minutes 6 minutes    # of Rest Breaks 0 0    MPH 1.49 1.89    METS 1.29 1.96    RPE 13 13    Perceived Dyspnea  2 --    VO2 Peak 4.53 6.88    Symptoms Yes (comment) Yes (comment)    Comments Slight SOB- resolved with rest --    Resting HR 59 bpm 62 bpm     Resting BP 96/62 126/68    Resting Oxygen Saturation  97 % 98 %    Exercise Oxygen Saturation  during 6 min walk 97 % 98 %    Max Ex. HR 85 bpm 93 bpm    Max Ex. BP 112/64 136/72    2 Minute Post BP 108/62 --             Oxygen Initial Assessment:   Oxygen Re-Evaluation:   Oxygen Discharge (Final Oxygen Re-Evaluation):   Initial Exercise Prescription:  Initial Exercise Prescription - 11/08/20 1100       Date of Initial Exercise RX and Referring Provider   Date 11/08/20    Referring Provider End, Harrell Gave MD      Treadmill   MPH 1.3    Grade 0    Minutes 15    METs 2      Recumbant Bike   Level 1    RPM 60    Watts 10    Minutes 15    METs 1.3      NuStep   Level 1    SPM 80    Minutes 15    METs 1.3      Biostep-RELP   Level 1    SPM 50    Minutes 15    METs 1.3      Prescription Details   Frequency (times per week) 3    Duration Progress to 30 minutes of continuous aerobic without signs/symptoms of physical distress      Intensity   THRR 40-80% of Max Heartrate 92-126    Ratings of Perceived Exertion 11-13    Perceived Dyspnea 0-4      Progression   Progression Continue to progress workloads to maintain intensity without signs/symptoms of physical distress.      Resistance Training   Training Prescription Yes    Weight 3 lb    Reps 10-15             Perform Capillary Blood Glucose checks as needed.  Exercise Prescription Changes:   Exercise Prescription Changes     Row Name 11/08/20 1100 11/20/20 1400 12/03/20 1000 12/17/20 0800 12/31/20 1600     Response to Exercise   Blood Pressure (Admit) 96/92 102/50 98/62 128/62 112/60   Blood Pressure (Exercise) 112/64 -- 114/66  132/60 --   Blood Pressure (Exit) 108/62 106/64 94/56 122/62 116/64   Heart Rate (Admit) 59 bpm 70 bpm 61 bpm 57 bpm 61 bpm   Heart Rate (Exercise) 85 bpm 92 bpm 109 bpm 106 bpm 90 bpm   Heart Rate (Exit) 70 bpm 67 bpm 76 bpm 65 bpm 84 bpm   Oxygen  Saturation (Admit) 97 % -- -- -- 95 %   Oxygen Saturation (Exercise) 97 % -- -- -- 94 %   Oxygen Saturation (Exit) 97 % -- -- -- 94 %   Rating of Perceived Exertion (Exercise) '13 11 15 15 13   ' Perceived Dyspnea (Exercise) 2 -- -- -- 1   Symptoms Slight SOB -- none none none   Comments walk test results third day -- -- --   Duration -- Progress to 30 minutes of  aerobic without signs/symptoms of physical distress Continue with 30 min of aerobic exercise without signs/symptoms of physical distress. Continue with 30 min of aerobic exercise without signs/symptoms of physical distress. Continue with 30 min of aerobic exercise without signs/symptoms of physical distress.   Intensity -- THRR unchanged THRR unchanged THRR unchanged THRR unchanged     Progression   Progression -- Continue to progress workloads to maintain intensity without signs/symptoms of physical distress. Continue to progress workloads to maintain intensity without signs/symptoms of physical distress. Continue to progress workloads to maintain intensity without signs/symptoms of physical distress. Continue to progress workloads to maintain intensity without signs/symptoms of physical distress.   Average METs -- 2.1 2.39 2.3 2.25     Resistance Training   Training Prescription -- Yes Yes Yes Yes   Weight -- 3 lb 3 lb 3 lb 3 lb   Reps -- 10-15 10-15 10-15 10-15     Interval Training   Interval Training -- -- No No No     Treadmill   MPH -- -- 1.3 1.3 1.5   Grade -- -- 0 0 0   Minutes -- -- '15 15 15   ' METs -- -- 2 2 2.15     Recumbant Bike   Level -- -- '1 2 3   ' Watts -- -- 9 15 --   Minutes -- -- '15 15 15   ' METs -- -- 2.39 2.6 --     NuStep   Level -- 1 3 -- 4   SPM -- 80 -- -- --   Minutes -- 15 15 -- 15   METs -- 2 2.6 -- 2.6     Biostep-RELP   Level -- 1 2 -- 2   SPM -- 50 -- -- --   Minutes -- 15 15 -- 15   METs -- 2.2 2 -- 2.5     Home Exercise Plan   Plans to continue exercise at -- -- Home (comment)   walking Home (comment)  walking Home (comment)  walking   Frequency -- -- Add 2 additional days to program exercise sessions.  start with 1 day Add 2 additional days to program exercise sessions.  start with 1 day Add 2 additional days to program exercise sessions.  start with 1 day   Initial Home Exercises Provided -- -- 12/03/20 12/03/20 12/03/20            Exercise Comments:   Exercise Comments     Row Name 11/12/20 1012           Exercise Comments First full day of exercise!  Patient was oriented to gym and equipment including  functions, settings, policies, and procedures.  Patient's individual exercise prescription and treatment plan were reviewed.  All starting workloads were established based on the results of the 6 minute walk test done at initial orientation visit.  The plan for exercise progression was also introduced and progression will be customized based on patient's performance and goals.                Exercise Goals and Review:   Exercise Goals     Row Name 11/08/20 1127             Exercise Goals   Increase Physical Activity Yes       Intervention Provide advice, education, support and counseling about physical activity/exercise needs.;Develop an individualized exercise prescription for aerobic and resistive training based on initial evaluation findings, risk stratification, comorbidities and participant's personal goals.       Expected Outcomes Short Term: Attend rehab on a regular basis to increase amount of physical activity.;Long Term: Add in home exercise to make exercise part of routine and to increase amount of physical activity.;Long Term: Exercising regularly at least 3-5 days a week.       Increase Strength and Stamina Yes       Intervention Provide advice, education, support and counseling about physical activity/exercise needs.;Develop an individualized exercise prescription for aerobic and resistive training based on initial evaluation  findings, risk stratification, comorbidities and participant's personal goals.       Expected Outcomes Short Term: Increase workloads from initial exercise prescription for resistance, speed, and METs.;Short Term: Perform resistance training exercises routinely during rehab and add in resistance training at home;Long Term: Improve cardiorespiratory fitness, muscular endurance and strength as measured by increased METs and functional capacity (6MWT)       Able to understand and use rate of perceived exertion (RPE) scale Yes       Intervention Provide education and explanation on how to use RPE scale       Expected Outcomes Short Term: Able to use RPE daily in rehab to express subjective intensity level;Long Term:  Able to use RPE to guide intensity level when exercising independently       Able to understand and use Dyspnea scale Yes       Intervention Provide education and explanation on how to use Dyspnea scale       Expected Outcomes Short Term: Able to use Dyspnea scale daily in rehab to express subjective sense of shortness of breath during exertion;Long Term: Able to use Dyspnea scale to guide intensity level when exercising independently       Knowledge and understanding of Target Heart Rate Range (THRR) Yes       Intervention Provide education and explanation of THRR including how the numbers were predicted and where they are located for reference       Expected Outcomes Short Term: Able to state/look up THRR;Long Term: Able to use THRR to govern intensity when exercising independently;Short Term: Able to use daily as guideline for intensity in rehab       Able to check pulse independently Yes       Intervention Provide education and demonstration on how to check pulse in carotid and radial arteries.;Review the importance of being able to check your own pulse for safety during independent exercise       Expected Outcomes Short Term: Able to explain why pulse checking is important during  independent exercise;Long Term: Able to check pulse independently and accurately  Understanding of Exercise Prescription Yes       Intervention Provide education, explanation, and written materials on patient's individual exercise prescription       Expected Outcomes Short Term: Able to explain program exercise prescription;Long Term: Able to explain home exercise prescription to exercise independently                Exercise Goals Re-Evaluation :  Exercise Goals Re-Evaluation     Row Name 11/12/20 1013 11/20/20 1409 12/03/20 1016 12/17/20 0837 12/28/20 0955     Exercise Goal Re-Evaluation   Exercise Goals Review Understanding of Exercise Prescription;Knowledge and understanding of Target Heart Rate Range (THRR);Able to understand and use Dyspnea scale;Able to understand and use rate of perceived exertion (RPE) scale Increase Physical Activity;Increase Strength and Stamina Increase Physical Activity;Increase Strength and Stamina;Understanding of Exercise Prescription;Able to check pulse independently;Able to understand and use rate of perceived exertion (RPE) scale;Knowledge and understanding of Target Heart Rate Range (THRR) Increase Physical Activity;Increase Strength and Stamina Increase Physical Activity;Increase Strength and Stamina   Comments Reviewed RPE and dyspnea scales, THR and program prescription with pt today.  Pt voiced understanding and was given a copy of goals to take home. Jacob Harrison is tolerating exercise well.  he has been able to complete assigned workloads.  Staff will monitor progress. Reviewed home exercise with pt today.  Pt plans to walk and stretch for exercise.  Reviewed THR, pulse, RPE, sign and symptoms, pulse oximetery and when to call 911 or MD. Start with 1 day of walking at home.  Also discussed weather considerations and indoor options.  Pt voiced understanding. Jacob Harrison is progressing well and has increased to level 2 on RB.  He reaches THR range and attends  consistently.  Staff will monitor progress. Jacob Harrison will walk 2x/week at home in addition to rehab; 25 minutes with small rest in-between - his driveway is about the same size as our track at rehab (RPE 11 fro first 15 minutes, RPE 13 fro the rest of the time).   Expected Outcomes Short: Use RPE daily to regulate intensity. Long: Follow program prescription in THR. Short:  attend consistently Long:  improve overall stamina Short: Add on 1 day of exercise at home and check HR Long: Continue to increase overall MET level Short: continue to attend consistently Long:  build overall stamina Short: continue to attend consistently, continue walking at home outside of rehab, include stretching weekly Long:  build overall stamina    Row Name 12/31/20 1622             Exercise Goal Re-Evaluation   Exercise Goals Review Increase Physical Activity;Increase Strength and Stamina;Understanding of Exercise Prescription       Comments Jacob Harrison has been doing well in rehab.  He is up to level 4 on the NuStep.  We will continue to monitor his progress.       Expected Outcomes Short: Try 4 lb weights and improve post 6MWT Long: Continue to improve stamina                Discharge Exercise Prescription (Final Exercise Prescription Changes):  Exercise Prescription Changes - 12/31/20 1600       Response to Exercise   Blood Pressure (Admit) 112/60    Blood Pressure (Exit) 116/64    Heart Rate (Admit) 61 bpm    Heart Rate (Exercise) 90 bpm    Heart Rate (Exit) 84 bpm    Oxygen Saturation (Admit) 95 %    Oxygen Saturation (Exercise)  94 %    Oxygen Saturation (Exit) 94 %    Rating of Perceived Exertion (Exercise) 13    Perceived Dyspnea (Exercise) 1    Symptoms none    Duration Continue with 30 min of aerobic exercise without signs/symptoms of physical distress.    Intensity THRR unchanged      Progression   Progression Continue to progress workloads to maintain intensity without signs/symptoms of physical  distress.    Average METs 2.25      Resistance Training   Training Prescription Yes    Weight 3 lb    Reps 10-15      Interval Training   Interval Training No      Treadmill   MPH 1.5    Grade 0    Minutes 15    METs 2.15      Recumbant Bike   Level 3    Minutes 15      NuStep   Level 4    Minutes 15    METs 2.6      Biostep-RELP   Level 2    Minutes 15    METs 2.5      Home Exercise Plan   Plans to continue exercise at Home (comment)   walking   Frequency Add 2 additional days to program exercise sessions.   start with 1 day   Initial Home Exercises Provided 12/03/20             Nutrition:  Target Goals: Understanding of nutrition guidelines, daily intake of sodium <1580m, cholesterol <2046m calories 30% from fat and 7% or less from saturated fats, daily to have 5 or more servings of fruits and vegetables.  Education: All About Nutrition: -Group instruction provided by verbal, written material, interactive activities, discussions, models, and posters to present general guidelines for heart healthy nutrition including fat, fiber, MyPlate, the role of sodium in heart healthy nutrition, utilization of the nutrition label, and utilization of this knowledge for meal planning. Follow up email sent as well. Written material given at graduation. Flowsheet [facility]  Date 12/26/20  [facility] Code 1- Verbalizes Understanding       Biometrics:  Pre Biometrics - 11/08/20 1113       Pre Biometrics   Height 5' 6.1" (1.679 m)    Weight 176 lb 3.2 oz (79.9 kg)    BMI (Calculated) 28.35    Single Leg Stand 11.6 seconds             Post Biometrics - 01/04/21 0959        Post  Biometrics   Height 5' 6.1" (1.679 m)    Weight 168 lb 9.6 oz (76.5 kg)    BMI (Calculated) 27.13    Single Leg Stand 2.7 seconds             Nutrition Therapy Plan and Nutrition Goals:   Nutrition Therapy & Goals - 11/26/20 1048       Nutrition Therapy   Diet heart healthy, low Na, diabetes friendly    Drug/Food Interactions Statins/Certain Fruits    Protein (specify units) 60g    Fiber 30 grams    Whole Grain Foods 3 servings    Saturated Fats 12 max. grams    Fruits and Vegetables 8 servings/day    Sodium 1.5 grams      Personal Nutrition Goals   Nutrition Goal ST: try adding prunes, maintain fluid intake,  consult MD regarding low dose magnesium LT: maintain changes made, increase variety of vegetables (eat the rainbow in a week), add heart healthy fats such as nuts/seeds    Comments B: bowl of cereal with fruit or fruit on it's own or egg or plant based sausage. Drinks most of the day, snacks on fruit. L: chicken sandwich or tomato sandiwch (whole wheat bread- ezekiel bread) or leftovers D: airfries food a lot. piece of meat like lean beef or chicken and greens with brown rice or wheat pasta with tomato sauce. He cut out salt and that was very hard for him. A1C 6.1. He feels he is eating more sugar than he did before - natural sugar from fruit for the most part. They have a garden. He feels his calories has decreased - lost 30 pounds in 5 months. He has been constipated - 2 quarts water, 2 12 oz sodas (diet). he feels the lasix (2 pills is causing his constipation - this is a common side effect). Suggested prunes, maintain fluid with increased fiber, a consulting his doctor regarding a low dose of magnesium which can relax his bowel. Discussed inclusion of heart healthy fats and how to flavor food as he has made so many changes and is struggling with enjoying some of them.Discussed heart healthy eating and diabetes friendly eating.      Intervention Plan   Intervention Prescribe, educate and counsel regarding individualized specific dietary modifications aiming towards targeted core components such as weight, hypertension, lipid management, diabetes, heart failure and other  comorbidities.;Nutrition handout(s) given to patient.    Expected Outcomes Short Term Goal: Understand basic principles of dietary content, such as calories, fat, sodium, cholesterol and nutrients.;Short Term Goal: A plan has been developed with personal nutrition goals set during dietitian appointment.;Long Term Goal: Adherence to prescribed nutrition plan.             Nutrition Assessments:  MEDIFICTS Score Key: ?70 Need to make dietary changes  40-70 Heart Healthy Diet ? 40 Therapeutic Level Cholesterol Diet  Flowsheet Row Cardiac Rehab from 01/09/2021 in [facility] Cardiac and Pulmonary Rehab  Picture Your Plate Total Score on Admission 97  Picture Your Plate Total Score on Discharge 82      Picture Your Plate Scores: <70 Unhealthy dietary pattern with much room for improvement. 41-50 Dietary pattern unlikely to meet recommendations for good health and room for improvement. 51-60 More healthful dietary pattern, with some room for improvement.  >60 Healthy dietary pattern, although there may be some specific behaviors that could be improved.    Nutrition Goals Re-Evaluation:  Nutrition Goals Re-Evaluation     [place] Name 12/28/20 0957             Goals   Nutrition Goal ST: adding protein source in AM (peanut butter or boiled egg), one fat source at each meal (liquid plant oil, avocado, nuts/seeds).  LT: maintain current changes       Comment He reports that his constipation was relieved by eating prunes daily, he continues to keep hydrated. He reports doing well with his nutrition, but struggles with his salt intake as many foods are high in salt. He feels he is staying between 1500-2018m. He does not feel well if he eats more than 500 mg of sodium at meal time. He likes spices like cajun spices that he enjoys. He bought a can of mixed nuts, but he wishes it had more peanuts - suggested he add a bag of unsalted peanuts to his  mix. He also uses avocado and olive oil. He has lost 20  lbs since July, he would like to be weight stable.       Expected Outcome ST: continue to read labels to manage salt LT: maintain current changes                Nutrition Goals Discharge (Final Nutrition Goals Re-Evaluation):  Nutrition Goals Re-Evaluation - 12/28/20 0957       Goals   Nutrition Goal ST: adding protein source in AM (peanut butter or boiled egg), one fat source at each meal (liquid plant oil, avocado, nuts/seeds).  LT: maintain current changes    Comment He reports that his constipation was relieved by eating prunes daily, he continues to keep hydrated. He reports doing well with his nutrition, but struggles with his salt intake as many foods are high in salt. He feels he is staying between 1500-203m. He does not feel well if he eats more than 500 mg of sodium at meal time. He likes spices like cajun spices that he enjoys. He bought a can of mixed nuts, but he wishes it had more peanuts - suggested he add a bag of unsalted peanuts to his mix. He also uses avocado and olive oil. He has lost 20 lbs since July, he would like to be weight stable.    Expected Outcome ST: continue to read labels to manage salt LT: maintain current changes             Psychosocial: Target Goals: Acknowledge presence or absence of significant depression and/or stress, maximize coping skills, provide positive support system. Participant is able to verbalize types and ability to use techniques and skills needed for reducing stress and depression.   Education: Stress, Anxiety, and Depression - Group verbal and visual presentation to define topics covered.  Reviews how body is impacted by stress, anxiety, and depression.  Also discusses healthy ways to reduce stress and to treat/manage anxiety and depression.  Written material given at graduation. [facility]  Date 11/14/20  [facility] Code 1- Verbalizes  Understanding       Education: Sleep Hygiene -Provides group verbal and written instruction about how sleep can affect your health.  Define sleep hygiene, discuss sleep cycles and impact of sleep habits. Review good sleep hygiene tips.    Initial Review & Psychosocial Screening:  Initial Psych Review & Screening - 11/02/20 1013       Initial Review   Current issues with None Identified      Family Dynamics   Good Support System? Yes   Wife of 413years, [place]     Barriers   Psychosocial barriers to participate in program There are no identifiable barriers or psychosocial needs.      Screening Interventions   Interventions Encouraged to exercise    Expected Outcomes Short Term goal: Utilizing psychosocial counselor, staff and physician to assist with identification of specific Stressors or current issues interfering with healing process. Setting desired goal for each stressor or current issue identified.;Long Term Goal: Stressors or current issues are controlled or eliminated.;Short Term goal: Identification and review with participant of any Quality of Life or Depression concerns found by scoring the questionnaire.;Long Term goal: The participant improves quality of Life and PHQ9 Scores as seen by post scores and/or verbalization of changes             Quality  of Life Scores:   Quality of Life - 01/09/21 0949       Quality of Life Scores   Health/Function Pre 16.27 %    Health/Function Post 21.47 %    Health/Function % Change 31.96 %    Socioeconomic Pre 25 %    Socioeconomic Post 27.56 %    Socioeconomic % Change  10.24 %    Psych/Spiritual Pre 25.57 %    Psych/Spiritual Post 26.57 %    Psych/Spiritual % Change 3.91 %    Family Pre 28.5 %    Family Post 30 %    Family % Change 5.26 %    GLOBAL Pre 21.87 %    GLOBAL Post 25.1 %    GLOBAL % Change 14.77 %            Scores of 19 and below usually indicate a poorer quality of life in these  areas.  A difference of  2-3 points is a clinically meaningful difference.  A difference of 2-3 points in the total score of the Quality of Life Index has been associated with significant improvement in overall quality of life, self-image, physical symptoms, and general health in studies assessing change in quality of life.  PHQ-9: Recent Review Flowsheet Data     Depression screen [facility] 2/9 01/09/2021 01/07/2021 12/03/2020 11/08/2020 09/04/2020   Decreased Interest '1 1 1 1 ' 0   Down, Depressed, Hopeless 0 0 0 0 0   PHQ - 2 Score '1 1 1 1 ' 0   Altered sleeping 0 0 0 1 1   Tired, decreased energy '2 1 1 2 1   ' Change in appetite 0 1 0 1 0   Feeling bad or failure about yourself  1 0 0 0 0   Trouble concentrating '1 1 1 1 ' 0   Moving slowly or fidgety/restless 0 0 '1 1 1   ' Suicidal thoughts 0 0 0 0 0   PHQ-9 Score '5 4 4 7 3   ' Difficult doing work/chores Very difficult Somewhat difficult Somewhat difficult Somewhat difficult Somewhat difficult      Interpretation of Total Score  Total Score Depression Severity:  1-4 = Minimal depression, 5-9 = Mild depression, 10-14 = Moderate depression, 15-19 = Moderately severe depression, 20-27 = Severe depression   Psychosocial Evaluation and Intervention:  Psychosocial Evaluation - 11/02/20 1020       Psychosocial Evaluation & Interventions   Interventions Encouraged to exercise with the program and follow exercise prescription    Comments Jacob Harrison has no barriers to attending the program. He was in the hospital in bed for 6 days and feels his strength and stamina is decreased. He hopes to see improvement in both by attending the program. He had significant weight loss in the hospital,and has continued with some weight loss;he has a goal of 165 lb. He lives with his wife and has children within 15- 20 minutes. THey are all part of his support team. He should do well with the program.    Expected Outcomes STG: Jacob Harrison attends all scheduled sessions He will expoereince an   increase in  strength and stamina LTG: Jacob Harrison will comtinue withhis exercise progression, and continue with his weight loss to his goal of 165 lb.    Continue Psychosocial Services  Follow up required by staff             Psychosocial Re-Evaluation:  Psychosocial Re-Evaluation     [place] Name 12/03/20 1004 12/28/20 1012  Psychosocial Re-Evaluation   Current issues with Current Stress Concerns None Identified      Comments Jacob Harrison has no complaints at this time. He is worried he may not be able to get back to what he used to be able to do such as outdoor activities and yardwork. He is really motivated to get stronger. Sleep is good and his biggest supporter is his  wife. He is not on any medications for depression/anxiety. His PHQ scored a 4. He felt like he has improved with his mood since he has been exercising but still wants to improve his overall physical health. Jacob Harrison reports no stress concerns at this time. He is motivated to continue improving. He is still sleeping well, but he gets up to use the bathroom. His wife is still a good support system for him. He loves gardening and yardwork. He is refinishing a Location manager.      Expected Outcomes Short: Continue attendance with rehab Long: Utilize exercise for stress management and maintain positive attitude Short: Continue attendance with rehab Long: Utilize exercise for stress management and maintain positive attitude      Interventions Encouraged to attend Cardiac Rehabilitation for the exercise Encouraged to attend Cardiac Rehabilitation for the exercise      Continue Psychosocial Services  Follow up required by staff Follow up required by staff               Psychosocial Discharge (Final Psychosocial Re-Evaluation):  Psychosocial Re-Evaluation - 12/28/20 1012       Psychosocial Re-Evaluation   Current issues with None Identified    Comments Jacob Harrison reports no stress concerns at this time. He is motivated to continue  improving. He is still sleeping well, but he gets up to use the bathroom. His wife is still a good support system for him. He loves gardening and yardwork. He is refinishing a Location manager.    Expected Outcomes Short: Continue attendance with rehab Long: Utilize exercise for stress management and maintain positive attitude    Interventions Encouraged to attend Cardiac Rehabilitation for the exercise    Continue Psychosocial Services  Follow up required by staff             Vocational Rehabilitation: Provide vocational rehab assistance to qualifying candidates.   Vocational Rehab Evaluation & Intervention:   Education: Education Goals: Education classes will be provided on a variety of topics geared toward better understanding of heart health and risk factor modification. Participant will state understanding/return demonstration of topics presented as noted by education test scores.  Learning Barriers/Preferences:   General Cardiac Education Topics:  AED/CPR: - Group verbal and written instruction with the use of models to demonstrate the basic use of the AED with the basic ABC's of resuscitation.   Anatomy and Cardiac Procedures: - Group verbal and visual presentation and models provide information about basic cardiac anatomy and function. Reviews the testing methods done to diagnose heart disease and the outcomes of the test results. Describes the treatment choices: Medical Management, Angioplasty, or Coronary Bypass Surgery for treating various heart conditions including Myocardial Infarction, Angina, Valve Disease, and Cardiac Arrhythmias.  Written material given at graduation. Flowsheet Row Cardiac Rehab from 01/09/2021 in [facility] Cardiac and Pulmonary Rehab  Date 12/05/20  Educator [facility]  Instruction Review Code 1- Verbalizes Understanding       Medication Safety: - Group verbal and visual instruction to review commonly prescribed medications for heart and lung disease.  Reviews the medication, class of the drug, and side  effects. Includes the steps to properly store meds and maintain the prescription regimen.  Written material given at graduation. Flowsheet Row Cardiac Rehab from 01/09/2021 in [facility] Cardiac and Pulmonary Rehab  Date 12/19/20  Educator SB  Instruction Review Code 1- Verbalizes Understanding       Intimacy: - Group verbal instruction through game format to discuss how heart and lung disease can affect sexual intimacy. Written material given at graduation.. Flowsheet Row Cardiac Rehab from 01/09/2021 in [facility] Cardiac and Pulmonary Rehab  Date 11/28/20  Educator [facility]  Instruction Review Code 1- Verbalizes Understanding       Know Your Numbers and Heart Failure: - Group verbal and visual instruction to discuss disease risk factors for cardiac and pulmonary disease and treatment options.  Reviews associated critical values for Overweight/Obesity, Hypertension, Cholesterol, and Diabetes.  Discusses basics of heart failure: signs/symptoms and treatments.  Introduces Heart Failure Zone chart for action plan for heart failure.  Written material given at graduation. Flowsheet Row Cardiac Rehab from 01/09/2021 in [facility] Cardiac and Pulmonary Rehab  Date 01/02/21  Educator KB  Instruction Review Code 1- Verbalizes Understanding       Infection Prevention: - Provides verbal and written material to individual with discussion of infection control including proper hand washing and proper equipment cleaning during exercise session. Flowsheet Row Cardiac Rehab from 01/09/2021 in [facility] Cardiac and Pulmonary Rehab  Education need identified 11/08/20  Date 11/08/20  Educator [place]  Instruction Review Code 1- Verbalizes Understanding       Falls Prevention: - Provides verbal and written material to individual with discussion of falls prevention and safety. Flowsheet Row Cardiac Rehab from 01/09/2021 in [facility] Cardiac and Pulmonary Rehab  Education need  identified 11/08/20  Date 11/08/20  Educator [place]  Instruction Review Code 1- Verbalizes Understanding       Other: -Provides group and verbal instruction on various topics (see comments)   Knowledge Questionnaire Score:  Knowledge Questionnaire Score - 01/09/21 0948       Knowledge Questionnaire Score   Pre Score 25/26: Angina    Post Score 25/26             Core Components/Risk Factors/Patient Goals at Admission:  Personal Goals and Risk Factors at Admission - 11/08/20 1127       Core Components/Risk Factors/Patient Goals on Admission    Weight Management Yes;Weight Loss    Intervention Weight Management: Develop a combined nutrition and exercise program designed to reach desired caloric intake, while maintaining appropriate intake of nutrient and fiber, sodium and fats, and appropriate energy expenditure required for the weight goal.;Weight Management: Provide education and appropriate resources to help participant work on and attain dietary goals.    Admit Weight 176 lb (79.8 kg)    Goal Weight: Short Term 171 lb (77.6 kg)    Goal Weight: Long Term 160 lb (72.6 kg)   Goal per patient   Expected Outcomes Short Term: Continue to assess and modify interventions until short term weight is achieved;Weight Loss: Understanding of general recommendations for a balanced deficit meal plan, which promotes 1-2 lb weight loss per week and includes a negative energy balance of [phone] kcal/d;Understanding of distribution of calorie intake throughout the day with the consumption of 4-5 meals/snacks;Understanding recommendations for meals to include 15-35% energy as protein, 25-35% energy from fat, 35-60% energy from carbohydrates, less than 225m of dietary cholesterol, 20-35 gm of total fiber daily    Diabetes Yes    Intervention Provide education about signs/symptoms and  action to take for hypo/hyperglycemia.;Provide education about proper nutrition, including hydration, and  aerobic/resistive exercise prescription along with prescribed medications to achieve blood glucose in normal ranges: Fasting glucose 65-99 mg/dL    Expected Outcomes Short Term: Participant verbalizes understanding of the signs/symptoms and immediate care of hyper/hypoglycemia, proper foot care and importance of medication, aerobic/resistive exercise and nutrition plan for blood glucose control.;Long Term: Attainment of HbA1C < 7%.    Hypertension Yes    Intervention Provide education on lifestyle modifcations including regular physical activity/exercise, weight management, moderate sodium restriction and increased consumption of fresh fruit, vegetables, and low fat dairy, alcohol moderation, and smoking cessation.;Monitor prescription use compliance.    Expected Outcomes Short Term: Continued assessment and intervention until BP is < 140/72m HG in hypertensive participants. < 130/837mHG in hypertensive participants with diabetes, heart failure or chronic kidney disease.;Long Term: Maintenance of blood pressure at goal levels.    Lipids Yes    Intervention Provide education and support for participant on nutrition & aerobic/resistive exercise along with prescribed medications to achieve LDL <7057mHDL >76m54m  Expected Outcomes Short Term: Participant states understanding of desired cholesterol values and is compliant with medications prescribed. Participant is following exercise prescription and nutrition guidelines.;Long Term: Cholesterol controlled with medications as prescribed, with individualized exercise RX and with personalized nutrition plan. Value goals: LDL < 70mg88mL > 40 mg.             Education:Diabetes - Individual verbal and written instruction to review signs/symptoms of diabetes, desired ranges of glucose level fasting, after meals and with exercise. Acknowledge that pre and post exercise glucose checks will be done for 3 sessions at entry of program. [place] [facility]  Education need identified 11/08/20  Date 11/08/20  Educator [place] Review Code 1- Verbalizes Understanding       Core Components/Risk Factors/Patient Goals Review:   Goals and Risk Factor Review     Row Name 12/03/20 0957 12/28/20 1004           Core Components/Risk Factors/Patient Goals Review   Personal Goals Review Weight Management/Obesity;Hypertension;Lipids;Diabetes Weight Management/Obesity;Hypertension;Diabetes      Review Jacob Harrison's weight has gone down 4 lbs since he started here. He is walking more and eating more proportion sizes. He weighs himself everday and is aware to make sure there is no significant weight gain in a short period of time. His sugars range from 105-110 fasting. He does check his sugars everyday. His last A1C checked last was 6.1%. He has an appointment with PCP late october which he may check his cholesterol at this time. Cardiologist appointment is next month to follow up as he recently had an echo. BP have been stable at rehab and at home. It sometimes run on the lower side- he is aware to stay hydrated and check it if he ever feels symptomatic. He reports his BG has been running well: 100-110 in AM. He has an appointment to check his A1C next week. He continues to weigh himself daily, he reports tredning down in weight - today 167lbs, was 178lbs in July. His energy level is stable, but on the lower end. He reports his appetite has gone down a bit. Working on nutrition to keep weight stable. He checks his BP at home 100-106/50-60. He continues to take his medications as directed.      Expected Outcomes Short: Continue checking weight at home Long: Continue to manage  lifestyle risk factors Short: Continue checking weight at home Long: Continue to manage lifestyle risk factors               Core Components/Risk Factors/Patient Goals at Discharge (Final Review):   Goals and Risk Factor Review -  12/28/20 1004       Core Components/Risk Factors/Patient Goals Review   Personal Goals Review Weight Management/Obesity;Hypertension;Diabetes    Review He reports his BG has been running well: 100-110 in AM. He has an appointment to check his A1C next week. He continues to weigh himself daily, he reports tredning down in weight - today 167lbs, was 178lbs in July. His energy level is stable, but on the lower end. He reports his appetite has gone down a bit. Working on nutrition to keep weight stable. He checks his BP at home 100-106/50-60. He continues to take his medications as directed.    Expected Outcomes Short: Continue checking weight at home Long: Continue to manage lifestyle risk factors             ITP Comments:  ITP Comments     Row Name 11/02/20 1030 11/08/20 1047 11/12/20 1012 11/14/20 0815 11/26/20 1144   ITP Comments Virtual orientation call completed today. he has an appointment on Date: 11/08/2020  for EP eval and gym Orientation.  Documentation of diagnosis can be found in [facility] Date: 10/04/2020 . Completed 6MWT and gym orientation. Initial ITP created and sent for review to Dr. Emily Filbert, Medical Director. First full day of exercise!  Patient was oriented to gym and equipment including functions, settings, policies, and procedures.  Patient's individual exercise prescription and treatment plan were reviewed.  All starting workloads were established based on the results of the 6 minute walk test done at initial orientation visit.  The plan for exercise progression was also introduced and progression will be customized based on patient's performance and goals. 30 Day review completed. Medical Director ITP review done, changes made as directed, and signed approval by Medical Director.    New to program Completed initial RD evaluation    Row Name 12/12/20 0746 01/09/21 1406         ITP Comments 30 Day review completed. Medical Director ITP review done, changes made as directed,  and signed approval by Medical Director. 30 day review completed. ITP sent to Dr. Emily Filbert, Medical Director of Cardiac Rehab. Continue with ITP unless changes are made by physician.               Comments: 30 day review

## 2021-01-11 ENCOUNTER — Other Ambulatory Visit: Payer: Self-pay

## 2021-01-11 ENCOUNTER — Encounter: Payer: Medicare Other | Admitting: *Deleted

## 2021-01-11 DIAGNOSIS — I214 Non-ST elevation (NSTEMI) myocardial infarction: Secondary | ICD-10-CM | POA: Diagnosis not present

## 2021-01-11 NOTE — Progress Notes (Signed)
Daily Session Note  Patient Details  Name: Davionte Lusby MRN: [id number] Date of Birth: 03-10-1941 Referring Provider:   Flowsheet Row Cardiac Rehab from 11/08/2020 in [facility] Cardiac and Pulmonary Rehab  Referring Provider End, Harrell Gave MD       Encounter Date: 01/11/2021  Check In:  Session Check In - 01/11/21 0953       Check-In   Supervising physician immediately available to respond to emergencies See telemetry face sheet for immediately available ER MD    Location [facility]-Cardiac & Pulmonary Rehab    Staff Present Renita Papa, RN BSN;Joseph Rosburg, RCP,RRT,BSRT;Jessica Lynden, [place], RCEP, CCRP, CCET    Virtual Visit No    Medication changes reported     No    Fall or balance concerns reported    No    Warm-up and Cool-down Performed on first and last piece of equipment    Resistance Training Performed Yes    VAD Patient? No    PAD/SET Patient? No      Pain Assessment   Currently in Pain? No/denies                Social History   Tobacco Use  Smoking Status Never  Smokeless Tobacco Never    Goals Met:  Independence with exercise equipment Exercise tolerated well No report of concerns or symptoms today Strength training completed today  Goals Unmet:  Not Applicable  Comments: Pt able to follow exercise prescription today without complaint.  Will continue to monitor for progression.    Dr. Emily Filbert is Medical Director for [facility].  Dr. Ottie Glazier is Medical Director for [facility] Pulmonary Rehabilitation.

## 2021-01-13 ENCOUNTER — Other Ambulatory Visit: Payer: Self-pay | Admitting: Internal Medicine

## 2021-01-14 ENCOUNTER — Other Ambulatory Visit: Payer: Self-pay

## 2021-01-14 ENCOUNTER — Ambulatory Visit: Payer: Medicare Other | Admitting: Medical

## 2021-01-14 ENCOUNTER — Other Ambulatory Visit: Payer: Self-pay | Admitting: Physician Assistant

## 2021-01-14 ENCOUNTER — Encounter: Payer: Self-pay | Admitting: Medical

## 2021-01-14 VITALS — BP 98/56 | HR 66 | Ht 66.0 in | Wt 166.0 lb

## 2021-01-14 DIAGNOSIS — J189 Pneumonia, unspecified organism: Secondary | ICD-10-CM

## 2021-01-14 DIAGNOSIS — E785 Hyperlipidemia, unspecified: Secondary | ICD-10-CM

## 2021-01-14 DIAGNOSIS — I5022 Chronic systolic (congestive) heart failure: Secondary | ICD-10-CM | POA: Diagnosis not present

## 2021-01-14 DIAGNOSIS — E119 Type 2 diabetes mellitus without complications: Secondary | ICD-10-CM

## 2021-01-14 DIAGNOSIS — I255 Ischemic cardiomyopathy: Secondary | ICD-10-CM

## 2021-01-14 DIAGNOSIS — I214 Non-ST elevation (NSTEMI) myocardial infarction: Secondary | ICD-10-CM | POA: Diagnosis not present

## 2021-01-14 DIAGNOSIS — I152 Hypertension secondary to endocrine disorders: Secondary | ICD-10-CM

## 2021-01-14 DIAGNOSIS — E1159 Type 2 diabetes mellitus with other circulatory complications: Secondary | ICD-10-CM

## 2021-01-14 DIAGNOSIS — I25118 Atherosclerotic heart disease of native coronary artery with other forms of angina pectoris: Secondary | ICD-10-CM

## 2021-01-14 DIAGNOSIS — R052 Subacute cough: Secondary | ICD-10-CM

## 2021-01-14 MED ORDER — AMOXICILLIN-POT CLAVULANATE 875-125 MG PO TABS: 1.0000 | ORAL_TABLET | Freq: Two times a day (BID) | ORAL | 0 refills | Status: DC

## 2021-01-14 MED ORDER — AZITHROMYCIN 250 MG PO TABS: ORAL_TABLET | ORAL | 0 refills | Status: AC

## 2021-01-14 NOTE — Patient Instructions (Addendum)
Medication Instructions:  - Your physician recommends that you continue on your current medications as directed. Please refer to the Current Medication list given to you today.  *If you need a refill on your cardiac medications before your next appointment, please call your pharmacy*   Lab Work: - none ordered  If you have labs (blood work) drawn today and your tests are completely normal, you will receive your results only by: [place] (if you have MyChart) OR A paper copy in the mail If you have any lab test that is abnormal or we need to change your treatment, we will call you to review the results.   Testing/Procedures: - Your physician has requested that you have an echocardiogram- in 1 month. Echocardiography is a painless test that uses sound waves to create images of your heart. It provides your doctor with information about the size and shape of your heart and how well your heart's chambers and valves are working. This procedure takes approximately one hour. There are no restrictions for this procedure. There is a possibility that an IV may need to be started during your test to inject an image enhancing agent. This is done to obtain more optimal pictures of your heart. Therefore we ask that you do at least drink some water prior to coming in to hydrate your veins.     Follow-Up: At [facility], you and your health needs are our priority.  As part of our continuing mission to provide you with exceptional heart care, we have created designated Provider Care Teams.  These Care Teams include your primary Cardiologist (physician) and Advanced Practice Providers (APPs -  Physician Assistants and Nurse Practitioners) who all work together to provide you with the care you need, when you need it.  We recommend signing up for the patient portal called "MyChart".  Sign up information is provided on this After Visit Summary.  MyChart is used to connect with patients for Virtual Visits  (Telemedicine).  Patients are able to view lab/test results, encounter notes, upcoming appointments, etc.  Non-urgent messages can be sent to your provider as well.   To learn more about what you can do with MyChart, go to [email or web address].    Your next appointment:   3 month(s)  The format for your next appointment:   In Person  Provider:   You may see Nelva Bush, MD or one of the following Advanced Practice Providers on your designated Care Team:   Murray Hodgkins, NP Christell Faith, PA-C Marrianne Mood, PA-C Cadence Kathlen Mody, [place]   Other Instructions  Echocardiogram An echocardiogram is a test that uses sound waves (ultrasound) to produce images of the heart. Images from an echocardiogram can provide important information about: Heart size and shape. The size and thickness and movement of your heart's walls. Heart muscle function and strength. Heart valve function or if you have stenosis. Stenosis is when the heart valves are too narrow. If blood is flowing backward through the heart valves (regurgitation). A tumor or infectious growth around the heart valves. Areas of heart muscle that are not working well because of poor blood flow or injury from a heart attack. Aneurysm detection. An aneurysm is a weak or damaged part of an artery wall. The wall bulges out from the normal force of blood pumping through the body. Tell a health care provider about: Any allergies you have. All medicines you are taking, including vitamins, herbs, eye drops, creams, and over-the-counter medicines. Any blood disorders you have. Any  surgeries you have had. Any medical conditions you have. Whether you are pregnant or may be pregnant. What are the risks? Generally, this is a safe test. However, problems may occur, including an allergic reaction to dye (contrast) that may be used during the test. What happens before the test? No specific preparation is needed. You may eat and drink  normally. What happens during the test?  You will take off your clothes from the waist up and put on a hospital gown. Electrodes or electrocardiogram (ECG)patches may be placed on your chest. The electrodes or patches are then connected to a device that monitors your heart rate and rhythm. You will lie down on a table for an ultrasound exam. A gel will be applied to your chest to help sound waves pass through your skin. A handheld device, called a transducer, will be pressed against your chest and moved over your heart. The transducer produces sound waves that travel to your heart and bounce back (or "echo" back) to the transducer. These sound waves will be captured in real-time and changed into images of your heart that can be viewed on a video monitor. The images will be recorded on a computer and reviewed by your health care provider. You may be asked to change positions or hold your breath for a short time. This makes it easier to get different views or better views of your heart. In some cases, you may receive contrast through an IV in one of your veins. This can improve the quality of the pictures from your heart. The procedure may vary among health care providers and hospitals. What can I expect after the test? You may return to your normal, everyday life, including diet, activities, and medicines, unless your health care provider tells you not to do that. Follow these instructions at home: It is up to you to get the results of your test. Ask your health care provider, or the department that is doing the test, when your results will be ready. Keep all follow-up visits. This is important. Summary An echocardiogram is a test that uses sound waves (ultrasound) to produce images of the heart. Images from an echocardiogram can provide important information about the size and shape of your heart, heart muscle function, heart valve function, and other possible heart problems. You do not need to do  anything to prepare before this test. You may eat and drink normally. After the echocardiogram is completed, you may return to your normal, everyday life, unless your health care provider tells you not to do that. This information is not intended to replace advice given to you by your health care provider. Make sure you discuss any questions you have with your health care provider. Document Revised: 11/15/2019 Document Reviewed: 11/15/2019 Elsevier Patient Education  2022 [employer].

## 2021-01-14 NOTE — Progress Notes (Signed)
Daily Session Note  Patient Details  Name: Jacob Harrison MRN: [id number] Date of Birth: Jul 20, 1940 Referring Provider:   Flowsheet Row Cardiac Rehab from 11/08/2020 in [facility]  Referring Provider End, Harrell Gave MD       Encounter Date: 01/14/2021  Check In:  Session Check In - 01/14/21 0947       Check-In   Supervising physician immediately available to respond to emergencies See telemetry face sheet for immediately available ER MD    Location [facility]-Cardiac & Pulmonary Rehab    Staff Present Nada Maclachlan, BA, ACSM CEP, Exercise Physiologist;Kelly Amedeo Plenty, BS, ACSM CEP, Exercise Physiologist;Samarrah Tranchina, RN,BC,MSN    Virtual Visit No    Medication changes reported     No    Fall or balance concerns reported    No    Tobacco Cessation No Change    Warm-up and Cool-down Performed on first and last piece of equipment    Resistance Training Performed Yes    VAD Patient? No    PAD/SET Patient? No      Pain Assessment   Currently in Pain? No/denies                Social History   Tobacco Use  Smoking Status Never  Smokeless Tobacco Never    Goals Met:  Independence with exercise equipment Exercise tolerated well No report of concerns or symptoms today Strength training completed today  Goals Unmet:  Not Applicable  Comments: Pt able to follow exercise prescription today without complaint.  Will continue to monitor for progression.    Dr. Emily Filbert is Medical Director for [place].  Dr. Ottie Glazier is Medical Director for [facility] Pulmonary [facility].

## 2021-01-14 NOTE — Progress Notes (Signed)
Cardiology Office Note:    Date:  01/14/2021   ID:  Jacob Harrison, DOB 14-Sep-1940, MRN [id number]  PCP:  [name], PA-C  [facility] Cardiologist:  Nelva Bush, MD  [place] Electrophysiologist:  None   Referring MD: [name], PA-C   Chief Complaint: 3-4 week follow-up  History of Present Illness:    Jacob Harrison is a 80 y.o. male with a hx of CAD s/p CABG and redo CABG, DM2, HFpEF, HLD, chronic leg pain due to spinal stenosis, ICM, HFrEF who presents for follow-up.   Seen in November 2020 with bradycardia recommended to cut back on Carvedilol. Due to suboptimal BP control, Amlodipine was increased. When seen May 2021 noted progressive exertional dyspnea. A soft systolic murmur was noted and echocardiogram ordered. He was started on Lasix 20mg  daily for pretibial edema.    Echo 09/23/19 with LVEF 60-65%, no RWMA, G1DD, RV normal size and function, moderate AV sclerosis without stenosis.  Reviewed in depth during clinic visit.   The patient had NSTEMI 09/2020 at which time cardiac cath showed LMCA with mild diffuse disease, LAD with moderate diffuse disease and chronic subtotal occlusion of the mid vessel. Distal LAD fills viz left to right collaterals. LCX with moderate mid vessel disease, RCA with CTO, filling L>R collaterals, SVG to RPDA,LAD, and OM with CTO, LIMA to LAD known to be atretic. LVEDP 41mmHg. Recommendations for medical therapy.    Seen 11/14/20 and denied angina. BP was soft. Recommended discontinuation of Imdur if EF was low on Echo. Echo showed LVEF 30-35%. Lasix was increased  Last seen 12/21/20 and was doing better on increased dose of lasix. Imdur was stopped. Start Losartan if BP can tolerate.  Today, BP is soft, 98/56. He denies dizziness or lightheadedness. Says bp is normally low. Cannot add Losartan today. He denies any changes. NO concerns. No chest pain, SOB.He noticed when he stopped taking Imdur he wasn't able to  exert himself as as before. After 10 days he re-started the Imdur and feels exercise tolerance is better. He just saw PCP for coughing and was given an abx for possible PNA.   Past Medical History:  Diagnosis Date   (HFpEF) heart failure with preserved ejection fraction ([place])    a. 10/2016 Echo: EF 65-70%, mild LVH, mildly dil LA. RV fxn nl.   Arthritis    BPH (benign prostatic hyperplasia)    Coronary artery disease    a. 2001 CABG x 3 [facility]): LIMA->LAD, VG->LCX, VG->RPDA; b. 04/05/2010 Cath ([place]): LM 35m, LAD 147m, LCX 16m, RCA 100p, LIMA->LAD atretic, VG->LCX 100, VG->RCA 80ost, 70d; c. 04/2010 Redo CABG x 2 ([place]): VG->LAD, VG->RPDA; d. 10/2012 Cath [facility]): LM 20-30, LAD 100/95-53m, LCX 40-69m, RCA 100p, 80-100d, RPDA 99, RPL1 95, VG->LAD ok, VG->RPDA ok, EF 67%; e. 11/2016 MV: EF 48%, No ischemia.   Diabetes mellitus without complication ([place])    Type II   Hyperlipidemia    Hypertension    Scoliosis    Spinal stenosis     Past Surgical History:  Procedure Laterality Date   CARDIAC CATHETERIZATION     CORONARY ARTERY BYPASS GRAFT     x 2 (2001 and redo in late 2011 or early 2012)   HERNIA REPAIR Left    inguinal   LEFT HEART CATH AND CORS/GRAFTS ANGIOGRAPHY N/A 10/04/2020   Procedure: LEFT HEART CATH AND CORS/GRAFTS ANGIOGRAPHY;  Surgeon: Leonie Man, MD;  Location: [place] [facility];  Service: Cardiovascular;  Laterality: N/A;  LUMBAR LAMINECTOMY/DECOMPRESSION MICRODISCECTOMY N/A 10/20/2018   Procedure: L3-4, L4-5 LUMBAR DECOMPRESSION with dural repair.;  Surgeon: Marybelle Killings, MD;  Location: [place];  Service: Orthopedics;  Laterality: N/A;   TONSILLECTOMY AND ADENOIDECTOMY     TRANSURETHRAL RESECTION OF PROSTATE  1990    Current Medications: Current Meds  Medication Sig   acetaminophen (TYLENOL) 500 MG tablet Take 1,000 mg by mouth every 6 (six) hours as needed for moderate pain or headache.   amoxicillin-clavulanate (AUGMENTIN) 875-125 MG tablet Take 1 tablet  by mouth 2 (two) times daily.   aspirin EC 81 MG tablet Take 1 tablet (81 mg total) by mouth daily.   azithromycin (ZITHROMAX) 250 MG tablet Take 2 tablets on day 1, then 1 tablet daily on days 2 through 5   carvedilol (COREG) 12.5 MG tablet TAKE 1 TABLET (12.5 MG TOTAL) BY MOUTH 2 (TWO) TIMES DAILY WITH A MEAL.   Cholecalciferol (VITAMIN D-3 PO) Take 800 Units by mouth daily.   clopidogrel (PLAVIX) 75 MG tablet Take 1 tablet (75 mg total) by mouth daily.   Coenzyme Q10 (CO Q-10) 200 MG CAPS Take 200 mg by mouth daily.    cyanocobalamin 500 MCG tablet Take 500 mcg by mouth daily.   diclofenac Sodium (VOLTAREN) 1 % GEL Apply 2 g topically 2 (two) times daily as needed.   ezetimibe (ZETIA) 10 MG tablet TAKE 1 TABLET BY MOUTH EVERY DAY   finasteride (PROSCAR) 5 MG tablet TAKE 1 TABLET BY MOUTH EVERY DAY   furosemide (LASIX) 40 MG tablet TAKE 1 TABLET BY MOUTH TWICE A DAY   Glucosamine-Chondroit-Vit C-Mn (GLUCOSAMINE 1500 COMPLEX) CAPS Take 1 capsule by mouth daily.   isosorbide mononitrate (IMDUR) 30 MG 24 hr tablet TAKE 0.5 TABLETS (15 MG TOTAL) BY MOUTH AT BEDTIME.   Lancets (ONETOUCH ULTRASOFT) lancets Use to check blood sugars fasting daily   metFORMIN (GLUCOPHAGE) 500 MG tablet 1/2 tablet by mouth daily with food   Multiple Vitamins-Minerals (PRESERVISION AREDS 2 PO) Take 1 capsule by mouth 2 (two) times a day.   nitroGLYCERIN (NITROSTAT) 0.4 MG SL tablet Place 1 tablet (0.4 mg total) under the tongue every 5 (five) minutes as needed.   ranolazine (RANEXA) 1000 MG SR tablet TAKE 1 TABLET BY MOUTH TWICE A DAY   rosuvastatin (CRESTOR) 20 MG tablet TAKE 1 TABLET BY MOUTH EVERYDAY AT BEDTIME   [DISCONTINUED] amLODipine (NORVASC) 5 MG tablet Take 2 tablets (10 mg total) by mouth daily.     Allergies:   Other and Tetracyclines & related   Social History   Socioeconomic History   Marital status: Married    Spouse name: Not on file   Number of children: Not on file   Years of education: Not  on file   Highest education level: Not on file  Occupational History   Occupation: retired  Tobacco Use   Smoking status: Never   Smokeless tobacco: Never  Vaping Use   Vaping Use: Never used  Substance and Sexual Activity   Alcohol use: Not Currently    Alcohol/week: 11.0 standard drinks    Types: 5 Glasses of wine, 6 Cans of beer per week    Comment: 5-7 drinks a week (was 10-15 drinks/week)   Drug use: No   Sexual activity: Not Currently    Birth control/protection: None  Other Topics Concern   Not on file  Social History Narrative   Not on file   Social Determinants of Health   Financial Resource Strain: Not  on file  Food Insecurity: Not on file  Transportation Needs: Not on file  Physical Activity: Not on file  Stress: Not on file  Social Connections: Not on file     Family History: The patient's family history includes Alcohol abuse in his maternal uncle; Cancer in his father and paternal uncle; Diabetes in his maternal uncle and mother; Hyperlipidemia in his maternal uncle and mother.  ROS:   Please see the history of present illness.     All other systems reviewed and are negative.  EKGs/Labs/Other Studies Reviewed:    The following studies were reviewed today:  Echo 11/27/20 1. Left ventricular ejection fraction, by estimation, is 30 to 35%. The  left ventricle has moderately decreased function. The left ventricle  demonstrates regional wall motion abnormalities (see scoring  diagram/findings for description). There is mild  left ventricular hypertrophy. Left ventricular diastolic parameters are  consistent with Grade III diastolic dysfunction (restrictive). Elevated  left atrial pressure.   2. Right ventricular systolic function is mildly reduced. The right  ventricular size is normal. Tricuspid regurgitation signal is inadequate  for assessing PA pressure.   3. Left atrial size was mildly dilated.   4. The mitral valve is degenerative. Mild to moderate  mitral valve  regurgitation. No evidence of mitral stenosis.   5. The aortic valve is tricuspid. There is mild calcification of the  aortic valve. There is mild thickening of the aortic valve. Aortic valve  regurgitation is not visualized. Mild aortic valve stenosis. Aortic valve  area, by VTI measures 1.97 cm.  Aortic valve mean gradient measures 9.0 mmHg.   6. The inferior vena cava is normal in size with greater than 50%  respiratory variability, suggesting right atrial pressure of 3 mmHg.   Cardiac cath 10/04/20 -----SEVERE ~ 3 VESSEL NATIVE CAD------ Mid LAD to Dist LAD lesion is 99% stenosed. Mid Cx lesion is 40% stenosed with 40% stenosed side branch in LPAV. Prox RCA to Dist RCA lesion is 100% stenosed. ---- GRAFTS----- SVG-RPDA graft was visualized by angiography. Prox Graft to Insertion lesion is 100% stenosed. SVG-LAD graft was visualized by angiography. Origin to Prox Graft lesion is 100% stenosed. (flush occluded) Unable to Visualize old Grafts (original SVG-rPDA & SVG-LCx). 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 ------ LV end diastolic pressure is moderately elevated. There is no aortic valve stenosis.   SUMMARY Severe native CAD: Long left main the trifurcates into LAD, LCx and RI. The LAD is subtotally occluded after major septal perforator (that provides collaterals to the PDA).  After the septal perforator the vessel essentially occluded yet still gives rise to a diagonal branch and 2 more septal perforators.  There are bridging collaterals filling the distal vessel with faint late filling.  The diagonal branch is small in caliber, but provides collaterals to the distal ramus as well as bridging collaterals to the LAD.  (The 2 small septal perforators do provide collaterals to the PDA) Moderate caliber RI is occluded proximally LCx is roughly 40% mid stenosis at bifurcation into major lateral OM and AV groove branch.  Both branches provide collaterals to the RPL  system. RCA 100% proximal occluded.   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 There are ring markers indicating likely the new grafts:  New SVG-RPDA 100% proximal occlusion.  There is roughly 20 mm of dye hang up indicating relatively recent occlusion-likely culprit lesion.  Not likely to  be salvaged.  Given the extent of collateral flow to the distal RCA system, likely competitive flow would keep the graft closed. Subacute to chronic flush occlusion of the new SVG-LAD.   Moderate to Severely Elevated LVEDP c/w Diastolic CHF (with ongoing CP & SOB - Acute on Chronic)         Glenetta Hew, MD     Antiplatelet/Anticoag Recommend Aspirin 81mg  daily for moderate CAD. At a minimum aspirin, but would also be reasonable to use Plavix alone prophylactically.  Discharge Date Anticipated discharge date to be determined. Initial plan to discharge canceled.  Patient had significant dyspnea with rales and hypoxia upon arrival to the Short Stay Unit.  This was potential anticipated and post-cath orders with Lasix 40 mg ordered and administered.  Patient was placed on oxygen and symptomatically improved after diuresis.  Will admit as outpatient extended recovery for overnight monitoring.  This will also allow [place] to potentially titrate antianginal medications while he is inpatient. Unlikely that he would be candidate for a third CABG.      Coronary Diagrams   Diagnostic Dominance: Right        EKG:  EKG is not ordered today. Recent Labs: 08/31/2020: TSH 2.180 10/25/2020: BNP 599.9 01/07/2021: ALT 21; BUN 16; Creatinine, Ser 0.83; Hemoglobin 13.9; Platelets 270; Potassium 5.4; Sodium 138  Recent Lipid Panel    Component Value Date/Time   CHOL 140 08/31/2020 0848   TRIG 156 (H) 08/31/2020 0848   HDL 50 08/31/2020 0848   CHOLHDL 2.8 08/31/2020 0848   CHOLHDL 2.7 11/14/2016 0911   VLDL 27 11/14/2016 0911   LDLCALC  63 08/31/2020 0848     Physical Exam:    VS:  BP (!) 98/56 (BP Location: Left Arm, Patient Position: Sitting, Cuff Size: Normal)   Pulse 66   Ht 5\' 6"  (1.676 m)   Wt 166 lb (75.3 kg)   SpO2 98%   BMI 26.79 kg/m     Wt Readings from Last 3 Encounters:  01/14/21 166 lb (75.3 kg)  01/07/21 169 lb 8 oz (76.9 kg)  01/04/21 168 lb 9.6 oz (76.5 kg)     GEN:  Well nourished, well developed in no acute distress HEENT: Normal NECK: No JVD; No carotid bruits LYMPHATICS: No lymphadenopathy CARDIAC: RRR, no murmurs, rubs, gallops RESPIRATORY:  Clear to auscultation without rales, wheezing or rhonchi  ABDOMEN: Soft, non-tender, non-distended MUSCULOSKELETAL:  No edema; No deformity  SKIN: Warm and dry NEUROLOGIC:  Alert and oriented x 3 PSYCHIATRIC:  Normal affect   ASSESSMENT:    1. Chronic systolic heart failure ([place])   2. Ischemic cardiomyopathy   3. Hyperlipidemia LDL goal <70   4. Coronary artery disease of native artery of native heart with stable angina pectoris (H[facility])    PLAN:    In order of problems listed above:  HFrEF ICM Echo showed reduced EF 30-35% in the setting of NSTEMI. He is taking lasix 40mg  BID. On exam he appears euvolemic, has chronic pedal edema on the left side. He is on Coreg 12.5mg  BID. Bps soft, limiting GDMT. Imdur was held for 10 days to see if BPS would improve enough to tolerate Losartan. Bps were still soft and patient self-resumed Imdur to improve endurance at cardiac rehab. I will order repeat echo to reassess pump function.   CAD with stable angina Patient is progressing well with Cardiac rehab. He self-resumed Imdur 15mg  daily since he felt benefit from it during exertion at cardiac rehab. No further work-up  indicated at this time. Continue Aspirin, Plavix, statin, BB, Zetia, Ranexa.  HLD LDL 63 08/1009. Continue Zetia and Crestor.  Disposition: Follow up in 3 month(s) with MD/APP    Signed, Renesme Kerrigan Ninfa Meeker, PA-C  01/14/2021 12:27 PM     [facility]

## 2021-01-15 ENCOUNTER — Other Ambulatory Visit: Payer: Medicare Other

## 2021-01-15 DIAGNOSIS — E1159 Type 2 diabetes mellitus with other circulatory complications: Secondary | ICD-10-CM | POA: Diagnosis not present

## 2021-01-15 DIAGNOSIS — E119 Type 2 diabetes mellitus without complications: Secondary | ICD-10-CM | POA: Diagnosis not present

## 2021-01-15 DIAGNOSIS — I152 Hypertension secondary to endocrine disorders: Secondary | ICD-10-CM | POA: Diagnosis not present

## 2021-01-15 DIAGNOSIS — R052 Subacute cough: Secondary | ICD-10-CM

## 2021-01-15 NOTE — Patient Instructions (Signed)
Discharge Patient Instructions  Patient Details  Name: Jacob Harrison MRN: [id number] Date of Birth: 04-25-1940 Referring Provider:  Nelva Bush, MD   Number of Visits: 66  Reason for Discharge:  Patient reached a stable level of exercise. Patient independent in their exercise. Patient has met program and personal goals.  Smoking History:  Social History   Tobacco Use  Smoking Status Never  Smokeless Tobacco Never    Diagnosis:  NSTEMI (non-ST elevation myocardial infarction) [facility])  Initial Exercise Prescription:  Initial Exercise Prescription - 11/08/20 1100       Date of Initial Exercise RX and Referring Provider   Date 11/08/20    Referring Provider End, Harrell Gave MD      Treadmill   MPH 1.3    Grade 0    Minutes 15    METs 2      Recumbant Bike   Level 1    RPM 60    Watts 10    Minutes 15    METs 1.3      NuStep   Level 1    SPM 80    Minutes 15    METs 1.3      Biostep-RELP   Level 1    SPM 50    Minutes 15    METs 1.3      Prescription Details   Frequency (times per week) 3    Duration Progress to 30 minutes of continuous aerobic without signs/symptoms of physical distress      Intensity   THRR 40-80% of Max Heartrate 92-126    Ratings of Perceived Exertion 11-13    Perceived Dyspnea 0-4      Progression   Progression Continue to progress workloads to maintain intensity without signs/symptoms of physical distress.      Resistance Training   Training Prescription Yes    Weight 3 lb    Reps 10-15             Discharge Exercise Prescription (Final Exercise Prescription Changes):  Exercise Prescription Changes - 01/14/21 1300       Response to Exercise   Blood Pressure (Admit) 94/52    Blood Pressure (Exercise) 138/64    Blood Pressure (Exit) 118/66    Heart Rate (Admit) 57 bpm    Heart Rate (Exercise) 80 bpm    Heart Rate (Exit) 85 bpm    Oxygen Saturation (Admit) 97 %    Oxygen Saturation (Exercise) 96 %     Oxygen Saturation (Exit) 96 %    Rating of Perceived Exertion (Exercise) 13    Perceived Dyspnea (Exercise) 0    Symptoms none    Duration Continue with 30 min of aerobic exercise without signs/symptoms of physical distress.    Intensity THRR unchanged      Progression   Progression Continue to progress workloads to maintain intensity without signs/symptoms of physical distress.    Average METs 2.15      Resistance Training   Training Prescription Yes    Weight 3 lb    Reps 10-15      Interval Training   Interval Training No      NuStep   Level 1    Minutes 15    METs 2.3      Biostep-RELP   Level 2    Minutes 15    METs 2      Home Exercise Plan   Plans to continue exercise at Home (comment)   walking   Frequency Add  2 additional days to program exercise sessions.   start with 1 day   Initial Home Exercises Provided 12/03/20             Functional Capacity:  6 Minute Walk     Row Name 11/08/20 1119 01/04/21 0958       6 Minute Walk   Phase Initial Discharge    Distance 790 feet 1000 feet    Distance % Change -- 26.6 %    Distance Feet Change -- 210 ft    Walk Time 6 minutes 6 minutes    # of Rest Breaks 0 0    MPH 1.49 1.89    METS 1.29 1.96    RPE 13 13    Perceived Dyspnea  2 --    VO2 Peak 4.53 6.88    Symptoms Yes (comment) Yes (comment)    Comments Slight SOB- resolved with rest --    Resting HR 59 bpm 62 bpm    Resting BP 96/62 126/68    Resting Oxygen Saturation  97 % 98 %    Exercise Oxygen Saturation  during 6 min walk 97 % 98 %    Max Ex. HR 85 bpm 93 bpm    Max Ex. BP 112/64 136/72    2 Minute Post BP 108/62 --             Nutrition & Weight - Outcomes:  Pre Biometrics - 11/08/20 1113       Pre Biometrics   Height 5' 6.1" (1.679 m)    Weight 176 lb 3.2 oz (79.9 kg)    BMI (Calculated) 28.35    Single Leg Stand 11.6 seconds             Post Biometrics - 01/04/21 0959        Post  Biometrics   Height 5' 6.1"  (1.679 m)    Weight 168 lb 9.6 oz (76.5 kg)    BMI (Calculated) 27.13    Single Leg Stand 2.7 seconds             Nutrition:  Nutrition Therapy & Goals - 11/26/20 1048       Nutrition Therapy   Diet heart healthy, low Na, diabetes friendly    Drug/Food Interactions Statins/Certain Fruits    Protein (specify units) 60g    Fiber 30 grams    Whole Grain Foods 3 servings    Saturated Fats 12 max. grams    Fruits and Vegetables 8 servings/day    Sodium 1.5 grams      Personal Nutrition Goals   Nutrition Goal ST: try adding prunes, maintain fluid intake, consult MD regarding low dose magnesium LT: maintain changes made, increase variety of vegetables (eat the rainbow in a week), add heart healthy fats such as nuts/seeds    Comments B: bowl of cereal with fruit or fruit on it's own or egg or plant based sausage. Drinks most of the day, snacks on fruit. L: chicken sandwich or tomato sandiwch (whole wheat bread- ezekiel bread) or leftovers D: airfries food a lot. piece of meat like lean beef or chicken and greens with brown rice or wheat pasta with tomato sauce. He cut out salt and that was very hard for him. A1C 6.1. He feels he is eating more sugar than he did before - natural sugar from fruit for the most part. They have a garden. He feels his calories has decreased - lost 30 pounds in 5 months. He has been  constipated - 2 quarts water, 2 12 oz sodas (diet). he feels the lasix (2 pills is causing his constipation - this is a common side effect). Suggested prunes, maintain fluid with increased fiber, a consulting his doctor regarding a low dose of magnesium which can relax his bowel. Discussed inclusion of heart healthy fats and how to flavor food as he has made so many changes and is struggling with enjoying some of them.Discussed heart healthy eating and diabetes friendly eating.      Intervention Plan   Intervention Prescribe, educate and counsel regarding individualized specific dietary  modifications aiming towards targeted core components such as weight, hypertension, lipid management, diabetes, heart failure and other comorbidities.;Nutrition handout(s) given to patient.    Expected Outcomes Short Term Goal: Understand basic principles of dietary content, such as calories, fat, sodium, cholesterol and nutrients.;Short Term Goal: A plan has been developed with personal nutrition goals set during dietitian appointment.;Long Term Goal: Adherence to prescribed nutrition plan.              Goals reviewed with patient; copy given to patient.

## 2021-01-16 ENCOUNTER — Other Ambulatory Visit: Payer: Self-pay

## 2021-01-16 DIAGNOSIS — I214 Non-ST elevation (NSTEMI) myocardial infarction: Secondary | ICD-10-CM | POA: Diagnosis not present

## 2021-01-16 LAB — COMPREHENSIVE METABOLIC PANEL
ALT: 19 IU/L (ref 0–44)
AST: 19 IU/L (ref 0–40)
Albumin/Globulin Ratio: 2.5 — ABNORMAL HIGH (ref 1.2–2.2)
Albumin: 4.8 g/dL — ABNORMAL HIGH (ref 3.7–4.7)
Alkaline Phosphatase: 84 IU/L (ref 44–121)
BUN/Creatinine Ratio: 16 (ref 10–24)
BUN: 14 mg/dL (ref 8–27)
Bilirubin Total: 0.6 mg/dL (ref 0.0–1.2)
CO2: 27 mmol/L (ref 20–29)
Calcium: 9.8 mg/dL (ref 8.6–10.2)
Chloride: 97 mmol/L (ref 96–106)
Creatinine, Ser: 0.86 mg/dL (ref 0.76–1.27)
Globulin, Total: 1.9 g/dL (ref 1.5–4.5)
Glucose: 104 mg/dL — ABNORMAL HIGH (ref 70–99)
Potassium: 4.4 mmol/L (ref 3.5–5.2)
Sodium: 138 mmol/L (ref 134–144)
Total Protein: 6.7 g/dL (ref 6.0–8.5)
eGFR: 89 mL/min/{1.73_m2} (ref >59)

## 2021-01-16 NOTE — Progress Notes (Signed)
Daily Session Note  Patient Details  Name: Jacob Harrison MRN: [id number] Date of Birth: 1940-04-20 Referring Provider:   Flowsheet Row Cardiac Rehab from 11/08/2020 in [facility] Cardiac [facility] Pulmonary Rehab  Referring Provider End, Harrell Gave MD       Encounter Date: 01/16/2021  Check In:  Session Check In - 01/16/21 1034       Check-In   Supervising physician immediately available to respond to emergencies See telemetry face sheet for immediately available ER MD    Location [facility]-Cardiac & Pulmonary Rehab    Staff Present Birdie Sons, MPA, Elveria Rising, BA, ACSM CEP, Exercise Physiologist;Joseph Tessie Fass, [place]    Virtual Visit No    Medication changes reported     No    Fall or balance concerns reported    No    Tobacco Cessation No Change    Warm-up and Cool-down Performed on first and last piece of equipment    Resistance Training Performed Yes    VAD Patient? No    PAD/SET Patient? No      Pain Assessment   Currently in Pain? No/denies                Social History   Tobacco Use  Smoking Status Never  Smokeless Tobacco Never    Goals Met:  Independence with exercise equipment Exercise tolerated well No report of concerns or symptoms today Strength training completed today  Goals Unmet:  Not Applicable  Comments:  Jacob Harrison graduated today from  rehab with 36 sessions completed.  Details of the patient's exercise prescription and what He needs to do in order to continue the prescription and progress were discussed with patient.  Patient was given a copy of prescription and goals.  Patient verbalized understanding.  Jacob Harrison plans to continue to exercise by going to a fitness center.    Dr. Emily Filbert is Medical Director for [facility].  Dr. Ottie Glazier is Medical Director for [facility] Pulmonary Rehabilitation.

## 2021-01-16 NOTE — Progress Notes (Signed)
Discharge Progress Report  Patient Details  Name: Jacob Harrison MRN: [id number] Date of Birth: October 01, 1940 Referring Provider:   Flowsheet Row Cardiac Rehab from 11/08/2020 in [facility] Cardiac and Pulmonary Rehab  Referring Provider End, Harrell Gave MD        Number of Visits: 36  Reason for Discharge:  Patient reached a stable level of exercise. Patient independent in their exercise. Patient has met program and personal goals.  Smoking History:  Social History   Tobacco Use  Smoking Status Never  Smokeless Tobacco Never    Diagnosis:  NSTEMI (non-ST elevation myocardial infarction) ([place])  ADL [facility]:   Initial Exercise Prescription:  Initial Exercise Prescription - 11/08/20 1100       Date of Initial Exercise RX and Referring Provider   Date 11/08/20    Referring Provider End, Harrell Gave MD      Treadmill   MPH 1.3    Grade 0    Minutes 15    METs 2      Recumbant Bike   Level 1    RPM 60    Watts 10    Minutes 15    METs 1.3      NuStep   Level 1    SPM 80    Minutes 15    METs 1.3      Biostep-RELP   Level 1    SPM 50    Minutes 15    METs 1.3      Prescription Details   Frequency (times per week) 3    Duration Progress to 30 minutes of continuous aerobic without signs/symptoms of physical distress      Intensity   THRR 40-80% of Max Heartrate 92-126    Ratings of Perceived Exertion 11-13    Perceived Dyspnea 0-4      Progression   Progression Continue to progress workloads to maintain intensity without signs/symptoms of physical distress.      Resistance Training   Training Prescription Yes    Weight 3 lb    Reps 10-15             Discharge Exercise Prescription (Final Exercise Prescription Changes):  Exercise Prescription Changes - 01/14/21 1300       Response to Exercise   Blood Pressure (Admit) 94/52    Blood Pressure (Exercise) 138/64    Blood Pressure (Exit) 118/66    Heart Rate (Admit) 57 bpm    Heart Rate  (Exercise) 80 bpm    Heart Rate (Exit) 85 bpm    Oxygen Saturation (Admit) 97 %    Oxygen Saturation (Exercise) 96 %    Oxygen Saturation (Exit) 96 %    Rating of Perceived Exertion (Exercise) 13    Perceived Dyspnea (Exercise) 0    Symptoms none    Duration Continue with 30 min of aerobic exercise without signs/symptoms of physical distress.    Intensity THRR unchanged      Progression   Progression Continue to progress workloads to maintain intensity without signs/symptoms of physical distress.    Average METs 2.15      Resistance Training   Training Prescription Yes    Weight 3 lb    Reps 10-15      Interval Training   Interval Training No      NuStep   Level 1    Minutes 15    METs 2.3      Biostep-RELP   Level 2    Minutes 15    METs  2      Home Exercise Plan   Plans to continue exercise at Home (comment)   walking   Frequency Add 2 additional days to program exercise sessions.   start with 1 day   Initial Home Exercises Provided 12/03/20             Functional Capacity:  6 Minute Walk     Row Name 11/08/20 1119 01/04/21 0958       6 Minute Walk   Phase Initial Discharge    Distance 790 feet 1000 feet    Distance % Change -- 26.6 %    Distance Feet Change -- 210 ft    Walk Time 6 minutes 6 minutes    # of Rest Breaks 0 0    MPH 1.49 1.89    METS 1.29 1.96    RPE 13 13    Perceived Dyspnea  2 --    VO2 Peak 4.53 6.88    Symptoms Yes (comment) Yes (comment)    Comments Slight SOB- resolved with rest --    Resting HR 59 bpm 62 bpm    Resting BP 96/62 126/68    Resting Oxygen Saturation  97 % 98 %    Exercise Oxygen Saturation  during 6 min walk 97 % 98 %    Max Ex. HR 85 bpm 93 bpm    Max Ex. BP 112/64 136/72    2 Minute Post BP 108/62 --             Psychological, QOL, Others - Outcomes: PHQ 2/9: Depression screen [facility] 2/9 01/09/2021 01/07/2021 12/03/2020 11/08/2020 09/04/2020  Decreased Interest '1 1 1 1 ' 0  Down, Depressed, Hopeless 0 0 0  0 0  PHQ - 2 Score '1 1 1 1 ' 0  Altered sleeping 0 0 0 1 1  Tired, decreased energy '2 1 1 2 1  ' Change in appetite 0 1 0 1 0  Feeling bad or failure about yourself  1 0 0 0 0  Trouble concentrating '1 1 1 1 ' 0  Moving slowly or fidgety/restless 0 0 '1 1 1  ' Suicidal thoughts 0 0 0 0 0  PHQ-9 Score '5 4 4 7 3  ' Difficult doing work/chores Very difficult Somewhat difficult Somewhat difficult Somewhat difficult Somewhat difficult  Some recent data might be hidden    Quality of Life:  Quality of Life - 01/09/21 0949       Quality of Life Scores   Health/Function Pre 16.27 %    Health/Function Post 21.47 %    Health/Function % Change 31.96 %    Socioeconomic Pre 25 %    Socioeconomic Post 27.56 %    Socioeconomic % Change  10.24 %    Psych/Spiritual Pre 25.57 %    Psych/Spiritual Post 26.57 %    Psych/Spiritual % Change 3.91 %    Family Pre 28.5 %    Family Post 30 %    Family % Change 5.26 %    GLOBAL Pre 21.87 %    GLOBAL Post 25.1 %    GLOBAL % Change 14.77 %             P Nutrition & Weight - Outcomes:  Pre Biometrics - 11/08/20 1113       Pre Biometrics   Height 5' 6.1" (1.679 m)    Weight 176 lb 3.2 oz (79.9 kg)    BMI (Calculated) 28.35    Single Leg Stand 11.6 seconds  Post Biometrics - 01/04/21 0959        Post  Biometrics   Height 5' 6.1" (1.679 m)    Weight 168 lb 9.6 oz (76.5 kg)    BMI (Calculated) 27.13    Single Leg Stand 2.7 seconds             Nutrition:  Nutrition Therapy & Goals - 11/26/20 1048       Nutrition Therapy   Diet heart healthy, low Na, diabetes friendly    Drug/Food Interactions Statins/Certain Fruits    Protein (specify units) 60g    Fiber 30 grams    Whole Grain Foods 3 servings    Saturated Fats 12 max. grams    Fruits and Vegetables 8 servings/day    Sodium 1.5 grams      Personal Nutrition Goals   Nutrition Goal ST: try adding prunes, maintain fluid intake, consult MD regarding low dose magnesium  LT: maintain changes made, increase variety of vegetables (eat the rainbow in a week), add heart healthy fats such as nuts/seeds    Comments B: bowl of cereal with fruit or fruit on it's own or egg or plant based sausage. Drinks most of the day, snacks on fruit. L: chicken sandwich or tomato sandiwch (whole wheat bread- ezekiel bread) or leftovers D: airfries food a lot. piece of meat like lean beef or chicken and greens with brown rice or wheat pasta with tomato sauce. He cut out salt and that was very hard for him. A1C 6.1. He feels he is eating more sugar than he did before - natural sugar from fruit for the most part. They have a garden. He feels his calories has decreased - lost 30 pounds in 5 months. He has been constipated - 2 quarts water, 2 12 oz sodas (diet). he feels the lasix (2 pills is causing his constipation - this is a common side effect). Suggested prunes, maintain fluid with increased fiber, a consulting his doctor regarding a low dose of magnesium which can relax his bowel. Discussed inclusion of heart healthy fats and how to flavor food as he has made so many changes and is struggling with enjoying some of them.Discussed heart healthy eating and diabetes friendly eating.      Intervention Plan   Intervention Prescribe, educate and counsel regarding individualized specific dietary modifications aiming towards targeted core components such as weight, hypertension, lipid management, diabetes, heart failure and other comorbidities.;Nutrition handout(s) given to patient.    Expected Outcomes Short Term Goal: Understand basic principles of dietary content, such as calories, fat, sodium, cholesterol and nutrients.;Short Term Goal: A plan has been developed with personal nutrition goals set during dietitian appointment.;Long Term Goal: Adherence to prescribed nutrition plan.             Nutrition Discharge:   Education Questionnaire Score:  Knowledge Questionnaire Score - 01/09/21 0948        Knowledge Questionnaire Score   Pre Score 25/26: Angina    Post Score 25/26             Goals reviewed with patient; copy given to patient.

## 2021-01-16 NOTE — Progress Notes (Signed)
Cardiac Individual Treatment Plan  Patient Details  Name: Jacob Harrison MRN: [id number] Date of Birth: Aug 30, 1940 Referring Provider:   Flowsheet Row Cardiac Rehab from 11/08/2020 in [facility] Cardiac and Pulmonary Rehab  Referring Provider End, Harrell Gave MD       Initial Encounter Date:  Flowsheet Row Cardiac Rehab from 11/08/2020 in [facility] Cardiac and Pulmonary Rehab  Date 11/08/20       Visit Diagnosis: NSTEMI (non-ST elevation myocardial infarction) [facility])  Patient's Home Medications on Admission:  Current Outpatient Medications:    acetaminophen (TYLENOL) 500 MG tablet, Take 1,000 mg by mouth every 6 (six) hours as needed for moderate pain or headache., Disp: , Rfl:    amoxicillin-clavulanate (AUGMENTIN) 875-125 MG tablet, Take 1 tablet by mouth 2 (two) times daily., Disp: 10 tablet, Rfl: 0   aspirin EC 81 MG tablet, Take 1 tablet (81 mg total) by mouth daily., Disp: , Rfl:    azithromycin (ZITHROMAX) 250 MG tablet, Take 2 tablets on day 1, then 1 tablet daily on days 2 through 5, Disp: 6 tablet, Rfl: 0   carvedilol (COREG) 12.5 MG tablet, TAKE 1 TABLET (12.5 MG TOTAL) BY MOUTH 2 (TWO) TIMES DAILY WITH A MEAL., Disp: 180 tablet, Rfl: 0   Cholecalciferol (VITAMIN D-3 PO), Take 800 Units by mouth daily., Disp: , Rfl:    clopidogrel (PLAVIX) 75 MG tablet, Take 1 tablet (75 mg total) by mouth daily., Disp: 90 tablet, Rfl: 1   Coenzyme Q10 (CO Q-10) 200 MG CAPS, Take 200 mg by mouth daily. , Disp: , Rfl:    cyanocobalamin 500 MCG tablet, Take 500 mcg by mouth daily., Disp: , Rfl:    diclofenac Sodium (VOLTAREN) 1 % GEL, Apply 2 g topically 2 (two) times daily as needed., Disp: 120 g, Rfl: 0   ezetimibe (ZETIA) 10 MG tablet, TAKE 1 TABLET BY MOUTH EVERY DAY, Disp: 90 tablet, Rfl: 0   finasteride (PROSCAR) 5 MG tablet, TAKE 1 TABLET BY MOUTH EVERY DAY, Disp: 90 tablet, Rfl: 0   furosemide (LASIX) 40 MG tablet, TAKE 1 TABLET BY MOUTH TWICE A DAY, Disp: 60 tablet, Rfl: 0    Glucosamine-Chondroit-Vit C-Mn (GLUCOSAMINE 1500 COMPLEX) CAPS, Take 1 capsule by mouth daily., Disp: , Rfl:    isosorbide mononitrate (IMDUR) 30 MG 24 hr tablet, TAKE 0.5 TABLETS (15 MG TOTAL) BY MOUTH AT BEDTIME., Disp: 15 tablet, Rfl: 0   Lancets (ONETOUCH ULTRASOFT) lancets, Use to check blood sugars fasting daily, Disp: 100 each, Rfl: 12   metFORMIN (GLUCOPHAGE) 500 MG tablet, 1/2 tablet by mouth daily with food, Disp: 90 tablet, Rfl: 2   Multiple Vitamins-Minerals (PRESERVISION AREDS 2 PO), Take 1 capsule by mouth 2 (two) times a day., Disp: , Rfl:    nitroGLYCERIN (NITROSTAT) 0.4 MG SL tablet, Place 1 tablet (0.4 mg total) under the tongue every 5 (five) minutes as needed., Disp: 25 tablet, Rfl: 3   ranolazine (RANEXA) 1000 MG SR tablet, TAKE 1 TABLET BY MOUTH TWICE A DAY, Disp: 180 tablet, Rfl: 0   rosuvastatin (CRESTOR) 20 MG tablet, TAKE 1 TABLET BY MOUTH EVERYDAY AT BEDTIME, Disp: 90 tablet, Rfl: 0  Past Medical History: Past Medical History:  Diagnosis Date   (HFpEF) heart failure with preserved ejection fraction ([place])    a. 10/2016 Echo: EF 65-70%, mild LVH, mildly dil LA. RV fxn nl.   Arthritis    BPH (benign prostatic hyperplasia)    Coronary artery disease    a. 2001 CABG x 3 [facility]): LIMA->LAD,  VG->LCX, VG->RPDA; b. 04/05/2010 Cath ([place]): LM 40m LAD 1070mLCX 4037mCA 100p, LIMA->LAD atretic, VG->LCX 100, VG->RCA 80ost, 70d; c. 04/2010 Redo CABG x 2 ([place]): VG->LAD, VG->RPDA; d. 10/2012 Cath (FrSharlene MottsLM 20-30, LAD 100/95-51m23mX 40-18m,41m 100p, 80-100d, RPDA 99, RPL1 95, VG->LAD ok, VG->RPDA ok, EF 67%; e. 11/2016 MV: EF 48%, No ischemia.   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   Hyperlipidemia    Hypertension    Scoliosis    Spinal stenosis     Tobacco Use: Social History   Tobacco Use  Smoking Status Never  Smokeless Tobacco Never    Labs: Recent Review Flowsheet Data     Labs for ITP Cardiac and Pulmonary Rehab Latest Ref Rng &  Units 05/17/2019 08/15/2019 04/16/2020 08/31/2020 01/07/2021   Cholestrol 100 - 199 mg/dL - 153 160 140 -   LDLCALC 0 - 99 mg/dL - 71 69 63 -   HDL >39 mg/dL - 67 69 50 -   Trlycerides 0 - 149 mg/dL - 76 129 156(H) -   Hemoglobin A1c 4.0 - 5.6 % 5.8(A) 5.5 6.1(H) 6.1(H) 5.3        Exercise Target Goals: Exercise Program Goal: Individual exercise prescription set using results from initial 6 min walk test and THRR while considering  patient's activity barriers and safety.   Exercise Prescription Goal: Initial exercise prescription builds to 30-45 minutes a day of aerobic activity, 2-3 days per week.  Home exercise guidelines will be given to patient during program as part of exercise prescription that the participant will acknowledge.   Education: Aerobic Exercise: - Group verbal and visual presentation on the components of exercise prescription. Introduces F.I.T.T principle from ACSM for exercise prescriptions.  Reviews F.I.T.T. principles of aerobic exercise including progression. Written material given at graduation. Flowsheet Row [facility]  Date 11/28/20  Educator JH  I[facility] LL[facility]ruction Review Code 1- Verbalizes Understanding       Education: Resistance Exercise: - Group verbal and visual presentation on the components of exercise prescription. Introduces F.I.T.T principle from ACSM for exercise prescriptions  Reviews F.I.T.T. principles of resistance exercise including progression. Written material given at graduation. Flowsheet Row [facility]  Date 12/05/20  Educator JH  IWilliamson Surgery Centertruction Review Code 1- Verbalizes Understanding        Education: Exercise & Equipment Safety: - Individual verbal instruction and demonstration of equipment use and safety with use of the equipment. Flowsheet Row [facility]  Education need  identified 11/08/20  Date 11/08/20  Educator KL  IHoot Owltruction Review Code 1- Verbalizes Understanding       Education: Exercise Physiology & General Exercise Guidelines: - Group verbal and written instruction with models to review the exercise physiology of the cardiovascular system and associated critical values. Provides general exercise guidelines with specific guidelines to those with heart or lung disease.  Flowsheet [facility]  Date 11/21/20  Educator AS  Instruction Review Code 1- Verbalizes Understanding       Education: Flexibility, Balance, Mind/Body Relaxation: - Group verbal and visual presentation with interactive activity on the components of exercise prescription. Introduces F.I.T.T principle from ACSM for exercise prescriptions. Reviews F.I.T.T. principles of flexibility and balance exercise training including progression. Also discusses the mind body connection.  Reviews various relaxation techniques to help reduce and manage  stress (i.e. Deep breathing, progressive muscle relaxation, and visualization). Balance handout provided to take home. Written material given at graduation. Flowsheet Row Cardiac Rehab from 01/16/2021 in [facility] Cardiac and Pulmonary Rehab  Date 12/12/20  Educator AS  Instruction Review Code 1- Verbalizes Understanding       Activity Barriers & Risk Stratification:  Activity Barriers & Cardiac Risk Stratification - 11/08/20 1113       Activity Barriers & Cardiac Risk Stratification   Activity Barriers Deconditioning;Muscular Weakness;Other (comment)    Comments Diabetic neuropathy, torn left rotator cuff    Cardiac Risk Stratification High             6 Minute Walk:  6 Minute Walk     Row Name 11/08/20 1119 01/04/21 0958       6 Minute Walk   Phase Initial Discharge    Distance 790 feet 1000 feet    Distance % Change -- 26.6 %    Distance Feet Change -- 210 ft    Walk  Time 6 minutes 6 minutes    # of Rest Breaks 0 0    MPH 1.49 1.89    METS 1.29 1.96    RPE 13 13    Perceived Dyspnea  2 --    VO2 Peak 4.53 6.88    Symptoms Yes (comment) Yes (comment)    Comments Slight SOB- resolved with rest --    Resting HR 59 bpm 62 bpm    Resting BP 96/62 126/68    Resting Oxygen Saturation  97 % 98 %    Exercise Oxygen Saturation  during 6 min walk 97 % 98 %    Max Ex. HR 85 bpm 93 bpm    Max Ex. BP 112/64 136/72    2 Minute Post BP 108/62 --             Oxygen Initial Assessment:   Oxygen Re-Evaluation:   Oxygen Discharge (Final Oxygen Re-Evaluation):   Initial Exercise Prescription:  Initial Exercise Prescription - 11/08/20 1100       Date of Initial Exercise RX and Referring Provider   Date 11/08/20    Referring Provider End, Harrell Gave MD      Treadmill   MPH 1.3    Grade 0    Minutes 15    METs 2      Recumbant Bike   Level 1    RPM 60    Watts 10    Minutes 15    METs 1.3      NuStep   Level 1    SPM 80    Minutes 15    METs 1.3      Biostep-RELP   Level 1    SPM 50    Minutes 15    METs 1.3      Prescription Details   Frequency (times per week) 3    Duration Progress to 30 minutes of continuous aerobic without signs/symptoms of physical distress      Intensity   THRR 40-80% of Max Heartrate 92-126    Ratings of Perceived Exertion 11-13    Perceived Dyspnea 0-4      Progression   Progression Continue to progress workloads to maintain intensity without signs/symptoms of physical distress.      Resistance Training   Training Prescription Yes    Weight 3 lb    Reps 10-15             Perform Capillary Blood Glucose checks as  needed.  Exercise Prescription Changes:   Exercise Prescription Changes     Row Name 11/08/20 1100 11/20/20 1400 12/03/20 1000 12/17/20 0800 12/31/20 1600     Response to Exercise   Blood Pressure (Admit) 96/92 102/50 98/62 128/62 112/60   Blood Pressure (Exercise) 112/64  -- 114/66 132/60 --   Blood Pressure (Exit) 108/62 106/64 94/56 122/62 116/64   Heart Rate (Admit) 59 bpm 70 bpm 61 bpm 57 bpm 61 bpm   Heart Rate (Exercise) 85 bpm 92 bpm 109 bpm 106 bpm 90 bpm   Heart Rate (Exit) 70 bpm 67 bpm 76 bpm 65 bpm 84 bpm   Oxygen Saturation (Admit) 97 % -- -- -- 95 %   Oxygen Saturation (Exercise) 97 % -- -- -- 94 %   Oxygen Saturation (Exit) 97 % -- -- -- 94 %   Rating of Perceived Exertion (Exercise) '13 11 15 15 13   ' Perceived Dyspnea (Exercise) 2 -- -- -- 1   Symptoms Slight SOB -- none none none   Comments walk test results third day -- -- --   Duration -- Progress to 30 minutes of  aerobic without signs/symptoms of physical distress Continue with 30 min of aerobic exercise without signs/symptoms of physical distress. Continue with 30 min of aerobic exercise without signs/symptoms of physical distress. Continue with 30 min of aerobic exercise without signs/symptoms of physical distress.   Intensity -- THRR unchanged THRR unchanged THRR unchanged THRR unchanged     Progression   Progression -- Continue to progress workloads to maintain intensity without signs/symptoms of physical distress. Continue to progress workloads to maintain intensity without signs/symptoms of physical distress. Continue to progress workloads to maintain intensity without signs/symptoms of physical distress. Continue to progress workloads to maintain intensity without signs/symptoms of physical distress.   Average METs -- 2.1 2.39 2.3 2.25     Resistance Training   Training Prescription -- Yes Yes Yes Yes   Weight -- 3 lb 3 lb 3 lb 3 lb   Reps -- 10-15 10-15 10-15 10-15     Interval Training   Interval Training -- -- No No No     Treadmill   MPH -- -- 1.3 1.3 1.5   Grade -- -- 0 0 0   Minutes -- -- '15 15 15   ' METs -- -- 2 2 2.15     Recumbant Bike   Level -- -- '1 2 3   ' Watts -- -- 9 15 --   Minutes -- -- '15 15 15   ' METs -- -- 2.39 2.6 --     NuStep   Level -- 1 3 -- 4    SPM -- 80 -- -- --   Minutes -- 15 15 -- 15   METs -- 2 2.6 -- 2.6     Biostep-RELP   Level -- 1 2 -- 2   SPM -- 50 -- -- --   Minutes -- 15 15 -- 15   METs -- 2.2 2 -- 2.5     Home Exercise Plan   Plans to continue exercise at -- -- Home (comment)  walking Home (comment)  walking Home (comment)  walking   Frequency -- -- Add 2 additional days to program exercise sessions.  start with 1 day Add 2 additional days to program exercise sessions.  start with 1 day Add 2 additional days to program exercise sessions.  start with 1 day   Initial Home Exercises Provided -- -- 12/03/20 12/03/20 12/03/20  Row Name 01/14/21 1300             Response to Exercise   Blood Pressure (Admit) 94/52       Blood Pressure (Exercise) 138/64       Blood Pressure (Exit) 118/66       Heart Rate (Admit) 57 bpm       Heart Rate (Exercise) 80 bpm       Heart Rate (Exit) 85 bpm       Oxygen Saturation (Admit) 97 %       Oxygen Saturation (Exercise) 96 %       Oxygen Saturation (Exit) 96 %       Rating of Perceived Exertion (Exercise) 13       Perceived Dyspnea (Exercise) 0       Symptoms none       Duration Continue with 30 min of aerobic exercise without signs/symptoms of physical distress.       Intensity THRR unchanged               Progression   Progression Continue to progress workloads to maintain intensity without signs/symptoms of physical distress.       Average METs 2.15               Resistance Training   Training Prescription Yes       Weight 3 lb       Reps 10-15               Interval Training   Interval Training No               NuStep   Level 1       Minutes 15       METs 2.3               Biostep-RELP   Level 2       Minutes 15       METs 2               Home Exercise Plan   Plans to continue exercise at Home (comment)  walking       Frequency Add 2 additional days to program exercise sessions.  start with 1 day       Initial Home Exercises Provided 12/03/20                 Exercise Comments:   Exercise Comments     Row Name 11/12/20 1012 01/16/21 1036         Exercise Comments First full day of exercise!  Patient was oriented to gym and equipment including functions, settings, policies, and procedures.  Patient's individual exercise prescription and treatment plan were reviewed.  All starting workloads were established based on the results of the 6 minute walk test done at initial orientation visit.  The plan for exercise progression was also introduced and progression will be customized based on patient's performance and goals. Chevy graduated today from  rehab with 36 sessions completed.  Details of the patient's exercise prescription and what He needs to do in order to continue the prescription and progress were discussed with patient.  Patient was given a copy of prescription and goals.  Patient verbalized understanding.  Gardiner plans to continue to exercise by going to a fitness center.               Exercise Goals and Review:   Exercise Goals     Row Name 11/08/20 [phone]  Exercise Goals   Increase Physical Activity Yes       Intervention Provide advice, education, support and counseling about physical activity/exercise needs.;Develop an individualized exercise prescription for aerobic and resistive training based on initial evaluation findings, risk stratification, comorbidities and participant's personal goals.       Expected Outcomes Short Term: Attend rehab on a regular basis to increase amount of physical activity.;Long Term: Add in home exercise to make exercise part of routine and to increase amount of physical activity.;Long Term: Exercising regularly at least 3-5 days a week.       Increase Strength and Stamina Yes       Intervention Provide advice, education, support and counseling about physical activity/exercise needs.;Develop an individualized exercise prescription for aerobic and resistive training  based on initial evaluation findings, risk stratification, comorbidities and participant's personal goals.       Expected Outcomes Short Term: Increase workloads from initial exercise prescription for resistance, speed, and METs.;Short Term: Perform resistance training exercises routinely during rehab and add in resistance training at home;Long Term: Improve cardiorespiratory fitness, muscular endurance and strength as measured by increased METs and functional capacity (6MWT)       Able to understand and use rate of perceived exertion (RPE) scale Yes       Intervention Provide education and explanation on how to use RPE scale       Expected Outcomes Short Term: Able to use RPE daily in rehab to express subjective intensity level;Long Term:  Able to use RPE to guide intensity level when exercising independently       Able to understand and use Dyspnea scale Yes       Intervention Provide education and explanation on how to use Dyspnea scale       Expected Outcomes Short Term: Able to use Dyspnea scale daily in rehab to express subjective sense of shortness of breath during exertion;Long Term: Able to use Dyspnea scale to guide intensity level when exercising independently       Knowledge and understanding of Target Heart Rate Range (THRR) Yes       Intervention Provide education and explanation of THRR including how the numbers were predicted and where they are located for reference       Expected Outcomes Short Term: Able to state/look up THRR;Long Term: Able to use THRR to govern intensity when exercising independently;Short Term: Able to use daily as guideline for intensity in rehab       Able to check pulse independently Yes       Intervention Provide education and demonstration on how to check pulse in carotid and radial arteries.;Review the importance of being able to check your own pulse for safety during independent exercise       Expected Outcomes Short Term: Able to explain why pulse checking  is important during independent exercise;Long Term: Able to check pulse independently and accurately       Understanding of Exercise Prescription Yes       Intervention Provide education, explanation, and written materials on patient's individual exercise prescription       Expected Outcomes Short Term: Able to explain program exercise prescription;Long Term: Able to explain home exercise prescription to exercise independently                Exercise Goals Re-Evaluation :  Exercise Goals Re-Evaluation     Row Name 11/12/20 1013 11/20/20 1409 12/03/20 1016 12/17/20 0837 12/28/20 0955     Exercise Goal Re-Evaluation  Exercise Goals Review Understanding of Exercise Prescription;Knowledge and understanding of Target Heart Rate Range (THRR);Able to understand and use Dyspnea scale;Able to understand and use rate of perceived exertion (RPE) scale Increase Physical Activity;Increase Strength and Stamina Increase Physical Activity;Increase Strength and Stamina;Understanding of Exercise Prescription;Able to check pulse independently;Able to understand and use rate of perceived exertion (RPE) scale;Knowledge and understanding of Target Heart Rate Range (THRR) Increase Physical Activity;Increase Strength and Stamina Increase Physical Activity;Increase Strength and Stamina   Comments Reviewed RPE and dyspnea scales, THR and program prescription with pt today.  Pt voiced understanding and was given a copy of goals to take home. Rich is tolerating exercise well.  he has been able to complete assigned workloads.  Staff will monitor progress. Reviewed home exercise with pt today.  Pt plans to walk and stretch for exercise.  Reviewed THR, pulse, RPE, sign and symptoms, pulse oximetery and when to call 911 or MD. Start with 1 day of walking at home.  Also discussed weather considerations and indoor options.  Pt voiced understanding. Rich is progressing well and has increased to level 2 on RB.  He reaches THR range  and attends consistently.  Staff will monitor progress. Rich will walk 2x/week at home in addition to rehab; 25 minutes with small rest in-between - his driveway is about the same size as our track at rehab (RPE 11 fro first 15 minutes, RPE 13 fro the rest of the time).   Expected Outcomes Short: Use RPE daily to regulate intensity. Long: Follow program prescription in THR. Short:  attend consistently Long:  improve overall stamina Short: Add on 1 day of exercise at home and check HR Long: Continue to increase overall MET level Short: continue to attend consistently Long:  build overall stamina Short: continue to attend consistently, continue walking at home outside of rehab, include stretching weekly Long:  build overall stamina    Row Name 12/31/20 1622 01/14/21 1343           Exercise Goal Re-Evaluation   Exercise Goals Review Increase Physical Activity;Increase Strength and Stamina;Understanding of Exercise Prescription Increase Physical Activity;Increase Strength and Stamina      Comments Denice Paradise has been doing well in rehab.  He is up to level 4 on the NuStep.  We will continue to monitor his progress. Rich will complete rehab in 2 more sessions. He improved post walk by 26.6%!      Expected Outcomes Short: Try 4 lb weights and improve post 6MWT Long: Continue to improve stamina Short: complete HT Long: maintain exercise on his own               Discharge Exercise Prescription (Final Exercise Prescription Changes):  Exercise Prescription Changes - 01/14/21 1300       Response to Exercise   Blood Pressure (Admit) 94/52    Blood Pressure (Exercise) 138/64    Blood Pressure (Exit) 118/66    Heart Rate (Admit) 57 bpm    Heart Rate (Exercise) 80 bpm    Heart Rate (Exit) 85 bpm    Oxygen Saturation (Admit) 97 %    Oxygen Saturation (Exercise) 96 %    Oxygen Saturation (Exit) 96 %    Rating of Perceived Exertion (Exercise) 13    Perceived Dyspnea (Exercise) 0    Symptoms none     Duration Continue with 30 min of aerobic exercise without signs/symptoms of physical distress.    Intensity THRR unchanged      Progression   Progression Continue  to progress workloads to maintain intensity without signs/symptoms of physical distress.    Average METs 2.15      Resistance Training   Training Prescription Yes    Weight 3 lb    Reps 10-15      Interval Training   Interval Training No      NuStep   Level 1    Minutes 15    METs 2.3      Biostep-RELP   Level 2    Minutes 15    METs 2      Home Exercise Plan   Plans to continue exercise at Home (comment)   walking   Frequency Add 2 additional days to program exercise sessions.   start with 1 day   Initial Home Exercises Provided 12/03/20             Nutrition:  Target Goals: Understanding of nutrition guidelines, daily intake of sodium <1531m, cholesterol <2055m calories 30% from fat and 7% or less from saturated fats, daily to have 5 or more servings of fruits and vegetables.  Education: All About Nutrition: -Group instruction provided by verbal, written material, interactive activities, discussions, models, and posters to present general guidelines for heart healthy nutrition including fat, fiber, MyPlate, the role of sodium in heart healthy nutrition, utilization of the nutrition label, and utilization of this knowledge for meal planning. Follow up email sent as well. Written material given at graduation. Flowsheet Row [facility]  Date 12/26/20  Educator [facility]ab[facility] CtrInstruction Review Code 1- Verbalizes Understanding       Biometrics:  Pre Biometrics - 11/08/20 1113       Pre Biometrics   Height 5' 6.1" (1.679 m)    Weight 176 lb 3.2 oz (79.9 kg)    BMI (Calculated) 28.35    Single Leg Stand 11.6 seconds             Post Biometrics - 01/04/21 0959        Post  Biometrics   Height 5' 6.1" (1.679 m)    Weight 168 lb 9.6 oz (76.5 kg)     BMI (Calculated) 27.13    Single Leg Stand 2.7 seconds             Nutrition Therapy Plan and Nutrition Goals:  Nutrition Therapy & Goals - 11/26/20 1048       Nutrition Therapy   Diet heart healthy, low Na, diabetes friendly    Drug/Food Interactions Statins/Certain Fruits    Protein (specify units) 60g    Fiber 30 grams    Whole Grain Foods 3 servings    Saturated Fats 12 max. grams    Fruits and Vegetables 8 servings/day    Sodium 1.5 grams      Personal Nutrition Goals   Nutrition Goal ST: try adding prunes, maintain fluid intake, consult MD regarding low dose magnesium LT: maintain changes made, increase variety of vegetables (eat the rainbow in a week), add heart healthy fats such as nuts/seeds    Comments B: bowl of cereal with fruit or fruit on it's own or egg or plant based sausage. Drinks most of the day, snacks on fruit. L: chicken sandwich or tomato sandiwch (whole wheat bread- ezekiel bread) or leftovers D: airfries food a lot. piece of meat like lean beef or chicken and greens with brown rice or wheat pasta with tomato sauce. He cut out salt and that was very hard for him. A1C  6.1. He feels he is eating more sugar than he did before - natural sugar from fruit for the most part. They have a garden. He feels his calories has decreased - lost 30 pounds in 5 months. He has been constipated - 2 quarts water, 2 12 oz sodas (diet). he feels the lasix (2 pills is causing his constipation - this is a common side effect). Suggested prunes, maintain fluid with increased fiber, a consulting his doctor regarding a low dose of magnesium which can relax his bowel. Discussed inclusion of heart healthy fats and how to flavor food as he has made so many changes and is struggling with enjoying some of them.Discussed heart healthy eating and diabetes friendly eating.      Intervention Plan   Intervention Prescribe, educate and counsel regarding individualized specific dietary modifications  aiming towards targeted core components such as weight, hypertension, lipid management, diabetes, heart failure and other comorbidities.;Nutrition handout(s) given to patient.    Expected Outcomes Short Term Goal: Understand basic principles of dietary content, such as calories, fat, sodium, cholesterol and nutrients.;Short Term Goal: A plan has been developed with personal nutrition goals set during dietitian appointment.;Long Term Goal: Adherence to prescribed nutrition plan.             Nutrition Assessments:  MEDIFICTS Score Key: ?70 Need to make dietary changes  40-70 Heart Healthy Diet ? 40 Therapeutic Level Cholesterol Diet  Flowsheet Row Cardiac Rehab from 01/09/2021 in [facility] Cardiac and Pulmonary Rehab  Picture Your Plate Total Score on Admission 97  Picture Your Plate Total Score on Discharge 82      Picture Your Plate Scores: <40 Unhealthy dietary pattern with much room for improvement. 41-50 Dietary pattern unlikely to meet recommendations for good health and room for improvement. 51-60 More healthful dietary pattern, with some room for improvement.  >60 Healthy dietary pattern, although there may be some specific behaviors that could be improved.    Nutrition Goals Re-Evaluation:  Nutrition Goals Re-Evaluation     [place] Name 12/28/20 0957             Goals   Nutrition Goal ST: adding protein source in AM (peanut butter or boiled egg), one fat source at each meal (liquid plant oil, avocado, nuts/seeds).  LT: maintain current changes       Comment He reports that his constipation was relieved by eating prunes daily, he continues to keep hydrated. He reports doing well with his nutrition, but struggles with his salt intake as many foods are high in salt. He feels he is staying between 1500-2075m. He does not feel well if he eats more than 500 mg of sodium at meal time. He likes spices like cajun spices that he enjoys. He bought a can of mixed nuts, but he wishes it had  more peanuts - suggested he add a bag of unsalted peanuts to his mix. He also uses avocado and olive oil. He has lost 20 lbs since July, he would like to be weight stable.       Expected Outcome ST: continue to read labels to manage salt LT: maintain current changes                Nutrition Goals Discharge (Final Nutrition Goals Re-Evaluation):  Nutrition Goals Re-Evaluation - 12/28/20 0957       Goals   Nutrition Goal ST: adding protein source in AM (peanut butter or boiled egg), one fat source at each meal (liquid plant oil, avocado, nuts/seeds).  LT: maintain current changes    Comment He reports that his constipation was relieved by eating prunes daily, he continues to keep hydrated. He reports doing well with his nutrition, but struggles with his salt intake as many foods are high in salt. He feels he is staying between 1500-2030m. He does not feel well if he eats more than 500 mg of sodium at meal time. He likes spices like cajun spices that he enjoys. He bought a can of mixed nuts, but he wishes it had more peanuts - suggested he add a bag of unsalted peanuts to his mix. He also uses avocado and olive oil. He has lost 20 lbs since July, he would like to be weight stable.    Expected Outcome ST: continue to read labels to manage salt LT: maintain current changes             Psychosocial: Target Goals: Acknowledge presence or absence of significant depression and/or stress, maximize coping skills, provide positive support system. Participant is able to verbalize types and ability to use techniques and skills needed for reducing stress and depression.   Education: Stress, Anxiety, and Depression - Group verbal and visual presentation to define topics covered.  Reviews how body is impacted by stress, anxiety, and depression.  Also discusses healthy ways to reduce stress and to treat/manage anxiety and depression.  Written material given at graduation. [facility]  Date 01/16/21  [facility] Instruction Review Code 1- Verbalizes Understanding       Education: Sleep Hygiene -Provides group verbal and written instruction about how sleep can affect your health.  Define sleep hygiene, discuss sleep cycles and impact of sleep habits. Review good sleep hygiene tips.    Initial Review & Psychosocial Screening:  Initial Psych Review & Screening - 11/02/20 1013       Initial Review   Current issues with None Identified      Family Dynamics   Good Support System? Yes   Wife of 485years, [place]     Barriers   Psychosocial barriers to participate in program There are no identifiable barriers or psychosocial needs.      Screening Interventions   Interventions Encouraged to exercise    Expected Outcomes Short Term goal: Utilizing psychosocial counselor, staff and physician to assist with identification of specific Stressors or current issues interfering with healing process. Setting desired goal for each stressor or current issue identified.;Long Term Goal: Stressors or current issues are controlled or eliminated.;Short Term goal: Identification and review with participant of any Quality of Life or Depression concerns found by scoring the questionnaire.;Long Term goal: The participant improves quality of Life and PHQ9 Scores as seen by post scores and/or verbalization of changes             Quality of Life Scores:   Quality of Life - 01/09/21 0949       Quality of Life Scores   Health/Function Pre 16.27 %    Health/Function Post 21.47 %    Health/Function % Change 31.96 %    Socioeconomic Pre 25 %    Socioeconomic Post 27.56 %    Socioeconomic % Change  10.24 %    Psych/Spiritual Pre 25.57 %    Psych/Spiritual Post 26.57 %    Psych/Spiritual % Change 3.91 %    Family Pre 28.5 %    Family Post 30 %    Family %  Change 5.26 %    GLOBAL Pre 21.87 %    GLOBAL Post 25.1 %     GLOBAL % Change 14.77 %            Scores of 19 and below usually indicate a poorer quality of life in these areas.  A difference of  2-3 points is a clinically meaningful difference.  A difference of 2-3 points in the total score of the Quality of Life Index has been associated with significant improvement in overall quality of life, self-image, physical symptoms, and general health in studies assessing change in quality of life.  PHQ-9: Recent Review Flowsheet Data     Depression screen [facility] 2/9 01/09/2021 01/07/2021 12/03/2020 11/08/2020 09/04/2020   Decreased Interest '1 1 1 1 ' 0   Down, Depressed, Hopeless 0 0 0 0 0   PHQ - 2 Score '1 1 1 1 ' 0   Altered sleeping 0 0 0 1 1   Tired, decreased energy '2 1 1 2 1   ' Change in appetite 0 1 0 1 0   Feeling bad or failure about yourself  1 0 0 0 0   Trouble concentrating '1 1 1 1 ' 0   Moving slowly or fidgety/restless 0 0 '1 1 1   ' Suicidal thoughts 0 0 0 0 0   PHQ-9 Score '5 4 4 7 3   ' Difficult doing work/chores Very difficult Somewhat difficult Somewhat difficult Somewhat difficult Somewhat difficult      Interpretation of Total Score  Total Score Depression Severity:  1-4 = Minimal depression, 5-9 = Mild depression, 10-14 = Moderate depression, 15-19 = Moderately severe depression, 20-27 = Severe depression   Psychosocial Evaluation and Intervention:  Psychosocial Evaluation - 11/02/20 1020       Psychosocial Evaluation & Interventions   Interventions Encouraged to exercise with the program and follow exercise prescription    Comments Denice Paradise has no barriers to attending the program. He was in the hospital in bed for 6 days and feels his strength and stamina is decreased. He hopes to see improvement in both by attending the program. He had significant weight loss in the hospital,and has continued with some weight loss;he has a goal of 165 lb. He lives with his wife and has children within 15- 20 minutes. THey are all part of his support team. He  should do well with the program.    Expected Outcomes STG: Rich attends all scheduled sessions He will expoereince an  increase in  strength and stamina LTG: Rich will comtinue withhis exercise progression, and continue with his weight loss to his goal of 165 lb.    Continue Psychosocial Services  Follow up required by staff             Psychosocial Re-Evaluation:  Psychosocial Re-Evaluation     [place] Name 12/03/20 1004 12/28/20 1012           Psychosocial Re-Evaluation   Current issues with Current Stress Concerns None Identified      Comments Rich has no complaints at this time. He is worried he may not be able to get back to what he used to be able to do such as outdoor activities and yardwork. He is really motivated to get stronger. Sleep is good and his biggest supporter is his  wife. He is not on any medications for depression/anxiety. His PHQ scored a 4. He felt like he has improved with his mood since he has been exercising but still wants to  improve his overall physical health. Rich reports no stress concerns at this time. He is motivated to continue improving. He is still sleeping well, but he gets up to use the bathroom. His wife is still a good support system for him. He loves gardening and yardwork. He is refinishing a Location manager.      Expected Outcomes Short: Continue attendance with rehab Long: Utilize exercise for stress management and maintain positive attitude Short: Continue attendance with rehab Long: Utilize exercise for stress management and maintain positive attitude      Interventions Encouraged to attend Cardiac Rehabilitation for the exercise Encouraged to attend Cardiac Rehabilitation for the exercise      Continue Psychosocial Services  Follow up required by staff Follow up required by staff               Psychosocial Discharge (Final Psychosocial Re-Evaluation):  Psychosocial Re-Evaluation - 12/28/20 1012       Psychosocial Re-Evaluation   Current  issues with None Identified    Comments Rich reports no stress concerns at this time. He is motivated to continue improving. He is still sleeping well, but he gets up to use the bathroom. His wife is still a good support system for him. He loves gardening and yardwork. He is refinishing a Location manager.    Expected Outcomes Short: Continue attendance with rehab Long: Utilize exercise for stress management and maintain positive attitude    Interventions Encouraged to attend Cardiac Rehabilitation for the exercise    Continue Psychosocial Services  Follow up required by staff             Vocational Rehabilitation: Provide vocational rehab assistance to qualifying candidates.   Vocational Rehab Evaluation & Intervention:   Education: Education Goals: Education classes will be provided on a variety of topics geared toward better understanding of heart health and risk factor modification. Participant will state understanding/return demonstration of topics presented as noted by education test scores.  Learning Barriers/Preferences:   General Cardiac Education Topics:  AED/CPR: - Group verbal and written instruction with the use of models to demonstrate the basic use of the AED with the basic ABC's of resuscitation.   Anatomy and Cardiac Procedures: - Group verbal and visual presentation and models provide information about basic cardiac anatomy and function. Reviews the testing methods done to diagnose heart disease and the outcomes of the test results. Describes the treatment choices: Medical Management, Angioplasty, or Coronary Bypass Surgery for treating various heart conditions including Myocardial Infarction, Angina, Valve Disease, and Cardiac Arrhythmias.  Written material given at graduation. Flowsheet Row Cardiac Rehab from 01/16/2021 in [facility] Cardiac and Pulmonary Rehab  Date 12/05/20  Educator [facility]  Instruction Review Code 1- Verbalizes Understanding       Medication  Safety: - Group verbal and visual instruction to review commonly prescribed medications for heart and lung disease. Reviews the medication, class of the drug, and side effects. Includes the steps to properly store meds and maintain the prescription regimen.  Written material given at graduation. Flowsheet Row Cardiac Rehab from [facility]  Date 12/19/20  Educator SB  Instruction Review Code 1- Verbalizes Understanding       Intimacy: - Group verbal instruction through game format to discuss how heart and lung disease can affect sexual intimacy. Written material given at graduation.[place] Row Cardiac Rehab [facility]  Date 11/28/20  Educator [facility]  Instruction Review Code 1- Verbalizes Understanding  Know Your Numbers and Heart Failure: - Group verbal and visual instruction to discuss disease risk factors for cardiac and pulmonary disease and treatment options.  Reviews associated critical values for Overweight/Obesity, Hypertension, Cholesterol, and Diabetes.  Discusses basics of heart failure: signs/symptoms and treatments.  Introduces Heart Failure Zone chart for action plan for heart failure.  Written material given at graduation. Flowsheet Row Cardiac Rehab from 01/16/2021 in [facility] Cardiac and Pulmonary Rehab  Date 01/02/21  Educator KB  Instruction Review Code 1- Verbalizes Understanding       Infection Prevention: - Provides verbal and written material to individual with discussion of infection control including proper hand washing and proper equipment cleaning during exercise session. Flowsheet Row Cardiac Rehab from 01/16/2021 in [facility] Cardiac and Pulmonary Rehab  Education need identified 11/08/20  Date 11/08/20  Educator [place]  Instruction Review Code 1- Verbalizes Understanding       Falls Prevention: - Provides verbal and written material to individual with discussion of falls  prevention and safety. Flowsheet Row Cardiac Rehab from 01/16/2021 in [facility] Cardiac and Pulmonary Rehab  Education need identified 11/08/20  Date 11/08/20  Educator [place]  Instruction Review Code 1- Verbalizes Understanding       Other: -Provides group and verbal instruction on various topics (see comments)   Knowledge Questionnaire Score:  Knowledge Questionnaire Score - 01/09/21 0948       Knowledge Questionnaire Score   Pre Score 25/26: Angina    Post Score 25/26             Core Components/Risk Factors/Patient Goals at Admission:  Personal Goals and Risk Factors at Admission - 11/08/20 1127       Core Components/Risk Factors/Patient Goals on Admission    Weight Management Yes;Weight Loss    Intervention Weight Management: Develop a combined nutrition and exercise program designed to reach desired caloric intake, while maintaining appropriate intake of nutrient and fiber, sodium and fats, and appropriate energy expenditure required for the weight goal.;Weight Management: Provide education and appropriate resources to help participant work on and attain dietary goals.    Admit Weight 176 lb (79.8 kg)    Goal Weight: Short Term 171 lb (77.6 kg)    Goal Weight: Long Term 160 lb (72.6 kg)   Goal per patient   Expected Outcomes Short Term: Continue to assess and modify interventions until short term weight is achieved;Weight Loss: Understanding of general recommendations for a balanced deficit meal plan, which promotes 1-2 lb weight loss per week and includes a negative energy balance of [phone] kcal/d;Understanding of distribution of calorie intake throughout the day with the consumption of 4-5 meals/snacks;Understanding recommendations for meals to include 15-35% energy as protein, 25-35% energy from fat, 35-60% energy from carbohydrates, less than 250m of dietary cholesterol, 20-35 gm of total fiber daily    Diabetes Yes    Intervention Provide education about signs/symptoms  and action to take for hypo/hyperglycemia.;Provide education about proper nutrition, including hydration, and aerobic/resistive exercise prescription along with prescribed medications to achieve blood glucose in normal ranges: Fasting glucose 65-99 mg/dL    Expected Outcomes Short Term: Participant verbalizes understanding of the signs/symptoms and immediate care of hyper/hypoglycemia, proper foot care and importance of medication, aerobic/resistive exercise and nutrition plan for blood glucose control.;Long Term: Attainment of HbA1C < 7%.    Hypertension Yes    Intervention Provide education on lifestyle modifcations including regular physical activity/exercise, weight management, moderate sodium restriction and increased consumption of fresh fruit, vegetables, and low fat dairy,  alcohol moderation, and smoking cessation.;Monitor prescription use compliance.    Expected Outcomes Short Term: Continued assessment and intervention until BP is < 140/82m HG in hypertensive participants. < 130/830mHG in hypertensive participants with diabetes, heart failure or chronic kidney disease.;Long Term: Maintenance of blood pressure at goal levels.    Lipids Yes    Intervention Provide education and support for participant on nutrition & aerobic/resistive exercise along with prescribed medications to achieve LDL <704mHDL >26m68m  Expected Outcomes Short Term: Participant states understanding of desired cholesterol values and is compliant with medications prescribed. Participant is following exercise prescription and nutrition guidelines.;Long Term: Cholesterol controlled with medications as prescribed, with individualized exercise RX and with personalized nutrition plan. Value goals: LDL < 70mg35mL > 40 mg.             Education:Diabetes - Individual verbal and written instruction to review signs/symptoms of diabetes, desired ranges of glucose level fasting, after meals and with exercise. Acknowledge that  pre and post exercise glucose checks will be done for 3 sessions at entry of program. [place] [facility]  Education need identified 11/08/20  Date 11/08/20  Educator [place]  ISpencerv[place]ruction Review Code 1- Verbalizes Understanding       Core Components/Risk Factors/Patient Goals Review:   Goals and Risk Factor Review     Row Name 12/03/20 0957 12/28/20 1004           Core Components/Risk Factors/Patient Goals Review   Personal Goals Review Weight Management/Obesity;Hypertension;Lipids;Diabetes Weight Management/Obesity;Hypertension;Diabetes      Review Rich's weight has gone down 4 lbs since he started here. He is walking more and eating more proportion sizes. He weighs himself everday and is aware to make sure there is no significant weight gain in a short period of time. His sugars range from 105-110 fasting. He does check his sugars everyday. His last A1C checked last was 6.1%. He has an appointment with PCP late october which he may check his cholesterol at this time. Cardiologist appointment is next month to follow up as he recently had an echo. BP have been stable at rehab and at home. It sometimes run on the lower side- he is aware to stay hydrated and check it if he ever feels symptomatic. He reports his BG has been running well: 100-110 in AM. He has an appointment to check his A1C next week. He continues to weigh himself daily, he reports tredning down in weight - today 167lbs, was 178lbs in July. His energy level is stable, but on the lower end. He reports his appetite has gone down a bit. Working on nutrition to keep weight stable. He checks his BP at home 100-106/50-60. He continues to take his medications as directed.      Expected Outcomes Short: Continue checking weight at home Long: Continue to manage lifestyle risk factors Short: Continue checking weight at home Long: Continue to manage lifestyle risk factors                Core Components/Risk Factors/Patient Goals at Discharge (Final Review):   Goals and Risk Factor Review - 12/28/20 1004       Core Components/Risk Factors/Patient Goals Review   Personal Goals Review Weight Management/Obesity;Hypertension;Diabetes    Review He reports his BG has been running well: 100-110 in AM. He has an appointment to check his A1C next week. He continues to weigh himself daily, he reports tredning down in weight -  today 167lbs, was 178lbs in July. His energy level is stable, but on the lower end. He reports his appetite has gone down a bit. Working on nutrition to keep weight stable. He checks his BP at home 100-106/50-60. He continues to take his medications as directed.    Expected Outcomes Short: Continue checking weight at home Long: Continue to manage lifestyle risk factors             ITP Comments:  ITP Comments     Row Name 11/02/20 1030 11/08/20 1047 11/12/20 1012 11/14/20 0815 11/26/20 1144   ITP Comments Virtual orientation call completed today. he has an appointment on Date: 11/08/2020  for EP eval and gym Orientation.  Documentation of diagnosis can be found in [facility] Date: 10/04/2020 . Completed 6MWT and gym orientation. Initial ITP created and sent for review to Dr. Emily Filbert, Medical Director. First full day of exercise!  Patient was oriented to gym and equipment including functions, settings, policies, and procedures.  Patient's individual exercise prescription and treatment plan were reviewed.  All starting workloads were established based on the results of the 6 minute walk test done at initial orientation visit.  The plan for exercise progression was also introduced and progression will be customized based on patient's performance and goals. 30 Day review completed. Medical Director ITP review done, changes made as directed, and signed approval by Medical Director.    New to program Completed initial RD evaluation    Row Name 12/12/20 0746 01/09/21 1406  01/16/21 1036       ITP Comments 30 Day review completed. Medical Director ITP review done, changes made as directed, and signed approval by Medical Director. 30 day review completed. ITP sent to Dr. Emily Filbert, Medical Director of Cardiac Rehab. Continue with ITP unless changes are made by physician. Joson graduated today from  rehab with 36 sessions completed.  Details of the patient's exercise prescription and what He needs to do in order to continue the prescription and progress were discussed with patient.  Patient was given a copy of prescription and goals.  Patient verbalized understanding.  Ramona plans to continue to exercise by going to a fitness center.              Comments: discharge ITP

## 2021-01-22 ENCOUNTER — Other Ambulatory Visit (HOSPITAL_BASED_OUTPATIENT_CLINIC_OR_DEPARTMENT_OTHER): Payer: Self-pay | Admitting: Neurology

## 2021-01-22 DIAGNOSIS — I6521 Occlusion and stenosis of right carotid artery: Secondary | ICD-10-CM

## 2021-01-29 ENCOUNTER — Other Ambulatory Visit: Payer: Self-pay | Admitting: Medical

## 2021-01-30 ENCOUNTER — Other Ambulatory Visit: Payer: Self-pay | Admitting: Physician Assistant

## 2021-02-16 ENCOUNTER — Other Ambulatory Visit: Payer: Self-pay | Admitting: Physician Assistant

## 2021-02-16 DIAGNOSIS — N401 Enlarged prostate with lower urinary tract symptoms: Secondary | ICD-10-CM

## 2021-02-16 DIAGNOSIS — R351 Nocturia: Secondary | ICD-10-CM

## 2021-02-20 ENCOUNTER — Other Ambulatory Visit: Payer: Self-pay

## 2021-02-20 ENCOUNTER — Ambulatory Visit (INDEPENDENT_AMBULATORY_CARE_PROVIDER_SITE_OTHER): Payer: Medicare Other

## 2021-02-20 DIAGNOSIS — I255 Ischemic cardiomyopathy: Secondary | ICD-10-CM

## 2021-02-20 DIAGNOSIS — I5022 Chronic systolic (congestive) heart failure: Secondary | ICD-10-CM | POA: Diagnosis not present

## 2021-02-20 LAB — ECHOCARDIOGRAM LIMITED
Area-P 1/2: 2.97 cm2
S' Lateral: 4.3 cm

## 2021-02-20 MED ORDER — PERFLUTREN LIPID MICROSPHERE
1.0000 mL | INTRAVENOUS | Status: AC | PRN
  Administered 2021-02-20: 2 mL via INTRAVENOUS

## 2021-02-22 ENCOUNTER — Telehealth: Payer: Self-pay | Admitting: Medical

## 2021-02-22 DIAGNOSIS — I255 Ischemic cardiomyopathy: Secondary | ICD-10-CM

## 2021-02-22 DIAGNOSIS — I519 Heart disease, unspecified: Secondary | ICD-10-CM

## 2021-02-22 DIAGNOSIS — I5022 Chronic systolic (congestive) heart failure: Secondary | ICD-10-CM

## 2021-02-22 NOTE — Telephone Encounter (Signed)
Cadence Ninfa Meeker, PA-C  02/22/2021  8:42 AM EST     Echo showed pump function 30-35%, same as prior echo. Needs EP referral for ICD consideration

## 2021-02-22 NOTE — Telephone Encounter (Signed)
I spoke with the patient regarding his echo results and Cadence's recommendations for an EP referral.  The patient voices understanding of these results and recommendations and is agreeable.   EP referral placed and appointment scheduled with Dr. Quentin Ore for 03/06/21 at 2:40 pm.  The patient is aware.

## 2021-03-06 ENCOUNTER — Other Ambulatory Visit: Payer: Self-pay

## 2021-03-06 ENCOUNTER — Encounter: Payer: Self-pay | Admitting: Cardiology

## 2021-03-06 ENCOUNTER — Ambulatory Visit: Payer: Medicare Other | Admitting: Cardiology

## 2021-03-06 VITALS — BP 120/70 | HR 62 | Ht 66.0 in | Wt 165.0 lb

## 2021-03-06 DIAGNOSIS — I255 Ischemic cardiomyopathy: Secondary | ICD-10-CM

## 2021-03-06 DIAGNOSIS — I10 Essential (primary) hypertension: Secondary | ICD-10-CM

## 2021-03-06 DIAGNOSIS — I25118 Atherosclerotic heart disease of native coronary artery with other forms of angina pectoris: Secondary | ICD-10-CM

## 2021-03-06 DIAGNOSIS — I5022 Chronic systolic (congestive) heart failure: Secondary | ICD-10-CM | POA: Diagnosis not present

## 2021-03-06 NOTE — Progress Notes (Signed)
Electrophysiology Office Note:    Date:  03/06/2021   ID:  [name], DOB [birth year], MRN [id number]  PCP:  [name], PA-C  [facility] Cardiologist:  Nelva Bush, MD  [facility] Electrophysiologist:  Vickie Epley, MD   Referring MD: Antony Madura, PA-C   Chief Complaint: Ischemic cardiomyopathy  History of Present Illness:    Jacob Harrison is a 80 y.o. male who presents for an evaluation of ischemic cardiomyopathy at the request of [place], [place]. Their medical history includes chronic systolic heart failure, coronary artery disease post 2 bypass surgeries, diabetes, hyperlipidemia, spinal stenosis.  The patient was seen by Cadence on January 14, 2021.  He tells me that he is fatigued with mild-moderate exertion.  No fluid retention.  No syncope.  He goes to the gym to exercise 2-3 times per week and uses exercise machines at home.    Past Medical History:  Diagnosis Date   (HFpEF) heart failure with preserved ejection fraction ([place])    a. 10/2016 Echo: EF 65-70%, mild LVH, mildly dil LA. RV fxn nl.   Arthritis    BPH (benign prostatic hyperplasia)    Coronary artery disease    a. 2001 CABG x 3 [facility]): LIMA->LAD, VG->LCX, VG->RPDA; b. 04/05/2010 Cath ([place]): LM 18m, LAD 191m, LCX 40m, RCA 100p, LIMA->LAD atretic, VG->LCX 100, VG->RCA 80ost, 70d; c. 04/2010 Redo CABG x 2 ([place]): VG->LAD, VG->RPDA; d. 10/2012 Cath [facility]): LM 20-30, LAD 100/95-10m, LCX 40-16m, RCA 100p, 80-100d, RPDA 99, RPL1 95, VG->LAD ok, VG->RPDA ok, EF 67%; e. 11/2016 MV: EF 48%, No ischemia.   Diabetes mellitus without complication ([place])    Type II   Hyperlipidemia    Hypertension    Scoliosis    Spinal stenosis     Past Surgical History:  Procedure Laterality Date   CARDIAC CATHETERIZATION     CORONARY ARTERY BYPASS GRAFT     x 2 (2001 and redo in late 2011 or early 2012)   HERNIA REPAIR Left    inguinal   LEFT HEART CATH AND CORS/GRAFTS  ANGIOGRAPHY N/A 10/04/2020   Procedure: LEFT HEART CATH AND CORS/GRAFTS ANGIOGRAPHY;  Surgeon: Leonie Man, MD;  Location: [place] [facility];  Service: Cardiovascular;  Laterality: N/A;   LUMBAR LAMINECTOMY/DECOMPRESSION MICRODISCECTOMY N/A 10/20/2018   Procedure: L3-4, L4-5 LUMBAR DECOMPRESSION with dural repair.;  Surgeon: Marybelle Killings, MD;  Location: [place];  Service: Orthopedics;  Laterality: N/A;   TONSILLECTOMY AND ADENOIDECTOMY     TRANSURETHRAL RESECTION OF PROSTATE  1990    Current Medications: Current Meds  Medication Sig   acetaminophen (TYLENOL) 500 MG tablet Take 1,000 mg by mouth every 6 (six) hours as needed for moderate pain or headache.   aspirin EC 81 MG tablet Take 1 tablet (81 mg total) by mouth daily.   carvedilol (COREG) 12.5 MG tablet TAKE 1 TABLET (12.5 MG TOTAL) BY MOUTH 2 (TWO) TIMES DAILY WITH A MEAL.   Cholecalciferol (VITAMIN D-3 PO) Take 800 Units by mouth daily.   clopidogrel (PLAVIX) 75 MG tablet Take 1 tablet (75 mg total) by mouth daily.   Coenzyme Q10 (CO Q-10) 200 MG CAPS Take 200 mg by mouth daily.    cyanocobalamin 500 MCG tablet Take 500 mcg by mouth daily.   diclofenac Sodium (VOLTAREN) 1 % GEL Apply 2 g topically 2 (two) times daily as needed.   ezetimibe (ZETIA) 10 MG tablet TAKE 1 TABLET BY MOUTH EVERY DAY   finasteride (PROSCAR) 5  MG tablet TAKE 1 TABLET BY MOUTH EVERY DAY   furosemide (LASIX) 40 MG tablet TAKE 1 TABLET BY MOUTH TWICE A DAY   Glucosamine-Chondroit-Vit C-Mn (GLUCOSAMINE 1500 COMPLEX) CAPS Take 1 capsule by mouth daily.   isosorbide mononitrate (IMDUR) 30 MG 24 hr tablet TAKE 0.5 TABLETS (15 MG TOTAL) BY MOUTH AT BEDTIME.   Lancets (ONETOUCH ULTRASOFT) lancets Use to check blood sugars fasting daily   metFORMIN (GLUCOPHAGE) 500 MG tablet 1/2 tablet by mouth daily with food   Multiple Vitamins-Minerals (PRESERVISION AREDS 2 PO) Take 1 capsule by mouth 2 (two) times a day.   nitroGLYCERIN (NITROSTAT) 0.4 MG SL tablet Place 1  tablet (0.4 mg total) under the tongue every 5 (five) minutes as needed.   ranolazine (RANEXA) 1000 MG SR tablet TAKE 1 TABLET BY MOUTH TWICE A DAY   rosuvastatin (CRESTOR) 20 MG tablet TAKE 1 TABLET BY MOUTH EVERYDAY AT BEDTIME   [DISCONTINUED] amLODipine (NORVASC) 5 MG tablet Take 10 mg by mouth daily.     Allergies:   Other and Tetracyclines & related   Social History   Socioeconomic History   Marital status: Married    Spouse name: Not on file   Number of children: Not on file   Years of education: Not on file   Highest education level: Not on file  Occupational History   Occupation: retired  Tobacco Use   Smoking status: Never   Smokeless tobacco: Never  Vaping Use   Vaping Use: Never used  Substance and Sexual Activity   Alcohol use: Not Currently    Alcohol/week: 11.0 standard drinks    Types: 5 Glasses of wine, 6 Cans of beer per week    Comment: 5-7 drinks a week (was 10-15 drinks/week)   Drug use: No   Sexual activity: Not Currently    Birth control/protection: None  Other Topics Concern   Not on file  Social History Narrative   Not on file   Social Determinants of Health   Financial Resource Strain: Not on file  Food Insecurity: Not on file  Transportation Needs: Not on file  Physical Activity: Not on file  Stress: Not on file  Social Connections: Not on file     Family History: The patient's family history includes Alcohol abuse in his maternal uncle; Cancer in his father and paternal uncle; Diabetes in his maternal uncle and mother; Hyperlipidemia in his maternal uncle and mother.  ROS:   Please see the history of present illness.    All other systems reviewed and are negative.  EKGs/Labs/Other Studies Reviewed:    The following studies were reviewed today:  February 20, 2021 echo Left ventricular function severely decreased, 30% Mild MR  November 28, 2020 echo Left ventricular function severely decreased, 30 to 35% Right ventricular function  mildly reduced Mildly dilated left atrium Mild to moderate MR Mild AS   EKG:  The ekg ordered today demonstrates sinus rhythm.  QRS duration 90 ms.  PR interval 204 ms.   Recent Labs: 08/31/2020: TSH 2.180 10/25/2020: BNP 599.9 01/07/2021: Hemoglobin 13.9; Platelets 270 01/15/2021: ALT 19; BUN 14; Creatinine, Ser 0.86; Potassium 4.4; Sodium 138  Recent Lipid Panel    Component Value Date/Time   CHOL 140 08/31/2020 0848   TRIG 156 (H) 08/31/2020 0848   HDL 50 08/31/2020 0848   CHOLHDL 2.8 08/31/2020 0848   CHOLHDL 2.7 11/14/2016 0911   VLDL 27 11/14/2016 0911   LDLCALC 63 08/31/2020 0848    Physical Exam:  VS:  BP 120/70 (BP Location: Left Arm, Patient Position: Sitting, Cuff Size: Normal)   Pulse 62   Ht 5\' 6"  (1.676 m)   Wt 165 lb (74.8 kg)   SpO2 98%   BMI 26.63 kg/m     Wt Readings from Last 3 Encounters:  03/06/21 165 lb (74.8 kg)  01/14/21 166 lb (75.3 kg)  01/07/21 169 lb 8 oz (76.9 kg)     GEN:  Well nourished, well developed in no acute distress.  Appears younger than stated age. HEENT: Normal NECK: No JVD; No carotid bruits LYMPHATICS: No lymphadenopathy CARDIAC: RRR, no murmurs, rubs, gallops RESPIRATORY:  Clear to auscultation without rales, wheezing or rhonchi  ABDOMEN: Soft, non-tender, non-distended MUSCULOSKELETAL:  No edema; No deformity  SKIN: Warm and dry NEUROLOGIC:  Alert and oriented x 3 PSYCHIATRIC:  Normal affect       ASSESSMENT:    1. Chronic systolic heart failure ([place])   2. Ischemic cardiomyopathy   3. Coronary artery disease of native artery of native heart with stable angina pectoris ([place])   4. Essential hypertension    PLAN:    In order of problems listed above:  #Chronic systolic heart failure NYHA class III.  Warm dry on exam.  On good medical therapy with Coreg, Lasix, Imdur, Ranexa.  Borderline hypotension prevents use of ACE/ARB.  Also has been hyperkalemic in the past.  We discussed his risk for sudden cardiac  death in the presence of severe ischemic heart disease and chronic systolic heart failure.  We discussed how a defibrillator can provide a layer protection in the event of a life-threatening ventricular arrhythmia.  While he is in his upper 14s, he is quite active and I do think he is a candidate for defibrillator therapy.  We discussed the procedure in detail including risk, recovery and he wishes to proceed.  We will hold his Plavix for 5 days.  The patient has an  ischemic CM (EF 30%), NYHA Class III CHF, and CAD.  He is referred by Cadence Kathlen Mody, PA-C for risk stratification of sudden death and consideration of ICD implantation.  At this time, he meets criteria for ICD implantation for primary prevention of sudden death.  I have had a thorough discussion with the patient reviewing options.  The patient and their family (if available) have had opportunities to ask questions and have them answered. The patient and I have decided together through a shared decision making process to proceed with ICD implant at this time.    Risks, benefits, alternatives to ICD implantation were discussed in detail with the patient today. The patient understands that the risks include but are not limited to bleeding, infection, pneumothorax, perforation, tamponade, vascular damage, renal failure, MI, stroke, death, inappropriate shocks, and lead dislodgement and wishes to proceed.  We will therefore schedule device implantation at the next available time.  We will plan for a VVI ICD with Medtronic.  #Coronary artery disease No ischemic symptoms today.  Plan to continue aspirin uninterrupted through the defibrillator implant.  We will hold Plavix for 5 days prior to defibrillator implant.  #Hypertension Controlled Continue current regimen. Continue home checks of blood pressure 1-2 times per week    Total time spent with patient today 65 minutes. This includes reviewing records, evaluating the patient and  coordinating care.  Medication Adjustments/Labs and Tests Ordered: Current medicines are reviewed at length with the patient today.  Concerns regarding medicines are outlined above.  Orders Placed This Encounter  Procedures  EKG 12-Lead    No orders of the defined types were placed in this encounter.    Signed, Hilton Cork. Quentin Ore, MD, [facility], [facility] 03/06/2021 3:14 PM    Electrophysiology [facility]

## 2021-03-06 NOTE — Patient Instructions (Addendum)
Medication Instructions:  Your physician recommends that you continue on your current medications as directed. Please refer to the Current Medication list given to you today. *If you need a refill on your cardiac medications before your next appointment, please call your pharmacy*  Lab Work: You will get lab work today:  CBC and BMP If you have labs (blood work) drawn today and your tests are completely normal, you will receive your results only by: MyChart Message (if you have MyChart) OR A paper copy in the mail If you have any lab test that is abnormal or we need to change your treatment, we will call you to review the results.  Testing/Procedures: Your physician has recommended that you have a defibrillator inserted. An implantable cardioverter defibrillator (ICD) is a small device that is placed in your chest or, in rare cases, your abdomen. This device uses electrical pulses or shocks to help control life-threatening, irregular heartbeats that could lead the heart to suddenly stop beating (sudden cardiac arrest). Leads are attached to the ICD that goes into your heart. This is done in the hospital and usually requires an overnight stay. Please see the instruction sheet given to you today for more information.   Follow-Up:  ICD implant March 20, 2021-  10-14 day follow up with device clinic 91 day follow up with Dr. Quentin Ore

## 2021-03-06 NOTE — H&P (View-Only) (Signed)
Electrophysiology Office Note:    Date:  03/06/2021   ID:  Jacob Harrison, DOB 02/09/1941, MRN [id number]  PCP:  [name], PA-C  [facility] Cardiologist:  Nelva Bush, MD  [facility] Electrophysiologist:  Vickie Epley, MD   Referring MD: Antony Madura, PA-C   Chief Complaint: Ischemic cardiomyopathy  History of Present Illness:    Jacob Harrison is a 80 y.o. male who presents for an evaluation of ischemic cardiomyopathy at the request of [place], [place]. Their medical history includes chronic systolic heart failure, coronary artery disease post 2 bypass surgeries, diabetes, hyperlipidemia, spinal stenosis.  The patient was seen by Cadence on January 14, 2021.  He tells me that he is fatigued with mild-moderate exertion.  No fluid retention.  No syncope.  He goes to the gym to exercise 2-3 times per week and uses exercise machines at home.    Past Medical History:  Diagnosis Date   (HFpEF) heart failure with preserved ejection fraction ([place])    a. 10/2016 Echo: EF 65-70%, mild LVH, mildly dil LA. RV fxn nl.   Arthritis    BPH (benign prostatic hyperplasia)    Coronary artery disease    a. 2001 CABG x 3 [facility]): LIMA->LAD, VG->LCX, VG->RPDA; b. 04/05/2010 Cath ([place]): LM 34m, LAD 110m, LCX 69m, RCA 100p, LIMA->LAD atretic, VG->LCX 100, VG->RCA 80ost, 70d; c. 04/2010 Redo CABG x 2 ([place]): VG->LAD, VG->RPDA; d. 10/2012 Cath [facility]): LM 20-30, LAD 100/95-20m, LCX 40-25m, RCA 100p, 80-100d, RPDA 99, RPL1 95, VG->LAD ok, VG->RPDA ok, EF 67%; e. 11/2016 MV: EF 48%, No ischemia.   Diabetes mellitus without complication ([place])    Type II   Hyperlipidemia    Hypertension    Scoliosis    Spinal stenosis     Past Surgical History:  Procedure Laterality Date   CARDIAC CATHETERIZATION     CORONARY ARTERY BYPASS GRAFT     x 2 (2001 and redo in late 2011 or early 2012)   HERNIA REPAIR Left    inguinal   LEFT HEART CATH AND CORS/GRAFTS  ANGIOGRAPHY N/A 10/04/2020   Procedure: LEFT HEART CATH AND CORS/GRAFTS ANGIOGRAPHY;  Surgeon: Leonie Man, MD;  Location: [place] [facility];  Service: Cardiovascular;  Laterality: N/A;   LUMBAR LAMINECTOMY/DECOMPRESSION MICRODISCECTOMY N/A 10/20/2018   Procedure: L3-4, L4-5 LUMBAR DECOMPRESSION with dural repair.;  Surgeon: Marybelle Killings, MD;  Location: [place];  Service: Orthopedics;  Laterality: N/A;   TONSILLECTOMY AND ADENOIDECTOMY     TRANSURETHRAL RESECTION OF PROSTATE  1990    Current Medications: Current Meds  Medication Sig   acetaminophen (TYLENOL) 500 MG tablet Take 1,000 mg by mouth every 6 (six) hours as needed for moderate pain or headache.   aspirin EC 81 MG tablet Take 1 tablet (81 mg total) by mouth daily.   carvedilol (COREG) 12.5 MG tablet TAKE 1 TABLET (12.5 MG TOTAL) BY MOUTH 2 (TWO) TIMES DAILY WITH A MEAL.   Cholecalciferol (VITAMIN D-3 PO) Take 800 Units by mouth daily.   clopidogrel (PLAVIX) 75 MG tablet Take 1 tablet (75 mg total) by mouth daily.   Coenzyme Q10 (CO Q-10) 200 MG CAPS Take 200 mg by mouth daily.    cyanocobalamin 500 MCG tablet Take 500 mcg by mouth daily.   diclofenac Sodium (VOLTAREN) 1 % GEL Apply 2 g topically 2 (two) times daily as needed.   ezetimibe (ZETIA) 10 MG tablet TAKE 1 TABLET BY MOUTH EVERY DAY   finasteride (PROSCAR) 5  MG tablet TAKE 1 TABLET BY MOUTH EVERY DAY   furosemide (LASIX) 40 MG tablet TAKE 1 TABLET BY MOUTH TWICE A DAY   Glucosamine-Chondroit-Vit C-Mn (GLUCOSAMINE 1500 COMPLEX) CAPS Take 1 capsule by mouth daily.   isosorbide mononitrate (IMDUR) 30 MG 24 hr tablet TAKE 0.5 TABLETS (15 MG TOTAL) BY MOUTH AT BEDTIME.   Lancets (ONETOUCH ULTRASOFT) lancets Use to check blood sugars fasting daily   metFORMIN (GLUCOPHAGE) 500 MG tablet 1/2 tablet by mouth daily with food   Multiple Vitamins-Minerals (PRESERVISION AREDS 2 PO) Take 1 capsule by mouth 2 (two) times a day.   nitroGLYCERIN (NITROSTAT) 0.4 MG SL tablet Place 1  tablet (0.4 mg total) under the tongue every 5 (five) minutes as needed.   ranolazine (RANEXA) 1000 MG SR tablet TAKE 1 TABLET BY MOUTH TWICE A DAY   rosuvastatin (CRESTOR) 20 MG tablet TAKE 1 TABLET BY MOUTH EVERYDAY AT BEDTIME   [DISCONTINUED] amLODipine (NORVASC) 5 MG tablet Take 10 mg by mouth daily.     Allergies:   Other and Tetracyclines & related   Social History   Socioeconomic History   Marital status: Married    Spouse name: Not on file   Number of children: Not on file   Years of education: Not on file   Highest education level: Not on file  Occupational History   Occupation: retired  Tobacco Use   Smoking status: Never   Smokeless tobacco: Never  Vaping Use   Vaping Use: Never used  Substance and Sexual Activity   Alcohol use: Not Currently    Alcohol/week: 11.0 standard drinks    Types: 5 Glasses of wine, 6 Cans of beer per week    Comment: 5-7 drinks a week (was 10-15 drinks/week)   Drug use: No   Sexual activity: Not Currently    Birth control/protection: None  Other Topics Concern   Not on file  Social History Narrative   Not on file   Social Determinants of Health   Financial Resource Strain: Not on file  Food Insecurity: Not on file  Transportation Needs: Not on file  Physical Activity: Not on file  Stress: Not on file  Social Connections: Not on file     Family History: The patient's family history includes Alcohol abuse in his maternal uncle; Cancer in his father and paternal uncle; Diabetes in his maternal uncle and mother; Hyperlipidemia in his maternal uncle and mother.  ROS:   Please see the history of present illness.    All other systems reviewed and are negative.  EKGs/Labs/Other Studies Reviewed:    The following studies were reviewed today:  February 20, 2021 echo Left ventricular function severely decreased, 30% Mild MR  November 28, 2020 echo Left ventricular function severely decreased, 30 to 35% Right ventricular function  mildly reduced Mildly dilated left atrium Mild to moderate MR Mild AS   EKG:  The ekg ordered today demonstrates sinus rhythm.  QRS duration 90 ms.  PR interval 204 ms.   Recent Labs: 08/31/2020: TSH 2.180 10/25/2020: BNP 599.9 01/07/2021: Hemoglobin 13.9; Platelets 270 01/15/2021: ALT 19; BUN 14; Creatinine, Ser 0.86; Potassium 4.4; Sodium 138  Recent Lipid Panel    Component Value Date/Time   CHOL 140 08/31/2020 0848   TRIG 156 (H) 08/31/2020 0848   HDL 50 08/31/2020 0848   CHOLHDL 2.8 08/31/2020 0848   CHOLHDL 2.7 11/14/2016 0911   VLDL 27 11/14/2016 0911   LDLCALC 63 08/31/2020 0848    Physical Exam:  VS:  BP 120/70 (BP Location: Left Arm, Patient Position: Sitting, Cuff Size: Normal)    Pulse 62    Ht 5\' 6"  (1.676 m)    Wt 165 lb (74.8 kg)    SpO2 98%    BMI 26.63 kg/m     Wt Readings from Last 3 Encounters:  03/06/21 165 lb (74.8 kg)  01/14/21 166 lb (75.3 kg)  01/07/21 169 lb 8 oz (76.9 kg)     GEN:  Well nourished, well developed in no acute distress.  Appears younger than stated age. HEENT: Normal NECK: No JVD; No carotid bruits LYMPHATICS: No lymphadenopathy CARDIAC: RRR, no murmurs, rubs, gallops RESPIRATORY:  Clear to auscultation without rales, wheezing or rhonchi  ABDOMEN: Soft, non-tender, non-distended MUSCULOSKELETAL:  No edema; No deformity  SKIN: Warm and dry NEUROLOGIC:  Alert and oriented x 3 PSYCHIATRIC:  Normal affect       ASSESSMENT:    1. Chronic systolic heart failure ([place])   2. Ischemic cardiomyopathy   3. Coronary artery disease of native artery of native heart with stable angina pectoris ([place])   4. Essential hypertension    PLAN:    In order of problems listed above:  #Chronic systolic heart failure NYHA class III.  Warm dry on exam.  On good medical therapy with Coreg, Lasix, Imdur, Ranexa.  Borderline hypotension prevents use of ACE/ARB.  Also has been hyperkalemic in the past.  We discussed his risk for sudden cardiac  death in the presence of severe ischemic heart disease and chronic systolic heart failure.  We discussed how a defibrillator can provide a layer protection in the event of a life-threatening ventricular arrhythmia.  While he is in his upper 35s, he is quite active and I do think he is a candidate for defibrillator therapy.  We discussed the procedure in detail including risk, recovery and he wishes to proceed.  We will hold his Plavix for 5 days.  The patient has an  ischemic CM (EF 30%), NYHA Class III CHF, and CAD.  He is referred by Cadence Kathlen Mody, PA-C for risk stratification of sudden death and consideration of ICD implantation.  At this time, he meets criteria for ICD implantation for primary prevention of sudden death.  I have had a thorough discussion with the patient reviewing options.  The patient and their family (if available) have had opportunities to ask questions and have them answered. The patient and I have decided together through a shared decision making process to proceed with ICD implant at this time.    Risks, benefits, alternatives to ICD implantation were discussed in detail with the patient today. The patient understands that the risks include but are not limited to bleeding, infection, pneumothorax, perforation, tamponade, vascular damage, renal failure, MI, stroke, death, inappropriate shocks, and lead dislodgement and wishes to proceed.  We will therefore schedule device implantation at the next available time.  We will plan for a VVI ICD with [employer].  #Coronary artery disease No ischemic symptoms today.  Plan to continue aspirin uninterrupted through the defibrillator implant.  We will hold Plavix for 5 days prior to defibrillator implant.  #Hypertension Controlled Continue current regimen. Continue home checks of blood pressure 1-2 times per week    Total time spent with patient today 65 minutes. This includes reviewing records, evaluating the patient and  coordinating care.  Medication Adjustments/Labs and Tests Ordered: Current medicines are reviewed at length with the patient today.  Concerns regarding medicines are outlined above.  Orders  Placed This Encounter  Procedures   EKG 12-Lead    No orders of the defined types were placed in this encounter.    Signed, Hilton Cork. Quentin Ore, MD, [facility], [facility] 03/06/2021 3:14 PM    Electrophysiology [facility]

## 2021-03-07 LAB — BASIC METABOLIC PANEL
BUN/Creatinine Ratio: 24 (ref 10–24)
BUN: 20 mg/dL (ref 8–27)
CO2: 26 mmol/L (ref 20–29)
Calcium: 9.9 mg/dL (ref 8.6–10.2)
Chloride: 97 mmol/L (ref 96–106)
Creatinine, Ser: 0.85 mg/dL (ref 0.76–1.27)
Glucose: 102 mg/dL — ABNORMAL HIGH (ref 70–99)
Potassium: 4.6 mmol/L (ref 3.5–5.2)
Sodium: 139 mmol/L (ref 134–144)
eGFR: 89 mL/min/{1.73_m2} (ref >59)

## 2021-03-07 LAB — CBC WITH DIFFERENTIAL/PLATELET
Basophils Absolute: 0.1 10*3/uL (ref 0.0–0.2)
Basos: 1 %
EOS (ABSOLUTE): 0.1 10*3/uL (ref 0.0–0.4)
Eos: 1 %
Hematocrit: 43.5 % (ref 37.5–51.0)
Hemoglobin: 14.6 g/dL (ref 13.0–17.7)
Immature Grans (Abs): 0.1 10*3/uL (ref 0.0–0.1)
Immature Granulocytes: 1 %
Lymphocytes Absolute: 3.2 10*3/uL — ABNORMAL HIGH (ref 0.7–3.1)
Lymphs: 25 %
MCH: 31.9 pg (ref 26.6–33.0)
MCHC: 33.6 g/dL (ref 31.5–35.7)
MCV: 95 fL (ref 79–97)
Monocytes Absolute: 1.2 10*3/uL — ABNORMAL HIGH (ref 0.1–0.9)
Monocytes: 10 %
Neutrophils Absolute: 8.2 10*3/uL — ABNORMAL HIGH (ref 1.4–7.0)
Neutrophils: 62 %
Platelets: 256 10*3/uL (ref 150–450)
RBC: 4.57 x10E6/uL (ref 4.14–5.80)
RDW: 13.2 % (ref 11.6–15.4)
WBC: 13 10*3/uL — ABNORMAL HIGH (ref 3.4–10.8)

## 2021-03-12 ENCOUNTER — Other Ambulatory Visit: Payer: Self-pay | Admitting: Internal Medicine

## 2021-03-20 ENCOUNTER — Ambulatory Visit: Payer: Medicare Other

## 2021-03-20 ENCOUNTER — Ambulatory Visit
Admission: RE | Admit: 2021-03-20 | Discharge: 2021-03-20 | Disposition: A | Discharge status: Home / Self Care | Payer: Medicare Other | Attending: Cardiology | Admitting: Cardiology

## 2021-03-20 ENCOUNTER — Encounter: Payer: Self-pay | Admitting: Cardiology

## 2021-03-20 ENCOUNTER — Other Ambulatory Visit: Payer: Self-pay

## 2021-03-20 ENCOUNTER — Encounter
Admission: RE | Disposition: A | Discharge status: Home / Self Care | Payer: Self-pay | Source: Home / Self Care | Attending: Cardiology

## 2021-03-20 ENCOUNTER — Other Ambulatory Visit: Payer: Self-pay | Admitting: Medical

## 2021-03-20 DIAGNOSIS — I25118 Atherosclerotic heart disease of native coronary artery with other forms of angina pectoris: Secondary | ICD-10-CM | POA: Diagnosis not present

## 2021-03-20 DIAGNOSIS — E119 Type 2 diabetes mellitus without complications: Secondary | ICD-10-CM | POA: Diagnosis not present

## 2021-03-20 DIAGNOSIS — I5030 Unspecified diastolic (congestive) heart failure: Secondary | ICD-10-CM | POA: Insufficient documentation

## 2021-03-20 DIAGNOSIS — Z7902 Long term (current) use of antithrombotics/antiplatelets: Secondary | ICD-10-CM | POA: Diagnosis not present

## 2021-03-20 DIAGNOSIS — I110 Hypertensive heart disease with heart failure: Secondary | ICD-10-CM | POA: Insufficient documentation

## 2021-03-20 DIAGNOSIS — I5042 Chronic combined systolic (congestive) and diastolic (congestive) heart failure: Secondary | ICD-10-CM | POA: Insufficient documentation

## 2021-03-20 DIAGNOSIS — E782 Mixed hyperlipidemia: Secondary | ICD-10-CM | POA: Diagnosis not present

## 2021-03-20 DIAGNOSIS — I2510 Atherosclerotic heart disease of native coronary artery without angina pectoris: Secondary | ICD-10-CM | POA: Insufficient documentation

## 2021-03-20 DIAGNOSIS — I255 Ischemic cardiomyopathy: Secondary | ICD-10-CM | POA: Diagnosis not present

## 2021-03-20 DIAGNOSIS — I10 Essential (primary) hypertension: Secondary | ICD-10-CM | POA: Diagnosis present

## 2021-03-20 DIAGNOSIS — I5022 Chronic systolic (congestive) heart failure: Secondary | ICD-10-CM | POA: Diagnosis not present

## 2021-03-20 DIAGNOSIS — R0602 Shortness of breath: Secondary | ICD-10-CM | POA: Diagnosis present

## 2021-03-20 DIAGNOSIS — Z95810 Presence of automatic (implantable) cardiac defibrillator: Secondary | ICD-10-CM

## 2021-03-20 DIAGNOSIS — E1169 Type 2 diabetes mellitus with other specified complication: Secondary | ICD-10-CM | POA: Diagnosis present

## 2021-03-20 HISTORY — DX: Ischemic cardiomyopathy: I25.5

## 2021-03-20 HISTORY — PX: ICD IMPLANT: EP1208

## 2021-03-20 LAB — GLUCOSE, CAPILLARY
Glucose-Capillary: 119 mg/dL — ABNORMAL HIGH (ref 70–99)
Glucose-Capillary: 104 mg/dL — ABNORMAL HIGH (ref 70–99)

## 2021-03-20 SURGERY — ICD IMPLANT
Anesthesia: Moderate Sedation

## 2021-03-20 MED ORDER — SODIUM CHLORIDE 0.9 % IV SOLN
INTRAVENOUS | Status: DC | PRN
  Administered 2021-03-20: 09:00:00 80 mg

## 2021-03-20 MED ORDER — ACETAMINOPHEN 325 MG PO TABS
ORAL_TABLET | ORAL | Status: AC
  Filled 2021-03-20: qty 2

## 2021-03-20 MED ORDER — LIDOCAINE HCL 1 % IJ SOLN
INTRAMUSCULAR | Status: AC
  Filled 2021-03-20: qty 20

## 2021-03-20 MED ORDER — HEPARIN (PORCINE) IN NACL 1000-0.9 UT/500ML-% IV SOLN
INTRAVENOUS | Status: DC | PRN
  Administered 2021-03-20: 500 mL

## 2021-03-20 MED ORDER — SODIUM CHLORIDE 0.9 % IV SOLN
80.0000 mg | INTRAVENOUS | Status: DC
  Filled 2021-03-20: qty 2

## 2021-03-20 MED ORDER — ONDANSETRON HCL 4 MG/2ML IJ SOLN: 4.0000 mg | Freq: Four times a day (QID) | INTRAMUSCULAR | Status: DC | PRN

## 2021-03-20 MED ORDER — HEPARIN (PORCINE) IN NACL 1000-0.9 UT/500ML-% IV SOLN
INTRAVENOUS | Status: AC
  Filled 2021-03-20: qty 1000

## 2021-03-20 MED ORDER — CHLORHEXIDINE GLUCONATE CLOTH 2 % EX PADS
6.0000 | MEDICATED_PAD | Freq: Every day | CUTANEOUS | Status: DC
  Administered 2021-03-20: 07:00:00 6 via TOPICAL

## 2021-03-20 MED ORDER — LIDOCAINE HCL (PF) 1 % IJ SOLN
INTRAMUSCULAR | Status: DC | PRN
  Administered 2021-03-20: 30 mL

## 2021-03-20 MED ORDER — LIDOCAINE HCL 1 % IJ SOLN
INTRAMUSCULAR | Status: AC
  Filled 2021-03-20: qty 40

## 2021-03-20 MED ORDER — MIDAZOLAM HCL 2 MG/2ML IJ SOLN
INTRAMUSCULAR | Status: AC
  Filled 2021-03-20: qty 2

## 2021-03-20 MED ORDER — SODIUM CHLORIDE 0.9 % IV SOLN: INTRAVENOUS | Status: DC

## 2021-03-20 MED ORDER — CEFAZOLIN SODIUM-DEXTROSE 1-4 GM/50ML-% IV SOLN
INTRAVENOUS | Status: DC | PRN
  Administered 2021-03-20: 2 g via INTRAVENOUS

## 2021-03-20 MED ORDER — CHLORHEXIDINE GLUCONATE 4 % EX LIQD: 4.0000 "application " | Freq: Once | CUTANEOUS | Status: DC

## 2021-03-20 MED ORDER — SODIUM CHLORIDE 0.9 % IV SOLN
INTRAVENOUS | Status: DC | PRN
  Administered 2021-03-20: 500 mL via INTRAVENOUS

## 2021-03-20 MED ORDER — FENTANYL CITRATE (PF) 100 MCG/2ML IJ SOLN
INTRAMUSCULAR | Status: DC | PRN
  Administered 2021-03-20: 12.5 ug via INTRAVENOUS

## 2021-03-20 MED ORDER — CEFAZOLIN SODIUM-DEXTROSE 2-4 GM/100ML-% IV SOLN
INTRAVENOUS | Status: AC
  Filled 2021-03-20: qty 100

## 2021-03-20 MED ORDER — ACETAMINOPHEN 325 MG PO TABS
325.0000 mg | ORAL_TABLET | ORAL | Status: DC | PRN
  Administered 2021-03-20: 10:00:00 650 mg via ORAL

## 2021-03-20 MED ORDER — POVIDONE-IODINE 10 % EX SWAB: 2.0000 "application " | Freq: Once | CUTANEOUS | Status: DC

## 2021-03-20 MED ORDER — CLOPIDOGREL BISULFATE 75 MG PO TABS: 75.0000 mg | ORAL_TABLET | Freq: Every day | ORAL | 1 refills | Status: DC

## 2021-03-20 MED ORDER — CEFAZOLIN SODIUM-DEXTROSE 2-4 GM/100ML-% IV SOLN: 2.0000 g | INTRAVENOUS | Status: DC

## 2021-03-20 MED ORDER — FENTANYL CITRATE (PF) 100 MCG/2ML IJ SOLN
INTRAMUSCULAR | Status: AC
  Filled 2021-03-20: qty 2

## 2021-03-20 MED ORDER — MIDAZOLAM HCL 2 MG/2ML IJ SOLN
INTRAMUSCULAR | Status: DC | PRN
  Administered 2021-03-20: .5 mg via INTRAVENOUS

## 2021-03-20 SURGICAL SUPPLY — 22 items
CABLE SURG 12 DISP A/V CHANNEL (MISCELLANEOUS) ×3 IMPLANT
CANNULA 5F STIFF (CANNULA) ×1 IMPLANT
COVER SURGICAL LIGHT HANDLE (MISCELLANEOUS) ×1 IMPLANT
ICD VISIA MRI VR DVFB1D4 (ICD Generator) IMPLANT
INTRO PACEMAKR LEAD 9FR 13CM (INTRODUCER) ×2
INTRO PACEMAKR LEAD 9FR 23CM (INTRODUCER) ×2
INTRODUCER PACEMKR LD 9FR 13CM (INTRODUCER) IMPLANT
INTRODUCER PACEMKR LD 9FR 23CM (INTRODUCER) IMPLANT
LEAD SPRINT QUAT SEC 6935M-62 (Lead) ×1 IMPLANT
PAD ELECT DEFIB RADIOL ZOLL (MISCELLANEOUS) ×3 IMPLANT
POUCH AIGIS-R ANTIBACT ICD (Mesh General) ×2 IMPLANT
POUCH AIGIS-R ANTIBACT ICD LRG (Mesh General) IMPLANT
SLING ARM IMMOBILIZER LRG (SOFTGOODS) ×1 IMPLANT
SUT ETHIBOND CT1 BRD #0 30IN (SUTURE) ×1 IMPLANT
SUT PROLENE 0 CT 1 30 (SUTURE) ×1 IMPLANT
SUT VIC AB 2-0 CT1 27 (SUTURE) ×1
SUT VIC AB 2-0 CT1 TAPERPNT 27 (SUTURE) IMPLANT
SUT VIC AB 3-0 CT1 27 (SUTURE) ×1
SUT VIC AB 3-0 CT1 TAPERPNT 27 (SUTURE) IMPLANT
TRAY PACEMAKER INSERTION (PACKS) ×2 IMPLANT
VISIA MRI VR DVFB1D4 (ICD Generator) ×2 IMPLANT
WIRE HITORQ VERSACORE ST 145CM (WIRE) ×1 IMPLANT

## 2021-03-20 NOTE — Discharge Summary (Signed)
Discharge Summary    Patient ID: Jacob Harrison MRN: [id number]; DOB: Jan 23, 1941  Admit date: 03/20/2021 Discharge date: 03/20/2021  PCP:  [name], PA-C   [facility] Providers Cardiologist:  Nelva Bush, MD  Electrophysiologist:  Vickie Epley, MD       Discharge Diagnoses    Principal Problem:   Ischemic cardiomyopathy Active Problems:   (HFpEF) heart failure with preserved ejection fraction ([place])   Coronary artery disease   Type 2 diabetes mellitus without complication, without long-term current use of insulin ([place])   Essential hypertension   Mixed hyperlipidemia  Diagnostic Studies/Procedures    AICD Placement 12.13.2022  CONCLUSIONS:  1. Ischemic cardiomyopathy with chronic New York Heart Association class II-III heart failure.  2. Successful ICD implantation with a MDT VVI single coil ICD implanted for prevention of sudden death (Visia AF MRI VR SureScan, serial [id number]) 3. No early apparent complications.  _____________   History of Present Illness     Jacob Harrison is a 80 y.o. male with a h/o CAD, HTN, HL, ICM, HFrEF, and DMII.  He is s/p CABG and redo CABG.  In 09/2020, he suffered a NSTEMI w/ cath revealing mod-severe multivessel CAD w/ recommendation for medical therapy.  Echo in 11/2020 showed EF of 30-35%.  Medical therapy has been limited by relative hypotension.  Repeat echo in Nov. 2022 continued to show LV dysfunction w/ an EF of 30-35%.  In light of ongoing LV dysfunction, he was referred to electrophysiology for consideration of ICD placement and was seen by Dr. Quentin Ore on 11/30 with plans to proceed.  Hospital Course     Consultants: None   Patient presented to the [facility] EP lab on 12/13 and underwent successful placement of a [employer] Visia AF MRI VR SureScan single lead AICD (serial [id number]).  He tolerated the procedure well and post-procedure CXR is without any acute abnormalities.  He is being discharged home  from [facility] this afternoon in good condition with follow-up arranged in our -based device clinic in 2 wks.  He has been instructed to hold clopidogrel until 03/26/2021.  Did the patient have an acute coronary syndrome (MI, NSTEMI, STEMI, etc) this admission?:  No                               Did the patient have a percutaneous coronary intervention (stent / angioplasty)?:  No.    _____________  Discharge Vitals Blood pressure 107/62, pulse 61, temperature 97.7 F (36.5 C), temperature source Oral, resp. rate 15, height 5\' 6"  (1.676 m), weight 73.5 kg, SpO2 99 %.  Filed Weights   03/20/21 0707  Weight: 73.5 kg    Labs & Radiologic Studies    ------------------- _____________      Disposition   Pt is being discharged home today in good condition.  Follow-up Plans & Appointments     Follow-up Information     [facility] [place] [facility] Follow up on 04/03/2021.   Specialty: Cardiology Why: 2 PM for Device Clinic check Contact information: [address], [place] [phone]        Kathlen Mody, Cadence H, PA-C Follow up on 04/26/2021.   Specialty: Cardiology Why: 11:20 AM Contact information: [address] [place] [address] [phone]         Vickie Epley, MD Follow up on 06/19/2021.   Specialties: Cardiology, Radiology Why: 11:40 AM Contact  information: [place] [address] [phone]                Discharge Instructions     Call MD for:  difficulty breathing, headache or visual disturbances   Complete by: As directed    Call MD for:  redness, tenderness, or signs of infection (pain, swelling, redness, odor or green/yellow discharge around incision site)   Complete by: As directed    Call MD for:  severe uncontrolled pain   Complete by: As directed    Call MD for:  temperature >100.4   Complete by: As directed    Increase activity slowly    Complete by: As directed        Discharge Medications   Allergies as of 03/20/2021       Reactions   Other Itching, Rash   Steroid injection in 2021 caused rash/itching   Tetracyclines & Related Rash        Medication List     TAKE these medications    acetaminophen 500 MG tablet Commonly known as: TYLENOL Take 1,000 mg by mouth every 6 (six) hours as needed for moderate pain or headache.   aspirin EC 81 MG tablet Take 1 tablet (81 mg total) by mouth daily.   carvedilol 12.5 MG tablet Commonly known as: COREG TAKE 1 TABLET (12.5 MG TOTAL) BY MOUTH 2 (TWO) TIMES DAILY WITH A MEAL.   clopidogrel 75 MG tablet Commonly known as: PLAVIX Take 1 tablet (75 mg total) by mouth daily. **NO PLAVIX UNTIL 12/20** What changed: additional instructions   Co Q-10 200 MG Caps Take 200 mg by mouth daily.   diclofenac Sodium 1 % Gel Commonly known as: VOLTAREN Apply 2 g topically 2 (two) times daily as needed.   ezetimibe 10 MG tablet Commonly known as: ZETIA TAKE 1 TABLET BY MOUTH EVERY DAY   finasteride 5 MG tablet Commonly known as: PROSCAR TAKE 1 TABLET BY MOUTH EVERY DAY   furosemide 40 MG tablet Commonly known as: LASIX TAKE 1 TABLET BY MOUTH TWICE A DAY   Glucosamine 1500 Complex Caps Take 1 capsule by mouth daily.   isosorbide mononitrate 30 MG 24 hr tablet Commonly known as: IMDUR TAKE 0.5 TABLETS (15 MG TOTAL) BY MOUTH AT BEDTIME.   metFORMIN 500 MG tablet Commonly known as: GLUCOPHAGE 1/2 tablet by mouth daily with food   nitroGLYCERIN 0.4 MG SL tablet Commonly known as: NITROSTAT Place 1 tablet (0.4 mg total) under the tongue every 5 (five) minutes as needed.   onetouch ultrasoft lancets Use to check blood sugars fasting daily   PRESERVISION AREDS 2 PO Take 1 capsule by mouth 2 (two) times a day.   ranolazine 1000 MG SR tablet Commonly known as: RANEXA TAKE 1 TABLET BY MOUTH TWICE A DAY   rosuvastatin 20 MG tablet Commonly known as:  CRESTOR TAKE 1 TABLET BY MOUTH EVERYDAY AT BEDTIME   vitamin B-12 500 MCG tablet Commonly known as: CYANOCOBALAMIN Take 500 mcg by mouth daily.   VITAMIN D-3 PO Take 800 Units by mouth daily.           Outstanding Labs/Studies   None  Duration of Discharge Encounter   Greater than 30 minutes including physician time.  Signed, Murray Hodgkins, NP 03/20/2021, 12:19 PM

## 2021-03-20 NOTE — Discharge Instructions (Addendum)
**  PLEASE HOLD PLAVIX X 5 DAYS. YOU MAY RESUME ON 12/20**     Supplemental Discharge Instructions for  Pacemaker/Defibrillator Patients  Activity No heavy lifting or vigorous activity with your left/right arm for 6 to 8 weeks.  Do not raise your left/right arm above your head for one week.  Gradually raise your affected arm as drawn below.           _____12/19__________ 12/20______________12/21______________12/22____________  NO DRIVING for     ; you may begin driving on   09/38  .  WOUND CARE Keep the wound area clean and dry.  Do not get this area wet for one week. No showers for one week; you may shower on  12/22 . The tape/steri-strips on your wound will fall off; do not pull them off.  No bandage is needed on the site.  DO  NOT apply any creams, oils, or ointments to the wound area. If you notice any drainage or discharge from the wound, any swelling or bruising at the site, or you develop a fever > 101? F after you are discharged home, call the office at once.  Special Instructions You are still able to use cellular telephones; use the ear opposite the side where you have your pacemaker/defibrillator.  Avoid carrying your cellular phone near your device. When traveling through airports, show security personnel your identification card to avoid being screened in the metal detectors.  Ask the security personnel to use the hand wand. Avoid arc welding equipment, MRI testing (magnetic resonance imaging), TENS units (transcutaneous nerve stimulators).  Call the office for questions about other devices. Avoid electrical appliances that are in poor condition or are not properly grounded. Microwave ovens are safe to be near or to operate.  Additional information for defibrillator patients should your device go off: If your device goes off ONCE and you feel fine afterward, notify the device clinic nurses. If your device goes off ONCE and you do not feel well afterward, call 911. If your  device goes off TWICE, call 911. If your device goes off THREE times in one day, call 911.  DO NOT DRIVE YOURSELF OR A FAMILY MEMBER WITH A DEFIBRILLATOR TO THE HOSPITAL--CALL 911.

## 2021-03-20 NOTE — Interval H&P Note (Signed)
History and Physical Interval Note:  03/20/2021 7:02 AM  Jacob Harrison  has presented today for surgery, with the diagnosis of ICD Insert (single lead)   [employer]   Ischemic Cardiomyopathy.  The various methods of treatment have been discussed with the patient and family. After consideration of risks, benefits and other options for treatment, the patient has consented to  Procedure(s): ICD IMPLANT (N/A) as a surgical intervention.  The patient's history has been reviewed, patient examined, no change in status, stable for surgery.  I have reviewed the patient's chart and labs.  Questions were answered to the patient's satisfaction.     Jacob Harrison T Jacob Harrison

## 2021-03-21 ENCOUNTER — Other Ambulatory Visit: Payer: Self-pay | Admitting: Internal Medicine

## 2021-04-02 LAB — CUP PACEART REMOTE DEVICE CHECK
Battery Remaining Longevity: 132 mo
Battery Voltage: 3.05 V
Brady Statistic RV Percent Paced: 0.15 %
Date Time Interrogation Session: 20221226092446
HighPow Impedance: 59 Ohm
Implantable Lead Implant Date: 20221214
Implantable Lead Location: 753860
Implantable Pulse Generator Implant Date: 20221214
Lead Channel Impedance Value: 380 Ohm
Lead Channel Impedance Value: 437 Ohm
Lead Channel Pacing Threshold Amplitude: 0.625 V
Lead Channel Pacing Threshold Pulse Width: 0.4 ms
Lead Channel Sensing Intrinsic Amplitude: 11.875 mV
Lead Channel Sensing Intrinsic Amplitude: 11.875 mV
Lead Channel Setting Pacing Amplitude: 3.5 V
Lead Channel Setting Pacing Pulse Width: 0.4 ms
Lead Channel Setting Sensing Sensitivity: 0.3 mV

## 2021-04-03 ENCOUNTER — Ambulatory Visit (INDEPENDENT_AMBULATORY_CARE_PROVIDER_SITE_OTHER): Payer: Medicare Other

## 2021-04-03 ENCOUNTER — Other Ambulatory Visit: Payer: Self-pay

## 2021-04-03 DIAGNOSIS — I255 Ischemic cardiomyopathy: Secondary | ICD-10-CM

## 2021-04-03 NOTE — Patient Instructions (Signed)
   After Your ICD (Implantable Cardiac Defibrillator)    Monitor your defibrillator site for redness, swelling, and drainage. Call the device clinic at [phone] if you experience these symptoms or fever/chills.  Your incision was closed with Steri-strips or staples:  You may shower 7 days after your procedure and wash your incision with soap and water. Avoid lotions, ointments, or perfumes over your incision until it is well-healed.    You may use a hot tub or a pool after your wound check appointment if the incision is completely closed.  Do not lift, push or pull greater than 10 pounds with the affected arm until 6 weeks after your procedure. There are no other restrictions in arm movement after your wound check appointment.  Your ICD is MRI compatible.  Your ICD is designed to protect you from life threatening heart rhythms. Because of this, you may receive a shock.   1 shock with no symptoms:  Call the office during business hours. 1 shock with symptoms (chest pain, chest pressure, dizziness, lightheadedness, shortness of breath, overall feeling unwell):  Call 911. If you experience 2 or more shocks in 24 hours:  Call 911. If you receive a shock, you should not drive.  [place] DMV - no driving for 6 months if you receive appropriate therapy from your ICD.   ICD Alerts:  Some alerts are vibratory and others beep. These are NOT emergencies. Please call our office to let us know. If this occurs at night or on weekends, it can wait until the next business day. Send a remote transmission.  If your device is capable of reading fluid status (for heart failure), you will be offered monthly monitoring to review this with you.   Remote monitoring is used to monitor your ICD from home. This monitoring is scheduled every 91 days by our office. It allows us to keep an eye on the functioning of your device to ensure it is working properly. You will routinely see your Electrophysiologist annually  (more often if necessary).   

## 2021-04-05 ENCOUNTER — Other Ambulatory Visit: Payer: Self-pay | Admitting: Internal Medicine

## 2021-04-05 DIAGNOSIS — I25118 Atherosclerotic heart disease of native coronary artery with other forms of angina pectoris: Secondary | ICD-10-CM

## 2021-04-05 LAB — CUP PACEART INCLINIC DEVICE CHECK
Battery Remaining Longevity: 132 mo
Battery Voltage: 3.05 V
Brady Statistic RV Percent Paced: 0.21 %
Date Time Interrogation Session: 20221228140900
HighPow Impedance: 62 Ohm
Implantable Lead Implant Date: 20221214
Implantable Lead Location: 753860
Implantable Pulse Generator Implant Date: 20221214
Lead Channel Impedance Value: 323 Ohm
Lead Channel Impedance Value: 437 Ohm
Lead Channel Pacing Threshold Amplitude: 0.75 V
Lead Channel Pacing Threshold Pulse Width: 0.4 ms
Lead Channel Sensing Intrinsic Amplitude: 16.125 mV
Lead Channel Setting Pacing Amplitude: 3.5 V
Lead Channel Setting Pacing Pulse Width: 0.4 ms
Lead Channel Setting Sensing Sensitivity: 0.3 mV

## 2021-04-05 NOTE — Progress Notes (Signed)
Wound check appointment. Steri-strips removed. Wound without redness. Firm hematoma noted. Patient resumed plavix as directed 03/25/21. Dr. Quentin Ore in to assess. No new orders. Continue to monitor and notify device if changes noted in size. Incision edges approximated, wound well healed. Normal device function. Thresholds, sensing, and impedances consistent with implant measurements. Device programmed at 3.5V for extra safety margin until 3 month visit. Histogram distribution appropriate for patient and level of activity. No ventricular arrhythmias noted. Patient educated about wound care, arm mobility, lifting restrictions, shock plan. Patient is enrolled in remote monitoring with next transmission scheduled 06/19/21. 91 day FU with Dr. Caryl Comes 06/19/21.

## 2021-04-09 ENCOUNTER — Other Ambulatory Visit: Payer: Self-pay | Admitting: Internal Medicine

## 2021-04-10 ENCOUNTER — Encounter: Payer: Self-pay | Admitting: Physician Assistant

## 2021-04-10 ENCOUNTER — Other Ambulatory Visit: Payer: Self-pay

## 2021-04-10 ENCOUNTER — Ambulatory Visit (INDEPENDENT_AMBULATORY_CARE_PROVIDER_SITE_OTHER): Payer: Medicare Other | Admitting: Physician Assistant

## 2021-04-10 ENCOUNTER — Other Ambulatory Visit: Payer: Self-pay | Admitting: Internal Medicine

## 2021-04-10 VITALS — BP 106/56 | HR 54 | Temp 98.1°F | Ht 66.0 in | Wt 162.0 lb

## 2021-04-10 DIAGNOSIS — Z1211 Encounter for screening for malignant neoplasm of colon: Secondary | ICD-10-CM

## 2021-04-10 DIAGNOSIS — Z0000 Encounter for general adult medical examination without abnormal findings: Secondary | ICD-10-CM

## 2021-04-10 NOTE — Patient Instructions (Signed)
Preventive Care 65 Years and Older, Male °Preventive care refers to lifestyle choices and visits with your health care provider that can promote health and wellness. Preventive care visits are also called wellness exams. °What can I expect for my preventive care visit? °Counseling °During your preventive care visit, your health care provider may ask about your: °Medical history, including: °Past medical problems. °Family medical history. °History of falls. °Current health, including: °Emotional well-being. °Home life and relationship well-being. °Sexual activity. °Memory and ability to understand (cognition). °Lifestyle, including: °Alcohol, nicotine or tobacco, and drug use. °Access to firearms. °Diet, exercise, and sleep habits. °Work and work environment. °Sunscreen use. °Safety issues such as seatbelt and bike helmet use. °Physical exam °Your health care provider will check your: °Height and weight. These may be used to calculate your BMI (body mass index). BMI is a measurement that tells if you are at a healthy weight. °Waist circumference. This measures the distance around your waistline. This measurement also tells if you are at a healthy weight and may help predict your risk of certain diseases, such as type 2 diabetes and high blood pressure. °Heart rate and blood pressure. °Body temperature. °Skin for abnormal spots. °What immunizations do I need? °Vaccines are usually given at various ages, according to a schedule. Your health care provider will recommend vaccines for you based on your age, medical history, and lifestyle or other factors, such as travel or where you work. °What tests do I need? °Screening °Your health care provider may recommend screening tests for certain conditions. This may include: °Lipid and cholesterol levels. °Diabetes screening. This is done by checking your blood sugar (glucose) after you have not eaten for a while (fasting). °Hepatitis C test. °Hepatitis B test. °HIV (human  immunodeficiency virus) test. °STI (sexually transmitted infection) testing, if you are at risk. °Lung cancer screening. °Colorectal cancer screening. °Prostate cancer screening. °Abdominal aortic aneurysm (AAA) screening. You may need this if you are a current or former smoker. °Talk with your health care provider about your test results, treatment options, and if necessary, the need for more tests. °Follow these instructions at home: °Eating and drinking ° °Eat a diet that includes fresh fruits and vegetables, whole grains, lean protein, and low-fat dairy products. Limit your intake of foods with high amounts of sugar, saturated fats, and salt. °Take vitamin and mineral supplements as recommended by your health care provider. °Do not drink alcohol if your health care provider tells you not to drink. °If you drink alcohol: °Limit how much you have to 0-2 drinks a day. °Know how much alcohol is in your drink. In the [place] one drink equals one 12 oz bottle of beer (355 mL), one 5 oz glass of wine (148 mL), or one 1½ oz glass of hard liquor (44 mL). °Lifestyle °Brush your teeth every morning and night with fluoride toothpaste. Floss one time each day. °Exercise for at least 30 minutes 5 or more days each week. °Do not use any products that contain nicotine or tobacco. These products include cigarettes, chewing tobacco, and vaping devices, such as e-cigarettes. If you need help quitting, ask your health care provider. °Do not use drugs. °If you are sexually active, practice safe sex. Use a condom or other form of protection to prevent STIs. °Take aspirin only as told by your health care provider. Make sure that you understand how much to take and what form to take. Work with your health care provider to find out whether it is safe and   beneficial for you to take aspirin daily. °Ask your health care provider if you need to take a cholesterol-lowering medicine (statin). °Find healthy ways to manage stress, such  as: °Meditation, yoga, or listening to music. °Journaling. °Talking to a trusted person. °Spending time with friends and family. °Safety °Always wear your seat belt while driving or riding in a vehicle. °Do not drive: °If you have been drinking alcohol. Do not ride with someone who has been drinking. °When you are tired or distracted. °While texting. °If you have been using any mind-altering substances or drugs. °Wear a helmet and other protective equipment during sports activities. °If you have firearms in your house, make sure you follow all gun safety procedures. °Minimize exposure to UV radiation to reduce your risk of skin cancer. °What's next? °Visit your health care provider once a year for an annual wellness visit. °Ask your health care provider how often you should have your eyes and teeth checked. °Stay up to date on all vaccines. °This information is not intended to replace advice given to you by your health care provider. Make sure you discuss any questions you have with your health care provider. °Document Revised: 09/19/2020 Document Reviewed: 09/19/2020 °[employer]vier Patient Education © 2022 [employer] ° °

## 2021-04-10 NOTE — Progress Notes (Signed)
Subjective:   Jacob Harrison is a 81 y.o. male who presents for Medicare Annual/Subsequent preventive examination.  Review of Systems    Refer to PCP   I connected with  [name] on 04/10/21 by an audio only telemedicine application and verified that I am speaking with the correct person using two identifiers.   I discussed the limitations, risks, security and privacy concerns of performing an evaluation and management service by telephone and the availability of in person appointments. I also discussed with the patient that there may be a patient responsible charge related to this service. The patient expressed understanding and verbally consented to this telephonic visit.  Location of Patient: Home Location of Provider: Office  List any persons and their role that are participating in the visit with the patient.    Ayn Domangue, CMA     Objective:    Today's Vitals   04/10/21 0958  BP: (!) 106/56  Pulse: (!) 54  Temp: 98.1 F (36.7 C)  SpO2: 97%  Weight: 162 lb (73.5 kg)  Height: 5\' 6"  (1.676 m)   Body mass index is 26.15 kg/m.  Advanced Directives 03/20/2021 11/02/2020 10/04/2020 10/04/2020 07/12/2020 10/20/2018 10/20/2018  Does Patient Have a Medical Advance Directive? Yes Yes Yes Yes Yes Yes Yes  Type of Paramedic of [place];Living will [place];Living will Living will - [place];Living will - [place];Living will  Does patient want to make changes to medical advance directive? No - Guardian declined - No - Patient declined No - Patient declined - No - Patient declined -  Copy of [place] in Chart? No - copy requested - - - - - No - copy requested    Current Medications (verified) Outpatient Encounter Medications as of 04/10/2021  Medication Sig   acetaminophen (TYLENOL) 500 MG tablet Take 1,000 mg by mouth every 6 (six) hours as needed for  moderate pain or headache.   aspirin EC 81 MG tablet Take 1 tablet (81 mg total) by mouth daily.   carvedilol (COREG) 12.5 MG tablet TAKE 1 TABLET (12.5 MG TOTAL) BY MOUTH 2 (TWO) TIMES DAILY WITH A MEAL.   Cholecalciferol (VITAMIN D-3 PO) Take 800 Units by mouth daily.   clopidogrel (PLAVIX) 75 MG tablet Take 1 tablet (75 mg total) by mouth daily. **NO PLAVIX UNTIL 12/20**   Coenzyme Q10 (CO Q-10) 200 MG CAPS Take 200 mg by mouth daily.    cyanocobalamin 500 MCG tablet Take 500 mcg by mouth daily.   diclofenac Sodium (VOLTAREN) 1 % GEL Apply 2 g topically 2 (two) times daily as needed.   ezetimibe (ZETIA) 10 MG tablet TAKE 1 TABLET BY MOUTH EVERY DAY   finasteride (PROSCAR) 5 MG tablet TAKE 1 TABLET BY MOUTH EVERY DAY   furosemide (LASIX) 40 MG tablet TAKE 1 TABLET BY MOUTH EVERY DAY   Glucosamine-Chondroit-Vit C-Mn (GLUCOSAMINE 1500 COMPLEX) CAPS Take 1 capsule by mouth daily.   isosorbide mononitrate (IMDUR) 30 MG 24 hr tablet TAKE 0.5 TABLETS (15 MG TOTAL) BY MOUTH AT BEDTIME.   Lancets (ONETOUCH ULTRASOFT) lancets Use to check blood sugars fasting daily   metFORMIN (GLUCOPHAGE) 500 MG tablet 1/2 tablet by mouth daily with food   Multiple Vitamins-Minerals (PRESERVISION AREDS 2 PO) Take 1 capsule by mouth 2 (two) times a day.   nitroGLYCERIN (NITROSTAT) 0.4 MG SL tablet PLACE 1 TABLET UNDER THE TONGUE EVERY 5 MINUTES AS NEEDED.  ranolazine (RANEXA) 1000 MG SR tablet TAKE 1 TABLET BY MOUTH TWICE A DAY   rosuvastatin (CRESTOR) 20 MG tablet TAKE 1 TABLET BY MOUTH EVERYDAY AT BEDTIME   No facility-administered encounter medications on file as of 04/10/2021.    Allergies (verified) Other and Tetracyclines & related   History: Past Medical History:  Diagnosis Date   (HFpEF) heart failure with preserved ejection fraction ([place])    a. 10/2016 Echo: EF 65-70%, mild LVH, mildly dil LA. RV fxn nl.   Arthritis    BPH (benign prostatic hyperplasia)    Coronary artery disease    a. 2001 CABG x 3  [facility]): LIMA->LAD, VG->LCX, VG->RPDA; b. 04/05/2010 Cath ([place]): LM 71m, LAD 193m, LCX 38m, RCA 100p, LIMA->LAD atretic, VG->LCX 100, VG->RCA 80ost, 70d; c. 04/2010 Redo CABG x 2 ([place]): VG->LAD, VG->RPDA; d. 10/2012 Cath [facility]): LM 20-30, LAD 100/95-9m, LCX 40-47m, RCA 100p, 80-100d, RPDA 99, RPL1 95, VG->LAD ok, VG->RPDA ok, EF 67%; e. 11/2016 MV: EF 48%, No ischemia.   Diabetes mellitus without complication ([place])    Type II   Hyperlipidemia    Hypertension    Ischemic cardiomyopathy    a. 03/2021 s/p MDT Visia AF MRI VR SureScan single lead AICD (ser# [id number]).   Scoliosis    Spinal stenosis    Past Surgical History:  Procedure Laterality Date   CARDIAC CATHETERIZATION     CORONARY ARTERY BYPASS GRAFT     x 2 (2001 and redo in late 2011 or early 2012)   HERNIA REPAIR Left    inguinal   ICD IMPLANT N/A 03/20/2021   Procedure: ICD IMPLANT;  Surgeon: Vickie Epley, MD;  Location: [place] [facility];  Service: Cardiovascular;  Laterality: N/A;   LEFT HEART CATH AND CORS/GRAFTS ANGIOGRAPHY N/A 10/04/2020   Procedure: LEFT HEART CATH AND CORS/GRAFTS ANGIOGRAPHY;  Surgeon: Leonie Man, MD;  Location: [facility];  Service: Cardiovascular;  Laterality: N/A;   LUMBAR LAMINECTOMY/DECOMPRESSION MICRODISCECTOMY N/A 10/20/2018   Procedure: L3-4, L4-5 LUMBAR DECOMPRESSION with dural repair.;  Surgeon: Marybelle Killings, MD;  Location: [place];  Service: Orthopedics;  Laterality: N/A;   TONSILLECTOMY AND ADENOIDECTOMY     TRANSURETHRAL RESECTION OF PROSTATE  1990   Family History  Problem Relation Age of Onset   Diabetes Mother    Hyperlipidemia Mother    Cancer Father        colon   Alcohol abuse Maternal Uncle    Diabetes Maternal Uncle    Hyperlipidemia Maternal Uncle    Cancer Paternal Uncle        colon   Social History   Socioeconomic History   Marital status: Married    Spouse name: Glenda   Number of children: 2   Years of education: Not on file    Highest education level: Not on file  Occupational History   Occupation: retired  Tobacco Use   Smoking status: Never   Smokeless tobacco: Never  Vaping Use   Vaping Use: Never used  Substance and Sexual Activity   Alcohol use: Not Currently    Alcohol/week: 11.0 standard drinks    Types: 5 Glasses of wine, 6 Cans of beer per week    Comment: 5-7 drinks a week (was 10-15 drinks/week)   Drug use: No   Sexual activity: Not Currently    Birth control/protection: None  Other Topics Concern   Not on file  Social History Narrative   Not on file   Social Determinants of Health  Financial Resource Strain: Low Risk    Difficulty of Paying Living Expenses: Not very hard  Food Insecurity: No Food Insecurity   Worried About Charity fundraiser in the Last Year: Never true   Ran Out of Food in the Last Year: Never true  Transportation Needs: No Transportation Needs   Lack of Transportation (Medical): No   Lack of Transportation (Non-Medical): No  Physical Activity: Insufficiently Active   Days of Exercise per Week: 3 days   Minutes of Exercise per Session: 20 min  Stress: No Stress Concern Present   Feeling of Stress : Not at all  Social Connections: Moderately Integrated   Frequency of Communication with Friends and Family: More than three times a week   Frequency of Social Gatherings with Friends and Family: Twice a week   Attends Religious Services: Never   Marine scientist or Organizations: Yes   Attends Music therapist: More than 4 times per year   Marital Status: Married    Tobacco Counseling Counseling given: Not Answered   Clinical Intake:                 Diabetic?Yes         Activities of Daily Living In your present state of health, do you have any difficulty performing the following activities: 04/10/2021 03/20/2021  Hearing? Y N  Vision? N N  Difficulty concentrating or making decisions? Y N  Walking or climbing stairs? Y N   Dressing or bathing? N N  Doing errands, shopping? N -  Some recent data might be hidden    Patient Care Team: [name], PA-C as PCP - General (Physician Assistant) End, Harrell Gave, MD as PCP - Cardiology (Cardiology) Vickie Epley, MD as PCP - Electrophysiology (Cardiology) Warren Danes, PA-C as Physician Assistant (Dermatology)  Indicate any recent Medical Services you may have received from other than [facility] providers in the past year (date may be approximate).     Assessment:   This is a routine wellness examination for [place].  Hearing/Vision screen No results found.  Dietary issues and exercise activities discussed:     Goals Addressed   None   Depression Screen PHQ 2/9 Scores 04/10/2021 01/09/2021 01/07/2021 12/03/2020 11/08/2020 09/04/2020 07/03/2020  PHQ - 2 Score 0 1 1 1 1  0 0  PHQ- 9 Score 0 5 4 4 7 3 2     Fall Risk Fall Risk  04/10/2021 01/07/2021 11/02/2020 09/04/2020 07/12/2020  Falls in the past year? 1 0 0 0 0  Number falls in past yr: 1 0 - 0 0  Injury with Fall? 0 0 - 0 0  Risk for fall due to : History of fall(s) No Fall Risks No Fall Risks Impaired balance/gait -  Follow up Falls evaluation completed Falls evaluation completed Education provided;Falls prevention discussed Falls evaluation completed -    FALL RISK PREVENTION PERTAINING TO THE HOME:  Any stairs in or around the home? Yes  If so, are there any without handrails? Yes  Home free of loose throw rugs in walkways, pet beds, electrical cords, etc? Yes  Adequate lighting in your home to reduce risk of falls? Yes   ASSISTIVE DEVICES UTILIZED TO PREVENT FALLS:  Life alert? No  Use of a cane, walker or w/c? Yes  Grab bars in the bathroom? Yes  Shower chair or bench in shower? Yes  Elevated toilet seat or a handicapped toilet? No   TIMED UP AND GO:  Was the  test performed? No .    Telehealth  Cognitive Function:     6CIT Screen 04/10/2021 04/18/2020 08/17/2018  What Year? 0  points 0 points 0 points  What month? 0 points 0 points 0 points  What time? 0 points 0 points 0 points  Count back from 20 0 points 0 points 0 points  Months in reverse 0 points 0 points 0 points  Repeat phrase 0 points 0 points 0 points  Total Score 0 0 0    Immunizations Immunization History  Administered Date(s) Administered   Fluad Quad(high Dose 65+) 01/07/2021   Influenza, High Dose Seasonal PF 02/25/2017, 04/14/2018, 01/06/2019   Influenza-Unspecified 04/08/2016, 01/06/2019, 04/05/2020   PFIZER(Purple Top)SARS-COV-2 Vaccination 05/11/2019, 06/01/2019   Pneumococcal Conjugate-13 04/07/2006   Pneumococcal Polysaccharide-23 11/27/2016   Td 04/07/2014    TDAP status: Up to date  Flu Vaccine status: Up to date  Pneumococcal vaccine status: Up to date  Covid-19 vaccine status: Completed vaccines  Qualifies for Shingles Vaccine? Yes   Zostavax completed No   Shingrix Completed?: No.    Education has been provided regarding the importance of this vaccine. Patient has been advised to call insurance company to determine out of pocket expense if they have not yet received this vaccine. Advised may also receive vaccine at local pharmacy or Health Dept. Verbalized acceptance and understanding.  Screening Tests Health Maintenance  Topic Date Due   Hepatitis C Screening  Never done   Zoster Vaccines- Shingrix (1 of 2) Never done   COVID-19 Vaccine (3 - Pfizer risk series) 06/29/2019   URINE MICROALBUMIN  04/16/2021   OPHTHALMOLOGY EXAM  05/15/2021   HEMOGLOBIN A1C  07/08/2021   FOOT EXAM  09/04/2021   TETANUS/TDAP  04/07/2024   Pneumonia Vaccine 17+ Years old  Completed   INFLUENZA VACCINE  Completed   HPV VACCINES  Aged Out   COLONOSCOPY (Pts 45-6yrs Insurance coverage will need to be confirmed)  Discontinued    Health Maintenance  Health Maintenance Due  Topic Date Due   Hepatitis C Screening  Never done   Zoster Vaccines- Shingrix (1 of 2) Never done   COVID-19  Vaccine (3 - Pfizer risk series) 06/29/2019   URINE MICROALBUMIN  04/16/2021    Colorectal cancer screening: Referral to GI placed ORDERING COLOGUARD. Pt aware the office will call re: appt.  Lung Cancer Screening: (Low Dose CT Chest recommended if Age 30-80 years, 30 pack-year currently smoking OR have quit w/in 15years.) does not qualify.   Lung Cancer Screening Referral:   Additional Screening:  Hepatitis C Screening: does qualify; Patient declined  Vision Screening: Recommended annual ophthalmology exams for early detection of glaucoma and other disorders of the eye. Is the patient up to date with their annual eye exam?  Yes  Who is the provider or what is the name of the office in which the patient attends annual eye exams? Dr. Elmer Sow If pt is not established with a provider, would they like to be referred to a provider to establish care? No .   Dental Screening: Recommended annual dental exams for proper oral hygiene  Community Resource Referral / Chronic Care Management: CRR required this visit?  No   CCM required this visit?  No      Plan:     I have personally reviewed and noted the following in the patients chart:   Medical and social history Use of alcohol, tobacco or illicit drugs  Current medications and supplements including opioid prescriptions. Patient is  not currently taking opioid prescriptions. Functional ability and status Nutritional status Physical activity Advanced directives List of other physicians Hospitalizations, surgeries, and ER visits in previous 12 months Vitals Screenings to include cognitive, depression, and falls Referrals and appointments  In addition, I have reviewed and discussed with patient certain preventive protocols, quality metrics, and best practice recommendations. A written personalized care plan for preventive services as well as general preventive health recommendations were provided to patient.     [place],  Zalma   04/10/2021   Nurse Notes: Patient completed Medicare Wellness without any issues. Patient declined to speak to [facility] at this time. No abnormal findings.

## 2021-04-10 NOTE — Addendum Note (Signed)
Addended by: [name] on: 04/10/2021 12:19 PM   Modules accepted: Level of Service

## 2021-04-11 ENCOUNTER — Ambulatory Visit: Payer: Medicare Other | Admitting: Physician Assistant

## 2021-04-22 DIAGNOSIS — Z1211 Encounter for screening for malignant neoplasm of colon: Secondary | ICD-10-CM | POA: Diagnosis not present

## 2021-04-23 NOTE — Progress Notes (Signed)
Cardiology Office Note:    Date:  04/26/2021   ID:  Jacob Harrison, DOB Jan 10, 1941, MRN [id number]  PCP:  [name], PA-C  [facility] Cardiologist:  Nelva Bush, MD  [facility] Electrophysiologist:  Vickie Epley, MD   Referring MD: [name], PA-C   Chief Complaint: 3 month follow-up  History of Present Illness:    Jacob Harrison is a 81 y.o. male with a hx of with a hx of CAD s/p CABG and redo CABG, DM2, HFpEF, HLD, chronic leg pain due to spinal stenosis, ICM, HFrEF who presents for follow-up.    Seen in November 2020 with bradycardia recommended to cut back on Carvedilol. Due to suboptimal BP control, Amlodipine was increased. When seen May 2021 noted progressive exertional dyspnea. A soft systolic murmur was noted and echocardiogram ordered. He was started on Lasix 20mg  daily for pretibial edema.    Echo 09/23/19 with LVEF 60-65%, no RWMA, G1DD, RV normal size and function, moderate AV sclerosis without stenosis.  Reviewed in depth during clinic visit.   The patient had NSTEMI 09/2020 at which time cardiac cath showed LMCA with mild diffuse disease, LAD with moderate diffuse disease and chronic subtotal occlusion of the mid vessel. Distal LAD fills viz left to right collaterals. LCX with moderate mid vessel disease, RCA with CTO, filling L>R collaterals, SVG to RPDA,LAD, and OM with CTO, LIMA to LAD known to be atretic. LVEDP 12mmHg. Recommendations for medical therapy.    Seen 11/14/20 and denied angina. BP was soft. Recommended discontinuation of Imdur if EF was low on Echo. Echo showed LVEF 30-35%. Lasix was increased   Seen 12/21/20 and was doing better on increased dose of lasix. Imdur was stopped. Start Losartan if BP can tolerate.  Seen 10/10 and had hypotension. Repeat echo was ordered, which showed persistently low EF 30-35%, and he was sent to EP for ICD consideration.   HE saw EP 03/06/21 for ICD consideration and underwent  implantation 03/20/21.   Today, he reports some jaw pain. Also, some nights he wakes up feeling flushed with pain in the jaw. BP and HR up at this time. 140/80s, Hr80s. He takes a NTG and he feels OK. If he doesn't take a NTG he may have some chest tightness. This has been occurring for a few months. It occurs 3-4 times a month. He also exercises a couple times a week, 3-4x weekly). Sometimes after he starts he has the same symptoms. No shortness of breath, nasuea, vomiting. No LLE, orthopnea, pnd. BP at home 100/60.     Past Medical History:  Diagnosis Date   (HFpEF) heart failure with preserved ejection fraction ([place])    a. 10/2016 Echo: EF 65-70%, mild LVH, mildly dil LA. RV fxn nl.   Arthritis    BPH (benign prostatic hyperplasia)    Coronary artery disease    a. 2001 CABG x 3 [facility]): LIMA->LAD, VG->LCX, VG->RPDA; b. 04/05/2010 Cath ([place]): LM 61m, LAD 127m, LCX 46m, RCA 100p, LIMA->LAD atretic, VG->LCX 100, VG->RCA 80ost, 70d; c. 04/2010 Redo CABG x 2 ([place]): VG->LAD, VG->RPDA; d. 10/2012 Cath [facility]): LM 20-30, LAD 100/95-63m, LCX 40-16m, RCA 100p, 80-100d, RPDA 99, RPL1 95, VG->LAD ok, VG->RPDA ok, EF 67%; e. 11/2016 MV: EF 48%, No ischemia.   Diabetes mellitus without complication ([place])    Type II   Hyperlipidemia    Hypertension    Ischemic cardiomyopathy    a. 03/2021 s/p MDT Visia AF MRI VR SureScan single lead AICD (ser#  [id number]).   Scoliosis    Spinal stenosis     Past Surgical History:  Procedure Laterality Date   CARDIAC CATHETERIZATION     CORONARY ARTERY BYPASS GRAFT     x 2 (2001 and redo in late 2011 or early 2012)   HERNIA REPAIR Left    inguinal   ICD IMPLANT N/A 03/20/2021   Procedure: ICD IMPLANT;  Surgeon: Vickie Epley, MD;  Location: [place] [facility];  Service: Cardiovascular;  Laterality: N/A;   LEFT HEART CATH AND CORS/GRAFTS ANGIOGRAPHY N/A 10/04/2020   Procedure: LEFT HEART CATH AND CORS/GRAFTS ANGIOGRAPHY;  Surgeon: Leonie Man,  MD;  Location: [place] [facility];  Service: Cardiovascular;  Laterality: N/A;   LUMBAR LAMINECTOMY/DECOMPRESSION MICRODISCECTOMY N/A 10/20/2018   Procedure: L3-4, L4-5 LUMBAR DECOMPRESSION with dural repair.;  Surgeon: Marybelle Killings, MD;  Location: [place];  Service: Orthopedics;  Laterality: N/A;   TONSILLECTOMY AND ADENOIDECTOMY     TRANSURETHRAL RESECTION OF PROSTATE  1990    Current Medications: Current Meds  Medication Sig   acetaminophen (TYLENOL) 500 MG tablet Take 1,000 mg by mouth every 6 (six) hours as needed for moderate pain or headache.   aspirin EC 81 MG tablet Take 1 tablet (81 mg total) by mouth daily.   carvedilol (COREG) 12.5 MG tablet TAKE 1 TABLET (12.5 MG TOTAL) BY MOUTH 2 (TWO) TIMES DAILY WITH A MEAL.   Cholecalciferol (VITAMIN D-3 PO) Take 800 Units by mouth daily.   clopidogrel (PLAVIX) 75 MG tablet Take 1 tablet (75 mg total) by mouth daily. **NO PLAVIX UNTIL 12/20**   Coenzyme Q10 (CO Q-10) 200 MG CAPS Take 200 mg by mouth daily.    cyanocobalamin 500 MCG tablet Take 500 mcg by mouth daily.   diclofenac Sodium (VOLTAREN) 1 % GEL Apply 2 g topically 2 (two) times daily as needed.   ezetimibe (ZETIA) 10 MG tablet TAKE 1 TABLET BY MOUTH EVERY DAY   finasteride (PROSCAR) 5 MG tablet TAKE 1 TABLET BY MOUTH EVERY DAY   furosemide (LASIX) 40 MG tablet Take 40 mg by mouth 2 (two) times daily.   Glucosamine-Chondroit-Vit C-Mn (GLUCOSAMINE 1500 COMPLEX) CAPS Take 1 capsule by mouth daily.   Lancets (ONETOUCH ULTRASOFT) lancets Use to check blood sugars fasting daily   metFORMIN (GLUCOPHAGE) 500 MG tablet 1/2 tablet by mouth daily with food   Multiple Vitamins-Minerals (PRESERVISION AREDS 2 PO) Take 1 capsule by mouth 2 (two) times a day.   nitroGLYCERIN (NITROSTAT) 0.4 MG SL tablet PLACE 1 TABLET UNDER THE TONGUE EVERY 5 MINUTES AS NEEDED.   pantoprazole (PROTONIX) 40 MG tablet Take 1 tablet (40 mg total) by mouth daily.   ranolazine (RANEXA) 1000 MG SR tablet TAKE 1 TABLET  BY MOUTH TWICE A DAY   rosuvastatin (CRESTOR) 20 MG tablet TAKE 1 TABLET BY MOUTH EVERYDAY AT BEDTIME   [DISCONTINUED] isosorbide mononitrate (IMDUR) 30 MG 24 hr tablet TAKE 0.5 TABLETS (15 MG TOTAL) BY MOUTH AT BEDTIME.     Allergies:   Other and Tetracyclines & related   Social History   Socioeconomic History   Marital status: Married    Spouse name: Glenda   Number of children: 2   Years of education: Not on file   Highest education level: Not on file  Occupational History   Occupation: retired  Tobacco Use   Smoking status: Never   Smokeless tobacco: Never  Vaping Use   Vaping Use: Never used  Substance and Sexual Activity  Alcohol use: Not Currently    Alcohol/week: 11.0 standard drinks    Types: 5 Glasses of wine, 6 Cans of beer per week    Comment: 5-7 drinks a week (was 10-15 drinks/week)   Drug use: No   Sexual activity: Not Currently    Birth control/protection: None  Other Topics Concern   Not on file  Social History Narrative   Not on file   Social Determinants of Health   Financial Resource Strain: Low Risk    Difficulty of Paying Living Expenses: Not very hard  Food Insecurity: No Food Insecurity   Worried About Charity fundraiser in the Last Year: Never true   Ran Out of Food in the Last Year: Never true  Transportation Needs: No Transportation Needs   Lack of Transportation (Medical): No   Lack of Transportation (Non-Medical): No  Physical Activity: Insufficiently Active   Days of Exercise per Week: 3 days   Minutes of Exercise per Session: 20 min  Stress: No Stress Concern Present   Feeling of Stress : Not at all  Social Connections: Moderately Integrated   Frequency of Communication with Friends and Family: More than three times a week   Frequency of Social Gatherings with Friends and Family: Twice a week   Attends Religious Services: Never   Marine scientist or Organizations: Yes   Attends Music therapist: More than 4  times per year   Marital Status: Married     Family History: The patient's family history includes Alcohol abuse in his maternal uncle; Cancer in his father and paternal uncle; Diabetes in his maternal uncle and mother; Hyperlipidemia in his maternal uncle and mother.  ROS:   Please see the history of present illness.     All other systems reviewed and are negative.  EKGs/Labs/Other Studies Reviewed:    The following studies were reviewed today:  Echo 11/27/20 1. Left ventricular ejection fraction, by estimation, is 30 to 35%. The  left ventricle has moderately decreased function. The left ventricle  demonstrates regional wall motion abnormalities (see scoring  diagram/findings for description). There is mild  left ventricular hypertrophy. Left ventricular diastolic parameters are  consistent with Grade III diastolic dysfunction (restrictive). Elevated  left atrial pressure.   2. Right ventricular systolic function is mildly reduced. The right  ventricular size is normal. Tricuspid regurgitation signal is inadequate  for assessing PA pressure.   3. Left atrial size was mildly dilated.   4. The mitral valve is degenerative. Mild to moderate mitral valve  regurgitation. No evidence of mitral stenosis.   5. The aortic valve is tricuspid. There is mild calcification of the  aortic valve. There is mild thickening of the aortic valve. Aortic valve  regurgitation is not visualized. Mild aortic valve stenosis. Aortic valve  area, by VTI measures 1.97 cm.  Aortic valve mean gradient measures 9.0 mmHg.   6. The inferior vena cava is normal in size with greater than 50%  respiratory variability, suggesting right atrial pressure of 3 mmHg.   Cardiac cath 10/04/20 -----SEVERE ~ 3 VESSEL NATIVE CAD------ Mid LAD to Dist LAD lesion is 99% stenosed. Mid Cx lesion is 40% stenosed with 40% stenosed side branch in LPAV. Prox RCA to Dist RCA lesion is 100% stenosed. ---- GRAFTS----- SVG-RPDA  graft was visualized by angiography. Prox Graft to Insertion lesion is 100% stenosed. SVG-LAD graft was visualized by angiography. Origin to Prox Graft lesion is 100% stenosed. (flush occluded) Unable to [facility] old  Grafts (original SVG-rPDA & SVG-LCx). 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 ------ LV end diastolic pressure is moderately elevated. There is no aortic valve stenosis.   SUMMARY Severe native CAD: Long left main the trifurcates into LAD, LCx and RI. The LAD is subtotally occluded after major septal perforator (that provides collaterals to the PDA).  After the septal perforator the vessel essentially occluded yet still gives rise to a diagonal branch and 2 more septal perforators.  There are bridging collaterals filling the distal vessel with faint late filling.  The diagonal branch is small in caliber, but provides collaterals to the distal ramus as well as bridging collaterals to the LAD.  (The 2 small septal perforators do provide collaterals to the PDA) Moderate caliber RI is occluded proximally LCx is roughly 40% mid stenosis at bifurcation into major lateral OM and AV groove branch.  Both branches provide collaterals to the RPL system. RCA 100% proximal occluded.   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 There are ring markers indicating likely the new grafts:  New SVG-RPDA 100% proximal occlusion.  There is roughly 20 mm of dye hang up indicating relatively recent occlusion-likely culprit lesion.  Not likely to be salvaged.  Given the extent of collateral flow to the distal RCA system, likely competitive flow would keep the graft closed. Subacute to chronic flush occlusion of the new SVG-LAD.   Moderate to Severely Elevated LVEDP c/w Diastolic CHF (with ongoing CP & SOB - Acute on Chronic)         Glenetta Hew, MD     Antiplatelet/Anticoag Recommend Aspirin 81mg  daily for moderate CAD.  At a minimum aspirin, but would also be reasonable to use Plavix alone prophylactically.  Discharge Date Anticipated discharge date to be determined. Initial plan to discharge canceled.  Patient had significant dyspnea with rales and hypoxia upon arrival to the Short Stay Unit.  This was potential anticipated and post-cath orders with Lasix 40 mg ordered and administered.  Patient was placed on oxygen and symptomatically improved after diuresis.  Will admit as outpatient extended recovery for overnight monitoring.  This will also allow [place] to potentially titrate antianginal medications while he is inpatient. Unlikely that he would be candidate for a third CABG.      Coronary Diagrams   Diagnostic Dominance: Right      EKG:  EKG is  ordered today.  The ekg ordered today demonstrates SB, first degee AV block, 57bpm, TWI aVL, anterolateral infarct  Recent Labs: 08/31/2020: TSH 2.180 10/25/2020: BNP 599.9 01/15/2021: ALT 19 03/06/2021: BUN 20; Creatinine, Ser 0.85; Hemoglobin 14.6; Platelets 256; Potassium 4.6; Sodium 139  Recent Lipid Panel    Component Value Date/Time   CHOL 140 08/31/2020 0848   TRIG 156 (H) 08/31/2020 0848   HDL 50 08/31/2020 0848   CHOLHDL 2.8 08/31/2020 0848   CHOLHDL 2.7 11/14/2016 0911   VLDL 27 11/14/2016 0911   LDLCALC 63 08/31/2020 0848      Physical Exam:    VS:  BP 110/60 (BP Location: Left Arm, Patient Position: Sitting, Cuff Size: Normal)    Pulse (!) 57    Ht 5\' 6"  (1.676 m)    Wt 164 lb 2 oz (74.4 kg)    SpO2 99%    BMI 26.49 kg/m     Wt Readings from Last 3 Encounters:  04/26/21 164 lb 2 oz (74.4 kg)  04/10/21 162 lb (73.5  kg)  03/20/21 162 lb (73.5 kg)     GEN:  Well nourished, well developed in no acute distress HEENT: Normal NECK: No JVD; No carotid bruits LYMPHATICS: No lymphadenopathy CARDIAC: RRR, no murmurs, rubs, gallops RESPIRATORY:  Clear to auscultation without rales, wheezing or rhonchi  ABDOMEN: Soft, non-tender,  non-distended MUSCULOSKELETAL:  No edema; No deformity  SKIN: Warm and dry NEUROLOGIC:  Alert and oriented x 3 PSYCHIATRIC:  Normal affect   ASSESSMENT:    1. Ischemic cardiomyopathy   2. Chronic systolic heart failure ([place])   3. ICD (implantable cardioverter-defibrillator) in place   4. Mixed hyperlipidemia   5. Coronary artery disease of native artery of native heart with stable angina pectoris ([place])    PLAN:    In order of problems listed above:  HFrEF ICM s/p ICD Patient is euvolemic on exam. He will continue to follow with EP for ICD. Continue Coreg, Imdur, and current lasix dose. BP limiting GDMT.  CAD with stable angina Reports worsening anginal episodes. He is taking NTG more frequently, about 15 a month. Says it is not greatly affecting his quality of life. Last cath 09/2020 (see report above) recommended medical management. He is on Aspirin, Plavix, Ranexa 1000 BID, Imdur 15mg  daily, Coreg 12.5mg  BID. EKG with SR and old TWI aVL. H/o of low blood pressures limiting titration of medications. BP today 110/66. Recommend he take an extra 15mg  Imdur daily instead of frequent SL NTG. He will monitor BP at home. Discussed stress test, but will wait at this time, may consider at follow-up if symptoms have not changed. We will also try Protonix 40mg  daily to see if this helps with symptoms.   HLD LDL 63 08/2020. Continue Crestor 20mg  daily and Zetia.    Disposition: Follow up in 1 month(s) with MD/APP     Signed, Virgie Chery Ninfa Meeker, PA-C  04/26/2021 11:37 AM    [facility]

## 2021-04-26 ENCOUNTER — Ambulatory Visit: Payer: Medicare Other | Admitting: Medical

## 2021-04-26 ENCOUNTER — Other Ambulatory Visit: Payer: Self-pay

## 2021-04-26 ENCOUNTER — Encounter: Payer: Self-pay | Admitting: Medical

## 2021-04-26 VITALS — BP 110/60 | HR 57 | Ht 66.0 in | Wt 164.1 lb

## 2021-04-26 DIAGNOSIS — I25118 Atherosclerotic heart disease of native coronary artery with other forms of angina pectoris: Secondary | ICD-10-CM | POA: Diagnosis not present

## 2021-04-26 DIAGNOSIS — I5022 Chronic systolic (congestive) heart failure: Secondary | ICD-10-CM | POA: Diagnosis not present

## 2021-04-26 DIAGNOSIS — Z95810 Presence of automatic (implantable) cardiac defibrillator: Secondary | ICD-10-CM

## 2021-04-26 DIAGNOSIS — E782 Mixed hyperlipidemia: Secondary | ICD-10-CM

## 2021-04-26 DIAGNOSIS — I255 Ischemic cardiomyopathy: Secondary | ICD-10-CM | POA: Diagnosis not present

## 2021-04-26 MED ORDER — PANTOPRAZOLE SODIUM 40 MG PO TBEC: 40.0000 mg | DELAYED_RELEASE_TABLET | Freq: Every day | ORAL | 11 refills | Status: DC

## 2021-04-26 MED ORDER — ISOSORBIDE MONONITRATE ER 30 MG PO TB24: 30.0000 mg | ORAL_TABLET | Freq: Every day | ORAL | 3 refills | Status: DC

## 2021-04-26 NOTE — Patient Instructions (Signed)
Medication Instructions:   Your physician has recommended you make the following change in your medication:   INCREASE isosorbide mononitrate (Imdur) to 15 mg twice daily  START taking pantoprazole (Protonix) 40 mg daily   *If you need a refill on your cardiac medications before your next appointment, please call your pharmacy*   Lab Work: None ordered  If you have labs (blood work) drawn today and your tests are completely normal, you will receive your results only by: [place] (if you have MyChart) OR A paper copy in the mail If you have any lab test that is abnormal or we need to change your treatment, we will call you to review the results.   Testing/Procedures: None ordered   Follow-Up: At [facility], you and your health needs are our priority.  As part of our continuing mission to provide you with exceptional heart care, we have created designated Provider Care Teams.  These Care Teams include your primary Cardiologist (physician) and Advanced Practice Providers (APPs -  Physician Assistants and Nurse Practitioners) who all work together to provide you with the care you need, when you need it.  We recommend signing up for the patient portal called "MyChart".  Sign up information is provided on this After Visit Summary.  MyChart is used to connect with patients for Virtual Visits (Telemedicine).  Patients are able to view lab/test results, encounter notes, upcoming appointments, etc.  Non-urgent messages can be sent to your provider as well.   To learn more about what you can do with MyChart, go to [email or web address].    Your next appointment:   1 month(s)  The format for your next appointment:   In Person  Provider:   You may see Nelva Bush, MD or one of the following Advanced Practice Providers on your designated Care Team:   Murray Hodgkins, NP Christell Faith, PA-C Cadence Kathlen Mody, PA-C  :1}    Other Instructions N/A

## 2021-04-27 LAB — COLOGUARD: COLOGUARD: NEGATIVE

## 2021-04-29 MED ORDER — ISOSORBIDE MONONITRATE ER 30 MG PO TB24: 15.0000 mg | ORAL_TABLET | Freq: Two times a day (BID) | ORAL | 3 refills | Status: DC

## 2021-05-01 DIAGNOSIS — R6889 Other general symptoms and signs: Secondary | ICD-10-CM | POA: Diagnosis not present

## 2021-05-07 ENCOUNTER — Other Ambulatory Visit: Payer: Self-pay | Admitting: Internal Medicine

## 2021-05-07 NOTE — Telephone Encounter (Signed)
Pt last seen in office by Cadence Furth, PA-C 04/26/21.  Pt taking Lasix 40 mg BID at ov.  Per note:  HFrEF ICM s/p ICD Patient is euvolemic on exam. He will continue to follow with EP for ICD. Continue Coreg, Imdur, and current lasix dose. BP limiting GDMT.  Pt also has f/u 05/28/21.  Refills approved for Lasix 40 mg BID.

## 2021-05-07 NOTE — Telephone Encounter (Signed)
Please advise if ok to refill Furosemide 40 mg bid. Last filled by Historical provider.

## 2021-05-23 DIAGNOSIS — H524 Presbyopia: Secondary | ICD-10-CM | POA: Diagnosis not present

## 2021-05-23 DIAGNOSIS — E119 Type 2 diabetes mellitus without complications: Secondary | ICD-10-CM | POA: Diagnosis not present

## 2021-05-23 DIAGNOSIS — H2513 Age-related nuclear cataract, bilateral: Secondary | ICD-10-CM | POA: Diagnosis not present

## 2021-05-23 LAB — HM DIABETES EYE EXAM

## 2021-05-27 ENCOUNTER — Encounter: Payer: Self-pay | Admitting: Physician Assistant

## 2021-05-28 ENCOUNTER — Encounter: Payer: Self-pay | Admitting: Medical

## 2021-05-28 ENCOUNTER — Ambulatory Visit: Payer: Medicare Other | Admitting: Medical

## 2021-05-28 ENCOUNTER — Other Ambulatory Visit: Payer: Self-pay

## 2021-05-28 VITALS — BP 110/64 | HR 68 | Ht 62.0 in | Wt 162.0 lb

## 2021-05-28 DIAGNOSIS — I25118 Atherosclerotic heart disease of native coronary artery with other forms of angina pectoris: Secondary | ICD-10-CM

## 2021-05-28 DIAGNOSIS — I255 Ischemic cardiomyopathy: Secondary | ICD-10-CM

## 2021-05-28 DIAGNOSIS — E782 Mixed hyperlipidemia: Secondary | ICD-10-CM

## 2021-05-28 DIAGNOSIS — I5022 Chronic systolic (congestive) heart failure: Secondary | ICD-10-CM

## 2021-05-28 MED ORDER — ISOSORBIDE MONONITRATE ER 30 MG PO TB24: ORAL_TABLET | ORAL | 3 refills | Status: DC

## 2021-05-28 NOTE — Progress Notes (Signed)
Cardiology Office Note:    Date:  05/28/2021   ID:  Jacob Harrison, DOB 07-30-1940, MRN [id number]  PCP:  [name], PA-C  [facility] Cardiologist:  Nelva Bush, MD  [facility] Electrophysiologist:  Vickie Epley, MD   Referring MD: [name], PA-C   Chief Complaint: 3 month follow-up  History of Present Illness:    Jacob Harrison is a 81 y.o. male with a hx of with a hx of with a hx of CAD s/p CABG and redo CABG, DM2, HFpEF, HLD, chronic leg pain due to spinal stenosis, ICM, HFrEF who presents for follow-up.    Seen in November 2020 with bradycardia recommended to cut back on Carvedilol. Due to suboptimal BP control, Amlodipine was increased. When seen May 2021 noted progressive exertional dyspnea. A soft systolic murmur was noted and echocardiogram ordered. He was started on Lasix 20mg  daily for pretibial edema.    Echo 09/23/19 with LVEF 60-65%, no RWMA, G1DD, RV normal size and function, moderate AV sclerosis without stenosis.  Reviewed in depth during clinic visit.   The patient had NSTEMI 09/2020 at which time cardiac cath showed LMCA with mild diffuse disease, LAD with moderate diffuse disease and chronic subtotal occlusion of the mid vessel. Distal LAD fills viz left to right collaterals. LCX with moderate mid vessel disease, RCA with CTO, filling L>R collaterals, SVG to RPDA,LAD, and OM with CTO, LIMA to LAD known to be atretic. LVEDP 71mmHg. Recommendations for medical therapy.    Seen 11/14/20 and denied angina. BP was soft. Recommended discontinuation of Imdur if EF was low on Echo. Echo showed LVEF 30-35%. Lasix was increased   Seen 12/21/20 and was doing better on increased dose of lasix. Imdur was stopped. Start Losartan if BP can tolerate.   Seen 10/10 and had hypotension. Repeat echo was ordered, which showed persistently low EF 30-35%, and he was sent to EP for ICD consideration.    HE saw EP 03/06/21 for ICD consideration and  underwent implantation 03/20/21.   Last seen 04/26/21 and reported jaw pain. BP was soft, recommended he take Imdur 15mg  BID.   Today, he reports chest pain is better with Imdur twice daily. He has need NTG 6 times since the last visit. Same chest pain as before. BP has been good at home. Has been exercising 3 tiems a week. Has needed NTG 3 times in the last month during exercise. No shortness of breath, LLE, orthopnea, pnd.    Past Medical History:  Diagnosis Date   (HFpEF) heart failure with preserved ejection fraction ([place])    a. 10/2016 Echo: EF 65-70%, mild LVH, mildly dil LA. RV fxn nl.   Arthritis    BPH (benign prostatic hyperplasia)    Coronary artery disease    a. 2001 CABG x 3 [facility]): LIMA->LAD, VG->LCX, VG->RPDA; b. 04/05/2010 Cath ([place]): LM 97m, LAD 159m, LCX 9m, RCA 100p, LIMA->LAD atretic, VG->LCX 100, VG->RCA 80ost, 70d; c. 04/2010 Redo CABG x 2 ([place]): VG->LAD, VG->RPDA; d. 10/2012 Cath [facility]): LM 20-30, LAD 100/95-65m, LCX 40-76m, RCA 100p, 80-100d, RPDA 99, RPL1 95, VG->LAD ok, VG->RPDA ok, EF 67%; e. 11/2016 MV: EF 48%, No ischemia.   Diabetes mellitus without complication ([place])    Type II   Hyperlipidemia    Hypertension    Ischemic cardiomyopathy    a. 03/2021 s/p MDT Visia AF MRI VR SureScan single lead AICD (ser# [id number]).   Scoliosis    Spinal stenosis     Past Surgical  History:  Procedure Laterality Date   CARDIAC CATHETERIZATION     CORONARY ARTERY BYPASS GRAFT     x 2 (2001 and redo in late 2011 or early 2012)   HERNIA REPAIR Left    inguinal   ICD IMPLANT N/A 03/20/2021   Procedure: ICD IMPLANT;  Surgeon: Vickie Epley, MD;  Location: [place] [facility];  Service: Cardiovascular;  Laterality: N/A;   LEFT HEART CATH AND CORS/GRAFTS ANGIOGRAPHY N/A 10/04/2020   Procedure: LEFT HEART CATH AND CORS/GRAFTS ANGIOGRAPHY;  Surgeon: Leonie Man, MD;  Location: [place] [facility];  Service: Cardiovascular;  Laterality: N/A;   LUMBAR  LAMINECTOMY/DECOMPRESSION MICRODISCECTOMY N/A 10/20/2018   Procedure: L3-4, L4-5 LUMBAR DECOMPRESSION with dural repair.;  Surgeon: Marybelle Killings, MD;  Location: [place];  Service: Orthopedics;  Laterality: N/A;   TONSILLECTOMY AND ADENOIDECTOMY     TRANSURETHRAL RESECTION OF PROSTATE  1990    Current Medications: Current Meds  Medication Sig   acetaminophen (TYLENOL) 500 MG tablet Take 1,000 mg by mouth every 6 (six) hours as needed for moderate pain or headache.   aspirin EC 81 MG tablet Take 1 tablet (81 mg total) by mouth daily.   carvedilol (COREG) 12.5 MG tablet TAKE 1 TABLET (12.5 MG TOTAL) BY MOUTH 2 (TWO) TIMES DAILY WITH A MEAL.   Cholecalciferol (VITAMIN D-3 PO) Take 800 Units by mouth daily.   clopidogrel (PLAVIX) 75 MG tablet Take 1 tablet (75 mg total) by mouth daily. **NO PLAVIX UNTIL 12/20**   Coenzyme Q10 (CO Q-10) 200 MG CAPS Take 200 mg by mouth daily.    cyanocobalamin 500 MCG tablet Take 500 mcg by mouth daily.   diclofenac Sodium (VOLTAREN) 1 % GEL Apply 2 g topically 2 (two) times daily as needed.   ezetimibe (ZETIA) 10 MG tablet TAKE 1 TABLET BY MOUTH EVERY DAY   finasteride (PROSCAR) 5 MG tablet TAKE 1 TABLET BY MOUTH EVERY DAY   furosemide (LASIX) 40 MG tablet TAKE 1 TABLET BY MOUTH TWICE A DAY   Glucosamine-Chondroit-Vit C-Mn (GLUCOSAMINE 1500 COMPLEX) CAPS Take 1 capsule by mouth daily.   Lancets (ONETOUCH ULTRASOFT) lancets Use to check blood sugars fasting daily   metFORMIN (GLUCOPHAGE) 500 MG tablet 1/2 tablet by mouth daily with food   Multiple Vitamins-Minerals (PRESERVISION AREDS 2 PO) Take 1 capsule by mouth 2 (two) times a day.   nitroGLYCERIN (NITROSTAT) 0.4 MG SL tablet PLACE 1 TABLET UNDER THE TONGUE EVERY 5 MINUTES AS NEEDED.   ranolazine (RANEXA) 1000 MG SR tablet TAKE 1 TABLET BY MOUTH TWICE A DAY   rosuvastatin (CRESTOR) 20 MG tablet TAKE 1 TABLET BY MOUTH EVERYDAY AT BEDTIME   [DISCONTINUED] isosorbide mononitrate (IMDUR) 30 MG 24 hr tablet Take 0.5  tablets (15 mg total) by mouth in the morning and at bedtime.     Allergies:   Other and Tetracyclines & related   Social History   Socioeconomic History   Marital status: Married    Spouse name: Glenda   Number of children: 2   Years of education: Not on file   Highest education level: Not on file  Occupational History   Occupation: retired  Tobacco Use   Smoking status: Never   Smokeless tobacco: Never  Vaping Use   Vaping Use: Never used  Substance and Sexual Activity   Alcohol use: Not Currently    Alcohol/week: 11.0 standard drinks    Types: 5 Glasses of wine, 6 Cans of beer per week  Comment: 5-7 drinks a week (was 10-15 drinks/week)   Drug use: No   Sexual activity: Not Currently    Birth control/protection: None  Other Topics Concern   Not on file  Social History Narrative   Not on file   Social Determinants of Health   Financial Resource Strain: Low Risk    Difficulty of Paying Living Expenses: Not very hard  Food Insecurity: No Food Insecurity   Worried About Running Out of Food in the Last Year: Never true   Ran Out of Food in the Last Year: Never true  Transportation Needs: No Transportation Needs   Lack of Transportation (Medical): No   Lack of Transportation (Non-Medical): No  Physical Activity: Insufficiently Active   Days of Exercise per Week: 3 days   Minutes of Exercise per Session: 20 min  Stress: No Stress Concern Present   Feeling of Stress : Not at all  Social Connections: Moderately Integrated   Frequency of Communication with Friends and Family: More than three times a week   Frequency of Social Gatherings with Friends and Family: Twice a week   Attends Religious Services: Never   Marine scientist or Organizations: Yes   Attends Music therapist: More than 4 times per year   Marital Status: Married     Family History: The patient's family history includes Alcohol abuse in his maternal uncle; Cancer in his father  and paternal uncle; Diabetes in his maternal uncle and mother; Hyperlipidemia in his maternal uncle and mother; Skin cancer in his brother.  ROS:   Please see the history of present illness.     All other systems reviewed and are negative.  EKGs/Labs/Other Studies Reviewed:    The following studies were reviewed today: Echo 11/27/20 1. Left ventricular ejection fraction, by estimation, is 30 to 35%. The  left ventricle has moderately decreased function. The left ventricle  demonstrates regional wall motion abnormalities (see scoring  diagram/findings for description). There is mild  left ventricular hypertrophy. Left ventricular diastolic parameters are  consistent with Grade III diastolic dysfunction (restrictive). Elevated  left atrial pressure.   2. Right ventricular systolic function is mildly reduced. The right  ventricular size is normal. Tricuspid regurgitation signal is inadequate  for assessing PA pressure.   3. Left atrial size was mildly dilated.   4. The mitral valve is degenerative. Mild to moderate mitral valve  regurgitation. No evidence of mitral stenosis.   5. The aortic valve is tricuspid. There is mild calcification of the  aortic valve. There is mild thickening of the aortic valve. Aortic valve  regurgitation is not visualized. Mild aortic valve stenosis. Aortic valve  area, by VTI measures 1.97 cm.  Aortic valve mean gradient measures 9.0 mmHg.   6. The inferior vena cava is normal in size with greater than 50%  respiratory variability, suggesting right atrial pressure of 3 mmHg.   Cardiac cath 10/04/20 -----SEVERE ~ 3 VESSEL NATIVE CAD------ Mid LAD to Dist LAD lesion is 99% stenosed. Mid Cx lesion is 40% stenosed with 40% stenosed side branch in LPAV. Prox RCA to Dist RCA lesion is 100% stenosed. ---- GRAFTS----- SVG-RPDA graft was visualized by angiography. Prox Graft to Insertion lesion is 100% stenosed. SVG-LAD graft was visualized by angiography. Origin  to Prox Graft lesion is 100% stenosed. (flush occluded) Unable to Visualize old Grafts (original SVG-rPDA & SVG-LCx). 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 ------ LV end diastolic pressure is moderately elevated. There is no  aortic valve stenosis.   SUMMARY Severe native CAD: Long left main the trifurcates into LAD, LCx and RI. The LAD is subtotally occluded after major septal perforator (that provides collaterals to the PDA).  After the septal perforator the vessel essentially occluded yet still gives rise to a diagonal branch and 2 more septal perforators.  There are bridging collaterals filling the distal vessel with faint late filling.  The diagonal branch is small in caliber, but provides collaterals to the distal ramus as well as bridging collaterals to the LAD.  (The 2 small septal perforators do provide collaterals to the PDA) Moderate caliber RI is occluded proximally LCx is roughly 40% mid stenosis at bifurcation into major lateral OM and AV groove branch.  Both branches provide collaterals to the RPL system. RCA 100% proximal occluded.   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 There are ring markers indicating likely the new grafts:  New SVG-RPDA 100% proximal occlusion.  There is roughly 20 mm of dye hang up indicating relatively recent occlusion-likely culprit lesion.  Not likely to be salvaged.  Given the extent of collateral flow to the distal RCA system, likely competitive flow would keep the graft closed. Subacute to chronic flush occlusion of the new SVG-LAD.   Moderate to Severely Elevated LVEDP c/w Diastolic CHF (with ongoing CP & SOB - Acute on Chronic)         Glenetta Hew, MD     Antiplatelet/Anticoag Recommend Aspirin 81mg  daily for moderate CAD. At a minimum aspirin, but would also be reasonable to use Plavix alone prophylactically.  Discharge Date Anticipated discharge date to be  determined. Initial plan to discharge canceled.  Patient had significant dyspnea with rales and hypoxia upon arrival to the Short Stay Unit.  This was potential anticipated and post-cath orders with Lasix 40 mg ordered and administered.  Patient was placed on oxygen and symptomatically improved after diuresis.  Will admit as outpatient extended recovery for overnight monitoring.  This will also allow [place] to potentially titrate antianginal medications while he is inpatient. Unlikely that he would be candidate for a third CABG.      Coronary Diagrams   Diagnostic Dominance: Right         EKG:  EKG is not ordered today.    Recent Labs: 08/31/2020: TSH 2.180 10/25/2020: BNP 599.9 01/15/2021: ALT 19 03/06/2021: BUN 20; Creatinine, Ser 0.85; Hemoglobin 14.6; Platelets 256; Potassium 4.6; Sodium 139  Recent Lipid Panel    Component Value Date/Time   CHOL 140 08/31/2020 0848   TRIG 156 (H) 08/31/2020 0848   HDL 50 08/31/2020 0848   CHOLHDL 2.8 08/31/2020 0848   CHOLHDL 2.7 11/14/2016 0911   VLDL 27 11/14/2016 0911   LDLCALC 63 08/31/2020 0848    Physical Exam:    VS:  BP 110/64 (BP Location: Left Arm, Patient Position: Sitting, Cuff Size: Normal)    Pulse 68    Ht 5\' 2"  (1.575 m)    Wt 162 lb (73.5 kg)    SpO2 98%    BMI 29.63 kg/m     Wt Readings from Last 3 Encounters:  05/28/21 162 lb (73.5 kg)  04/26/21 164 lb 2 oz (74.4 kg)  04/10/21 162 lb (73.5 kg)     GEN:  Well nourished, well developed in no acute distress HEENT: Normal NECK: No JVD; No carotid bruits LYMPHATICS: No lymphadenopathy CARDIAC: RRR, no murmurs, rubs, gallops RESPIRATORY:  Clear to auscultation without rales, wheezing or rhonchi  ABDOMEN: Soft, non-tender, non-distended MUSCULOSKELETAL:  No edema; No deformity  SKIN: Warm and dry NEUROLOGIC:  Alert and oriented x 3 PSYCHIATRIC:  Normal affect   ASSESSMENT:    1. Coronary artery disease of native artery of native heart with stable angina  pectoris ([place])   2. Ischemic cardiomyopathy   3. Chronic systolic heart failure ([place])   4. Hyperlipidemia, mixed    PLAN:    In order of problems listed above:  CAD with stable angina Patient reports persistent stable angina. Increasing Imdur 15mg  to twice daily has improved chest pain and he has needed less NTG. BP has been stable on Imdur. Increase Imdur to 30mg  in the am and 15mg  in the pm. He has severe CAD with prior CABGx2. Last cath 09/2020, report above, no PCI targets. Will discuss with MD repeat cath. Continue Aspirin, Plavix, Coreg, Zetia, Ranexa, Crestor.   HFrEF ICM s/p ICD Patient is euvolemic on exam. He will continue to follow with EP for ICD. Continue Coreg, Imdur, and lasix. BP limiting GDMT.   HLD LDL 63 08/2020. Continue Crestor and Zetia.  Disposition: Follow up in 2-3 month(s) with MD/APP     Signed, Amena Dockham Ninfa Meeker, PA-C  05/28/2021 3:18 PM    [facility]

## 2021-05-28 NOTE — Patient Instructions (Signed)
Medication Instructions:  Your physician has recommended you make the following change in your medication:   INCREASE your morning dose of your isosorbide mononitrate (Imdur) to 30 mg.   CONTINUE to take 15 mg of Imdur in the evening  *If you need a refill on your cardiac medications before your next appointment, please call your pharmacy*   Lab Work: None ordered  If you have labs (blood work) drawn today and your tests are completely normal, you will receive your results only by: [place] (if you have MyChart) OR A paper copy in the mail If you have any lab test that is abnormal or we need to change your treatment, we will call you to review the results.   Testing/Procedures: None ordered   Follow-Up: At [facility], you and your health needs are our priority.  As part of our continuing mission to provide you with exceptional heart care, we have created designated Provider Care Teams.  These Care Teams include your primary Cardiologist (physician) and Advanced Practice Providers (APPs -  Physician Assistants and Nurse Practitioners) who all work together to provide you with the care you need, when you need it.  We recommend signing up for the patient portal called "MyChart".  Sign up information is provided on this After Visit Summary.  MyChart is used to connect with patients for Virtual Visits (Telemedicine).  Patients are able to view lab/test results, encounter notes, upcoming appointments, etc.  Non-urgent messages can be sent to your provider as well.   To learn more about what you can do with MyChart, go to [email or web address].    Your next appointment:   2-3 month(s)  The format for your next appointment:   In Person  Provider:   You may see Nelva Bush, MD or one of the following Advanced Practice Providers on your designated Care Team:   Murray Hodgkins, NP Christell Faith, PA-C Cadence Kathlen Mody, [place]   Other Instructions N/A

## 2021-06-13 ENCOUNTER — Other Ambulatory Visit: Payer: Self-pay | Admitting: Nurse Practitioner

## 2021-06-13 ENCOUNTER — Other Ambulatory Visit: Payer: Self-pay | Admitting: Physician Assistant

## 2021-06-13 DIAGNOSIS — N401 Enlarged prostate with lower urinary tract symptoms: Secondary | ICD-10-CM

## 2021-06-19 ENCOUNTER — Encounter: Payer: Self-pay | Admitting: Cardiology

## 2021-06-19 ENCOUNTER — Ambulatory Visit (INDEPENDENT_AMBULATORY_CARE_PROVIDER_SITE_OTHER): Payer: Medicare Other

## 2021-06-19 ENCOUNTER — Ambulatory Visit: Payer: Medicare Other | Admitting: Cardiology

## 2021-06-19 ENCOUNTER — Other Ambulatory Visit: Payer: Self-pay

## 2021-06-19 VITALS — BP 108/70 | HR 69 | Ht 66.0 in | Wt 164.0 lb

## 2021-06-19 DIAGNOSIS — Z95810 Presence of automatic (implantable) cardiac defibrillator: Secondary | ICD-10-CM

## 2021-06-19 DIAGNOSIS — I255 Ischemic cardiomyopathy: Secondary | ICD-10-CM

## 2021-06-19 DIAGNOSIS — I25118 Atherosclerotic heart disease of native coronary artery with other forms of angina pectoris: Secondary | ICD-10-CM | POA: Diagnosis not present

## 2021-06-19 DIAGNOSIS — I5022 Chronic systolic (congestive) heart failure: Secondary | ICD-10-CM | POA: Diagnosis not present

## 2021-06-19 LAB — CUP PACEART REMOTE DEVICE CHECK
Battery Remaining Longevity: 131 mo
Battery Voltage: 3.07 V
Brady Statistic RV Percent Paced: 0.19 %
Date Time Interrogation Session: 20230315033523
HighPow Impedance: 59 Ohm
Implantable Lead Implant Date: 20221214
Implantable Lead Location: 753860
Implantable Pulse Generator Implant Date: 20221214
Lead Channel Impedance Value: 323 Ohm
Lead Channel Impedance Value: 380 Ohm
Lead Channel Pacing Threshold Amplitude: 0.875 V
Lead Channel Pacing Threshold Pulse Width: 0.4 ms
Lead Channel Sensing Intrinsic Amplitude: 12.75 mV
Lead Channel Sensing Intrinsic Amplitude: 12.75 mV
Lead Channel Setting Pacing Amplitude: 1.5 V
Lead Channel Setting Pacing Pulse Width: 0.4 ms
Lead Channel Setting Sensing Sensitivity: 0.3 mV

## 2021-06-19 NOTE — Progress Notes (Signed)
?Electrophysiology Office Follow up Visit Note:   ? ?Date:  06/19/2021  ? ?ID:  Jacob Harrison, DOB Apr 14, 1940, MRN [id number] ? ?PCP:  Jacob Reid, PA-C  ?[facility] Cardiologist:  Jacob Bush, MD  ?[facility] Electrophysiologist:  Jacob Epley, MD  ? ? ?Interval History:   ? ?Jacob Harrison is a 81 y.o. male who presents for a follow up visit.  He had an ICD implant March 20, 2021.  Remote interrogations of the device have shown stable lead function. ? ? ? ?  ? ?Past Medical History:  ?Diagnosis Date  ? (HFpEF) heart failure with preserved ejection fraction ([place])   ? a. 10/2016 Echo: EF 65-70%, mild LVH, mildly dil LA. RV fxn nl.  ? Arthritis   ? BPH (benign prostatic hyperplasia)   ? Coronary artery disease   ? a. 2001 CABG x 3 [facility]): LIMA->LAD, VG->LCX, VG->RPDA; b. 04/05/2010 Cath ([facility]): LM 36m LAD 1032mLCX 4077mCA 100p, LIMA->LAD atretic, VG->LCX 100, VG->RCA 80ost, 70d; c. 04/2010 Redo CABG x 2 ([place]): VG->LAD, VG->RPDA; d. 10/2012 Cath ([facility] 20-30, LAD 100/95-36m26mX 40-25m,74m 100p, 80-100d, RPDA 99, RPL1 95, VG->LAD ok, VG->RPDA ok, EF 67%; e. 11/2016 MV: EF 48%, No ischemia.  ? 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 Hyperlipidemia   ? Hypertension   ? Ischemic cardiomyopathy   ? a. 03/2021 s/p MDT Visia AF MRI VR SureScan single lead AICD (ser# [id number] ? Scoliosis   ? Spinal stenosis   ? ? ?Past Surgical History:  ?Procedure Laterality Date  ? CARDIAC CATHETERIZATION    ? CORONARY ARTERY BYPASS GRAFT    ? x 2 (2001 and redo in late 2011 or early 2012)  ? HERNIA REPAIR Left   ? inguinal  ? ICD IMPLANT N/A 03/20/2021  ? Procedure: ICD IMPLANT;  Surgeon: Jacob Harrison  [facility];  Service: Cardiovascular;  Laterality: N/A;  ? LEFT HEART CATH AND CORS/GRAFTS ANGIOGRAPHY N/A 10/04/2020  ? Procedure: LEFT HEART CATH AND CORS/GRAFTS ANGIOGRAPHY;  Surgeon: Jacob Harrison  [facility];  Service: Cardiovascular;  Laterality: N/A;  ? LUMBAR LAMINECTOMY/DECOMPRESSION MICRODISCECTOMY N/A 10/20/2018  ? Procedure: L3-4, L4-5 LUMBAR DECOMPRESSION with dural repair.;  Surgeon: Jacob Harrison  [facility] [place];  Laterality: N/A;  ? TONSILLECTOMY AND ADENOIDECTOMY    ? TRANSURETHRAL RESECTION OF PROSTATE  1990  ? ? ?Current Medications: ?Current Meds  ?Medication Sig  ? acetaminophen (TYLENOL) 500 MG tablet Take 1,000 mg by mouth every 6 (six) hours as needed for moderate pain or headache.  ? aspirin EC 81 MG tablet Take 1 tablet (81 mg total) by mouth daily.  ? carvedilol (COREG) 12.5 MG tablet TAKE 1 TABLET (12.5 MG TOTAL) BY MOUTH 2 (TWO) TIMES DAILY WITH A MEAL.  ? Cholecalciferol (VITAMIN D-3 PO) Take 800 Units by mouth daily.  ? clopidogrel (PLAVIX) 75 MG tablet Take 1 tablet (75 mg total) by mouth daily.  ? Coenzyme Q10 (CO Q-10) 200 MG CAPS Take 200 mg by mouth daily.   ? cyanocobalamin 500 MCG tablet Take 500 mcg by mouth daily.  ? diclofenac Sodium (VOLTAREN) 1 % GEL Apply 2 g topically 2 (two) times daily as needed.  ? ezetimibe (ZETIA) 10 MG tablet TAKE 1 TABLET BY MOUTH EVERY DAY  ? finasteride (PROSCAR) 5 MG tablet TAKE 1 TABLET BY MOUTH EVERY DAY  ? furosemide (  LASIX) 40 MG tablet TAKE 1 TABLET BY MOUTH TWICE A DAY  ? Glucosamine-Chondroit-Vit C-Mn (GLUCOSAMINE 1500 COMPLEX) CAPS Take 1 capsule by mouth daily.  ? isosorbide mononitrate (IMDUR) 30 MG 24 hr tablet Take 30 mg (1 tablet) in the morning and 15 mg (1/2 tablet) in the evening.  ? Lancets (ONETOUCH ULTRASOFT) lancets Use to check blood sugars fasting daily  ? metFORMIN (GLUCOPHAGE) 500 MG tablet 1/2 tablet by mouth daily with food  ? Multiple Vitamins-Minerals (PRESERVISION AREDS 2 PO) Take 1 capsule by mouth 2 (two) times a day.  ? nitroGLYCERIN (NITROSTAT) 0.4 MG SL tablet PLACE 1 TABLET UNDER THE TONGUE EVERY 5 MINUTES AS NEEDED.  ? ranolazine (RANEXA) 1000 MG SR tablet TAKE 1 TABLET BY MOUTH TWICE  A DAY  ? rosuvastatin (CRESTOR) 20 MG tablet TAKE 1 TABLET BY MOUTH EVERYDAY AT BEDTIME  ?  ? ?Allergies:   Other and Tetracyclines & related  ? ?Social History  ? ?Socioeconomic History  ? Marital status: Married  ?  Spouse name: Jacob Harrison  ? Number of children: 2  ? Years of education: Not on file  ? Highest education level: Not on file  ?Occupational History  ? Occupation: retired  ?Tobacco Use  ? Smoking status: Never  ? Smokeless tobacco: Never  ?Vaping Use  ? Vaping Use: Never used  ?Substance and Sexual Activity  ? Alcohol use: Not Currently  ?  Alcohol/week: 11.0 standard drinks  ?  Types: 5 Glasses of wine, 6 Cans of beer per week  ?  Comment: 5-7 drinks a week (was 10-15 drinks/week)  ? Drug use: No  ? Sexual activity: Not Currently  ?  Birth control/protection: None  ?Other Topics Concern  ? Not on file  ?Social History Narrative  ? Not on file  ? ?Social Determinants of Health  ? ?Financial Resource Strain: Low Risk   ? Difficulty of Paying Living Expenses: Not very hard  ?Food Insecurity: No Food Insecurity  ? Worried About Charity fundraiser in the Last Year: Never true  ? Ran Out of Food in the Last Year: Never true  ?Transportation Needs: No Transportation Needs  ? Lack of Transportation (Medical): No  ? Lack of Transportation (Non-Medical): No  ?Physical Activity: Insufficiently Active  ? Days of Exercise per Week: 3 days  ? Minutes of Exercise per Session: 20 min  ?Stress: No Stress Concern Present  ? Feeling of Stress : Not at all  ?Social Connections: Moderately Integrated  ? Frequency of Communication with Friends and Family: More than three times a week  ? Frequency of Social Gatherings with Friends and Family: Twice a week  ? Attends Religious Services: Never  ? Active Member of Clubs or Organizations: Yes  ? Attends Archivist Meetings: More than 4 times per year  ? Marital Status: Married  ?  ? ?Family History: ?The patient's family history includes Alcohol abuse in his maternal  uncle; Cancer in his father and paternal uncle; Diabetes in his maternal uncle and mother; Hyperlipidemia in his maternal uncle and mother; Skin cancer in his brother. ? ?ROS:   ?Please see the history of present illness.    ?All other systems reviewed and are negative. ? ?EKGs/Labs/Other Studies Reviewed:   ? ?The following studies were reviewed today: ? ?June 19, 2021 in clinic device interrogation personally reviewed shows stable lead parameters.  Battery longevity 11 years.  99.8% ventricular sensing.  No delivered high-voltage therapies. ? ?EKG:  The ekg ordered  today demonstrates sinus rhythm.  PVC. ? ?Recent Labs: ?08/31/2020: TSH 2.180 ?10/25/2020: BNP 599.9 ?01/15/2021: ALT 19 ?03/06/2021: BUN 20; Creatinine, Ser 0.85; Hemoglobin 14.6; Platelets 256; Potassium 4.6; Sodium 139  ?Recent Lipid Panel ?   ?Component Value Date/Time  ? CHOL 140 08/31/2020 0848  ? TRIG 156 (H) 08/31/2020 0848  ? HDL 50 08/31/2020 0848  ? CHOLHDL 2.8 08/31/2020 0848  ? CHOLHDL 2.7 11/14/2016 0911  ? VLDL 27 11/14/2016 0911  ? [place] 63 08/31/2020 0848  ? ? ?Physical Exam:   ? ?VS:  BP 108/70 (BP Location: Left Arm, Patient Position: Sitting, Cuff Size: Normal)   Pulse 69   Ht '5\' 6"'$  (1.676 m)   Wt 164 lb (74.4 kg)   SpO2 98%   BMI 26.47 kg/m?    ? ?Wt Readings from Last 3 Encounters:  ?06/19/21 164 lb (74.4 kg)  ?05/28/21 162 lb (73.5 kg)  ?04/26/21 164 lb 2 oz (74.4 kg)  ?  ? ?GEN:  Well nourished, well developed in no acute distress ?HEENT: Normal ?NECK: No JVD; No carotid bruits ?LYMPHATICS: No lymphadenopathy ?CARDIAC: RRR, no murmurs, rubs, gallops.  ICD pocket well-healed ?RESPIRATORY:  Clear to auscultation without rales, wheezing or rhonchi  ?ABDOMEN: Soft, non-tender, non-distended ?MUSCULOSKELETAL:  No edema; No deformity  ?SKIN: Warm and dry ?NEUROLOGIC:  Alert and oriented x 3 ?PSYCHIATRIC:  Normal affect  ? ? ? ?  ? ?ASSESSMENT:   ? ?1. Chronic systolic heart failure ([place])   ?2. Coronary artery disease of native  artery of native heart with stable angina pectoris ([place])   ?3. ICD (implantable cardioverter-defibrillator) in place   ? ?PLAN:   ? ?In order of problems listed above: ? ? ?#Chronic systolic heart failure ?NYHA

## 2021-06-19 NOTE — Patient Instructions (Signed)
Medication Instructions:  ?- Your physician recommends that you continue on your current medications as directed. Please refer to the Current Medication list given to you today. ? ?*If you need a refill on your cardiac medications before your next appointment, please call your pharmacy* ? ? ?Lab Work: ?- none ordered ? ?If you have labs (blood work) drawn today and your tests are completely normal, you will receive your results only by: ?MyChart Message (if you have MyChart) OR ?A paper copy in the mail ?If you have any lab test that is abnormal or we need to change your treatment, we will call you to review the results. ? ? ?Testing/Procedures: ?- none ordered ? ? ?Follow-Up: ?At [facility], you and your health needs are our priority.  As part of our continuing mission to provide you with exceptional heart care, we have created designated Provider Care Teams.  These Care Teams include your primary Cardiologist (physician) and Advanced Practice Providers (APPs -  Physician Assistants and Nurse Practitioners) who all work together to provide you with the care you need, when you need it. ? ?We recommend signing up for the patient portal called "MyChart".  Sign up information is provided on this After Visit Summary.  MyChart is used to connect with patients for Virtual Visits (Telemedicine).  Patients are able to view lab/test results, encounter notes, upcoming appointments, etc.  Non-urgent messages can be sent to your provider as well.   ?To learn more about what you can do with MyChart, go to [email or web address].   ? ?Your next appointment:   ?1 year(s) ? ?The format for your next appointment:   ?In Person ? ?Provider:   ?Lars Mage, MD  ? ? ?Other Instructions ?N/a ? ?

## 2021-06-20 ENCOUNTER — Other Ambulatory Visit: Payer: Self-pay | Admitting: Internal Medicine

## 2021-06-30 ENCOUNTER — Other Ambulatory Visit: Payer: Self-pay | Admitting: Internal Medicine

## 2021-06-30 DIAGNOSIS — I25118 Atherosclerotic heart disease of native coronary artery with other forms of angina pectoris: Secondary | ICD-10-CM

## 2021-07-01 NOTE — Progress Notes (Signed)
Remote ICD transmission.   

## 2021-07-05 ENCOUNTER — Other Ambulatory Visit: Payer: Self-pay | Admitting: Medical

## 2021-07-19 ENCOUNTER — Ambulatory Visit (HOSPITAL_COMMUNITY)
Admission: RE | Admit: 2021-07-19 | Discharge: 2021-07-19 | Disposition: A | Discharge status: Home / Self Care | Payer: Medicare Other | Source: Ambulatory Visit | Attending: Cardiovascular Disease | Admitting: Cardiovascular Disease

## 2021-07-19 DIAGNOSIS — I6521 Occlusion and stenosis of right carotid artery: Secondary | ICD-10-CM | POA: Diagnosis not present

## 2021-07-31 ENCOUNTER — Encounter: Payer: Self-pay | Admitting: Medical

## 2021-07-31 ENCOUNTER — Ambulatory Visit: Payer: Medicare Other | Admitting: Medical

## 2021-07-31 VITALS — BP 110/62 | HR 60 | Ht 66.0 in | Wt 162.0 lb

## 2021-07-31 DIAGNOSIS — I5022 Chronic systolic (congestive) heart failure: Secondary | ICD-10-CM | POA: Diagnosis not present

## 2021-07-31 DIAGNOSIS — E782 Mixed hyperlipidemia: Secondary | ICD-10-CM | POA: Diagnosis not present

## 2021-07-31 DIAGNOSIS — I25118 Atherosclerotic heart disease of native coronary artery with other forms of angina pectoris: Secondary | ICD-10-CM

## 2021-07-31 DIAGNOSIS — Z95810 Presence of automatic (implantable) cardiac defibrillator: Secondary | ICD-10-CM

## 2021-07-31 MED ORDER — LOSARTAN POTASSIUM 25 MG PO TABS: 25.0000 mg | ORAL_TABLET | Freq: Every day | ORAL | 3 refills | Status: DC

## 2021-07-31 NOTE — Progress Notes (Signed)
?Cardiology Office Note:   ? ?Date:  07/31/2021  ? ?ID:  Jacob Harrison, DOB 06/30/1940, MRN [id number] ? ?PCP:  [name], PA-C  ?[facility] Cardiologist:  Nelva Bush, MD  ?[facility] Electrophysiologist:  Vickie Epley, MD  ? ?Referring MD: [name], PA-C  ? ?Chief Complaint: 2 month follow-up ? ?History of Present Illness:   ? ?Jacob Harrison is a 81 y.o. male with a hx of CAD s/p CABG and redo CABG, DM2, HFpEF, HLD, chronic leg pain due to spinal stenosis, ICM, HFrEF who presents for follow-up.  ?  ?Seen in November 2020 with bradycardia recommended to cut back on Carvedilol. Due to suboptimal BP control, Amlodipine was increased. When seen May 2021 noted progressive exertional dyspnea. A soft systolic murmur was noted and echocardiogram ordered. He was started on Lasix '20mg'$  daily for pretibial edema.  ?  ?Echo 09/23/19 with LVEF 60-65%, no RWMA, G1DD, RV normal size and function, moderate AV sclerosis without stenosis.  Reviewed in depth during clinic visit. ?  ?The patient had NSTEMI 09/2020 at which time cardiac cath showed LMCA with mild diffuse disease, LAD with moderate diffuse disease and chronic subtotal occlusion of the mid vessel. Distal LAD fills viz left to right collaterals. LCX with moderate mid vessel disease, RCA with CTO, filling L>R collaterals, SVG to RPDA,LAD, and OM with CTO, LIMA to LAD known to be atretic. LVEDP 31mHg. Recommendations for medical therapy.  ?  ?Seen 11/14/20 and denied angina. BP was soft. Recommended discontinuation of Imdur if EF was low on Echo. Echo showed LVEF 30-35%. Lasix was increased ?  ?Seen 12/21/20 and was doing better on increased dose of lasix. Imdur was stopped. Start Losartan if BP can tolerate. ?  ?Seen 10/10 and had hypotension. Repeat echo was ordered, which showed persistently low EF 30-35%, and he was sent to EP for ICD consideration.  ?  ?HE saw EP 03/06/21 for ICD consideration and underwent implantation  03/20/21.  ?  ?Seen 04/26/21 and reported jaw pain. BP was soft, recommended he take Imdur '15mg'$  BID.  ? ?Last seen 05/28/21 and was doing better on Imdur twice daily.  ? ?Today,  the patient reports he is doing better. No further chest pain. Has not needed NTG. He still has some DOE. BP is good today, discussed addition of Losartan for low EF. No LLE, orthopnea, or pnd. He saw EP and ICD is functioning normally.  ? ?Past Medical History:  ?Diagnosis Date  ? (HFpEF) heart failure with preserved [place] fraction [place]   ? a. 10/2016 Echo: EF 65-70%, mild LVH, mildly dil LA. RV fxn nl.  ? Arthritis   ? BPH (benign prostatic hyperplasia)   ? Coronary artery disease   ? a. 2001 CABG x 3 ([facility]: LIMA->LAD, VG->LCX, VG->RPDA; b. 04/05/2010 Cath ([facility]): LM 360mLAD 10049mCX 25m49mA 100p, LIMA->LAD atretic, VG->LCX 100, VG->RCA 80ost, 70d; c. 04/2010 Redo CABG x 2 ([place]): VG->LAD, VG->RPDA; d. 10/2012 Cath ([facility] 20-30, LAD 100/95-110m,34m 40-78m, 58m100p, 80-100d, RPDA 99, RPL1 95, VG->LAD ok, VG->RPDA ok, EF 67%; e. 11/2016 MV: EF 48%, No ischemia.  ? Diabetes mellitus without complication (HCC)  [place]  ? Hyperlipidemia   ? Hypertension   ? Ischemic cardiomyopathy   ? a. 03/2021 s/p MDT Visia AF MRI VR SureScan single lead AICD (ser# [id number]? Scoliosis   ? Spinal stenosis   ? ? ?Past Surgical History:  ?Procedure Laterality Date  ?  CARDIAC CATHETERIZATION    ? CORONARY ARTERY BYPASS GRAFT    ? x 2 (2001 and redo in late 2011 or early 2012)  ? HERNIA REPAIR Left   ? inguinal  ? ICD IMPLANT N/A 03/20/2021  ? Procedure: ICD IMPLANT;  Surgeon: Vickie Epley, MD;  Location: [place] [facility];  Service: Cardiovascular;  Laterality: N/A;  ? LEFT HEART CATH AND CORS/GRAFTS ANGIOGRAPHY N/A 10/04/2020  ? Procedure: LEFT HEART CATH AND CORS/GRAFTS ANGIOGRAPHY;  Surgeon: Leonie Man, MD;  Location: [place] [facility];  Service: Cardiovascular;  Laterality: N/A;  ? LUMBAR LAMINECTOMY/DECOMPRESSION  MICRODISCECTOMY N/A 10/20/2018  ? Procedure: L3-4, L4-5 LUMBAR DECOMPRESSION with dural repair.;  Surgeon: Marybelle Killings, MD;  Location: [place];  Service: Orthopedics;  Laterality: N/A;  ? TONSILLECTOMY AND ADENOIDECTOMY    ? TRANSURETHRAL RESECTION OF PROSTATE  1990  ? ? ?Current Medications: ?Current Meds  ?Medication Sig  ? acetaminophen (TYLENOL) 500 MG tablet Take 1,000 mg by mouth every 6 (six) hours as needed for moderate pain or headache.  ? aspirin EC 81 MG tablet Take 1 tablet (81 mg total) by mouth daily.  ? carvedilol (COREG) 12.5 MG tablet TAKE 1 TABLET (12.'5MG'$  TOTAL) BY MOUTH TWICE A DAY WITH MEALS  ? Cholecalciferol (VITAMIN D-3 PO) Take 800 Units by mouth daily.  ? clopidogrel (PLAVIX) 75 MG tablet Take 1 tablet (75 mg total) by mouth daily.  ? Coenzyme Q10 (CO Q-10) 200 MG CAPS Take 200 mg by mouth daily.   ? cyanocobalamin 500 MCG tablet Take 500 mcg by mouth daily.  ? diclofenac Sodium (VOLTAREN) 1 % GEL Apply 2 g topically 2 (two) times daily as needed.  ? ezetimibe (ZETIA) 10 MG tablet TAKE 1 TABLET BY MOUTH EVERY DAY  ? finasteride (PROSCAR) 5 MG tablet TAKE 1 TABLET BY MOUTH EVERY DAY  ? furosemide (LASIX) 40 MG tablet TAKE 1 TABLET BY MOUTH TWICE A DAY  ? Glucosamine-Chondroit-Vit C-Mn (GLUCOSAMINE 1500 COMPLEX) CAPS Take 1 capsule by mouth daily.  ? isosorbide mononitrate (IMDUR) 30 MG 24 hr tablet Take 30 mg (1 tablet) in the morning and 15 mg (1/2 tablet) in the evening.  ? Lancets (ONETOUCH ULTRASOFT) lancets Use to check blood sugars fasting daily  ? losartan (COZAAR) 25 MG tablet Take 1 tablet (25 mg total) by mouth daily.  ? metFORMIN (GLUCOPHAGE) 500 MG tablet 1/2 tablet by mouth daily with food  ? Multiple Vitamins-Minerals (PRESERVISION AREDS 2 PO) Take 1 capsule by mouth 2 (two) times a day.  ? nitroGLYCERIN (NITROSTAT) 0.4 MG SL tablet PLACE 1 TABLET UNDER THE TONGUE EVERY 5 MINUTES AS NEEDED.  ? ranolazine (RANEXA) 1000 MG SR tablet TAKE 1 TABLET BY MOUTH TWICE A DAY  ?  rosuvastatin (CRESTOR) 20 MG tablet TAKE 1 TABLET BY MOUTH EVERYDAY AT BEDTIME  ?  ? ?Allergies:   Other and Tetracyclines & related  ? ?Social History  ? ?Socioeconomic History  ? Marital status: Married  ?  Spouse name: Holley Raring  ? Number of children: 2  ? Years of education: Not on file  ? Highest education level: Not on file  ?Occupational History  ? Occupation: retired  ?Tobacco Use  ? Smoking status: Never  ? Smokeless tobacco: Never  ?Vaping Use  ? Vaping Use: Never used  ?Substance and Sexual Activity  ? Alcohol use: Not Currently  ?  Alcohol/week: 11.0 standard drinks  ?  Types: 5 Glasses of wine, 6 Cans of beer per week  ?  Comment: 5-7 drinks a week (was 10-15 drinks/week)  ? Drug use: No  ? Sexual activity: Not Currently  ?  Birth control/protection: None  ?Other Topics Concern  ? Not on file  ?Social History Narrative  ? Not on file  ? ?Social Determinants of Health  ? ?Financial Resource Strain: Low Risk   ? Difficulty of Paying Living Expenses: Not very hard  ?Food Insecurity: No Food Insecurity  ? Worried About Charity fundraiser in the Last Year: Never true  ? Ran Out of Food in the Last Year: Never true  ?Transportation Needs: No Transportation Needs  ? Lack of Transportation (Medical): No  ? Lack of Transportation (Non-Medical): No  ?Physical Activity: Insufficiently Active  ? Days of Exercise per Week: 3 days  ? Minutes of Exercise per Session: 20 min  ?Stress: No Stress Concern Present  ? Feeling of Stress : Not at all  ?Social Connections: Moderately Integrated  ? Frequency of Communication with Friends and Family: More than three times a week  ? Frequency of Social Gatherings with Friends and Family: Twice a week  ? Attends Religious Services: Never  ? Active Member of Clubs or Organizations: Yes  ? Attends Archivist Meetings: More than 4 times per year  ? Marital Status: Married  ?  ? ?Family History: ?The patient's family history includes Alcohol abuse in his maternal uncle; Cancer  in his father and paternal uncle; Diabetes in his maternal uncle and mother; Hyperlipidemia in his maternal uncle and mother; Skin cancer in his brother. ? ?ROS:   ?Please see the history of present illness.    ? All

## 2021-07-31 NOTE — Patient Instructions (Signed)
Medication Instructions:  ?Your physician has recommended you make the following change in your medication:  ? ?START taking losartan (Cozaar) 25 mg daily  ? ?*If you need a refill on your cardiac medications before your next appointment, please call your pharmacy* ? ? ?Lab Work: ?Your physician recommends that you return for lab work (BMET) in: 1-2 weeks ? ?[facility] Entrance at [facility] ?1st desk on the right to check in, past the screening table ?Lab hours: Monday- Friday (7:30 am- 5:30 pm)  ? ?If you have labs (blood work) drawn today and your tests are completely normal, you will receive your results only by: ?MyChart Message (if you have MyChart) OR ?A paper copy in the mail ?If you have any lab test that is abnormal or we need to change your treatment, we will call you to review the results. ? ? ?Testing/Procedures: ?None ordered ? ? ?Follow-Up: ?At [facility], you and your health needs are our priority.  As part of our continuing mission to provide you with exceptional heart care, we have created designated Provider Care Teams.  These Care Teams include your primary Cardiologist (physician) and Advanced Practice Providers (APPs -  Physician Assistants and Nurse Practitioners) who all work together to provide you with the care you need, when you need it. ? ?We recommend signing up for the patient portal called "MyChart".  Sign up information is provided on this After Visit Summary.  MyChart is used to connect with patients for Virtual Visits (Telemedicine).  Patients are able to view lab/test results, encounter notes, upcoming appointments, etc.  Non-urgent messages can be sent to your provider as well.   ?To learn more about what you can do with MyChart, go to [email or web address].   ? ?Your next appointment:   ?1 month(s) ? ?The format for your next appointment:   ?In Person ? ?Provider:   ?You may see Nelva Bush, MD or one of the following Advanced Practice Providers on your designated Care  Team:   ?Murray Hodgkins, NP ?Christell Faith, PA-C ?Cadence Kathlen Mody, PA-C ? ? ?Other Instructions ? ? ?Important Information About Sugar ? ? ? ? ?  ?

## 2021-08-04 ENCOUNTER — Other Ambulatory Visit: Payer: Self-pay | Admitting: Internal Medicine

## 2021-08-13 ENCOUNTER — Other Ambulatory Visit
Admission: RE | Admit: 2021-08-13 | Discharge: 2021-08-13 | Disposition: A | Discharge status: Home / Self Care | Payer: Medicare Other | Attending: Medical | Admitting: Medical

## 2021-08-13 DIAGNOSIS — I5022 Chronic systolic (congestive) heart failure: Secondary | ICD-10-CM | POA: Insufficient documentation

## 2021-08-13 LAB — BASIC METABOLIC PANEL
Anion gap: 8 (ref 5–15)
BUN: 17 mg/dL (ref 8–23)
CO2: 26 mmol/L (ref 22–32)
Calcium: 9.1 mg/dL (ref 8.9–10.3)
Chloride: 99 mmol/L (ref 98–111)
Creatinine, Ser: 0.92 mg/dL (ref 0.61–1.24)
GFR, Estimated: >60 mL/min (ref >60)
Glucose, Bld: 108 mg/dL — ABNORMAL HIGH (ref 70–99)
Potassium: 4.7 mmol/L (ref 3.5–5.1)
Sodium: 133 mmol/L — ABNORMAL LOW (ref 135–145)

## 2021-08-22 ENCOUNTER — Other Ambulatory Visit: Payer: Self-pay | Admitting: Internal Medicine

## 2021-09-04 ENCOUNTER — Ambulatory Visit: Payer: Medicare Other | Admitting: Medical

## 2021-09-04 ENCOUNTER — Encounter: Payer: Self-pay | Admitting: Medical

## 2021-09-04 VITALS — BP 100/60 | HR 57 | Ht 66.0 in | Wt 163.0 lb

## 2021-09-04 DIAGNOSIS — I25118 Atherosclerotic heart disease of native coronary artery with other forms of angina pectoris: Secondary | ICD-10-CM

## 2021-09-04 DIAGNOSIS — E782 Mixed hyperlipidemia: Secondary | ICD-10-CM

## 2021-09-04 DIAGNOSIS — I5022 Chronic systolic (congestive) heart failure: Secondary | ICD-10-CM

## 2021-09-04 DIAGNOSIS — I255 Ischemic cardiomyopathy: Secondary | ICD-10-CM | POA: Diagnosis not present

## 2021-09-04 DIAGNOSIS — I10 Essential (primary) hypertension: Secondary | ICD-10-CM | POA: Diagnosis not present

## 2021-09-04 MED ORDER — LOSARTAN POTASSIUM 25 MG PO TABS: 12.5000 mg | ORAL_TABLET | Freq: Every day | ORAL | 3 refills | Status: DC

## 2021-09-04 NOTE — Progress Notes (Unsigned)
Cardiology Office Note:    Date:  09/06/2021   ID:  Jacob Harrison, DOB 07-14-1940, MRN [id number]  PCP:  [name], PA-C  [facility] Cardiologist:  Nelva Bush, MD  [facility] Electrophysiologist:  Vickie Epley, MD   Referring MD: [name], PA-C   Chief Complaint: 1 month follow-up  History of Present Illness:    Jacob Harrison is a 81 y.o. male with a hx of  CAD s/p CABG and redo CABG, DM2, HFpEF, HLD, chronic leg pain due to spinal stenosis, ICM, HFrEF who presents for follow-up.    Seen in November 2020 with bradycardia recommended to cut back on Carvedilol. Due to suboptimal BP control, Amlodipine was increased. When seen May 2021 noted progressive exertional dyspnea. A soft systolic murmur was noted and echocardiogram ordered. He was started on Lasix '20mg'$  daily for pretibial edema.    Echo 09/23/19 with LVEF 60-65%, no RWMA, G1DD, RV normal size and function, moderate AV sclerosis without stenosis.  Reviewed in depth during clinic visit.   The patient had NSTEMI 09/2020 at which time cardiac cath showed LMCA with mild diffuse disease, LAD with moderate diffuse disease and chronic subtotal occlusion of the mid vessel. Distal LAD fills viz left to right collaterals. LCX with moderate mid vessel disease, RCA with CTO, filling L>R collaterals, SVG to RPDA,LAD, and OM with CTO, LIMA to LAD known to be atretic. LVEDP 80mHg. Recommendations for medical therapy.    Seen 11/14/20 and denied angina. BP was soft. Recommended discontinuation of Imdur if EF was low on Echo. Echo showed LVEF 30-35%. Lasix was increased   Seen 10/10 and had hypotension. Repeat echo was ordered, which showed persistently low EF 30-35%, and he was sent to EP for ICD consideration.    He saw EP 03/06/21 for ICD consideration and underwent implantation 03/20/21.    Seen 04/26/21 and reported jaw pain. BP was soft, recommended he take Imdur  Last seen 07/31/21 and was overall  doing well. Losartan was started for HFrEF.  Today, the patient reports losartan may be dropping BP too much. Says he is hypotensive at times. He denies dizziness and blackouts. He reports breathing is short at times. Reports compliance with lasix '40mg'$  BID. Says left foot swelling is worse than the right foot. No chest pain. Feels a little more short of breath. Says he can't bike as long as before.    Past Medical History:  Diagnosis Date   (HFpEF) heart failure with preserved ejection fraction ([place]    a. 10/2016 Echo: EF 65-70%, mild LVH, mildly dil LA. RV fxn nl.   Arthritis    BPH (benign prostatic hyperplasia)    Coronary artery disease    a. 2001 CABG x 3 ([facility]: LIMA->LAD, VG->LCX, VG->RPDA; b. 04/05/2010 Cath ([place]): LM 338mLAD 10032mCX 17m39mA 100p, LIMA->LAD atretic, VG->LCX 100, VG->RCA 80ost, 70d; c. 04/2010 Redo CABG x 2 ([place]): VG->LAD, VG->RPDA; d. 10/2012 Cath ([facility] 20-30, LAD 100/95-71m,9m 40-102m, 37m100p, 80-100d, RPDA 99, RPL1 95, VG->LAD ok, VG->RPDA ok, EF 67%; e. 11/2016 MV: EF 48%, No ischemia.   Diabetes mellitus without complication (HCC)  [place]   Hyperlipidemia    Hypertension    Ischemic cardiomyopathy    a. 03/2021 s/p MDT Visia AF MRI VR SureScan single lead AICD (ser# [id number] Scoliosis    Spinal stenosis     Past Surgical History:  Procedure Laterality Date   CARDIAC CATHETERIZATION  CORONARY ARTERY BYPASS GRAFT     x 2 (2001 and redo in late 2011 or early 2012)   HERNIA REPAIR Left    inguinal   ICD IMPLANT N/A 03/20/2021   Procedure: ICD IMPLANT;  Surgeon: Vickie Epley, MD;  Location: [place] [facility];  Service: Cardiovascular;  Laterality: N/A;   LEFT HEART CATH AND CORS/GRAFTS ANGIOGRAPHY N/A 10/04/2020   Procedure: LEFT HEART CATH AND CORS/GRAFTS ANGIOGRAPHY;  Surgeon: Leonie Man, MD;  Location: [place] [facility];  Service: Cardiovascular;  Laterality: N/A;   LUMBAR LAMINECTOMY/DECOMPRESSION  MICRODISCECTOMY N/A 10/20/2018   Procedure: L3-4, L4-5 LUMBAR DECOMPRESSION with dural repair.;  Surgeon: Marybelle Killings, MD;  Location: [place] [facility];  Service: Orthopedics;  Laterality: N/A;   TONSILLECTOMY AND ADENOIDECTOMY     TRANSURETHRAL RESECTION OF PROSTATE  1990    Current Medications: Current Meds  Medication Sig   acetaminophen (TYLENOL) 500 MG tablet Take 1,000 mg by mouth every 6 (six) hours as needed for moderate pain or headache.   aspirin EC 81 MG tablet Take 1 tablet (81 mg total) by mouth daily.   carvedilol (COREG) 12.5 MG tablet TAKE 1 TABLET (12.'5MG'$  TOTAL) BY MOUTH TWICE A DAY WITH MEALS   Cholecalciferol (VITAMIN D-3 PO) Take 800 Units by mouth daily.   clopidogrel (PLAVIX) 75 MG tablet Take 1 tablet (75 mg total) by mouth daily.   Coenzyme Q10 (CO Q-10) 200 MG CAPS Take 200 mg by mouth daily.    cyanocobalamin 500 MCG tablet Take 500 mcg by mouth daily.   diclofenac Sodium (VOLTAREN) 1 % GEL Apply 2 g topically 2 (two) times daily as needed.   ezetimibe (ZETIA) 10 MG tablet TAKE 1 TABLET BY MOUTH EVERY DAY   finasteride (PROSCAR) 5 MG tablet TAKE 1 TABLET BY MOUTH EVERY DAY   furosemide (LASIX) 40 MG tablet Take 40 mg by mouth 2 (two) times daily.   Glucosamine-Chondroit-Vit C-Mn (GLUCOSAMINE 1500 COMPLEX) CAPS Take 1 capsule by mouth daily.   isosorbide mononitrate (IMDUR) 30 MG 24 hr tablet Take 30 mg (1 tablet) in the morning and 15 mg (1/2 tablet) in the evening.   Lancets (ONETOUCH ULTRASOFT) lancets Use to check blood sugars fasting daily   metFORMIN (GLUCOPHAGE) 500 MG tablet 1/2 tablet by mouth daily with food   Multiple Vitamins-Minerals (PRESERVISION AREDS 2 PO) Take 1 capsule by mouth 2 (two) times a day.   nitroGLYCERIN (NITROSTAT) 0.4 MG SL tablet PLACE 1 TABLET UNDER THE TONGUE EVERY 5 MINUTES AS NEEDED.   ranolazine (RANEXA) 1000 MG SR tablet TAKE 1 TABLET BY MOUTH TWICE A DAY   rosuvastatin (CRESTOR) 20 MG tablet TAKE 1 TABLET BY MOUTH EVERYDAY AT BEDTIME    [DISCONTINUED] losartan (COZAAR) 25 MG tablet Take 1 tablet (25 mg total) by mouth daily.     Allergies:   Other and Tetracyclines & related   Social History   Socioeconomic History   Marital status: Married    Spouse name: Glenda   Number of children: 2   Years of education: Not on file   Highest education level: Not on file  Occupational History   Occupation: retired  Tobacco Use   Smoking status: Never   Smokeless tobacco: Never  Vaping Use   Vaping Use: Never used  Substance and Sexual Activity   Alcohol use: Not Currently    Alcohol/week: 11.0 standard drinks    Types: 5 Glasses of wine, 6 Cans of beer per week    Comment:  5-7 drinks a week (was 10-15 drinks/week)   Drug use: No   Sexual activity: Not Currently    Birth control/protection: None  Other Topics Concern   Not on file  Social History Narrative   Not on file   Social Determinants of Health   Financial Resource Strain: Low Risk    Difficulty of Paying Living Expenses: Not very hard  Food Insecurity: No Food Insecurity   Worried About Running Out of Food in the Last Year: Never true   Ran Out of Food in the Last Year: Never true  Transportation Needs: No Transportation Needs   Lack of Transportation (Medical): No   Lack of Transportation (Non-Medical): No  Physical Activity: Insufficiently Active   Days of Exercise per Week: 3 days   Minutes of Exercise per Session: 20 min  Stress: No Stress Concern Present   Feeling of Stress : Not at all  Social Connections: Moderately Integrated   Frequency of Communication with Friends and Family: More than three times a week   Frequency of Social Gatherings with Friends and Family: Twice a week   Attends Religious Services: Never   Marine scientist or Organizations: Yes   Attends Music therapist: More than 4 times per year   Marital Status: Married     Family History: The patient's family history includes Alcohol abuse in his maternal  uncle; Cancer in his father and paternal uncle; Diabetes in his maternal uncle and mother; Hyperlipidemia in his maternal uncle and mother; Skin cancer in his brother.  ROS:   Please see the history of present illness.     All other systems reviewed and are negative.  EKGs/Labs/Other Studies Reviewed:    The following studies were reviewed today:  Echo 11/27/20 1. Left ventricular ejection fraction, by estimation, is 30 to 35%. The  left ventricle has moderately decreased function. The left ventricle  demonstrates regional wall motion abnormalities (see scoring  diagram/findings for description). There is mild  left ventricular hypertrophy. Left ventricular diastolic parameters are  consistent with Grade III diastolic dysfunction (restrictive). Elevated  left atrial pressure.   2. Right ventricular systolic function is mildly reduced. The right  ventricular size is normal. Tricuspid regurgitation signal is inadequate  for assessing PA pressure.   3. Left atrial size was mildly dilated.   4. The mitral valve is degenerative. Mild to moderate mitral valve  regurgitation. No evidence of mitral stenosis.   5. The aortic valve is tricuspid. There is mild calcification of the  aortic valve. There is mild thickening of the aortic valve. Aortic valve  regurgitation is not visualized. Mild aortic valve stenosis. Aortic valve  area, by VTI measures 1.97 cm.  Aortic valve mean gradient measures 9.0 mmHg.   6. The inferior vena cava is normal in size with greater than 50%  respiratory variability, suggesting right atrial pressure of 3 mmHg.   Cardiac cath 10/04/20 -----SEVERE ~ 3 VESSEL NATIVE CAD------ Mid LAD to Dist LAD lesion is 99% stenosed. Mid Cx lesion is 40% stenosed with 40% stenosed side branch in LPAV. Prox RCA to Dist RCA lesion is 100% stenosed. ---- GRAFTS----- SVG-RPDA graft was visualized by angiography. Prox Graft to Insertion lesion is 100% stenosed. SVG-LAD graft was  visualized by angiography. Origin to Prox Graft lesion is 100% stenosed. (flush occluded) Unable to Visualize old Grafts (original SVG-rPDA & SVG-LCx). 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 ------ LV end diastolic pressure is moderately elevated. There is no  aortic valve stenosis.   SUMMARY Severe native CAD: Long left main the trifurcates into LAD, LCx and RI. The LAD is subtotally occluded after major septal perforator (that provides collaterals to the PDA).  After the septal perforator the vessel essentially occluded yet still gives rise to a diagonal branch and 2 more septal perforators.  There are bridging collaterals filling the distal vessel with faint late filling.  The diagonal branch is small in caliber, but provides collaterals to the distal ramus as well as bridging collaterals to the LAD.  (The 2 small septal perforators do provide collaterals to the PDA) Moderate caliber RI is occluded proximally LCx is roughly 40% mid stenosis at bifurcation into major lateral OM and AV groove branch.  Both branches provide collaterals to the RPL system. RCA 100% proximal occluded.   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 There are ring markers indicating likely the new grafts:  New SVG-RPDA 100% proximal occlusion.  There is roughly 20 mm of dye hang up indicating relatively recent occlusion-likely culprit lesion.  Not likely to be salvaged.  Given the extent of collateral flow to the distal RCA system, likely competitive flow would keep the graft closed. Subacute to chronic flush occlusion of the new SVG-LAD.   Moderate to Severely Elevated LVEDP c/w Diastolic CHF (with ongoing CP & SOB - Acute on Chronic)         Glenetta Hew, MD     Antiplatelet/Anticoag Recommend Aspirin '81mg'$  daily for moderate CAD. At a minimum aspirin, but would also be reasonable to use Plavix alone prophylactically.  Discharge Date  Anticipated discharge date to be determined. Initial plan to discharge canceled.  Patient had significant dyspnea with rales and hypoxia upon arrival to the Short Stay Unit.  This was potential anticipated and post-cath orders with Lasix 40 mg ordered and administered.  Patient was placed on oxygen and symptomatically improved after diuresis.  Will admit as outpatient extended recovery for overnight monitoring.  This will also allow [place] to potentially titrate antianginal medications while he is inpatient. Unlikely that he would be candidate for a third CABG.      Coronary Diagrams   Diagnostic Dominance: Right            EKG:  EKG is ordered today.  The ekg ordered today demonstrates SB 57bpm, inf q waves, low voltage, nonspecific T wave changes  Recent Labs: 10/25/2020: BNP 599.9 01/15/2021: ALT 19 03/06/2021: Hemoglobin 14.6; Platelets 256 08/13/2021: BUN 17; Creatinine, Ser 0.92; Potassium 4.7; Sodium 133  Recent Lipid Panel    Component Value Date/Time   CHOL 140 08/31/2020 0848   TRIG 156 (H) 08/31/2020 0848   HDL 50 08/31/2020 0848   CHOLHDL 2.8 08/31/2020 0848   CHOLHDL 2.7 11/14/2016 0911   VLDL 27 11/14/2016 0911   LDLCALC 63 08/31/2020 0848    Physical Exam:    VS:  BP 100/60 (BP Location: Left Arm, Patient Position: Sitting, Cuff Size: Normal)   Pulse (!) 57   Ht '5\' 6"'$  (1.676 m)   Wt 163 lb (73.9 kg)   SpO2 98%   BMI 26.31 kg/m     Wt Readings from Last 3 Encounters:  09/04/21 163 lb (73.9 kg)  07/31/21 162 lb (73.5 kg)  06/19/21 164 lb (74.4 kg)     GEN:  Well nourished, well developed in no acute distress HEENT: Normal NECK: No JVD; No carotid bruits LYMPHATICS: No  lymphadenopathy CARDIAC: RRR, no murmurs, rubs, gallops RESPIRATORY:  Clear to auscultation without rales, wheezing or rhonchi  ABDOMEN: Soft, non-tender, non-distended MUSCULOSKELETAL:  No edema; No deformity  SKIN: Warm and dry NEUROLOGIC:  Alert and oriented x 3 PSYCHIATRIC:   Normal affect   ASSESSMENT:    1. Coronary artery disease of native artery of native heart with stable angina pectoris ([place])   2. Chronic systolic heart failure ([place])   3. Ischemic cardiomyopathy   4. Mixed hyperlipidemia   5. Essential hypertension    PLAN:    In order of problems listed above:  CAD with prior CABG and stable angina Patient denies anginal symptoms. Continue Imdur '30mg'$ /'15mg'$  daily, Ranexa '1000mg'$  BID, Aspirin, Coreg 12.'5mg'$  BID, and statin therapy. No further ischemic work-up indicated.   HFrEF ICM s/p ICD BP soft with initiation of Losartan, we will decrease this to 12.'5mg'$  daily. Has mild LLE on exam and reports worse DOE. We will increase lasix '80mg'$  am and '40mg'$  pm for the next 3 days, then back down to '40mg'$  BID. Continue Coreg 12.'5mg'$  BID, Losartan 12.'5mg'$  daily. BP limiting addition of GDMT. Weights are stable today.   HLD LDL 63 08/2020. Continue Crestor.   Disposition: Follow up in 3 month(s) with MD/APP     Signed, Cheney Gosch Ninfa Meeker, PA-C  09/06/2021 12:55 PM    [facility]

## 2021-09-04 NOTE — Patient Instructions (Addendum)
Medication Instructions:  - Your physician has recommended you make the following change in your medication:   1) DECREASE Losartan 25 mg: - take 0.5 tablet (12.5 mg) by mouth once daily   2) CHANGE Lasix (furosemide) 40 mg: - take 2 tablets (80 mg) by mouth in the morning & 1 tablet (40 mg) by mouth in the evening x 3 days  - then, RESUME 1 tablet (40 mg) by mouth twice daily    *If you need a refill on your cardiac medications before your next appointment, please call your pharmacy*   Lab Work:  None ordered  If you have labs (blood work) drawn today and your tests are completely normal, you will receive your results only by: [place] (if you have MyChart) OR A paper copy in the mail If you have any lab test that is abnormal or we need to change your treatment, we will call you to review the results.   Testing/Procedures: - none ordered   Follow-Up: At [facility], you and your health needs are our priority.  As part of our continuing mission to provide you with exceptional heart care, we have created designated Provider Care Teams.  These Care Teams include your primary Cardiologist (physician) and Advanced Practice Providers (APPs -  Physician Assistants and Nurse Practitioners) who all work together to provide you with the care you need, when you need it.  We recommend signing up for the patient portal called "MyChart".  Sign up information is provided on this After Visit Summary.  MyChart is used to connect with patients for Virtual Visits (Telemedicine).  Patients are able to view lab/test results, encounter notes, upcoming appointments, etc.  Non-urgent messages can be sent to your provider as well.   To learn more about what you can do with MyChart, go to [email or web address].    Your next appointment:   3 month(s)  The format for your next appointment:   In Person  Provider:   You may see Nelva Bush, MD or one of the following Advanced Practice  Providers on your designated Care Team:    Cadence Kathlen Mody, [place]    Other Instructions N/a  Important Information About Sugar

## 2021-09-16 ENCOUNTER — Other Ambulatory Visit: Payer: Self-pay | Admitting: Internal Medicine

## 2021-09-16 NOTE — Telephone Encounter (Signed)
Rx request sent to pharmacy.  

## 2021-09-18 ENCOUNTER — Ambulatory Visit (INDEPENDENT_AMBULATORY_CARE_PROVIDER_SITE_OTHER): Payer: Medicare Other

## 2021-09-18 DIAGNOSIS — I255 Ischemic cardiomyopathy: Secondary | ICD-10-CM | POA: Diagnosis not present

## 2021-09-21 LAB — CUP PACEART REMOTE DEVICE CHECK
Battery Remaining Longevity: 129 mo
Battery Voltage: 3.05 V
Brady Statistic RV Percent Paced: 0.23 %
Date Time Interrogation Session: 20230614012503
HighPow Impedance: 70 Ohm
Implantable Lead Implant Date: 20221214
Implantable Lead Location: 753860
Implantable Pulse Generator Implant Date: 20221214
Lead Channel Impedance Value: 304 Ohm
Lead Channel Impedance Value: 456 Ohm
Lead Channel Pacing Threshold Amplitude: 1 V
Lead Channel Pacing Threshold Pulse Width: 0.4 ms
Lead Channel Sensing Intrinsic Amplitude: 12.125 mV
Lead Channel Sensing Intrinsic Amplitude: 12.125 mV
Lead Channel Setting Pacing Amplitude: 1.5 V
Lead Channel Setting Pacing Pulse Width: 0.4 ms
Lead Channel Setting Sensing Sensitivity: 0.3 mV

## 2021-09-28 ENCOUNTER — Other Ambulatory Visit: Payer: Self-pay | Admitting: Internal Medicine

## 2021-09-28 DIAGNOSIS — I25118 Atherosclerotic heart disease of native coronary artery with other forms of angina pectoris: Secondary | ICD-10-CM

## 2021-10-21 ENCOUNTER — Other Ambulatory Visit: Payer: Self-pay | Admitting: Medical

## 2021-10-22 ENCOUNTER — Ambulatory Visit (INDEPENDENT_AMBULATORY_CARE_PROVIDER_SITE_OTHER): Payer: Medicare Other | Admitting: Physician Assistant

## 2021-10-22 ENCOUNTER — Encounter: Payer: Self-pay | Admitting: Physician Assistant

## 2021-10-22 VITALS — BP 105/60 | HR 60 | Wt 159.9 lb

## 2021-10-22 DIAGNOSIS — E785 Hyperlipidemia, unspecified: Secondary | ICD-10-CM

## 2021-10-22 DIAGNOSIS — E119 Type 2 diabetes mellitus without complications: Secondary | ICD-10-CM

## 2021-10-22 DIAGNOSIS — E1169 Type 2 diabetes mellitus with other specified complication: Secondary | ICD-10-CM

## 2021-10-22 DIAGNOSIS — E1159 Type 2 diabetes mellitus with other circulatory complications: Secondary | ICD-10-CM

## 2021-10-22 DIAGNOSIS — R2681 Unsteadiness on feet: Secondary | ICD-10-CM | POA: Diagnosis not present

## 2021-10-22 DIAGNOSIS — I152 Hypertension secondary to endocrine disorders: Secondary | ICD-10-CM

## 2021-10-22 LAB — POCT GLYCOSYLATED HEMOGLOBIN (HGB A1C): HbA1c, POC (controlled diabetic range): 5.1 % (ref 0.0–7.0)

## 2021-10-22 NOTE — Patient Instructions (Signed)

## 2021-10-22 NOTE — Progress Notes (Signed)
Established patient visit   Patient: Jacob Harrison   DOB: 1940/11/29   81 y.o. Male  MRN: [id number] Visit Date: 10/22/2021  Chief Complaint  Patient presents with   Diabetes   Subjective    HPI  Patient presents for chronic follow-up. Patient reports continues to have unsteady balance. Reports is careful and limits certain activities such as using a ladder.   Diabetes mellitus: No increased urination or thirst. Pt reports medication compliance. No hypoglycemic events. Checking glucose at home. FBS average 100.   HTN: Pt denies chest pain, palpitations, dizziness or syncope. States lower extremity swelling has been better with increased dose of Lasix. Taking medication as directed without side effects. Checks BP at home and readings range in 95-100/60. Limits salt intake.   HLD: Pt taking medication as directed without issues. States does limit fat intake.   Medications: Outpatient Medications Prior to Visit  Medication Sig   acetaminophen (TYLENOL) 500 MG tablet Take 1,000 mg by mouth every 6 (six) hours as needed for moderate pain or headache.   aspirin EC 81 MG tablet Take 1 tablet (81 mg total) by mouth daily.   carvedilol (COREG) 12.5 MG tablet TAKE 1 TABLET (12.5MG TOTAL) BY MOUTH TWICE A DAY WITH MEALS   Cholecalciferol (VITAMIN D-3 PO) Take 800 Units by mouth daily.   clopidogrel (PLAVIX) 75 MG tablet Take 1 tablet (75 mg total) by mouth daily.   Coenzyme Q10 (CO Q-10) 200 MG CAPS Take 200 mg by mouth daily.    cyanocobalamin 500 MCG tablet Take 500 mcg by mouth daily.   diclofenac Sodium (VOLTAREN) 1 % GEL Apply 2 g topically 2 (two) times daily as needed.   ezetimibe (ZETIA) 10 MG tablet TAKE 1 TABLET BY MOUTH EVERY DAY   finasteride (PROSCAR) 5 MG tablet TAKE 1 TABLET BY MOUTH EVERY DAY   furosemide (LASIX) 40 MG tablet Take 40 mg by mouth 2 (two) times daily.   Glucosamine-Chondroit-Vit C-Mn (GLUCOSAMINE 1500 COMPLEX) CAPS Take 1 capsule by mouth daily.    isosorbide mononitrate (IMDUR) 30 MG 24 hr tablet Take 30 mg (1 tablet) in the morning and 15 mg (1/2 tablet) in the evening.   Lancets (ONETOUCH ULTRASOFT) lancets Use to check blood sugars fasting daily   losartan (COZAAR) 25 MG tablet Take 0.5 tablets (12.5 mg total) by mouth daily.   metFORMIN (GLUCOPHAGE) 500 MG tablet 1/2 tablet by mouth daily with food   Multiple Vitamins-Minerals (PRESERVISION AREDS 2 PO) Take 1 capsule by mouth 2 (two) times a day.   nitroGLYCERIN (NITROSTAT) 0.4 MG SL tablet PLACE 1 TABLET UNDER THE TONGUE EVERY 5 MINUTES AS NEEDED.   ranolazine (RANEXA) 1000 MG SR tablet TAKE 1 TABLET BY MOUTH TWICE A DAY   rosuvastatin (CRESTOR) 20 MG tablet TAKE 1 TABLET BY MOUTH EVERYDAY AT BEDTIME   No facility-administered medications prior to visit.    Review of Systems Review of Systems:  A fourteen system review of systems was performed and found to be positive as per HPI.  Last CBC Lab Results  Component Value Date   WBC 13.0 (H) 03/06/2021   HGB 14.6 03/06/2021   HCT 43.5 03/06/2021   MCV 95 03/06/2021   MCH 31.9 03/06/2021   RDW 13.2 03/06/2021   PLT 256 40/98/1191   Last metabolic panel Lab Results  Component Value Date   GLUCOSE 108 (H) 08/13/2021   NA 133 (L) 08/13/2021   K 4.7 08/13/2021   CL 99 08/13/2021   CO2  26 08/13/2021   BUN 17 08/13/2021   CREATININE 0.92 08/13/2021   GFRNONAA >60 08/13/2021   CALCIUM 9.1 08/13/2021   PROT 6.7 01/15/2021   ALBUMIN 4.8 (H) 01/15/2021   LABGLOB 1.9 01/15/2021   AGRATIO 2.5 (H) 01/15/2021   BILITOT 0.6 01/15/2021   ALKPHOS 84 01/15/2021   AST 19 01/15/2021   ALT 19 01/15/2021   ANIONGAP 8 08/13/2021   Last lipids Lab Results  Component Value Date   CHOL 140 08/31/2020   HDL 50 08/31/2020   LDLCALC 63 08/31/2020   TRIG 156 (H) 08/31/2020   CHOLHDL 2.8 08/31/2020   Last hemoglobin A1c Lab Results  Component Value Date   HGBA1C 5.1 10/22/2021   Last thyroid functions Lab Results  Component  Value Date   TSH 2.180 08/31/2020   Last vitamin D Lab Results  Component Value Date   VD25OH 32.3 07/30/2017     Objective    BP 105/60   Pulse 60   Wt 159 lb 14.4 oz (72.5 kg)   SpO2 98% Comment: on RA  BMI 25.81 kg/m  BP Readings from Last 3 Encounters:  10/22/21 105/60  09/04/21 100/60  07/31/21 110/62   Wt Readings from Last 3 Encounters:  10/22/21 159 lb 14.4 oz (72.5 kg)  09/04/21 163 lb (73.9 kg)  07/31/21 162 lb (73.5 kg)    Physical Exam  General:  Pleasant and cooperative, appropriate for stated age.  Neuro:  Alert and oriented,  extra-ocular muscles intact  HEENT:  Normocephalic, atraumatic, neck supple  Skin:  no gross rash, warm, pink. Cardiac:  RRR, S1 S2, +murmur Respiratory: CTA B/L  MSK: unsteady and slow gait noted when walking, some muscle atrophy of both calves  Vascular:  Ext warm, no cyanosis apprec.; cap RF less 2 sec. Psych:  No HI/SI, judgement and insight good, Euthymic mood. Full Affect.   Results for orders placed or performed in visit on 10/22/21  POCT glycosylated hemoglobin (Hb A1C)  Result Value Ref Range   Hemoglobin A1C     HbA1c POC (<> result, manual entry)     HbA1c, POC (prediabetic range)     HbA1c, POC (controlled diabetic range) 5.1 0.0 - 7.0 %    Assessment & Plan      Problem List Items Addressed This Visit       Endocrine   Type 2 diabetes mellitus without complication, without long-term current use of insulin ([place]) - Primary (Chronic)    -Well controlled. A1c today 5.1. Will continue with metformin 250 mg once daily. Advised if A1c remains well controlled then I will consider discontinuing Metformin 250 mg daily. Discussed monitoring for hypoglycemia and management. Will continue to monitor.      Relevant Orders   POCT glycosylated hemoglobin (Hb A1C) (Completed)   CBC w/Diff   Comp Met (CMET)   Urine Microalbumin w/creat. ratio   Hyperlipidemia associated with type 2 diabetes mellitus (HCC) (Chronic)     -Last lipid panel: HDL 50, LDL 63 (goal<70). -Continue current medication regimen. -Patient will schedule lab visit for tomorrow morning for fasting labs. -Will continue to monitor.      Relevant Orders   CBC w/Diff   Comp Met (CMET)   Lipid Profile   Other Visit Diagnoses     Hypertension associated with diabetes ([place])       Relevant Orders   CBC w/Diff   Comp Met (CMET)   Unsteady gait       Relevant Orders  Ambulatory referral to Physical Therapy      Hypertension associated with diabetes: -Soft blood pressure in office on intake and even lower when repeated. BP improved after patient provided with liquids, likely dehydrated. Discussed to continue monitoring BP at home and notify cardiology if BP consistently <100/60. Cardiology recently decreased Losartan to 12.5 mg due to hypotension.   Unsteady gait: -Will place order for PT to work on strengthening exercises and improve gait. Patient is agreeable.    Return in about 4 months (around 02/22/2022) for DM, HTN, HLD; lab visit for FBW/microalbumin tomorrow.        [name], PA-C  [facility] at [facility] [phone] (phone) [phone] (fax)  [place]

## 2021-10-22 NOTE — Assessment & Plan Note (Addendum)
-  Well controlled. A1c today 5.1. Will continue with metformin 250 mg once daily. Advised if A1c remains well controlled then I will consider discontinuing Metformin 250 mg daily. Discussed monitoring for hypoglycemia and management. Will continue to monitor.

## 2021-10-22 NOTE — Assessment & Plan Note (Signed)
-  Last lipid panel: HDL 50, LDL 63 (goal<70). -Continue current medication regimen. -Patient will schedule lab visit for tomorrow morning for fasting labs. -Will continue to monitor.

## 2021-10-23 ENCOUNTER — Other Ambulatory Visit: Payer: Medicare Other

## 2021-10-23 DIAGNOSIS — E1169 Type 2 diabetes mellitus with other specified complication: Secondary | ICD-10-CM | POA: Diagnosis not present

## 2021-10-23 DIAGNOSIS — E119 Type 2 diabetes mellitus without complications: Secondary | ICD-10-CM | POA: Diagnosis not present

## 2021-10-23 DIAGNOSIS — E1159 Type 2 diabetes mellitus with other circulatory complications: Secondary | ICD-10-CM | POA: Diagnosis not present

## 2021-10-23 DIAGNOSIS — I152 Hypertension secondary to endocrine disorders: Secondary | ICD-10-CM | POA: Diagnosis not present

## 2021-10-23 DIAGNOSIS — E785 Hyperlipidemia, unspecified: Secondary | ICD-10-CM | POA: Diagnosis not present

## 2021-10-24 ENCOUNTER — Ambulatory Visit: Payer: Medicare Other | Admitting: Physician Assistant

## 2021-10-24 LAB — CBC WITH DIFFERENTIAL/PLATELET
Basophils Absolute: 0 10*3/uL (ref 0.0–0.2)
Basos: 0 %
EOS (ABSOLUTE): 0.1 10*3/uL (ref 0.0–0.4)
Eos: 1 %
Hematocrit: 38.8 % (ref 37.5–51.0)
Hemoglobin: 12.8 g/dL — ABNORMAL LOW (ref 13.0–17.7)
Immature Grans (Abs): 0 10*3/uL (ref 0.0–0.1)
Immature Granulocytes: 0 %
Lymphocytes Absolute: 2.5 10*3/uL (ref 0.7–3.1)
Lymphs: 23 %
MCH: 32.1 pg (ref 26.6–33.0)
MCHC: 33 g/dL (ref 31.5–35.7)
MCV: 97 fL (ref 79–97)
Monocytes Absolute: 1.1 10*3/uL — ABNORMAL HIGH (ref 0.1–0.9)
Monocytes: 10 %
Neutrophils Absolute: 7.1 10*3/uL — ABNORMAL HIGH (ref 1.4–7.0)
Neutrophils: 66 %
Platelets: 234 10*3/uL (ref 150–450)
RBC: 3.99 x10E6/uL — ABNORMAL LOW (ref 4.14–5.80)
RDW: 13.1 % (ref 11.6–15.4)
WBC: 10.8 10*3/uL (ref 3.4–10.8)

## 2021-10-24 LAB — COMPREHENSIVE METABOLIC PANEL
ALT: 18 IU/L (ref 0–44)
AST: 22 IU/L (ref 0–40)
Albumin/Globulin Ratio: 2.5 — ABNORMAL HIGH (ref 1.2–2.2)
Albumin: 4.8 g/dL (ref 3.8–4.8)
Alkaline Phosphatase: 89 IU/L (ref 44–121)
BUN/Creatinine Ratio: 15 (ref 10–24)
BUN: 13 mg/dL (ref 8–27)
Bilirubin Total: 0.7 mg/dL (ref 0.0–1.2)
CO2: 26 mmol/L (ref 20–29)
Calcium: 9.5 mg/dL (ref 8.6–10.2)
Chloride: 98 mmol/L (ref 96–106)
Creatinine, Ser: 0.88 mg/dL (ref 0.76–1.27)
Globulin, Total: 1.9 g/dL (ref 1.5–4.5)
Glucose: 96 mg/dL (ref 70–99)
Potassium: 4.4 mmol/L (ref 3.5–5.2)
Sodium: 137 mmol/L (ref 134–144)
Total Protein: 6.7 g/dL (ref 6.0–8.5)
eGFR: 88 mL/min/{1.73_m2} (ref >59)

## 2021-10-24 LAB — MICROALBUMIN / CREATININE URINE RATIO
Creatinine, Urine: 21 mg/dL
Microalb/Creat Ratio: <14 mg/g creat (ref 0–29)
Microalbumin, Urine: <3 ug/mL

## 2021-10-24 LAB — LIPID PANEL
Chol/HDL Ratio: 2 ratio (ref 0.0–5.0)
Cholesterol, Total: 136 mg/dL (ref 100–199)
HDL: 69 mg/dL (ref >39)
LDL Chol Calc (NIH): 51 mg/dL (ref 0–99)
Triglycerides: 81 mg/dL (ref 0–149)
VLDL Cholesterol Cal: 16 mg/dL (ref 5–40)

## 2021-10-31 DIAGNOSIS — R6889 Other general symptoms and signs: Secondary | ICD-10-CM | POA: Diagnosis not present

## 2021-11-04 ENCOUNTER — Telehealth: Payer: Self-pay | Admitting: Physician Assistant

## 2021-11-04 DIAGNOSIS — D229 Melanocytic nevi, unspecified: Secondary | ICD-10-CM

## 2021-11-04 NOTE — Telephone Encounter (Signed)
New referal has been placed. AS, CMA

## 2021-11-04 NOTE — Telephone Encounter (Signed)
Requesting a new referral to dermatology. Was referred to [facility] and had an appt scheduled however they have closed the office. Please advise.

## 2021-11-06 ENCOUNTER — Ambulatory Visit: Payer: Medicare Other | Admitting: Physician Assistant

## 2021-11-07 DIAGNOSIS — R6889 Other general symptoms and signs: Secondary | ICD-10-CM | POA: Diagnosis not present

## 2021-11-09 ENCOUNTER — Encounter: Payer: Self-pay | Admitting: Emergency Medicine

## 2021-11-09 ENCOUNTER — Ambulatory Visit
Admission: EM | Admit: 2021-11-09 | Discharge: 2021-11-09 | Disposition: A | Discharge status: Home / Self Care | Payer: Medicare Other | Attending: Physician Assistant | Admitting: Physician Assistant

## 2021-11-09 DIAGNOSIS — S4992XA Unspecified injury of left shoulder and upper arm, initial encounter: Secondary | ICD-10-CM | POA: Diagnosis not present

## 2021-11-09 NOTE — ED Triage Notes (Addendum)
Pt said Wednesday he was moving boxes with a dolly and the dolly fell backwards and he fell into the wall and hit his left should er and arm. Thre is bruising and soreness. Able to move and place over his head. Pt is on Plavix

## 2021-11-09 NOTE — ED Provider Notes (Signed)
[facility]    CSN: [id number] Arrival date & time: 11/09/21  1258      History   Chief Complaint Chief Complaint  Patient presents with   Arm Injury    HPI Jacob Harrison is a 81 y.o. male.   Patient here today for evaluation of bruising to his left arm after he accidentally had a hand truck fall back on him while moving boxes 3 days ago.  He reports that when the hand truck fell onto his arm he was pushed back against a wall and hit the posterior aspect of his left shoulder on the wall.  He denies any significant pain in his shoulder but does have some mild pain with palpation of his left forearm and left posterior upper arm where bruising is present.  He does not feel that he broke any bones and is able to move all joints without any pain.  He denies any numbness or tingling.  The history is provided by the patient.    Past Medical History:  Diagnosis Date   (HFpEF) heart failure with preserved ejection fraction ([place])    a. 10/2016 Echo: EF 65-70%, mild LVH, mildly dil LA. RV fxn nl.   Arthritis    BPH (benign prostatic hyperplasia)    Coronary artery disease    a. 2001 CABG x 3 [facility]): LIMA->LAD, VG->LCX, VG->RPDA; b. 04/05/2010 Cath ([place]): LM 36m LAD 1070mLCX 4023mCA 100p, LIMA->LAD atretic, VG->LCX 100, VG->RCA 80ost, 70d; c. 04/2010 Redo CABG x 2 ([place]): VG->LAD, VG->RPDA; d. 10/2012 Cath (FrSharlene MottsLM 20-30, LAD 100/95-52m76mX 40-67m,40m 100p, 80-100d, RPDA 99, RPL1 95, VG->LAD ok, VG->RPDA ok, EF 67%; e. 11/2016 MV: EF 48%, No ischemia.   Diabetes mellitus without complication (HCC) [place] II   Hyperlipidemia    Hypertension    Ischemic cardiomyopathy    a. 03/2021 s/p MDT Visia AF MRI VR SureScan single lead AICD (ser# [id number]  Scoliosis    Spinal stenosis     Patient Active Problem List   Diagnosis Date Noted   Acute on chronic heart failure with preserved ejection fraction (HFpEF) (HCC) [place]04/2022   Non-ST elevation  (NSTEMI) myocardial infarction (HCC) [place]04/2022   Hypokalemia due to loss of potassium 10/08/2020   Coronary artery disease 10/04/2020   CHF (congestive heart failure) (HCC) [place]30/2022   Hx of CABG    Unstable angina (HCC) [place]29/2022   Chest pain 09/05/2020   Loss of balance 06/14/2020   Rotator cuff disorder 08/09/2019   Sinusitis 04/27/2019   BPH associated with nocturia 11/29/2018   Status post lumbar spine surgery for decompression of spinal cord 10/29/2018   Encounter for Medicare annual wellness exam 08/17/2018   Spinal stenosis of lumbar region 08/02/2018   Flu-like symptoms 05/05/2018   Bacterial conjunctivitis of both eyes 04/27/2018   Atypical mole 07/30/2017   Healthcare maintenance 07/30/2017   Wheezing 07/21/2017   Acute bronchitis 07/21/2017   Neck pain 06/08/2017   Sinus bradycardia 03/18/2017   Screening for colon cancer 02/25/2017   Ventral hernia without obstruction or gangrene 02/25/2017   (HFpEF) heart failure with preserved ejection fraction (HCC) [place]24/2018   Ischemic cardiomyopathy 09/28/2016   Cough in adult 07/22/2016   Vitamin D deficiency 06/27/2016   Vitamin B deficiency 06/27/2016   Mixed hyperlipidemia 06/21/2016   Weakness of both lower extremities 06/21/2016   Type 2 diabetes mellitus without complication, without long-term current use of insulin (HCC) [place]13/2018   Essential hypertension 05/20/2016  Hyperlipidemia associated with type 2 diabetes mellitus ([place]) 05/20/2016   Coronary artery disease of native artery of native heart with stable angina pectoris ([place]) 05/20/2016    Past Surgical History:  Procedure Laterality Date   CARDIAC CATHETERIZATION     CORONARY ARTERY BYPASS GRAFT     x 2 (2001 and redo in late 2011 or early 2012)   HERNIA REPAIR Left    inguinal   ICD IMPLANT N/A 03/20/2021   Procedure: ICD IMPLANT;  Surgeon: Vickie Epley, MD;  Location: [place] [facility];  Service: Cardiovascular;  Laterality: N/A;   LEFT  HEART CATH AND CORS/GRAFTS ANGIOGRAPHY N/A 10/04/2020   Procedure: LEFT HEART CATH AND CORS/GRAFTS ANGIOGRAPHY;  Surgeon: Leonie Man, MD;  Location: [place] [facility];  Service: Cardiovascular;  Laterality: N/A;   LUMBAR LAMINECTOMY/DECOMPRESSION MICRODISCECTOMY N/A 10/20/2018   Procedure: L3-4, L4-5 LUMBAR DECOMPRESSION with dural repair.;  Surgeon: Marybelle Killings, MD;  Location: [place];  Service: Orthopedics;  Laterality: N/A;   TONSILLECTOMY AND ADENOIDECTOMY     TRANSURETHRAL RESECTION OF PROSTATE  1990       Home Medications    Prior to Admission medications   Medication Sig Start Date End Date Taking? Authorizing Provider  acetaminophen (TYLENOL) 500 MG tablet Take 1,000 mg by mouth every 6 (six) hours as needed for moderate pain or headache.    [provider]  aspirin EC 81 MG tablet Take 1 tablet (81 mg total) by mouth daily. 09/26/16   End, Harrell Gave, MD  carvedilol (COREG) 12.5 MG tablet TAKE 1 TABLET (12.'5MG'$  TOTAL) BY MOUTH TWICE A DAY WITH MEALS 09/16/21   End, Harrell Gave, MD  Cholecalciferol (VITAMIN D-3 PO) Take 800 Units by mouth daily.    [provider]  clopidogrel (PLAVIX) 75 MG tablet Take 1 tablet (75 mg total) by mouth daily. 06/13/21   Furth, Cadence H, PA-C  Coenzyme Q10 (CO Q-10) 200 MG CAPS Take 200 mg by mouth daily.     [provider]  cyanocobalamin 500 MCG tablet Take 500 mcg by mouth daily.    [provider]  diclofenac Sodium (VOLTAREN) 1 % GEL Apply 2 g topically 2 (two) times daily as needed. 02/22/19   Danford, Valetta Fuller D, NP  ezetimibe (ZETIA) 10 MG tablet TAKE 1 TABLET BY MOUTH EVERY DAY 06/13/21   [name], PA-C  finasteride (PROSCAR) 5 MG tablet TAKE 1 TABLET BY MOUTH EVERY DAY 06/13/21   Abonza, Maritza, PA-C  furosemide (LASIX) 40 MG tablet Take 1 tablet (40 mg total) by mouth daily. 10/22/21   End, Harrell Gave, MD  Glucosamine-Chondroit-Vit C-Mn (GLUCOSAMINE 1500 COMPLEX) CAPS Take 1 capsule by mouth daily.     [provider]  isosorbide mononitrate (IMDUR) 30 MG 24 hr tablet Take 30 mg (1 tablet) in the morning and 15 mg (1/2 tablet) in the evening. 05/28/21   Furth, Cadence H, PA-C  Lancets (ONETOUCH ULTRASOFT) lancets Use to check blood sugars fasting daily 02/03/18   Danford, Valetta Fuller D, NP  losartan (COZAAR) 25 MG tablet Take 0.5 tablets (12.5 mg total) by mouth daily. 09/04/21   Furth, Cadence H, PA-C  metFORMIN (GLUCOPHAGE) 500 MG tablet 1/2 tablet by mouth daily with food 09/04/20   Abonza, Maritza, PA-C  Multiple Vitamins-Minerals (PRESERVISION AREDS 2 PO) Take 1 capsule by mouth 2 (two) times a day.    [provider]  nitroGLYCERIN (NITROSTAT) 0.4 MG SL tablet PLACE 1 TABLET UNDER THE TONGUE EVERY 5 MINUTES AS NEEDED. 08/22/21  End, Harrell Gave, MD  ranolazine (RANEXA) 1000 MG SR tablet TAKE 1 TABLET BY MOUTH TWICE A DAY 09/30/21   End, Harrell Gave, MD  rosuvastatin (CRESTOR) 20 MG tablet TAKE 1 TABLET BY MOUTH EVERYDAY AT BEDTIME 06/13/21   [name], PA-C    Family History Family History  Problem Relation Age of Onset   Diabetes Mother    Hyperlipidemia Mother    Cancer Father        colon   Skin cancer Brother    Alcohol abuse Maternal Uncle    Diabetes Maternal Uncle    Hyperlipidemia Maternal Uncle    Cancer Paternal Uncle        colon    Social History Social History   Tobacco Use   Smoking status: Never   Smokeless tobacco: Never  Vaping Use   Vaping Use: Never used  Substance Use Topics   Alcohol use: Not Currently    Alcohol/week: 11.0 standard drinks of alcohol    Types: 5 Glasses of wine, 6 Cans of beer per week    Comment: 5-7 drinks a week (was 10-15 drinks/week)   Drug use: No     Allergies   Other and Tetracyclines & related   Review of Systems Review of Systems  Constitutional:  Negative for chills and fever.  Eyes:  Negative for discharge and redness.  Musculoskeletal:  Negative for arthralgias.  Skin:  Positive for color change.  Negative for wound.  Neurological:  Negative for numbness.     Physical Exam Triage Vital Signs ED Triage Vitals  Enc Vitals Group     BP      Pulse      Resp      Temp      Temp src      SpO2      Weight      Height      Head Circumference      Peak Flow      Pain Score      Pain Loc      Pain Edu?      Excl. in [place]?    No data found.  Updated Vital Signs BP (!) 97/58 (BP Location: Left Arm)   Pulse (!) 58   Temp 98.6 F (37 C) (Oral)   Resp 16   SpO2 96%      Physical Exam Vitals and nursing note reviewed.  Constitutional:      General: He is not in acute distress.    Appearance: Normal appearance. He is not ill-appearing.  HENT:     Head: Normocephalic and atraumatic.  Eyes:     Conjunctiva/sclera: Conjunctivae normal.  Cardiovascular:     Rate and Rhythm: Normal rate.  Pulmonary:     Effort: Pulmonary effort is normal.  Musculoskeletal:     Comments: Full ROM of left shoulder, elbow, wrist without pain  Skin:    Comments: Diffuse bruising to left upper and lower arm as well as posterior left shoulder.  Tenderness to palpation noted to palpable hematomas to left forearm and left posterior upper arm, no apparent boney abnormality  Neurological:     Mental Status: He is alert.  Psychiatric:        Mood and Affect: Mood normal.        Behavior: Behavior normal.        Thought Content: Thought content normal.      UC Treatments / Results  Labs (all labs ordered are listed, but only abnormal results  are displayed) Labs Reviewed - No data to display  EKG   Radiology No results found.  Procedures Procedures (including critical care time)  Medications Ordered in UC Medications - No data to display  Initial Impression / Assessment and Plan / UC Course  I have reviewed the triage vital signs and the nursing notes.  Pertinent labs & imaging results that were available during my care of the patient were reviewed by me and considered in my  medical decision making (see chart for details).    Suspect most likely bruising and hematoma is worse given patient is currently on anticoagulant therapy.  Ace bandage applied to hopefully help prevent further swelling and recommended further evaluation should symptoms fail to improve or worsen.  Final Clinical Impressions(s) / UC Diagnoses   Final diagnoses:  Injury of left upper arm, initial encounter   Discharge Instructions   None    ED Prescriptions   None    PDMP not reviewed this encounter.   Francene Finders, PA-C 11/09/21 1330

## 2021-11-11 ENCOUNTER — Other Ambulatory Visit: Payer: Self-pay

## 2021-11-11 ENCOUNTER — Encounter: Payer: Self-pay | Admitting: Physical Therapy

## 2021-11-11 ENCOUNTER — Ambulatory Visit: Payer: Medicare Other | Attending: Physician Assistant | Admitting: Physical Therapy

## 2021-11-11 DIAGNOSIS — R2689 Other abnormalities of gait and mobility: Secondary | ICD-10-CM | POA: Insufficient documentation

## 2021-11-11 DIAGNOSIS — M6281 Muscle weakness (generalized): Secondary | ICD-10-CM | POA: Insufficient documentation

## 2021-11-11 DIAGNOSIS — R2681 Unsteadiness on feet: Secondary | ICD-10-CM | POA: Diagnosis not present

## 2021-11-11 NOTE — Patient Instructions (Signed)
Access Code: [email or web address] URL: [email or web address] Date: 11/11/2021 Prepared by: Hilda Blades  Exercises - Standing Tandem Balance with Counter Support  - 2 x daily - 7 x weekly - 2 reps - 30 seconds hold - Standing Single Leg Stance with Counter Support  - 2 x daily - 7 x weekly - 3 reps - 15 seconds hold - Sit to Stand Without Arm Support  - 2 x daily - 7 x weekly - 2 sets - 10 reps - Standing Hip Abduction with Resistance at Thighs  - 2 x daily - 7 x weekly - 2 sets - 10 reps

## 2021-11-11 NOTE — Therapy (Signed)
OUTPATIENT PHYSICAL THERAPY EVALUATION   Patient Name: Jacob Harrison MRN: [id number] DOB:[birth year], 81 y.o., male Today's Date: 11/11/2021   PT End of Session - 11/11/21 1211     Visit Number 1    Number of Visits 9    Date for PT Re-Evaluation 01/06/22    Authorization Type UHC MCR    Authorization Time Period FOTO by 6th, KX by 15th    Progress Note Due on Visit 10    PT Start Time 1045    PT Stop Time 1130    PT Time Calculation (min) 45 min    Activity Tolerance Patient tolerated treatment well    Behavior During Therapy WFL for tasks assessed/performed             Past Medical History:  Diagnosis Date   (HFpEF) heart failure with preserved ejection fraction ([place])    a. 10/2016 Echo: EF 65-70%, mild LVH, mildly dil LA. RV fxn nl.   Arthritis    BPH (benign prostatic hyperplasia)    Coronary artery disease    a. 2001 CABG x 3 [facility]): LIMA->LAD, VG->LCX, VG->RPDA; b. 04/05/2010 Cath ([place]): LM 36m LAD 1034mLCX 4069mCA 100p, LIMA->LAD atretic, VG->LCX 100, VG->RCA 80ost, 70d; c. 04/2010 Redo CABG x 2 ([place]): VG->LAD, VG->RPDA; d. 10/2012 Cath (FrSharlene MottsLM 20-30, LAD 100/95-65m85mX 40-87m,70m 100p, 80-100d, RPDA 99, RPL1 95, VG->LAD ok, VG->RPDA ok, EF 67%; e. 11/2016 MV: EF 48%, No ischemia.   Diabetes mellitus without complication (HCC) [place] II   Hyperlipidemia    Hypertension    Ischemic cardiomyopathy    a. 03/2021 s/p MDT Visia AF MRI VR SureScan single lead AICD (ser# [id number]  Scoliosis    Spinal stenosis    Past Surgical History:  Procedure Laterality Date   CARDIAC CATHETERIZATION     CORONARY ARTERY BYPASS GRAFT     x 2 (2001 and redo in late 2011 or early 2012)   HERNIA REPAIR Left    inguinal   ICD IMPLANT N/A 03/20/2021   Procedure: ICD IMPLANT;  Surgeon: LambeVickie Epley  [facility];  Service: Cardiovascular;  Laterality: N/A;   LEFT HEART CATH AND CORS/GRAFTS ANGIOGRAPHY N/A 10/04/2020    Procedure: LEFT HEART CATH AND CORS/GRAFTS ANGIOGRAPHY;  Surgeon: HardiLeonie Man  [facility];  Service: Cardiovascular;  Laterality: N/A;   LUMBAR LAMINECTOMY/DECOMPRESSION MICRODISCECTOMY N/A 10/20/2018   Procedure: L3-4, L4-5 LUMBAR DECOMPRESSION with dural repair.;  Surgeon: YatesMarybelle Killings  [facility];  Laterality: N/A;   TONSILLECTOMY AND ADENOIDECTOMY     TRANSURETHRAL RESECTION OF PROSTATE  1990   Patient Active Problem List   Diagnosis Date Noted   Acute on chronic heart failure with preserved ejection fraction (HFpEF) (HCC) [place]04/2022   Non-ST elevation (NSTEMI) myocardial infarction (HCC) [place]04/2022   Hypokalemia due to loss of potassium 10/08/2020   Coronary artery disease 10/04/2020   CHF (congestive heart failure) (HCC) [place]30/2022   Hx of CABG    Unstable angina (HCC) [place]29/2022   Chest pain 09/05/2020   Loss of balance 06/14/2020   Rotator cuff disorder 08/09/2019   Sinusitis 04/27/2019   BPH associated with nocturia 11/29/2018   Status post lumbar spine surgery for decompression of spinal cord 10/29/2018   Encounter for Medicare annual wellness exam 08/17/2018   Spinal stenosis of lumbar region 08/02/2018   Flu-like symptoms 05/05/2018   Bacterial conjunctivitis of both eyes  04/27/2018   Atypical mole 07/30/2017   Healthcare maintenance 07/30/2017   Wheezing 07/21/2017   Acute bronchitis 07/21/2017   Neck pain 06/08/2017   Sinus bradycardia 03/18/2017   Screening for colon cancer 02/25/2017   Ventral hernia without obstruction or gangrene 02/25/2017   (HFpEF) heart failure with preserved ejection fraction ([place]) 09/28/2016   Ischemic cardiomyopathy 09/28/2016   Cough in adult 07/22/2016   Vitamin D deficiency 06/27/2016   Vitamin B deficiency 06/27/2016   Mixed hyperlipidemia 06/21/2016   Weakness of both lower extremities 06/21/2016   Type 2 diabetes mellitus without complication, without long-term current  use of insulin ([place]) 05/20/2016   Essential hypertension 05/20/2016   Hyperlipidemia associated with type 2 diabetes mellitus ([place]) 05/20/2016   Coronary artery disease of native artery of native heart with stable angina pectoris ([place]) 05/20/2016    PCP: [name], PA-C  REFERRING PROVIDER: [name], PA-C  REFERRING DIAG: Unsteady gait  THERAPY DIAG:  Other abnormalities of gait and mobility  Muscle weakness (generalized)  Rationale for Evaluation and Treatment Rehabilitation  ONSET DATE: chronic, worsening over the past 6 months   SUBJECTIVE:  SUBJECTIVE STATEMENT: Patient reports his primary issue is balance, which started about a year ago. He feels like if he reaches up or bends forward then is feels like he will fall over. Then over the past 6 months he feels like his walking has become more erratic and his posture has been getting worse. He does perform exercises to help his balance that he was provided at cardiac rehab, but they don't seem to be helping too much. He does report using a cane if he is in unfamiliar areas or non-flat surfaces, and he has a rollator that he uses if he goes into an auditorium or something like that. He did have a heart attack about a year ago and he will sometimes get short of breath with activity.  PERTINENT HISTORY: MI June 2022, DM, peripheral neuropathy BLE,   PAIN:  Are you having pain? No  PRECAUTIONS: Fall  WEIGHT BEARING RESTRICTIONS No  FALLS:  Has patient fallen in last 6 months? No  LIVING ENVIRONMENT: Lives with: lives with their spouse Lives in: House/apartment Stairs: Yes: Internal: 10-11 steps; on left going up and External: 3 steps; on right going up Has following equipment at home: Single point cane and Walker - 4 wheeled  OCCUPATION: Retired  PLOF: Independent  PATIENT GOALS: Improve balance and walking   OBJECTIVE:  PATIENT SURVEYS:  FOTO 45% functional status  COGNITION: Overall cognitive  status: Within functional limits for tasks assessed     SENSATION: Patient reports numbness L > R foot related to diabetic neuropathy  EDEMA:  Patient does exhibit bilateral lower leg edema  MUSCLE LENGTH: Not assessed  POSTURE:  Rounded shoulders, forward head  PALPATION: Not assessed  LOWER EXTREMITY ROM:   Not assessed  LOWER EXTREMITY MMT:  MMT Right eval Left eval  Hip flexion 4 4  Hip extension 4- 4-  Hip abduction 4- 4-  Knee flexion 4 4  Knee extension 4+ 4+  Ankle dorsiflexion 5 5  Ankle plantarflexion 4 4   LOWER EXTREMITY SPECIAL TESTS:  Not assessed  FUNCTIONAL TESTS:  5 times sit to stand: 20 seconds Timed up and go (TUG): 17 seconds 6 minute walk test: 880 ft Berg Balance Scale: 36/56  BERG BALANCE TEST Sitting to Standing: 4.      Stands without using hands and stabilize independently Standing Unsupported: 3.  Stands 2 minutes with supervision Sitting Unsupported: 4.     Sits for 2 minutes independently Standing to Sitting: 4.     Sits safely with minimal use of hands Transfers: 4.     Transfers safely with minor use of hands Standing with eyes closed: 2.     Able to stand for 3 seconds Standing with feet together: 3.     Stands for 1 minute with supervision Reaching forward with outstretched arm: 3.     Reaches forward 5 inches Retrieving object from the floor: 3.     Able to pick up with supervision Turning to look behind: 2.     Turns sideways only, maintains balance Turning 360 degrees: 2.     Able to turn slowly, but safely Place alternate foot on stool: 1.     Completes >2 steps with minimal assist Standing with one foot in front: 0.     Loses balance while standing/stepping Standing on one foot: 1.     Holds <3 seconds Total Score: 36/56  GAIT: Distance walked: 880 ft Assistive device utilized: None Level of assistance: Complete Independence Comments: Decreased stride length, bilateral toe out with wide BOS, occasional shuffling  gait   TODAY'S TREATMENT: Tandem stance at counter 2 x 30 sec each SLS at counter 2 x 15 sec each Sit to stand without UE x 10 Standing hip abduction with red at knees 2 x 10 each   PATIENT EDUCATION:  Education details: Exam findings, POC, HEP Person educated: Patient Education method: Explanation, Demonstration, Tactile cues, Verbal cues, and Handouts Education comprehension: verbalized understanding, returned demonstration, verbal cues required, tactile cues required, and needs further education  HOME EXERCISE PROGRAM: Access Code: [email or web address]   ASSESSMENT: CLINICAL IMPRESSION: Patient is a 81 y.o. male who was seen today for physical therapy evaluation and treatment for impaired balance and unsteady gait.    OBJECTIVE IMPAIRMENTS Abnormal gait, decreased activity tolerance, decreased balance, decreased strength, and postural dysfunction.   ACTIVITY LIMITATIONS lifting, standing, stairs, and locomotion level  PARTICIPATION LIMITATIONS: meal prep, cleaning, community activity, and yard work  PERSONAL FACTORS Age, Fitness, Past/current experiences, Time since onset of injury/illness/exacerbation, and 3+ comorbidities: see above  are also affecting patient's functional outcome.   REHAB POTENTIAL: Good  CLINICAL DECISION MAKING: Stable/uncomplicated  EVALUATION COMPLEXITY: Low   GOALS: Goals reviewed with patient? Yes  SHORT TERM GOALS: Target date: 12/09/2021   Patient will be I with initial HEP in order to progress with therapy. Baseline: HEP provided at eval Goal status: INITIAL  2.  PT will review FOTO with patient by 3rd visit in order to understand expected progress and outcome with therapy. Baseline: FOTO assessed at eval Goal status: INITIAL  3.  Patient will perform TUG </= 13 seconds to indicate improve mobility and reduced fall risk Baseline: 17 seconds Goal status: INITIAL  LONG TERM GOALS: Target date: 01/06/2022   Patient will be I with final HEP to  maintain progress from PT. Baseline: HEP provided at eval Goal status: INITIAL  2.  Patient will report >/= 53% status on FOTO to indicate improved functional ability. Baseline: 45% Goal status: INITIAL  3.  Patient will demonstrate BERG >/= 45/56 in order to indicate improved balance and reduced fall risk. Baseline: 36/56 Goal status: INITIAL  4.  Patient will perform 6MWT in >/= 1410 ft in order to indicate improved community access  Baseline: 880 ft Goal status: INITIAL  5.  Patient will exhibit 5xSTS in </= 12 sec  in order indicate improved LE strength and reduced fall risk Baseline: 20 sec Goal status: INITIAL   PLAN: PT FREQUENCY: 1x/week  PT DURATION: 8 weeks  PLANNED INTERVENTIONS: Therapeutic exercises, Therapeutic activity, Neuromuscular re-education, Balance training, Gait training, Patient/Family education, Self Care, Joint mobilization, Joint manipulation, Aquatic Therapy, Dry Needling, Cryotherapy, Moist heat, Manual therapy, and Re-evaluation  PLAN FOR NEXT SESSION: Review HEP and progress PRN, progress LE strengthening, balance training (unstable surface, reaching outside base of support, eyes closed), gait and step training   Hilda Blades, PT, DPT, LAT, ATC 11/11/21  12:48 PM Phone: [phone] Fax: [phone]

## 2021-11-18 NOTE — Therapy (Signed)
OUTPATIENT PHYSICAL THERAPY TREATMENT NOTE   Patient Name: Jacob Harrison MRN: [id number] DOB:09-14-1940, 81 y.o., male Today's Date: 11/19/2021  PCP: [name], PA-C   REFERRING PROVIDER: [name], PA-C   END OF SESSION:   PT End of Session - 11/19/21 0914     Visit Number 2    Number of Visits 9    Date for PT Re-Evaluation 01/06/22    Authorization Type UHC MCR    Authorization Time Period FOTO by 6th, KX by 15th    Progress Note Due on Visit 10    PT Start Time 0910    PT Stop Time 0950    PT Time Calculation (min) 40 min    Activity Tolerance Patient tolerated treatment well    Behavior During Therapy [facility] for tasks assessed/performed             Past Medical History:  Diagnosis Date   (HFpEF) heart failure with preserved ejection fraction ([place])    a. 10/2016 Echo: EF 65-70%, mild LVH, mildly dil LA. RV fxn nl.   Arthritis    BPH (benign prostatic hyperplasia)    Coronary artery disease    a. 2001 CABG x 3 [facility]): LIMA->LAD, VG->LCX, VG->RPDA; b. 04/05/2010 Cath ([place]): LM 30m LAD 1052mLCX 4056mCA 100p, LIMA->LAD atretic, VG->LCX 100, VG->RCA 80ost, 70d; c. 04/2010 Redo CABG x 2 ([place]): VG->LAD, VG->RPDA; d. 10/2012 Cath (FrSharlene MottsLM 20-30, LAD 100/95-57m76mX 40-12m,60m 100p, 80-100d, RPDA 99, RPL1 95, VG->LAD ok, VG->RPDA ok, EF 67%; e. 11/2016 MV: EF 48%, No ischemia.   Diabetes mellitus without complication (HCC) [place]   Hyperlipidemia    Hypertension    Ischemic cardiomyopathy    a. 03/2021 s/p MDT Visia AF MRI VR SureScan single lead AICD (ser# [id number]  Scoliosis    Spinal stenosis    Past Surgical History:  Procedure Laterality Date   CARDIAC CATHETERIZATION     CORONARY ARTERY BYPASS GRAFT     x 2 (2001 and redo in late 2011 or early 2012)   HERNIA REPAIR Left    inguinal   ICD IMPLANT N/A 03/20/2021   Procedure: ICD IMPLANT;  Surgeon: LambeVickie Epley  [facility]  Service:  Cardiovascular;  Laterality: N/A;   LEFT HEART CATH AND CORS/GRAFTS ANGIOGRAPHY N/A 10/04/2020   Procedure: LEFT HEART CATH AND CORS/GRAFTS ANGIOGRAPHY;  Surgeon: HardiLeonie Man  [facility] [place];  Service: Cardiovascular;  Laterality: N/A;   LUMBAR LAMINECTOMY/DECOMPRESSION MICRODISCECTOMY N/A 10/20/2018   Procedure: L3-4, L4-5 LUMBAR DECOMPRESSION with dural repair.;  Surgeon: YatesMarybelle Killings  [facility] [place];  Laterality: N/A;   TONSILLECTOMY AND ADENOIDECTOMY     TRANSURETHRAL RESECTION OF PROSTATE  1990   Patient Active Problem List   Diagnosis Date Noted   Acute on chronic heart failure with preserved ejection fraction (HFpEF) (HCC) [place]04/2022   Non-ST elevation (NSTEMI) myocardial infarction (HCC) [place]04/2022   Hypokalemia due to loss of potassium 10/08/2020   Coronary artery disease 10/04/2020   CHF (congestive heart failure) (HCC) [place]30/2022   Hx of CABG    Unstable angina (HCC) [place]29/2022   Chest pain 09/05/2020   Loss of balance 06/14/2020   Rotator cuff disorder 08/09/2019   Sinusitis 04/27/2019   BPH associated with nocturia 11/29/2018   Status post lumbar spine surgery for decompression of spinal cord 10/29/2018   Encounter for Medicare annual wellness exam 08/17/2018  Spinal stenosis of lumbar region 08/02/2018   Flu-like symptoms 05/05/2018   Bacterial conjunctivitis of both eyes 04/27/2018   Atypical mole 07/30/2017   Healthcare maintenance 07/30/2017   Wheezing 07/21/2017   Acute bronchitis 07/21/2017   Neck pain 06/08/2017   Sinus bradycardia 03/18/2017   Screening for colon cancer 02/25/2017   Ventral hernia without obstruction or gangrene 02/25/2017   (HFpEF) heart failure with preserved ejection fraction ([place]) 09/28/2016   Ischemic cardiomyopathy 09/28/2016   Cough in adult 07/22/2016   Vitamin D deficiency 06/27/2016   Vitamin B deficiency 06/27/2016   Mixed hyperlipidemia 06/21/2016   Weakness of both lower  extremities 06/21/2016   Type 2 diabetes mellitus without complication, without long-term current use of insulin ([place]) 05/20/2016   Essential hypertension 05/20/2016   Hyperlipidemia associated with type 2 diabetes mellitus ([place]) 05/20/2016   Coronary artery disease of native artery of native heart with stable angina pectoris ([place]) 05/20/2016    REFERRING DIAG: Unsteady gait  THERAPY DIAG:  Other abnormalities of gait and mobility  Muscle weakness (generalized)  Rationale for Evaluation and Treatment Rehabilitation  PERTINENT HISTORY: MI June 2022, DM, peripheral neuropathy BLE  PRECAUTIONS: Fall  SUBJECTIVE: Patient reports he is doing well, he did have some soreness with the exercises so had to do one set per day for a few days.  PAIN:  Are you having pain? No   OBJECTIVE: (objective measures completed at initial evaluation unless otherwise dated) PATIENT SURVEYS:  FOTO 45% functional status   SENSATION: Patient reports numbness L > R foot related to diabetic neuropathy   EDEMA:  Patient does exhibit bilateral lower leg edema   POSTURE:  Rounded shoulders, forward head   LOWER EXTREMITY MMT:   MMT Right eval Left eval  Hip flexion 4 4  Hip extension 4- 4-  Hip abduction 4- 4-  Knee flexion 4 4  Knee extension 4+ 4+  Ankle dorsiflexion 5 5  Ankle plantarflexion 4 4   FUNCTIONAL TESTS:  5 times sit to stand: 20 seconds Timed up and go (TUG): 17 seconds 6 minute walk test: 880 ft Berg Balance Scale: 36/56   GAIT: Distance walked: 880 ft Assistive device utilized: None Level of assistance: Complete Independence Comments: Decreased stride length, bilateral toe out with wide BOS, occasional shuffling gait     TODAY'S TREATMENT: [facility] Adult PT Treatment:                                                DATE: 11/19/2021 Therapeutic Exercise: NuStep L5 x 5 min with UE/LE while taking subjective SLR 2 x 10 each Bridge 2 x 10 Sit to stand without UE 2 x  10 Sidestepping in // bars with red at knees 3 x 2 lengths Forward step-up 6" x 10 each Lateral step-up and over 6" x 12 Neuro Reed: Tandem stance at counter 2 x 30 sec each Rockerboard fwd/bwd taps 2 x 1 min Romberg on Airex with head turns and nods 2 x 5 each SLS at counter 2 x 15 sec each   [facility] Adult PT Treatment:  DATE: 11/11/2021 Therapeutic Exercise: Tandem stance at counter 2 x 30 sec each SLS at counter 2 x 15 sec each Sit to stand without UE x 10 Standing hip abduction with red at knees 2 x 10 each   PATIENT EDUCATION:  Education details: HEP update Person educated: Patient Education method: Explanation, Demonstration, Tactile cues, Verbal cues, and Handouts Education comprehension: verbalized understanding, returned demonstration, verbal cues required, tactile cues required, and needs further education   HOME EXERCISE PROGRAM: Access Code: [email or web address]     ASSESSMENT: CLINICAL IMPRESSION: Patient tolerated therapy well with no adverse effects. Therapy focused on progression of strength and balance training. He was able to incorporate unstable surface and did require occasional UE support to maintain balance. Patient did require occasional extended rest breaks due to breathing heavy and self monitoring of heart rate. Updated HEP to progress balance at home with good tolerance. Patient would benefit from continued skilled PT to progress his strength and balance in order to improve walking ability and reduce fall risk.     OBJECTIVE IMPAIRMENTS Abnormal gait, decreased activity tolerance, decreased balance, decreased strength, and postural dysfunction.    ACTIVITY LIMITATIONS lifting, standing, stairs, and locomotion level   PARTICIPATION LIMITATIONS: meal prep, cleaning, community activity, and yard work   PERSONAL FACTORS Age, Fitness, Past/current experiences, Time since onset of injury/illness/exacerbation, and 3+  comorbidities: see above  are also affecting patient's functional outcome.      GOALS: Goals reviewed with patient? Yes   SHORT TERM GOALS: Target date: 12/09/2021    Patient will be I with initial HEP in order to progress with therapy. Baseline: HEP provided at eval Goal status: INITIAL   2.  PT will review FOTO with patient by 3rd visit in order to understand expected progress and outcome with therapy. Baseline: FOTO assessed at eval Goal status: INITIAL   3.  Patient will perform TUG </= 13 seconds to indicate improve mobility and reduced fall risk Baseline: 17 seconds Goal status: INITIAL   LONG TERM GOALS: Target date: 01/06/2022    Patient will be I with final HEP to maintain progress from PT. Baseline: HEP provided at eval Goal status: INITIAL   2.  Patient will report >/= 53% status on FOTO to indicate improved functional ability. Baseline: 45% Goal status: INITIAL   3.  Patient will demonstrate BERG >/= 45/56 in order to indicate improved balance and reduced fall risk. Baseline: 36/56 Goal status: INITIAL   4.  Patient will perform 6MWT in >/= 1410 ft in order to indicate improved community access  Baseline: 880 ft Goal status: INITIAL   5.  Patient will exhibit 5xSTS in </= 12 sec in order indicate improved LE strength and reduced fall risk Baseline: 20 sec Goal status: INITIAL     PLAN: PT FREQUENCY: 1x/week   PT DURATION: 8 weeks   PLANNED INTERVENTIONS: Therapeutic exercises, Therapeutic activity, Neuromuscular re-education, Balance training, Gait training, Patient/Family education, Self Care, Joint mobilization, Joint manipulation, Aquatic Therapy, Dry Needling, Cryotherapy, Moist heat, Manual therapy, and Re-evaluation   PLAN FOR NEXT SESSION: Review HEP and progress PRN, progress LE strengthening, balance training (unstable surface, reaching outside base of support, eyes closed), gait and step training     Hilda Blades, PT, DPT, LAT, ATC 11/19/21   9:59 AM Phone: [phone] Fax: [phone]

## 2021-11-19 ENCOUNTER — Encounter: Payer: Self-pay | Admitting: Physical Therapy

## 2021-11-19 ENCOUNTER — Ambulatory Visit: Payer: Medicare Other | Admitting: Physical Therapy

## 2021-11-19 ENCOUNTER — Other Ambulatory Visit: Payer: Self-pay

## 2021-11-19 DIAGNOSIS — R2681 Unsteadiness on feet: Secondary | ICD-10-CM | POA: Diagnosis not present

## 2021-11-19 DIAGNOSIS — R2689 Other abnormalities of gait and mobility: Secondary | ICD-10-CM | POA: Diagnosis not present

## 2021-11-19 DIAGNOSIS — M6281 Muscle weakness (generalized): Secondary | ICD-10-CM

## 2021-11-19 NOTE — Patient Instructions (Signed)
Access Code: [email or web address] URL: [email or web address] Date: 11/19/2021 Prepared by: Hilda Blades  Exercises - Standing Tandem Balance with Counter Support  - 2 x daily - 7 x weekly - 2 reps - 30 seconds hold - Standing Single Leg Stance with Counter Support  - 2 x daily - 7 x weekly - 3 reps - 15 seconds hold - Romberg Stance with Head Nods  - 2 x daily - 7 x weekly - 2 sets - 5 reps - Romberg Stance with Head Rotation  - 2 x daily - 7 x weekly - 2 sets - 5 reps - Sit to Stand Without Arm Support  - 2 x daily - 7 x weekly - 2 sets - 10 reps - Standing Hip Abduction with Resistance at Thighs  - 2 x daily - 7 x weekly - 2 sets - 10 reps

## 2021-11-20 ENCOUNTER — Encounter: Payer: Self-pay | Admitting: Physician Assistant

## 2021-11-25 NOTE — Therapy (Signed)
OUTPATIENT PHYSICAL THERAPY TREATMENT NOTE   Patient Name: Jacob Harrison MRN: [id number] DOB:1940/10/06, 81 y.o., male Today's Date: 11/26/2021  PCP: [name], PA-C   REFERRING PROVIDER: [name], PA-C   END OF SESSION:   PT End of Session - 11/26/21 0908     Visit Number 3    Number of Visits 9    Date for PT Re-Evaluation 01/06/22    Authorization Type UHC MCR    Authorization Time Period FOTO by 6th, KX by 15th    Progress Note Due on Visit 10    PT Start Time 0910    PT Stop Time 0950    PT Time Calculation (min) 40 min    Activity Tolerance Patient tolerated treatment well    Behavior During Therapy [facility] for tasks assessed/performed              Past Medical History:  Diagnosis Date   (HFpEF) heart failure with preserved ejection fraction ([place])    a. 10/2016 Echo: EF 65-70%, mild LVH, mildly dil LA. RV fxn nl.   Arthritis    BPH (benign prostatic hyperplasia)    Coronary artery disease    a. 2001 CABG x 3 [facility]): LIMA->LAD, VG->LCX, VG->RPDA; b. 04/05/2010 Cath ([place]): LM 22m LAD 1074mLCX 4067mCA 100p, LIMA->LAD atretic, VG->LCX 100, VG->RCA 80ost, 70d; c. 04/2010 Redo CABG x 2 ([place]): VG->LAD, VG->RPDA; d. 10/2012 Cath (FrSharlene MottsLM 20-30, LAD 100/95-33m76mX 40-23m,44m 100p, 80-100d, RPDA 99, RPL1 95, VG->LAD ok, VG->RPDA ok, EF 67%; e. 11/2016 MV: EF 48%, No ischemia.   Diabetes mellitus without complication (HCC) [place]   Hyperlipidemia    Hypertension    Ischemic cardiomyopathy    a. 03/2021 s/p MDT Visia AF MRI VR SureScan single lead AICD (ser# [id number]  Scoliosis    Spinal stenosis    Past Surgical History:  Procedure Laterality Date   CARDIAC CATHETERIZATION     CORONARY ARTERY BYPASS GRAFT     x 2 (2001 and redo in late 2011 or early 2012)   HERNIA REPAIR Left    inguinal   ICD IMPLANT N/A 03/20/2021   Procedure: ICD IMPLANT;  Surgeon: LambeVickie Epley  [facility];  Service:  Cardiovascular;  Laterality: N/A;   LEFT HEART CATH AND CORS/GRAFTS ANGIOGRAPHY N/A 10/04/2020   Procedure: LEFT HEART CATH AND CORS/GRAFTS ANGIOGRAPHY;  Surgeon: HardiLeonie Man  [facility] [place];  Service: Cardiovascular;  Laterality: N/A;   LUMBAR LAMINECTOMY/DECOMPRESSION MICRODISCECTOMY N/A 10/20/2018   Procedure: L3-4, L4-5 LUMBAR DECOMPRESSION with dural repair.;  Surgeon: YatesMarybelle Killings  [facility] [place];  Laterality: N/A;   TONSILLECTOMY AND ADENOIDECTOMY     TRANSURETHRAL RESECTION OF PROSTATE  1990   Patient Active Problem List   Diagnosis Date Noted   Acute on chronic heart failure with preserved ejection fraction (HFpEF) (HCC) [place]04/2022   Non-ST elevation (NSTEMI) myocardial infarction (HCC) [place]04/2022   Hypokalemia due to loss of potassium 10/08/2020   Coronary artery disease 10/04/2020   CHF (congestive heart failure) (HCC) [place]30/2022   Hx of CABG    Unstable angina (HCC) [place]29/2022   Chest pain 09/05/2020   Loss of balance 06/14/2020   Rotator cuff disorder 08/09/2019   Sinusitis 04/27/2019   BPH associated with nocturia 11/29/2018   Status post lumbar spine surgery for decompression of spinal cord 10/29/2018   Encounter for Medicare annual wellness exam 08/17/2018  Spinal stenosis of lumbar region 08/02/2018   Flu-like symptoms 05/05/2018   Bacterial conjunctivitis of both eyes 04/27/2018   Atypical mole 07/30/2017   Healthcare maintenance 07/30/2017   Wheezing 07/21/2017   Acute bronchitis 07/21/2017   Neck pain 06/08/2017   Sinus bradycardia 03/18/2017   Screening for colon cancer 02/25/2017   Ventral hernia without obstruction or gangrene 02/25/2017   (HFpEF) heart failure with preserved ejection fraction ([place]) 09/28/2016   Ischemic cardiomyopathy 09/28/2016   Cough in adult 07/22/2016   Vitamin D deficiency 06/27/2016   Vitamin B deficiency 06/27/2016   Mixed hyperlipidemia 06/21/2016   Weakness of both lower  extremities 06/21/2016   Type 2 diabetes mellitus without complication, without long-term current use of insulin ([place]) 05/20/2016   Essential hypertension 05/20/2016   Hyperlipidemia associated with type 2 diabetes mellitus ([place]) 05/20/2016   Coronary artery disease of native artery of native heart with stable angina pectoris ([place]) 05/20/2016    REFERRING DIAG: Unsteady gait  THERAPY DIAG:  Other abnormalities of gait and mobility  Muscle weakness (generalized)  Rationale for Evaluation and Treatment Rehabilitation  PERTINENT HISTORY: MI June 2022, DM, peripheral neuropathy BLE  PRECAUTIONS: Fall  SUBJECTIVE: Patient reports he is doing well, no new issues. He states he was a little sore following last visit but recovered over the next day or two.  PAIN:  Are you having pain? No   OBJECTIVE: (objective measures completed at initial evaluation unless otherwise dated) PATIENT SURVEYS:  FOTO 45% functional status   SENSATION: Patient reports numbness L > R foot related to diabetic neuropathy   EDEMA:  Patient does exhibit bilateral lower leg edema   POSTURE:  Rounded shoulders, forward head   LOWER EXTREMITY MMT:   MMT Right eval Left eval  Hip flexion 4 4  Hip extension 4- 4-  Hip abduction 4- 4-  Knee flexion 4 4  Knee extension 4+ 4+  Ankle dorsiflexion 5 5  Ankle plantarflexion 4 4   FUNCTIONAL TESTS:  5 times sit to stand: 20 seconds Timed up and go (TUG): 17 seconds  11/26/2021: 14 seconds 6 minute walk test: 880 ft Berg Balance Scale: 36/56   GAIT: Distance walked: 880 ft Assistive device utilized: None Level of assistance: Complete Independence Comments: Decreased stride length, bilateral toe out with wide BOS, occasional shuffling gait     TODAY'S TREATMENT: [facility] Adult PT Treatment:                                                DATE: 11/26/2021 Therapeutic Exercise: NuStep L6 x 5 min with UE/LE while taking subjective Leg press (BATCA) 45# 3  x 10 Row with FM 20# 3 x 10 Standing hip abduction with green at knees 2 x 10 each Standing heel toe raises 2 x 20 Sit to stand holding 5# at chest 2 x 10 Forward step-up 6" 2 x 10 each Neuro Reed: Rockerboard fwd/bwd taps 2 x 2 min Rockerboard lateral taps 2 x 2 min Romberg on Airex with head turns and nods x 20 each each   [facility] Adult PT Treatment:  DATE: 11/19/2021 Therapeutic Exercise: NuStep L5 x 5 min with UE/LE while taking subjective SLR 2 x 10 each Bridge 2 x 10 Sit to stand without UE 2 x 10 Sidestepping in // bars with red at knees 3 x 2 lengths Forward step-up 6" x 10 each Lateral step-up and over 6" x 12 Neuro Reed: Tandem stance at counter 2 x 30 sec each Rockerboard fwd/bwd taps 2 x 1 min Romberg on Airex with head turns and nods 2 x 5 each SLS at counter 2 x 15 sec each  [facility] Adult PT Treatment:                                                DATE: 11/11/2021 Therapeutic Exercise: Tandem stance at counter 2 x 30 sec each SLS at counter 2 x 15 sec each Sit to stand without UE x 10 Standing hip abduction with red at knees 2 x 10 each   PATIENT EDUCATION:  Education details: HEP update Person educated: Patient Education method: Explanation, Demonstration, Tactile cues, Verbal cues, and Handouts Education comprehension: verbalized understanding, returned demonstration, verbal cues required, tactile cues required, and needs further education   HOME EXERCISE PROGRAM: Access Code: [email or web address]     ASSESSMENT: CLINICAL IMPRESSION: Patient tolerated therapy well with no adverse effects. Therapy continued to focus on progressing strength and balance training, and incorporated postural strengthening this visit. He is progressing well with his LE strengthening and only require minimal use of UE for support with balance training. No reports of shortness of breath or need for extended rest breaks this visit. Updated HEP to include  banded row for posture. Patient would benefit from continued skilled PT to progress his strength and balance in order to improve walking ability and reduce fall risk.     OBJECTIVE IMPAIRMENTS Abnormal gait, decreased activity tolerance, decreased balance, decreased strength, and postural dysfunction.    ACTIVITY LIMITATIONS lifting, standing, stairs, and locomotion level   PARTICIPATION LIMITATIONS: meal prep, cleaning, community activity, and yard work   PERSONAL FACTORS Age, Fitness, Past/current experiences, Time since onset of injury/illness/exacerbation, and 3+ comorbidities: see above  are also affecting patient's functional outcome.      GOALS: Goals reviewed with patient? Yes   SHORT TERM GOALS: Target date: 12/09/2021    Patient will be I with initial HEP in order to progress with therapy. Baseline: HEP provided at eval Goal status: INITIAL   2.  PT will review FOTO with patient by 3rd visit in order to understand expected progress and outcome with therapy. Baseline: FOTO assessed at eval 11/26/2021: reviewed Goal status: MET   3.  Patient will perform TUG </= 13 seconds to indicate improve mobility and reduced fall risk Baseline: 17 seconds 11/26/2021: 14 seconds Goal status: PARTIALLY MET   LONG TERM GOALS: Target date: 01/06/2022    Patient will be I with final HEP to maintain progress from PT. Baseline: HEP provided at eval Goal status: INITIAL   2.  Patient will report >/= 53% status on FOTO to indicate improved functional ability. Baseline: 45% Goal status: INITIAL   3.  Patient will demonstrate BERG >/= 45/56 in order to indicate improved balance and reduced fall risk. Baseline: 36/56 Goal status: INITIAL   4.  Patient will perform 6MWT in >/= 1410 ft in order to indicate improved community access  Baseline: 880 ft  Goal status: INITIAL   5.  Patient will exhibit 5xSTS in </= 12 sec in order indicate improved LE strength and reduced fall risk Baseline: 20  sec Goal status: INITIAL     PLAN: PT FREQUENCY: 1x/week   PT DURATION: 8 weeks   PLANNED INTERVENTIONS: Therapeutic exercises, Therapeutic activity, Neuromuscular re-education, Balance training, Gait training, Patient/Family education, Self Care, Joint mobilization, Joint manipulation, Aquatic Therapy, Dry Needling, Cryotherapy, Moist heat, Manual therapy, and Re-evaluation   PLAN FOR NEXT SESSION: Review HEP and progress PRN, progress LE strengthening, balance training (unstable surface, reaching outside base of support, eyes closed), gait and step training     Hilda Blades, PT, DPT, LAT, ATC 11/26/21  9:54 AM Phone: [phone] Fax: [phone]

## 2021-11-26 ENCOUNTER — Encounter: Payer: Self-pay | Admitting: Physical Therapy

## 2021-11-26 ENCOUNTER — Other Ambulatory Visit: Payer: Self-pay

## 2021-11-26 ENCOUNTER — Ambulatory Visit: Payer: Medicare Other | Admitting: Physical Therapy

## 2021-11-26 DIAGNOSIS — R2689 Other abnormalities of gait and mobility: Secondary | ICD-10-CM | POA: Diagnosis not present

## 2021-11-26 DIAGNOSIS — M6281 Muscle weakness (generalized): Secondary | ICD-10-CM

## 2021-11-26 DIAGNOSIS — R2681 Unsteadiness on feet: Secondary | ICD-10-CM | POA: Diagnosis not present

## 2021-11-26 NOTE — Patient Instructions (Signed)
Access Code: [email or web address] URL: [email or web address] Date: 11/26/2021 Prepared by: Hilda Blades  Exercises - Standing Tandem Balance with Counter Support  - 2 x daily - 7 x weekly - 2 reps - 30 seconds hold - Standing Single Leg Stance with Counter Support  - 2 x daily - 7 x weekly - 3 reps - 15 seconds hold - Romberg Stance with Head Nods  - 2 x daily - 7 x weekly - 2 sets - 5 reps - Romberg Stance with Head Rotation  - 2 x daily - 7 x weekly - 2 sets - 5 reps - Sit to Stand Without Arm Support  - 2 x daily - 7 x weekly - 2 sets - 10 reps - Standing Hip Abduction with Resistance at Thighs  - 2 x daily - 7 x weekly - 2 sets - 10 reps - Standing Row with Anchored Resistance  - 2 x daily - 7 x weekly - 2 sets - 10 reps

## 2021-11-27 DIAGNOSIS — B078 Other viral warts: Secondary | ICD-10-CM | POA: Diagnosis not present

## 2021-11-30 ENCOUNTER — Other Ambulatory Visit: Payer: Self-pay | Admitting: Physician Assistant

## 2021-11-30 DIAGNOSIS — E118 Type 2 diabetes mellitus with unspecified complications: Secondary | ICD-10-CM

## 2021-12-03 ENCOUNTER — Ambulatory Visit: Payer: Medicare Other | Admitting: Physical Therapy

## 2021-12-03 ENCOUNTER — Encounter: Payer: Self-pay | Admitting: Physical Therapy

## 2021-12-03 DIAGNOSIS — M6281 Muscle weakness (generalized): Secondary | ICD-10-CM | POA: Diagnosis not present

## 2021-12-03 DIAGNOSIS — R2689 Other abnormalities of gait and mobility: Secondary | ICD-10-CM | POA: Diagnosis not present

## 2021-12-03 DIAGNOSIS — R2681 Unsteadiness on feet: Secondary | ICD-10-CM | POA: Diagnosis not present

## 2021-12-03 NOTE — Therapy (Signed)
OUTPATIENT PHYSICAL THERAPY TREATMENT NOTE   Patient Name: Jacob Jacob MRN: [id number] DOB:1940-06-25, 81 y.o., male Today's Date: 12/03/2021  PCP: [name], PA-C   REFERRING PROVIDER: [name], PA-C   END OF SESSION:   PT End of Session - 12/03/21 0928     Visit Number 4    Number of Visits 9    Date for PT Re-Evaluation 01/06/22    Authorization Type UHC MCR    Authorization Time Period FOTO by 6th, KX by 15th    Progress Note Due on Visit 10    PT Start Time 0930    PT Stop Time 1015    PT Time Calculation (min) 45 min              Past Medical History:  Diagnosis Date   (HFpEF) heart failure with preserved ejection fraction ([place])    a. 10/2016 Echo: EF 65-70%, mild LVH, mildly dil LA. RV fxn nl.   Arthritis    BPH (benign prostatic hyperplasia)    Coronary artery disease    a. 2001 CABG x 3 [facility]): LIMA->LAD, VG->LCX, VG->RPDA; b. 04/05/2010 Cath ([place]): LM 76m LAD 10102mLCX 4071mCA 100p, LIMA->LAD atretic, VG->LCX 100, VG->RCA 80ost, 70d; c. 04/2010 Redo CABG x 2 ([place]): VG->LAD, VG->RPDA; d. 10/2012 Cath (FrSharlene MottsLM 20-30, LAD 100/95-74m9mX 40-74m,56m 100p, 80-100d, RPDA 99, RPL1 95, VG->LAD ok, VG->RPDA ok, EF 67%; e. 11/2016 MV: EF 48%, No ischemia.   Diabetes mellitus without complication (HCC) [place]   Hyperlipidemia    Hypertension    Ischemic cardiomyopathy    a. 03/2021 s/p MDT Visia AF MRI VR SureScan single lead AICD (ser# [id number]  Scoliosis    Spinal stenosis    Past Surgical History:  Procedure Laterality Date   CARDIAC CATHETERIZATION     CORONARY ARTERY BYPASS GRAFT     x 2 (2001 and redo in late 2011 or early 2012)   HERNIA REPAIR Left    inguinal   ICD IMPLANT N/A 03/20/2021   Procedure: ICD IMPLANT;  Surgeon: LambeVickie Epley  [facility];  Service: Cardiovascular;  Laterality: N/A;   LEFT HEART CATH AND CORS/GRAFTS ANGIOGRAPHY N/A 10/04/2020   Procedure: LEFT HEART  CATH AND CORS/GRAFTS ANGIOGRAPHY;  Surgeon: HardiLeonie Man  [facility];  Service: Cardiovascular;  Laterality: N/A;   LUMBAR LAMINECTOMY/DECOMPRESSION MICRODISCECTOMY N/A 10/20/2018   Procedure: L3-4, L4-5 LUMBAR DECOMPRESSION with dural repair.;  Surgeon: YatesMarybelle Killings  [facility];  Laterality: N/A;   TONSILLECTOMY AND ADENOIDECTOMY     TRANSURETHRAL RESECTION OF PROSTATE  1990   Patient Active Problem List   Diagnosis Date Noted   Acute on chronic heart failure with preserved ejection fraction (HFpEF) (HCC) [place]04/2022   Non-ST elevation (NSTEMI) myocardial infarction (HCC) [place]04/2022   Hypokalemia due to loss of potassium 10/08/2020   Coronary artery disease 10/04/2020   CHF (congestive heart failure) (HCC) [place]30/2022   Hx of CABG    Unstable angina (HCC) [place]29/2022   Chest pain 09/05/2020   Loss of balance 06/14/2020   Rotator cuff disorder 08/09/2019   Sinusitis 04/27/2019   BPH associated with nocturia 11/29/2018   Status post lumbar spine surgery for decompression of spinal cord 10/29/2018   Encounter for Medicare annual wellness exam 08/17/2018   Spinal stenosis of lumbar region 08/02/2018   Flu-like symptoms 05/05/2018   Bacterial conjunctivitis of both eyes  04/27/2018   Atypical mole 07/30/2017   Healthcare maintenance 07/30/2017   Wheezing 07/21/2017   Acute bronchitis 07/21/2017   Neck pain 06/08/2017   Sinus bradycardia 03/18/2017   Screening for colon cancer 02/25/2017   Ventral hernia without obstruction or gangrene 02/25/2017   (HFpEF) heart failure with preserved ejection fraction ([place]) 09/28/2016   Ischemic cardiomyopathy 09/28/2016   Cough in adult 07/22/2016   Vitamin D deficiency 06/27/2016   Vitamin B deficiency 06/27/2016   Mixed hyperlipidemia 06/21/2016   Weakness of both lower extremities 06/21/2016   Type 2 diabetes mellitus without complication, without long-term current use of insulin ([place])  05/20/2016   Essential hypertension 05/20/2016   Hyperlipidemia associated with type 2 diabetes mellitus ([place]) 05/20/2016   Coronary artery disease of native artery of native heart with stable angina pectoris ([place]) 05/20/2016    REFERRING DIAG: Unsteady gait  THERAPY DIAG:  Other abnormalities of gait and mobility  Muscle weakness (generalized)  Rationale for Evaluation and Treatment Rehabilitation  PERTINENT HISTORY: MI June 2022, DM, peripheral neuropathy BLE  PRECAUTIONS: Fall  SUBJECTIVE: I still feel a little wobbly. I am doing better stationary balancing but when I am walking and turn a corner I feel unbalanced.   PAIN:  Are you having pain? No   OBJECTIVE: (objective measures completed at initial evaluation unless otherwise dated) PATIENT SURVEYS:  FOTO 45% functional status   SENSATION: Patient reports numbness L > R foot related to diabetic neuropathy   EDEMA:  Patient does exhibit bilateral lower leg edema   POSTURE:  Rounded shoulders, forward head   LOWER EXTREMITY MMT:   MMT Right eval Left eval  Hip flexion 4 4  Hip extension 4- 4-  Hip abduction 4- 4-  Knee flexion 4 4  Knee extension 4+ 4+  Ankle dorsiflexion 5 5  Ankle plantarflexion 4 4   FUNCTIONAL TESTS:  5 times sit to stand: 20 seconds Timed up and go (TUG): 17 seconds  11/26/2021: 14 seconds 6 minute walk test: 880 ft Berg Balance Scale: 36/56 12/03/21: DGI 17/24   GAIT: Distance walked: 880 ft Assistive device utilized: None Level of assistance: Complete Independence Comments: Decreased stride length, bilateral toe out with wide BOS, occasional shuffling gait     TODAY'S TREATMENT: [facility] Adult PT Treatment:                                                DATE: 12/03/21 Therapeutic Exercise: Nustep L5 UE/LE x 5 minutes Side stepping in // bars, no UE Backward gait in // bars, No UE Tandem gait in // bars- intermittent touch Step taps to 6 inch step - no UE  Step over hurdles  forward- intermittent touch  Side step over hurdles- intermittent touch Neuromuscular re-ed: Rockerboard fwd/bwd taps 2 x 2 min Rockerboard lateral taps 2 x 2 min Romberg on Airex with head turns and nods x 20 each each, trunk rotations  Therapeutic Activity: DGI: 17/24 (<19 fall risk) (>22 safe Ambulator)  Gait on level surface: 2 Change in gait speed:2 Gait with horiz head turns:2 Gait with vertical: 2 Gait and pivot: 3 Step over obstacle:2 Step around obstacles:2 Stairs: 2   [facility] Adult PT Treatment:  DATE: 11/26/2021 Therapeutic Exercise: NuStep L6 x 5 min with UE/LE while taking subjective Leg press (BATCA) 45# 3 x 10 Row with FM 20# 3 x 10 Standing hip abduction with green at knees 2 x 10 each Standing heel toe raises 2 x 20 Sit to stand holding 5# at chest 2 x 10 Forward step-up 6" 2 x 10 each Neuro Reed: Rockerboard fwd/bwd taps 2 x 2 min Rockerboard lateral taps 2 x 2 min Romberg on Airex with head turns and nods x 20 each each   [facility] Adult PT Treatment:                                                DATE: 11/19/2021 Therapeutic Exercise: NuStep L5 x 5 min with UE/LE while taking subjective SLR 2 x 10 each Bridge 2 x 10 Sit to stand without UE 2 x 10 Sidestepping in // bars with red at knees 3 x 2 lengths Forward step-up 6" x 10 each Lateral step-up and over 6" x 12 Neuro Reed: Tandem stance at counter 2 x 30 sec each Rockerboard fwd/bwd taps 2 x 1 min Romberg on Airex with head turns and nods 2 x 5 each SLS at counter 2 x 15 sec each  [facility] Treatment:                                                DATE: 11/11/2021 Therapeutic Exercise: Tandem stance at counter 2 x 30 sec each SLS at counter 2 x 15 sec each Sit to stand without UE x 10 Standing hip abduction with red at knees 2 x 10 each   PATIENT EDUCATION:  Education details: HEP update Person educated: Patient Education method: Explanation,  Demonstration, Tactile cues, Verbal cues, and Handouts Education comprehension: verbalized understanding, returned demonstration, verbal cues required, tactile cues required, and needs further education   HOME EXERCISE PROGRAM: Access Code: [email or web address]     ASSESSMENT: CLINICAL IMPRESSION: Patient tolerated therapy well with no adverse effects. Therapy continued to focus on progressing strength and balance training.   He is progressing well with his LE strengthening and only require minimal use of UE for support with dynamic balance training. He voices concern with gait balance during ambulation on unlevel surfaces and turning corners. DGI 17/24. Increased dynamic balance challenges with intermittent UE assist needed.  Patient would benefit from continued skilled PT to progress his strength and balance in order to improve walking ability and reduce fall risk.     OBJECTIVE IMPAIRMENTS Abnormal gait, decreased activity tolerance, decreased balance, decreased strength, and postural dysfunction.    ACTIVITY LIMITATIONS lifting, standing, stairs, and locomotion level   PARTICIPATION LIMITATIONS: meal prep, cleaning, community activity, and yard work   PERSONAL FACTORS Age, Fitness, Past/current experiences, Time since onset of injury/illness/exacerbation, and 3+ comorbidities: see above  are also affecting patient's functional outcome.      GOALS: Goals reviewed with patient? Yes   SHORT TERM GOALS: Target date: 12/09/2021    Patient will be I with initial HEP in order to progress with therapy. Baseline: HEP provided at eval Goal status: INITIAL   2.  PT will review FOTO with patient by 3rd visit in order to understand expected  progress and outcome with therapy. Baseline: FOTO assessed at eval 11/26/2021: reviewed Goal status: MET   3.  Patient will perform TUG </= 13 seconds to indicate improve mobility and reduced fall risk Baseline: 17 seconds 11/26/2021: 14 seconds Goal status:  PARTIALLY MET   LONG TERM GOALS: Target date: 01/06/2022    Patient will be I with final HEP to maintain progress from PT. Baseline: HEP provided at eval Goal status: INITIAL   2.  Patient will report >/= 53% status on FOTO to indicate improved functional ability. Baseline: 45% Goal status: INITIAL   3.  Patient will demonstrate BERG >/= 45/56 in order to indicate improved balance and reduced fall risk. Baseline: 36/56 Goal status: INITIAL   4.  Patient will perform 6MWT in >/= 1410 ft in order to indicate improved community access  Baseline: 880 ft Goal status: INITIAL   5.  Patient will exhibit 5xSTS in </= 12 sec in order indicate improved LE strength and reduced fall risk Baseline: 20 sec Goal status: INITIAL     PLAN: PT FREQUENCY: 1x/week   PT DURATION: 8 weeks   PLANNED INTERVENTIONS: Therapeutic exercises, Therapeutic activity, Neuromuscular re-education, Balance training, Gait training, Patient/Family education, Self Care, Joint mobilization, Joint manipulation, Aquatic Therapy, Dry Needling, Cryotherapy, Moist heat, Manual therapy, and Re-evaluation   PLAN FOR NEXT SESSION: Review HEP and progress PRN, progress LE strengthening, balance training (unstable surface, reaching outside base of support, eyes closed), gait and step training     Hessie Diener, PTA 12/03/21 10:50 AM Phone: [phone] Fax: [phone]

## 2021-12-09 ENCOUNTER — Other Ambulatory Visit: Payer: Self-pay | Admitting: Internal Medicine

## 2021-12-10 ENCOUNTER — Ambulatory Visit: Payer: Medicare Other | Attending: Physician Assistant | Admitting: Physical Therapy

## 2021-12-10 ENCOUNTER — Encounter: Payer: Self-pay | Admitting: Physical Therapy

## 2021-12-10 ENCOUNTER — Other Ambulatory Visit: Payer: Self-pay

## 2021-12-10 DIAGNOSIS — M6281 Muscle weakness (generalized): Secondary | ICD-10-CM | POA: Diagnosis not present

## 2021-12-10 DIAGNOSIS — R2689 Other abnormalities of gait and mobility: Secondary | ICD-10-CM | POA: Diagnosis not present

## 2021-12-10 NOTE — Therapy (Signed)
OUTPATIENT PHYSICAL THERAPY TREATMENT NOTE   Patient Name: Jacob Harrison MRN: [id number] DOB:1940/11/01, 81 y.o., male Today's Date: 12/10/2021  PCP: [name], PA-C   REFERRING PROVIDER: [name], PA-C   END OF SESSION:   PT End of Session - 12/10/21 0936     Visit Number 5    Number of Visits 9    Date for PT Re-Evaluation 01/06/22    Authorization Type UHC MCR    Authorization Time Period FOTO by 6th, KX by 15th    Progress Note Due on Visit 10    PT Start Time 0915    PT Stop Time 1000    PT Time Calculation (min) 45 min    Activity Tolerance Patient tolerated treatment well    Behavior During Therapy [facility] for tasks assessed/performed               Past Medical History:  Diagnosis Date   (HFpEF) heart failure with preserved ejection fraction ([place])    a. 10/2016 Echo: EF 65-70%, mild LVH, mildly dil LA. RV fxn nl.   Arthritis    BPH (benign prostatic hyperplasia)    Coronary artery disease    a. 2001 CABG x 3 [facility]): LIMA->LAD, VG->LCX, VG->RPDA; b. 04/05/2010 Cath ([place]): LM 65m LAD 1093mLCX 4048mCA 100p, LIMA->LAD atretic, VG->LCX 100, VG->RCA 80ost, 70d; c. 04/2010 Redo CABG x 2 ([place]): VG->LAD, VG->RPDA; d. 10/2012 Cath (FrSharlene MottsLM 20-30, LAD 100/95-55m36mX 40-22m,11m 100p, 80-100d, RPDA 99, RPL1 95, VG->LAD ok, VG->RPDA ok, EF 67%; e. 11/2016 MV: EF 48%, No ischemia.   Diabetes mellitus without complication (HCC) [place]   Hyperlipidemia    Hypertension    Ischemic cardiomyopathy    a. 03/2021 s/p MDT Visia AF MRI VR SureScan single lead AICD (ser# [id number]  Scoliosis    Spinal stenosis    Past Surgical History:  Procedure Laterality Date   CARDIAC CATHETERIZATION     CORONARY ARTERY BYPASS GRAFT     x 2 (2001 and redo in late 2011 or early 2012)   HERNIA REPAIR Left    inguinal   ICD IMPLANT N/A 03/20/2021   Procedure: ICD IMPLANT;  Surgeon: LambeVickie Epley  [facility];  Service:  Cardiovascular;  Laterality: N/A;   LEFT HEART CATH AND CORS/GRAFTS ANGIOGRAPHY N/A 10/04/2020   Procedure: LEFT HEART CATH AND CORS/GRAFTS ANGIOGRAPHY;  Surgeon: HardiLeonie Man  [facility];  Service: Cardiovascular;  Laterality: N/A;   LUMBAR LAMINECTOMY/DECOMPRESSION MICRODISCECTOMY N/A 10/20/2018   Procedure: L3-4, L4-5 LUMBAR DECOMPRESSION with dural repair.;  Surgeon: YatesMarybelle Killings  [facility];  Laterality: N/A;   TONSILLECTOMY AND ADENOIDECTOMY     TRANSURETHRAL RESECTION OF PROSTATE  1990   Patient Active Problem List   Diagnosis Date Noted   Acute on chronic heart failure with preserved ejection fraction (HFpEF) (HCC) [place]04/2022   Non-ST elevation (NSTEMI) myocardial infarction (HCC) [place]04/2022   Hypokalemia due to loss of potassium 10/08/2020   Coronary artery disease 10/04/2020   CHF (congestive heart failure) (HCC) [place]30/2022   Hx of CABG    Unstable angina (HCC) [place]29/2022   Chest pain 09/05/2020   Loss of balance 06/14/2020   Rotator cuff disorder 08/09/2019   Sinusitis 04/27/2019   BPH associated with nocturia 11/29/2018   Status post lumbar spine surgery for decompression of spinal cord 10/29/2018   Encounter for Medicare annual wellness exam 08/17/2018  Spinal stenosis of lumbar region 08/02/2018   Flu-like symptoms 05/05/2018   Bacterial conjunctivitis of both eyes 04/27/2018   Atypical mole 07/30/2017   Healthcare maintenance 07/30/2017   Wheezing 07/21/2017   Acute bronchitis 07/21/2017   Neck pain 06/08/2017   Sinus bradycardia 03/18/2017   Screening for colon cancer 02/25/2017   Ventral hernia without obstruction or gangrene 02/25/2017   (HFpEF) heart failure with preserved ejection fraction ([place]) 09/28/2016   Ischemic cardiomyopathy 09/28/2016   Cough in adult 07/22/2016   Vitamin D deficiency 06/27/2016   Vitamin B deficiency 06/27/2016   Mixed hyperlipidemia 06/21/2016   Weakness of both lower  extremities 06/21/2016   Type 2 diabetes mellitus without complication, without long-term current use of insulin ([place]) 05/20/2016   Essential hypertension 05/20/2016   Hyperlipidemia associated with type 2 diabetes mellitus ([place]) 05/20/2016   Coronary artery disease of native artery of native heart with stable angina pectoris ([place]) 05/20/2016    REFERRING DIAG: Unsteady gait  THERAPY DIAG:  Other abnormalities of gait and mobility  Muscle weakness (generalized)  Rationale for Evaluation and Treatment Rehabilitation  PERTINENT HISTORY: MI June 2022, DM, peripheral neuropathy BLE  PRECAUTIONS: Fall   SUBJECTIVE: Patient reports doing well, continues to report imbalance with walking outside but he does well with stationary exercises at home.  PAIN:  Are you having pain? No   OBJECTIVE: (objective measures completed at initial evaluation unless otherwise dated) PATIENT SURVEYS:  FOTO 45% functional status   SENSATION: Patient reports numbness L > R foot related to diabetic neuropathy   EDEMA:  Patient does exhibit bilateral lower leg edema   POSTURE:  Rounded shoulders, forward head   LOWER EXTREMITY MMT:   MMT Right eval Left eval  Hip flexion 4 4  Hip extension 4- 4-  Hip abduction 4- 4-  Knee flexion 4 4  Knee extension 4+ 4+  Ankle dorsiflexion 5 5  Ankle plantarflexion 4 4   FUNCTIONAL TESTS:  5 times sit to stand: 20 seconds  12/10/2021: 14 seconds Timed up and go (TUG): 17 seconds  11/26/2021: 14 seconds  12/10/2021: 13 seconds 6 minute walk test: 880 ft Berg Balance Scale: 36/56 12/03/21: DGI 17/24   GAIT: Distance walked: 880 ft Assistive device utilized: None Level of assistance: Complete Independence Comments: Decreased stride length, bilateral toe out with wide BOS, occasional shuffling gait     TODAY'S TREATMENT: [facility] Adult PT Treatment:                                                DATE: 12/10/21 Therapeutic Exercise: NuStep L6 x 5 min with  UE/LE while taking subjective Resisted backward and side stepping with FM 10# x 5 lengths each LAQ with 5# 2 x 15 each Sit to stand holding 8# at chest 2 x 10 Forward 6" step-up 2 x 10 each without UE support Lateral 6" step-up 2 x 10 each without UE support Heel raises 2 x 20 Neuromuscular re-ed: Rockerboard fwd/bwd taps 2 x 2 min Rockerboard lateral taps 2 x 2 min   [facility] Adult PT Treatment:  DATE: 12/03/21 Therapeutic Exercise: Nustep L5 UE/LE x 5 minutes Side stepping in // bars, no UE Backward gait in // bars, No UE Tandem gait in // bars- intermittent touch Step taps to 6 inch step - no UE  Step over hurdles forward- intermittent touch  Side step over hurdles- intermittent touch Neuromuscular re-ed: Rockerboard fwd/bwd taps 2 x 2 min Rockerboard lateral taps 2 x 2 min Romberg on Airex with head turns and nods x 20 each each, trunk rotations Therapeutic Activity: DGI: 17/24 (<19 fall risk) (>22 safe Ambulator)  Gait on level surface: 2 Change in gait speed:2 Gait with horiz head turns:2 Gait with vertical: 2 Gait and pivot: 3 Step over obstacle:2 Step around obstacles:2 Stairs: 2  [facility] Adult PT Treatment:                                                DATE: 11/26/2021 Therapeutic Exercise: NuStep L6 x 5 min with UE/LE while taking subjective Leg press (BATCA) 45# 3 x 10 Row with FM 20# 3 x 10 Standing hip abduction with green at knees 2 x 10 each Standing heel toe raises 2 x 20 Sit to stand holding 5# at chest 2 x 10 Forward step-up 6" 2 x 10 each Neuro Reed: Rockerboard fwd/bwd taps 2 x 2 min Rockerboard lateral taps 2 x 2 min Romberg on Airex with head turns and nods x 20 each each   PATIENT EDUCATION:  Education details: HEP Person educated: Patient Education method: Consulting civil engineer, Media planner, Corporate treasurer cues, Verbal cues Education comprehension: verbalized understanding, returned demonstration, verbal cues  required, tactile cues required, and needs further education   HOME EXERCISE PROGRAM: Access Code: [email or web address]     ASSESSMENT: CLINICAL IMPRESSION: Patient tolerated therapy well with no adverse effects. Therapy focused on progression of LE strengthening and balance this visit. He seems to be progressing well and demonstrates improvement in his TUG and 5xSTS indicating improved mobility, strength, and balance. He does continue to note imbalance with community ambulation or on unstable surfaces. Patient would benefit from continued skilled PT to progress his strength and balance in order to improve walking ability and reduce fall risk.    OBJECTIVE IMPAIRMENTS Abnormal gait, decreased activity tolerance, decreased balance, decreased strength, and postural dysfunction.    ACTIVITY LIMITATIONS lifting, standing, stairs, and locomotion level   PARTICIPATION LIMITATIONS: meal prep, cleaning, community activity, and yard work   PERSONAL FACTORS Age, Fitness, Past/current experiences, Time since onset of injury/illness/exacerbation, and 3+ comorbidities: see above  are also affecting patient's functional outcome.      GOALS: Goals reviewed with patient? Yes   SHORT TERM GOALS: Target date: 12/09/2021    Patient will be I with initial HEP in order to progress with therapy. Baseline: HEP provided at eval 12/10/2021: independent Goal status: MET   2.  PT will review FOTO with patient by 3rd visit in order to understand expected progress and outcome with therapy. Baseline: FOTO assessed at eval 11/26/2021: reviewed Goal status: MET   3.  Patient will perform TUG </= 13 seconds to indicate improve mobility and reduced fall risk Baseline: 17 seconds 11/26/2021: 14 seconds 12/10/2021: 13 seconds Goal status: MET   LONG TERM GOALS: Target date: 01/06/2022    Patient will be I with final HEP to maintain progress from PT. Baseline: HEP provided at eval Goal status: INITIAL  2.  Patient will report  >/= 53% status on FOTO to indicate improved functional ability. Baseline: 45% Goal status: INITIAL   3.  Patient will demonstrate BERG >/= 45/56 in order to indicate improved balance and reduced fall risk. Baseline: 36/56 Goal status: INITIAL   4.  Patient will perform 6MWT in >/= 1410 ft in order to indicate improved community access  Baseline: 880 ft Goal status: INITIAL   5.  Patient will exhibit 5xSTS in </= 12 sec in order indicate improved LE strength and reduced fall risk Baseline: 20 sec 12/10/2021: 14 sec Goal status: PARTIALLY MET     PLAN: PT FREQUENCY: 1x/week   PT DURATION: 8 weeks   PLANNED INTERVENTIONS: Therapeutic exercises, Therapeutic activity, Neuromuscular re-education, Balance training, Gait training, Patient/Family education, Self Care, Joint mobilization, Joint manipulation, Aquatic Therapy, Dry Needling, Cryotherapy, Moist heat, Manual therapy, and Re-evaluation   PLAN FOR NEXT SESSION: Review HEP and progress PRN, progress LE strengthening, balance training (unstable surface, reaching outside base of support, eyes closed), gait and step training     Hilda Blades, PT, DPT, LAT, ATC 12/10/21  10:11 AM Phone: [phone] Fax: [phone]

## 2021-12-11 ENCOUNTER — Ambulatory Visit: Payer: Medicare Other | Attending: Internal Medicine | Admitting: Internal Medicine

## 2021-12-11 ENCOUNTER — Encounter: Payer: Self-pay | Admitting: Internal Medicine

## 2021-12-11 VITALS — BP 110/68 | HR 70 | Ht 66.0 in | Wt 162.0 lb

## 2021-12-11 DIAGNOSIS — E785 Hyperlipidemia, unspecified: Secondary | ICD-10-CM

## 2021-12-11 DIAGNOSIS — I5022 Chronic systolic (congestive) heart failure: Secondary | ICD-10-CM | POA: Diagnosis not present

## 2021-12-11 DIAGNOSIS — E1169 Type 2 diabetes mellitus with other specified complication: Secondary | ICD-10-CM | POA: Diagnosis not present

## 2021-12-11 DIAGNOSIS — I25118 Atherosclerotic heart disease of native coronary artery with other forms of angina pectoris: Secondary | ICD-10-CM

## 2021-12-11 MED ORDER — EMPAGLIFLOZIN 10 MG PO TABS: 10.0000 mg | ORAL_TABLET | Freq: Every day | ORAL | 3 refills | Status: DC

## 2021-12-11 NOTE — Progress Notes (Addendum)
Follow-up Outpatient Visit Date: 12/11/2021  Primary Care Provider: [name], PA-C [place] [id number]  Chief Complaint: Follow-up coronary artery disease and HFrEF  HPI:  Mr. Whitner is a 81 y.o. 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  He was last seen in our office by Cadence Furth, [place], in late May, at which time he was concerned that losartan may be dropping his blood pressure too much.  He also noted intermittent dyspnea and leg edema.  Losartan was decreased to 12.5 mg daily.  Furosemide was increased to 80 mg every morning and 40 mg every afternoon for 3 days, after which time Mr. Rufo was to return back to 40 mg twice daily.  Today, Mr. Scurlock reports that he is feeling fairly well.  In the mornings, in particular, he feels good but gets a little bit more fatigued as the day goes on.  He is trying to walk and exercise regularly.  He is concerned about few episodes of headaches associated with elevated blood pressures, up to the 035 systolic.  He also feels a little short of breath when this happens.  He has not had any chest pain, palpitations, or lightheadedness.  His weights have been stable.  He feels like his left foot is slightly swollen when he goes to bed at night, though this has typically resolved by the next morning.  --------------------------------------------------------------------------------------------------  Cardiovascular History & Procedures: Cardiovascular Problems: Coronary artery disease Ischemic cardiomyopathy Heart failure with preserved ejection fraction (HFpEF)   Risk Factors: Known coronary artery disease, hypertension, hyperlipidemia,  diabetes mellitus, male gender, obesity, and age > 67   Cath/PCI: LHC (10/04/2020): LMCA with mild diffuse disease.  LAD with moderate diffuse proximal disease and chronic subtotal occlusion of the mid vessel.  Distal LAD fills via left to left and right to left collaterals.  LCx with moderate mid vessel disease.  RCA chronically occluded proximally, filling via left to right collaterals.  SVGs to RPDA, LAD, and OM are chronically occluded.  LIMA to LAD known to be atretic.  LVEDP 22 mmHg. LHC (10/18/12, [place], [place]): LMCA 20-30% ostial/proximal stenosis. LAD with sequential 100% and 95-99% mid stenoses. LCx with 40-50% mid stenosis. RCA with 100% proximal and 80-100% sequential distal lesions. RPDA and rPL1 have 99% and 95% stenoses, respectively. SVG->LAD and SVG->rPDA are patent. LVEF 67% with normal wall motion. LHC (04/05/10, [place], [place]): LMCA with 30% mid stenosis. LAD with 100% midvessel occlution. LCx with 40% mid stenosis. RCA with 100% proximal stenosis. LIMA->LAD atretic, SVG->LCx occluded, and SVG-> RCA with 80% ostial and 70% distal stenoses.   CV Surgery: Redo CABG (04/2010, [place], [place]): SVG->LAD and SVG->rPDA. CABG (2001, [place]): LIMA->LAD, SVG->LCx, and SVG->rPDA   EP Procedures and Devices: Medtronic single-chamber ICD (03/20/2021, Dr. Quentin Ore)   Non-Invasive Evaluation(s): Limited TTE (02/20/2021): LVEF 30-35%.  Mild mitral regurgitation.  Aortic sclerosis without stenosis. TTE (11/27/2020): Normal LV size with mild LVH.  LVEF 30-35% with extensive wall motion abnormalities.  Grade 3 diastolic dysfunction with elevated filling pressure noted.  Normal RV size and function.  Mild left atrial enlargement.  Degenerative mitral valve with mild thickening and mild-moderate MR.  Mild aortic stenosis noted with valve  area 2 cm and mean gradient 9 mmHg. TTE (09/23/2019): Normal LV size and wall thickness.  LVEF 60-65% with grade 1 diastolic dysfunction.   Normal RV size and function.  No atrial enlargement.  Aortic sclerosis without stenosis.  Normal CVP. Pharmacologic MPI (11/06/16): Low risk study without ischemia or scar. LVEF 48% (calculated) but visually appears normal. TTE (10/21/16): Normal LV size with mild LVH. LVEF 65-70% with normal diastolic function. Mild LA enlargement. Normal RV size and function. ABIs (03/08/15): Right 1.25, left 1.26; no significant change with exercise. Exercise myocardial perfusion stress test (10/07/12): Small to moderate in size, mild to moderate in severity, partially reversible defect involving the mid inferior and inferolateral segments. LVEF 65%.  Recent CV Pertinent Labs: Lab Results  Component Value Date   CHOL 136 10/23/2021   HDL 69 10/23/2021   LDLCALC 51 10/23/2021   TRIG 81 10/23/2021   CHOLHDL 2.0 10/23/2021   CHOLHDL 2.7 11/14/2016   BNP 599.9 (H) 10/25/2020   BNP 695.5 (H) 10/09/2020   K 4.4 10/23/2021   MG 1.9 02/25/2019   BUN 13 10/23/2021   CREATININE 0.88 10/23/2021    Past medical and surgical history were reviewed and updated in EPIC.  Current Meds  Medication Sig   acetaminophen (TYLENOL) 500 MG tablet Take 1,000 mg by mouth every 6 (six) hours as needed for moderate pain or headache.   aspirin EC 81 MG tablet Take 1 tablet (81 mg total) by mouth daily.   carvedilol (COREG) 12.5 MG tablet TAKE 1 TABLET (12.'5MG'$  TOTAL) BY MOUTH TWICE A DAY WITH MEALS   Cholecalciferol (VITAMIN D-3 PO) Take 800 Units by mouth daily.   clopidogrel (PLAVIX) 75 MG tablet Take 1 tablet (75 mg total) by mouth daily.   Coenzyme Q10 (CO Q-10) 200 MG CAPS Take 200 mg by mouth daily.    cyanocobalamin 500 MCG tablet Take 500 mcg by mouth daily.   diclofenac Sodium (VOLTAREN) 1 % GEL Apply 2 g topically 2 (two) times daily as needed.   ezetimibe (ZETIA) 10 MG tablet TAKE 1 TABLET BY MOUTH EVERY DAY   finasteride (PROSCAR) 5 MG tablet TAKE 1 TABLET BY MOUTH EVERY DAY   furosemide (LASIX) 40 MG tablet Take 1  tablet (40 mg total) by mouth daily.   Glucosamine-Chondroit-Vit C-Mn (GLUCOSAMINE 1500 COMPLEX) CAPS Take 1 capsule by mouth daily.   isosorbide mononitrate (IMDUR) 30 MG 24 hr tablet Take 30 mg (1 tablet) in the morning and 15 mg (1/2 tablet) in the evening.   Lancets (ONETOUCH ULTRASOFT) lancets Use to check blood sugars fasting daily   losartan (COZAAR) 25 MG tablet Take 0.5 tablets (12.5 mg total) by mouth daily.   metFORMIN (GLUCOPHAGE) 500 MG tablet 1/2 TABLET BY MOUTH DAILY WITH FOOD   Multiple Vitamins-Minerals (PRESERVISION AREDS 2 PO) Take 1 capsule by mouth 2 (two) times a day.   nitroGLYCERIN (NITROSTAT) 0.4 MG SL tablet PLACE 1 TABLET UNDER THE TONGUE EVERY 5 MINUTES AS NEEDED.   ranolazine (RANEXA) 1000 MG SR tablet TAKE 1 TABLET BY MOUTH TWICE A DAY   rosuvastatin (CRESTOR) 20 MG tablet TAKE 1 TABLET BY MOUTH EVERYDAY AT BEDTIME    Allergies: Other and Tetracyclines & related  Social History   Tobacco Use   Smoking status: Never   Smokeless tobacco: Never  Vaping Use   Vaping Use: Never used  Substance Use Topics   Alcohol use: Yes    Alcohol/week: 11.0 standard drinks of alcohol    Types: 5  Glasses of wine, 6 Cans of beer per week    Comment: 1-2 drinks on occasion previously 5-7 drinks a week (was 10-15 drinks/week)   Drug use: No    Family History  Problem Relation Age of Onset   Diabetes Mother    Hyperlipidemia Mother    Cancer Father        colon   Skin cancer Brother    Alcohol abuse Maternal Uncle    Diabetes Maternal Uncle    Hyperlipidemia Maternal Uncle    Cancer Paternal Uncle        colon    Review of Systems: A 12-system review of systems was performed and was negative except as noted in the HPI.  --------------------------------------------------------------------------------------------------  Physical Exam: BP 110/68 (BP Location: Left Arm, Patient Position: Sitting, Cuff Size: Normal)   Pulse 70   Ht '5\' 6"'$  (1.676 m)   Wt 162 lb  (73.5 kg)   SpO2 98%   BMI 26.15 kg/m   General:  NAD. Neck: No JVD or HJR. Lungs: Clear to auscultation bilaterally without wheezes or crackles. Heart: Regular rate and rhythm with 2/6 systolic murmur. Abdomen: Soft, nontender, nondistended. Extremities: Trace ankle edema bilaterally.  Lab Results  Component Value Date   WBC 10.8 10/23/2021   HGB 12.8 (L) 10/23/2021   HCT 38.8 10/23/2021   MCV 97 10/23/2021   PLT 234 10/23/2021    Lab Results  Component Value Date   NA 137 10/23/2021   K 4.4 10/23/2021   CL 98 10/23/2021   CO2 26 10/23/2021   BUN 13 10/23/2021   CREATININE 0.88 10/23/2021   GLUCOSE 96 10/23/2021   ALT 18 10/23/2021    Lab Results  Component Value Date   CHOL 136 10/23/2021   HDL 69 10/23/2021   LDLCALC 51 10/23/2021   TRIG 81 10/23/2021   CHOLHDL 2.0 10/23/2021    --------------------------------------------------------------------------------------------------  ASSESSMENT AND PLAN: Coronary artery disease with stable angina: No angina reported with current antianginal regimen consisting of carvedilol, isosorbide mononitrate, and ranolazine.  We will not make any medication changes today.  We will continue with long-term dual antiplatelet therapy, as tolerated, as well as aggressive lipid control with rosuvastatin and ezetimibe.  Chronic HFrEF: Mr. Rylee is trace pretibial edema today but otherwise appears euvolemic with NYHA class II heart failure symptoms.  His blood pressure remains low normal today though he has experienced some spikes at home.  We discussed potential escalation of losartan, transition to [place], and trial of an SGLT2 inhibitor.  We have elected to add empagliflozin 10 mg daily.  We will continue his current doses of carvedilol and losartan.  If his blood pressure tolerates, I would advocate for transitioning to Entresto at follow-up followed by addition of an aldosterone antagonist.  Continue ICD follow-up through the device  clinic.  Hyperlipidemia associated with type 2 diabetes mellitus: Lipids well controlled on last check in July.  As above, we will add empagliflozin 10 mg daily to further optimize goal-directed medical therapy of his HFrEF.  If his glucose allows, metformin could be discontinued altogether.  Follow-up: Return to clinic in 3 months.  Nelva Bush, MD 12/11/2021 4:15 PM

## 2021-12-11 NOTE — Patient Instructions (Signed)
Medication Instructions:   Your physician has recommended you make the following change in your medication:   START Jardiance 10 mg daily   *If you need a refill on your cardiac medications before your next appointment, please call your pharmacy*   Lab Work:  None ordered  Testing/Procedures:  None ordered   Follow-Up: At [facility], you and your health needs are our priority.  As part of our continuing mission to provide you with exceptional heart care, we have created designated Provider Care Teams.  These Care Teams include your primary Cardiologist (physician) and Advanced Practice Providers (APPs -  Physician Assistants and Nurse Practitioners) who all work together to provide you with the care you need, when you need it.  We recommend signing up for the patient portal called "MyChart".  Sign up information is provided on this After Visit Summary.  MyChart is used to connect with patients for Virtual Visits (Telemedicine).  Patients are able to view lab/test results, encounter notes, upcoming appointments, etc.  Non-urgent messages can be sent to your provider as well.   To learn more about what you can do with MyChart, go to [email or web address].    Your next appointment:   3 month(s)  The format for your next appointment:   In Person  Provider:   You may see Nelva Bush, MD or one of the following Advanced Practice Providers on your designated Care Team:   Murray Hodgkins, NP Christell Faith, PA-C Cadence Kathlen Mody, PA-C Gerrie Nordmann, NP     Important Information About Sugar

## 2021-12-13 ENCOUNTER — Encounter: Payer: Self-pay | Admitting: Internal Medicine

## 2021-12-13 DIAGNOSIS — I5022 Chronic systolic (congestive) heart failure: Secondary | ICD-10-CM | POA: Insufficient documentation

## 2021-12-16 NOTE — Therapy (Signed)
OUTPATIENT PHYSICAL THERAPY TREATMENT NOTE   Patient Name: Jacob Harrison MRN: [id number] DOB:1940-10-21, 81 y.o., male Today's Date: 12/17/2021  PCP: [name], PA-C   REFERRING PROVIDER: [name], PA-C   END OF SESSION:   PT End of Session - 12/17/21 1051     Visit Number 6    Number of Visits 9    Date for PT Re-Evaluation 01/06/22    Authorization Type UHC MCR    Authorization Time Period FOTO by 10th, KX by 15th    Progress Note Due on Visit 10    PT Start Time 1045    PT Stop Time 1130    PT Time Calculation (min) 45 min    Activity Tolerance Patient tolerated treatment well    Behavior During Therapy [facility] for tasks assessed/performed                Past Medical History:  Diagnosis Date   (HFpEF) heart failure with preserved ejection fraction ([place])    a. 10/2016 Echo: EF 65-70%, mild LVH, mildly dil LA. RV fxn nl.   Arthritis    BPH (benign prostatic hyperplasia)    Coronary artery disease    a. 2001 CABG x 3 [facility]): LIMA->LAD, VG->LCX, VG->RPDA; b. 04/05/2010 Cath ([place]): LM 71m LAD 1052mLCX 4095mCA 100p, LIMA->LAD atretic, VG->LCX 100, VG->RCA 80ost, 70d; c. 04/2010 Redo CABG x 2 ([place]): VG->LAD, VG->RPDA; d. 10/2012 Cath (FrSharlene MottsLM 20-30, LAD 100/95-67m73mX 40-1m,20m 100p, 80-100d, RPDA 99, RPL1 95, VG->LAD ok, VG->RPDA ok, EF 67%; e. 11/2016 MV: EF 48%, No ischemia.   Diabetes mellitus without complication (HCC) [place] II   Hyperlipidemia    Hypertension    Ischemic cardiomyopathy    a. 03/2021 s/p MDT Visia AF MRI VR SureScan single lead AICD (ser# [id number]  Scoliosis    Spinal stenosis    Past Surgical History:  Procedure Laterality Date   CARDIAC CATHETERIZATION     CORONARY ARTERY BYPASS GRAFT     x 2 (2001 and redo in late 2011 or early 2012)   HERNIA REPAIR Left    inguinal   ICD IMPLANT N/A 03/20/2021   Procedure: ICD IMPLANT;  Surgeon: LambeVickie Epley  [facility];   Service: Cardiovascular;  Laterality: N/A;   LEFT HEART CATH AND CORS/GRAFTS ANGIOGRAPHY N/A 10/04/2020   Procedure: LEFT HEART CATH AND CORS/GRAFTS ANGIOGRAPHY;  Surgeon: HardiLeonie Man  [facility];  Service: Cardiovascular;  Laterality: N/A;   LUMBAR LAMINECTOMY/DECOMPRESSION MICRODISCECTOMY N/A 10/20/2018   Procedure: L3-4, L4-5 LUMBAR DECOMPRESSION with dural repair.;  Surgeon: YatesMarybelle Killings  [facility];  Laterality: N/A;   TONSILLECTOMY AND ADENOIDECTOMY     TRANSURETHRAL RESECTION OF PROSTATE  1990   Patient Active Problem List   Diagnosis Date Noted   Chronic HFrEF (heart failure with reduced ejection fraction) (HCC) [place]08/2023   Acute on chronic heart failure with preserved ejection fraction (HFpEF) (HCC) [place]04/2022   Non-ST elevation (NSTEMI) myocardial infarction (HCC) [place]04/2022   Hypokalemia due to loss of potassium 10/08/2020   Coronary artery disease of native heart with stable angina pectoris (HCC) [place]30/2022   CHF (congestive heart failure) (HCC) [place]30/2022   Hx of CABG    Unstable angina (HCC) [place]29/2022   Chest pain 09/05/2020   Loss of balance 06/14/2020   Rotator cuff disorder 08/09/2019   Sinusitis 04/27/2019   BPH associated with nocturia 11/29/2018  Status post lumbar spine surgery for decompression of spinal cord 10/29/2018   Encounter for Medicare annual wellness exam 08/17/2018   Spinal stenosis of lumbar region 08/02/2018   Flu-like symptoms 05/05/2018   Bacterial conjunctivitis of both eyes 04/27/2018   Atypical mole 07/30/2017   Healthcare maintenance 07/30/2017   Wheezing 07/21/2017   Acute bronchitis 07/21/2017   Neck pain 06/08/2017   Sinus bradycardia 03/18/2017   Screening for colon cancer 02/25/2017   Ventral hernia without obstruction or gangrene 02/25/2017   (HFpEF) heart failure with preserved ejection fraction ([place]) 09/28/2016   Ischemic cardiomyopathy 09/28/2016   Cough in adult  07/22/2016   Vitamin D deficiency 06/27/2016   Vitamin B deficiency 06/27/2016   Mixed hyperlipidemia 06/21/2016   Weakness of both lower extremities 06/21/2016   Type 2 diabetes mellitus without complication, without long-term current use of insulin ([place]) 05/20/2016   Essential hypertension 05/20/2016   Hyperlipidemia associated with type 2 diabetes mellitus ([employer]) 05/20/2016   Coronary artery disease of native artery of native heart with stable angina pectoris ([place]) 05/20/2016    REFERRING DIAG: Unsteady gait  THERAPY DIAG:  Other abnormalities of gait and mobility  Muscle weakness (generalized)  Rationale for Evaluation and Treatment Rehabilitation  PERTINENT HISTORY: MI June 2022, DM, peripheral neuropathy BLE  PRECAUTIONS: Fall   SUBJECTIVE: Patient reports he feels stronger. He feels like he is improving with standing still balance but still with some imbalance in walking outside.   PAIN:  Are you having pain? No   OBJECTIVE: (objective measures completed at initial evaluation unless otherwise dated) PATIENT SURVEYS:  FOTO 45% functional status  12/17/2021: 58%   SENSATION: Patient reports numbness L > R foot related to diabetic neuropathy   EDEMA:  Patient does exhibit bilateral lower leg edema   POSTURE:  Rounded shoulders, forward head   LOWER EXTREMITY MMT:   MMT Right eval Left eval  Hip flexion 4 4  Hip extension 4- 4-  Hip abduction 4- 4-  Knee flexion 4 4  Knee extension 4+ 4+  Ankle dorsiflexion 5 5  Ankle plantarflexion 4 4   FUNCTIONAL TESTS:  5 times sit to stand: 20 seconds  12/10/2021: 14 seconds Timed up and go (TUG): 17 seconds  11/26/2021: 14 seconds  12/10/2021: 13 seconds 6 minute walk test: 880 ft Berg Balance Scale: 36/56 12/03/21: DGI 17/24   GAIT: Distance walked: 880 ft Assistive device utilized: None Level of assistance: Complete [place] Comments: Decreased stride length, bilateral toe out with wide BOS, occasional  shuffling gait     TODAY'S TREATMENT: [facility] Adult PT Treatment:                                                DATE: 12/17/21 Therapeutic Exercise: NuStep L6 x 5 min with UE/LE while taking subjective Resisted backward and side stepping with FM 10# x 5 lengths each Sidelying hip abduction 3 x 10 each Sit to stand holding 8# at chest 3 x 10 Forward 6" step-up 2 x 10 each without UE support SLR 2 x 15 each Bridge 2 x 10   [facility] Adult PT Treatment:  DATE: 12/10/21 Therapeutic Exercise: NuStep L6 x 5 min with UE/LE while taking subjective Resisted backward and side stepping with FM 10# x 5 lengths each LAQ with 5# 2 x 15 each Sit to stand holding 8# at chest 2 x 10 Forward 6" step-up 2 x 10 each without UE support Lateral 6" step-up 2 x 10 each without UE support Heel raises 2 x 20 Neuromuscular re-ed: Rockerboard fwd/bwd taps 2 x 2 min Rockerboard lateral taps 2 x 2 min  [facility] Adult PT Treatment:                                                DATE: 12/03/21 Therapeutic Exercise: Nustep L5 UE/LE x 5 minutes Side stepping in // bars, no UE Backward gait in // bars, No UE Tandem gait in // bars- intermittent touch Step taps to 6 inch step - no UE  Step over hurdles forward- intermittent touch  Side step over hurdles- intermittent touch Neuromuscular re-ed: Rockerboard fwd/bwd taps 2 x 2 min Rockerboard lateral taps 2 x 2 min Romberg on Airex with head turns and nods x 20 each each, trunk rotations Therapeutic Activity: DGI: 17/24 (<19 fall risk) (>22 safe Ambulator)  Gait on level surface: 2 Change in gait speed:2 Gait with horiz head turns:2 Gait with vertical: 2 Gait and pivot: 3 Step over obstacle:2 Step around obstacles:2 Stairs: 2   PATIENT EDUCATION:  Education details: HEP update, FOTO Person educated: Patient Education method: Explanation, Demonstration, Tactile cues, Verbal cues, Handout Education comprehension:  verbalized understanding, returned demonstration, verbal cues required, tactile cues required, and needs further education   HOME EXERCISE PROGRAM: Access Code: [email or web address]     ASSESSMENT: CLINICAL IMPRESSION: Patient tolerated therapy well with no adverse effects. He reports great improvement in his functional status this visit, achieving his LTG for FOTO. Therapy focused primarily on progress his LE and hip strength with good tolerance. He does demonstrate greater difficulty with strengthening on left LE. Modified his HEP to perform hip abduction in sidelying position. Patient would benefit from continued skilled PT to progress his strength and balance in order to improve walking ability and reduce fall risk.    OBJECTIVE IMPAIRMENTS Abnormal gait, decreased activity tolerance, decreased balance, decreased strength, and postural dysfunction.    ACTIVITY LIMITATIONS lifting, standing, stairs, and locomotion level   PARTICIPATION LIMITATIONS: meal prep, cleaning, community activity, and yard work   PERSONAL FACTORS Age, Fitness, Past/current experiences, Time since onset of injury/illness/exacerbation, and 3+ comorbidities: see above  are also affecting patient's functional outcome.      GOALS: Goals reviewed with patient? Yes   SHORT TERM GOALS: Target date: 12/09/2021    Patient will be I with initial HEP in order to progress with therapy. Baseline: HEP provided at eval 12/10/2021: independent Goal status: MET   2.  PT will review FOTO with patient by 3rd visit in order to understand expected progress and outcome with therapy. Baseline: FOTO assessed at eval 11/26/2021: reviewed Goal status: MET   3.  Patient will perform TUG </= 13 seconds to indicate improve mobility and reduced fall risk Baseline: 17 seconds 11/26/2021: 14 seconds 12/10/2021: 13 seconds Goal status: MET   LONG TERM GOALS: Target date: 01/06/2022    Patient will be I with final HEP to maintain progress from  PT. Baseline: HEP provided at eval Goal status: INITIAL  2.  Patient will report >/= 53% status on FOTO to indicate improved functional ability. Baseline: 45% 12/17/2021: 58% Goal status: MET   3.  Patient will demonstrate BERG >/= 45/56 in order to indicate improved balance and reduced fall risk. Baseline: 36/56 Goal status: INITIAL   4.  Patient will perform 6MWT in >/= 1410 ft in order to indicate improved community access  Baseline: 880 ft Goal status: INITIAL   5.  Patient will exhibit 5xSTS in </= 12 sec in order indicate improved LE strength and reduced fall risk Baseline: 20 sec 12/10/2021: 14 sec Goal status: PARTIALLY MET     PLAN: PT FREQUENCY: 1x/week   PT DURATION: 8 weeks   PLANNED INTERVENTIONS: Therapeutic exercises, Therapeutic activity, Neuromuscular re-education, Balance training, Gait training, Patient/Family education, Self Care, Joint mobilization, Joint manipulation, Aquatic Therapy, Dry Needling, Cryotherapy, Moist heat, Manual therapy, and Re-evaluation   PLAN FOR NEXT SESSION: Review HEP and progress PRN, progress LE strengthening, balance training (unstable surface, reaching outside base of support, eyes closed), gait and step training     Hilda Blades, PT, DPT, LAT, ATC 12/17/21  11:28 AM Phone: [phone] Fax: [phone]

## 2021-12-17 ENCOUNTER — Encounter: Payer: Self-pay | Admitting: Physical Therapy

## 2021-12-17 ENCOUNTER — Ambulatory Visit: Payer: Medicare Other | Admitting: Physical Therapy

## 2021-12-17 ENCOUNTER — Other Ambulatory Visit: Payer: Self-pay

## 2021-12-17 DIAGNOSIS — R2689 Other abnormalities of gait and mobility: Secondary | ICD-10-CM

## 2021-12-17 DIAGNOSIS — M6281 Muscle weakness (generalized): Secondary | ICD-10-CM | POA: Diagnosis not present

## 2021-12-17 NOTE — Patient Instructions (Signed)
Access Code: [email or web address] URL: [email or web address] Date: 12/17/2021 Prepared by: Hilda Blades  Exercises - Standing Tandem Balance with Counter Support  - 2 x daily - 7 x weekly - 2 reps - 30 seconds hold - Standing Single Leg Stance with Counter Support  - 2 x daily - 7 x weekly - 3 reps - 15 seconds hold - Romberg Stance with Head Nods  - 2 x daily - 7 x weekly - 2 sets - 5 reps - Romberg Stance with Head Rotation  - 2 x daily - 7 x weekly - 2 sets - 5 reps - Sit to Stand Without Arm Support  - 2 x daily - 7 x weekly - 2 sets - 10 reps - Standing Row with Anchored Resistance  - 2 x daily - 7 x weekly - 2 sets - 10 reps - Sidelying Hip Abduction  - 1 x daily - 7 x weekly - 3 sets - 10 reps

## 2021-12-18 ENCOUNTER — Ambulatory Visit (INDEPENDENT_AMBULATORY_CARE_PROVIDER_SITE_OTHER): Payer: Medicare Other

## 2021-12-18 DIAGNOSIS — I255 Ischemic cardiomyopathy: Secondary | ICD-10-CM

## 2021-12-20 LAB — CUP PACEART REMOTE DEVICE CHECK
Battery Remaining Longevity: 127 mo
Battery Voltage: 3.04 V
Brady Statistic RV Percent Paced: 1.64 %
Date Time Interrogation Session: 20230913012204
HighPow Impedance: 67 Ohm
Implantable Lead Implant Date: 20221214
Implantable Lead Location: 753860
Implantable Pulse Generator Implant Date: 20221214
Lead Channel Impedance Value: 304 Ohm
Lead Channel Impedance Value: 437 Ohm
Lead Channel Pacing Threshold Amplitude: 0.875 V
Lead Channel Pacing Threshold Pulse Width: 0.4 ms
Lead Channel Sensing Intrinsic Amplitude: 13.125 mV
Lead Channel Sensing Intrinsic Amplitude: 13.125 mV
Lead Channel Setting Pacing Amplitude: 1.5 V
Lead Channel Setting Pacing Pulse Width: 0.4 ms
Lead Channel Setting Sensing Sensitivity: 0.3 mV

## 2021-12-24 ENCOUNTER — Ambulatory Visit: Payer: Medicare Other | Admitting: Physical Therapy

## 2021-12-24 ENCOUNTER — Encounter: Payer: Self-pay | Admitting: Physical Therapy

## 2021-12-24 DIAGNOSIS — M6281 Muscle weakness (generalized): Secondary | ICD-10-CM | POA: Diagnosis not present

## 2021-12-24 DIAGNOSIS — R2689 Other abnormalities of gait and mobility: Secondary | ICD-10-CM | POA: Diagnosis not present

## 2021-12-24 NOTE — Therapy (Signed)
OUTPATIENT PHYSICAL THERAPY TREATMENT NOTE   Patient Name: Jacob Harrison MRN: [id number] DOB:[birth year], 81 y.o., male Today's Date: 12/24/2021  PCP: [name], PA-C   REFERRING PROVIDER: [name], PA-C   END OF SESSION:   PT End of Session - 12/24/21 1021     Visit Number 7    Number of Visits 9    Date for PT Re-Evaluation 01/06/22    Authorization Type UHC MCR    Authorization Time Period FOTO by 10th, KX by 15th    PT Start Time 1019    PT Stop Time 1100    PT Time Calculation (min) 41 min                Past Medical History:  Diagnosis Date   (HFpEF) heart failure with preserved ejection fraction ([place])    a. 10/2016 Echo: EF 65-70%, mild LVH, mildly dil LA. RV fxn nl.   Arthritis    BPH (benign prostatic hyperplasia)    Coronary artery disease    a. 2001 CABG x 3 [facility]): LIMA->LAD, VG->LCX, VG->RPDA; b. 04/05/2010 Cath ([place]): LM 41m LAD 1061mLCX 401mCA 100p, LIMA->LAD atretic, VG->LCX 100, VG->RCA 80ost, 70d; c. 04/2010 Redo CABG x 2 ([place]): VG->LAD, VG->RPDA; d. 10/2012 Cath (FrSharlene MottsLM 20-30, LAD 100/95-84m66mX 40-17m,34m 100p, 80-100d, RPDA 99, RPL1 95, VG->LAD ok, VG->RPDA ok, EF 67%; e. 11/2016 MV: EF 48%, No ischemia.   Diabetes mellitus without complication (HCC) [place]   Hyperlipidemia    Hypertension    Ischemic cardiomyopathy    a. 03/2021 s/p MDT Visia AF MRI VR SureScan single lead AICD (ser# [id number]  Scoliosis    Spinal stenosis    Past Surgical History:  Procedure Laterality Date   CARDIAC CATHETERIZATION     CORONARY ARTERY BYPASS GRAFT     x 2 (2001 and redo in late 2011 or early 2012)   HERNIA REPAIR Left    inguinal   ICD IMPLANT N/A 03/20/2021   Procedure: ICD IMPLANT;  Surgeon: LambeVickie Epley  [facility];  Service: Cardiovascular;  Laterality: N/A;   LEFT HEART CATH AND CORS/GRAFTS ANGIOGRAPHY N/A 10/04/2020   Procedure: LEFT HEART CATH AND CORS/GRAFTS  ANGIOGRAPHY;  Surgeon: HardiLeonie Man  [facility];  Service: Cardiovascular;  Laterality: N/A;   LUMBAR LAMINECTOMY/DECOMPRESSION MICRODISCECTOMY N/A 10/20/2018   Procedure: L3-4, L4-5 LUMBAR DECOMPRESSION with dural repair.;  Surgeon: YatesMarybelle Killings  [facility] [place];  Laterality: N/A;   TONSILLECTOMY AND ADENOIDECTOMY     TRANSURETHRAL RESECTION OF PROSTATE  1990   Patient Active Problem List   Diagnosis Date Noted   Chronic HFrEF (heart failure with reduced ejection fraction) (HCC) [place]08/2023   Acute on chronic heart failure with preserved ejection fraction (HFpEF) (HCC) [place]04/2022   Non-ST elevation (NSTEMI) myocardial infarction (HCC) [place]04/2022   Hypokalemia due to loss of potassium 10/08/2020   Coronary artery disease of native heart with stable angina pectoris (HCC) [place]30/2022   CHF (congestive heart failure) (HCC) [place]30/2022   Hx of CABG    Unstable angina (HCC) [place]29/2022   Chest pain 09/05/2020   Loss of balance 06/14/2020   Rotator cuff disorder 08/09/2019   Sinusitis 04/27/2019   BPH associated with nocturia 11/29/2018   Status post lumbar spine surgery for decompression of spinal cord 10/29/2018   Encounter for Medicare annual wellness exam 08/17/2018   Spinal stenosis of lumbar region  08/02/2018   Flu-like symptoms 05/05/2018   Bacterial conjunctivitis of both eyes 04/27/2018   Atypical mole 07/30/2017   Healthcare maintenance 07/30/2017   Wheezing 07/21/2017   Acute bronchitis 07/21/2017   Neck pain 06/08/2017   Sinus bradycardia 03/18/2017   Screening for colon cancer 02/25/2017   Ventral hernia without obstruction or gangrene 02/25/2017   (HFpEF) heart failure with preserved ejection fraction ([place]) 09/28/2016   Ischemic cardiomyopathy 09/28/2016   Cough in adult 07/22/2016   Vitamin D deficiency 06/27/2016   Vitamin B deficiency 06/27/2016   Mixed hyperlipidemia 06/21/2016   Weakness of both lower extremities  06/21/2016   Type 2 diabetes mellitus without complication, without long-term current use of insulin ([place]) 05/20/2016   Essential hypertension 05/20/2016   Hyperlipidemia associated with type 2 diabetes mellitus ([place]) 05/20/2016   Coronary artery disease of native artery of native heart with stable angina pectoris ([place]) 05/20/2016    REFERRING DIAG: Unsteady gait  THERAPY DIAG:  Other abnormalities of gait and mobility  Muscle weakness (generalized)  Rationale for Evaluation and Treatment Rehabilitation  PERTINENT HISTORY: MI June 2022, DM, peripheral neuropathy BLE  PRECAUTIONS: Fall   SUBJECTIVE: Patient reports he feels stronger however reports balance is still off. He reports frequently his shoulder will hit the doorway when he walks through.   PAIN:  Are you having pain? No   OBJECTIVE: (objective measures completed at initial evaluation unless otherwise dated) PATIENT SURVEYS:  FOTO 45% functional status  12/17/2021: 58%   SENSATION: Patient reports numbness L > R foot related to diabetic neuropathy   EDEMA:  Patient does exhibit bilateral lower leg edema   POSTURE:  Rounded shoulders, forward head   LOWER EXTREMITY MMT:   MMT Right eval Left eval  Hip flexion 4 4  Hip extension 4- 4-  Hip abduction 4- 4-  Knee flexion 4 4  Knee extension 4+ 4+  Ankle dorsiflexion 5 5  Ankle plantarflexion 4 4   FUNCTIONAL TESTS:  5 times sit to stand: 20 seconds  12/10/2021: 14 seconds Timed up and go (TUG): 17 seconds  11/26/2021: 14 seconds  12/10/2021: 13 seconds 6 minute walk test: 880 ft Berg Balance Scale: 36/56 12/03/21: DGI 17/24   GAIT: Distance walked: 880 ft Assistive device utilized: None Level of assistance: Complete Independence Comments: Decreased stride length, bilateral toe out with wide BOS, occasional shuffling gait     TODAY'S TREATMENT: [facility] Adult PT Treatment:                                                DATE: 12/24/21 Therapeutic  Exercise: NuStep L6 x 5 min with UE/LE while taking subjective Resisted backward and side stepping with FM 13# x 5 lengths each Sit to stand holding 10# at chest 3 x 10 Forward 6" step-up 2 x 10 each without UE support Alternating step taps to 6 inch x 10 without UE or assist  SLS 60 sec right, 15 sec L Tandem stance 60 sec each Rocker board A/P and lateral weight shifting.  Agility ladder used for step planning   [facility] Adult PT Treatment:  DATE: 12/17/21 Therapeutic Exercise: NuStep L6 x 5 min with UE/LE while taking subjective Resisted backward and side stepping with FM 10# x 5 lengths each Sidelying hip abduction 3 x 10 each Sit to stand holding 8# at chest 3 x 10 Forward 6" step-up 2 x 10 each without UE support SLR 2 x 15 each Bridge 2 x 10   [facility] Adult PT Treatment:                                                DATE: 12/10/21 Therapeutic Exercise: NuStep L6 x 5 min with UE/LE while taking subjective Resisted backward and side stepping with FM 10# x 5 lengths each LAQ with 5# 2 x 15 each Sit to stand holding 8# at chest 2 x 10 Forward 6" step-up 2 x 10 each without UE support Lateral 6" step-up 2 x 10 each without UE support Heel raises 2 x 20 Neuromuscular re-ed: Rockerboard fwd/bwd taps 2 x 2 min Rockerboard lateral taps 2 x 2 min  [facility] Adult PT Treatment:                                                DATE: 12/03/21 Therapeutic Exercise: Nustep L5 UE/LE x 5 minutes Side stepping in // bars, no UE Backward gait in // bars, No UE Tandem gait in // bars- intermittent touch Step taps to 6 inch step - no UE  Step over hurdles forward- intermittent touch  Side step over hurdles- intermittent touch Neuromuscular re-ed: Rockerboard fwd/bwd taps 2 x 2 min Rockerboard lateral taps 2 x 2 min Romberg on Airex with head turns and nods x 20 each each, trunk rotations Therapeutic Activity: DGI: 17/24 (<19 fall risk) (>22 safe  Ambulator)  Gait on level surface: 2 Change in gait speed:2 Gait with horiz head turns:2 Gait with vertical: 2 Gait and pivot: 3 Step over obstacle:2 Step around obstacles:2 Stairs: 2   PATIENT EDUCATION:  Education details: HEP update, FOTO Person educated: Patient Education method: Explanation, Demonstration, Tactile cues, Verbal cues, Handout Education comprehension: verbalized understanding, returned demonstration, verbal cues required, tactile cues required, and needs further education   HOME EXERCISE PROGRAM: Access Code: [email or web address]     ASSESSMENT: CLINICAL IMPRESSION: Patient tolerated therapy well with no adverse effects. Therapy focused primarily on balance training. Noted improvement in previous BERG activities.  Patient would benefit from continued skilled PT to progress his strength and balance in order to improve walking ability and reduce fall risk.    OBJECTIVE IMPAIRMENTS Abnormal gait, decreased activity tolerance, decreased balance, decreased strength, and postural dysfunction.    ACTIVITY LIMITATIONS lifting, standing, stairs, and locomotion level   PARTICIPATION LIMITATIONS: meal prep, cleaning, community activity, and yard work   PERSONAL FACTORS Age, Fitness, Past/current experiences, Time since onset of injury/illness/exacerbation, and 3+ comorbidities: see above  are also affecting patient's functional outcome.      GOALS: Goals reviewed with patient? Yes   SHORT TERM GOALS: Target date: 12/09/2021    Patient will be I with initial HEP in order to progress with therapy. Baseline: HEP provided at eval 12/10/2021: independent Goal status: MET   2.  PT will review FOTO with patient by 3rd visit in order to  understand expected progress and outcome with therapy. Baseline: FOTO assessed at eval 11/26/2021: reviewed Goal status: MET   3.  Patient will perform TUG </= 13 seconds to indicate improve mobility and reduced fall risk Baseline: 17  seconds 11/26/2021: 14 seconds 12/10/2021: 13 seconds Goal status: MET   LONG TERM GOALS: Target date: 01/06/2022    Patient will be I with final HEP to maintain progress from PT. Baseline: HEP provided at eval Goal status: INITIAL   2.  Patient will report >/= 53% status on FOTO to indicate improved functional ability. Baseline: 45% 12/17/2021: 58% Goal status: MET   3.  Patient will demonstrate BERG >/= 45/56 in order to indicate improved balance and reduced fall risk. Baseline: 36/56 Goal status: INITIAL   4.  Patient will perform 6MWT in >/= 1410 ft in order to indicate improved community access  Baseline: 880 ft Goal status: INITIAL   5.  Patient will exhibit 5xSTS in </= 12 sec in order indicate improved LE strength and reduced fall risk Baseline: 20 sec 12/10/2021: 14 sec Goal status: PARTIALLY MET     PLAN: PT FREQUENCY: 1x/week   PT DURATION: 8 weeks   PLANNED INTERVENTIONS: Therapeutic exercises, Therapeutic activity, Neuromuscular re-education, Balance training, Gait training, Patient/Family education, Self Care, Joint mobilization, Joint manipulation, Aquatic Therapy, Dry Needling, Cryotherapy, Moist heat, Manual therapy, and Re-evaluation   PLAN FOR NEXT SESSION: Review HEP and progress PRN, progress LE strengthening, balance training (unstable surface, reaching outside base of support, eyes closed), gait and step training     Hessie Diener, PTA 12/24/21 12:07 PM Phone: [phone] Fax: [phone]

## 2021-12-28 ENCOUNTER — Other Ambulatory Visit: Payer: Self-pay | Admitting: Internal Medicine

## 2021-12-28 ENCOUNTER — Other Ambulatory Visit: Payer: Self-pay | Admitting: Medical

## 2021-12-28 DIAGNOSIS — I25118 Atherosclerotic heart disease of native coronary artery with other forms of angina pectoris: Secondary | ICD-10-CM

## 2021-12-30 NOTE — Therapy (Signed)
OUTPATIENT PHYSICAL THERAPY TREATMENT NOTE  DISCHARGE   Patient Name: Jacob Harrison MRN: [id number] DOB:26-Nov-1940, 81 y.o., male Today's Date: 12/31/2021  PCP: [name], PA-C   REFERRING PROVIDER: [name], PA-C   END OF SESSION:   PT End of Session - 12/31/21 0957     Visit Number 8    Number of Visits 9    Date for PT Re-Evaluation 01/06/22    Authorization Type UHC MCR    Authorization Time Period FOTO by 10th, KX by 15th    Progress Note Due on Visit 10    PT Start Time 1000    PT Stop Time 1045    PT Time Calculation (min) 45 min    Activity Tolerance Patient tolerated treatment well    Behavior During Therapy [facility] for tasks assessed/performed                 Past Medical History:  Diagnosis Date   (HFpEF) heart failure with preserved ejection fraction ([place])    a. 10/2016 Echo: EF 65-70%, mild LVH, mildly dil LA. RV fxn nl.   Arthritis    BPH (benign prostatic hyperplasia)    Coronary artery disease    a. 2001 CABG x 3 [facility]): LIMA->LAD, VG->LCX, VG->RPDA; b. 04/05/2010 Cath ([place]): LM 46m LAD 1065mLCX 4068mCA 100p, LIMA->LAD atretic, VG->LCX 100, VG->RCA 80ost, 70d; c. 04/2010 Redo CABG x 2 ([place]): VG->LAD, VG->RPDA; d. 10/2012 Cath (FrSharlene MottsLM 20-30, LAD 100/95-48m36mX 40-26m,88m 100p, 80-100d, RPDA 99, RPL1 95, VG->LAD ok, VG->RPDA ok, EF 67%; e. 11/2016 MV: EF 48%, No ischemia.   Diabetes mellitus without complication (HCC) [place] II   Hyperlipidemia    Hypertension    Ischemic cardiomyopathy    a. 03/2021 s/p MDT Visia AF MRI VR SureScan single lead AICD (ser# [id number]  Scoliosis    Spinal stenosis    Past Surgical History:  Procedure Laterality Date   CARDIAC CATHETERIZATION     CORONARY ARTERY BYPASS GRAFT     x 2 (2001 and redo in late 2011 or early 2012)   HERNIA REPAIR Left    inguinal   ICD IMPLANT N/A 03/20/2021   Procedure: ICD IMPLANT;  Surgeon: LambeVickie Epley  [facility];  Service: Cardiovascular;  Laterality: N/A;   LEFT HEART CATH AND CORS/GRAFTS ANGIOGRAPHY N/A 10/04/2020   Procedure: LEFT HEART CATH AND CORS/GRAFTS ANGIOGRAPHY;  Surgeon: HardiLeonie Man  [facility];  Service: Cardiovascular;  Laterality: N/A;   LUMBAR LAMINECTOMY/DECOMPRESSION MICRODISCECTOMY N/A 10/20/2018   Procedure: L3-4, L4-5 LUMBAR DECOMPRESSION with dural repair.;  Surgeon: YatesMarybelle Killings  [facility] [place];  Laterality: N/A;   TONSILLECTOMY AND ADENOIDECTOMY     TRANSURETHRAL RESECTION OF PROSTATE  1990   Patient Active Problem List   Diagnosis Date Noted   Chronic HFrEF (heart failure with reduced ejection fraction) (HCC) [place]08/2023   Acute on chronic heart failure with preserved ejection fraction (HFpEF) (HCC) [place]04/2022   Non-ST elevation (NSTEMI) myocardial infarction (HCC) [place]04/2022   Hypokalemia due to loss of potassium 10/08/2020   Coronary artery disease of native heart with stable angina pectoris (HCC) [place]30/2022   CHF (congestive heart failure) (HCC) [place]30/2022   Hx of CABG    Unstable angina (HCC) [place]29/2022   Chest pain 09/05/2020   Loss of balance 06/14/2020   Rotator cuff disorder 08/09/2019   Sinusitis 04/27/2019   BPH associated with  nocturia 11/29/2018   Status post lumbar spine surgery for decompression of spinal cord 10/29/2018   Encounter for Medicare annual wellness exam 08/17/2018   Spinal stenosis of lumbar region 08/02/2018   Flu-like symptoms 05/05/2018   Bacterial conjunctivitis of both eyes 04/27/2018   Atypical mole 07/30/2017   Healthcare maintenance 07/30/2017   Wheezing 07/21/2017   Acute bronchitis 07/21/2017   Neck pain 06/08/2017   Sinus bradycardia 03/18/2017   Screening for colon cancer 02/25/2017   Ventral hernia without obstruction or gangrene 02/25/2017   (HFpEF) heart failure with preserved ejection fraction ([place]) 09/28/2016   Ischemic cardiomyopathy 09/28/2016   Cough in  adult 07/22/2016   Vitamin D deficiency 06/27/2016   Vitamin B deficiency 06/27/2016   Mixed hyperlipidemia 06/21/2016   Weakness of both lower extremities 06/21/2016   Type 2 diabetes mellitus without complication, without long-term current use of insulin ([place]) 05/20/2016   Essential hypertension 05/20/2016   Hyperlipidemia associated with type 2 diabetes mellitus ([place]) 05/20/2016   Coronary artery disease of native artery of native heart with stable angina pectoris ([place]) 05/20/2016    REFERRING DIAG: Unsteady gait  THERAPY DIAG:  Other abnormalities of gait and mobility  Muscle weakness (generalized)  Rationale for Evaluation and Treatment Rehabilitation  PERTINENT HISTORY: MI June 2022, DM, peripheral neuropathy BLE  PRECAUTIONS: Fall   SUBJECTIVE: Patient reports he had some arthritis flare up on him, left knee and left shoulder, so didn't do a lot of walking and used a cane for the majority of the week.   PAIN:  Are you having pain? No   OBJECTIVE: (objective measures completed at initial evaluation unless otherwise dated) PATIENT SURVEYS:  FOTO 45% functional status  12/17/2021: 58%   SENSATION: Patient reports numbness L > R foot related to diabetic neuropathy   EDEMA:  Patient does exhibit bilateral lower leg edema   POSTURE:  Rounded shoulders, forward head   LOWER EXTREMITY MMT:   MMT Right eval Left eval  Hip flexion 4 4  Hip extension 4- 4-  Hip abduction 4- 4-  Knee flexion 4 4  Knee extension 4+ 4+  Ankle dorsiflexion 5 5  Ankle plantarflexion 4 4   FUNCTIONAL TESTS:  5 times sit to stand: 20 seconds  12/10/2021: 14 seconds  12/31/2021: 9 seconds Timed up and go (TUG): 17 seconds  11/26/2021: 14 seconds  12/10/2021: 13 seconds 6 minute walk test: 880 ft  12/31/2021: 1,160 ft Berg Balance Scale: 36/56  12/31/2021: 50/56 12/03/21: DGI 17/24   BERG BALANCE TEST Sitting to Standing: 4.      Stands without using hands and stabilize  independently Standing Unsupported: 4.      Stands safely for 2 minutes Sitting Unsupported: 4.     Sits for 2 minutes independently Standing to Sitting: 4.     Sits safely with minimal use of hands Transfers: 4.     Transfers safely with minor use of hands Standing with eyes closed: 4.     Stands safely for 10 seconds  Standing with feet together: 4.     Stands for 1 minute safely Reaching forward with outstretched arm: 4.     Reaches forward 10 inches Retrieving object from the floor: 4.      Able to pick up easily and safely Turning to look behind: 2.     Turns sideways only, maintains balance Turning 360 degrees: 2.     Able to turn slowly, but safely Place alternate foot on stool: 4.  Completes 8 steps in 20 seconds     Standing with one foot in front: 3.     Independent foot ahead for 30 seconds Standing on one foot: 3.     Holds 5-10 seconds Total Score: 50/56  GAIT: Distance walked: 1,160 ft Assistive device utilized: None Level of assistance: Complete Independence Comments: Bilateral toe out with wide BOS, occasional shuffling gait but improved since eval     TODAY'S TREATMENT: [facility] Adult PT Treatment:                                                DATE: 12/31/21 Therapeutic Exercise: NuStep L6 x 5 min with UE/LE while taking subjective 6MWT improving aerobic capacity Sidelying hip abduction 3 x 10 each Sit to stand 3 x 10 Row with green 3 x 10 Tandem walking at counter x 2 length down/back Side stepping at counter x 2 length down/back Therapeutic Activity: Assessment of progress toward goals including BERG balance   [facility] Adult PT Treatment:                                                DATE: 12/24/21 Therapeutic Exercise: NuStep L6 x 5 min with UE/LE while taking subjective Resisted backward and side stepping with FM 13# x 5 lengths each Sit to stand holding 10# at chest 3 x 10 Forward 6" step-up 2 x 10 each without UE support Alternating step taps to 6 inch x  10 without UE or assist  SLS 60 sec right, 15 sec L Tandem stance 60 sec each Rocker board A/P and lateral weight shifting.  Agility ladder used for step planning   [facility] Adult PT Treatment:                                                DATE: 12/17/21 Therapeutic Exercise: NuStep L6 x 5 min with UE/LE while taking subjective Resisted backward and side stepping with FM 10# x 5 lengths each Sidelying hip abduction 3 x 10 each Sit to stand holding 8# at chest 3 x 10 Forward 6" step-up 2 x 10 each without UE support SLR 2 x 15 each Bridge 2 x 10   PATIENT EDUCATION:  Education details: POC discharge, progress toward goals, HEP Person educated: Patient Education method: Consulting civil engineer, Demonstration Education comprehension: verbalized understanding, returned demonstration   HOME EXERCISE PROGRAM: Access Code: [email or web address]     ASSESSMENT: CLINICAL IMPRESSION: Patient tolerated therapy well with no adverse effects. He has made great progress in therapy and demonstrates much improved strength, mobility, balance and walking ability. He has achieved all his goals and is independent with his HEP. Patient will be formally discharged from PT at this time.   OBJECTIVE IMPAIRMENTS Abnormal gait, decreased activity tolerance, decreased balance, decreased strength, and postural dysfunction.    ACTIVITY LIMITATIONS lifting, standing, stairs, and locomotion level   PARTICIPATION LIMITATIONS: meal prep, cleaning, community activity, and yard work   PERSONAL FACTORS Age, Fitness, Past/current experiences, Time since onset of injury/illness/exacerbation, and 3+ comorbidities: see above  are also affecting patient's functional outcome.  GOALS: Goals reviewed with patient? Yes   SHORT TERM GOALS: Target date: 12/09/2021    Patient will be I with initial HEP in order to progress with therapy. Baseline: HEP provided at eval 12/10/2021: independent Goal status: MET   2.  PT will review FOTO with  patient by 3rd visit in order to understand expected progress and outcome with therapy. Baseline: FOTO assessed at eval 11/26/2021: reviewed Goal status: MET   3.  Patient will perform TUG </= 13 seconds to indicate improve mobility and reduced fall risk Baseline: 17 seconds 11/26/2021: 14 seconds 12/10/2021: 13 seconds Goal status: MET   LONG TERM GOALS: Target date: 01/06/2022    Patient will be I with final HEP to maintain progress from PT. Baseline: HEP provided at eval 12/31/2021: independent Goal status: MET   2.  Patient will report >/= 53% status on FOTO to indicate improved functional ability. Baseline: 45% 12/17/2021: 58% Goal status: MET   3.  Patient will demonstrate BERG >/= 45/56 in order to indicate improved balance and reduced fall risk. Baseline: 36/56 12/31/2021: 50/56 Goal status: MET   4.  Patient will perform 6MWT in >/= 1410 ft in order to indicate improved community access  Baseline: 880 ft 12/31/2021: 1160 ft Goal status: MET   5.  Patient will exhibit 5xSTS in </= 12 sec in order indicate improved LE strength and reduced fall risk Baseline: 20 sec 12/10/2021: 14 sec 12/31/2021: 9 seconds Goal status: MET     PLAN: PT FREQUENCY: -   PT DURATION: -   PLANNED INTERVENTIONS: Therapeutic exercises, Therapeutic activity, Neuromuscular re-education, Balance training, Gait training, Patient/Family education, Self Care, Joint mobilization, Joint manipulation, Aquatic Therapy, Dry Needling, Cryotherapy, Moist heat, Manual therapy, and Re-evaluation   PLAN FOR NEXT SESSION: NA 0 discharge     Hilda Blades, PT, DPT, LAT, ATC 12/31/21  10:50 AM Phone: [phone] Fax: [phone]   PHYSICAL THERAPY DISCHARGE SUMMARY  Visits from Start of Care: 8  Current functional level related to goals / functional outcomes: See above   Remaining deficits: See above   Education / Equipment: HEP   Patient agrees to discharge. Patient goals were met. Patient  is being discharged due to meeting the stated rehab goals.

## 2021-12-31 ENCOUNTER — Other Ambulatory Visit: Payer: Self-pay

## 2021-12-31 ENCOUNTER — Ambulatory Visit: Payer: Medicare Other | Admitting: Physical Therapy

## 2021-12-31 ENCOUNTER — Encounter: Payer: Self-pay | Admitting: Physical Therapy

## 2021-12-31 DIAGNOSIS — M6281 Muscle weakness (generalized): Secondary | ICD-10-CM

## 2021-12-31 DIAGNOSIS — R2689 Other abnormalities of gait and mobility: Secondary | ICD-10-CM | POA: Diagnosis not present

## 2022-01-02 NOTE — Progress Notes (Signed)
Remote ICD transmission.   

## 2022-01-03 ENCOUNTER — Other Ambulatory Visit: Payer: Self-pay | Admitting: Physician Assistant

## 2022-01-22 ENCOUNTER — Other Ambulatory Visit: Payer: Self-pay | Admitting: Physician Assistant

## 2022-02-03 ENCOUNTER — Other Ambulatory Visit: Payer: Self-pay | Admitting: Internal Medicine

## 2022-02-17 ENCOUNTER — Encounter: Payer: Self-pay | Admitting: Physician Assistant

## 2022-02-17 ENCOUNTER — Ambulatory Visit (INDEPENDENT_AMBULATORY_CARE_PROVIDER_SITE_OTHER): Payer: Medicare Other | Admitting: Physician Assistant

## 2022-02-17 VITALS — BP 100/60 | HR 57 | Resp 18 | Ht 66.0 in | Wt 162.0 lb

## 2022-02-17 DIAGNOSIS — I152 Hypertension secondary to endocrine disorders: Secondary | ICD-10-CM | POA: Diagnosis not present

## 2022-02-17 DIAGNOSIS — E119 Type 2 diabetes mellitus without complications: Secondary | ICD-10-CM | POA: Diagnosis not present

## 2022-02-17 DIAGNOSIS — E559 Vitamin D deficiency, unspecified: Secondary | ICD-10-CM | POA: Diagnosis not present

## 2022-02-17 DIAGNOSIS — E1169 Type 2 diabetes mellitus with other specified complication: Secondary | ICD-10-CM

## 2022-02-17 DIAGNOSIS — R351 Nocturia: Secondary | ICD-10-CM

## 2022-02-17 DIAGNOSIS — N401 Enlarged prostate with lower urinary tract symptoms: Secondary | ICD-10-CM

## 2022-02-17 DIAGNOSIS — E785 Hyperlipidemia, unspecified: Secondary | ICD-10-CM

## 2022-02-17 DIAGNOSIS — E1159 Type 2 diabetes mellitus with other circulatory complications: Secondary | ICD-10-CM

## 2022-02-17 DIAGNOSIS — Z23 Encounter for immunization: Secondary | ICD-10-CM

## 2022-02-17 NOTE — Patient Instructions (Signed)
Diabetes Mellitus and Skin Care Diabetes, also called diabetes mellitus, can lead to skin problems. If blood sugar (glucose) is not well controlled, it can cause problems over time. These problems include: Damage to nerves. This can affect your ability to feel wounds. This means you may not notice small skin injuries that could lead to bigger problems. This can also decrease the amount that you sweat, causing dry skin. Damage to blood vessels. The lack of blood flow can cause skin to break down. It can also slow healing time, which can lead to infections. Areas of skin that become thick or discolored. Common skin conditions There are certain skin conditions that often affect people with diabetes. These include: Dry skin. Thin skin. The skin on the feet may get thinner, break more easily, and heal more slowly than normal. Skin infections from bacteria. These include: Styes. These are infections near the eyelid. Boils. These are bumps filled with pus. Infected hair follicles. Infections of the skin around the nails. Fungal skin infections. These are most common in areas where skin rubs together, such as in the armpits or under the breasts. Common skin changes Diabetes can also cause the skin to change. You may develop: Dark, velvety markings on your skin. These may appear on your face, neck, armpits, inner thighs, and groin. Red, raised, scar-like tissue that may itch, feel painful, or become a wound. Blisters on your feet, toes, hands, or fingers. Thick, wax-like areas of skin. In most cases, these occur on the hands, forehead, or toes. Brown or red, ring-shaped or half-ring-shaped patches of skin on the ears or fingers. Pea-shaped, yellow bumps that may be itchy and have a red ring around them. This may affect your arms, feet, buttocks, and the top of your hands. Round, discolored patches of tan skin that do not hurt or itch. These may look like age spots. Supplies needed: Mild soap or  gentle skin cleanser. Lotion. How to care for dry, itchy skin Frequent high glucose levels can cause skin to become itchy. Poor blood circulation and skin infections can make dry skin worse. If you have dry, itchy skin: Avoid very hot showers and baths. Use mild soap and gentle skin cleansers. Do not use soap that is perfumed, harsh, or that dries your skin. Moisturizing soaps may help. Put on moisturizing lotion as soon as you finish bathing. Do not scratch dry skin. Scratching can expose skin to infection. If you have a rash or if your skin is very itchy, contact your health care provider. Skin that is red or covered in a rash may be a sign of an allergic reaction. Very itchy skin may mean that you need help to manage your diabetes better. You may also need treatment for an infection. General tips Most skin problems can be prevented or treated easily if caught early. Talk with your health care provider if you have any concerns. General tips include: Check your skin every day for cuts, bruises, redness, blisters, or sores, especially on your feet. If you cannot see the bottom of your feet, use a mirror or ask someone for help. Tell your health care provider if you have any of these injuries and if they are healing slowly. Keep your skin clean and dry. Do not use hot water. Moisturize your skin to prevent chapping. Keep your blood glucose levels within target range. Follow these instructions at home:  Take over-the-counter and prescription medicines only as told by your health care provider. This includes all diabetes medicines  you are taking. Schedule a foot exam with your health care provider once a year. During the exam, the structure and skin of your feet will be checked for problems. Make sure that your health care provider does a visual foot exam at every visit. If you get a skin injury, such as a cut, blister, or sore, check the area every day for signs of infection. Check for: Redness,  swelling, or pain. Fluid or blood. Warmth. Pus or a bad smell. Do not use any products that contain nicotine or tobacco. These products include cigarettes, chewing tobacco, and vaping devices, such as e-cigarettes. If you need help quitting, ask your health care provider. Where to find more information American Diabetes Association: diabetes.org Association of Diabetes Care & Education Specialists: diabeteseducator.org Contact a health care provider if: You get a cut or sore, especially on your feet. You have signs of infection after a skin injury. You have itchy skin that turns red or develops a rash. You have discolored areas of skin. You have places on your skin that change. They may thicken or appear shiny. This information is not intended to replace advice given to you by your health care provider. Make sure you discuss any questions you have with your health care provider. Document Revised: 09/25/2021 Document Reviewed: 09/25/2021 Elsevier Patient Education  [place].

## 2022-02-17 NOTE — Progress Notes (Signed)
Established patient visit   Patient: Jacob Harrison   DOB: 01-Oct-1940   81 y.o. Male  MRN: [id number] Visit Date: 02/17/2022  Chief Complaint  Patient presents with   Follow-up    fasting   Diabetes   Subjective    HPI HPI     Follow-up    Additional comments: fasting      Last edited by Gemma Payor, CMA on 02/17/2022  9:55 AM.      Patient presents for chronic follow-up visit.  Diabetes: No increased urination or thirst from baseline. Pt reports medication compliance. No hypoglycemic events. Checking glucose at home few times per week. FBS range from 100-115. Reports limits sugars.   HTN: No chest pain, palpitations, dizziness or lower extremity swelling. Taking medication as directed without side effects. Reports controlled salt intake as best he can. Trying to read labels. Does not eat processed foods or cured meats.  HLD: Pt taking medication as directed without issues. Denies side effects including myalgias and RUQ pain.   BPH: Reports medication compliance with finasteride 5 mg daily. Denies increased nocturia or urinary frequency. No dysuria.   Medications: Outpatient Medications Prior to Visit  Medication Sig   acetaminophen (TYLENOL) 500 MG tablet Take 1,000 mg by mouth every 6 (six) hours as needed for moderate pain or headache.   aspirin EC 81 MG tablet Take 1 tablet (81 mg total) by mouth daily.   carvedilol (COREG) 12.5 MG tablet TAKE 1 TABLET (12.5MG TOTAL) BY MOUTH TWICE A DAY WITH MEALS   Cholecalciferol (VITAMIN D-3 PO) Take 800 Units by mouth daily.   clopidogrel (PLAVIX) 75 MG tablet Take 1 tablet (75 mg total) by mouth daily.   Coenzyme Q10 (CO Q-10) 200 MG CAPS Take 200 mg by mouth daily.    cyanocobalamin 500 MCG tablet Take 500 mcg by mouth daily.   diclofenac Sodium (VOLTAREN) 1 % GEL Apply 2 g topically 2 (two) times daily as needed.   empagliflozin (JARDIANCE) 10 MG TABS tablet Take 1 tablet (10 mg total) by mouth daily before  breakfast.   ezetimibe (ZETIA) 10 MG tablet TAKE 1 TABLET BY MOUTH EVERY DAY   finasteride (PROSCAR) 5 MG tablet TAKE 1 TABLET BY MOUTH EVERY DAY   furosemide (LASIX) 40 MG tablet TAKE 1 TABLET BY MOUTH EVERY DAY   Glucosamine-Chondroit-Vit C-Mn (GLUCOSAMINE 1500 COMPLEX) CAPS Take 1 capsule by mouth daily.   isosorbide mononitrate (IMDUR) 30 MG 24 hr tablet TAKE 30 MG (1 TABLET) IN THE MORNING AND 15 MG (1/2 TABLET) IN THE EVENING.   Lancets (ONETOUCH ULTRASOFT) lancets Use to check blood sugars fasting daily   losartan (COZAAR) 25 MG tablet Take 0.5 tablets (12.5 mg total) by mouth daily.   metFORMIN (GLUCOPHAGE) 500 MG tablet 1/2 TABLET BY MOUTH DAILY WITH FOOD   Multiple Vitamins-Minerals (PRESERVISION AREDS 2 PO) Take 1 capsule by mouth 2 (two) times a day.   nitroGLYCERIN (NITROSTAT) 0.4 MG SL tablet PLACE 1 TABLET UNDER THE TONGUE EVERY 5 MINUTES AS NEEDED.   ranolazine (RANEXA) 1000 MG SR tablet TAKE 1 TABLET BY MOUTH TWICE A DAY   rosuvastatin (CRESTOR) 20 MG tablet TAKE 1 TABLET BY MOUTH EVERYDAY AT BEDTIME   No facility-administered medications prior to visit.    Review of Systems Review of Systems:  A fourteen system review of systems was performed and found to be positive as per HPI.  Last CBC Lab Results  Component Value Date   WBC 10.8 10/23/2021  HGB 12.8 (L) 10/23/2021   HCT 38.8 10/23/2021   MCV 97 10/23/2021   MCH 32.1 10/23/2021   RDW 13.1 10/23/2021   PLT 234 06/26/2246   Last metabolic panel Lab Results  Component Value Date   GLUCOSE 96 10/23/2021   NA 137 10/23/2021   K 4.4 10/23/2021   CL 98 10/23/2021   CO2 26 10/23/2021   BUN 13 10/23/2021   CREATININE 0.88 10/23/2021   EGFR 88 10/23/2021   CALCIUM 9.5 10/23/2021   PROT 6.7 10/23/2021   ALBUMIN 4.8 10/23/2021   LABGLOB 1.9 10/23/2021   AGRATIO 2.5 (H) 10/23/2021   BILITOT 0.7 10/23/2021   ALKPHOS 89 10/23/2021   AST 22 10/23/2021   ALT 18 10/23/2021   ANIONGAP 8 08/13/2021   Last  lipids Lab Results  Component Value Date   CHOL 136 10/23/2021   HDL 69 10/23/2021   LDLCALC 51 10/23/2021   TRIG 81 10/23/2021   CHOLHDL 2.0 10/23/2021   Last hemoglobin A1c Lab Results  Component Value Date   HGBA1C 5.1 10/22/2021   Last thyroid functions Lab Results  Component Value Date   TSH 2.180 08/31/2020       Objective    BP 100/60 (BP Location: Left Arm, Patient Position: Sitting, Cuff Size: Normal)   Pulse (!) 57   Resp 18   Ht _0  (1.676 m)   Wt 162 lb (73.5 kg)   SpO2 99%   BMI 26.15 kg/m  BP Readings from Last 3 Encounters:  02/17/22 100/60  12/11/21 110/68  11/09/21 (!) 97/58   Wt Readings from Last 3 Encounters:  02/17/22 162 lb (73.5 kg)  12/11/21 162 lb (73.5 kg)  10/22/21 159 lb 14.4 oz (72.5 kg)    Physical Exam  General:  Pleasant and cooperative, appropriate for stated age.  Neuro:  Alert and oriented,  extra-ocular muscles intact  HEENT:  Normocephalic, atraumatic, neck supple  Skin:  no gross rash, warm, pink. Cardiac:  RRR, S1 S2, + systolic murmur Respiratory: CTA B/L  Vascular:  Ext warm, no cyanosis apprec.; trace of pitting edema b/l Psych:  No HI/SI, judgement and insight good, Euthymic mood. Full Affect.   No results found for any visits on 02/17/22.  Assessment & Plan      Problem List Items Addressed This Visit       Endocrine   Type 2 diabetes mellitus without complication, without long-term current use of insulin (HCC) - Primary (Chronic)    -Last A1c 5.1, will repeat today. Patient recently started on Jardiance 10 mg daily by cardiology for CHF tx. Discussed with patient if A1c remains controlled then will discontinue Metformin. Pt verbalized understanding. Recommend to continue with low carbohydrate/glucose diet and monitoring fasting sugar at home.        Relevant Orders   HgB A1c   Hyperlipidemia associated with type 2 diabetes mellitus (HCC) (Chronic)    -Controlled. Last LDL 51 (goal<70). Will repeat lipid  panel and hepatic function today. Continue Zetia 10 mg daily and rosuvastatin 20 mg daily.      Relevant Orders   Lipid Profile     Other   BPH associated with nocturia (Chronic)    -Stable. Continue current medication regimen.      Vitamin D deficiency   Relevant Orders   Vitamin D (25 hydroxy)   Other Visit Diagnoses     Need for influenza vaccination       Relevant Orders   Flu Vaccine QUAD High  Dose(Fluad)   Hypertension associated with diabetes ([facility])       Relevant Orders   Comp Met (CMET)   CBC w/Diff      Hypertension associated with diabetes: -Soft blood pressure today. BP at cardiology 12/11/2021 visit 110/68. Pt is fasting this morning so recommend to continue to monitor and repeat BP at follow-up visit. On Losartan 12.5 mg daily and carvedilol 12.5 mg twice daily. Will collect CMP for medication monitoring.  Return in about 3 months (around 05/20/2022) for DM, HTN, HLD.        [name], PA-C  [facility] [phone] (phone) [phone] (fax)  [place]

## 2022-02-17 NOTE — Assessment & Plan Note (Signed)
-  Last A1c 5.1, will repeat today. Patient recently started on Jardiance 10 mg daily by cardiology for CHF tx. Discussed with patient if A1c remains controlled then will discontinue Metformin. Pt verbalized understanding. Recommend to continue with low carbohydrate/glucose diet and monitoring fasting sugar at home.

## 2022-02-17 NOTE — Assessment & Plan Note (Addendum)
-  Controlled. Last LDL 51 (goal<70). Will repeat lipid panel and hepatic function today. Continue Zetia 10 mg daily and rosuvastatin 20 mg daily.

## 2022-02-17 NOTE — Assessment & Plan Note (Signed)
-  Stable. Continue current medication regimen. 

## 2022-02-18 ENCOUNTER — Other Ambulatory Visit: Payer: Self-pay | Admitting: Physician Assistant

## 2022-02-18 DIAGNOSIS — N401 Enlarged prostate with lower urinary tract symptoms: Secondary | ICD-10-CM

## 2022-02-18 LAB — CBC WITH DIFFERENTIAL/PLATELET
Basophils Absolute: 0 10*3/uL (ref 0.0–0.2)
Basos: 0 %
EOS (ABSOLUTE): 0.1 10*3/uL (ref 0.0–0.4)
Eos: 1 %
Hematocrit: 40.6 % (ref 37.5–51.0)
Hemoglobin: 13.8 g/dL (ref 13.0–17.7)
Immature Grans (Abs): 0 10*3/uL (ref 0.0–0.1)
Immature Granulocytes: 0 %
Lymphocytes Absolute: 2.9 10*3/uL (ref 0.7–3.1)
Lymphs: 25 %
MCH: 32.7 pg (ref 26.6–33.0)
MCHC: 34 g/dL (ref 31.5–35.7)
MCV: 96 fL (ref 79–97)
Monocytes Absolute: 1.1 10*3/uL — ABNORMAL HIGH (ref 0.1–0.9)
Monocytes: 9 %
Neutrophils Absolute: 7.3 10*3/uL — ABNORMAL HIGH (ref 1.4–7.0)
Neutrophils: 65 %
Platelets: 224 10*3/uL (ref 150–450)
RBC: 4.22 x10E6/uL (ref 4.14–5.80)
RDW: 13.4 % (ref 11.6–15.4)
WBC: 11.5 10*3/uL — ABNORMAL HIGH (ref 3.4–10.8)

## 2022-02-18 LAB — LIPID PANEL
Chol/HDL Ratio: 1.9 ratio (ref 0.0–5.0)
Cholesterol, Total: 139 mg/dL (ref 100–199)
HDL: 73 mg/dL (ref >39)
LDL Chol Calc (NIH): 49 mg/dL (ref 0–99)
Triglycerides: 91 mg/dL (ref 0–149)
VLDL Cholesterol Cal: 17 mg/dL (ref 5–40)

## 2022-02-18 LAB — COMPREHENSIVE METABOLIC PANEL
ALT: 23 IU/L (ref 0–44)
AST: 24 IU/L (ref 0–40)
Albumin/Globulin Ratio: 2.6 — ABNORMAL HIGH (ref 1.2–2.2)
Albumin: 4.7 g/dL (ref 3.8–4.8)
Alkaline Phosphatase: 83 IU/L (ref 44–121)
BUN/Creatinine Ratio: 16 (ref 10–24)
BUN: 16 mg/dL (ref 8–27)
Bilirubin Total: 0.9 mg/dL (ref 0.0–1.2)
CO2: 27 mmol/L (ref 20–29)
Calcium: 9.5 mg/dL (ref 8.6–10.2)
Chloride: 96 mmol/L (ref 96–106)
Creatinine, Ser: 1.03 mg/dL (ref 0.76–1.27)
Globulin, Total: 1.8 g/dL (ref 1.5–4.5)
Glucose: 92 mg/dL (ref 70–99)
Potassium: 4.1 mmol/L (ref 3.5–5.2)
Sodium: 138 mmol/L (ref 134–144)
Total Protein: 6.5 g/dL (ref 6.0–8.5)
eGFR: 74 mL/min/{1.73_m2} (ref >59)

## 2022-02-18 LAB — HEMOGLOBIN A1C
Est. average glucose Bld gHb Est-mCnc: 108 mg/dL
Hgb A1c MFr Bld: 5.4 % (ref 4.8–5.6)

## 2022-02-18 LAB — VITAMIN D 25 HYDROXY (VIT D DEFICIENCY, FRACTURES): Vit D, 25-Hydroxy: 35.3 ng/mL (ref 30.0–100.0)

## 2022-03-02 ENCOUNTER — Other Ambulatory Visit: Payer: Self-pay | Admitting: Internal Medicine

## 2022-03-13 ENCOUNTER — Ambulatory Visit: Payer: Medicare Other | Admitting: Internal Medicine

## 2022-03-13 ENCOUNTER — Other Ambulatory Visit: Payer: Self-pay | Admitting: Internal Medicine

## 2022-03-18 LAB — CUP PACEART REMOTE DEVICE CHECK
Battery Remaining Longevity: 125 mo
Battery Voltage: 3.04 V
Brady Statistic RV Percent Paced: 0.13 %
Date Time Interrogation Session: 20231211102639
HighPow Impedance: 66 Ohm
Implantable Lead Connection Status: 753985
Implantable Lead Implant Date: 20221214
Implantable Lead Location: 753860
Implantable Pulse Generator Implant Date: 20221214
Lead Channel Impedance Value: 266 Ohm
Lead Channel Impedance Value: 437 Ohm
Lead Channel Pacing Threshold Amplitude: 0.875 V
Lead Channel Pacing Threshold Pulse Width: 0.4 ms
Lead Channel Sensing Intrinsic Amplitude: 12.625 mV
Lead Channel Sensing Intrinsic Amplitude: 12.625 mV
Lead Channel Setting Pacing Amplitude: 1.5 V
Lead Channel Setting Pacing Pulse Width: 0.4 ms
Lead Channel Setting Sensing Sensitivity: 0.3 mV
Zone Setting Status: 755011
Zone Setting Status: 755011

## 2022-03-19 ENCOUNTER — Ambulatory Visit (INDEPENDENT_AMBULATORY_CARE_PROVIDER_SITE_OTHER): Payer: Medicare Other

## 2022-03-19 DIAGNOSIS — I255 Ischemic cardiomyopathy: Secondary | ICD-10-CM | POA: Diagnosis not present

## 2022-03-24 ENCOUNTER — Other Ambulatory Visit: Payer: Self-pay | Admitting: Internal Medicine

## 2022-03-24 DIAGNOSIS — I25118 Atherosclerotic heart disease of native coronary artery with other forms of angina pectoris: Secondary | ICD-10-CM

## 2022-04-11 ENCOUNTER — Encounter: Payer: Self-pay | Admitting: Internal Medicine

## 2022-04-11 ENCOUNTER — Ambulatory Visit: Payer: Medicare HMO | Attending: Internal Medicine | Admitting: Internal Medicine

## 2022-04-11 VITALS — BP 118/62 | HR 62 | Ht 66.0 in | Wt 162.0 lb

## 2022-04-11 DIAGNOSIS — E1169 Type 2 diabetes mellitus with other specified complication: Secondary | ICD-10-CM

## 2022-04-11 DIAGNOSIS — I255 Ischemic cardiomyopathy: Secondary | ICD-10-CM | POA: Diagnosis not present

## 2022-04-11 DIAGNOSIS — I200 Unstable angina: Secondary | ICD-10-CM

## 2022-04-11 DIAGNOSIS — R0602 Shortness of breath: Secondary | ICD-10-CM | POA: Diagnosis not present

## 2022-04-11 DIAGNOSIS — I5022 Chronic systolic (congestive) heart failure: Secondary | ICD-10-CM | POA: Diagnosis not present

## 2022-04-11 DIAGNOSIS — E785 Hyperlipidemia, unspecified: Secondary | ICD-10-CM | POA: Diagnosis not present

## 2022-04-11 MED ORDER — ISOSORBIDE MONONITRATE ER 30 MG PO TB24: 30.0000 mg | ORAL_TABLET | Freq: Two times a day (BID) | ORAL | 3 refills | Status: DC

## 2022-04-11 NOTE — Patient Instructions (Signed)
Medication Instructions:  Your physician recommends the following medication changes.  INCREASE: Imdur to 30 mg by mouth twice a day  *If you need a refill on your cardiac medications before your next appointment, please call your pharmacy*   Lab Work: None ordered today   Testing/Procedures: Your physician has requested that you have an echocardiogram. Echocardiography is a painless test that uses sound waves to create images of your heart. It provides your doctor with information about the size and shape of your heart and how well your heart's chambers and valves are working.   You may receive an ultrasound enhancing agent through an IV if needed to better visualize your heart during the echo. This procedure takes approximately one hour.  There are no restrictions for this procedure.  This will take place at [facility] [id number], [place]    Follow-Up: At [facility], you and your health needs are our priority.  As part of our continuing mission to provide you with exceptional heart care, we have created designated Provider Care Teams.  These Care Teams include your primary Cardiologist (physician) and Advanced Practice Providers (APPs -  Physician Assistants and Nurse Practitioners) who all work together to provide you with the care you need, when you need it.  We recommend signing up for the patient portal called "MyChart".  Sign up information is provided on this After Visit Summary.  MyChart is used to connect with patients for Virtual Visits (Telemedicine).  Patients are able to view lab/test results, encounter notes, upcoming appointments, etc.  Non-urgent messages can be sent to your provider as well.   To learn more about what you can do with MyChart, go to [email or web address].    Your next appointment:   1 month(s)  The format for your next appointment:   In Person  Provider:   You may see Nelva Bush, MD or one of  the following Advanced Practice Providers on your designated Care Team:   Murray Hodgkins, NP Christell Faith, PA-C Cadence Kathlen Mody, PA-C Gerrie Nordmann, NP

## 2022-04-11 NOTE — Progress Notes (Signed)
Follow-up Outpatient Visit Date: 04/11/2022  Primary Care Provider: [name], PA-C No address on file  Chief Complaint: Shortness of breath with activity  HPI:  Jacob Harrison is a 82 y.o. 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  I last saw Jacob Harrison in September, at which time he was feeling fairly well though he noted some fatigue towards the Pati Thinnes of the day.  He was also concerned about occasional headaches and mildly elevated blood pressure with associated shortness of breath.  We agreed to add empagliflozin.  Blood pressure was actually low normal when he followed up with endocrinology in November (100/60).  Today, Jacob Harrison reports that he has noticed more exertional dyspnea over the last 6 months.  He now sometimes has to stop when walking only a few minutes due to dyspnea as well as vague tightness in his chest.  He often rests and takes a nitroglycerin if the symptoms do not abate after about 5 minutes.  Nitroglycerin typically resolves the symptoms almost immediately.  He denies palpitations and lightheadedness.  His home blood pressures are typically around 100/55.  He notes mild dependent edema in his left foot, which has been longstanding.  He initially felt like Jardiance was making him feel better, though over the last couple of months he has not felt much improvement.  --------------------------------------------------------------------------------------------------  Cardiovascular History & Procedures: Cardiovascular Problems: Coronary artery disease Ischemic cardiomyopathy Heart failure with preserved ejection fraction (HFpEF)   Risk Factors: Known coronary artery disease, hypertension,  hyperlipidemia, diabetes mellitus, male gender, obesity, and age > 17   Cath/PCI: LHC (10/04/2020): LMCA with mild diffuse disease.  LAD with moderate diffuse proximal disease and chronic subtotal occlusion of the mid vessel.  Distal LAD fills via left to left and right to left collaterals.  LCx with moderate mid vessel disease.  RCA chronically occluded proximally, filling via left to right collaterals.  SVGs to RPDA, LAD, and OM are chronically occluded.  LIMA to LAD known to be atretic.  LVEDP 22 mmHg. LHC (10/18/12, [place], [place]): LMCA 20-30% ostial/proximal stenosis. LAD with sequential 100% and 95-99% mid stenoses. LCx with 40-50% mid stenosis. RCA with 100% proximal and 80-100% sequential distal lesions. RPDA and rPL1 have 99% and 95% stenoses, respectively. SVG->LAD and SVG->rPDA are patent. LVEF 67% with normal wall motion. LHC (04/05/10, [place], [place]): LMCA with 30% mid stenosis. LAD with 100% midvessel occlution. LCx with 40% mid stenosis. RCA with 100% proximal stenosis. LIMA->LAD atretic, SVG->LCx occluded, and SVG-> RCA with 80% ostial and 70% distal stenoses.   CV Surgery: Redo CABG (04/2010, [place], [place]): SVG->LAD and SVG->rPDA. CABG (2001, [place]): LIMA->LAD, SVG->LCx, and SVG->rPDA   EP Procedures and Devices: Medtronic single-chamber ICD (03/20/2021, Dr. Quentin Ore)   Non-Invasive Evaluation(s): Limited TTE (02/20/2021): LVEF 30-35%.  Mild mitral regurgitation.  Aortic sclerosis without stenosis. TTE (11/27/2020): Normal LV size with mild LVH.  LVEF 30-35% with extensive wall motion abnormalities.  Grade 3 diastolic dysfunction with elevated filling pressure noted.  Normal RV size and function.  Mild left atrial enlargement.  Degenerative mitral valve with mild thickening and mild-moderate MR.  Mild aortic stenosis noted with valve area 2 cm and mean gradient 9 mmHg. TTE (09/23/2019): Normal LV size  and wall thickness.  LVEF 60-65% with grade 1  diastolic dysfunction.  Normal RV size and function.  No atrial enlargement.  Aortic sclerosis without stenosis.  Normal CVP. Pharmacologic MPI (11/06/16): Low risk study without ischemia or scar. LVEF 48% (calculated) but visually appears normal. TTE (10/21/16): Normal LV size with mild LVH. LVEF 65-70% with normal diastolic function. Mild LA enlargement. Normal RV size and function. ABIs (03/08/15): Right 1.25, left 1.26; no significant change with exercise. Exercise myocardial perfusion stress test (10/07/12): Small to moderate in size, mild to moderate in severity, partially reversible defect involving the mid inferior and inferolateral segments. LVEF 65%.  Recent CV Pertinent Labs: Lab Results  Component Value Date   CHOL 139 02/17/2022   HDL 73 02/17/2022   LDLCALC 49 02/17/2022   TRIG 91 02/17/2022   CHOLHDL 1.9 02/17/2022   CHOLHDL 2.7 11/14/2016   BNP 599.9 (H) 10/25/2020   BNP 695.5 (H) 10/09/2020   K 4.1 02/17/2022   MG 1.9 02/25/2019   BUN 16 02/17/2022   CREATININE 1.03 02/17/2022    Past medical and surgical history were reviewed and updated in EPIC.  Current Meds  Medication Sig   acetaminophen (TYLENOL) 500 MG tablet Take 1,000 mg by mouth every 6 (six) hours as needed for moderate pain or headache.   aspirin EC 81 MG tablet Take 1 tablet (81 mg total) by mouth daily.   carvedilol (COREG) 12.5 MG tablet TAKE 1 TABLET BY MOUTH TWICE A DAY WITH MEALS   Cholecalciferol (VITAMIN D-3 PO) Take 800 Units by mouth daily.   clopidogrel (PLAVIX) 75 MG tablet Take 1 tablet (75 mg total) by mouth daily.   Coenzyme Q10 (CO Q-10) 200 MG CAPS Take 200 mg by mouth daily.    cyanocobalamin 500 MCG tablet Take 500 mcg by mouth daily.   diclofenac Sodium (VOLTAREN) 1 % GEL Apply 2 g topically 2 (two) times daily as needed.   empagliflozin (JARDIANCE) 10 MG TABS tablet Take 1 tablet (10 mg total) by mouth daily before breakfast.   ezetimibe (ZETIA) 10 MG tablet TAKE 1 TABLET BY MOUTH EVERY  DAY   finasteride (PROSCAR) 5 MG tablet TAKE 1 TABLET BY MOUTH EVERY DAY   furosemide (LASIX) 40 MG tablet TAKE 1 TABLET BY MOUTH EVERY DAY   Glucosamine-Chondroit-Vit C-Mn (GLUCOSAMINE 1500 COMPLEX) CAPS Take 1 capsule by mouth daily.   isosorbide mononitrate (IMDUR) 30 MG 24 hr tablet TAKE 30 MG (1 TABLET) IN THE MORNING AND 15 MG (1/2 TABLET) IN THE EVENING.   Lancets (ONETOUCH ULTRASOFT) lancets Use to check blood sugars fasting daily   losartan (COZAAR) 25 MG tablet Take 0.5 tablets (12.5 mg total) by mouth daily.   metFORMIN (GLUCOPHAGE) 500 MG tablet 1/2 TABLET BY MOUTH DAILY WITH FOOD   Multiple Vitamins-Minerals (PRESERVISION AREDS 2 PO) Take 1 capsule by mouth 2 (two) times a day.   nitroGLYCERIN (NITROSTAT) 0.4 MG SL tablet PLACE 1 TABLET UNDER THE TONGUE EVERY 5 MINUTES AS NEEDED.   ranolazine (RANEXA) 1000 MG SR tablet TAKE 1 TABLET BY MOUTH TWICE A DAY   rosuvastatin (CRESTOR) 20 MG tablet TAKE 1 TABLET BY MOUTH EVERYDAY AT BEDTIME    Allergies: Other and Tetracyclines & related  Social History   Tobacco Use   Smoking status: Never   Smokeless tobacco: Never  Vaping Use   Vaping Use: Never used  Substance Use Topics   Alcohol use: Yes    Alcohol/week: 11.0 standard drinks of alcohol  Types: 5 Glasses of wine, 6 Cans of beer per week    Comment: 1-2 drinks on occasion previously 5-7 drinks a week (was 10-15 drinks/week)   Drug use: No    Family History  Problem Relation Age of Onset   Diabetes Mother    Hyperlipidemia Mother    Cancer Father        colon   Skin cancer Brother    Alcohol abuse Maternal Uncle    Diabetes Maternal Uncle    Hyperlipidemia Maternal Uncle    Cancer Paternal Uncle        colon    Review of Systems: A 12-system review of systems was performed and was negative except as noted in the HPI.  --------------------------------------------------------------------------------------------------  Physical Exam: BP 118/62 (BP Location:  Left Arm, Patient Position: Sitting, Cuff Size: Normal)   Pulse 62   Ht '5\' 6"'$  (1.676 m)   Wt 162 lb (73.5 kg)   SpO2 98%   BMI 26.15 kg/m   General:  NAD. Neck: No JVD or HJR. Lungs: Clear to auscultation bilaterally without wheezes or crackles. Heart: Regular rate and rhythm with 2/6 systolic murmur.  No rubs or gallops. Abdomen: Soft, nontender, nondistended. Extremities: No lower extremity edema.  EKG: Normal sinus rhythm with inferior and anterolateral infarcts.  No significant change from prior tracing on 09/04/2021.  Lab Results  Component Value Date   WBC 11.5 (H) 02/17/2022   HGB 13.8 02/17/2022   HCT 40.6 02/17/2022   MCV 96 02/17/2022   PLT 224 02/17/2022    Lab Results  Component Value Date   NA 138 02/17/2022   K 4.1 02/17/2022   CL 96 02/17/2022   CO2 27 02/17/2022   BUN 16 02/17/2022   CREATININE 1.03 02/17/2022   GLUCOSE 92 02/17/2022   ALT 23 02/17/2022    Lab Results  Component Value Date   CHOL 139 02/17/2022   HDL 73 02/17/2022   LDLCALC 49 02/17/2022   TRIG 91 02/17/2022   CHOLHDL 1.9 02/17/2022    --------------------------------------------------------------------------------------------------  ASSESSMENT AND PLAN: Coronary artery disease with accelerating angina: Jacob Harrison denies frank chest pain but has experienced more exertional dyspnea and associated mild tightness in the chest over the last 6 months.  It is unclear if his symptoms are due to progression of his CAD or heart failure (or combination of both).  Unfortunately, his soft blood pressure precludes aggressive escalation of his antianginal therapy.  We have, however, agreed to increase isosorbide mononitrate to 30 mg twice daily and obtain an echocardiogram to reassess his LVEF.  If symptoms do not improve and/or LVEF has declined from 30-35% on last check, will need to consider repeating cardiac catheterization.  I have personally reviewed his last catheterization in 2022, which  showed failure of all grafts and severe native coronary disease.  If his coronary anatomy were similar on repeat catheterization, we could consider viability study and consultation with the CTO team to see if PCI to the CTO's of the mid LAD and proximal RCA would be amenable to revascularization.  Continue aspirin and clopidogrel for secondary prevention as well as rosuvastatin and ezetimibe.  LDL at goal on last check in November.  Chronic HFrEF due to ischemic cardiomyopathy and shortness of breath: Jacob Harrison appears euvolemic on exam but notes progressive exertional dyspnea over the last 6 months.  We will repeat an echocardiogram and defer changing his goal-directed medical therapy today.  We had previously discussed Entresto in place of losartan, but  given his borderline low blood pressures at home (typically 100/55) I am reluctant to do this today.  Continue ICD follow-up with Dr. Quentin Ore and the device clinic.  If symptoms do not improve, consultation with advanced heart failure team will need to be considered.  Hyperlipidemia associated with type 2 diabetes mellitus: Lipids well-controlled on last check.  Continue current doses of rosuvastatin and ezetimibe.  Ongoing management of DM per endocrinology.  Follow-up: Return to clinic in 1 month.  Nelva Bush, MD 04/11/2022 3:48 PM

## 2022-04-12 ENCOUNTER — Other Ambulatory Visit: Payer: Self-pay | Admitting: Internal Medicine

## 2022-04-14 ENCOUNTER — Other Ambulatory Visit: Payer: Self-pay | Admitting: Internal Medicine

## 2022-04-15 NOTE — Progress Notes (Signed)
Remote ICD transmission.   

## 2022-04-17 ENCOUNTER — Telehealth: Payer: Self-pay

## 2022-04-17 ENCOUNTER — Other Ambulatory Visit: Payer: Self-pay

## 2022-04-17 DIAGNOSIS — E118 Type 2 diabetes mellitus with unspecified complications: Secondary | ICD-10-CM

## 2022-04-17 MED ORDER — METFORMIN HCL 500 MG PO TABS: ORAL_TABLET | ORAL | 0 refills | Status: DC

## 2022-04-17 NOTE — Telephone Encounter (Signed)
Pt is requesting a refill on: metFORMIN (GLUCOPHAGE) 500 MG table   Pharmacy: [id number]- [place], [place] - [address]   LOV 02/17/22 ROV 05/20/22

## 2022-04-17 NOTE — Telephone Encounter (Signed)
[facility] [place]

## 2022-04-21 ENCOUNTER — Ambulatory Visit: Payer: Medicare HMO | Attending: Internal Medicine

## 2022-04-21 ENCOUNTER — Other Ambulatory Visit: Payer: Self-pay | Admitting: Nurse Practitioner

## 2022-04-21 DIAGNOSIS — R0602 Shortness of breath: Secondary | ICD-10-CM | POA: Diagnosis not present

## 2022-04-21 DIAGNOSIS — I5022 Chronic systolic (congestive) heart failure: Secondary | ICD-10-CM

## 2022-04-21 LAB — ECHOCARDIOGRAM COMPLETE
AR max vel: 1.33 cm2
AV Area VTI: 1.53 cm2
AV Area mean vel: 1.31 cm2
AV Mean grad: 11 mmHg
AV Peak grad: 17.1 mmHg
Ao pk vel: 2.07 m/s
Area-P 1/2: 3.24 cm2
S' Lateral: 3.8 cm

## 2022-04-24 ENCOUNTER — Telehealth: Payer: Self-pay

## 2022-04-24 ENCOUNTER — Telehealth: Payer: Self-pay | Admitting: Internal Medicine

## 2022-04-24 NOTE — Telephone Encounter (Signed)
Attempted to call. No answer- I left a detailed message of results on his identified cell VM (ok per DPR). I asked that he call back with any questions or concerns prior to his scheduled follow up on 05/22/22.   Results also released to [place].

## 2022-04-24 NOTE — Patient Outreach (Signed)
  Care Coordination   04/24/2022 Name: Jacob Harrison MRN: [id number] DOB: 11-Jan-1941   Care Coordination Outreach Attempts:  An unsuccessful telephone outreach was attempted today to offer the patient information about available care coordination services as a benefit of their health plan.   Follow Up Plan:  Additional outreach attempts will be made to offer the patient care coordination information and services.   Encounter Outcome:  No Answer   Care Coordination Interventions:  No, not indicated    Jone Baseman, RN, MSN [facility] Care Management Coordinator Direct Line [phone]

## 2022-04-24 NOTE — Telephone Encounter (Signed)
Nelva Bush, MD 04/23/2022  8:54 AM EST     Please let Mr. Petro know that his echocardiogram is similar to the prior study in 2022.  His heart pumping function remains moderately to severely reduced with an ejection fraction of 30-35%.  Aortic valve is slightly more narrowed but still shows only mild stenosis.  I recommend that Mr. Stcyr continue his current medications and follow-up with [place] as scheduled next month to reassess his symptoms.

## 2022-05-08 ENCOUNTER — Telehealth: Payer: Self-pay

## 2022-05-08 NOTE — Patient Outreach (Signed)
  Care Coordination   Initial Visit Note   05/08/2022 Name: Jacob Harrison MRN: [id number] DOB: 04/18/1940  Jacob Harrison is a 82 y.o. year old male who sees Jacob Harrison, [place] for primary care. I spoke with  Jacob Harrison by phone today.  What matters to the patients health and wellness today?  Low back pain from back injury.  Patient using heat and tylenol.  Advised that if pain persist or gets worse to contact PCP.  He verbalized understanding.        Goals Addressed             This Visit's Progress    COMPLETED: Care Coordination Activities- No follow up required       Interventions Today    Flowsheet Row Most Recent Value  Chronic Disease Discussed/Reviewed   Chronic disease discussed/reviewed during today's visit Diabetes, Hypertension (HTN)  General Interventions   General Interventions Discussed/Reviewed General Interventions Discussed, Labs, Annual Eye Exam  Labs Hgb A1c every 3 months  Exercise Interventions   Exercise Discussed/Reviewed Exercise Discussed  Education Interventions   Education Provided Provided Verbal Education  Provided Verbal Education On Blood Sugar Monitoring, Labs, Nutrition  Nutrition Interventions   Nutrition Discussed/Reviewed Nutrition Discussed              SDOH assessments and interventions completed:  Yes  SDOH Interventions Today    Flowsheet Row Most Recent Value  SDOH Interventions   Housing Interventions Intervention Not Indicated  Transportation Interventions Intervention Not Indicated  Utilities Interventions Intervention Not Indicated        Care Coordination Interventions:  Yes, provided   Follow up plan: No further intervention required.   Encounter Outcome:  Pt. Visit Completed   Jacob Baseman, RN, MSN [facility] Care Management Coordinator Direct Line [phone]

## 2022-05-08 NOTE — Patient Instructions (Signed)
Visit Information  Thank you for taking time to visit with me today. Please don't hesitate to contact me if I can be of assistance to you.   Following are the goals we discussed today:   Goals Addressed             This Visit's Progress    COMPLETED: Care Coordination Activities- No follow up required       Interventions Today    Flowsheet Row Most Recent Value  Chronic Disease Discussed/Reviewed   Chronic disease discussed/reviewed during today's visit Diabetes, Hypertension (HTN)  General Interventions   General Interventions Discussed/Reviewed General Interventions Discussed, Labs, Annual Eye Exam  Labs Hgb A1c every 3 months  Exercise Interventions   Exercise Discussed/Reviewed Exercise Discussed  Education Interventions   Education Provided Provided Verbal Education  Provided Verbal Education On Blood Sugar Monitoring, Labs, Nutrition  Nutrition Interventions   Nutrition Discussed/Reviewed Nutrition Discussed               If you are experiencing a Mental Health or [place] or need someone to talk to, please call the Suicide and Crisis Lifeline: 988   Patient verbalizes understanding of instructions and care plan provided today and agrees to view in [place]. Active MyChart status and patient understanding of how to access instructions and care plan via MyChart confirmed with patient.     No further follow up required: Decline  Jone Baseman, RN, MSN [facility] Care Management Coordinator Direct Line [phone]

## 2022-05-12 DIAGNOSIS — M47895 Other spondylosis, thoracolumbar region: Secondary | ICD-10-CM | POA: Diagnosis not present

## 2022-05-12 DIAGNOSIS — M4727 Other spondylosis with radiculopathy, lumbosacral region: Secondary | ICD-10-CM | POA: Diagnosis not present

## 2022-05-12 DIAGNOSIS — M9904 Segmental and somatic dysfunction of sacral region: Secondary | ICD-10-CM | POA: Diagnosis not present

## 2022-05-12 DIAGNOSIS — M9902 Segmental and somatic dysfunction of thoracic region: Secondary | ICD-10-CM | POA: Diagnosis not present

## 2022-05-12 DIAGNOSIS — M4726 Other spondylosis with radiculopathy, lumbar region: Secondary | ICD-10-CM | POA: Diagnosis not present

## 2022-05-12 DIAGNOSIS — M9903 Segmental and somatic dysfunction of lumbar region: Secondary | ICD-10-CM | POA: Diagnosis not present

## 2022-05-13 ENCOUNTER — Encounter: Payer: Self-pay | Admitting: Cardiology

## 2022-05-14 DIAGNOSIS — M9903 Segmental and somatic dysfunction of lumbar region: Secondary | ICD-10-CM | POA: Diagnosis not present

## 2022-05-14 DIAGNOSIS — M4727 Other spondylosis with radiculopathy, lumbosacral region: Secondary | ICD-10-CM | POA: Diagnosis not present

## 2022-05-15 ENCOUNTER — Other Ambulatory Visit: Payer: Self-pay | Admitting: Internal Medicine

## 2022-05-20 ENCOUNTER — Encounter: Payer: Self-pay | Admitting: Family Medicine

## 2022-05-20 ENCOUNTER — Ambulatory Visit (INDEPENDENT_AMBULATORY_CARE_PROVIDER_SITE_OTHER): Payer: Medicare HMO | Admitting: Family Medicine

## 2022-05-20 VITALS — BP 99/64 | HR 54 | Resp 18 | Ht 67.0 in | Wt 162.0 lb

## 2022-05-20 DIAGNOSIS — M4727 Other spondylosis with radiculopathy, lumbosacral region: Secondary | ICD-10-CM | POA: Diagnosis not present

## 2022-05-20 DIAGNOSIS — M4726 Other spondylosis with radiculopathy, lumbar region: Secondary | ICD-10-CM | POA: Diagnosis not present

## 2022-05-20 DIAGNOSIS — E119 Type 2 diabetes mellitus without complications: Secondary | ICD-10-CM

## 2022-05-20 DIAGNOSIS — M47895 Other spondylosis, thoracolumbar region: Secondary | ICD-10-CM | POA: Diagnosis not present

## 2022-05-20 DIAGNOSIS — M9902 Segmental and somatic dysfunction of thoracic region: Secondary | ICD-10-CM | POA: Diagnosis not present

## 2022-05-20 DIAGNOSIS — E1169 Type 2 diabetes mellitus with other specified complication: Secondary | ICD-10-CM | POA: Diagnosis not present

## 2022-05-20 DIAGNOSIS — M9903 Segmental and somatic dysfunction of lumbar region: Secondary | ICD-10-CM | POA: Diagnosis not present

## 2022-05-20 DIAGNOSIS — I5032 Chronic diastolic (congestive) heart failure: Secondary | ICD-10-CM

## 2022-05-20 DIAGNOSIS — E785 Hyperlipidemia, unspecified: Secondary | ICD-10-CM | POA: Diagnosis not present

## 2022-05-20 DIAGNOSIS — M9904 Segmental and somatic dysfunction of sacral region: Secondary | ICD-10-CM | POA: Diagnosis not present

## 2022-05-20 DIAGNOSIS — I10 Essential (primary) hypertension: Secondary | ICD-10-CM | POA: Diagnosis not present

## 2022-05-20 LAB — POCT GLYCOSYLATED HEMOGLOBIN (HGB A1C): HbA1c POC (<> result, manual entry): 5.4 % (ref 4.0–5.6)

## 2022-05-20 NOTE — Assessment & Plan Note (Signed)
A1c today was 5.4, mother of several in a row that have been less than 6.0.  Patient would like to discontinue metformin and continue only Jardiance as previously discussed with Maritza.  We will discontinue metformin today but continue Jardiance and recheck A1c at his next visit in 3 months.

## 2022-05-20 NOTE — Assessment & Plan Note (Signed)
Currently following with cardiology.  He has been experiencing an increase shortness of breath and decreased exercise tolerance which is being worked up by cardiology

## 2022-05-20 NOTE — Progress Notes (Signed)
Established Patient Office Visit  Subjective   Patient ID: Jacob Harrison, male    DOB: 1941-01-05  Age: 82 y.o. MRN: [id number]  Chief Complaint  Patient presents with   Follow-up   Diabetes   Hyperlipidemia   Hypertension    HPI 82 year old male presents today for follow-up of diabetes mellitus, hyperlipidemia, hypertension.  Patient states he is doing well.  He has been experiencing some back pain after lifting a bread maker earlier this week.  He has been doing ice and heating pads and states that it is doing better.  He states that this is not something that he is concerned about but will let us know if it does get worse. Pt denies chest pain, dizziness, or heart palpitations.  Taking meds as directed w/o problems.  Denies Lasix or carvedilol side effects.    Over the past several months he states that he has been experiencing some shortness of breath and decreased exercise tolerance.  He is following up with cardiology for this issue.  He has an appointment in the next couple of weeks.  He wanted to see what his A1c was today as previously he had spoken with Herb Grays about potentially taking off the metformin and continuing with only the Jardiance as he has had several A1c's less than 6 previously.  Would like to discontinue the metformin if he has another A1c that is less than 6.  He does not need any refills on medication at this time and says that his pharmacy is good about making sure that he has enough refills available to him.  ROS See HPI   Objective:     BP 99/64 (BP Location: Left Arm, Patient Position: Sitting, Cuff Size: Normal)   Pulse (!) 54   Resp 18   Ht 5' 7"$  (1.702 m)   Wt 162 lb (73.5 kg)   SpO2 99%   BMI 25.37 kg/m   Physical Exam Constitutional:      General: He is not in acute distress.    Appearance: Normal appearance.  HENT:     Head: Normocephalic and atraumatic.  Cardiovascular:     Rate and Rhythm: Normal rate and regular rhythm.      Pulses: Normal pulses.     Heart sounds: Normal heart sounds.  Pulmonary:     Effort: Pulmonary effort is normal.     Breath sounds: Normal breath sounds.  Skin:    General: Skin is warm and dry.  Neurological:     Mental Status: He is alert and oriented to person, place, and time.  Psychiatric:        Mood and Affect: Mood normal.      Results for orders placed or performed in visit on 05/20/22  POCT HgB A1C  Result Value Ref Range   Hemoglobin A1C     HbA1c POC (<> result, manual entry) 5.4 4.0 - 5.6 %   HbA1c, POC (prediabetic range)     HbA1c, POC (controlled diabetic range)      The ASCVD Risk score (Arnett DK, et al., 2019) failed to calculate for the following reasons:   The patient has a prior MI or stroke diagnosis    Assessment & Plan:   Problem List Items Addressed This Visit       Cardiovascular and Mediastinum   Essential hypertension (Chronic)    Currently taking Lasix 40 mg daily and carvedilol 12.5 mg daily.  Blood pressure today was 99/64.  He is seeing cardiology for  worsening shortness of breath.  We may consider adjusting medications, but he does have a follow-up appointment with cardiology in the next couple of weeks.      (HFpEF) heart failure with preserved ejection fraction [facility])    Currently following with cardiology.  He has been experiencing an increase shortness of breath and decreased exercise tolerance which is being worked up by cardiology        Endocrine   Type 2 diabetes mellitus without complication, without long-term current use of insulin (HCC) - Primary (Chronic)    A1c today was 5.4, mother of several in a row that have been less than 6.0.  Patient would like to discontinue metformin and continue only Jardiance as previously discussed with Maritza.  We will discontinue metformin today but continue Jardiance and recheck A1c at his next visit in 3 months.      Relevant Orders   POCT HgB A1C (Completed)   Hyperlipidemia  associated with type 2 diabetes mellitus (HCC) (Chronic)    Stable.  Continue Zetia and Crestor.       Return in about 3 months (around 08/18/2022) for Follow up of HTN, diabetes, hyperlipidemia.  We will also get labs at that time.  Velva Harman, PA

## 2022-05-20 NOTE — Assessment & Plan Note (Signed)
Stable.  Continue Zetia and Crestor.

## 2022-05-20 NOTE — Assessment & Plan Note (Signed)
Currently taking Lasix 40 mg daily and carvedilol 12.5 mg daily.  Blood pressure today was 99/64.  He is seeing cardiology for worsening shortness of breath.  We may consider adjusting medications, but he does have a follow-up appointment with cardiology in the next couple of weeks.

## 2022-05-21 NOTE — H&P (View-Only) (Signed)
Follow-up Outpatient Visit Date: 05/22/2022  Primary Care Provider: Velva Harman, [place] [address]  Chief Complaint: Shortness of breath and chest tightness  HPI:  Jacob Harrison is a 82 y.o. 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  I saw him a month ago, at which time he reported increasing exertional dyspnea over the preceding 6 months, often accompanied by vague chest tightness.  He noted borderline low blood pressures at home without lightheadedness.  He also endorsed mild dependent edema in the left foot, which has been chronic.  We agreed to increase isosorbide mononitrate to 30 mg twice daily and obtain an echocardiogram.  Echo showed relatively stable findings with LVEF of 30-35%.  RV was mildly dilated with low normal systolic function.  There was mild mitral regurgitation and mild aortic stenosis.  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  Today, Jacob Harrison reports feeling a little bit better than at our last visit.  He still has shortness of breath with mild activity and chest tightness if he pushes himself through the shortness of breath, though he is not having to use nitroglycerin quite as often.  He also notices that his heart rate speeds up more than he would expect with modest activity.  This often accompanies his chest tightness and dyspnea.  Resting heart rate is typically 55-60 bpm.  He has not felt lightheaded or dizzy.  He notes some mild edema in the left foot at the Jacob Harrison of the day, which is typically is resolved by the next morning.  He has noted some low back pain and mild weakness in the right leg, which prompted him to seek out a chiropractor.  X-rays revealed  some scoliosis in the lumbar spine.  He is contemplating returning to see Jacob Harrison, who previously performed a lumbar spinal laminectomy.  --------------------------------------------------------------------------------------------------  Cardiovascular History & Procedures: Cardiovascular Problems: Coronary artery disease Ischemic cardiomyopathy Heart failure with preserved ejection fraction (HFpEF)   Risk Factors: Known coronary artery disease, hypertension, hyperlipidemia, diabetes mellitus, male gender, obesity, and age > 55   Cath/PCI: LHC (10/04/2020): LMCA with mild diffuse disease.  LAD with moderate diffuse proximal disease and chronic subtotal occlusion of the mid vessel.  Distal LAD fills via left to left and right to left collaterals.  LCx with moderate mid vessel disease.  RCA chronically occluded proximally, filling via left to right collaterals.  SVGs to RPDA, LAD, and OM are chronically occluded.  LIMA to LAD known to be atretic.  LVEDP 22 mmHg. LHC (10/18/12, [place], [place]): LMCA 20-30% ostial/proximal stenosis. LAD with sequential 100% and 95-99% mid stenoses. LCx with 40-50% mid stenosis. RCA with 100% proximal and 80-100% sequential distal lesions. RPDA and rPL1 have 99% and 95% stenoses, respectively. SVG->LAD and SVG->rPDA are patent. LVEF 67% with normal wall motion. LHC (04/05/10, [place], [place]): LMCA with 30% mid stenosis. LAD with 100% midvessel occlution. LCx with 40% mid stenosis. RCA with 100% proximal stenosis. LIMA->LAD atretic, SVG->LCx occluded, and SVG-> RCA with 80% ostial and 70% distal stenoses.   CV Surgery: Redo CABG (04/2010, [place], ): SVG->LAD and SVG->rPDA. CABG (2001, [place]): LIMA->LAD, SVG->LCx, and SVG->rPDA   EP Procedures and Devices:  [employer] single-chamber ICD (03/20/2021, Dr. Quentin Ore)   Non-Invasive Evaluation(s): TTE (04/21/2022): Normal LV size.  There is mild basal septal LVH with LVEF of 30-35%.   There is global hypokinesis.  Diastolic function is indeterminate.  RV is mildly enlarged with low normal systolic function.  Left atrium is mildly enlarged.  There is mild mitral regurgitation.  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  Ascending aorta is mildly enlarged, measuring 3.9 cm.  CVP normal. Limited TTE (02/20/2021): LVEF 30-35%.  Mild mitral regurgitation.  Aortic sclerosis without stenosis. TTE (11/27/2020): Normal LV size with mild LVH.  LVEF 30-35% with extensive wall motion abnormalities.  Grade 3 diastolic dysfunction with elevated filling pressure noted.  Normal RV size and function.  Mild left atrial enlargement.  Degenerative mitral valve with mild thickening and mild-moderate MR.  Mild aortic stenosis noted with valve area 2 cm and mean gradient 9 mmHg. TTE (09/23/2019): Normal LV size and wall thickness.  LVEF 60-65% with grade 1 diastolic dysfunction.  Normal RV size and function.  No atrial enlargement.  Aortic sclerosis without stenosis.  Normal CVP. Pharmacologic MPI (11/06/16): Low risk study without ischemia or scar. LVEF 48% (calculated) but visually appears normal. TTE (10/21/16): Normal LV size with mild LVH. LVEF 65-70% with normal diastolic function. Mild LA enlargement. Normal RV size and function. ABIs (03/08/15): Right 1.25, left 1.26; no significant change with exercise. Exercise myocardial perfusion stress test (10/07/12): Small to moderate in size, mild to moderate in severity, partially reversible defect involving the mid inferior and inferolateral segments. LVEF 65%.  Recent CV Pertinent Labs: Lab Results  Component Value Date   CHOL 139 02/17/2022   HDL 73 02/17/2022   LDLCALC 49 02/17/2022   TRIG 91 02/17/2022   CHOLHDL 1.9 02/17/2022   CHOLHDL 2.7 11/14/2016   BNP 599.9 (H) 10/25/2020   BNP 695.5 (H) 10/09/2020   K 4.1 02/17/2022   MG 1.9 02/25/2019   BUN 16 02/17/2022   CREATININE 1.03 02/17/2022    Past  medical and surgical history were reviewed and updated in EPIC.  Current Meds  Medication Sig   acetaminophen (TYLENOL) 500 MG tablet Take 1,000 mg by mouth every 6 (six) hours as needed for moderate pain or headache.   aspirin EC 81 MG tablet Take 1 tablet (81 mg total) by mouth daily.   carvedilol (COREG) 12.5 MG tablet TAKE 1 TABLET BY MOUTH TWICE A DAY WITH MEALS   Cholecalciferol (VITAMIN D-3 PO) Take 800 Units by mouth daily.   clopidogrel (PLAVIX) 75 MG tablet TAKE 1 TABLET (75 MG TOTAL) BY MOUTH DAILY. **NO PLAVIX UNTIL 12/20**   Coenzyme Q10 (CO Q-10) 200 MG CAPS Take 200 mg by mouth daily.    cyanocobalamin 500 MCG tablet Take 500 mcg by mouth daily.   diclofenac Sodium (VOLTAREN) 1 % GEL Apply 2 g topically 2 (two) times daily as needed.   empagliflozin (JARDIANCE) 10 MG TABS tablet TAKE 1 TABLET BY MOUTH DAILY BEFORE BREAKFAST.   ezetimibe (ZETIA) 10 MG tablet TAKE 1 TABLET BY MOUTH EVERY DAY   finasteride (PROSCAR) 5 MG tablet TAKE 1 TABLET BY MOUTH EVERY DAY   furosemide (LASIX) 40 MG tablet TAKE 1 TABLET BY MOUTH EVERY DAY   Glucosamine-Chondroit-Vit C-Mn (GLUCOSAMINE 1500 COMPLEX) CAPS Take 1 capsule by mouth daily.   isosorbide mononitrate (IMDUR) 30 MG 24 hr tablet Take 1 tablet (30 mg total) by mouth 2 (two) times daily.   Lancets [facility]  ULTRASOFT) lancets Use to check blood sugars fasting daily   losartan (COZAAR) 25 MG tablet Take 0.5 tablets (12.5 mg total) by mouth daily.   Multiple Vitamins-Minerals (PRESERVISION AREDS 2 PO) Take 1 capsule by mouth 2 (two) times a day.   nitroGLYCERIN (NITROSTAT) 0.4 MG SL tablet PLACE 1 TABLET UNDER THE TONGUE EVERY 5 MINUTES AS NEEDED.   ranolazine (RANEXA) 1000 MG SR tablet TAKE 1 TABLET BY MOUTH TWICE A DAY   rosuvastatin (CRESTOR) 20 MG tablet TAKE 1 TABLET BY MOUTH EVERYDAY AT BEDTIME    Allergies: Other and Tetracyclines & related  Social History   Tobacco Use   Smoking status: Never    Passive exposure: Never    Smokeless tobacco: Never  Vaping Use   Vaping Use: Never used  Substance Use Topics   Alcohol use: Yes    Alcohol/week: 11.0 standard drinks of alcohol    Types: 5 Glasses of wine, 6 Cans of beer per week    Comment: 1-2 drinks on occasion previously 5-7 drinks a week (was 10-15 drinks/week)   Drug use: No    Family History  Problem Relation Age of Onset   Diabetes Mother    Hyperlipidemia Mother    Cancer Father        colon   Skin cancer Brother    Alcohol abuse Maternal Uncle    Diabetes Maternal Uncle    Hyperlipidemia Maternal Uncle    Cancer Paternal Uncle        colon    Review of Systems: A 12-system review of systems was performed and was negative except as noted in the HPI.  --------------------------------------------------------------------------------------------------  Physical Exam: BP 110/62 (BP Location: Left Arm, Patient Position: Sitting, Cuff Size: Normal)   Pulse 65   Ht 5' 7"$  (1.702 m)   Wt 167 lb 4 oz (75.9 kg)   SpO2 98%   BMI 26.20 kg/m   General:  NAD. Neck: No JVD or HJR. Lungs: Dry crackles noted at the left lung base. Heart: Regular rate and rhythm with 1/6 systolic murmur. Abdomen: Soft, nontender, nondistended. Extremities: No lower extremity edema.  EKG: Normal sinus rhythm with first-degree AV block, inferior infarct, and poor R wave progression.  No significant change from prior tracing on 04/11/2022  Lab Results  Component Value Date   WBC 11.5 (H) 02/17/2022   HGB 13.8 02/17/2022   HCT 40.6 02/17/2022   MCV 96 02/17/2022   PLT 224 02/17/2022    Lab Results  Component Value Date   NA 138 02/17/2022   K 4.1 02/17/2022   CL 96 02/17/2022   CO2 27 02/17/2022   BUN 16 02/17/2022   CREATININE 1.03 02/17/2022   GLUCOSE 92 02/17/2022   ALT 23 02/17/2022    Lab Results  Component Value Date   CHOL 139 02/17/2022   HDL 73 02/17/2022   LDLCALC 49 02/17/2022   TRIG 91 02/17/2022   CHOLHDL 1.9 02/17/2022     --------------------------------------------------------------------------------------------------  ASSESSMENT AND PLAN: Coronary artery disease with stable angina: Overall, Jacob Harrison feels about the same or slightly better than at our last visit following escalation of isosorbide mononitrate to 30 mg twice daily.  He still has fairly pronounced symptoms with mild activity consistent with CCS class III angina.  An element of HFrEF is likely contributing as well.  We have discussed further evaluation and treatment options including continued medical therapy and cardiac catheterization.  His borderline low blood pressure precludes further escalation of his antianginal therapy  at this time.  We have agreed to move forward with right and left heart catheterization with possible PCI next week.  We will plan to continue his current medications for secondary prevention and antianginal therapy including aspirin, clopidogrel, carvedilol, isosorbide mononitrate, ranolazine, and rosuvastatin.  LDL well-controlled on last check in November.  Chronic HFrEF due to ischemic cardiomyopathy with dyspnea on exertion: Jacob Harrison appears euvolemic but still has NYHA class III symptoms.  He has some crackles at the left lung base today, which he notes has been present ever since his bypass surgeries.  Will obtain a PA and lateral chest radiograph today, given crackles appreciated at the left lung base on exam, as well as a CBC, BMP, and BNP.  Will proceed with right and left heart catheterization, as detailed above.  Continue furosemide 40 mg daily for continued diuresis as well as GDMT with carvedilol, empagliflozin, and losartan.  I am reluctant to escalate losartan or transition to [facility] today given his borderline low blood pressure at times.  Hyperlipidemia associated with type 2 diabetes mellitus: Lipids well-controlled on last check in November.  Continue rosuvastatin 20 mg daily.  Shared Decision  Making/Informed Consent The risks [stroke (1 in 1000), death (1 in 1000), kidney failure [usually temporary] (1 in 500), bleeding (1 in 200), allergic reaction [possibly serious] (1 in 200)], benefits (diagnostic support and management of coronary artery disease) and alternatives of a cardiac catheterization were discussed in detail with Jacob Harrison and he is willing to proceed.  Follow-up: Return to clinic 2 weeks after catheterization  Nelva Bush, MD 05/22/2022 9:17 AM

## 2022-05-21 NOTE — Progress Notes (Unsigned)
Follow-up Outpatient Visit Date: 05/22/2022  Primary Care Provider: Velva Harman, [place] [address]  Chief Complaint: Shortness of breath and chest tightness  HPI:  Jacob Harrison is a 82 y.o. 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  I saw him a month ago, at which time he reported increasing exertional dyspnea over the preceding 6 months, often accompanied by vague chest tightness.  He noted borderline low blood pressures at home without lightheadedness.  He also endorsed mild dependent edema in the left foot, which has been chronic.  We agreed to increase isosorbide mononitrate to 30 mg twice daily and obtain an echocardiogram.  Echo showed relatively stable findings with LVEF of 30-35%.  RV was mildly dilated with low normal systolic function.  There was mild mitral regurgitation and mild aortic stenosis.  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  Today, Jacob Harrison reports feeling a little bit better than at our last visit.  He still has shortness of breath with mild activity and chest tightness if he pushes himself through the shortness of breath, though he is not having to use nitroglycerin quite as often.  He also notices that his heart rate speeds up more than he would expect with modest activity.  This often accompanies his chest tightness and dyspnea.  Resting heart rate is typically 55-60 bpm.  He has not felt lightheaded or dizzy.  He notes some mild edema in the left foot at the [place] of the day, which is typically is resolved by the next morning.  He has noted some low back pain and mild weakness in the right leg, which prompted him to seek out a chiropractor.  X-rays revealed  some scoliosis in the lumbar spine.  He is contemplating returning to see Dr. Lorin Mercy, who previously performed a lumbar spinal laminectomy.  --------------------------------------------------------------------------------------------------  Cardiovascular History & Procedures: Cardiovascular Problems: Coronary artery disease Ischemic cardiomyopathy Heart failure with preserved ejection fraction (HFpEF)   Risk Factors: Known coronary artery disease, hypertension, hyperlipidemia, diabetes mellitus, male gender, obesity, and age > 11   Cath/PCI: LHC (10/04/2020): LMCA with mild diffuse disease.  LAD with moderate diffuse proximal disease and chronic subtotal occlusion of the mid vessel.  Distal LAD fills via left to left and right to left collaterals.  LCx with moderate mid vessel disease.  RCA chronically occluded proximally, filling via left to right collaterals.  SVGs to RPDA, LAD, and OM are chronically occluded.  LIMA to LAD known to be atretic.  LVEDP 22 mmHg. LHC (10/18/12, [place], [place]): LMCA 20-30% ostial/proximal stenosis. LAD with sequential 100% and 95-99% mid stenoses. LCx with 40-50% mid stenosis. RCA with 100% proximal and 80-100% sequential distal lesions. RPDA and rPL1 have 99% and 95% stenoses, respectively. SVG->LAD and SVG->rPDA are patent. LVEF 67% with normal wall motion. LHC (04/05/10, [place], [place]): LMCA with 30% mid stenosis. LAD with 100% midvessel occlution. LCx with 40% mid stenosis. RCA with 100% proximal stenosis. LIMA->LAD atretic, SVG->LCx occluded, and SVG-> RCA with 80% ostial and 70% distal stenoses.   CV Surgery: Redo CABG (04/2010, [place], [place]): SVG->LAD and SVG->rPDA. CABG (2001, [place]): LIMA->LAD, SVG->LCx, and SVG->rPDA   EP Procedures and Devices:  Medtronic single-chamber ICD (03/20/2021, Dr. Quentin Ore)   Non-Invasive Evaluation(s): TTE (04/21/2022): Normal LV size.  There is mild basal septal LVH with LVEF of 30-35%.   There is global hypokinesis.  Diastolic function is indeterminate.  RV is mildly enlarged with low normal systolic function.  Left atrium is mildly enlarged.  There is mild mitral regurgitation.  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  Ascending aorta is mildly enlarged, measuring 3.9 cm.  CVP normal. Limited TTE (02/20/2021): LVEF 30-35%.  Mild mitral regurgitation.  Aortic sclerosis without stenosis. TTE (11/27/2020): Normal LV size with mild LVH.  LVEF 30-35% with extensive wall motion abnormalities.  Grade 3 diastolic dysfunction with elevated filling pressure noted.  Normal RV size and function.  Mild left atrial enlargement.  Degenerative mitral valve with mild thickening and mild-moderate MR.  Mild aortic stenosis noted with valve area 2 cm and mean gradient 9 mmHg. TTE (09/23/2019): Normal LV size and wall thickness.  LVEF 60-65% with grade 1 diastolic dysfunction.  Normal RV size and function.  No atrial enlargement.  Aortic sclerosis without stenosis.  Normal CVP. Pharmacologic MPI (11/06/16): Low risk study without ischemia or scar. LVEF 48% (calculated) but visually appears normal. TTE (10/21/16): Normal LV size with mild LVH. LVEF 65-70% with normal diastolic function. Mild LA enlargement. Normal RV size and function. ABIs (03/08/15): Right 1.25, left 1.26; no significant change with exercise. Exercise myocardial perfusion stress test (10/07/12): Small to moderate in size, mild to moderate in severity, partially reversible defect involving the mid inferior and inferolateral segments. LVEF 65%.  Recent CV Pertinent Labs: Lab Results  Component Value Date   CHOL 139 02/17/2022   HDL 73 02/17/2022   LDLCALC 49 02/17/2022   TRIG 91 02/17/2022   CHOLHDL 1.9 02/17/2022   CHOLHDL 2.7 11/14/2016   BNP 599.9 (H) 10/25/2020   BNP 695.5 (H) 10/09/2020   K 4.1 02/17/2022   MG 1.9 02/25/2019   BUN 16 02/17/2022   CREATININE 1.03 02/17/2022    Past  medical and surgical history were reviewed and updated in EPIC.  Current Meds  Medication Sig   acetaminophen (TYLENOL) 500 MG tablet Take 1,000 mg by mouth every 6 (six) hours as needed for moderate pain or headache.   aspirin EC 81 MG tablet Take 1 tablet (81 mg total) by mouth daily.   carvedilol (COREG) 12.5 MG tablet TAKE 1 TABLET BY MOUTH TWICE A DAY WITH MEALS   Cholecalciferol (VITAMIN D-3 PO) Take 800 Units by mouth daily.   clopidogrel (PLAVIX) 75 MG tablet TAKE 1 TABLET (75 MG TOTAL) BY MOUTH DAILY. **NO PLAVIX UNTIL 12/20**   Coenzyme Q10 (CO Q-10) 200 MG CAPS Take 200 mg by mouth daily.    cyanocobalamin 500 MCG tablet Take 500 mcg by mouth daily.   diclofenac Sodium (VOLTAREN) 1 % GEL Apply 2 g topically 2 (two) times daily as needed.   empagliflozin (JARDIANCE) 10 MG TABS tablet TAKE 1 TABLET BY MOUTH DAILY BEFORE BREAKFAST.   ezetimibe (ZETIA) 10 MG tablet TAKE 1 TABLET BY MOUTH EVERY DAY   finasteride (PROSCAR) 5 MG tablet TAKE 1 TABLET BY MOUTH EVERY DAY   furosemide (LASIX) 40 MG tablet TAKE 1 TABLET BY MOUTH EVERY DAY   Glucosamine-Chondroit-Vit C-Mn (GLUCOSAMINE 1500 COMPLEX) CAPS Take 1 capsule by mouth daily.   isosorbide mononitrate (IMDUR) 30 MG 24 hr tablet Take 1 tablet (30 mg total) by mouth 2 (two) times daily.   Lancets [facility]  ULTRASOFT) lancets Use to check blood sugars fasting daily   losartan (COZAAR) 25 MG tablet Take 0.5 tablets (12.5 mg total) by mouth daily.   Multiple Vitamins-Minerals (PRESERVISION AREDS 2 PO) Take 1 capsule by mouth 2 (two) times a day.   nitroGLYCERIN (NITROSTAT) 0.4 MG SL tablet PLACE 1 TABLET UNDER THE TONGUE EVERY 5 MINUTES AS NEEDED.   ranolazine (RANEXA) 1000 MG SR tablet TAKE 1 TABLET BY MOUTH TWICE A DAY   rosuvastatin (CRESTOR) 20 MG tablet TAKE 1 TABLET BY MOUTH EVERYDAY AT BEDTIME    Allergies: Other and Tetracyclines & related  Social History   Tobacco Use   Smoking status: Never    Passive exposure: Never    Smokeless tobacco: Never  Vaping Use   Vaping Use: Never used  Substance Use Topics   Alcohol use: Yes    Alcohol/week: 11.0 standard drinks of alcohol    Types: 5 Glasses of wine, 6 Cans of beer per week    Comment: 1-2 drinks on occasion previously 5-7 drinks a week (was 10-15 drinks/week)   Drug use: No    Family History  Problem Relation Age of Onset   Diabetes Mother    Hyperlipidemia Mother    Cancer Father        colon   Skin cancer Brother    Alcohol abuse Maternal Uncle    Diabetes Maternal Uncle    Hyperlipidemia Maternal Uncle    Cancer Paternal Uncle        colon    Review of Systems: A 12-system review of systems was performed and was negative except as noted in the HPI.  --------------------------------------------------------------------------------------------------  Physical Exam: BP 110/62 (BP Location: Left Arm, Patient Position: Sitting, Cuff Size: Normal)   Pulse 65   Ht 5' 7"$  (1.702 m)   Wt 167 lb 4 oz (75.9 kg)   SpO2 98%   BMI 26.20 kg/m   General:  NAD. Neck: No JVD or HJR. Lungs: Dry crackles noted at the left lung base. Heart: Regular rate and rhythm with 1/6 systolic murmur. Abdomen: Soft, nontender, nondistended. Extremities: No lower extremity edema.  EKG: Normal sinus rhythm with first-degree AV block, inferior infarct, and poor R wave progression.  No significant change from prior tracing on 04/11/2022  Lab Results  Component Value Date   WBC 11.5 (H) 02/17/2022   HGB 13.8 02/17/2022   HCT 40.6 02/17/2022   MCV 96 02/17/2022   PLT 224 02/17/2022    Lab Results  Component Value Date   NA 138 02/17/2022   K 4.1 02/17/2022   CL 96 02/17/2022   CO2 27 02/17/2022   BUN 16 02/17/2022   CREATININE 1.03 02/17/2022   GLUCOSE 92 02/17/2022   ALT 23 02/17/2022    Lab Results  Component Value Date   CHOL 139 02/17/2022   HDL 73 02/17/2022   LDLCALC 49 02/17/2022   TRIG 91 02/17/2022   CHOLHDL 1.9 02/17/2022     --------------------------------------------------------------------------------------------------  ASSESSMENT AND PLAN: Coronary artery disease with stable angina: Overall, Jacob Harrison feels about the same or slightly better than at our last visit following escalation of isosorbide mononitrate to 30 mg twice daily.  He still has fairly pronounced symptoms with mild activity consistent with CCS class III angina.  An element of HFrEF is likely contributing as well.  We have discussed further evaluation and treatment options including continued medical therapy and cardiac catheterization.  His borderline low blood pressure precludes further escalation of his antianginal therapy  at this time.  We have agreed to move forward with right and left heart catheterization with possible PCI next week.  We will plan to continue his current medications for secondary prevention and antianginal therapy including aspirin, clopidogrel, carvedilol, isosorbide mononitrate, ranolazine, and rosuvastatin.  LDL well-controlled on last check in November.  Chronic HFrEF due to ischemic cardiomyopathy with dyspnea on exertion: Jacob Harrison appears euvolemic but still has NYHA class III symptoms.  He has some crackles at the left lung base today, which he notes has been present ever since his bypass surgeries.  Will obtain a PA and lateral chest radiograph today, given crackles appreciated at the left lung base on exam, as well as a CBC, BMP, and BNP.  Will proceed with right and left heart catheterization, as detailed above.  Continue furosemide 40 mg daily for continued diuresis as well as GDMT with carvedilol, empagliflozin, and losartan.  I am reluctant to escalate losartan or transition to [facility] today given his borderline low blood pressure at times.  Hyperlipidemia associated with type 2 diabetes mellitus: Lipids well-controlled on last check in November.  Continue rosuvastatin 20 mg daily.  Shared Decision  Making/Informed Consent The risks [stroke (1 in 1000), death (1 in 1000), kidney failure [usually temporary] (1 in 500), bleeding (1 in 200), allergic reaction [possibly serious] (1 in 200)], benefits (diagnostic support and management of coronary artery disease) and alternatives of a cardiac catheterization were discussed in detail with Jacob Harrison and he is willing to proceed.  Follow-up: Return to clinic 2 weeks after catheterization  Nelva Bush, MD 05/22/2022 9:17 AM

## 2022-05-22 ENCOUNTER — Encounter: Payer: Self-pay | Admitting: Internal Medicine

## 2022-05-22 ENCOUNTER — Ambulatory Visit
Admission: RE | Admit: 2022-05-22 | Discharge: 2022-05-22 | Disposition: A | Discharge status: Home / Self Care | Payer: Medicare HMO | Source: Ambulatory Visit | Attending: Internal Medicine | Admitting: Internal Medicine

## 2022-05-22 ENCOUNTER — Ambulatory Visit
Admission: RE | Admit: 2022-05-22 | Discharge: 2022-05-22 | Disposition: A | Discharge status: Home / Self Care | Payer: Medicare HMO | Attending: Internal Medicine | Admitting: Internal Medicine

## 2022-05-22 ENCOUNTER — Ambulatory Visit: Payer: Medicare HMO | Attending: Internal Medicine | Admitting: Internal Medicine

## 2022-05-22 ENCOUNTER — Other Ambulatory Visit
Admission: RE | Admit: 2022-05-22 | Discharge: 2022-05-22 | Disposition: A | Discharge status: Home / Self Care | Payer: Medicare HMO | Source: Home / Self Care | Attending: Internal Medicine | Admitting: Internal Medicine

## 2022-05-22 VITALS — BP 110/62 | HR 65 | Ht 67.0 in | Wt 167.2 lb

## 2022-05-22 DIAGNOSIS — R0609 Other forms of dyspnea: Secondary | ICD-10-CM | POA: Insufficient documentation

## 2022-05-22 DIAGNOSIS — I255 Ischemic cardiomyopathy: Secondary | ICD-10-CM

## 2022-05-22 DIAGNOSIS — E1169 Type 2 diabetes mellitus with other specified complication: Secondary | ICD-10-CM

## 2022-05-22 DIAGNOSIS — I5022 Chronic systolic (congestive) heart failure: Secondary | ICD-10-CM

## 2022-05-22 DIAGNOSIS — I25118 Atherosclerotic heart disease of native coronary artery with other forms of angina pectoris: Secondary | ICD-10-CM

## 2022-05-22 DIAGNOSIS — E785 Hyperlipidemia, unspecified: Secondary | ICD-10-CM

## 2022-05-22 DIAGNOSIS — R0602 Shortness of breath: Secondary | ICD-10-CM | POA: Diagnosis not present

## 2022-05-22 DIAGNOSIS — R918 Other nonspecific abnormal finding of lung field: Secondary | ICD-10-CM | POA: Insufficient documentation

## 2022-05-22 LAB — BASIC METABOLIC PANEL
Anion gap: 11 (ref 5–15)
BUN: 20 mg/dL (ref 8–23)
CO2: 26 mmol/L (ref 22–32)
Calcium: 9.3 mg/dL (ref 8.9–10.3)
Chloride: 97 mmol/L — ABNORMAL LOW (ref 98–111)
Creatinine, Ser: 0.96 mg/dL (ref 0.61–1.24)
GFR, Estimated: >60 mL/min (ref >60)
Glucose, Bld: 103 mg/dL — ABNORMAL HIGH (ref 70–99)
Potassium: 4.3 mmol/L (ref 3.5–5.1)
Sodium: 134 mmol/L — ABNORMAL LOW (ref 135–145)

## 2022-05-22 LAB — BRAIN NATRIURETIC PEPTIDE: B Natriuretic Peptide: 107.1 pg/mL — ABNORMAL HIGH (ref 0.0–100.0)

## 2022-05-22 LAB — CBC
HCT: 41.6 % (ref 39.0–52.0)
Hemoglobin: 14.5 g/dL (ref 13.0–17.0)
MCH: 32.2 pg (ref 26.0–34.0)
MCHC: 34.9 g/dL (ref 30.0–36.0)
MCV: 92.2 fL (ref 80.0–100.0)
Platelets: 200 10*3/uL (ref 150–400)
RBC: 4.51 MIL/uL (ref 4.22–5.81)
RDW: 14.9 % (ref 11.5–15.5)
WBC: 14 10*3/uL — ABNORMAL HIGH (ref 4.0–10.5)
nRBC: 0 % (ref 0.0–0.2)

## 2022-05-22 NOTE — Patient Instructions (Addendum)
Medication Instructions:  Your Physician recommend you continue on your current medication as directed.    *If you need a refill on your cardiac medications before your next appointment, please call your pharmacy*   Lab Work: Your provider would like for you to have following labs drawn: (CBC, BMP, BNP).   Please go to the [facility] entrance and check in at the front desk.  You do not need an appointment.  They are open from 7am-6 pm.    If you have labs (blood work) drawn today and your tests are completely normal, you will receive your results only by: [place] (if you have MyChart) OR A paper copy in the mail If you have any lab test that is abnormal or we need to change your treatment, we will call you to review the results.   Testing/Procedures: A chest x-ray takes a picture of the organs and structures inside the chest, including the heart, lungs, and blood vessels. This test can show several things, including, whether the heart is enlarges; whether fluid is building up in the lungs; and whether pacemaker / defibrillator leads are still in place. [place]  Your physician has requested that you have a cardiac catheterization. Cardiac catheterization is used to diagnose and/or treat various heart conditions. Doctors may recommend this procedure for a number of different reasons. The most common reason is to evaluate chest pain. Chest pain can be a symptom of coronary artery disease (CAD), and cardiac catheterization can show whether plaque is narrowing or blocking your heart's arteries. This procedure is also used to evaluate the valves, as well as measure the blood flow and oxygen levels in different parts of your heart. For further information please visit [email or web address]. Please follow instruction sheet, as given. Please see instructions below    Follow-Up: At [facility], you and your health needs are our priority.  As part of our continuing  mission to provide you with exceptional heart care, we have created designated Provider Care Teams.  These Care Teams include your primary Cardiologist (physician) and Advanced Practice Providers (APPs -  Physician Assistants and Nurse Practitioners) who all work together to provide you with the care you need, when you need it.  We recommend signing up for the patient portal called "MyChart".  Sign up information is provided on this After Visit Summary.  MyChart is used to connect with patients for Virtual Visits (Telemedicine).  Patients are able to view lab/test results, encounter notes, upcoming appointments, etc.  Non-urgent messages can be sent to your provider as well.   To learn more about what you can do with MyChart, go to [email or web address].    Your next appointment:   2 week(s) after Cath on 05/27/22  Provider:   You may see Nelva Bush, MD or one of the following Advanced Practice Providers on your designated Care Team:   Murray Hodgkins, NP Christell Faith, PA-C Cadence Kathlen Mody, PA-C Gerrie Nordmann, NP      [facility] [address] [place] [address] Dept: [phone] Loc: [phone]   Cardiac/Peripheral Catheterization   You are scheduled for a Cardiac Catheterization on Tuesday, February 20 with Dr. Harrell Gave End.  1. Arrive at the [facility] entrance at 9:30 am, one hour prior to your procedure. Free valet service is available.  After entering the [facility] please check-in at the registration desk (1st desk on your right) to receive your armband. After receiving your armband someone will escort you  to the cardiac cath/special procedures waiting area. The support person will be asked to wait in the waiting room.  It is OK to have someone drop you off and come back when you are ready to be discharged.        Special note: Every effort is made to have your procedure done on time. Please understand that emergencies sometimes delay  scheduled procedures.   . 2. Diet: Do not eat solid foods after midnight.  You may have clear liquids until 5 AM the day of the procedure.  3. Labs: You will need to have blood drawn today (CBC, BMP, BNP)  4. Medication instructions in preparation for your procedure:   Contrast Allergy: No   Hold Jardiance and Lasix the morning of procedure   On the morning of your procedure, take Aspirin 81 mg and Plavix/Clopidogrel and any morning medicines NOT listed above.  You may use sips of water.  5. Plan to go home the same day, you will only stay overnight if medically necessary. 6. You MUST have a responsible adult to drive you home. 7. An adult MUST be with you the first 24 hours after you arrive home. 8. Bring a current list of your medications, and the last time and date medication taken. 9. Bring ID and current insurance cards. 10.Please wear clothes that are easy to get on and off and wear slip-on shoes.  Thank you for allowing [place] to care for you!   -- [facility]

## 2022-05-23 ENCOUNTER — Encounter: Payer: Self-pay | Admitting: Family Medicine

## 2022-05-23 ENCOUNTER — Other Ambulatory Visit: Payer: Self-pay | Admitting: Family Medicine

## 2022-05-23 DIAGNOSIS — D72829 Elevated white blood cell count, unspecified: Secondary | ICD-10-CM

## 2022-05-27 ENCOUNTER — Telehealth: Payer: Self-pay | Admitting: Internal Medicine

## 2022-05-27 ENCOUNTER — Other Ambulatory Visit: Payer: Self-pay

## 2022-05-27 ENCOUNTER — Encounter: Payer: Self-pay | Admitting: Internal Medicine

## 2022-05-27 ENCOUNTER — Encounter
Admission: RE | Disposition: A | Discharge status: Home / Self Care | Payer: Self-pay | Source: Home / Self Care | Attending: Internal Medicine

## 2022-05-27 ENCOUNTER — Ambulatory Visit
Admission: RE | Admit: 2022-05-27 | Discharge: 2022-05-27 | Disposition: A | Discharge status: Home / Self Care | Payer: Medicare HMO | Attending: Internal Medicine | Admitting: Internal Medicine

## 2022-05-27 DIAGNOSIS — I080 Rheumatic disorders of both mitral and aortic valves: Secondary | ICD-10-CM | POA: Diagnosis not present

## 2022-05-27 DIAGNOSIS — I110 Hypertensive heart disease with heart failure: Secondary | ICD-10-CM | POA: Diagnosis not present

## 2022-05-27 DIAGNOSIS — Z955 Presence of coronary angioplasty implant and graft: Secondary | ICD-10-CM | POA: Insufficient documentation

## 2022-05-27 DIAGNOSIS — G8929 Other chronic pain: Secondary | ICD-10-CM | POA: Diagnosis not present

## 2022-05-27 DIAGNOSIS — I25718 Atherosclerosis of autologous vein coronary artery bypass graft(s) with other forms of angina pectoris: Secondary | ICD-10-CM | POA: Diagnosis not present

## 2022-05-27 DIAGNOSIS — Z79899 Other long term (current) drug therapy: Secondary | ICD-10-CM | POA: Insufficient documentation

## 2022-05-27 DIAGNOSIS — Z95810 Presence of automatic (implantable) cardiac defibrillator: Secondary | ICD-10-CM | POA: Insufficient documentation

## 2022-05-27 DIAGNOSIS — I255 Ischemic cardiomyopathy: Secondary | ICD-10-CM | POA: Insufficient documentation

## 2022-05-27 DIAGNOSIS — R0609 Other forms of dyspnea: Secondary | ICD-10-CM | POA: Insufficient documentation

## 2022-05-27 DIAGNOSIS — Z7984 Long term (current) use of oral hypoglycemic drugs: Secondary | ICD-10-CM | POA: Insufficient documentation

## 2022-05-27 DIAGNOSIS — Z7902 Long term (current) use of antithrombotics/antiplatelets: Secondary | ICD-10-CM | POA: Insufficient documentation

## 2022-05-27 DIAGNOSIS — I2582 Chronic total occlusion of coronary artery: Secondary | ICD-10-CM | POA: Insufficient documentation

## 2022-05-27 DIAGNOSIS — I25118 Atherosclerotic heart disease of native coronary artery with other forms of angina pectoris: Secondary | ICD-10-CM | POA: Insufficient documentation

## 2022-05-27 DIAGNOSIS — I5022 Chronic systolic (congestive) heart failure: Secondary | ICD-10-CM | POA: Diagnosis not present

## 2022-05-27 DIAGNOSIS — E119 Type 2 diabetes mellitus without complications: Secondary | ICD-10-CM | POA: Insufficient documentation

## 2022-05-27 DIAGNOSIS — Z7982 Long term (current) use of aspirin: Secondary | ICD-10-CM | POA: Diagnosis not present

## 2022-05-27 DIAGNOSIS — I2089 Other forms of angina pectoris: Secondary | ICD-10-CM

## 2022-05-27 DIAGNOSIS — Z951 Presence of aortocoronary bypass graft: Secondary | ICD-10-CM | POA: Diagnosis not present

## 2022-05-27 DIAGNOSIS — E785 Hyperlipidemia, unspecified: Secondary | ICD-10-CM | POA: Diagnosis not present

## 2022-05-27 HISTORY — PX: RIGHT/LEFT HEART CATH AND CORONARY/GRAFT ANGIOGRAPHY: CATH118267

## 2022-05-27 LAB — POCT I-STAT 7, (LYTES, BLD GAS, ICA,H+H)
Acid-base deficit: 1 mmol/L (ref 0.0–2.0)
Bicarbonate: 24.3 mmol/L (ref 20.0–28.0)
Calcium, Ion: 1.21 mmol/L (ref 1.15–1.40)
HCT: 39 % (ref 39.0–52.0)
Hemoglobin: 13.3 g/dL (ref 13.0–17.0)
O2 Saturation: 95 %
Potassium: 3.7 mmol/L (ref 3.5–5.1)
Sodium: 136 mmol/L (ref 135–145)
TCO2: 26 mmol/L (ref 22–32)
pCO2 arterial: 40.4 mmHg (ref 32–48)
pH, Arterial: 7.388 (ref 7.35–7.45)
pO2, Arterial: 79 mmHg — ABNORMAL LOW (ref 83–108)

## 2022-05-27 LAB — POCT I-STAT EG7
Acid-Base Excess: 0 mmol/L (ref 0.0–2.0)
Bicarbonate: 25.4 mmol/L (ref 20.0–28.0)
Calcium, Ion: 1.23 mmol/L (ref 1.15–1.40)
HCT: 40 % (ref 39.0–52.0)
Hemoglobin: 13.6 g/dL (ref 13.0–17.0)
O2 Saturation: 70 %
Potassium: 3.8 mmol/L (ref 3.5–5.1)
Sodium: 136 mmol/L (ref 135–145)
TCO2: 27 mmol/L (ref 22–32)
pCO2, Ven: 44.3 mmHg (ref 44–60)
pH, Ven: 7.367 (ref 7.25–7.43)
pO2, Ven: 38 mmHg (ref 32–45)

## 2022-05-27 LAB — CBC WITH DIFFERENTIAL/PLATELET
Abs Immature Granulocytes: 0.08 10*3/uL — ABNORMAL HIGH (ref 0.00–0.07)
Basophils Absolute: 0.1 10*3/uL (ref 0.0–0.1)
Basophils Relative: 0 %
Eosinophils Absolute: 0.2 10*3/uL (ref 0.0–0.5)
Eosinophils Relative: 1 %
HCT: 39.5 % (ref 39.0–52.0)
Hemoglobin: 13.7 g/dL (ref 13.0–17.0)
Immature Granulocytes: 1 %
Lymphocytes Relative: 20 %
Lymphs Abs: 2.9 10*3/uL (ref 0.7–4.0)
MCH: 32.4 pg (ref 26.0–34.0)
MCHC: 34.7 g/dL (ref 30.0–36.0)
MCV: 93.4 fL (ref 80.0–100.0)
Monocytes Absolute: 1.2 10*3/uL — ABNORMAL HIGH (ref 0.1–1.0)
Monocytes Relative: 9 %
Neutro Abs: 9.8 10*3/uL — ABNORMAL HIGH (ref 1.7–7.7)
Neutrophils Relative %: 69 %
Platelets: 181 10*3/uL (ref 150–400)
RBC: 4.23 MIL/uL (ref 4.22–5.81)
RDW: 15 % (ref 11.5–15.5)
WBC: 14.2 10*3/uL — ABNORMAL HIGH (ref 4.0–10.5)
nRBC: 0 % (ref 0.0–0.2)

## 2022-05-27 LAB — GLUCOSE, CAPILLARY: Glucose-Capillary: 103 mg/dL — ABNORMAL HIGH (ref 70–99)

## 2022-05-27 SURGERY — RIGHT/LEFT HEART CATH AND CORONARY/GRAFT ANGIOGRAPHY
Anesthesia: Moderate Sedation

## 2022-05-27 MED ORDER — SODIUM CHLORIDE 0.9% FLUSH: 3.0000 mL | Freq: Two times a day (BID) | INTRAVENOUS | Status: DC

## 2022-05-27 MED ORDER — MIDAZOLAM HCL 2 MG/2ML IJ SOLN
INTRAMUSCULAR | Status: AC
  Filled 2022-05-27: qty 2

## 2022-05-27 MED ORDER — HEPARIN (PORCINE) IN NACL 1000-0.9 UT/500ML-% IV SOLN
INTRAVENOUS | Status: AC
  Filled 2022-05-27: qty 1000

## 2022-05-27 MED ORDER — HEPARIN SODIUM (PORCINE) 1000 UNIT/ML IJ SOLN
INTRAMUSCULAR | Status: AC
  Filled 2022-05-27: qty 10

## 2022-05-27 MED ORDER — AZITHROMYCIN 250 MG PO TABS: ORAL_TABLET | ORAL | 0 refills | Status: DC

## 2022-05-27 MED ORDER — ACETAMINOPHEN 325 MG PO TABS: 650.0000 mg | ORAL_TABLET | ORAL | Status: DC | PRN

## 2022-05-27 MED ORDER — ONDANSETRON HCL 4 MG/2ML IJ SOLN: 4.0000 mg | Freq: Four times a day (QID) | INTRAMUSCULAR | Status: DC | PRN

## 2022-05-27 MED ORDER — HEPARIN (PORCINE) IN NACL 2000-0.9 UNIT/L-% IV SOLN
INTRAVENOUS | Status: DC | PRN
  Administered 2022-05-27: 1000 mL

## 2022-05-27 MED ORDER — SODIUM CHLORIDE 0.9 % IV SOLN: 250.0000 mL | INTRAVENOUS | Status: DC | PRN

## 2022-05-27 MED ORDER — HEPARIN SODIUM (PORCINE) 1000 UNIT/ML IJ SOLN
INTRAMUSCULAR | Status: DC | PRN
  Administered 2022-05-27: 3500 [IU] via INTRAVENOUS

## 2022-05-27 MED ORDER — SODIUM CHLORIDE 0.9% FLUSH: 3.0000 mL | INTRAVENOUS | Status: DC | PRN

## 2022-05-27 MED ORDER — HYDRALAZINE HCL 20 MG/ML IJ SOLN: 10.0000 mg | INTRAMUSCULAR | Status: DC | PRN

## 2022-05-27 MED ORDER — IOHEXOL 300 MG/ML  SOLN
INTRAMUSCULAR | Status: DC | PRN
  Administered 2022-05-27: 25 mL

## 2022-05-27 MED ORDER — MIDAZOLAM HCL 2 MG/2ML IJ SOLN
INTRAMUSCULAR | Status: DC | PRN
  Administered 2022-05-27: .5 mg via INTRAVENOUS

## 2022-05-27 MED ORDER — ASPIRIN 81 MG PO CHEW: 81.0000 mg | CHEWABLE_TABLET | ORAL | Status: DC

## 2022-05-27 MED ORDER — LABETALOL HCL 5 MG/ML IV SOLN: 10.0000 mg | INTRAVENOUS | Status: DC | PRN

## 2022-05-27 MED ORDER — FENTANYL CITRATE (PF) 100 MCG/2ML IJ SOLN
INTRAMUSCULAR | Status: AC
  Filled 2022-05-27: qty 2

## 2022-05-27 MED ORDER — CARVEDILOL 12.5 MG PO TABS: 6.2500 mg | ORAL_TABLET | Freq: Two times a day (BID) | ORAL | 3 refills | Status: DC

## 2022-05-27 MED ORDER — SODIUM CHLORIDE 0.9 % IV SOLN: INTRAVENOUS | Status: DC

## 2022-05-27 MED ORDER — VERAPAMIL HCL 2.5 MG/ML IV SOLN
INTRAVENOUS | Status: DC | PRN
  Administered 2022-05-27 (×2): 2.5 mg via INTRA_ARTERIAL

## 2022-05-27 MED ORDER — VERAPAMIL HCL 2.5 MG/ML IV SOLN
INTRAVENOUS | Status: AC
  Filled 2022-05-27: qty 2

## 2022-05-27 MED ORDER — FENTANYL CITRATE (PF) 100 MCG/2ML IJ SOLN
INTRAMUSCULAR | Status: DC | PRN
  Administered 2022-05-27: 12.5 ug via INTRAVENOUS

## 2022-05-27 SURGICAL SUPPLY — 14 items
BAND ZEPHYR COMPRESS 30 LONG (HEMOSTASIS) IMPLANT
CATH BALLN WEDGE 5F 110CM (CATHETERS) IMPLANT
CATH INFINITI 5 FR JL3.5 (CATHETERS) IMPLANT
CATH INFINITI 5FR AL1 (CATHETERS) IMPLANT
CATH INFINITI 5FR MULTPACK ANG (CATHETERS) IMPLANT
DRAPE BRACHIAL (DRAPES) IMPLANT
GLIDESHEATH SLEND SS 6F .021 (SHEATH) IMPLANT
GUIDEWIRE INQWIRE 1.5J.035X260 (WIRE) IMPLANT
INQWIRE 1.5J .035X260CM (WIRE) ×1
PACK CARDIAC CATH (CUSTOM PROCEDURE TRAY) ×1 IMPLANT
PROTECTION STATION PRESSURIZED (MISCELLANEOUS) ×1
SET ATX SIMPLICITY (MISCELLANEOUS) IMPLANT
SHEATH GLIDE SLENDER 4/5FR (SHEATH) IMPLANT
STATION PROTECTION PRESSURIZED (MISCELLANEOUS) IMPLANT

## 2022-05-27 NOTE — Telephone Encounter (Signed)
CBC repeated today and still shows mild leukocytosis with left-shift.  Given shortness of breath and questionable left lung base atelectasis versus infection, we have agreed to a Z-pak.  CT chest also to be ordered for further evaluation.  Otherwise, continue current meds and f/u as previously arranged.  Nelva Bush, MD [facility]

## 2022-05-27 NOTE — Interval H&P Note (Signed)
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  The various methods of treatment have been discussed with the patient and family. After consideration of risks, benefits and other options for treatment, the patient has consented to  Procedure(s): RIGHT/LEFT HEART CATH AND CORONARY/GRAFT ANGIOGRAPHY (N/A) as a surgical intervention.  The patient's history has been reviewed, patient examined, no change in status, stable for surgery.  I have reviewed the patient's chart and labs.  Questions were answered to the patient's satisfaction.    Cath Lab Visit (complete for each Cath Lab visit)  Clinical Evaluation Leading to the Procedure:   ACS: No.  Non-ACS:    Anginal Classification: CCS III  Anti-ischemic medical therapy: Maximal Therapy (2 or more classes of medications)  Non-Invasive Test Results: No non-invasive testing performed  Prior CABG: Previous CABG  Jacob Harrison

## 2022-05-28 ENCOUNTER — Telehealth: Payer: Self-pay

## 2022-05-28 ENCOUNTER — Encounter: Payer: Self-pay | Admitting: Internal Medicine

## 2022-05-28 DIAGNOSIS — R0602 Shortness of breath: Secondary | ICD-10-CM

## 2022-05-28 NOTE — Telephone Encounter (Signed)
Attempted to contact pt to inform him Dr. Saunders Revel recommends a CT Chest w contrast to assess his SOB. Left message to call back

## 2022-05-28 NOTE — Telephone Encounter (Signed)
Pt made aware and verbalized understanding Message sent to scheduling to arrange testing date

## 2022-05-28 NOTE — Telephone Encounter (Signed)
Pt called to let you know that he had labs completed for Dr. Saunders Revel so he wasn't going to come to have them drawn again.   Lab date was 05/27/22

## 2022-05-29 DIAGNOSIS — M47895 Other spondylosis, thoracolumbar region: Secondary | ICD-10-CM | POA: Diagnosis not present

## 2022-05-29 DIAGNOSIS — M9902 Segmental and somatic dysfunction of thoracic region: Secondary | ICD-10-CM | POA: Diagnosis not present

## 2022-05-29 DIAGNOSIS — M4726 Other spondylosis with radiculopathy, lumbar region: Secondary | ICD-10-CM | POA: Diagnosis not present

## 2022-05-29 DIAGNOSIS — M9903 Segmental and somatic dysfunction of lumbar region: Secondary | ICD-10-CM | POA: Diagnosis not present

## 2022-05-29 DIAGNOSIS — M9904 Segmental and somatic dysfunction of sacral region: Secondary | ICD-10-CM | POA: Diagnosis not present

## 2022-05-29 DIAGNOSIS — M4727 Other spondylosis with radiculopathy, lumbosacral region: Secondary | ICD-10-CM | POA: Diagnosis not present

## 2022-06-03 ENCOUNTER — Ambulatory Visit
Admission: RE | Admit: 2022-06-03 | Discharge: 2022-06-03 | Disposition: A | Discharge status: Home / Self Care | Payer: Medicare HMO | Source: Ambulatory Visit | Attending: Internal Medicine | Admitting: Internal Medicine

## 2022-06-03 DIAGNOSIS — R0602 Shortness of breath: Secondary | ICD-10-CM

## 2022-06-03 DIAGNOSIS — J984 Other disorders of lung: Secondary | ICD-10-CM | POA: Diagnosis not present

## 2022-06-03 DIAGNOSIS — R911 Solitary pulmonary nodule: Secondary | ICD-10-CM | POA: Diagnosis not present

## 2022-06-03 MED ORDER — IOHEXOL 300 MG/ML  SOLN
75.0000 mL | Freq: Once | INTRAMUSCULAR | Status: AC | PRN
  Administered 2022-06-03: 75 mL via INTRAVENOUS

## 2022-06-06 NOTE — Addendum Note (Signed)
Addended by: Meryl Crutch on: 06/06/2022 02:34 PM   Modules accepted: Orders

## 2022-06-11 DIAGNOSIS — M9902 Segmental and somatic dysfunction of thoracic region: Secondary | ICD-10-CM | POA: Diagnosis not present

## 2022-06-11 DIAGNOSIS — M4727 Other spondylosis with radiculopathy, lumbosacral region: Secondary | ICD-10-CM | POA: Diagnosis not present

## 2022-06-11 DIAGNOSIS — M4726 Other spondylosis with radiculopathy, lumbar region: Secondary | ICD-10-CM | POA: Diagnosis not present

## 2022-06-11 DIAGNOSIS — M9903 Segmental and somatic dysfunction of lumbar region: Secondary | ICD-10-CM | POA: Diagnosis not present

## 2022-06-11 DIAGNOSIS — M9904 Segmental and somatic dysfunction of sacral region: Secondary | ICD-10-CM | POA: Diagnosis not present

## 2022-06-11 DIAGNOSIS — M47895 Other spondylosis, thoracolumbar region: Secondary | ICD-10-CM | POA: Diagnosis not present

## 2022-06-13 ENCOUNTER — Encounter: Payer: Self-pay | Admitting: Internal Medicine

## 2022-06-13 ENCOUNTER — Ambulatory Visit: Payer: Medicare HMO | Admitting: Internal Medicine

## 2022-06-13 ENCOUNTER — Other Ambulatory Visit
Admission: RE | Admit: 2022-06-13 | Discharge: 2022-06-13 | Disposition: A | Discharge status: Home / Self Care | Payer: Medicare HMO | Source: Ambulatory Visit | Attending: Internal Medicine | Admitting: Internal Medicine

## 2022-06-13 VITALS — BP 116/70 | HR 75 | Temp 97.7°F | Ht 67.0 in | Wt 163.2 lb

## 2022-06-13 DIAGNOSIS — R0609 Other forms of dyspnea: Secondary | ICD-10-CM | POA: Diagnosis not present

## 2022-06-13 DIAGNOSIS — D72829 Elevated white blood cell count, unspecified: Secondary | ICD-10-CM

## 2022-06-13 LAB — CBC WITH DIFFERENTIAL/PLATELET
Abs Immature Granulocytes: 0.1 10*3/uL — ABNORMAL HIGH (ref 0.00–0.07)
Basophils Absolute: 0.1 10*3/uL (ref 0.0–0.1)
Basophils Relative: 1 %
Eosinophils Absolute: 0.2 10*3/uL (ref 0.0–0.5)
Eosinophils Relative: 2 %
HCT: 40 % (ref 39.0–52.0)
Hemoglobin: 14 g/dL (ref 13.0–17.0)
Immature Granulocytes: 1 %
Lymphocytes Relative: 28 %
Lymphs Abs: 3.6 10*3/uL (ref 0.7–4.0)
MCH: 32.1 pg (ref 26.0–34.0)
MCHC: 35 g/dL (ref 30.0–36.0)
MCV: 91.7 fL (ref 80.0–100.0)
Monocytes Absolute: 1.2 10*3/uL — ABNORMAL HIGH (ref 0.1–1.0)
Monocytes Relative: 10 %
Neutro Abs: 7.7 10*3/uL (ref 1.7–7.7)
Neutrophils Relative %: 58 %
Platelets: 306 10*3/uL (ref 150–400)
RBC: 4.36 MIL/uL (ref 4.22–5.81)
RDW: 13.9 % (ref 11.5–15.5)
WBC: 12.9 10*3/uL — ABNORMAL HIGH (ref 4.0–10.5)
nRBC: 0 % (ref 0.0–0.2)

## 2022-06-13 LAB — SEDIMENTATION RATE: Sed Rate: 26 mm/hr — ABNORMAL HIGH (ref 0–20)

## 2022-06-13 LAB — TSH: TSH: 2.734 u[IU]/mL (ref 0.350–4.500)

## 2022-06-13 LAB — D-DIMER, QUANTITATIVE: D-Dimer, Quant: 0.66 ug/mL-FEU — ABNORMAL HIGH (ref 0.00–0.50)

## 2022-06-13 MED ORDER — PANTOPRAZOLE SODIUM 40 MG PO TBEC: 40.0000 mg | DELAYED_RELEASE_TABLET | Freq: Every day | ORAL | 2 refills | Status: DC

## 2022-06-13 MED ORDER — FAMOTIDINE 20 MG PO TABS: ORAL_TABLET | ORAL | 11 refills | Status: DC

## 2022-06-13 NOTE — Progress Notes (Unsigned)
Jacob Harrison, male    DOB: 11-Dec-1940   MRN: [id number]   Brief patient profile:  14  yowm grew up with lots of smoke exp and ear infections but got it once got out of house no trouble referred to pulmonary clinic in [facility]  06/13/2022 by Dr End  for unexplained doe/cough in setting of lisinopril cough hx.       History of Present Illness  06/13/2022  Pulmonary/ 1st office eval/ Jacob Harrison / Jacob Harrison  Chief Complaint  Patient presents with   Consult    SOB when exercising. Going on for 3-4 months. Dry cough and congestion in the last 2 weeks.   Despite two bypasses and a dx chf got better to point of able recumbent bike 30 min 3 x per week and walking x 15-20 min q am s sob until 3-4 m  prior to OV  >  by time of ov only walks 5 min  Dyspnea:  much worse since nasal congestion /cough x 2 weeks prior to OV  rx otc  Cough: clear some worse at hs  Sleep: cough immediately on lying down 45 degrees then uses cough drop  SABA use: none   No obvious day to day or daytime pattern/variability or assoc excess/ purulent sputum or mucus plugs or hemoptysis or cp or chest tightness, subjective wheeze or overt sinus or hb symptoms.   Sleeping as above  without nocturnal  or early am exacerbation  of respiratory  c/o's or need for noct saba. Also denies any obvious fluctuation of symptoms with weather or environmental changes or other aggravating or alleviating factors except as outlined above   No unusual exposure hx or h/o childhood pna/ asthma or knowledge of premature birth.  Current Allergies, Complete Past Medical History, Past Surgical History, Family History, and Social History were reviewed in [employer] record.  ROS  The following are not active complaints unless bolded Hoarseness, sore throat, dysphagia, dental problems, itching, sneezing,  nasal congestion or discharge of excess mucus or purulent secretions, ear ache,   fever, chills, sweats,  unintended wt loss or wt gain, classically pleuritic or exertional cp,  orthopnea pnd or arm/hand swelling  or leg swelling, presyncope, palpitations, abdominal pain, anorexia, nausea, vomiting, diarrhea  or change in bowel habits or change in bladder habits, change in stools or change in urine, dysuria, hematuria,  rash, arthralgias, visual complaints, headache, numbness, weakness or ataxia or problems with walking or coordination,  change in mood or  memory.           Past Medical History:  Diagnosis Date   (HFpEF) heart failure with preserved ejection fraction ([place])    a. 10/2016 Echo: EF 65-70%, mild LVH, mildly dil LA. RV fxn nl.   Arthritis    BPH (benign prostatic hyperplasia)    Coronary artery disease    a. 2001 CABG x 3 [facility]): LIMA->LAD, VG->LCX, VG->RPDA; b. 04/05/2010 Cath ([facility]): LM 17m LAD 1030mLCX 4042mCA 100p, LIMA->LAD atretic, VG->LCX 100, VG->RCA 80ost, 70d; c. 04/2010 Redo CABG x 2 ([place]): VG->LAD, VG->RPDA; d. 10/2012 Cath ([facility] 20-30, LAD 100/95-62m71mX 40-16m,1m 100p, 80-100d, RPDA 99, RPL1 95, VG->LAD ok, VG->RPDA ok, EF 67%; e. 11/2016 MV: EF 48%, No ischemia.   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 03/2021 s/p MDT [facility] AF MRI VR SureScan  single lead AICD (ser# [id number]).   Scoliosis    Spinal stenosis     Outpatient Medications Prior to Visit  Medication Sig Dispense Refill   acetaminophen (TYLENOL) 500 MG tablet Take 1,000 mg by mouth every 6 (six) hours as needed for moderate pain or headache.     aspirin EC 81 MG tablet Take 1 tablet (81 mg total) by mouth daily.     carvedilol (COREG) 12.5 MG tablet Take 0.5 tablets (6.25 mg total) by mouth 2 (two) times daily with a meal. 180 tablet 3   Cholecalciferol (VITAMIN D-3 PO) Take 800 Units by mouth daily.     clopidogrel (PLAVIX) 75 MG tablet TAKE 1 TABLET (75 MG TOTAL) BY MOUTH DAILY. **NO PLAVIX UNTIL  12/20** 90 tablet 0   Coenzyme Q10 (CO Q-10) 200 MG CAPS Take 200 mg by mouth daily.      cyanocobalamin 500 MCG tablet Take 500 mcg by mouth daily.     diclofenac Sodium (VOLTAREN) 1 % GEL Apply 2 g topically 2 (two) times daily as needed. 120 g 0   empagliflozin (JARDIANCE) 10 MG TABS tablet TAKE 1 TABLET BY MOUTH DAILY BEFORE BREAKFAST. 30 tablet 1   ezetimibe (ZETIA) 10 MG tablet TAKE 1 TABLET BY MOUTH EVERY DAY 90 tablet 1   finasteride (PROSCAR) 5 MG tablet TAKE 1 TABLET BY MOUTH EVERY DAY 90 tablet 1   furosemide (LASIX) 40 MG tablet TAKE 1 TABLET BY MOUTH EVERY DAY 90 tablet 0   Glucosamine-Chondroit-Vit C-Mn (GLUCOSAMINE 1500 COMPLEX) CAPS Take 1 capsule by mouth daily.     isosorbide mononitrate (IMDUR) 30 MG 24 hr tablet Take 1 tablet (30 mg total) by mouth 2 (two) times daily. 180 tablet 3   Lancets (ONETOUCH ULTRASOFT) lancets Use to check blood sugars fasting daily 100 each 12   losartan (COZAAR) 25 MG tablet Take 0.5 tablets (12.5 mg total) by mouth daily. 90 tablet 3   Multiple Vitamins-Minerals (PRESERVISION AREDS 2 PO) Take 1 capsule by mouth 2 (two) times a day.     nitroGLYCERIN (NITROSTAT) 0.4 MG SL tablet PLACE 1 TABLET UNDER THE TONGUE EVERY 5 MINUTES AS NEEDED. 25 tablet 0   ranolazine (RANEXA) 1000 MG SR tablet TAKE 1 TABLET BY MOUTH TWICE A DAY 180 tablet 0   rosuvastatin (CRESTOR) 20 MG tablet TAKE 1 TABLET BY MOUTH EVERYDAY AT BEDTIME 90 tablet 1       0       Objective:     BP 116/70 (BP Location: Left Arm, Cuff Size: Normal)   Pulse 75   Temp 97.7 F (36.5 C)   Ht '5\' 7"'$  (1.702 m)   Wt 163 lb 3.2 oz (74 kg)   SpO2 97%   BMI 25.56 kg/m   SpO2: 97 %  Slt hoarse pleasant amb wm nad   HEENT : Oropharynx  clear      Nasal turbinates nl    NECK :  without  apparent JVD/ palpable Nodes/TM    LUNGS: no acc muscle use,  scoliotic  contour chest which is clear to A and P bilaterally with a few crackles in bases on insp/ no wheeze    CV:  RRR  no s3 or  murmur or increase in P2, and 1+ pitting both LE slt worse on L preceded doe x sev years  ABD:  soft and nontender with nl inspiratory excursion in the supine position. No bruits or organomegaly appreciated   MS:  Nl gait/ ext  warm without deformities Or obvious joint restrictions  calf tenderness, cyanosis or clubbing    SKIN: warm and dry without lesions    NEURO:  alert, approp, nl sensorium with  no motor or cerebellar deficits apparent.   Labs ordered/ reviewed:      Chemistry      Component Value Date/Time   NA 136 05/27/2022 1107   NA 138 02/17/2022 1018   K 3.7 05/27/2022 1107   CL 97 (L) 05/22/2022 1043   CO2 26 05/22/2022 1043   BUN 20 05/22/2022 1043   BUN 16 02/17/2022 1018   CREATININE 0.96 05/22/2022 1043      Component Value Date/Time   CALCIUM 9.3 05/22/2022 1043   ALKPHOS 83 02/17/2022 1018   AST 24 02/17/2022 1018   ALT 23 02/17/2022 1018   BILITOT 0.9 02/17/2022 1018        Lab Results  Component Value Date   WBC 12.9 (H) 06/13/2022   HGB 14.0 06/13/2022   HCT 40.0 06/13/2022   MCV 91.7 06/13/2022   PLT 306 06/13/2022       EOS                                                              0.2                                   06/13/2022   Lab Results  Component Value Date   DDIMER 0.66 (H) 06/13/2022      Lab Results  Component Value Date   TSH 2.734 06/13/2022     No results found for: "PROBNP"     Lab Results  Component Value Date   ESRSEDRATE 26 (H) 06/13/2022        I personally reviewed images and agree with radiology impression as follows:   Chest CT2/27/24    with contrast Left hemithorax volume loss with pleural thickening and pleural calcifications without effusion.   Bilateral lung areas of interstitial septal thickening, scarring fibrotic changes as well as some ground-glass particularly along the lower lung zones.     Small hiatal hernia.        Assessment   Dyspnea on exertion Onset was around thxgiving 2023 in  setting of chf and scoliosis with L hemithorax vol looss assoc with pleural thickening and calcification  - h/o acei intol (cough)  - assoc with hoarseness s overt sinus or GERD symptoms - LHC/RHC  05/27/22 nl pressures, no new coronary dz - 06/13/2022   Walked on RA525  x  3  lap(s) =  approx 525  ft  @ mod pace, stopped due to end of study but c/o mod sob  with lowest 02 sats 96%    D dimer  high normal value (seen commonly in the elderly or chronically ill)  may miss small peripheral pe, the clot burden with sob is moderately high and the d dimer  here has a very high neg pred value if used in this setting.    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   These are the same pts (now being labeled as having "irritable larynx syndrome" by some cough centers) who not infrequently have a history of having failed to tolerate ace inhibitors (as was the case here)   dry powder inhalers or biphosphonates or report having atypical/extraesophageal reflux symptoms(as may be the case here)  that don't respond to standard doses of PPI  and are easily confused as having aecopd or asthma flares by even experienced allergists/ pulmonologists (myself included).   Also suspect deconditioning at this point so rec  Max rx for gerd/ sub max exercise for now and f/u in 6 weeks,  sooner if needed  Each maintenance medication was reviewed in detail including emphasizing most importantly the difference between maintenance and prns and under what circumstances the prns are to be triggered using an action plan format where appropriate.  Total time for H and P, chart review, counseling,  directly observing portions of ambulatory 02 saturation study/ and generating customized AVS unique to this office visit / same day charting  > 50mn with new pt with multiple  refractory respiratory  symptoms of uncertain etiology                MChristinia Gully MD 06/13/2022

## 2022-06-13 NOTE — Patient Instructions (Addendum)
To get the most out of exercise, you need to be continuously aware that you are short of breath, but never out of breath, for at least 30 minutes daily. As you improve, it will actually be easier for you to do the same amount of exercise  in  30 minutes so always push to the level where you are short of breath.     Make sure you check your oxygen saturations at highest level of activity   If not improving after 4 weeks will need to consider a CPST in [place]   Pantoprazole (protonix) 40 mg   Take  30-60 min before first meal of the day and Pepcid (famotidine)  20 mg after supper until return to office - this is the best way to tell whether stomach acid is contributing to your problem.    GERD (REFLUX)  is an extremely common cause of respiratory symptoms just like yours , many times with no obvious heartburn at all.    It can be treated with medication, but also with lifestyle changes including elevation of the head of your bed (ideally with 6 -8inch blocks under the headboard of your bed),  Smoking cessation, avoidance of late meals, excessive alcohol, and avoid fatty foods, chocolate, peppermint, colas, red wine, and acidic juices such as orange juice.  NO MINT OR MENTHOL PRODUCTS SO NO COUGH DROPS  USE SUGARLESS CANDY INSTEAD (Jolley ranchers or Stover's or Life Savers) or even ice chips will also do - the key is to swallow to prevent all throat clearing. NO OIL BASED VITAMINS - use powdered substitutes.  Avoid fish oil when coughing.    Please remember to go to the lab department   for your tests - we will call you with the results when they are available.  Please schedule a follow up office visit in 6 weeks, call sooner if needed

## 2022-06-14 ENCOUNTER — Encounter: Payer: Self-pay | Admitting: Internal Medicine

## 2022-06-14 NOTE — Assessment & Plan Note (Addendum)
Onset was around thxgiving 2023 in setting of chf and scoliosis with L hemithorax vol looss assoc with pleural thickening and calcification  - h/o acei intol (cough)  - assoc with hoarseness s overt sinus or GERD symptoms - LHC/RHC  05/27/22 nl pressures, no new coronary dz - 06/13/2022   Walked on RA525  x  3  lap(s) =  approx 525  ft  @ mod pace, stopped due to end of study but c/o mod sob  with lowest 02 sats 96%    D dimer  high normal value (seen commonly in the elderly or chronically ill)  may miss small peripheral pe, the clot burden with sob is moderately high and the d dimer  here has a very high neg pred value if used in this setting.    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   These are the same pts (now being labeled as having "irritable larynx syndrome" by some cough centers) who not infrequently have a history of having failed to tolerate ace inhibitors (as was the case here)   dry powder inhalers or biphosphonates or report having atypical/extraesophageal reflux symptoms(as may be the case here)  that don't respond to standard doses of PPI  and are easily confused as having aecopd or asthma flares by even experienced allergists/ pulmonologists (myself included).   Also suspect deconditioning at this point so rec  Max rx for gerd/ sub max  exercise for now and f/u in 6 weeks, sooner if needed  Each maintenance medication was reviewed in detail including emphasizing most importantly the difference between maintenance and prns and under what circumstances the prns are to be triggered using an action plan format where appropriate.  Total time for H and P, chart review, counseling,  directly observing portions of ambulatory 02 saturation study/ and generating customized AVS unique to this office visit / same day charting  > 24mn with new pt with multiple  refractory respiratory  symptoms of uncertain etiology

## 2022-06-18 ENCOUNTER — Ambulatory Visit (INDEPENDENT_AMBULATORY_CARE_PROVIDER_SITE_OTHER): Payer: Medicare HMO

## 2022-06-18 DIAGNOSIS — M4725 Other spondylosis with radiculopathy, thoracolumbar region: Secondary | ICD-10-CM | POA: Diagnosis not present

## 2022-06-18 DIAGNOSIS — M9903 Segmental and somatic dysfunction of lumbar region: Secondary | ICD-10-CM | POA: Diagnosis not present

## 2022-06-18 DIAGNOSIS — I255 Ischemic cardiomyopathy: Secondary | ICD-10-CM | POA: Diagnosis not present

## 2022-06-18 DIAGNOSIS — M4727 Other spondylosis with radiculopathy, lumbosacral region: Secondary | ICD-10-CM | POA: Diagnosis not present

## 2022-06-18 DIAGNOSIS — M4726 Other spondylosis with radiculopathy, lumbar region: Secondary | ICD-10-CM | POA: Diagnosis not present

## 2022-06-18 DIAGNOSIS — M9902 Segmental and somatic dysfunction of thoracic region: Secondary | ICD-10-CM | POA: Diagnosis not present

## 2022-06-18 DIAGNOSIS — M9904 Segmental and somatic dysfunction of sacral region: Secondary | ICD-10-CM | POA: Diagnosis not present

## 2022-06-19 ENCOUNTER — Ambulatory Visit: Payer: Medicare HMO | Admitting: Dermatology

## 2022-06-19 VITALS — BP 85/50 | HR 65

## 2022-06-19 DIAGNOSIS — L2081 Atopic neurodermatitis: Secondary | ICD-10-CM

## 2022-06-19 DIAGNOSIS — L814 Other melanin hyperpigmentation: Secondary | ICD-10-CM

## 2022-06-19 DIAGNOSIS — Z7189 Other specified counseling: Secondary | ICD-10-CM | POA: Diagnosis not present

## 2022-06-19 DIAGNOSIS — L578 Other skin changes due to chronic exposure to nonionizing radiation: Secondary | ICD-10-CM | POA: Diagnosis not present

## 2022-06-19 DIAGNOSIS — Z79899 Other long term (current) drug therapy: Secondary | ICD-10-CM | POA: Diagnosis not present

## 2022-06-19 DIAGNOSIS — D692 Other nonthrombocytopenic purpura: Secondary | ICD-10-CM | POA: Diagnosis not present

## 2022-06-19 DIAGNOSIS — L853 Xerosis cutis: Secondary | ICD-10-CM | POA: Diagnosis not present

## 2022-06-19 DIAGNOSIS — L821 Other seborrheic keratosis: Secondary | ICD-10-CM

## 2022-06-19 DIAGNOSIS — B353 Tinea pedis: Secondary | ICD-10-CM | POA: Diagnosis not present

## 2022-06-19 LAB — CUP PACEART REMOTE DEVICE CHECK
Battery Remaining Longevity: 123 mo
Battery Voltage: 3.04 V
Brady Statistic RV Percent Paced: 0.29 %
Date Time Interrogation Session: 20240312085637
HighPow Impedance: 61 Ohm
Implantable Lead Connection Status: 753985
Implantable Lead Implant Date: 20221214
Implantable Lead Location: 753860
Implantable Pulse Generator Implant Date: 20221214
Lead Channel Impedance Value: 304 Ohm
Lead Channel Impedance Value: 437 Ohm
Lead Channel Pacing Threshold Amplitude: 0.875 V
Lead Channel Pacing Threshold Pulse Width: 0.4 ms
Lead Channel Sensing Intrinsic Amplitude: 14.75 mV
Lead Channel Sensing Intrinsic Amplitude: 14.75 mV
Lead Channel Setting Pacing Amplitude: 1.5 V
Lead Channel Setting Pacing Pulse Width: 0.4 ms
Lead Channel Setting Sensing Sensitivity: 0.3 mV
Zone Setting Status: 755011
Zone Setting Status: 755011

## 2022-06-19 LAB — IGE: IgE (Immunoglobulin E), Serum: 4 IU/mL — ABNORMAL LOW (ref 6–495)

## 2022-06-19 MED ORDER — TERBINAFINE HCL 250 MG PO TABS: 250.0000 mg | ORAL_TABLET | Freq: Every day | ORAL | 0 refills | Status: DC

## 2022-06-19 MED ORDER — TRIAMCINOLONE ACETONIDE 0.1 % EX CREA: 1.0000 | TOPICAL_CREAM | CUTANEOUS | 1 refills | Status: AC

## 2022-06-19 NOTE — Patient Instructions (Addendum)
Start Lamisil '250mg'$  1 pill a day for 1 month Start Triamcinoline 0.1% cream once a day up to 5 days a week to itchy rash on body, avoid using on face, under arms and in groin Start Cerave cream to body once daily Start Claritin 1 pill a day until you return to office    Due to recent changes in healthcare laws, you may see results of your pathology and/or laboratory studies on MyChart before the doctors have had a chance to review them. We understand that in some cases there may be results that are confusing or concerning to you. Please understand that not all results are received at the same time and often the doctors may need to interpret multiple results in order to provide you with the best plan of care or course of treatment. Therefore, we ask that you please give [place] 2 business days to thoroughly review all your results before contacting the office for clarification. Should we see a critical lab result, you will be contacted sooner.   If You Need Anything After Your Visit  If you have any questions or concerns for your doctor, please call our main line at [phone] and press option 4 to reach your doctor's medical assistant. If no one answers, please leave a voicemail as directed and we will return your call as soon as possible. Messages left after 4 pm will be answered the following business day.   You may also send [place] a message via [place]. We typically respond to MyChart messages within 1-2 business days.  For prescription refills, please ask your pharmacy to contact our office. Our fax number is [phone].  If you have an urgent issue when the clinic is closed that cannot wait until the next business day, you can page your doctor at the number below.    Please note that while we do our best to be available for urgent issues outside of office hours, we are not available 24/7.   If you have an urgent issue and are unable to reach [place], you may choose to seek medical care at your  doctor's office, retail clinic, urgent care center, or emergency room.  If you have a medical emergency, please immediately call 911 or go to the emergency department.  Pager Numbers  - Dr. Nehemiah Massed: [phone]  - Dr. Laurence Ferrari: [phone]  - Dr. Nicole Kindred: [phone]  In the event of inclement weather, please call our main line at [phone] for an update on the status of any delays or closures.  Dermatology Medication Tips: Please keep the boxes that topical medications come in in order to help keep track of the instructions about where and how to use these. Pharmacies typically print the medication instructions only on the boxes and not directly on the medication tubes.   If your medication is too expensive, please contact our office at [phone] option 4 or send [place] a message through [place].   We are unable to tell what your co-pay for medications will be in advance as this is different depending on your insurance coverage. However, we may be able to find a substitute medication at lower cost or fill out paperwork to get insurance to cover a needed medication.   If a prior authorization is required to get your medication covered by your insurance company, please allow [place] 1-2 business days to complete this process.  Drug prices often vary depending on where the prescription is filled and some pharmacies may offer cheaper prices.  The website  www.goodrx.com contains coupons for medications through different pharmacies. The prices here do not account for what the cost may be with help from insurance (it may be cheaper with your insurance), but the website can give you the price if you did not use any insurance.  - You can print the associated coupon and take it with your prescription to the pharmacy.  - You may also stop by our office during regular business hours and pick up a GoodRx coupon card.  - If you need your prescription sent electronically to a different pharmacy, notify  our office through [facility] or by phone at [phone] option 4.     Si Usted Necesita Algo Despus de Su Visita  Tambin puede enviarnos un mensaje a travs de Pharmacist, community. Por lo general respondemos a los mensajes de MyChart en el transcurso de 1 a 2 das hbiles.  Para renovar recetas, por favor pida a su farmacia que se ponga en contacto con nuestra oficina. Harland Dingwall de fax es [place] [phone].  Si tiene un asunto urgente cuando la clnica est cerrada y que no puede esperar hasta el siguiente da hbil, puede llamar/localizar a su doctor(a) al nmero que aparece a continuacin.   Por favor, tenga en cuenta que aunque hacemos todo lo posible para estar disponibles para asuntos urgentes fuera del horario de [place], no estamos disponibles las 24 horas del da, los 7 das de la [place].   Si tiene un problema urgente y no puede comunicarse con nosotros, puede optar por buscar atencin mdica  en el consultorio de su doctor(a), en una clnica privada, en un centro de atencin urgente o en una sala de emergencias.  Si tiene Engineering geologist, por favor llame inmediatamente al 911 o vaya a la sala de emergencias.  Nmeros de bper  - Dr. Nehemiah Massed: [phone]  - Dra. Moye: [phone]  - Dra. Nicole Kindred: [phone]  En caso de inclemencias del [place], por favor llame a Johnsie Kindred principal al [phone] para una actualizacin sobre el [place] de cualquier retraso o cierre.  Consejos para la medicacin en dermatologa: Por favor, guarde las cajas en las que vienen los medicamentos de uso tpico para ayudarle a seguir las instrucciones sobre dnde y cmo usarlos. Las farmacias generalmente imprimen las instrucciones del medicamento slo en las cajas y no directamente en los tubos del [place].   Si su medicamento es muy caro, por favor, pngase en contacto con Zigmund Daniel llamando al [phone] y presione la opcin 4 o envenos un mensaje a travs de  Pharmacist, community.   No podemos decirle cul ser su copago por los medicamentos por adelantado ya que esto es diferente dependiendo de la cobertura de su seguro. Sin embargo, es posible que podamos encontrar un medicamento sustituto a Electrical engineer un formulario para que el seguro cubra el medicamento que se considera necesario.   Si se requiere una autorizacin previa para que su compaa de seguros [place] su medicamento, por favor permtanos de 1 a 2 das hbiles para completar este proceso.  Los precios de los medicamentos varan con frecuencia dependiendo del Environmental consultant de dnde se surte la receta y alguna farmacias pueden ofrecer precios ms baratos.  El sitio web www.goodrx.com tiene cupones para medicamentos de Airline pilot. Los precios aqu no tienen en cuenta lo que podra costar con la ayuda del seguro (puede ser ms barato con su seguro), pero el sitio web puede darle el precio si no utiliz Research scientist (physical sciences).  - Puede imprimir  el cupn correspondiente y llevarlo con su receta a la farmacia.  - Tambin puede pasar por nuestra oficina durante el horario de atencin regular y Charity fundraiser una tarjeta de cupones de GoodRx.  - Si necesita que su receta se enve electrnicamente a una farmacia diferente, informe a nuestra oficina a travs de MyChart de [place] o por telfono llamando al [phone] y presione la opcin 4.

## 2022-06-19 NOTE — Progress Notes (Signed)
New Patient Visit  Subjective  Jacob Harrison is a 82 y.o. male who presents for the following: Pruritis (Back mostly, sometimes sides, L shoulder, feet, 36m, no treatment). The patient has spots, moles and lesions to be evaluated, some may be new or changing and the patient has concerns that these could be cancer.  The following portions of the chart were reviewed this encounter and updated as appropriate:   Tobacco  Allergies  Meds  Problems  Med Hx  Surg Hx  Fam Hx     Review of Systems:  No other skin or systemic complaints except as noted in HPI or Assessment and Plan.  Objective  Well appearing patient in no apparent distress; mood and affect are within normal limits.  A focused examination was performed including all skin waist up and feet examined. Relevant physical exam findings are noted in the Assessment and Plan.  back Scaly patch spinal back  bil feet Scaling feet   Assessment & Plan   Xerosis - diffuse xerotic patches - recommend gentle, hydrating skin care - gentle skin care handout given   Purpura - Chronic; persistent and recurrent.  Treatable, but not curable. - Violaceous macules and patches - Benign - Related to trauma, age, sun damage and/or use of blood thinners, chronic use of topical and/or oral steroids - Observe - Can use OTC arnica containing moisturizer such as Dermend Bruise Formula if desired - Call for worsening or other concerns   Seborrheic Keratoses - Stuck-on, waxy, tan-brown papules and/or plaques  - Benign-appearing - Discussed benign etiology and prognosis. - Observe - Call for any changes - trunk  Lentigines - Scattered tan macules - Due to sun exposure - Benign-appearing, observe - Recommend daily broad spectrum sunscreen SPF 30+ to sun-exposed areas, reapply every 2 hours as needed. - Call for any changes  - upper back  Atopic neurodermatitis back  Atopic dermatitis (eczema) is a chronic, relapsing,  pruritic condition that can significantly affect quality of life. It is often associated with allergic rhinitis and/or asthma and can require treatment with topical medications, phototherapy, or in severe cases biologic injectable medication (Dupixent; Adbry) or Oral JAK inhibitors.   Labs from 06/13/22 and 02/17/22 reviewed, CBC, thyroid, liver all normal  Start TMC 0.1% cr qd up to 5d/wk itchy areas on body until clear, avoid f/g/a Start Cerave cream qd Start Claritin 1 po qd   Topical steroids (such as triamcinolone, fluocinolone, fluocinonide, mometasone, clobetasol, halobetasol, betamethasone, hydrocortisone) can cause thinning and lightening of the skin if they are used for too long in the same area. Your physician has selected the right strength medicine for your problem and area affected on the body. Please use your medication only as directed by your physician to prevent side effects.    triamcinolone cream (KENALOG) 0.1 % - back Apply 1 Application topically as directed. 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 Condition is symptomatic / bothersome to patient. Not to goal.   Labs from 06/13/22 and 02/17/22 reviewed, CBC, thyroid, liver all normal  Start Lamisil 250mg  1 po qd x 1 month  Terbinafine Counseling  Terbinafine is an anti-fungal medicine that can be applied to the skin (over the counter) or taken by mouth (prescription) to treat fungal infections. The pill version is often used to treat  fungal infections of the nails or scalp. While most people do not have any side effects from taking terbinafine pills, some possible side effects of the medicine can include taste changes, headache, loss of smell, vision changes, nausea, vomiting, or diarrhea.   Rare side effects can include irritation of the liver, allergic  reaction, or decrease in blood counts (which may show up as not feeling well or developing an infection). If you are concerned about any of these side effects, please stop the medicine and call your doctor, or in the case of an emergency such as feeling very unwell, seek immediate medical care.    terbinafine (LAMISIL) 250 MG tablet - bil feet Take 1 tablet (250 mg total) by mouth daily.  Long-term use of high-risk medication  Medication management  Actinic skin damage  Lentigo  Counseling and coordination of care  Purpura ([place])  Seborrheic keratosis  Xerosis cutis   Return in about 6 weeks (around 07/31/2022) for Tinea f/u, AD f/u.  I, Othelia Pulling, RMA, am acting as scribe for Sarina Ser, MD . Documentation: I have reviewed the above documentation for accuracy and completeness, and I agree with the above.  Sarina Ser, MD

## 2022-06-21 ENCOUNTER — Encounter: Payer: Self-pay | Admitting: Dermatology

## 2022-06-21 ENCOUNTER — Other Ambulatory Visit: Payer: Self-pay | Admitting: Internal Medicine

## 2022-06-22 ENCOUNTER — Other Ambulatory Visit: Payer: Self-pay | Admitting: Internal Medicine

## 2022-06-23 ENCOUNTER — Ambulatory Visit: Payer: Medicare HMO | Attending: Medical | Admitting: Medical

## 2022-06-23 ENCOUNTER — Encounter: Payer: Self-pay | Admitting: Medical

## 2022-06-23 VITALS — BP 106/60 | HR 67 | Ht 67.0 in | Wt 162.2 lb

## 2022-06-23 DIAGNOSIS — I5022 Chronic systolic (congestive) heart failure: Secondary | ICD-10-CM

## 2022-06-23 DIAGNOSIS — E785 Hyperlipidemia, unspecified: Secondary | ICD-10-CM

## 2022-06-23 DIAGNOSIS — I255 Ischemic cardiomyopathy: Secondary | ICD-10-CM | POA: Diagnosis not present

## 2022-06-23 DIAGNOSIS — I25118 Atherosclerotic heart disease of native coronary artery with other forms of angina pectoris: Secondary | ICD-10-CM

## 2022-06-23 DIAGNOSIS — E1169 Type 2 diabetes mellitus with other specified complication: Secondary | ICD-10-CM | POA: Diagnosis not present

## 2022-06-23 MED ORDER — EMPAGLIFLOZIN 10 MG PO TABS: 10.0000 mg | ORAL_TABLET | Freq: Every day | ORAL | 0 refills | Status: DC

## 2022-06-23 NOTE — Patient Instructions (Signed)
Medication Instructions:   Your physician recommends that you continue on your current medications as directed. Please refer to the Current Medication list given to you today.  *If you need a refill on your cardiac medications before your next appointment, please call your pharmacy*   Lab Work:  NONE  If you have labs (blood work) drawn today and your tests are completely normal, you will receive your results only by: [place] (if you have MyChart) OR A paper copy in the mail If you have any lab test that is abnormal or we need to change your treatment, we will call you to review the results.   Testing/Procedures:  NONE   Follow-Up: At [facility], you and your health needs are our priority.  As part of our continuing mission to provide you with exceptional heart care, we have created designated Provider Care Teams.  These Care Teams include your primary Cardiologist (physician) and Advanced Practice Providers (APPs -  Physician Assistants and Nurse Practitioners) who all work together to provide you with the care you need, when you need it.  We recommend signing up for the patient portal called "MyChart".  Sign up information is provided on this After Visit Summary.  MyChart is used to connect with patients for Virtual Visits (Telemedicine).  Patients are able to view lab/test results, encounter notes, upcoming appointments, etc.  Non-urgent messages can be sent to your provider as well.   To learn more about what you can do with MyChart, go to [email or web address].    Your next appointment:   4 month(s)  Provider:   You may see Nelva Bush, MD or one of the following Advanced Practice Providers on your designated Care Team:   Murray Hodgkins, NP Christell Faith, PA-C Cadence Kathlen Mody, PA-C Gerrie Nordmann, NP

## 2022-06-23 NOTE — Progress Notes (Signed)
Cardiology Office Note:    Date:  06/23/2022   ID:  Jacob Harrison, DOB [birth year], MRN [id number]  PCP:  Velva Harman, PA  [facility] Cardiologist:  Nelva Bush, MD  [facility] Electrophysiologist:  Vickie Epley, MD   Referring MD: Velva Harman, PA   Chief Complaint: Cardiac cath follow-up  History of Present Illness:    Jacob Harrison is a 82 y.o. male with a hx of CAD s/p CABG and redo CABG, DM2, HFpEF, HLD, chronic leg pain due to spinal stenosis, ICM, HFrEF who presents for follow-up.   Seen in November 2020 with bradycardia recommended to cut back on Carvedilol. A soft systolic murmur was noted and echocardiogram ordered. He was started on Lasix 20mg  daily for pretibial edema. Echo 09/23/19 with LVEF 60-65%, no RWMA, G1DD, RV normal size and function, moderate AV sclerosis without stenosis.  Reviewed in depth during clinic visit.  The patient had NSTEMI 09/2020 at which time cardiac cath showed LMCA with mild diffuse disease, LAD with moderate diffuse disease and chronic subtotal occlusion of the mid vessel. Distal LAD fills viz left to right collaterals. LCX with moderate mid vessel disease, RCA with CTO, filling L>R collaterals, SVG to RPDA,LAD, and OM with CTO, LIMA to LAD known to be atretic. LVEDP 66mmHg. Recommendations for medical therapy.    Seen 11/14/20 and denied angina. BP was soft. Recommended discontinuation of Imdur if EF was low on Echo. Echo showed LVEF 30-35%. Lasix was increased. He was seen 10/10 and had hypotension. Repeat echo was ordered, which showed persistently low EF 30-35%, and he was sent to EP for ICD consideration. He saw EP 03/06/21 for ICD consideration and underwent implantation 03/20/21.    Patient was last seen May 22, 2022 with progressive unstable angina and shortness of breath despite increase in Imdur. He reported some improvement of symptoms.  Patient was ultimately set up for right and left heart cath.   Chest x-ray was ordered for crackles on exam.  Labs were ordered.  Right and left heart cath showed severe multivessel CAD, including chronic total occlusion of mid LAD and proximal RCA.  There was known moderate LMCA and left circumflex disease, overall appearance similar to prior cath in 2020.  Known occlusions of bypass grafts, occlusion of SVG to LAD and SVG to RCA.  He had normal left heart and right heart filling pressures.  Coreg was decreased to 6.25 mg twice daily given low BP, bradycardia and fatigue.  Today, heart cath was briefly discussed.  Cath site, right radial site, is stable.  Patient is overall feeling better since the cardiac cath. He reports reduced chest pain and shortness of breath episodes. Patient is taking only 3-4 NTG SL a month.  He is taking his 30 mg Imdur twice a day.  Patient is able to walk more and spend more time on the recumbent bike.  Blood pressure is improved today at 106/69 pulse improved at 67 bpm.  Patient reports dependent left foot swelling.  He is taking Lasix 40 mg daily.  Patient saw pulmonology for orthopnea and they started him on Pepcid with improvement of symptoms.  Patient denies lightheadedness and dizziness.  Past Medical History:  Diagnosis Date   (HFpEF) heart failure with preserved ejection fraction ([place])    a. 10/2016 Echo: EF 65-70%, mild LVH, mildly dil LA. RV fxn nl.   Arthritis    BPH (benign prostatic hyperplasia)    Coronary artery disease    a.  2001 CABG x 3 [facility]): LIMA->LAD, VG->LCX, VG->RPDA; b. 04/05/2010 Cath ([place]): LM 40m, LAD 178m, LCX 80m, RCA 100p, LIMA->LAD atretic, VG->LCX 100, VG->RCA 80ost, 70d; c. 04/2010 Redo CABG x 2 ([place]): VG->LAD, VG->RPDA; d. 10/2012 Cath [facility]): LM 20-30, LAD 100/95-40m, LCX 40-34m, RCA 100p, 80-100d, RPDA 99, RPL1 95, VG->LAD ok, VG->RPDA ok, EF 67%; e. 11/2016 MV: EF 48%, No ischemia.   Diabetes mellitus without complication ([place])    Type II   Hyperlipidemia    Hypertension    Ischemic  cardiomyopathy    a. 03/2021 s/p MDT Visia AF MRI VR SureScan single lead AICD (ser# [id number]).   Scoliosis    Spinal stenosis     Past Surgical History:  Procedure Laterality Date   CARDIAC CATHETERIZATION     CORONARY ARTERY BYPASS GRAFT     x 2 (2001 and redo in late 2011 or early 2012)   HERNIA REPAIR Left    inguinal   ICD IMPLANT N/A 03/20/2021   Procedure: ICD IMPLANT;  Surgeon: Vickie Epley, MD;  Location: [place] [facility];  Service: Cardiovascular;  Laterality: N/A;   LEFT HEART CATH AND CORS/GRAFTS ANGIOGRAPHY N/A 10/04/2020   Procedure: LEFT HEART CATH AND CORS/GRAFTS ANGIOGRAPHY;  Surgeon: Leonie Man, MD;  Location: [place] [facility];  Service: Cardiovascular;  Laterality: N/A;   LUMBAR LAMINECTOMY/DECOMPRESSION MICRODISCECTOMY N/A 10/20/2018   Procedure: L3-4, L4-5 LUMBAR DECOMPRESSION with dural repair.;  Surgeon: Marybelle Killings, MD;  Location: [place];  Service: Orthopedics;  Laterality: N/A;   RIGHT/LEFT HEART CATH AND CORONARY/GRAFT ANGIOGRAPHY N/A 05/27/2022   Procedure: RIGHT/LEFT HEART CATH AND CORONARY/GRAFT ANGIOGRAPHY;  Surgeon: Nelva Bush, MD;  Location: [place] [facility];  Service: Cardiovascular;  Laterality: N/A;   TONSILLECTOMY AND ADENOIDECTOMY     TRANSURETHRAL RESECTION OF PROSTATE  1990    Current Medications: Current Meds  Medication Sig   acetaminophen (TYLENOL) 500 MG tablet Take 1,000 mg by mouth every 6 (six) hours as needed for moderate pain or headache.   aspirin EC 81 MG tablet Take 1 tablet (81 mg total) by mouth daily.   carvedilol (COREG) 12.5 MG tablet Take 0.5 tablets (6.25 mg total) by mouth 2 (two) times daily with a meal.   Cholecalciferol (VITAMIN D-3 PO) Take 800 Units by mouth daily.   clopidogrel (PLAVIX) 75 MG tablet TAKE 1 TABLET (75 MG TOTAL) BY MOUTH DAILY. **NO PLAVIX UNTIL 12/20**   Coenzyme Q10 (CO Q-10) 200 MG CAPS Take 200 mg by mouth daily.    cyanocobalamin 500 MCG tablet Take 500 mcg by mouth  daily.   diclofenac Sodium (VOLTAREN) 1 % GEL Apply 2 g topically 2 (two) times daily as needed.   ezetimibe (ZETIA) 10 MG tablet TAKE 1 TABLET BY MOUTH EVERY DAY   famotidine (PEPCID) 20 MG tablet One after supper   finasteride (PROSCAR) 5 MG tablet TAKE 1 TABLET BY MOUTH EVERY DAY   furosemide (LASIX) 40 MG tablet TAKE 1 TABLET BY MOUTH EVERY DAY   Glucosamine-Chondroit-Vit C-Mn (GLUCOSAMINE 1500 COMPLEX) CAPS Take 1 capsule by mouth daily.   isosorbide mononitrate (IMDUR) 30 MG 24 hr tablet Take 1 tablet (30 mg total) by mouth 2 (two) times daily.   Lancets (ONETOUCH ULTRASOFT) lancets Use to check blood sugars fasting daily   losartan (COZAAR) 25 MG tablet Take 0.5 tablets (12.5 mg total) by mouth daily.   Multiple Vitamins-Minerals (PRESERVISION AREDS 2 PO) Take 1 capsule by mouth 2 (two) times a day.  nitroGLYCERIN (NITROSTAT) 0.4 MG SL tablet PLACE 1 TABLET UNDER THE TONGUE EVERY 5 MINUTES AS NEEDED.   pantoprazole (PROTONIX) 40 MG tablet Take 1 tablet (40 mg total) by mouth daily. Take 30-60 min before first meal of the day   ranolazine (RANEXA) 1000 MG SR tablet TAKE 1 TABLET BY MOUTH TWICE A DAY   rosuvastatin (CRESTOR) 20 MG tablet TAKE 1 TABLET BY MOUTH EVERYDAY AT BEDTIME   terbinafine (LAMISIL) 250 MG tablet Take 1 tablet (250 mg total) by mouth daily.   triamcinolone cream (KENALOG) 0.1 % Apply 1 Application topically as directed. Qd up to 5 days per week to itchy rash on body until clear, avoid face, groin, axilla   [DISCONTINUED] empagliflozin (JARDIANCE) 10 MG TABS tablet TAKE 1 TABLET BY MOUTH DAILY BEFORE BREAKFAST.     Allergies:   Other and Tetracyclines & related   Social History   Socioeconomic History   Marital status: Married    Spouse name: Glenda   Number of children: 2   Years of education: Not on file   Highest education level: Not on file  Occupational History   Occupation: retired  Tobacco Use   Smoking status: Never    Passive exposure: Never    Smokeless tobacco: Never  Vaping Use   Vaping Use: Never used  Substance and Sexual Activity   Alcohol use: Not Currently    Comment: 1-2 drinks on occasion previously 5-7 drinks a week (was 10-15 drinks/week)   Drug use: No   Sexual activity: Not Currently    Birth control/protection: None  Other Topics Concern   Not on file  Social History Narrative   Not on file   Social Determinants of Health   Financial Resource Strain: Low Risk  (04/10/2021)   Overall Financial Resource Strain (CARDIA)    Difficulty of Paying Living Expenses: Not very hard  Food Insecurity: No Food Insecurity (04/10/2021)   Hunger Vital Sign    Worried About Running Out of Food in the Last Year: Never true    Ran Out of Food in the Last Year: Never true  Transportation Needs: No Transportation Needs (05/08/2022)   PRAPARE - Hydrologist (Medical): No    Lack of Transportation (Non-Medical): No  Physical Activity: Insufficiently Active (04/10/2021)   Exercise Vital Sign    Days of Exercise per Week: 3 days    Minutes of Exercise per Session: 20 min  Stress: No Stress Concern Present (04/10/2021)   [place]    Feeling of Stress : Not at all  Social Connections: Moderately Integrated (04/10/2021)   Social Connection and Isolation Panel [NHANES]    Frequency of Communication with Friends and Family: More than three times a week    Frequency of Social Gatherings with Friends and Family: Twice a week    Attends Religious Services: Never    Marine scientist or Organizations: Yes    Attends Music therapist: More than 4 times per year    Marital Status: Married     Family History: The patient's family history includes Alcohol abuse in his maternal uncle; Cancer in his father and paternal uncle; Diabetes in his maternal uncle and mother; Hyperlipidemia in his maternal uncle and mother; Skin cancer in his  brother.  ROS:   Please see the history of present illness.     All other systems reviewed and are negative.  EKGs/Labs/Other Studies Reviewed:  The following studies were reviewed today:  Cardiac cath 05/2022 Conclusions: Severe multivessel coronary artery disease, including chronic total occlusions of mid LAD and proximal RCA.  There is moderate LMCA and LCx disease.  Overall appearance is similar to prior catheterization in 2020. Known occlusions of all bypass grafts; occlusions of SVG-LAD and SVG-RCA redemonstrated today. Normal left heart and right heart filling pressures. Normal Fick cardiac output/index.   Recommendations: Decease carvedilol to 6.25 mg daily given borderline low BP, bradycardia, and fatigue. Repeat CBC with diff to reassess leukocytosis. Consider CT chest for further evaluation of left lung base opacity on recent CXR and dyspnea. Aggressive secondary prevention of coronary artery disease.   Nelva Bush, MD [facility]  Echo 04/2022  1. Left ventricular ejection fraction, by estimation, is 30 to 35%. The  left ventricle has moderate to severely decreased function. The left  ventricle demonstrates global hypokinesis. There is mild left ventricular  hypertrophy of the basal-septal  segment. Left ventricular diastolic parameters are indeterminate.   2. Right ventricular systolic function is low normal. The right  ventricular size is mildly enlarged.   3. Left atrial size was mildly dilated.   4. The mitral valve is normal in structure. Mild mitral valve  regurgitation.   5. The aortic valve is tricuspid. Aortic valve regurgitation is not  visualized. Mild aortic valve stenosis. Aortic valve mean gradient  measures 11.0 mmHg.   6. Aortic dilatation noted. There is mild dilatation of the ascending  aorta, measuring 39 mm.   7. The inferior vena cava is normal in size with greater than 50%  respiratory variability, suggesting right atrial pressure of  3 mmHg.   Comparison(s): Limited TTE (02/20/2021): LVEF 30-35%. Mild mitral  regurgitation. Aortic sclerosis without stenosis.  EKG:  EKG is ordered today.  The ekg ordered today demonstrates NSR 67bpm, nonspecific T wave changes  Recent Labs: 02/17/2022: ALT 23 05/22/2022: B Natriuretic Peptide 107.1; BUN 20; Creatinine, Ser 0.96 05/27/2022: Potassium 3.7; Sodium 136 06/13/2022: Hemoglobin 14.0; Platelets 306; TSH 2.734  Recent Lipid Panel    Component Value Date/Time   CHOL 139 02/17/2022 1018   TRIG 91 02/17/2022 1018   HDL 73 02/17/2022 1018   CHOLHDL 1.9 02/17/2022 1018   CHOLHDL 2.7 11/14/2016 0911   VLDL 27 11/14/2016 0911   LDLCALC 49 02/17/2022 1018       Physical Exam:    VS:  BP 106/60 (BP Location: Left Arm, Patient Position: Sitting, Cuff Size: Normal)   Pulse 67   Ht 5\' 7"  (1.702 m)   Wt 162 lb 3.2 oz (73.6 kg)   SpO2 96%   BMI 25.40 kg/m     Wt Readings from Last 3 Encounters:  06/23/22 162 lb 3.2 oz (73.6 kg)  06/13/22 163 lb 3.2 oz (74 kg)  05/27/22 162 lb (73.5 kg)     GEN:  Well nourished, well developed in no acute distress HEENT: Normal NECK: No JVD; No carotid bruits LYMPHATICS: No lymphadenopathy CARDIAC: RRR, no murmurs, rubs, gallops RESPIRATORY:  Clear to auscultation without rales, wheezing or rhonchi  ABDOMEN: Soft, non-tender, non-distended MUSCULOSKELETAL:   edema; No deformity  SKIN: Warm and dry NEUROLOGIC:  Alert and oriented x 3 PSYCHIATRIC:  Normal affect   ASSESSMENT:    1. Coronary artery disease of native artery of native heart with stable angina pectoris ([place])   2. Chronic systolic heart failure ([place])   3. Ischemic cardiomyopathy   4. Hyperlipidemia associated with type 2 diabetes mellitus ([place])  PLAN:    In order of problems listed above:  CAD with stable angina Recent left heart cath showed severe multivessel CAD, CTO of the mid LAD and proximal RCA, moderate LMCA and left circumflex disease, overall similar  appearance to prior cath in 2020, known occlusions of all bypass grafts, normal left heart and right heart filling pressures.  Coreg was decreased due to borderline low BP, bradycardia, and fatigue.  Right radial cath site is stable.  Patient reports improved angina since the heart cath.  Patient is able to walk more and spend more time on the recumbent bike.  He is only requiring 3-4 SL NTG in the month.  No further ischemic workup indicated at this time.  Continue aspirin 81 mg daily, Plavix 75 mg daily, Coreg 6.25 mg daily, Imdur 30 mg twice daily, Zetia 10 mg daily, Crestor 20 mg daily, Ranexa 1000 mg twice a day.  Chronic HFrEF ICM EF 30 to 35% The patient appears euvolemic on exam today.  Patient reports improved dyspnea on exertion as above. He has dependent left lower leg edema. Patient takes Lasix 40 mg daily.  Continue Coreg 6.25 mg daily, Jardiance 10 mg daily, and losartan 1.5 mg daily.  Unable to titrate GDT a.m. due to soft blood pressures.  HLD LDL 49. Continue Crestor 20mg  daily.   Disposition: Follow up in 4 month(s) with MD/APP    Signed, Jatin Naumann Ninfa Meeker, PA-C  06/23/2022 12:48 PM    [facility]

## 2022-06-23 NOTE — Telephone Encounter (Signed)
Will you refill this if appropriate during today's office visit with Cadence Furth please? Thank you!

## 2022-06-27 ENCOUNTER — Encounter: Payer: Self-pay | Admitting: Orthopaedic Surgery

## 2022-06-27 ENCOUNTER — Other Ambulatory Visit (INDEPENDENT_AMBULATORY_CARE_PROVIDER_SITE_OTHER): Payer: Medicare HMO

## 2022-06-27 ENCOUNTER — Ambulatory Visit (INDEPENDENT_AMBULATORY_CARE_PROVIDER_SITE_OTHER): Payer: Medicare HMO | Admitting: Orthopaedic Surgery

## 2022-06-27 VITALS — BP 105/72 | HR 67 | Ht 67.0 in | Wt 161.0 lb

## 2022-06-27 DIAGNOSIS — M545 Low back pain, unspecified: Secondary | ICD-10-CM

## 2022-06-27 NOTE — Progress Notes (Signed)
Office Visit Note   Patient: Jacob Harrison           Date of Birth: 08/01/1940           MRN: [id number] Visit Date: 06/27/2022              Requested by: Velva Harman, PA [place],  [place] [id number] PCP: Velva Harman, PA   Assessment & Plan: Visit Diagnoses:  1. Acute bilateral low back pain, unspecified whether sciatica present     Plan: Patient can continue topical cream, ice, heat, occasional Tylenol.  He could look into inversion table.  We discussed with his curvature that surgery options would be a multilevel procedure and he currently has a pacemaker defibrillator and this has cardiomyopathy and not a candidate for such extensive surgery.  Follow-Up Instructions: No follow-ups on file.   Orders:  Orders Placed This Encounter  Procedures   XR Lumbar Spine 2-3 Views   No orders of the defined types were placed in this encounter.     Procedures: No procedures performed   Clinical Data: No additional findings.   Subjective: Chief Complaint  Patient presents with   Lower Back - Pain    HPI 82 year old male returns post two-level decompression L3-4 L4-5 in 2020.  He said chronic back pain he has cardiac problems had bypass surgery 2001 and 2012.  He has ischemic cardiomyopathy.  No current chest pain.  He is not allowed to take anti-inflammatories uses plain Tylenol.  Has back discomfort when he walks for a while his right leg bothers him he has to stop sit rest and then pain resolves.  No associated bowel or bladder symptoms.  Uses ice and heat which helps some.  Review of Systems all systems noncontributory to HPI.   Objective: Vital Signs: BP 105/72   Pulse 67   Ht 5\' 7"  (1.702 m)   Wt 161 lb (73 kg)   BMI 25.22 kg/m   Physical Exam Constitutional:      Appearance: He is well-developed.  HENT:     Head: Normocephalic and atraumatic.     Right Ear: External ear normal.     Left Ear: External ear normal.   Eyes:     Pupils: Pupils are equal, round, and reactive to light.  Neck:     Thyroid: No thyromegaly.     Trachea: No tracheal deviation.  Cardiovascular:     Rate and Rhythm: Normal rate.  Pulmonary:     Effort: Pulmonary effort is normal.     Breath sounds: No wheezing.  Abdominal:     General: Bowel sounds are normal.     Palpations: Abdomen is soft.  Musculoskeletal:     Cervical back: Neck supple.  Skin:    General: Skin is warm and dry.     Capillary Refill: Capillary refill takes less than 2 seconds.  Neurological:     Mental Status: He is alert and oriented to person, place, and time.  Psychiatric:        Behavior: Behavior normal.        Thought Content: Thought content normal.        Judgment: Judgment normal.     Ortho Exam patient ambulates with a cane.  Negative logroll to hips.  Normal heel-toe gait with cane.  Specialty Comments:  No specialty comments available.  Imaging: No results found.   PMFS History: Patient Active Problem List   Diagnosis Date Noted  Dyspnea on exertion 05/22/2022   Chronic HFrEF (heart failure with reduced ejection fraction) ([place]) 12/13/2021   Acute on chronic heart failure with preserved ejection fraction (HFpEF) ([place]) 10/08/2020   Non-ST elevation (NSTEMI) myocardial infarction ([place]) 10/08/2020   Hypokalemia due to loss of potassium 10/08/2020   Coronary artery disease of native heart with stable angina pectoris ([place]) 10/04/2020   CHF (congestive heart failure) ([place]) 10/04/2020   Hx of CABG    Accelerating angina ([place]) 10/03/2020   Chest pain 09/05/2020   Loss of balance 06/14/2020   Rotator cuff disorder 08/09/2019   BPH associated with nocturia 11/29/2018   Status post lumbar spine surgery for decompression of spinal cord 10/29/2018   Encounter for Medicare annual wellness exam 08/17/2018   Spinal stenosis of lumbar region 08/02/2018   Flu-like symptoms 05/05/2018   Bacterial conjunctivitis of both eyes 04/27/2018    Atypical mole 07/30/2017   Healthcare maintenance 07/30/2017   Wheezing 07/21/2017   Neck pain 06/08/2017   Sinus bradycardia 03/18/2017   Screening for colon cancer 02/25/2017   Ventral hernia without obstruction or gangrene 02/25/2017   (HFpEF) heart failure with preserved ejection fraction ([place]) 09/28/2016   Ischemic cardiomyopathy 09/28/2016   Cough in adult 07/22/2016   Vitamin D deficiency 06/27/2016   Vitamin B deficiency 06/27/2016   Mixed hyperlipidemia 06/21/2016   Weakness of both lower extremities 06/21/2016   Type 2 diabetes mellitus without complication, without long-term current use of insulin ([place]) 05/20/2016   Essential hypertension 05/20/2016   Hyperlipidemia LDL goal <70 05/20/2016   Coronary artery disease of native artery of native heart with stable angina pectoris ([place]) 05/20/2016   Past Medical History:  Diagnosis Date   (HFpEF) heart failure with preserved ejection fraction ([place])    a. 10/2016 Echo: EF 65-70%, mild LVH, mildly dil LA. RV fxn nl.   Arthritis    BPH (benign prostatic hyperplasia)    Coronary artery disease    a. 2001 CABG x 3 [facility]): LIMA->LAD, VG->LCX, VG->RPDA; b. 04/05/2010 Cath ([place]): LM 13m, LAD 177m, LCX 78m, RCA 100p, LIMA->LAD atretic, VG->LCX 100, VG->RCA 80ost, 70d; c. 04/2010 Redo CABG x 2 ([place]): VG->LAD, VG->RPDA; d. 10/2012 Cath [facility]): LM 20-30, LAD 100/95-101m, LCX 40-29m, RCA 100p, 80-100d, RPDA 99, RPL1 95, VG->LAD ok, VG->RPDA ok, EF 67%; e. 11/2016 MV: EF 48%, No ischemia.   Diabetes mellitus without complication ([place])    Type II   Hyperlipidemia    Hypertension    Ischemic cardiomyopathy    a. 03/2021 s/p MDT [facility] AF MRI VR SureScan single lead AICD (ser# [id number]).   Scoliosis    Spinal stenosis     Family History  Problem Relation Age of Onset   Diabetes Mother    Hyperlipidemia Mother    Cancer Father        colon   Skin cancer Brother    Alcohol abuse Maternal Uncle    Diabetes Maternal Uncle     Hyperlipidemia Maternal Uncle    Cancer Paternal Uncle        colon    Past Surgical History:  Procedure Laterality Date   CARDIAC CATHETERIZATION     CORONARY ARTERY BYPASS GRAFT     x 2 (2001 and redo in late 2011 or early 2012)   HERNIA REPAIR Left    inguinal   ICD IMPLANT N/A 03/20/2021   Procedure: ICD IMPLANT;  Surgeon: Vickie Epley, MD;  Location: [facility];  Service: Cardiovascular;  Laterality: N/A;  LEFT HEART CATH AND CORS/GRAFTS ANGIOGRAPHY N/A 10/04/2020   Procedure: LEFT HEART CATH AND CORS/GRAFTS ANGIOGRAPHY;  Surgeon: Leonie Man, MD;  Location: [facility];  Service: Cardiovascular;  Laterality: N/A;   LUMBAR LAMINECTOMY/DECOMPRESSION MICRODISCECTOMY N/A 10/20/2018   Procedure: L3-4, L4-5 LUMBAR DECOMPRESSION with dural repair.;  Surgeon: Marybelle Killings, MD;  Location: [facility];  Service: Orthopedics;  Laterality: N/A;   RIGHT/LEFT HEART CATH AND CORONARY/GRAFT ANGIOGRAPHY N/A 05/27/2022   Procedure: RIGHT/LEFT HEART CATH AND CORONARY/GRAFT ANGIOGRAPHY;  Surgeon: Nelva Bush, MD;  Location: [facility];  Service: Cardiovascular;  Laterality: N/A;   TONSILLECTOMY AND ADENOIDECTOMY     TRANSURETHRAL RESECTION OF PROSTATE  1990   Social History   Occupational History   Occupation: retired  Tobacco Use   Smoking status: Never    Passive exposure: Never   Smokeless tobacco: Never  Vaping Use   Vaping Use: Never used  Substance and Sexual Activity   Alcohol use: Not Currently    Comment: 1-2 drinks on occasion previously 5-7 drinks a week (was 10-15 drinks/week)   Drug use: No   Sexual activity: Not Currently    Birth control/protection: None

## 2022-06-29 ENCOUNTER — Encounter: Payer: Self-pay | Admitting: Internal Medicine

## 2022-07-02 ENCOUNTER — Ambulatory Visit (INDEPENDENT_AMBULATORY_CARE_PROVIDER_SITE_OTHER): Payer: Medicare HMO | Admitting: Family Medicine

## 2022-07-02 ENCOUNTER — Telehealth: Payer: Self-pay | Admitting: Physical Medicine and Rehabilitation

## 2022-07-02 ENCOUNTER — Encounter: Payer: Self-pay | Admitting: Family Medicine

## 2022-07-02 VITALS — BP 103/66 | HR 71 | Ht 67.0 in | Wt 168.0 lb

## 2022-07-02 DIAGNOSIS — M545 Low back pain, unspecified: Secondary | ICD-10-CM

## 2022-07-02 DIAGNOSIS — G8929 Other chronic pain: Secondary | ICD-10-CM | POA: Diagnosis not present

## 2022-07-02 DIAGNOSIS — M79605 Pain in left leg: Secondary | ICD-10-CM | POA: Diagnosis not present

## 2022-07-02 DIAGNOSIS — G729 Myopathy, unspecified: Secondary | ICD-10-CM

## 2022-07-02 DIAGNOSIS — M79604 Pain in right leg: Secondary | ICD-10-CM

## 2022-07-02 MED ORDER — DULOXETINE HCL 30 MG PO CPEP: 60.0000 mg | ORAL_CAPSULE | Freq: Every day | ORAL | 0 refills | Status: DC

## 2022-07-02 NOTE — Assessment & Plan Note (Signed)
Most likely his bilateral thigh pain is related to his lumbar disorder.  X-ray shows scoliosis.  There is the possibility that he is having statin induced myopathy.  Has had this in the past when on atorvastatin, resolved when he started on rosuvastatin.  Given his significant cardiac history, he was advised to discuss with his cardiologist regarding switching statin medications if other treatment modalities fail.  Will get CK to rule out myositis. - Continue current treatment of Tylenol, topicals, heat/ice - Advised to reach out to his [facility] for scheduling an appointment.  Can place referral if needed. - Starting duloxetine for musculoskeletal/chronic low back pain.  30 mg daily x 7 days then 60 mg. - Not a good candidate for NSAIDs or oral steroids due to his heart disease.

## 2022-07-02 NOTE — Patient Instructions (Signed)
I have added duloxetine for you to take at night.    Take 1 tablet nightly for the first 7 days, then take 2 tablets every night after that.    You should reach out to schedule an appointment with your PM&R doctor, Dr. Ernestina Patches.    You can use over the counter pain relieving creams as needed.   You should discuss with your cardiologist the possibility of switching statins if the pain does not improve with other treatments.    Have a great day,   Dr. Jeannine Kitten

## 2022-07-02 NOTE — Telephone Encounter (Signed)
Patient called needing to schedule an appointment with Dr. Ernestina Patches for his lower back. The number to contact patient is [phone]

## 2022-07-02 NOTE — Telephone Encounter (Signed)
Please advise 

## 2022-07-02 NOTE — Progress Notes (Signed)
Acute Office Visit  Subjective:     Patient ID: Jacob Harrison, male    DOB: 07/14/1940, 81 y.o.   MRN: [id number]  Chief Complaint  Patient presents with   Leg Pain   Back Pain    HPI Patient presents today for follow-up of leg and back pain.  States leg pain is worse than the back pain at this point.  Describes the pain is mostly in the posterior thigh but if he tries to flex his hips or extend his knee he gets pain in the quadriceps.  Has difficulty ambulating due to the pain.  He saw his orthopedist on Friday.  They took x-rays of the lumbar spine and said that his spinal curvature was worse but that he was not a candidate for surgery at this point and there is nothing they saw on imaging that was new from previous.  Patient states this worsening of his pain has been going on for the past 1 to 2 weeks.  New medications in the past 3 weeks include Lamisil tablet and Pepcid and Protonix.  He previously had mild myopathic pain with high dose of atorvastatin before he was switched to rosuvastatin.  Has been on rosuvastatin for multiple years now.  Patient also has spoken with his cardiologist who recommended against any sort of NSAID due to his heart history.  Per the patient the cardiologist was okay with Voltaren gel.  Patient is currently using Tylenol 500 to 1000 mg at a time with a max dose of 2 to 3000 mg/day.  Also has lidocaine patches and Voltaren gel he puts on his back.  Has not put any of this on his thighs yet.  He also uses heat and ice on his back.  Patient previously saw a physical medicine rehabilitation doctor, Dr. Ernestina Patches prior to his laminectomy.    ROS      Objective:    BP 103/66   Pulse 71   Ht 5\' 7"  (1.702 m)   Wt 168 lb (76.2 kg)   SpO2 98% Comment: on RA  BMI 26.31 kg/m    Physical Exam General: Alert and oriented. CV: Regular rate and rhythm Pulmonary: No respiratory distress MSK: No tenderness to palpation of the thighs.  Slowed rise from  sitting position, slowed ambulation.  Lumbar pain with seated leg extension  No results found for any visits on 07/02/22.      Assessment & Plan:   Problem List Items Addressed This Visit       Other   Chronic low back pain   Relevant Medications   DULoxetine (CYMBALTA) 30 MG capsule   Pain in both lower extremities    Most likely his bilateral thigh pain is related to his lumbar disorder.  X-ray shows scoliosis.  There is the possibility that he is having statin induced myopathy.  Has had this in the past when on atorvastatin, resolved when he started on rosuvastatin.  Given his significant cardiac history, he was advised to discuss with his cardiologist regarding switching statin medications if other treatment modalities fail.  Will get CK to rule out myositis. - Continue current treatment of Tylenol, topicals, heat/ice - Advised to reach out to his [place] [facility] for scheduling an appointment.  Can place referral if needed. - Starting duloxetine for musculoskeletal/chronic low back pain.  30 mg daily x 7 days then 60 mg. - Not a good candidate for NSAIDs or oral steroids due to his heart disease.  Other Visit Diagnoses     Myopathy    -  Primary   Relevant Orders   CK       Meds ordered this encounter  Medications   DULoxetine (CYMBALTA) 30 MG capsule    Sig: Take 2 capsules (60 mg total) by mouth at bedtime. Take 1 capsule at night for 7 days, then increase to 2 capsules nightly.    Dispense:  60 capsule    Refill:  0    Return if symptoms worsen or fail to improve.  Benay Pike, MD

## 2022-07-03 NOTE — Telephone Encounter (Signed)
Referral entered  

## 2022-07-04 ENCOUNTER — Ambulatory Visit
Admission: EM | Admit: 2022-07-04 | Discharge: 2022-07-04 | Disposition: A | Discharge status: Home / Self Care | Payer: Medicare HMO | Attending: Family Medicine | Admitting: Family Medicine

## 2022-07-04 DIAGNOSIS — M5416 Radiculopathy, lumbar region: Secondary | ICD-10-CM

## 2022-07-04 LAB — CK: Total CK: 5872 U/L (ref 41–331)

## 2022-07-04 MED ORDER — TRAMADOL HCL 50 MG PO TABS: 50.0000 mg | ORAL_TABLET | Freq: Three times a day (TID) | ORAL | 0 refills | Status: DC | PRN

## 2022-07-04 NOTE — ED Triage Notes (Signed)
Pt reports shooting lower back pain and thighs. Saw ortho 03/22 and told he is not a surgical candidate. Saw PCP 03/27 and was given medication. Started meds two days ago and is supposed to increase dose next week. Pt reports increase in pain and difficulty walking. Is afraid he will fall. Pain better with sitting still but very bad with movement. Tried to call PCP today but they are closed.

## 2022-07-04 NOTE — Discharge Instructions (Signed)
Take tramadol 50 mg--  you can take 1 tablet as often as every 8 hours as needed for pain.  This medication can make you sleepy or dizzy.  I would start by trying to take 1 every 12 hours as needed to see if that is enough.  Tramadol can interact with the duloxetine that you have just been started on

## 2022-07-04 NOTE — ED Provider Notes (Signed)
[facility]    CSN: [id number] Arrival date & time: 07/04/22  1025      History   Chief Complaint Chief Complaint  Patient presents with   Back Pain   Leg Pain    HPI Jacob Harrison is a 82 y.o. male.    Back Pain Associated symptoms: leg pain   Leg Pain Associated symptoms: back pain    Here for low back pain with lumbar radiculopathy.  He has had a lumbar left meniscectomy about 4 years ago and that did help for the pain he was having then.  In the last 2 weeks he has begun to have low back pain that is radiating into his thighs and lower legs.  He has seen his spine surgeon does not feel that he is a surgical candidate at this time.  He has spoken with his cardiologist and I can see those notes.  They recommended against his taking any NSAIDs or steroids.  He is then seen his primary care earlier this week and was begun on duloxetine, and he is to increase the dose and a week from when he started.  He is not seeing any results from the duloxetine so far.  He is taking Tylenol 500 mg, up to 4 times a day.  That is helping a little bit.  He did try to call his primary care this morning, but they are closed for holiday hours.  He has taken tramadol without trouble in the past.  There is nothing on PMP when I checked today.   Past Medical History:  Diagnosis Date   (HFpEF) heart failure with preserved ejection fraction ([place])    a. 10/2016 Echo: EF 65-70%, mild LVH, mildly dil LA. RV fxn nl.   Arthritis    BPH (benign prostatic hyperplasia)    Coronary artery disease    a. 2001 CABG x 3 [facility]): LIMA->LAD, VG->LCX, VG->RPDA; b. 04/05/2010 Cath ([place]): LM 78m, LAD 13m, LCX 63m, RCA 100p, LIMA->LAD atretic, VG->LCX 100, VG->RCA 80ost, 70d; c. 04/2010 Redo CABG x 2 ([place]): VG->LAD, VG->RPDA; d. 10/2012 Cath [facility]arlene [facility]): LM 20-30, LAD 100/95-91m, LCX 40-50m, RCA 100p, 80-100d, RPDA 99, RPL1 95, VG->LAD ok, VG->RPDA ok, EF 67%; e. 11/2016 MV: EF 48%, No  ischemia.   Diabetes mellitus without complication ([place])    Type II   Hyperlipidemia    Hypertension    Ischemic cardiomyopathy    a. 03/2021 s/p MDT Visia AF MRI VR SureScan single lead AICD (ser# [id number]).   Scoliosis    Spinal stenosis     Patient Active Problem List   Diagnosis Date Noted   Chronic low back pain 07/02/2022   Pain in both lower extremities 07/02/2022   Dyspnea on exertion 05/22/2022   Chronic HFrEF (heart failure with reduced ejection fraction) ([place]) 12/13/2021   Acute on chronic heart failure with preserved ejection fraction (HFpEF) ([place]) 10/08/2020   Non-ST elevation (NSTEMI) myocardial infarction ([place]) 10/08/2020   Hypokalemia due to loss of potassium 10/08/2020   Coronary artery disease of native heart with stable angina pectoris ([place]) 10/04/2020   CHF (congestive heart failure) ([place]) 10/04/2020   Hx of CABG    Accelerating angina ([place]) 10/03/2020   Chest pain 09/05/2020   Loss of balance 06/14/2020   Rotator cuff disorder 08/09/2019   BPH associated with nocturia 11/29/2018   Status post lumbar spine surgery for decompression of spinal cord 10/29/2018   Encounter for Medicare annual wellness exam 08/17/2018   Spinal stenosis of  lumbar region 08/02/2018   Flu-like symptoms 05/05/2018   Bacterial conjunctivitis of both eyes 04/27/2018   Atypical mole 07/30/2017   Healthcare maintenance 07/30/2017   Wheezing 07/21/2017   Neck pain 06/08/2017   Sinus bradycardia 03/18/2017   Screening for colon cancer 02/25/2017   Ventral hernia without obstruction or gangrene 02/25/2017   (HFpEF) heart failure with preserved ejection fraction ([place]) 09/28/2016   Ischemic cardiomyopathy 09/28/2016   Cough in adult 07/22/2016   Vitamin D deficiency 06/27/2016   Vitamin B deficiency 06/27/2016   Mixed hyperlipidemia 06/21/2016   Weakness of both lower extremities 06/21/2016   Type 2 diabetes mellitus without complication, without long-term current use of insulin  ([place]) 05/20/2016   Essential hypertension 05/20/2016   Hyperlipidemia LDL goal <70 05/20/2016   Coronary artery disease of native artery of native heart with stable angina pectoris ([place]) 05/20/2016    Past Surgical History:  Procedure Laterality Date   CARDIAC CATHETERIZATION     CORONARY ARTERY BYPASS GRAFT     x 2 (2001 and redo in late 2011 or early 2012)   HERNIA REPAIR Left    inguinal   ICD IMPLANT N/A 03/20/2021   Procedure: ICD IMPLANT;  Surgeon: Vickie Epley, MD;  Location: [facility];  Service: Cardiovascular;  Laterality: N/A;   LEFT HEART CATH AND CORS/GRAFTS ANGIOGRAPHY N/A 10/04/2020   Procedure: LEFT HEART CATH AND CORS/GRAFTS ANGIOGRAPHY;  Surgeon: Leonie Man, MD;  Location: [facility];  Service: Cardiovascular;  Laterality: N/A;   LUMBAR LAMINECTOMY/DECOMPRESSION MICRODISCECTOMY N/A 10/20/2018   Procedure: L3-4, L4-5 LUMBAR DECOMPRESSION with dural repair.;  Surgeon: Marybelle Killings, MD;  Location: [facility];  Service: Orthopedics;  Laterality: N/A;   RIGHT/LEFT HEART CATH AND CORONARY/GRAFT ANGIOGRAPHY N/A 05/27/2022   Procedure: RIGHT/LEFT HEART CATH AND CORONARY/GRAFT ANGIOGRAPHY;  Surgeon: Nelva Bush, MD;  Location: [facility];  Service: Cardiovascular;  Laterality: N/A;   TONSILLECTOMY AND ADENOIDECTOMY     TRANSURETHRAL RESECTION OF PROSTATE  1990       Home Medications    Prior to Admission medications   Medication Sig Start Date End Date Taking? Authorizing Provider  traMADol (ULTRAM) 50 MG tablet Take 1 tablet (50 mg total) by mouth every 8 (eight) hours as needed (pain). 07/04/22  Yes Barrett Henle, MD  acetaminophen (TYLENOL) 500 MG tablet Take 1,000 mg by mouth every 6 (six) hours as needed for moderate pain or headache.    [provider]  aspirin EC 81 MG tablet Take 1 tablet (81 mg total) by mouth daily. 09/26/16   End, Harrell Gave, MD  carvedilol (COREG) 12.5 MG tablet Take 0.5 tablets (6.25 mg total)  by mouth 2 (two) times daily with a meal. 05/27/22   End, Harrell Gave, MD  Cholecalciferol (VITAMIN D-3 PO) Take 800 Units by mouth daily.    [provider]  clopidogrel (PLAVIX) 75 MG tablet TAKE 1 TABLET (75 MG TOTAL) BY MOUTH DAILY. **NO PLAVIX UNTIL 12/20** 04/21/22   Furth, Cadence H, PA-C  Coenzyme Q10 (CO Q-10) 200 MG CAPS Take 200 mg by mouth daily.     [provider]  cyanocobalamin 500 MCG tablet Take 500 mcg by mouth daily.    [provider]  diclofenac Sodium (VOLTAREN) 1 % GEL Apply 2 g topically 2 (two) times daily as needed. 02/22/19   Danford, Valetta Fuller D, NP  DULoxetine (CYMBALTA) 30 MG capsule Take 2 capsules (60 mg total) by mouth at bedtime. Take 1 capsule  at night for 7 days, then increase to 2 capsules nightly. 07/02/22 08/01/22  Benay Pike, MD  empagliflozin (JARDIANCE) 10 MG TABS tablet Take 1 tablet (10 mg total) by mouth daily before breakfast. 06/23/22   Furth, Cadence H, PA-C  ezetimibe (ZETIA) 10 MG tablet TAKE 1 TABLET BY MOUTH EVERY DAY 01/22/22   [name], PA-C  famotidine (PEPCID) 20 MG tablet One after supper 06/13/22   Tanda Rockers, MD  finasteride (PROSCAR) 5 MG tablet TAKE 1 TABLET BY MOUTH EVERY DAY 02/19/22   Abonza, Maritza, PA-C  furosemide (LASIX) 40 MG tablet TAKE 1 TABLET BY MOUTH EVERY DAY 05/15/22   End, Harrell Gave, MD  Glucosamine-Chondroit-Vit C-Mn (GLUCOSAMINE 1500 COMPLEX) CAPS Take 1 capsule by mouth daily.    [provider]  isosorbide mononitrate (IMDUR) 30 MG 24 hr tablet Take 1 tablet (30 mg total) by mouth 2 (two) times daily. 04/11/22   End, Harrell Gave, MD  Lancets [facility] ULTRASOFT) lancets Use to check blood sugars fasting daily 02/03/18   Danford, Valetta Fuller D, NP  losartan (COZAAR) 25 MG tablet Take 0.5 tablets (12.5 mg total) by mouth daily. 09/04/21   Furth, Cadence H, PA-C  Multiple Vitamins-Minerals (PRESERVISION AREDS 2 PO) Take 1 capsule by mouth 2 (two) times a day.    [provider]   nitroGLYCERIN (NITROSTAT) 0.4 MG SL tablet PLACE 1 TABLET UNDER THE TONGUE EVERY 5 MINUTES AS NEEDED. 04/14/22   End, Harrell Gave, MD  pantoprazole (PROTONIX) 40 MG tablet Take 1 tablet (40 mg total) by mouth daily. Take 30-60 min before first meal of the day 06/13/22   Tanda Rockers, MD  ranolazine (RANEXA) 1000 MG SR tablet TAKE 1 TABLET BY MOUTH TWICE A DAY 03/24/22   End, Harrell Gave, MD  rosuvastatin (CRESTOR) 20 MG tablet TAKE 1 TABLET BY MOUTH EVERYDAY AT BEDTIME 01/03/22   Abonza, Maritza, PA-C  terbinafine (LAMISIL) 250 MG tablet Take 1 tablet (250 mg total) by mouth daily. 06/19/22   Ralene Bathe, MD  triamcinolone cream (KENALOG) 0.1 % Apply 1 Application topically as directed. Qd up to 5 days per week to itchy rash on body until clear, avoid face, groin, axilla 06/19/22   Ralene Bathe, MD    Family History Family History  Problem Relation Age of Onset   Diabetes Mother    Hyperlipidemia Mother    Cancer Father        colon   Skin cancer Brother    Alcohol abuse Maternal Uncle    Diabetes Maternal Uncle    Hyperlipidemia Maternal Uncle    Cancer Paternal Uncle        colon    Social History Social History   Tobacco Use   Smoking status: Never    Passive exposure: Never   Smokeless tobacco: Never  Vaping Use   Vaping Use: Never used  Substance Use Topics   Alcohol use: Not Currently    Comment: 1-2 drinks on occasion previously 5-7 drinks a week (was 10-15 drinks/week)   Drug use: No     Allergies   Other and Tetracyclines & related   Review of Systems Review of Systems  Musculoskeletal:  Positive for back pain.     Physical Exam Triage Vital Signs ED Triage Vitals  Enc Vitals Group     BP 07/04/22 1052 118/79     Pulse Rate 07/04/22 1052 62     Resp 07/04/22 1052 18     Temp 07/04/22 1052 98 F (36.7  C)     Temp Source 07/04/22 1052 Oral     SpO2 07/04/22 1052 97 %     Weight --      Height --      Head Circumference --      Peak Flow  --      Pain Score 07/04/22 1048 2     Pain Loc --      Pain Edu? --      Excl. in [place]? --    No data found.  Updated Vital Signs BP 118/79 (BP Location: Left Arm)   Pulse 62   Temp 98 F (36.7 C) (Oral)   Resp 18   SpO2 97%   Visual Acuity Right Eye Distance:   Left Eye Distance:   Bilateral Distance:    Right Eye Near:   Left Eye Near:    Bilateral Near:     Physical Exam Vitals reviewed.  Constitutional:      General: He is not in acute distress.    Appearance: He is not toxic-appearing.  Musculoskeletal:        General: Tenderness present.  Skin:    Coloration: Skin is not jaundiced or pale.  Neurological:     Mental Status: He is alert and oriented to person, place, and time.  Psychiatric:        Behavior: Behavior normal.      UC Treatments / Results  Labs (all labs ordered are listed, but only abnormal results are displayed) Labs Reviewed - No data to display  EKG   Radiology No results found.  Procedures Procedures (including critical care time)  Medications Ordered in UC Medications - No data to display  Initial Impression / Assessment and Plan / UC Course  I have reviewed the triage vital signs and the nursing notes.  Pertinent labs & imaging results that were available during my care of the patient were reviewed by me and considered in my medical decision making (see chart for details).        Small supply of tramadol was sent in today.  He will follow-up with his primary care about whether or not he should continue that prescription. Since there is a potential for tramadol to interact with Cymbalta, I am going to dose it for every 8 hours as needed. Final Clinical Impressions(s) / UC Diagnoses   Final diagnoses:  Lumbar radiculopathy     Discharge Instructions      Take tramadol 50 mg--  you can take 1 tablet as often as every 8 hours as needed for pain.  This medication can make you sleepy or dizzy.  I would start by  trying to take 1 every 12 hours as needed to see if that is enough.  Tramadol can interact with the duloxetine that you have just been started on      ED Prescriptions     Medication Sig Dispense Auth. Provider   traMADol (ULTRAM) 50 MG tablet Take 1 tablet (50 mg total) by mouth every 8 (eight) hours as needed (pain). 10 tablet Windy Carina Gwenlyn Perking, MD      I have reviewed the PDMP during this encounter.   Barrett Henle, MD 07/04/22 1130

## 2022-07-05 ENCOUNTER — Other Ambulatory Visit: Payer: Self-pay

## 2022-07-05 ENCOUNTER — Inpatient Hospital Stay
Admission: EM | Admit: 2022-07-05 | Discharge: 2022-07-10 | DRG: 558 | Disposition: A | Discharge status: Home / Self Care | Payer: Medicare HMO | Attending: Internal Medicine | Admitting: Internal Medicine

## 2022-07-05 ENCOUNTER — Encounter: Payer: Self-pay | Admitting: Emergency Medicine

## 2022-07-05 DIAGNOSIS — I110 Hypertensive heart disease with heart failure: Secondary | ICD-10-CM | POA: Diagnosis present

## 2022-07-05 DIAGNOSIS — Z951 Presence of aortocoronary bypass graft: Secondary | ICD-10-CM

## 2022-07-05 DIAGNOSIS — D72829 Elevated white blood cell count, unspecified: Secondary | ICD-10-CM | POA: Insufficient documentation

## 2022-07-05 DIAGNOSIS — Z95810 Presence of automatic (implantable) cardiac defibrillator: Secondary | ICD-10-CM | POA: Diagnosis not present

## 2022-07-05 DIAGNOSIS — M6282 Rhabdomyolysis: Secondary | ICD-10-CM | POA: Diagnosis not present

## 2022-07-05 DIAGNOSIS — T490X5A Adverse effect of local antifungal, anti-infective and anti-inflammatory drugs, initial encounter: Secondary | ICD-10-CM | POA: Diagnosis present

## 2022-07-05 DIAGNOSIS — Z888 Allergy status to other drugs, medicaments and biological substances status: Secondary | ICD-10-CM

## 2022-07-05 DIAGNOSIS — Z7902 Long term (current) use of antithrombotics/antiplatelets: Secondary | ICD-10-CM

## 2022-07-05 DIAGNOSIS — R7401 Elevation of levels of liver transaminase levels: Secondary | ICD-10-CM | POA: Diagnosis present

## 2022-07-05 DIAGNOSIS — I10 Essential (primary) hypertension: Secondary | ICD-10-CM | POA: Diagnosis present

## 2022-07-05 DIAGNOSIS — T466X5A Adverse effect of antihyperlipidemic and antiarteriosclerotic drugs, initial encounter: Secondary | ICD-10-CM | POA: Diagnosis not present

## 2022-07-05 DIAGNOSIS — Z7984 Long term (current) use of oral hypoglycemic drugs: Secondary | ICD-10-CM

## 2022-07-05 DIAGNOSIS — Z83438 Family history of other disorder of lipoprotein metabolism and other lipidemia: Secondary | ICD-10-CM

## 2022-07-05 DIAGNOSIS — Z881 Allergy status to other antibiotic agents status: Secondary | ICD-10-CM

## 2022-07-05 DIAGNOSIS — Z808 Family history of malignant neoplasm of other organs or systems: Secondary | ICD-10-CM

## 2022-07-05 DIAGNOSIS — M419 Scoliosis, unspecified: Secondary | ICD-10-CM | POA: Diagnosis present

## 2022-07-05 DIAGNOSIS — B351 Tinea unguium: Secondary | ICD-10-CM | POA: Diagnosis present

## 2022-07-05 DIAGNOSIS — E785 Hyperlipidemia, unspecified: Secondary | ICD-10-CM | POA: Diagnosis present

## 2022-07-05 DIAGNOSIS — I5030 Unspecified diastolic (congestive) heart failure: Secondary | ICD-10-CM | POA: Diagnosis present

## 2022-07-05 DIAGNOSIS — R351 Nocturia: Secondary | ICD-10-CM | POA: Diagnosis present

## 2022-07-05 DIAGNOSIS — G720 Drug-induced myopathy: Secondary | ICD-10-CM

## 2022-07-05 DIAGNOSIS — N401 Enlarged prostate with lower urinary tract symptoms: Secondary | ICD-10-CM | POA: Diagnosis present

## 2022-07-05 DIAGNOSIS — Z833 Family history of diabetes mellitus: Secondary | ICD-10-CM

## 2022-07-05 DIAGNOSIS — Z79899 Other long term (current) drug therapy: Secondary | ICD-10-CM

## 2022-07-05 DIAGNOSIS — I2510 Atherosclerotic heart disease of native coronary artery without angina pectoris: Secondary | ICD-10-CM | POA: Diagnosis present

## 2022-07-05 DIAGNOSIS — E1169 Type 2 diabetes mellitus with other specified complication: Secondary | ICD-10-CM | POA: Diagnosis present

## 2022-07-05 DIAGNOSIS — I255 Ischemic cardiomyopathy: Secondary | ICD-10-CM | POA: Diagnosis present

## 2022-07-05 DIAGNOSIS — I5022 Chronic systolic (congestive) heart failure: Secondary | ICD-10-CM | POA: Diagnosis present

## 2022-07-05 DIAGNOSIS — Z7982 Long term (current) use of aspirin: Secondary | ICD-10-CM

## 2022-07-05 DIAGNOSIS — I25118 Atherosclerotic heart disease of native coronary artery with other forms of angina pectoris: Secondary | ICD-10-CM | POA: Diagnosis present

## 2022-07-05 DIAGNOSIS — E119 Type 2 diabetes mellitus without complications: Secondary | ICD-10-CM | POA: Diagnosis present

## 2022-07-05 LAB — URINALYSIS, ROUTINE W REFLEX MICROSCOPIC
Bacteria, UA: NONE SEEN
Bilirubin Urine: NEGATIVE
Glucose, UA: >=500 mg/dL — AB
Hgb urine dipstick: NEGATIVE
Ketones, ur: NEGATIVE mg/dL
Leukocytes,Ua: NEGATIVE
Nitrite: NEGATIVE
Protein, ur: NEGATIVE mg/dL
Specific Gravity, Urine: 1.023 (ref 1.005–1.030)
pH: 5 (ref 5.0–8.0)

## 2022-07-05 LAB — CBC
HCT: 39.2 % (ref 39.0–52.0)
Hemoglobin: 13.4 g/dL (ref 13.0–17.0)
MCH: 32.4 pg (ref 26.0–34.0)
MCHC: 34.2 g/dL (ref 30.0–36.0)
MCV: 94.7 fL (ref 80.0–100.0)
Platelets: 224 10*3/uL (ref 150–400)
RBC: 4.14 MIL/uL — ABNORMAL LOW (ref 4.22–5.81)
RDW: 15.1 % (ref 11.5–15.5)
WBC: 15.4 10*3/uL — ABNORMAL HIGH (ref 4.0–10.5)
nRBC: 0 % (ref 0.0–0.2)

## 2022-07-05 LAB — TSH: TSH: 2.307 u[IU]/mL (ref 0.350–4.500)

## 2022-07-05 LAB — BASIC METABOLIC PANEL
Anion gap: 5 (ref 5–15)
BUN: 23 mg/dL (ref 8–23)
CO2: 29 mmol/L (ref 22–32)
Calcium: 8.6 mg/dL — ABNORMAL LOW (ref 8.9–10.3)
Chloride: 96 mmol/L — ABNORMAL LOW (ref 98–111)
Creatinine, Ser: 0.74 mg/dL (ref 0.61–1.24)
GFR, Estimated: >60 mL/min (ref >60)
Glucose, Bld: 146 mg/dL — ABNORMAL HIGH (ref 70–99)
Potassium: 4.3 mmol/L (ref 3.5–5.1)
Sodium: 130 mmol/L — ABNORMAL LOW (ref 135–145)

## 2022-07-05 LAB — HEPATIC FUNCTION PANEL
ALT: 146 U/L — ABNORMAL HIGH (ref 0–44)
AST: 308 U/L — ABNORMAL HIGH (ref 15–41)
Albumin: 3.5 g/dL (ref 3.5–5.0)
Alkaline Phosphatase: 75 U/L (ref 38–126)
Bilirubin, Direct: 0.2 mg/dL (ref 0.0–0.2)
Indirect Bilirubin: 0.9 mg/dL (ref 0.3–0.9)
Total Bilirubin: 1.1 mg/dL (ref 0.3–1.2)
Total Protein: 6.1 g/dL — ABNORMAL LOW (ref 6.5–8.1)

## 2022-07-05 LAB — TROPONIN I (HIGH SENSITIVITY): Troponin I (High Sensitivity): 32 ng/L — ABNORMAL HIGH (ref <18)

## 2022-07-05 LAB — LACTATE DEHYDROGENASE: LDH: 390 U/L — ABNORMAL HIGH (ref 98–192)

## 2022-07-05 LAB — SEDIMENTATION RATE: Sed Rate: 24 mm/hr — ABNORMAL HIGH (ref 0–20)

## 2022-07-05 LAB — CK: Total CK: 4440 U/L — ABNORMAL HIGH (ref 49–397)

## 2022-07-05 MED ORDER — ISOSORBIDE MONONITRATE ER 60 MG PO TB24
30.0000 mg | ORAL_TABLET | Freq: Two times a day (BID) | ORAL | Status: DC
  Administered 2022-07-05 – 2022-07-10 (×8): 30 mg via ORAL
  Filled 2022-07-05 (×10): qty 1

## 2022-07-05 MED ORDER — DULOXETINE HCL 60 MG PO CPEP
60.0000 mg | ORAL_CAPSULE | Freq: Every day | ORAL | Status: DC
  Administered 2022-07-05 – 2022-07-09 (×5): 60 mg via ORAL
  Filled 2022-07-05 (×2): qty 2
  Filled 2022-07-05 (×3): qty 1
  Filled 2022-07-05: qty 2
  Filled 2022-07-05 (×2): qty 1

## 2022-07-05 MED ORDER — ACETAMINOPHEN 325 MG PO TABS
650.0000 mg | ORAL_TABLET | Freq: Four times a day (QID) | ORAL | Status: DC | PRN
  Administered 2022-07-06 – 2022-07-10 (×9): 650 mg via ORAL
  Filled 2022-07-05 (×9): qty 2

## 2022-07-05 MED ORDER — ACETAMINOPHEN 650 MG RE SUPP: 650.0000 mg | Freq: Four times a day (QID) | RECTAL | Status: DC | PRN

## 2022-07-05 MED ORDER — ENOXAPARIN SODIUM 40 MG/0.4ML IJ SOSY
40.0000 mg | PREFILLED_SYRINGE | INTRAMUSCULAR | Status: DC
  Administered 2022-07-05 – 2022-07-09 (×5): 40 mg via SUBCUTANEOUS
  Filled 2022-07-05 (×5): qty 0.4

## 2022-07-05 MED ORDER — CLOPIDOGREL BISULFATE 75 MG PO TABS
75.0000 mg | ORAL_TABLET | Freq: Every day | ORAL | Status: DC
  Administered 2022-07-05 – 2022-07-10 (×6): 75 mg via ORAL
  Filled 2022-07-05 (×6): qty 1

## 2022-07-05 MED ORDER — SODIUM CHLORIDE 0.9 % IV BOLUS
1000.0000 mL | Freq: Once | INTRAVENOUS | Status: AC
  Administered 2022-07-05: 1000 mL via INTRAVENOUS

## 2022-07-05 MED ORDER — FINASTERIDE 5 MG PO TABS
5.0000 mg | ORAL_TABLET | Freq: Every day | ORAL | Status: DC
  Administered 2022-07-06 – 2022-07-10 (×5): 5 mg via ORAL
  Filled 2022-07-05 (×5): qty 1

## 2022-07-05 MED ORDER — ONDANSETRON HCL 4 MG PO TABS: 4.0000 mg | ORAL_TABLET | Freq: Four times a day (QID) | ORAL | Status: DC | PRN

## 2022-07-05 MED ORDER — CARVEDILOL 6.25 MG PO TABS
6.2500 mg | ORAL_TABLET | Freq: Two times a day (BID) | ORAL | Status: DC
  Administered 2022-07-05 – 2022-07-10 (×8): 6.25 mg via ORAL
  Filled 2022-07-05: qty 2
  Filled 2022-07-05 (×2): qty 1
  Filled 2022-07-05: qty 2
  Filled 2022-07-05 (×4): qty 1
  Filled 2022-07-05: qty 2
  Filled 2022-07-05 (×2): qty 1
  Filled 2022-07-05 (×2): qty 2
  Filled 2022-07-05: qty 1
  Filled 2022-07-05: qty 2
  Filled 2022-07-05 (×2): qty 1

## 2022-07-05 MED ORDER — PANTOPRAZOLE SODIUM 40 MG PO TBEC
40.0000 mg | DELAYED_RELEASE_TABLET | Freq: Every day | ORAL | Status: DC
  Administered 2022-07-05 – 2022-07-10 (×6): 40 mg via ORAL
  Filled 2022-07-05 (×6): qty 1

## 2022-07-05 MED ORDER — ASPIRIN 81 MG PO TBEC
81.0000 mg | DELAYED_RELEASE_TABLET | Freq: Every day | ORAL | Status: DC
  Administered 2022-07-05 – 2022-07-10 (×6): 81 mg via ORAL
  Filled 2022-07-05 (×6): qty 1

## 2022-07-05 MED ORDER — ONDANSETRON HCL 4 MG/2ML IJ SOLN: 4.0000 mg | Freq: Four times a day (QID) | INTRAMUSCULAR | Status: DC | PRN

## 2022-07-05 MED ORDER — SODIUM CHLORIDE 0.9 % IV SOLN: INTRAVENOUS | Status: DC

## 2022-07-05 MED ORDER — OXYCODONE HCL 5 MG PO TABS
5.0000 mg | ORAL_TABLET | Freq: Once | ORAL | Status: AC
  Administered 2022-07-05: 5 mg via ORAL
  Filled 2022-07-05: qty 1

## 2022-07-05 NOTE — ED Triage Notes (Signed)
Patient to ED via POV for abnormal labs- CK of 5872. Patient c/o back and bilateral thigh pain. Pain ongoing for the 3-4 weeks.

## 2022-07-05 NOTE — Assessment & Plan Note (Signed)
Holding statin in setting of rhabdo

## 2022-07-05 NOTE — Assessment & Plan Note (Signed)
White count 15 on presentation Suspect likely reactive in setting of rhabdomyolysis Patient also fairly dry Will gently hydrate patient Monitor for now-trend with treatment Reassess if patient spikes a fever

## 2022-07-05 NOTE — Assessment & Plan Note (Signed)
Continue proscar  ?

## 2022-07-05 NOTE — Assessment & Plan Note (Signed)
2D ECHO 04/2022 w/ EF 30-35%  Euvolemic  Gentle IVF hydration in setting of rhabdomyolysis Cont home regimen  Strict Is and Os and daily weights

## 2022-07-05 NOTE — Assessment & Plan Note (Signed)
BP stable Titrate home regimen 

## 2022-07-05 NOTE — Assessment & Plan Note (Signed)
>>  ASSESSMENT AND PLAN FOR CORONARY ARTERY DISEASE OF NATIVE HEART WITH STABLE ANGINA PECTORIS (HCC) WRITTEN ON 07/05/2022  6:02 PM BY Floydene Flock, MD  s/p CABG 2001 and redo CABG x2 2012

## 2022-07-05 NOTE — ED Provider Notes (Signed)
[facility] Provider Note    Event Date/Time   First MD Initiated Contact with Patient 07/05/22 1529     (approximate)  History   Chief Complaint: Abnormal Labs  HPI  Jacob Harrison is a 82 y.o. male with a past medical history of CHF status post CABG, diabetes, hypertension, hyperlipidemia who presents to the emergency department for an elevated CK.  According to the patient for the past month or so he has been experiencing pain in both of his thighs as well as his lower back.  He states that history of lower back pain however he states the thigh pain is new.  Patient states he is on Lipitor, but has been on this medication for at least 5 or 6 years.  Denies any trauma, states he used to workout but has not been working out much over the past 2 to 3 months.  Denies any heavy use.  No history of elevated CK previously.  Per patient his CK was elevated almost to 6000.  Patient denies any chest pain.  States he has noted some mild shortness of breath with exertion over the last 3 to 4 months.  States he has been seeing his cardiologist Dr. Saunders Revel regarding this.  Denies any chest pain though at any point.  Physical Exam   Triage Vital Signs: ED Triage Vitals [07/05/22 1509]  Enc Vitals Group     BP 100/74     Pulse Rate 72     Resp 18     Temp 98.3 F (36.8 C)     Temp Source Oral     SpO2 96 %     Weight      Height      Head Circumference      Peak Flow      Pain Score 3     Pain Loc      Pain Edu?      Excl. in [place]?     Most recent vital signs: Vitals:   07/05/22 1509  BP: 100/74  Pulse: 72  Resp: 18  Temp: 98.3 F (36.8 C)  SpO2: 96%    General: Awake, no distress.  CV:  Good peripheral perfusion.  Regular rate and rhythm  Resp:  Normal effort.  Equal breath sounds bilaterally.  Abd:  No distention.  Soft, nontender.  No rebound or guarding. Other:  Patient's distal extremities and his feet are somewhat cool to the touch however  strong Doppler pulses.  States mild tenderness to palpation of the thighs mostly over the quadricep area.  Good range of motion in the legs although states pain in his thighs/quads when ranging the legs.   ED Results / Procedures / Treatments   EKG  EKG viewed and interpreted by myself shows a normal sinus rhythm at 64 bpm with a narrow QRS, normal axis, normal intervals besides slight PR prolongation, nonspecific ST changes without ST elevation  MEDICATIONS ORDERED IN ED: Medications  sodium chloride 0.9 % bolus 1,000 mL (has no administration in time range)     IMPRESSION / MDM / ASSESSMENT AND PLAN / ED COURSE  I reviewed the triage vital signs and the nursing notes.  Patient's presentation is most consistent with acute presentation with potential threat to life or bodily function.  Patient presents emergency department for reported elevated CK.  He is also experiencing pain in the bilateral thighs and lower back for the past 3 to 4 weeks.  Does have some tenderness  to these areas.  We will recheck labs including a CK I have also added on a troponin as a precaution and we will obtain an EKG.  Will begin IV hydration while awaiting his CK result.  Differential could include medication induced rhabdomyolysis, seems to be less likely trauma or use induced rhabdomyolysis, could possibly be related to ACS we will check an EKG and troponin although no chest pain.  If the patient CKs is elevated as it was in the outpatient setting anticipate admission to the hospital service for further workup treatment and to ensure adequate kidney function.   Patient's labs have resulted showing mild leukocytosis otherwise reassuring CBC, chemistry shows normal renal function, troponin slightly elevated at 32 however CK significantly elevated at 4440.  Suggesting rhabdomyolysis, hepatic function panel is pending.  Patient receiving IV fluids.  Will admit to the hospital service for further workup and  treatment.  FINAL CLINICAL IMPRESSION(S) / ED DIAGNOSES   Rhabdomyolysis   Note:  This document was prepared using Dragon voice recognition software and may include unintentional dictation errors.   Harvest Dark, MD 07/05/22 [phone]

## 2022-07-05 NOTE — Assessment & Plan Note (Addendum)
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

## 2022-07-05 NOTE — Assessment & Plan Note (Addendum)
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

## 2022-07-05 NOTE — Assessment & Plan Note (Addendum)
S/p AICD Placement 03/2021

## 2022-07-05 NOTE — Assessment & Plan Note (Signed)
No active CP  Cont home regimen   

## 2022-07-05 NOTE — H&P (Signed)
History and Physical    Patient: Jacob Harrison [id number] DOB: 1940/10/19 DOA: 07/05/2022 DOS: the patient was seen and examined on 07/05/2022 PCP: Velva Harman, PA  Patient coming from: Home  Chief Complaint:  Chief Complaint  Patient presents with   Abnormal Labs   HPI: Jacob Harrison is a 82 y.o. 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  Patient reports worsening bilateral thigh pain and weakness over the past 2 months.  Has noted to be on statins for several years.  Has had worsening weakness and pain especially with ambulation.  No numbness or tingling.  Has also had some secondary back pain.  Patient denies any recent medication dosing change.  No reported recent infections.  No fevers or chills.  No reported rash.  No reported bowel or bladder incontinence.  Patient does admit to an episode where he was lifting a heavy bread maker approximately 2 months ago.  Symptoms immediately started after this point.  Was evaluated at his PCPs office within the past week for symptoms.  Was diagnosed with elevated CK level.  Patient is still been taking statin therapy.  No reported alcohol or tobacco use.  No chest pain or shortness of breath.  No nausea or vomiting.  No abdominal pain. Presented to the ER afebrile, hemodynamically stable.  White count 15.4, hemoglobin 13.4, troponin 32, creatinine 0.74, CK level 4400.  Noted CK level 3 days ago at 5800.  AST 308, ALT 146. Review of Systems: As mentioned in the history of present illness. All other systems reviewed and are negative. Past Medical History:  Diagnosis Date   (HFpEF) heart failure with preserved ejection fraction ([place])    a. 10/2016 Echo: EF 65-70%, mild LVH, mildly dil LA. RV fxn nl.   Arthritis    BPH (benign prostatic hyperplasia)    Coronary  artery disease    a. 2001 CABG x 3 [facility]): LIMA->LAD, VG->LCX, VG->RPDA; b. 04/05/2010 Cath ([place]): LM 35m, LAD 13m, LCX 94m, RCA 100p, LIMA->LAD atretic, VG->LCX 100, VG->RCA 80ost, 70d; c. 04/2010 Redo CABG x 2 ([place]): VG->LAD, VG->RPDA; d. 10/2012 Cath [facility]): LM 20-30, LAD 100/95-43m, LCX 40-30m, RCA 100p, 80-100d, RPDA 99, RPL1 95, VG->LAD ok, VG->RPDA ok, EF 67%; e. 11/2016 MV: EF 48%, No ischemia.   Diabetes mellitus without complication ([place])    Type II   Hyperlipidemia    Hypertension    Ischemic cardiomyopathy    a. 03/2021 s/p MDT Visia AF MRI VR SureScan single lead AICD (ser# [id number]).   Scoliosis    Spinal stenosis    Past Surgical History:  Procedure Laterality Date   CARDIAC CATHETERIZATION     CORONARY ARTERY BYPASS GRAFT     x 2 (2001 and redo in late 2011 or early 2012)   HERNIA REPAIR Left    inguinal   ICD IMPLANT N/A 03/20/2021   Procedure: ICD IMPLANT;  Surgeon: Vickie Epley, MD;  Location: [place] [facility];  Service: Cardiovascular;  Laterality: N/A;   LEFT HEART CATH AND CORS/GRAFTS ANGIOGRAPHY N/A 10/04/2020   Procedure: LEFT HEART CATH AND CORS/GRAFTS ANGIOGRAPHY;  Surgeon: Leonie Man, MD;  Location: [place] [facility];  Service: Cardiovascular;  Laterality: N/A;   LUMBAR LAMINECTOMY/DECOMPRESSION MICRODISCECTOMY N/A 10/20/2018   Procedure: L3-4, L4-5 LUMBAR DECOMPRESSION with dural repair.;  Surgeon: Marybelle Killings, MD;  Location: [place];  Service: Orthopedics;  Laterality: N/A;   RIGHT/LEFT HEART CATH AND CORONARY/GRAFT ANGIOGRAPHY N/A 05/27/2022   Procedure: RIGHT/LEFT HEART CATH AND CORONARY/GRAFT ANGIOGRAPHY;  Surgeon: Nelva Bush, MD;  Location: [place] [facility];  Service: Cardiovascular;  Laterality: N/A;   TONSILLECTOMY AND ADENOIDECTOMY     TRANSURETHRAL RESECTION OF PROSTATE  1990   Social History:  reports that he has never smoked. He has never been exposed to tobacco smoke. He has never used smokeless tobacco. He  reports that he does not currently use alcohol. He reports that he does not use drugs.  Allergies  Allergen Reactions   Other Itching and Rash    Steroid injection in 2021 caused rash/itching   Tetracyclines & Related Rash    Family History  Problem Relation Age of Onset   Diabetes Mother    Hyperlipidemia Mother    Cancer Father        colon   Skin cancer Brother    Alcohol abuse Maternal Uncle    Diabetes Maternal Uncle    Hyperlipidemia Maternal Uncle    Cancer Paternal Uncle        colon    Prior to Admission medications   Medication Sig Start Date End Date Taking? Authorizing Provider  acetaminophen (TYLENOL) 500 MG tablet Take 1,000 mg by mouth every 6 (six) hours as needed for moderate pain or headache.    [provider]  aspirin EC 81 MG tablet Take 1 tablet (81 mg total) by mouth daily. 09/26/16   End, Harrell Gave, MD  carvedilol (COREG) 12.5 MG tablet Take 0.5 tablets (6.25 mg total) by mouth 2 (two) times daily with a meal. 05/27/22   End, Harrell Gave, MD  Cholecalciferol (VITAMIN D-3 PO) Take 800 Units by mouth daily.    [provider]  clopidogrel (PLAVIX) 75 MG tablet TAKE 1 TABLET (75 MG TOTAL) BY MOUTH DAILY. **NO PLAVIX UNTIL 12/20** 04/21/22   Furth, Cadence H, PA-C  Coenzyme Q10 (CO Q-10) 200 MG CAPS Take 200 mg by mouth daily.     [provider]  cyanocobalamin 500 MCG tablet Take 500 mcg by mouth daily.    [provider]  diclofenac Sodium (VOLTAREN) 1 % GEL Apply 2 g topically 2 (two) times daily as needed. 02/22/19   Danford, Valetta Fuller D, NP  DULoxetine (CYMBALTA) 30 MG capsule Take 2 capsules (60 mg total) by mouth at bedtime. Take 1 capsule at night for 7 days, then increase to 2 capsules nightly. 07/02/22 08/01/22  Benay Pike, MD  empagliflozin (JARDIANCE) 10 MG TABS tablet Take 1 tablet (10 mg total) by mouth daily before breakfast. 06/23/22   Furth, Cadence H, PA-C  ezetimibe (ZETIA) 10 MG tablet TAKE 1 TABLET BY MOUTH  EVERY DAY 01/22/22   [name], PA-C  famotidine (PEPCID) 20 MG tablet One after supper 06/13/22   Tanda Rockers, MD  finasteride (PROSCAR) 5 MG tablet TAKE 1 TABLET BY MOUTH EVERY DAY 02/19/22   Abonza, Maritza, PA-C  furosemide (LASIX) 40 MG tablet TAKE 1 TABLET BY MOUTH EVERY DAY 05/15/22   End, Harrell Gave, MD  Glucosamine-Chondroit-Vit C-Mn (GLUCOSAMINE 1500 COMPLEX) CAPS Take 1 capsule by mouth daily.    [provider]  isosorbide mononitrate (IMDUR) 30 MG 24 hr tablet Take 1 tablet (30 mg total) by mouth 2 (two) times daily. 04/11/22   End, Harrell Gave, MD  Lancets [facility] ULTRASOFT) lancets Use to check blood sugars fasting daily 02/03/18   Danford, Valetta Fuller D, NP  losartan (COZAAR) 25  MG tablet Take 0.5 tablets (12.5 mg total) by mouth daily. 09/04/21   Furth, Cadence H, PA-C  Multiple Vitamins-Minerals (PRESERVISION AREDS 2 PO) Take 1 capsule by mouth 2 (two) times a day.    [provider]  nitroGLYCERIN (NITROSTAT) 0.4 MG SL tablet PLACE 1 TABLET UNDER THE TONGUE EVERY 5 MINUTES AS NEEDED. 04/14/22   End, Harrell Gave, MD  pantoprazole (PROTONIX) 40 MG tablet Take 1 tablet (40 mg total) by mouth daily. Take 30-60 min before first meal of the day 06/13/22   Tanda Rockers, MD  ranolazine (RANEXA) 1000 MG SR tablet TAKE 1 TABLET BY MOUTH TWICE A DAY 03/24/22   End, Harrell Gave, MD  rosuvastatin (CRESTOR) 20 MG tablet TAKE 1 TABLET BY MOUTH EVERYDAY AT BEDTIME 01/03/22   Abonza, Maritza, PA-C  terbinafine (LAMISIL) 250 MG tablet Take 1 tablet (250 mg total) by mouth daily. 06/19/22   Ralene Bathe, MD  traMADol (ULTRAM) 50 MG tablet Take 1 tablet (50 mg total) by mouth every 8 (eight) hours as needed (pain). 07/04/22   Barrett Henle, MD  triamcinolone cream (KENALOG) 0.1 % Apply 1 Application topically as directed. Qd up to 5 days per week to itchy rash on body until clear, avoid face, groin, axilla 06/19/22   Ralene Bathe, MD    Physical Exam: Vitals:   07/05/22  1615 07/05/22 1630 07/05/22 1700 07/05/22 1730  BP: (!) 103/55 (!) 99/59 116/72 (!) 105/57  Pulse: 64 66 77 73  Resp: 16 13 15 11   Temp:      TempSrc:      SpO2: 97% 98% 98% 98%   Physical Exam Constitutional:      General: He is not in acute distress.    Appearance: He is obese.  HENT:     Head: Normocephalic and atraumatic.     Nose: Nose normal.     Mouth/Throat:     Mouth: Mucous membranes are moist.  Eyes:     Pupils: Pupils are equal, round, and reactive to light.  Cardiovascular:     Rate and Rhythm: Normal rate and regular rhythm.  Pulmonary:     Effort: Pulmonary effort is normal.  Abdominal:     General: Bowel sounds are normal.  Musculoskeletal:     Cervical back: Normal range of motion.     Comments: Positive weakness and decreased range of motion in proximal thighs bilaterally  Skin:    General: Skin is warm.  Neurological:     Comments: Positive bilaterally lower extremity thigh weakness and mild tenderness palpation bilaterally Otherwise grossly stable neuroexam  Psychiatric:        Mood and Affect: Mood normal.     Data Reviewed:  There are no new results to review at this time. XR Lumbar Spine 2-3 Views AP lateral lumbar images and lateral flexion-extension images obtained and  reviewed.  This shows previous decompression L3-4 L4-5.  Patient has  progressive lumbar curvature now 15 degrees T12-L3 and L3-L5 with a right  upper lumbar and left lower lumbar curvature.  Prominent lateral  osteophytes and facet degenerative spurring is noted.  Impression: Progressive lumbar curvature with facet arthritis.  Lab Results  Component Value Date   WBC 15.4 (H) 07/05/2022   HGB 13.4 07/05/2022   HCT 39.2 07/05/2022   MCV 94.7 07/05/2022   PLT 224 [id number]   Last metabolic panel Lab Results  Component Value Date   GLUCOSE 146 (H) 07/05/2022   NA 130 (L) 07/05/2022  K 4.3 07/05/2022   CL 96 (L) 07/05/2022   CO2 29 07/05/2022   BUN 23 07/05/2022    CREATININE 0.74 07/05/2022   GFRNONAA >60 07/05/2022   CALCIUM 8.6 (L) 07/05/2022   PROT 6.1 (L) 07/05/2022   ALBUMIN 3.5 07/05/2022   LABGLOB 1.8 02/17/2022   AGRATIO 2.6 (H) 02/17/2022   BILITOT 1.1 07/05/2022   ALKPHOS 75 07/05/2022   AST 308 (H) 07/05/2022   ALT 146 (H) 07/05/2022   ANIONGAP 5 07/05/2022    Assessment and Plan: * Rhabdomyolysis CK 5000s-4000s with associated low back and bilateral thigh pain and weakness with episode of overexertion roughly 2 months ago in setting of statin use 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 Will hold statin Will also hold zetia as myalgia is a side effect  Will also check ESR, TSH, LDH  Gentle IVF hydration in setting of HFrEF  Pain control  Follow     Coronary artery disease of native heart with stable angina pectoris ([place]) s/p CABG 2001 and redo CABG x2 2012   Transaminitis AST 308, ALT 146 Suspect secondary to statin myopathy Will check Tylenol level, hepatitis panel Inflammatory markers pending Hold potential offending medications Follow  Leukocytosis White count 15 on presentation Suspect likely reactive in setting of rhabdomyolysis Patient also fairly dry Will gently hydrate patient Monitor for now-trend with treatment Reassess if patient spikes a fever  BPH associated with nocturia Continue proscar   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   (HFpEF) heart failure with preserved ejection fraction ([place]) 2D ECHO 04/2022 w/ EF 30-35%  Euvolemic  Gentle IVF hydration in setting of rhabdomyolysis Cont home regimen  Strict Is and Os and daily weights    Coronary artery disease of native artery of native heart with stable angina pectoris (HCC) No active CP  Cont home regimen    Hyperlipidemia LDL goal <70 Holding statin in setting of rhabdo  Essential hypertension BP stable  Titrate home regimen    Type 2 diabetes mellitus without complication, without long-term current use of  insulin ([place]) SSI A1C      Advance Care Planning:   Code Status: Full Code   Consults: None   Family Communication: Wife at bedside   Severity of Illness: The appropriate patient status for this patient is OBSERVATION. Observation status is judged to be reasonable and necessary in order to provide the required intensity of service to ensure the patient's safety. The patient's presenting symptoms, physical exam findings, and initial radiographic and laboratory data in the context of their medical condition is felt to place them at decreased risk for further clinical deterioration. Furthermore, it is anticipated that the patient will be medically stable for discharge from the hospital within 2 midnights of admission.   Author: Deneise Lever, MD 07/05/2022 6:03 PM  For on call review [email or web address].

## 2022-07-05 NOTE — Assessment & Plan Note (Signed)
s/p CABG 2001 and redo CABG x2 2012

## 2022-07-05 NOTE — Assessment & Plan Note (Signed)
SSI  A1C  

## 2022-07-06 DIAGNOSIS — I25118 Atherosclerotic heart disease of native coronary artery with other forms of angina pectoris: Secondary | ICD-10-CM | POA: Diagnosis not present

## 2022-07-06 DIAGNOSIS — Z808 Family history of malignant neoplasm of other organs or systems: Secondary | ICD-10-CM | POA: Diagnosis not present

## 2022-07-06 DIAGNOSIS — Z7984 Long term (current) use of oral hypoglycemic drugs: Secondary | ICD-10-CM | POA: Diagnosis not present

## 2022-07-06 DIAGNOSIS — Z951 Presence of aortocoronary bypass graft: Secondary | ICD-10-CM | POA: Diagnosis not present

## 2022-07-06 DIAGNOSIS — E785 Hyperlipidemia, unspecified: Secondary | ICD-10-CM | POA: Diagnosis not present

## 2022-07-06 DIAGNOSIS — E119 Type 2 diabetes mellitus without complications: Secondary | ICD-10-CM | POA: Diagnosis not present

## 2022-07-06 DIAGNOSIS — I10 Essential (primary) hypertension: Secondary | ICD-10-CM

## 2022-07-06 DIAGNOSIS — M6282 Rhabdomyolysis: Secondary | ICD-10-CM | POA: Diagnosis not present

## 2022-07-06 DIAGNOSIS — T466X5A Adverse effect of antihyperlipidemic and antiarteriosclerotic drugs, initial encounter: Secondary | ICD-10-CM | POA: Diagnosis not present

## 2022-07-06 DIAGNOSIS — Z881 Allergy status to other antibiotic agents status: Secondary | ICD-10-CM | POA: Diagnosis not present

## 2022-07-06 DIAGNOSIS — Z7982 Long term (current) use of aspirin: Secondary | ICD-10-CM | POA: Diagnosis not present

## 2022-07-06 DIAGNOSIS — Z888 Allergy status to other drugs, medicaments and biological substances status: Secondary | ICD-10-CM | POA: Diagnosis not present

## 2022-07-06 DIAGNOSIS — D72829 Elevated white blood cell count, unspecified: Secondary | ICD-10-CM | POA: Diagnosis not present

## 2022-07-06 DIAGNOSIS — Z833 Family history of diabetes mellitus: Secondary | ICD-10-CM | POA: Diagnosis not present

## 2022-07-06 DIAGNOSIS — M419 Scoliosis, unspecified: Secondary | ICD-10-CM | POA: Diagnosis not present

## 2022-07-06 DIAGNOSIS — I110 Hypertensive heart disease with heart failure: Secondary | ICD-10-CM | POA: Diagnosis not present

## 2022-07-06 DIAGNOSIS — I255 Ischemic cardiomyopathy: Secondary | ICD-10-CM | POA: Diagnosis not present

## 2022-07-06 DIAGNOSIS — N401 Enlarged prostate with lower urinary tract symptoms: Secondary | ICD-10-CM | POA: Diagnosis not present

## 2022-07-06 DIAGNOSIS — Z83438 Family history of other disorder of lipoprotein metabolism and other lipidemia: Secondary | ICD-10-CM | POA: Diagnosis not present

## 2022-07-06 DIAGNOSIS — T490X5A Adverse effect of local antifungal, anti-infective and anti-inflammatory drugs, initial encounter: Secondary | ICD-10-CM | POA: Diagnosis not present

## 2022-07-06 DIAGNOSIS — I2510 Atherosclerotic heart disease of native coronary artery without angina pectoris: Secondary | ICD-10-CM | POA: Diagnosis not present

## 2022-07-06 DIAGNOSIS — B351 Tinea unguium: Secondary | ICD-10-CM | POA: Diagnosis not present

## 2022-07-06 DIAGNOSIS — R351 Nocturia: Secondary | ICD-10-CM | POA: Diagnosis not present

## 2022-07-06 DIAGNOSIS — Z95810 Presence of automatic (implantable) cardiac defibrillator: Secondary | ICD-10-CM | POA: Diagnosis not present

## 2022-07-06 DIAGNOSIS — I5022 Chronic systolic (congestive) heart failure: Secondary | ICD-10-CM | POA: Diagnosis not present

## 2022-07-06 DIAGNOSIS — R7401 Elevation of levels of liver transaminase levels: Secondary | ICD-10-CM | POA: Diagnosis not present

## 2022-07-06 LAB — COMPREHENSIVE METABOLIC PANEL
ALT: 114 U/L — ABNORMAL HIGH (ref 0–44)
AST: 236 U/L — ABNORMAL HIGH (ref 15–41)
Albumin: 3 g/dL — ABNORMAL LOW (ref 3.5–5.0)
Alkaline Phosphatase: 60 U/L (ref 38–126)
Anion gap: 7 (ref 5–15)
BUN: 16 mg/dL (ref 8–23)
CO2: 24 mmol/L (ref 22–32)
Calcium: 8.2 mg/dL — ABNORMAL LOW (ref 8.9–10.3)
Chloride: 100 mmol/L (ref 98–111)
Creatinine, Ser: 0.52 mg/dL — ABNORMAL LOW (ref 0.61–1.24)
GFR, Estimated: >60 mL/min (ref >60)
Glucose, Bld: 89 mg/dL (ref 70–99)
Potassium: 3.6 mmol/L (ref 3.5–5.1)
Sodium: 131 mmol/L — ABNORMAL LOW (ref 135–145)
Total Bilirubin: 1.1 mg/dL (ref 0.3–1.2)
Total Protein: 5.2 g/dL — ABNORMAL LOW (ref 6.5–8.1)

## 2022-07-06 LAB — CBC
HCT: 33.5 % — ABNORMAL LOW (ref 39.0–52.0)
Hemoglobin: 11.7 g/dL — ABNORMAL LOW (ref 13.0–17.0)
MCH: 32.7 pg (ref 26.0–34.0)
MCHC: 34.9 g/dL (ref 30.0–36.0)
MCV: 93.6 fL (ref 80.0–100.0)
Platelets: 212 10*3/uL (ref 150–400)
RBC: 3.58 MIL/uL — ABNORMAL LOW (ref 4.22–5.81)
RDW: 15.1 % (ref 11.5–15.5)
WBC: 11.2 10*3/uL — ABNORMAL HIGH (ref 4.0–10.5)
nRBC: 0 % (ref 0.0–0.2)

## 2022-07-06 LAB — CK: Total CK: 3040 U/L — ABNORMAL HIGH (ref 49–397)

## 2022-07-06 LAB — HEPATITIS PANEL, ACUTE
HCV Ab: NONREACTIVE
Hep A IgM: NONREACTIVE
Hep B C IgM: NONREACTIVE
Hepatitis B Surface Ag: NONREACTIVE

## 2022-07-06 MED ORDER — MORPHINE SULFATE (PF) 2 MG/ML IV SOLN: 2.0000 mg | Freq: Once | INTRAVENOUS | Status: DC

## 2022-07-06 MED ORDER — MORPHINE SULFATE (PF) 2 MG/ML IV SOLN
2.0000 mg | Freq: Once | INTRAVENOUS | Status: AC | PRN
  Administered 2022-07-06: 2 mg via INTRAVENOUS
  Filled 2022-07-06: qty 1

## 2022-07-06 NOTE — Progress Notes (Signed)
Pt's pet [place] "Chubby" checked in to visit Pt. Current vaccination status checked via dog tags which are up-to date. Pt's wife informed staff that she will bring proper documentation from [facility] tomorrow when paperwork is available. Staff also requested, and spouse agreed, to have pet brought in via enclosed carrier instead of handbag. Administrative Coordinator, Malachy Mood, made aware and she is agreeable. Will continue to monitor.

## 2022-07-06 NOTE — Plan of Care (Signed)
  Problem: Education: Goal: Knowledge of General Education information will improve Description: Including pain rating scale, medication(s)/side effects and non-pharmacologic comfort measures Outcome: Progressing   Problem: Health Behavior/Discharge Planning: Goal: Ability to manage health-related needs will improve Outcome: Progressing   Problem: Clinical Measurements: Goal: Diagnostic test results will improve Outcome: Progressing Goal: Cardiovascular complication will be avoided Outcome: Progressing   Problem: Activity: Goal: Risk for activity intolerance will decrease Outcome: Progressing   Problem: Nutrition: Goal: Adequate nutrition will be maintained Outcome: Progressing   Problem: Elimination: Goal: Will not experience complications related to bowel motility Outcome: Progressing Goal: Will not experience complications related to urinary retention Outcome: Progressing   Problem: Pain Managment: Goal: General experience of comfort will improve Outcome: Progressing

## 2022-07-06 NOTE — Plan of Care (Signed)
  Problem: Education: Goal: Knowledge of General Education information will improve Description Including pain rating scale, medication(s)/side effects and non-pharmacologic comfort measures Outcome: Progressing   Problem: Health Behavior/Discharge Planning: Goal: Ability to manage health-related needs will improve Outcome: Progressing   

## 2022-07-06 NOTE — Progress Notes (Signed)
Progress Note   Patient: Jacob Harrison [id number] DOB: 05/09/1940 DOA: 07/05/2022     0 DOS: the patient was seen and examined on 07/06/2022   Brief hospital course:  82 y.o. 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  Patient reports worsening bilateral thigh pain and weakness over the past 2 months.  Has noted to be on statins for several years.  Has had worsening weakness and pain especially with ambulation.  No numbness or tingling.  Has also had some secondary back pain.  Patient denies any recent medication dosing change.  No reported recent infections.  No fevers or chills.  No reported rash.  No reported bowel or bladder incontinence.  Patient does admit to an episode where he was lifting a heavy bread maker approximately 2 months ago.  Symptoms immediately started after this point.  Was evaluated at his PCPs office within the past week for symptoms.  Was diagnosed with elevated CK level.  Patient is still been taking statin therapy.  No reported alcohol or tobacco use.  No chest pain or shortness of breath.  No nausea or vomiting.  No abdominal pain. Presented to the ER afebrile, hemodynamically stable.  White count 15.4, hemoglobin 13.4, troponin 32, creatinine 0.74, CK level 4400.  Noted CK level 3 days ago at 5800.  AST 308, ALT 146.  3/31 : Patient seen and examined this morning. Still complains of hide pains. Labs and imaging reviewed. CK trending down. Patient allergic to atorvastatin and rosuvastatin. Will discharge the patient on simvastatin. Will get lipid profile.    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 Will check Tylenol level, hepatitis panel Inflammatory markers pending Hold potential offending medications Follow  Leukocytosis  White count 15 on presentation Suspect likely reactive in setting of rhabdomyolysis Patient also fairly dry Will gently hydrate patient Monitor for now-trend with treatment Reassess if patient spikes a fever  BPH associated with nocturia Continue proscar   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   (HFpEF) heart failure with preserved ejection fraction ([place]) 2D ECHO 04/2022 w/ EF 30-35%  Euvolemic  Gentle IVF hydration in setting of rhabdomyolysis Cont home regimen  Strict Is and Os and daily weights    Coronary artery disease of native artery of native heart with stable angina pectoris (HCC) No active CP  Cont home regimen   Hyperlipidemia LDL goal <70 Holding statin in setting of rhabdo  Essential hypertension BP stable  Titrate home regimen   Type 2 diabetes mellitus without complication, without long-term current use of insulin (HCC) SSI A1C     Subjective: Patient seen and examined this morning.  Vital labs and imaging reviewed.  Patient vitally stable labs grossly normal except CK which is still high.  Patient complains of thigh pain.  Physical Exam: Vitals:  07/06/22 0000 07/06/22 0500 07/06/22 0750 07/06/22 1133  BP: (!) 93/55  (!) 112/58 120/82  Pulse: 62  61 66  Resp: 17  16 17   Temp: 98 F (36.7 C)  98 F (36.7 C) 98.1 F (36.7 C)  TempSrc:      SpO2: 97%  99% 98%  Weight:  75.7 kg    Height:         Data Reviewed:   Family Communication:  None by bedside  Disposition: Status is: Observation The patient remains OBS appropriate and will d/c before 2 midnights.  Planned Discharge Destination: Home    Time spent: 35 minutes  Author: Oran Rein, MD 07/06/2022 2:20 PM  For on call review [email or web address].

## 2022-07-06 NOTE — Progress Notes (Signed)
Mobility Specialist - Progress Note   07/06/22 0908  Mobility  Activity Ambulated with assistance to bathroom  Level of Assistance Standby assist, set-up cues, supervision of patient - no hands on  Assistive Device Front wheel walker  Distance Ambulated (ft) 12 ft  Activity Response Tolerated well  $Mobility charge 1 Mobility   Ms responding to bed alarm. Pt sitting EOB upon entry, requesting to use the bathroom. Pt STS to RW and amb SBA. Pt amb to the bathroom, left sitting on commode with call bell within reach.   [place] Mobility Specialist 07/06/22 9:10 AM

## 2022-07-07 DIAGNOSIS — I25118 Atherosclerotic heart disease of native coronary artery with other forms of angina pectoris: Secondary | ICD-10-CM | POA: Diagnosis not present

## 2022-07-07 DIAGNOSIS — I10 Essential (primary) hypertension: Secondary | ICD-10-CM | POA: Diagnosis not present

## 2022-07-07 DIAGNOSIS — M6282 Rhabdomyolysis: Secondary | ICD-10-CM | POA: Diagnosis not present

## 2022-07-07 LAB — LIPID PANEL
Cholesterol: 126 mg/dL (ref 0–200)
HDL: 37 mg/dL — ABNORMAL LOW (ref >40)
LDL Cholesterol: 65 mg/dL (ref 0–99)
Total CHOL/HDL Ratio: 3.4 RATIO
Triglycerides: 118 mg/dL (ref <150)
VLDL: 24 mg/dL (ref 0–40)

## 2022-07-07 LAB — COMPREHENSIVE METABOLIC PANEL
ALT: 96 U/L — ABNORMAL HIGH (ref 0–44)
AST: 187 U/L — ABNORMAL HIGH (ref 15–41)
Albumin: 2.9 g/dL — ABNORMAL LOW (ref 3.5–5.0)
Alkaline Phosphatase: 60 U/L (ref 38–126)
Anion gap: 7 (ref 5–15)
BUN: 10 mg/dL (ref 8–23)
CO2: 24 mmol/L (ref 22–32)
Calcium: 8.2 mg/dL — ABNORMAL LOW (ref 8.9–10.3)
Chloride: 101 mmol/L (ref 98–111)
Creatinine, Ser: 0.39 mg/dL — ABNORMAL LOW (ref 0.61–1.24)
GFR, Estimated: >60 mL/min (ref >60)
Glucose, Bld: 123 mg/dL — ABNORMAL HIGH (ref 70–99)
Potassium: 3.8 mmol/L (ref 3.5–5.1)
Sodium: 132 mmol/L — ABNORMAL LOW (ref 135–145)
Total Bilirubin: 0.8 mg/dL (ref 0.3–1.2)
Total Protein: 5 g/dL — ABNORMAL LOW (ref 6.5–8.1)

## 2022-07-07 LAB — CK: Total CK: 2030 U/L — ABNORMAL HIGH (ref 49–397)

## 2022-07-07 MED ORDER — KETOROLAC TROMETHAMINE 15 MG/ML IJ SOLN
15.0000 mg | Freq: Once | INTRAMUSCULAR | Status: AC
  Administered 2022-07-07: 15 mg via INTRAVENOUS
  Filled 2022-07-07: qty 1

## 2022-07-07 NOTE — Progress Notes (Signed)
. Progress Note   Patient: Jacob Harrison [id number] DOB: 16-Jan-1941 DOA: 07/05/2022     1 DOS: the patient was seen and examined on 07/07/2022   Brief hospital course:  82 y.o. 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  Patient reports worsening bilateral thigh pain and weakness over the past 2 months.  Has noted to be on statins for several years.  Has had worsening weakness and pain especially with ambulation.  No numbness or tingling.  Has also had some secondary back pain.  Patient denies any recent medication dosing change.  No reported recent infections.  No fevers or chills.  No reported rash.  No reported bowel or bladder incontinence.  Patient does admit to an episode where he was lifting a heavy bread maker approximately 2 months ago.  Symptoms immediately started after this point.  Was evaluated at his PCPs office within the past week for symptoms.  Was diagnosed with elevated CK level.  Patient is still been taking statin therapy.  No reported alcohol or tobacco use.  No chest pain or shortness of breath.  No nausea or vomiting.  No abdominal pain. Presented to the ER afebrile, hemodynamically stable.  White count 15.4, hemoglobin 13.4, troponin 32, creatinine 0.74, CK level 4400.  Noted CK level 3 days ago at 5800.  AST 308, ALT 146.  3/31 : Patient seen and examined this morning. Still complains of hide pains. Labs and imaging reviewed. CK trending down. Patient allergic to atorvastatin and rosuvastatin. Will discharge the patient on simvastatin. Will get lipid profile.   1/4 : Patient is symptomatic with quadricep pain.  Otherwise no complaint of generalized myalgia.  CK level trended down to 3040-2030.  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 Will check Tylenol level, hepatitis panel Inflammatory markers pending Hold potential offending medications Follow  Leukocytosis  White count 15 on presentation Suspect likely reactive in setting of rhabdomyolysis Patient also fairly dry Will gently hydrate patient Monitor for now-trend with treatment Reassess if patient spikes a fever  BPH associated with nocturia Continue proscar   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   (HFpEF) heart failure with preserved ejection fraction ([place]) 2D ECHO 04/2022 w/ EF 30-35%  Euvolemic  Gentle IVF hydration in setting of rhabdomyolysis Cont home regimen  Strict Is and Os and daily weights   Coronary artery disease of native artery of native heart with stable angina pectoris (HCC) No active CP  Cont home regimen   Hyperlipidemia LDL goal <70 Holding statin in setting of rhabdo  Essential hypertension BP stable  Titrate home regimen   Type 2 diabetes mellitus without complication, without long-term current use of insulin (HCC) SSI A1C     Subjective: Patient seen and examined this morning.  Vital  labs and imaging reviewed.  Patient vitally stable labs grossly normal except CK which is still high 2030.  Patient complains of thigh pain.  Physical Exam: Vitals:   07/06/22 2123 07/07/22 0013 07/07/22 0218 07/07/22 0814  BP: (!) 112/59 (!) 100/52  125/67  Pulse: (!) 57 (!) 58  66  Resp: 18 18  18   Temp: 98 F (36.7  C) 98.5 F (36.9 C)  97.9 F (36.6 C)  TempSrc: Oral   Oral  SpO2: 98% 98%  100%  Weight:   76.4 kg   Height:         Data Reviewed:   Family Communication: None by bedside  Disposition: Status is: Observation The patient remains OBS appropriate and will d/c before 2 midnights.  Planned Discharge Destination: Home    Time spent: 35 minutes  Author: Oran Rein, MD 07/07/2022 2:27 PM  For on call review [email or web address].

## 2022-07-08 DIAGNOSIS — I25118 Atherosclerotic heart disease of native coronary artery with other forms of angina pectoris: Secondary | ICD-10-CM | POA: Diagnosis not present

## 2022-07-08 DIAGNOSIS — N401 Enlarged prostate with lower urinary tract symptoms: Secondary | ICD-10-CM

## 2022-07-08 DIAGNOSIS — R351 Nocturia: Secondary | ICD-10-CM

## 2022-07-08 DIAGNOSIS — M6282 Rhabdomyolysis: Secondary | ICD-10-CM | POA: Diagnosis not present

## 2022-07-08 LAB — BASIC METABOLIC PANEL
Anion gap: 5 (ref 5–15)
BUN: 10 mg/dL (ref 8–23)
CO2: 24 mmol/L (ref 22–32)
Calcium: 8 mg/dL — ABNORMAL LOW (ref 8.9–10.3)
Chloride: 105 mmol/L (ref 98–111)
Creatinine, Ser: 0.46 mg/dL — ABNORMAL LOW (ref 0.61–1.24)
GFR, Estimated: >60 mL/min (ref >60)
Glucose, Bld: 106 mg/dL — ABNORMAL HIGH (ref 70–99)
Potassium: 3.6 mmol/L (ref 3.5–5.1)
Sodium: 134 mmol/L — ABNORMAL LOW (ref 135–145)

## 2022-07-08 LAB — CK: Total CK: 1539 U/L — ABNORMAL HIGH (ref 49–397)

## 2022-07-08 MED ORDER — KETOROLAC TROMETHAMINE 15 MG/ML IJ SOLN
15.0000 mg | Freq: Once | INTRAMUSCULAR | Status: AC
  Administered 2022-07-08: 15 mg via INTRAVENOUS
  Filled 2022-07-08: qty 1

## 2022-07-08 MED ORDER — ORAL CARE MOUTH RINSE: 15.0000 mL | OROMUCOSAL | Status: DC | PRN

## 2022-07-08 NOTE — Progress Notes (Signed)
.. Progress Note   Patient: Jacob Harrison [id number] DOB: 1940/08/03 DOA: 07/05/2022     2 DOS: the patient was seen and examined on 07/08/2022   Brief hospital course:  82 y.o. 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  Patient reports worsening bilateral thigh pain and weakness over the past 2 months.  Has noted to be on statins for several years.  Has had worsening weakness and pain especially with ambulation.  No numbness or tingling.  Has also had some secondary back pain.  Patient denies any recent medication dosing change.  No reported recent infections.  No fevers or chills.  No reported rash.  No reported bowel or bladder incontinence.  Patient does admit to an episode where he was lifting a heavy bread maker approximately 2 months ago.  Symptoms immediately started after this point.  Was evaluated at his PCPs office within the past week for symptoms.  Was diagnosed with elevated CK level.  Patient is still been taking statin therapy.  No reported alcohol or tobacco use.  No chest pain or shortness of breath.  No nausea or vomiting.  No abdominal pain. Presented to the ER afebrile, hemodynamically stable.  White count 15.4, hemoglobin 13.4, troponin 32, creatinine 0.74, CK level 4400.  Noted CK level 3 days ago at 5800.  AST 308, ALT 146.  3/31 : Patient seen and examined this morning. Still complains of hide pains. Labs and imaging reviewed. CK trending down. Patient allergic to atorvastatin and rosuvastatin. Will discharge the patient on simvastatin. Will get lipid profile.   1/4 : Patient is symptomatic with quadricep pain.  Otherwise no complaint of generalized myalgia.  CK level trended down to 3040-2030.  LDL cholesterol less than 70  2/4 : Patient's still symptomatic with thigh pain, CK 1539, will continue to  trend.  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 Will check Tylenol level, hepatitis panel Inflammatory markers pending Hold potential offending medications Follow  Leukocytosis :  White count 15 on presentation Suspect likely reactive in setting of rhabdomyolysis Patient also fairly dry Will gently hydrate patient Monitor for now-trend with treatment Reassess if patient spikes a fever  BPH associated with nocturia Continue proscar   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   (HFpEF) heart failure with preserved ejection fraction ([place]) 2D ECHO 04/2022 w/ EF 30-35%  Euvolemic  Gentle IVF hydration in setting of rhabdomyolysis Cont home regimen  Strict Is and Os and daily weights   Coronary artery disease of native artery of native heart with stable angina pectoris (HCC) No active CP  Cont home regimen   Hyperlipidemia LDL goal <70 Holding statin in setting of rhabdo  Essential hypertension BP stable  Titrate home regimen   Type 2 diabetes mellitus without complication, without long-term current use of insulin ([place])  SSI A1C     Subjective: Patient seen and examined this morning.  Vital labs and imaging reviewed.  Patient vitally stable labs grossly normal except CK which is still high 2030-1539   Physical Exam: Vitals:   07/08/22 0015 07/08/22 0735 07/08/22 1030 07/08/22 1225  BP: (!) 99/53 (!) 99/57  104/60  Pulse: (!)  56 (!) 58  (!) 58  Resp: 18 17    Temp: (!) 97.4 F (36.3 C) 97.6 F (36.4 C)    TempSrc:      SpO2: 98% 100% 100%   Weight:      Height:         Data Reviewed:   Family Communication: None by bedside  Disposition: Status is: Observation The patient remains OBS appropriate and will d/c before 2 midnights.  Planned Discharge Destination: Home    Time spent: 35 minutes  Author: Oran Rein, MD 07/08/2022 3:15 PM  For on call review [email or web address].

## 2022-07-08 NOTE — TOC CM/SW Note (Signed)
  Transition of Care [facility]) Screening Note   Patient Details  Name: Jacob Harrison Date of Birth: 06-Apr-1941   Transition of Care [facility]) CM/SW Contact:    Carol Ada, RN Phone Number: 07/08/2022, 3:13 PM  Transition of Care Department [facility]) has reviewed patient and no TOC needs have been identified at this time. We will continue to monitor patient advancement through interdisciplinary progression rounds. If new patient transition needs arise, please place a TOC consult.

## 2022-07-09 DIAGNOSIS — M6282 Rhabdomyolysis: Secondary | ICD-10-CM | POA: Diagnosis not present

## 2022-07-09 DIAGNOSIS — E785 Hyperlipidemia, unspecified: Secondary | ICD-10-CM

## 2022-07-09 DIAGNOSIS — I10 Essential (primary) hypertension: Secondary | ICD-10-CM | POA: Diagnosis not present

## 2022-07-09 LAB — CK: Total CK: 1159 U/L — ABNORMAL HIGH (ref 49–397)

## 2022-07-09 MED ORDER — KETOROLAC TROMETHAMINE 15 MG/ML IJ SOLN
15.0000 mg | Freq: Once | INTRAMUSCULAR | Status: AC
  Administered 2022-07-09: 15 mg via INTRAVENOUS
  Filled 2022-07-09: qty 1

## 2022-07-09 NOTE — Consult Note (Signed)
   [facility] CM Inpatient Consult   07/09/2022  Jacob Harrison 02-26-1941 [id number]  Orientation with Natividad Brood, [facility] Liaison for review.   Location: [facility] Liaison met with pt and his wife via bedside [facility]).   [place] [facility]) [place] Patient: Proofreader)    Primary Care Provider:  Velva Harman, PA  at [facility] Primary Care   Patient screened readmission hospitalization with noted low risk score for unplanned readmission risk with 1 IP in 6 months. Liaison will assess for potential [facility]) Care Management service needs for post hospital transition for care coordination.  Wife at bedside who was provided the appointment reminder card and 24 hours Nurse Advise Line magnet as instructed by the pt. NO inquires or questions concerning [facility] services.     Plan:  HIPAA verified and [facility] RN will continue to follow ongoing disposition to assess for post hospital community care coordination/management needs.  Referral request for community care coordination: pending disposition.   [place] does not replace or interfere with any arrangements made by the Inpatient Transition of Care team.   For questions contact:    Raina Mina, RN, [place] Hours M-F 8:00 am to 5 pm [phone] mobile [phone] [Office toll free line][facility] Office Hours are M-F 8:30 - 5 pm 24 hour nurse advise line [phone] Conceirge  [email or web address]

## 2022-07-09 NOTE — Care Management Important Message (Signed)
Important Message  Patient Details  Name: Rayveon Hodor MRN: [id number] Date of Birth: 1940-04-26   Medicare Important Message Given:  Yes  Patient asleep during time of visit, no family in room.  Copy of Medicare IM left on bedside tray for reference.   Dannette Barbara 07/09/2022, 11:14 AM

## 2022-07-09 NOTE — Progress Notes (Signed)
... Progress Note   Patient: Jacob Harrison [id number] DOB: 05/25/1940 DOA: 07/05/2022     3 DOS: the patient was seen and examined on 07/09/2022   Brief hospital course:  82 y.o. 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  Patient reports worsening bilateral thigh pain and weakness over the past 2 months.  Has noted to be on statins for several years.  Has had worsening weakness and pain especially with ambulation.  No numbness or tingling.  Has also had some secondary back pain.  Patient denies any recent medication dosing change.  No reported recent infections.  No fevers or chills.  No reported rash.  No reported bowel or bladder incontinence.  Patient does admit to an episode where he was lifting a heavy bread maker approximately 2 months ago.  Symptoms immediately started after this point.  Was evaluated at his PCPs office within the past week for symptoms.  Was diagnosed with elevated CK level.  Patient is still been taking statin therapy.  No reported alcohol or tobacco use.  No chest pain or shortness of breath.  No nausea or vomiting.  No abdominal pain. Presented to the ER afebrile, hemodynamically stable.  White count 15.4, hemoglobin 13.4, troponin 32, creatinine 0.74, CK level 4400.  Noted CK level 3 days ago at 5800.  AST 308, ALT 146.  3/31 : Patient seen and examined this morning. Still complains of hide pains. Labs and imaging reviewed. CK trending down. Patient allergic to atorvastatin and rosuvastatin. Will discharge the patient on simvastatin. Will get lipid profile.   1/4 : Patient is symptomatic with quadricep pain.  Otherwise no complaint of generalized myalgia.  CK level trended down to 3040-2030.  LDL cholesterol less than 70  4/2-4/3 : CK trending down we will continue to monitor today 1159 from  1539.  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 Will check Tylenol level, hepatitis panel Inflammatory markers pending Hold potential offending medications Follow  Leukocytosis :  White count 15 on presentation Suspect likely reactive in setting of rhabdomyolysis Patient also fairly dry Will gently hydrate patient Monitor for now-trend with treatment Reassess if patient spikes a fever  BPH associated with nocturia Continue proscar   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   (HFpEF) heart failure with preserved ejection fraction ([place]) 2D ECHO 04/2022 w/ EF 30-35%  Euvolemic  Gentle IVF hydration in setting of rhabdomyolysis Cont home regimen  Strict Is and Os and daily weights   Coronary artery disease of native artery of native heart with stable angina pectoris (HCC) No active CP  Cont home regimen   Hyperlipidemia LDL goal <70 Holding statin in setting of rhabdo  Essential hypertension BP stable  Titrate home regimen   Type 2 diabetes mellitus without complication, without long-term current use of insulin ([place])  SSI A1C     Subjective: Patient seen and examined this morning.  No overnight events.  Continues to complain of mild discomfort at the time.  Had infiltration of the left IV access.  Will get new x-rays on the right side.  CK trending down 2030-1539 -1159.  Physical Exam: Vitals:   07/08/22 1225 07/08/22 2337  07/09/22 0807 07/09/22 1539  BP: 104/60 (!) 103/57 (!) 105/58 103/60  Pulse: (!) 58 (!) 57 (!) 59 (!) 58  Resp:  20 16 16   Temp:  97.9 F (36.6 C) 98.2 F (36.8 C) 98.4 F (36.9 C)  TempSrc:      SpO2:  99% 100% 100%  Weight:      Height:       Physical Exam Constitutional:      Appearance: Normal appearance.  HENT:     Mouth/Throat:     Mouth: Mucous membranes are moist.  Eyes:     Pupils: Pupils are equal, round, and reactive to light.  Cardiovascular:     Rate and Rhythm: Normal rate and regular rhythm.     Pulses: Normal pulses.  Pulmonary:     Effort: Pulmonary effort is normal.  Abdominal:     General: Abdomen is flat.  Musculoskeletal:     Cervical back: Normal range of motion.     Comments: Left upper extremity with infiltration and pitting edema  Skin:    General: Skin is warm.  Neurological:     General: No focal deficit present.     Mental Status: He is alert.  Psychiatric:        Mood and Affect: Mood normal.      Family Communication: Wife updated.  Disposition: Status is: Observation The patient remains OBS appropriate and will d/c before 2 midnights.  Planned Discharge Destination: Home    Time spent: 35 minutes  Author: Oran Rein, MD 07/09/2022 4:22 PM  For on call review [email or web address].

## 2022-07-10 DIAGNOSIS — T466X5A Adverse effect of antihyperlipidemic and antiarteriosclerotic drugs, initial encounter: Secondary | ICD-10-CM | POA: Diagnosis not present

## 2022-07-10 DIAGNOSIS — M6282 Rhabdomyolysis: Secondary | ICD-10-CM | POA: Diagnosis not present

## 2022-07-10 LAB — HEPATIC FUNCTION PANEL
ALT: 65 U/L — ABNORMAL HIGH (ref 0–44)
AST: 112 U/L — ABNORMAL HIGH (ref 15–41)
Albumin: 2.5 g/dL — ABNORMAL LOW (ref 3.5–5.0)
Alkaline Phosphatase: 51 U/L (ref 38–126)
Bilirubin, Direct: 0.2 mg/dL (ref 0.0–0.2)
Indirect Bilirubin: 0.7 mg/dL (ref 0.3–0.9)
Total Bilirubin: 0.9 mg/dL (ref 0.3–1.2)
Total Protein: 4.5 g/dL — ABNORMAL LOW (ref 6.5–8.1)

## 2022-07-10 LAB — CK: Total CK: 1440 U/L — ABNORMAL HIGH (ref 49–397)

## 2022-07-10 LAB — BASIC METABOLIC PANEL
Anion gap: 5 (ref 5–15)
BUN: 9 mg/dL (ref 8–23)
CO2: 23 mmol/L (ref 22–32)
Calcium: 8 mg/dL — ABNORMAL LOW (ref 8.9–10.3)
Chloride: 108 mmol/L (ref 98–111)
Creatinine, Ser: 0.45 mg/dL — ABNORMAL LOW (ref 0.61–1.24)
GFR, Estimated: >60 mL/min (ref >60)
Glucose, Bld: 97 mg/dL (ref 70–99)
Potassium: 3.6 mmol/L (ref 3.5–5.1)
Sodium: 136 mmol/L (ref 135–145)

## 2022-07-10 LAB — CBC
HCT: 32.5 % — ABNORMAL LOW (ref 39.0–52.0)
Hemoglobin: 10.9 g/dL — ABNORMAL LOW (ref 13.0–17.0)
MCH: 32.4 pg (ref 26.0–34.0)
MCHC: 33.5 g/dL (ref 30.0–36.0)
MCV: 96.7 fL (ref 80.0–100.0)
Platelets: 213 10*3/uL (ref 150–400)
RBC: 3.36 MIL/uL — ABNORMAL LOW (ref 4.22–5.81)
RDW: 16.1 % — ABNORMAL HIGH (ref 11.5–15.5)
WBC: 11.3 10*3/uL — ABNORMAL HIGH (ref 4.0–10.5)
nRBC: 0 % (ref 0.0–0.2)

## 2022-07-10 MED ORDER — OXYCODONE HCL 5 MG PO TABS: 5.0000 mg | ORAL_TABLET | Freq: Four times a day (QID) | ORAL | 0 refills | Status: DC | PRN

## 2022-07-10 MED ORDER — OXYCODONE HCL 5 MG PO TABS
5.0000 mg | ORAL_TABLET | Freq: Four times a day (QID) | ORAL | Status: DC | PRN
  Administered 2022-07-10: 5 mg via ORAL
  Filled 2022-07-10: qty 1

## 2022-07-10 NOTE — Discharge Summary (Signed)
Physician Discharge Summary  Jacob Harrison [id number] DOB: 14-May-1940 DOA: 07/05/2022  PCP: Velva Harman, PA  Admit date: 07/05/2022 Discharge date: 07/10/2022  Admitted From: Home Disposition: Home  Recommendations for Outpatient Follow-up:  Follow up with PCP in 1 week. Please obtain BMP/CBC/CK in one week Continue to hold Zetia, statin.  Discontinue antifungal medicine.  Home Health: N/A Equipment/Devices: N/A  Discharge Condition: Stable CODE STATUS: Full code Diet recommendation: Low-salt diet  Discharge summary:  82 year old with a medical history of systolic congestive heart failure, BPH, coronary artery disease status post CABG, type 2 diabetes, hyperlipidemia, hypertension who was sent to the hospital with bilateral quadriceps pain, back pain and flank pain in the context of statin induced myopathy suspected.  Patient had injured his back 2 months ago and was following up as outpatient.  He went to dermatologist about 3 weeks ago and was prescribed terbinafine for onychomycosis.  Since then he started having severe bilateral quadriceps pain, difficult to get up from sitting position.  Seen at PCP office.  CK levels were 5800, AST 308, ALT 146 so sent to ER.  In the emergency room CK was 4400 with normal renal functions.   Statin induced rhabdomyolysis, aggravated by use of terbinafine. Previously reported myopathy to atorvastatin and currently on Crestor.  Severe debilitating symptoms, will add statin as allergy.  He will follow-up with cardiology office, he will benefit with Repatha injections. Patient was treated with gentle IV fluids, urine output is adequate.  He maintained renal functions. CK levels gradually trending down 5800-4400-1100-1400. Patient does have history of systolic heart failure, he has received adequate IV fluids for now.  Patient is able to take oral hydration and diet.  Discontinue IV fluids. Patient is able to go home today.   Discontinued all offending medication including statin and terbinafine. ESR was normal.  LDLs was slightly elevated.  Transaminases were slightly elevated but trending down. Please recheck CK levels in about 1 week to ensure stabilization. He will need alternative lipid-lowering drug, he will discuss this with cardiology or PCP.  Chronic medical issues including Ischemic cardiomyopathy status post AICD, stable Heart failure with reduced ejection fraction, stable euvolemic.  Can resume Lasix tomorrow at home. Coronary artery disease, stable Essential hypertension, stable   Patient is still has pain and difficulty with squatting, however this will take some time.  He was still taking a statin and terbinafine 1 day prior to admit.  He will gradually mobilize at home, focus on hydration. Patient was prescribed a short course of oxycodone to use due to significant pain. Stable for discharge with outpatient follow-up.  Discharge Diagnoses:  Principal Problem:   Rhabdomyolysis Active Problems:   Coronary artery disease of native heart with stable angina pectoris   Transaminitis   Type 2 diabetes mellitus without complication, without long-term current use of insulin   Essential hypertension   Hyperlipidemia LDL goal <70   Coronary artery disease of native artery of native heart with stable angina pectoris   (HFpEF) heart failure with preserved ejection fraction   Ischemic cardiomyopathy   BPH associated with nocturia   Leukocytosis    Discharge Instructions  Discharge Instructions     Diet - low sodium heart healthy   Complete by: As directed    Discharge instructions   Complete by: As directed    Start taking Furosemide tomorrow  Recheck CK levels in one week   Increase activity slowly   Complete by: As directed  Allergies as of 07/10/2022       Reactions   Statins Other (See Comments)   Rhabdo in combination with Terbinafine    Other Itching, Rash   Steroid  injection in 2021 caused rash/itching   Tetracyclines & Related Rash        Medication List     STOP taking these medications    Co Q-10 200 MG Caps   diclofenac Sodium 1 % Gel Commonly known as: VOLTAREN   ezetimibe 10 MG tablet Commonly known as: ZETIA   rosuvastatin 20 MG tablet Commonly known as: CRESTOR   terbinafine 250 MG tablet Commonly known as: LamISIL   traMADol 50 MG tablet Commonly known as: ULTRAM       TAKE these medications    acetaminophen 500 MG tablet Commonly known as: TYLENOL Take 1,000 mg by mouth every 6 (six) hours as needed for moderate pain or headache.   aspirin EC 81 MG tablet Take 1 tablet (81 mg total) by mouth daily.   carvedilol 12.5 MG tablet Commonly known as: COREG Take 0.5 tablets (6.25 mg total) by mouth 2 (two) times daily with a meal.   clopidogrel 75 MG tablet Commonly known as: PLAVIX TAKE 1 TABLET (75 MG TOTAL) BY MOUTH DAILY. **NO PLAVIX UNTIL 12/20** What changed: See the new instructions.   cyanocobalamin 500 MCG tablet Commonly known as: VITAMIN B12 Take 500 mcg by mouth daily.   DULoxetine 30 MG capsule Commonly known as: Cymbalta Take 2 capsules (60 mg total) by mouth at bedtime. Take 1 capsule at night for 7 days, then increase to 2 capsules nightly.   empagliflozin 10 MG Tabs tablet Commonly known as: Jardiance Take 1 tablet (10 mg total) by mouth daily before breakfast.   famotidine 20 MG tablet Commonly known as: Pepcid One after supper What changed:  how much to take how to take this when to take this   finasteride 5 MG tablet Commonly known as: PROSCAR TAKE 1 TABLET BY MOUTH EVERY DAY   furosemide 40 MG tablet Commonly known as: LASIX TAKE 1 TABLET BY MOUTH EVERY DAY   Glucosamine 1500 Complex Caps Take 1 capsule by mouth daily.   isosorbide mononitrate 30 MG 24 hr tablet Commonly known as: IMDUR Take 1 tablet (30 mg total) by mouth 2 (two) times daily.   losartan 25 MG  tablet Commonly known as: COZAAR Take 0.5 tablets (12.5 mg total) by mouth daily.   nitroGLYCERIN 0.4 MG SL tablet Commonly known as: NITROSTAT PLACE 1 TABLET UNDER THE TONGUE EVERY 5 MINUTES AS NEEDED.   onetouch ultrasoft lancets Use to check blood sugars fasting daily   oxyCODONE 5 MG immediate release tablet Commonly known as: Oxy IR/ROXICODONE Take 1 tablet (5 mg total) by mouth every 6 (six) hours as needed for up to 5 days for moderate pain.   pantoprazole 40 MG tablet Commonly known as: Protonix Take 1 tablet (40 mg total) by mouth daily. Take 30-60 min before first meal of the day   PRESERVISION AREDS 2 PO Take 1 capsule by mouth 2 (two) times a day.   ranolazine 1000 MG SR tablet Commonly known as: RANEXA TAKE 1 TABLET BY MOUTH TWICE A DAY What changed:  how much to take additional instructions   triamcinolone cream 0.1 % Commonly known as: KENALOG Apply 1 Application topically as directed. Qd up to 5 days per week to itchy rash on body until clear, avoid face, groin, axilla   VITAMIN D-3 PO Take  800 Units by mouth daily.        Allergies  Allergen Reactions   Statins Other (See Comments)    Rhabdo in combination with Terbinafine    Other Itching and Rash    Steroid injection in 2021 caused rash/itching   Tetracyclines & Related Rash    Consultations: None   Procedures/Studies: XR Lumbar Spine 2-3 Views  Result Date: 06/27/2022 AP lateral lumbar images and lateral flexion-extension images obtained and reviewed.  This shows previous decompression L3-4 L4-5.  Patient has progressive lumbar curvature now 15 degrees T12-L3 and L3-L5 with a right upper lumbar and left lower lumbar curvature.  Prominent lateral osteophytes and facet degenerative spurring is noted. Impression: Progressive lumbar curvature with facet arthritis.  CUP PACEART REMOTE DEVICE CHECK  Result Date: 06/19/2022 Scheduled remote reviewed. Normal device function.  Next remote in 91  days. Kathy Breach, RN, CCDS, [employer]  (Echo, Carotid, EGD, Colonoscopy, ERCP)    Subjective: Patient seen and examined in the morning rounds.  Still has pain on his anterior thighs.  First step to get up from the bed is very painful but he can walk around after he is up.  Discussed in detail about serological test, may take some time to normalize.  Patient is comfortable with the plan going home.  We discussed about medication management   Discharge Exam: Vitals:   07/10/22 0004 07/10/22 0748  BP: (!) 101/54 107/61  Pulse: 61 (!) 59  Resp: 20 15  Temp: 97.8 F (36.6 C) 98 F (36.7 C)  SpO2: 98% 98%   Vitals:   07/09/22 1539 07/10/22 0004 07/10/22 0500 07/10/22 0748  BP: 103/60 (!) 101/54  107/61  Pulse: (!) 58 61  (!) 59  Resp: 16 20  15   Temp: 98.4 F (36.9 C) 97.8 F (36.6 C)  98 F (36.7 C)  TempSrc:      SpO2: 100% 98%  98%  Weight:   78 kg   Height:        General: Pt is alert, awake, not in acute distress, eating breakfast. Cardiovascular: RRR, S1/S2 +, no rubs, no gallops, AICD in place. Respiratory: CTA bilaterally, no wheezing, no rhonchi Abdominal: Soft, NT, ND, bowel sounds + Extremities: no edema, no cyanosis No palpable muscle tenderness.  All joint movements are normal.  Joints are with full range of motion.  No rashes.    The results of significant diagnostics from this hospitalization (including imaging, microbiology, ancillary and laboratory) are listed below for reference.     Microbiology: No results found for this or any previous visit (from the past 240 hour(s)).   Labs: BNP (last 3 results) Recent Labs    05/22/22 1043  BNP [id number]*   Basic Metabolic Panel: Recent Labs  Lab 07/05/22 1511 07/06/22 0454 07/07/22 1041 07/08/22 0514 07/10/22 0505  NA 130* 131* 132* 134* 136  K 4.3 3.6 3.8 3.6 3.6  CL 96* 100 101 105 108  CO2 29 24 24 24 23   GLUCOSE 146* 89 123* 106* 97  BUN 23 16 10 10 9   CREATININE 0.74 0.52* 0.39*  0.46* 0.45*  CALCIUM 8.6* 8.2* 8.2* 8.0* 8.0*   Liver Function Tests: Recent Labs  Lab 07/05/22 1511 07/06/22 0454 07/07/22 1041 07/10/22 0505  AST 308* 236* 187* 112*  ALT 146* 114* 96* 65*  ALKPHOS 75 60 60 51  BILITOT 1.1 1.1 0.8 0.9  PROT 6.1* 5.2* 5.0* 4.5*  ALBUMIN 3.5 3.0* 2.9* 2.5*   No  results for input(s): "LIPASE", "AMYLASE" in the last 168 hours. No results for input(s): "AMMONIA" in the last 168 hours. CBC: Recent Labs  Lab 07/05/22 1511 07/06/22 0454 07/10/22 0505  WBC 15.4* 11.2* 11.3*  HGB 13.4 11.7* 10.9*  HCT 39.2 33.5* 32.5*  MCV 94.7 93.6 96.7  PLT 224 212 213   Cardiac Enzymes: Recent Labs  Lab 07/06/22 0454 07/07/22 1041 07/08/22 0514 07/09/22 1140 07/10/22 0505  CKTOTAL 3,040* 2,030* 1,539* 1,159* 1,440*   BNP: Invalid input(s): "POCBNP" CBG: No results for input(s): "GLUCAP" in the last 168 hours. D-Dimer No results for input(s): "DDIMER" in the last 72 hours. Hgb A1c No results for input(s): "HGBA1C" in the last 72 hours. Lipid Profile Recent Labs    07/07/22 1041  CHOL 126  HDL 37*  LDLCALC 65  TRIG 118  CHOLHDL 3.4   Thyroid function studies No results for input(s): "TSH", "T4TOTAL", "T3FREE", "THYROIDAB" in the last 72 hours.  Invalid input(s): "FREET3" Anemia work up No results for input(s): "VITAMINB12", "FOLATE", "FERRITIN", "TIBC", "IRON", "RETICCTPCT" in the last 72 hours. Urinalysis    Component Value Date/Time   COLORURINE YELLOW (A) 07/05/2022 1705   APPEARANCEUR CLEAR (A) 07/05/2022 1705   LABSPEC 1.023 07/05/2022 1705   PHURINE 5.0 07/05/2022 1705   GLUCOSEU >=500 (A) 07/05/2022 1705   HGBUR NEGATIVE 07/05/2022 1705   BILIRUBINUR NEGATIVE 07/05/2022 1705   KETONESUR NEGATIVE 07/05/2022 1705   PROTEINUR NEGATIVE 07/05/2022 1705   NITRITE NEGATIVE 07/05/2022 1705   LEUKOCYTESUR NEGATIVE 07/05/2022 1705   Sepsis Labs Recent Labs  Lab 07/05/22 1511 07/06/22 0454 07/10/22 0505  WBC 15.4* 11.2* 11.3*    Microbiology No results found for this or any previous visit (from the past 240 hour(s)).   Time coordinating discharge: 35 minutes  SIGNED:   Barb Merino, MD  [facility] 07/10/2022, 9:49 AM

## 2022-07-10 NOTE — Plan of Care (Signed)

## 2022-07-13 ENCOUNTER — Other Ambulatory Visit: Payer: Self-pay | Admitting: Medical

## 2022-07-14 ENCOUNTER — Encounter: Payer: Self-pay | Admitting: Family Medicine

## 2022-07-14 ENCOUNTER — Encounter: Payer: Self-pay | Admitting: Internal Medicine

## 2022-07-14 DIAGNOSIS — Z09 Encounter for follow-up examination after completed treatment for conditions other than malignant neoplasm: Secondary | ICD-10-CM

## 2022-07-15 NOTE — Telephone Encounter (Signed)
Pt made aware of MD's recommendations and verbalized understanding Pt has an appointment scheduled on 4/30 with APP

## 2022-07-15 NOTE — Telephone Encounter (Signed)
Please have Jacob Harrison increase his furosemide to 40 mg PO BID until his weight returns to baseline and his swelling has resolved.  He can return to 40 mg daily at that time.  Please have him follow-up with me or an APP at his earliest convenience to reassess his symptoms and also discuss lipid therapy, given recent discontinuation of rosuvastatin in the setting of rhabdomyolysis.  [name], MD [facility]

## 2022-07-17 ENCOUNTER — Other Ambulatory Visit: Payer: Self-pay | Admitting: Medical

## 2022-07-17 ENCOUNTER — Other Ambulatory Visit: Payer: Self-pay | Admitting: Internal Medicine

## 2022-07-17 ENCOUNTER — Ambulatory Visit: Payer: Medicare HMO | Admitting: Cardiology

## 2022-07-17 DIAGNOSIS — I25118 Atherosclerotic heart disease of native coronary artery with other forms of angina pectoris: Secondary | ICD-10-CM

## 2022-07-21 ENCOUNTER — Other Ambulatory Visit: Payer: Medicare HMO

## 2022-07-21 ENCOUNTER — Encounter: Payer: Self-pay | Admitting: Family Medicine

## 2022-07-21 DIAGNOSIS — Z09 Encounter for follow-up examination after completed treatment for conditions other than malignant neoplasm: Secondary | ICD-10-CM

## 2022-07-23 ENCOUNTER — Encounter: Payer: Self-pay | Admitting: Family Medicine

## 2022-07-23 NOTE — Progress Notes (Signed)
Remote ICD transmission.   

## 2022-07-24 ENCOUNTER — Encounter: Payer: Self-pay | Admitting: Family Medicine

## 2022-07-24 ENCOUNTER — Ambulatory Visit (INDEPENDENT_AMBULATORY_CARE_PROVIDER_SITE_OTHER): Payer: Medicare HMO | Admitting: Family Medicine

## 2022-07-24 VITALS — BP 99/65 | HR 81 | Resp 16 | Ht 67.0 in | Wt 176.0 lb

## 2022-07-24 DIAGNOSIS — M6282 Rhabdomyolysis: Secondary | ICD-10-CM

## 2022-07-24 DIAGNOSIS — I5032 Chronic diastolic (congestive) heart failure: Secondary | ICD-10-CM

## 2022-07-24 DIAGNOSIS — Z09 Encounter for follow-up examination after completed treatment for conditions other than malignant neoplasm: Secondary | ICD-10-CM

## 2022-07-24 DIAGNOSIS — I10 Essential (primary) hypertension: Secondary | ICD-10-CM

## 2022-07-24 MED ORDER — OXYCODONE HCL 5 MG PO TABS
5.0000 mg | ORAL_TABLET | Freq: Four times a day (QID) | ORAL | 0 refills | Status: AC | PRN
Start: 2022-07-24 — End: 2022-07-29

## 2022-07-24 MED ORDER — FUROSEMIDE 40 MG PO TABS: 40.0000 mg | ORAL_TABLET | Freq: Every day | ORAL | 0 refills | Status: DC

## 2022-07-24 NOTE — Progress Notes (Signed)
Established Patient Office Visit  Subjective   Patient ID: Jacob Harrison, male    DOB: Aug 17, 1940  Age: 82 y.o. MRN: [id number]  Chief Complaint  Patient presents with   Hospitalization Follow-up    HPI Jacob Harrison is a 82 y.o. male presenting today for follow up of hospitalization 07/05/2022 through 07/10/2022 for statin induced rhabdomyolysis aggravated by use of terbinafine.  Statin, Zetia, terbinafine stopped.  Hospitalist recommended Repatha injections for cholesterol management.  Discharged home with instructions for maintaining adequate oral hydration and diet.  He has been drinking a lot of water at home as instructed.  He also was able to talk to his cardiologist on the phone and they recommended increasing his Lasix to twice daily.  His swelling has continued to go down but is still significant.  He also endorses pain with movement in his shoulders, arms, and quads.  He reports feeling much better compared to when he initially left the hospital.  He has also gotten to the point where his urinary output is more than his fluid intake.  He is planning to schedule a hospital follow-up with a cardiologist in the next couple of days.  He has finished his prescription of oxycodone, he was taking half to 1 tablet before bed to help him to be able to sleep.  Patient denies chest pain but does endorse mild shortness of breath with a lot of activity.  ROS Negative unless otherwise noted in HPI   Objective:     BP 99/65 (BP Location: Right Arm, Patient Position: Sitting, Cuff Size: Normal)   Pulse 81   Resp 16   Ht  (1.702 m)   Wt 176 lb (79.8 kg)   SpO2 94%   BMI 27.57 kg/m   Physical Exam Constitutional:      General: He is not in acute distress.    Appearance: Normal appearance.  HENT:     Head: Normocephalic and atraumatic.  Cardiovascular:     Rate and Rhythm: Normal rate and regular rhythm.     Pulses: Normal pulses.     Heart sounds: No murmur  heard.    No friction rub. No gallop.  Pulmonary:     Effort: Pulmonary effort is normal. No respiratory distress.     Breath sounds: Normal breath sounds. No wheezing, rhonchi or rales.  Musculoskeletal:        General: Swelling present.     Right lower leg: Edema present.     Left lower leg: Edema present.     Comments: Limited range of motion in shoulders.  2+ to 3+ pitting edema bilaterally in lower legs, ankles, and feet.  3+ pitting edema in left arm and hand with mild tenderness to palpation.  Nonpitting edema in right arm and hand.  Skin:    General: Skin is warm and dry.  Neurological:     Mental Status: He is alert and oriented to person, place, and time.  Psychiatric:        Mood and Affect: Mood normal.     Assessment & Plan:  Hospital discharge follow-up  Non-traumatic rhabdomyolysis -     CBC with Differential/Platelet; Future -     Comprehensive metabolic panel; Future -     CK -     oxyCODONE HCl; Take 1 tablet (5 mg total) by mouth every 6 (six) hours as needed for up to 5 days for moderate pain.  Dispense: 15 tablet; Refill: 0  Essential hypertension -  Furosemide; Take 1 tablet (40 mg total) by mouth daily.  Dispense: 90 tablet; Refill: 0  Chronic heart failure with preserved ejection fraction -     Furosemide; Take 1 tablet (40 mg total) by mouth daily.  Dispense: 90 tablet; Refill: 0  Hospital follow-up for rhabdomyolysis Recommended to continue Lasix 40 mg twice a day as recommended by cardiology to continue reducing peripheral edema.  Continue oral fluid intake to continue to decrease CK levels.  Ordered repeat CBC, CMP, CK and will attempt blood draw today.  Attempts the past several days have been unsuccessful because he is too swollen.  If unable to draw today, he will return on 07/28/2022 and hopefully edema will have decreased enough then.  Sending note to cardiologist to be aware that he is in need of hospital follow-up appointment with them.   Patient plans to call the office to get scheduled with them as soon as possible.  He also wants to discuss Repatha injections with them for cholesterol management, but I did let him know that it can be managed with primary care as well.  He would like to hear what the plan is regarding his blood pressure and heart failure before discussing cholesterol medication.   Return in 26 days (on 08/19/2022) for follow-up for HTN, diabetes, hyperlipidemia (already scheduled).   I spent 30 minutes on the day of the encounter to include pre-visit record review, face-to-face time with the patient, and post visit ordering of tests.  [name], PA

## 2022-07-24 NOTE — Patient Instructions (Addendum)
Call your cardiologist to try to get in with them in the next couple of days for hospital follow-up.  Let me know what you discussed regarding starting Repatha for your cholesterol.  If you are unable to get labs drawn today, please make a lab appointment for Monday or Tuesday.  Continue taking your Lasix twice a day as instructed and maintaining adequate fluid hydration.  I am hoping that as the swelling goes down and you are urinating less frequently the weak stream will resolve.  If it does not, you can let me know at your appointment in about a month and we can do what ever workup that we need.

## 2022-07-25 ENCOUNTER — Ambulatory Visit: Payer: Medicare HMO | Admitting: Internal Medicine

## 2022-07-25 ENCOUNTER — Encounter: Payer: Self-pay | Admitting: Internal Medicine

## 2022-07-28 DIAGNOSIS — H5203 Hypermetropia, bilateral: Secondary | ICD-10-CM | POA: Diagnosis not present

## 2022-07-28 DIAGNOSIS — E119 Type 2 diabetes mellitus without complications: Secondary | ICD-10-CM | POA: Diagnosis not present

## 2022-07-28 DIAGNOSIS — H2513 Age-related nuclear cataract, bilateral: Secondary | ICD-10-CM | POA: Diagnosis not present

## 2022-07-28 DIAGNOSIS — H35371 Puckering of macula, right eye: Secondary | ICD-10-CM | POA: Diagnosis not present

## 2022-07-28 LAB — HM DIABETES EYE EXAM

## 2022-07-29 ENCOUNTER — Encounter: Payer: Self-pay | Admitting: Family Medicine

## 2022-07-29 ENCOUNTER — Telehealth: Payer: Self-pay

## 2022-07-29 NOTE — Telephone Encounter (Signed)
Contacted Josiel Macario Carls to schedule their annual wellness visit. Appointment made for 07/31/22.  Agnes Lawrence, CMA (AAMA)  [facility] [phone]

## 2022-07-30 ENCOUNTER — Other Ambulatory Visit: Payer: Medicare HMO

## 2022-07-30 DIAGNOSIS — Z09 Encounter for follow-up examination after completed treatment for conditions other than malignant neoplasm: Secondary | ICD-10-CM | POA: Diagnosis not present

## 2022-07-30 DIAGNOSIS — E559 Vitamin D deficiency, unspecified: Secondary | ICD-10-CM | POA: Diagnosis not present

## 2022-07-30 DIAGNOSIS — E119 Type 2 diabetes mellitus without complications: Secondary | ICD-10-CM | POA: Diagnosis not present

## 2022-07-31 ENCOUNTER — Ambulatory Visit (INDEPENDENT_AMBULATORY_CARE_PROVIDER_SITE_OTHER): Payer: Medicare HMO

## 2022-07-31 ENCOUNTER — Ambulatory Visit: Payer: Medicare HMO | Attending: Internal Medicine | Admitting: Internal Medicine

## 2022-07-31 ENCOUNTER — Encounter: Payer: Self-pay | Admitting: Internal Medicine

## 2022-07-31 VITALS — BP 90/54 | HR 74 | Ht 67.0 in | Wt 171.2 lb

## 2022-07-31 VITALS — Ht 67.0 in | Wt 162.0 lb

## 2022-07-31 DIAGNOSIS — M6282 Rhabdomyolysis: Secondary | ICD-10-CM | POA: Diagnosis not present

## 2022-07-31 DIAGNOSIS — Z79899 Other long term (current) drug therapy: Secondary | ICD-10-CM

## 2022-07-31 DIAGNOSIS — Z0000 Encounter for general adult medical examination without abnormal findings: Secondary | ICD-10-CM

## 2022-07-31 DIAGNOSIS — I255 Ischemic cardiomyopathy: Secondary | ICD-10-CM | POA: Diagnosis not present

## 2022-07-31 DIAGNOSIS — I5022 Chronic systolic (congestive) heart failure: Secondary | ICD-10-CM | POA: Diagnosis not present

## 2022-07-31 DIAGNOSIS — E785 Hyperlipidemia, unspecified: Secondary | ICD-10-CM

## 2022-07-31 DIAGNOSIS — E1169 Type 2 diabetes mellitus with other specified complication: Secondary | ICD-10-CM

## 2022-07-31 DIAGNOSIS — I10 Essential (primary) hypertension: Secondary | ICD-10-CM

## 2022-07-31 DIAGNOSIS — I5032 Chronic diastolic (congestive) heart failure: Secondary | ICD-10-CM

## 2022-07-31 LAB — COMPREHENSIVE METABOLIC PANEL
ALT: 19 IU/L (ref 0–44)
AST: 23 IU/L (ref 0–40)
Albumin/Globulin Ratio: 1.9 (ref 1.2–2.2)
Albumin: 4.1 g/dL (ref 3.8–4.8)
Alkaline Phosphatase: 95 IU/L (ref 44–121)
BUN/Creatinine Ratio: 16 (ref 10–24)
BUN: 14 mg/dL (ref 8–27)
Bilirubin Total: 0.7 mg/dL (ref 0.0–1.2)
CO2: 23 mmol/L (ref 20–29)
Calcium: 9.2 mg/dL (ref 8.6–10.2)
Chloride: 94 mmol/L — ABNORMAL LOW (ref 96–106)
Creatinine, Ser: 0.85 mg/dL (ref 0.76–1.27)
Globulin, Total: 2.2 g/dL (ref 1.5–4.5)
Glucose: 91 mg/dL (ref 70–99)
Potassium: 4.4 mmol/L (ref 3.5–5.2)
Sodium: 136 mmol/L (ref 134–144)
Total Protein: 6.3 g/dL (ref 6.0–8.5)
eGFR: 88 mL/min/{1.73_m2} (ref >59)

## 2022-07-31 LAB — CBC WITH DIFFERENTIAL/PLATELET
Basophils Absolute: 0.1 10*3/uL (ref 0.0–0.2)
Basos: 1 %
EOS (ABSOLUTE): 0.2 10*3/uL (ref 0.0–0.4)
Eos: 3 %
Hematocrit: 35.3 % — ABNORMAL LOW (ref 37.5–51.0)
Hemoglobin: 12 g/dL — ABNORMAL LOW (ref 13.0–17.7)
Immature Grans (Abs): 0 10*3/uL (ref 0.0–0.1)
Immature Granulocytes: 0 %
Lymphocytes Absolute: 1.6 10*3/uL (ref 0.7–3.1)
Lymphs: 19 %
MCH: 32.9 pg (ref 26.6–33.0)
MCHC: 34 g/dL (ref 31.5–35.7)
MCV: 97 fL (ref 79–97)
Monocytes Absolute: 1 10*3/uL — ABNORMAL HIGH (ref 0.1–0.9)
Monocytes: 13 %
Neutrophils Absolute: 5.3 10*3/uL (ref 1.4–7.0)
Neutrophils: 64 %
Platelets: 341 10*3/uL (ref 150–450)
RBC: 3.65 x10E6/uL — ABNORMAL LOW (ref 4.14–5.80)
RDW: 13.8 % (ref 11.6–15.4)
WBC: 8.2 10*3/uL (ref 3.4–10.8)

## 2022-07-31 LAB — CK: Total CK: 54 U/L (ref 41–331)

## 2022-07-31 MED ORDER — FUROSEMIDE 40 MG PO TABS
ORAL_TABLET | ORAL | 3 refills | Status: DC
Start: 2022-07-31 — End: 2022-07-31

## 2022-07-31 MED ORDER — FUROSEMIDE 40 MG PO TABS
ORAL_TABLET | ORAL | 3 refills | Status: DC
Start: 2022-07-31 — End: 2023-04-15

## 2022-07-31 NOTE — Progress Notes (Signed)
Subjective:   Jacob Harrison is a 82 y.o. male who presents for Medicare Annual/Subsequent preventive examination.  Review of Systems    Virtual Visit via Telephone Note  I connected with  Jacob Harrison on 07/31/22 at 10:00 AM EDT by telephone and verified that I am speaking with the correct person using two identifiers.  Location: Patient: Home Provider: Office Persons participating in the virtual visit: patient/Nurse Health Advisor   I discussed the limitations, risks, security and privacy concerns of performing an evaluation and management service by telephone and the availability of in person appointments. The patient expressed understanding and agreed to proceed.  Interactive audio and video telecommunications were attempted between this nurse and patient, however failed, due to patient having technical difficulties OR patient did not have access to video capability.  We continued and completed visit with audio only.  Some vital signs may be absent or patient reported.   Tillie Rung, LPN  Cardiac Risk Factors include: advanced age (>4men, >88 women);male gender;diabetes mellitus;hypertension     Objective:    Today's Vitals   07/31/22 0953 07/31/22 0956  Weight: 162 lb (73.5 kg)   Height: 5\' 7"  (1.702 m)   PainSc:  0-No pain   Body mass index is 25.37 kg/m.     07/31/2022   10:09 AM 07/05/2022    7:00 PM 07/05/2022    3:11 PM 05/27/2022    9:57 AM 11/11/2021   12:10 PM 03/20/2021    7:13 AM 11/02/2020   10:08 AM  Advanced Directives  Does Patient Have a Medical Advance Directive? Yes Yes Yes Yes Yes Yes Yes  Type of Estate agent of [place];Living will  Healthcare Power of [place];Living will Healthcare Power of [place];Living will Healthcare Power of [place];Living will Healthcare Power of [place];Living will Healthcare Power of [place];Living will  Does patient want to make changes to medical advance directive?  No -  Patient declined    No - Guardian declined   Copy of Healthcare Power of Attorney in Chart? No - copy requested     No - copy requested     Current Medications (verified) Outpatient Encounter Medications as of 07/31/2022  Medication Sig   acetaminophen (TYLENOL) 500 MG tablet Take 1,000 mg by mouth every 6 (six) hours as needed for moderate pain or headache.   aspirin EC 81 MG tablet Take 1 tablet (81 mg total) by mouth daily.   carvedilol (COREG) 12.5 MG tablet Take 0.5 tablets (6.25 mg total) by mouth 2 (two) times daily with a meal.   Cholecalciferol (VITAMIN D-3 PO) Take 800 Units by mouth daily.   clopidogrel (PLAVIX) 75 MG tablet TAKE 1 TABLET (75 MG TOTAL) BY MOUTH DAILY. **NO PLAVIX UNTIL 12/20**   cyanocobalamin 500 MCG tablet Take 500 mcg by mouth daily.   DULoxetine (CYMBALTA) 30 MG capsule Take 2 capsules (60 mg total) by mouth at bedtime. Take 1 capsule at night for 7 days, then increase to 2 capsules nightly.   empagliflozin (JARDIANCE) 10 MG TABS tablet Take 1 tablet (10 mg total) by mouth daily before breakfast.   famotidine (PEPCID) 20 MG tablet One after supper (Patient taking differently: Take 20 mg by mouth every evening. One after supper)   finasteride (PROSCAR) 5 MG tablet TAKE 1 TABLET BY MOUTH EVERY DAY (Patient taking differently: Take 5 mg by mouth daily.)   furosemide (LASIX) 40 MG tablet Take 1 tablet (40 mg total) by mouth daily.   Glucosamine-Chondroit-Vit C-Mn (  GLUCOSAMINE 1500 COMPLEX) CAPS Take 1 capsule by mouth daily.   isosorbide mononitrate (IMDUR) 30 MG 24 hr tablet Take 1 tablet (30 mg total) by mouth 2 (two) times daily.   Lancets (ONETOUCH ULTRASOFT) lancets Use to check blood sugars fasting daily   losartan (COZAAR) 25 MG tablet TAKE 1 TABLET (25 MG TOTAL) BY MOUTH DAILY.   Multiple Vitamins-Minerals (PRESERVISION AREDS 2 PO) Take 1 capsule by mouth 2 (two) times a day.   nitroGLYCERIN (NITROSTAT) 0.4 MG SL tablet PLACE 1 TABLET UNDER THE TONGUE EVERY 5  MINUTES AS NEEDED.   pantoprazole (PROTONIX) 40 MG tablet Take 1 tablet (40 mg total) by mouth daily. Take 30-60 min before first meal of the day   ranolazine (RANEXA) 1000 MG SR tablet TAKE 1 TABLET BY MOUTH TWICE A DAY   triamcinolone cream (KENALOG) 0.1 % Apply 1 Application topically as directed. Qd up to 5 days per week to itchy rash on body until clear, avoid face, groin, axilla   No facility-administered encounter medications on file as of 07/31/2022.    Allergies (verified) Statins, Other, and Tetracyclines & related   History: Past Medical History:  Diagnosis Date   (HFpEF) heart failure with preserved ejection fraction    a. 10/2016 Echo: EF 65-70%, mild LVH, mildly dil LA. RV fxn nl.   Arthritis    BPH (benign prostatic hyperplasia)    Coronary artery disease    a. 2001 CABG x 3 [facility]): LIMA->LAD, VG->LCX, VG->RPDA; b. 04/05/2010 Cath ([place]): LM 60m, LAD 140m, LCX 56m, RCA 100p, LIMA->LAD atretic, VG->LCX 100, VG->RCA 80ost, 70d; c. 04/2010 Redo CABG x 2 ([place]): VG->LAD, VG->RPDA; d. 10/2012 Cath [facility]): LM 20-30, LAD 100/95-48m, LCX 40-64m, RCA 100p, 80-100d, RPDA 99, RPL1 95, VG->LAD ok, VG->RPDA ok, EF 67%; e. 11/2016 MV: EF 48%, No ischemia.   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 03/2021 s/p MDT Visia AF MRI VR SureScan single lead AICD (ser# [id number]).   Scoliosis    Spinal stenosis    Past Surgical History:  Procedure Laterality Date   CARDIAC CATHETERIZATION     CORONARY ARTERY BYPASS GRAFT     x 2 (2001 and redo in late 2011 or early 2012)   HERNIA REPAIR Left    inguinal   ICD IMPLANT N/A 03/20/2021   Procedure: ICD IMPLANT;  Surgeon: [name], MD;  Location: [facility] INVASIVE CV LAB;  Service: Cardiovascular;  Laterality: N/A;   LEFT HEART CATH AND CORS/GRAFTS ANGIOGRAPHY N/A 10/04/2020   Procedure: LEFT HEART CATH AND CORS/GRAFTS ANGIOGRAPHY;  Surgeon: [name], MD;  Location: [facility] INVASIVE CV LAB;  Service: Cardiovascular;  Laterality: N/A;   LUMBAR LAMINECTOMY/DECOMPRESSION MICRODISCECTOMY N/A 10/20/2018   Procedure: L3-4, L4-5 LUMBAR DECOMPRESSION with dural repair.;  Surgeon: [name], MD;  Location: [facility] OR;  Service: Orthopedics;  Laterality: N/A;   RIGHT/LEFT HEART CATH AND CORONARY/GRAFT ANGIOGRAPHY N/A 05/27/2022   Procedure: RIGHT/LEFT HEART CATH AND CORONARY/GRAFT ANGIOGRAPHY;  Surgeon: [name], MD;  Location: [facility] INVASIVE CV LAB;  Service: Cardiovascular;  Laterality: N/A;   TONSILLECTOMY AND ADENOIDECTOMY     TRANSURETHRAL RESECTION OF PROSTATE  1990   Family History  Problem Relation Age of Onset   Diabetes Mother    Hyperlipidemia Mother    Cancer Father        colon   Skin cancer Brother    Alcohol abuse  Maternal Uncle    Diabetes Maternal Uncle    Hyperlipidemia Maternal Uncle    Cancer Paternal Uncle        colon   Social History   Socioeconomic History   Marital status: Married    Spouse name: Glenda   Number of children: 2   Years of education: Not on file   Highest education level: Associate degree: occupational, Scientist, product/process development, or vocational program  Occupational History   Occupation: retired  Tobacco Use   Smoking status: Never    Passive exposure: Never   Smokeless tobacco: Never  Vaping Use   Vaping Use: Never used  Substance and Sexual Activity   Alcohol use: Not Currently    Comment: 1-2 drinks on occasion previously 5-7 drinks a week (was 10-15 drinks/week)   Drug use: No   Sexual activity: Not Currently    Birth control/protection: None  Other Topics Concern   Not on file  Social History Narrative   Not on file   Social Determinants of Health   Financial Resource Strain: Low Risk  (07/31/2022)   Overall Financial Resource Strain (CARDIA)    Difficulty of Paying Living Expenses: Not hard at all  Food Insecurity: No Food Insecurity (07/31/2022)   Hunger Vital Sign    Worried About  Running Out of Food in the Last Year: Never true    Ran Out of Food in the Last Year: Never true  Transportation Needs: No Transportation Needs (07/31/2022)   PRAPARE - Administrator, Civil Service (Medical): No    Lack of Transportation (Non-Medical): No  Physical Activity: Insufficiently Active (07/31/2022)   Exercise Vital Sign    Days of Exercise per Week: 7 days    Minutes of Exercise per Session: 10 min  Stress: No Stress Concern Present (07/31/2022)   [employer] of Occupational Health - Occupational Stress Questionnaire    Feeling of Stress : Not at all  Social Connections: Socially Integrated (07/31/2022)   Social Connection and Isolation Panel [NHANES]    Frequency of Communication with Friends and Family: More than three times a week    Frequency of Social Gatherings with Friends and Family: More than three times a week    Attends Religious Services: More than 4 times per year    Active Member of [employer] or Organizations: Yes    Attends Engineer, structural: More than 4 times per year    Marital Status: Married    Tobacco Counseling Counseling given: Not Answered   Clinical Intake:  Pre-visit preparation completed: Yes Nutrition Risk Assessment:  Has the patient had any N/V/D within the last 2 months?  No  Does the patient have any non-healing wounds?  No  Has the patient had any unintentional weight loss or weight gain?  No   Diabetes:  Is the patient diabetic?  Yes  If diabetic, was a CBG obtained today?  Yes CBG 100 Taken by patient Did the patient bring in their glucometer from home?  No  How often do you monitor your CBG's? Every other day.   Financial Strains and Diabetes Management:  Are you having any financial strains with the device, your supplies or your medication? No .  Does the patient want to be seen by Chronic Care Management for management of their diabetes?  No  Would the patient like to be referred to a Nutritionist or  for Diabetic Management?  No   Diabetic Exams:  Diabetic Eye Exam: Completed . Overdue for  diabetic eye exam. Pt has been advised about the importance in completing this exam. A referral has been placed today. Message sent to referral coordinator for scheduling purposes. Advised pt to expect a call from office referred to regarding appt.  Diabetic Foot Exam: Completed . Pt has been advised about the importance in completing this exam. Pt is scheduled for diabetic foot exam on Followed by PCP.   Pain : No/denies pain Pain Score: 0-No pain     BMI - recorded: 25.37 Nutritional Status: BMI 25 -29 Overweight Nutritional Risks: None Diabetes: Yes CBG done?: Yes (CBG 100) CBG resulted in Enter/ Edit results?: Yes Did pt. bring in CBG monitor from home?: No  How often do you need to have someone help you when you read instructions, pamphlets, or other written materials from your doctor or pharmacy?: 3 - Sometimes (Wife assist)  Diabetic?  Yes  Interpreter Needed?: No  Information entered by :: Theresa Mulligan LPN   Activities of Daily Living    07/31/2022   10:04 AM 07/30/2022   12:20 PM  In your present state of health, do you have any difficulty performing the following activities:  Hearing? 0 0  Vision? 0 1  Difficulty concentrating or making decisions? 0 0  Walking or climbing stairs? 1 1  Comment Uses walker and cane   Dressing or bathing? 1 1  Comment Wife assist   Doing errands, shopping? 1 1  Comment Wife Advice worker and eating ? N Y  Using the Toilet? N Y  In the past six months, have you accidently leaked urine? Y Y  Comment Followed by PCP   Do you have problems with loss of bowel control? N N  Managing your Medications? N N  Managing your Finances? N N  Housekeeping or managing your Housekeeping? N N    Patient Care Team: [name], PA as PCP - General (Family Medicine) End, Cristal Deer, MD as PCP - Cardiology (Cardiology) [name], MD as PCP - Electrophysiology (Cardiology) Glyn Ade, PA-C as Physician Assistant (Dermatology)  Indicate any recent Medical Services you may have received from other than [facility] providers in the past year (date may be approximate).     Assessment:   This is a routine wellness examination for Jacob Harrison.  Hearing/Vision screen Hearing Screening - Comments:: Denies hearing difficulties   Vision Screening - Comments:: Wears rx glasses - up to date with routine eye exams with  Dr Cathey Endow  Dietary issues and exercise activities discussed: Current Exercise Habits: Home exercise routine, Time (Minutes): 10, Frequency (Times/Week): 7, Weekly Exercise (Minutes/Week): 70, Intensity: Mild, Exercise limited by: orthopedic condition(s)   Goals Addressed               This Visit's Progress     Increase physical activity (pt-stated)        I want to walk without a cane.       Depression Screen    07/31/2022   10:03 AM 07/24/2022   10:28 AM 05/20/2022   10:26 AM 05/08/2022   10:01 AM 02/17/2022   10:57 AM 10/22/2021   10:03 AM 04/10/2021   10:07 AM  PHQ 2/9 Scores  PHQ - 2 Score 0 1 0 0 1 0 0  PHQ- 9 Score 0 10 4  5 2  0    Fall Risk    07/31/2022   10:08 AM 07/30/2022   12:20 PM 05/20/2022   10:27 AM 02/17/2022   10:57  AM 10/22/2021   10:03 AM  Fall Risk   Falls in the past year? 1 1 0 0 0  Number falls in past yr: 0 0 0 0   Injury with Fall? 0 0 0 0   Risk for fall due to : No Fall Risks    Impaired balance/gait  Follow up Falls prevention discussed    Falls evaluation completed    FALL RISK PREVENTION PERTAINING TO THE HOME:  Any stairs in or around the home? Yes  If so, are there any without handrails? No  Home free of loose throw rugs in walkways, pet beds, electrical cords, etc? Yes  Adequate lighting in your home to reduce risk of falls? Yes   ASSISTIVE DEVICES UTILIZED TO PREVENT FALLS:  Life alert? No  Use of a cane, walker or w/c? Yes  Grab bars in  the bathroom? Yes  Shower chair or bench in shower? Yes  Elevated toilet seat or a handicapped toilet? No   TIMED UP AND GO:  Was the test performed? No . Audio Visit  Cognitive Function:        07/31/2022   10:09 AM 04/10/2021   10:09 AM 04/18/2020   10:44 AM 08/17/2018    8:17 AM  6CIT Screen  What Year? 0 points 0 points 0 points 0 points  What month? 0 points 0 points 0 points 0 points  What time? 0 points 0 points 0 points 0 points  Count back from 20 0 points 0 points 0 points 0 points  Months in reverse 0 points 0 points 0 points 0 points  Repeat phrase 0 points 0 points 0 points 0 points  Total Score 0 points 0 points 0 points 0 points    Immunizations Immunization History  Administered Date(s) Administered   Fluad Quad(high Dose 65+) 01/07/2021, 02/17/2022   Influenza, High Dose Seasonal PF 02/25/2017, 04/14/2018, 01/06/2019   Influenza-Unspecified 04/08/2016, 01/06/2019, 04/05/2020   PFIZER(Purple Top)SARS-COV-2 Vaccination 05/11/2019, 06/01/2019   Pneumococcal Conjugate-13 04/07/2006   Pneumococcal Polysaccharide-23 11/27/2016   Td 04/07/2014    TDAP status: Up to date  Flu Vaccine status: Up to date  Pneumococcal vaccine status: Up to date  Covid-19 vaccine status: Completed vaccines  Qualifies for Shingles Vaccine? Yes   Zostavax completed No   Shingrix Completed?: No.    Education has been provided regarding the importance of this vaccine. Patient has been advised to call insurance company to determine out of pocket expense if they have not yet received this vaccine. Advised may also receive vaccine at local pharmacy or Health Dept. Verbalized acceptance and understanding.  Screening Tests Health Maintenance  Topic Date Due   FOOT EXAM  09/04/2021   COVID-19 Vaccine (3 - Pfizer risk series) 08/16/2022 (Originally 06/29/2019)   Zoster Vaccines- Shingrix (1 of 2) 10/30/2022 (Originally 02/07/1960)   Diabetic kidney evaluation - Urine ACR  10/24/2022    INFLUENZA VACCINE  11/06/2022   HEMOGLOBIN A1C  11/18/2022   OPHTHALMOLOGY EXAM  07/28/2023   Diabetic kidney evaluation - eGFR measurement  07/30/2023   Medicare Annual Wellness (AWV)  07/31/2023   DTaP/Tdap/Td (2 - Tdap) 04/07/2024   Pneumonia Vaccine 40+ Years old  Completed   Hepatitis C Screening  Completed   HPV VACCINES  Aged Out   COLONOSCOPY (Pts 45-71yrs Insurance coverage will need to be confirmed)  Discontinued    Health Maintenance  Health Maintenance Due  Topic Date Due   FOOT EXAM  09/04/2021    Colorectal cancer  screening: No longer required.   Lung Cancer Screening: (Low Dose CT Chest recommended if Age 63-80 years, 30 pack-year currently smoking OR have quit w/in 15years.) does not qualify.     Additional Screening:  Hepatitis C Screening: does qualify; Completed 07/06/22  Vision Screening: Recommended annual ophthalmology exams for early detection of glaucoma and other disorders of the eye. Is the patient up to date with their annual eye exam?  Yes  Who is the provider or what is the name of the office in which the patient attends annual eye exams? Dr Cathey Endow If pt is not established with a provider, would they like to be referred to a provider to establish care? No .   Dental Screening: Recommended annual dental exams for proper oral hygiene  Community Resource Referral / Chronic Care Management:  CRR required this visit?  No   CCM required this visit?  No      Plan:     I have personally reviewed and noted the following in the patient's chart:   Medical and social history Use of alcohol, tobacco or illicit drugs  Current medications and supplements including opioid prescriptions. Patient is not currently taking opioid prescriptions. Functional ability and status Nutritional status Physical activity Advanced directives List of other physicians Hospitalizations, surgeries, and ER visits in previous 12 months Vitals Screenings to include  cognitive, depression, and falls Referrals and appointments  In addition, I have reviewed and discussed with patient certain preventive protocols, quality metrics, and best practice recommendations. A written personalized care plan for preventive services as well as general preventive health recommendations were provided to patient.     Tillie Rung, LPN   [id number]/[id number]   Nurse Notes: None

## 2022-07-31 NOTE — Progress Notes (Signed)
Follow-up Outpatient Visit Date: 07/31/2022  Primary Care Provider: [name], PA [address] [place] [address]  Chief Complaint: Follow-up coronary artery disease and HFrEF complicated by recent hospitalization for nontraumatic rhabdomyolysis  HPI:  Mr. Murley is a 82 y.o. 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  I last saw him in February, at which time he was feeling a little bit better though he continued to notice shortness of breath and chest tightness with mild activity.  Subsequent right and left heart catheterization showed severe native CAD similar to prior catheterization in 2021.  All bypass grafts known to be occluded.  Left and right heart filling pressures and cardiac output were normal.  We agreed to decrease carvedilol due to borderline low blood pressure, bradycardia, and fatigue.  Subsequent CT chest showed left hemithorax volume loss with pleural thickening and pleural calcifications without effusion.  Bilateral lung areas of interstitial septal thickening, scarring fibrotic changes, and groundglass particularly along the lower lung zones were noted and concerning for possible interstitial lung disease.  He was seen in follow-up last month and was feeling a little better.  He was hospitalized last month with muscle pains and diagnosed with rhabdomyolysis due to concurrent statin therapy and recent addition of terbinafine for onychomycosis.  He subsequently developed some edema and shortness of breath in the setting of having received IV fluids during his hospitalization.  He was advised that it was okay to take an extra furosemide until his edema/dyspnea return to baseline.  He saw Dr.  Sherene Sires in the pulmonology clinic who made mention of multiple potential etiologies of Mr. Gentry's shortness of breath.  He was concerned that GERD could be contributing as well.  Today, Mr. Clingenpeel reports that he is feeling somewhat better since leaving the hospital though he continues to have generalized weakness.  This is most profound in his hips and thighs.  He is trying to do some exercises at home that he had learned in the past with physical therapy.  In regard to his shortness of breath, it might be slightly better than at our prior visits.  He is still quite swollen; this began after his recent hospitalization with rhabdomyolysis during which he received aggressive IV hydration.  His weight is gradually coming down since we increased furosemide to 40 mg twice daily.  He has noticed some difficulty emptying his bladder over the last few weeks which began around the time that Cymbalta was added for treatment of possible neuropathy.  He has not had any chest pain, palpitations, or lightheadedness.  --------------------------------------------------------------------------------------------------  Cardiovascular History & Procedures: Cardiovascular Problems: Coronary artery disease Ischemic cardiomyopathy Heart failure with preserved ejection fraction (HFpEF)   Risk Factors: Known coronary artery disease, hypertension, hyperlipidemia, diabetes mellitus, male gender, obesity, and age > 26   Cath/PCI: LHC (10/04/2020): LMCA with mild diffuse disease.  LAD with moderate diffuse proximal disease and chronic subtotal occlusion of the mid vessel.  Distal LAD fills via left to left and right to left collaterals.  LCx with moderate mid vessel disease.  RCA chronically occluded proximally, filling via left to right collaterals.  SVGs to RPDA, LAD, and OM are chronically occluded.  LIMA to LAD known to be atretic.  LVEDP 22 mmHg. LHC (10/18/12, [facility], [place], [place]): LMCA 20-30% ostial/proximal  stenosis. LAD with sequential 100% and 95-99% mid stenoses. LCx with 40-50% mid stenosis. RCA with 100% proximal and 80-100% sequential distal lesions. RPDA and rPL1 have 99% and 95% stenoses, respectively. SVG->LAD and SVG->rPDA are patent. LVEF 67% with normal wall motion. LHC (04/05/10, [facility], [place], [place]): LMCA with 30% mid stenosis. LAD with 100% midvessel occlution. LCx with 40% mid stenosis. RCA with 100% proximal stenosis. LIMA->LAD atretic, SVG->LCx occluded, and SVG-> RCA with 80% ostial and 70% distal stenoses.   CV Surgery: Redo CABG (04/2010, [place], [place]): SVG->LAD and SVG->rPDA. CABG (2001, [place]): LIMA->LAD, SVG->LCx, and SVG->rPDA   EP Procedures and Devices: Medtronic single-chamber ICD (03/20/2021, Dr. Lalla Brothers)   Non-Invasive Evaluation(s): TTE (04/21/2022): Normal LV size.  There is mild basal septal LVH with LVEF of 30-35%.  There is global hypokinesis.  Diastolic function is indeterminate.  RV is mildly enlarged with low normal systolic function.  Left atrium is mildly enlarged.  There is mild mitral regurgitation.  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  Ascending aorta is mildly enlarged, measuring 3.9 cm.  CVP normal. Limited TTE (02/20/2021): LVEF 30-35%.  Mild mitral regurgitation.  Aortic sclerosis without stenosis. TTE (11/27/2020): Normal LV size with mild LVH.  LVEF 30-35% with extensive wall motion abnormalities.  Grade 3 diastolic dysfunction with elevated filling pressure noted.  Normal RV size and function.  Mild left atrial enlargement.  Degenerative mitral valve with mild thickening and mild-moderate MR.  Mild aortic stenosis noted with valve area 2 cm and mean gradient 9 mmHg. TTE (09/23/2019): Normal LV size and wall thickness.  LVEF 60-65% with grade 1 diastolic dysfunction.  Normal RV size and function.  No atrial enlargement.  Aortic sclerosis without stenosis.  Normal CVP. Pharmacologic MPI (11/06/16): Low  risk study without ischemia or scar. LVEF 48% (calculated) but visually appears normal. TTE (10/21/16): Normal LV size with mild LVH. LVEF 65-70% with normal diastolic function. Mild LA enlargement. Normal RV size and function. ABIs (03/08/15): Right 1.25, left 1.26; no significant change with exercise. Exercise myocardial perfusion stress test (10/07/12): Small to moderate in size, mild to moderate in severity, partially reversible defect involving the mid inferior and inferolateral segments. LVEF 65%.  Recent CV Pertinent Labs: Lab Results  Component Value Date   CHOL 126 07/07/2022   CHOL 139 02/17/2022   HDL 37 (L) 07/07/2022   HDL 73 02/17/2022   LDLCALC 65 07/07/2022   LDLCALC 49 02/17/2022   TRIG 118 07/07/2022   CHOLHDL 3.4 07/07/2022   BNP 107.1 (H) 05/22/2022   K 4.4 07/30/2022   MG 1.9 02/25/2019   BUN 14 07/30/2022   CREATININE 0.85 07/30/2022    Past medical and surgical history were reviewed and updated in EPIC.  Current Meds  Medication Sig   acetaminophen (TYLENOL) 500 MG tablet Take 1,000 mg by mouth every 6 (six) hours as needed for moderate pain or headache.   aspirin EC 81 MG tablet Take 1 tablet (81 mg total) by mouth daily.   carvedilol (COREG) 12.5 MG tablet Take 0.5 tablets (6.25 mg total) by mouth 2 (two) times daily with a meal.   Cholecalciferol (VITAMIN D-3 PO) Take 800 Units by mouth daily.   clopidogrel (PLAVIX) 75 MG tablet Take 75 mg by mouth daily.   cyanocobalamin 500 MCG tablet Take 500 mcg by mouth daily.   DULoxetine (CYMBALTA) 30 MG capsule  Take 2 capsules (60 mg total) by mouth at bedtime. Take 1 capsule at night for 7 days, then increase to 2 capsules nightly.   empagliflozin (JARDIANCE) 10 MG TABS tablet Take 1 tablet (10 mg total) by mouth daily before breakfast.   famotidine (PEPCID) 20 MG tablet One after supper   finasteride (PROSCAR) 5 MG tablet TAKE 1 TABLET BY MOUTH EVERY DAY   furosemide (LASIX) 40 MG tablet Take 1 tablet (40 mg total)  by mouth daily. (Patient taking differently: Take 40 mg by mouth 2 (two) times daily.)   Glucosamine-Chondroit-Vit C-Mn (GLUCOSAMINE 1500 COMPLEX) CAPS Take 1 capsule by mouth daily.   isosorbide mononitrate (IMDUR) 30 MG 24 hr tablet Take 1 tablet (30 mg total) by mouth 2 (two) times daily.   Lancets (ONETOUCH ULTRASOFT) lancets Use to check blood sugars fasting daily   losartan (COZAAR) 25 MG tablet Take 12.5 mg by mouth daily.   Multiple Vitamins-Minerals (PRESERVISION AREDS 2 PO) Take 1 capsule by mouth 2 (two) times a day.   nitroGLYCERIN (NITROSTAT) 0.4 MG SL tablet PLACE 1 TABLET UNDER THE TONGUE EVERY 5 MINUTES AS NEEDED.   pantoprazole (PROTONIX) 40 MG tablet Take 1 tablet (40 mg total) by mouth daily. Take 30-60 min before first meal of the day   ranolazine (RANEXA) 1000 MG SR tablet TAKE 1 TABLET BY MOUTH TWICE A DAY   triamcinolone cream (KENALOG) 0.1 % Apply 1 Application topically as directed. Qd up to 5 days per week to itchy rash on body until clear, avoid face, groin, axilla    Allergies: Statins, Other, and Tetracyclines & related  Social History   Tobacco Use   Smoking status: Never    Passive exposure: Never   Smokeless tobacco: Never  Vaping Use   Vaping Use: Never used  Substance Use Topics   Alcohol use: Not Currently    Comment: 1-2 drinks on occasion previously 5-7 drinks a week (was 10-15 drinks/week)   Drug use: No    Family History  Problem Relation Age of Onset   Diabetes Mother    Hyperlipidemia Mother    Cancer Father        colon   Skin cancer Brother    Alcohol abuse Maternal Uncle    Diabetes Maternal Uncle    Hyperlipidemia Maternal Uncle    Cancer Paternal Uncle        colon    Review of Systems: A 12-system review of systems was performed and was negative except as noted in the HPI.  --------------------------------------------------------------------------------------------------  Physical Exam: BP (!) 90/54 (BP Location: Left Arm,  Patient Position: Sitting, Cuff Size: Normal)   Pulse 74   Ht 5\' 7"  (1.702 m)   Wt 171 lb 4 oz (77.7 kg)   SpO2 98%   BMI 26.82 kg/m   General:  NAD. Neck: No JVD or HJR. Lungs: Clear to auscultation bilaterally without wheezes or crackles. Heart: Regular rate and rhythm with 1/6 systolic murmur.  No rubs or gallops. Abdomen: Soft, nontender, nondistended. Extremities: 2+ pitting edema noted in both calves.  EKG: Normal sinus rhythm with first-degree AV block (PR interval 216 ms) and anterior infarct.  No significant change from prior tracing on 07/05/2022.  Lab Results  Component Value Date   WBC 8.2 07/30/2022   HGB 12.0 (L) 07/30/2022   HCT 35.3 (L) 07/30/2022   MCV 97 07/30/2022   PLT 341 07/30/2022    Lab Results  Component Value Date   NA 136 07/30/2022  K 4.4 07/30/2022   CL 94 (L) 07/30/2022   CO2 23 07/30/2022   BUN 14 07/30/2022   CREATININE 0.85 07/30/2022   GLUCOSE 91 07/30/2022   ALT 19 07/30/2022    Lab Results  Component Value Date   CHOL 126 07/07/2022   HDL 37 (L) 07/07/2022   LDLCALC 65 07/07/2022   TRIG 118 07/07/2022   CHOLHDL 3.4 07/07/2022    --------------------------------------------------------------------------------------------------  ASSESSMENT AND PLAN: Acute on 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 Heinze reports that his chronic exertional dyspnea is a little bit better, though he is clearly volume overloaded on examination.  This is likely due to excessive intravenous hydration during recent hospitalization for rhabdomyolysis.  He is slowly diuresing with furosemide 40 mg twice daily.  We have agreed to increase his diuretic regimen to furosemide 80 mg every morning and 40 mg every afternoon until his edema has resolved.  We will check a BMP in about 2 weeks to ensure stable renal function and potassium.  Continue GDMT consisting of carvedilol, empagliflozin, and losartan.  Soft blood pressure precludes further  escalation at this time.  Coronary artery disease with stable angina: No chest pain reported.  Chronic exertional dyspnea may be slightly better.  Continue aggressive secondary prevention with aspirin and clopidogrel as well as antianginal therapy with isosorbide mononitrate and carvedilol.  Hyperlipidemia associated with type 2 diabetes mellitus and rhabdomyolysis: Lipids had been adequately controlled on previous combination of ezetimibe and rosuvastatin.  Unfortunately, Mr. Laduke recently experienced rhabdomyolysis after addition of Lamisil.  We will continue to hold rosuvastatin and ezetimibe given persistent but improving muscle weakness.  Recent repeat creatine kinase level was normal.  I would favor trying a PCSK9 inhibitor in place of rosuvastatin/ezetimibe in the future, though we will defer this until residual myalgias have resolved.  Follow-up: Return to clinic in 1 month.  [name], MD 07/31/2022 2:18 PM

## 2022-07-31 NOTE — Patient Instructions (Signed)
Medication Instructions:  Increase Lasix to 80 mg (2 tablets) in the morning and 40 mg ( 1 tablet) in the evening.   *If you need a refill on your cardiac medications before your next appointment, please call your pharmacy*   Lab Work: Your provider would like for you to return in 2 weeks to have the following labs drawn: BMET.   Please go to the [facility] entrance and check in at the front desk.  You do not need an appointment.  They are open from 7am-6 pm.   If you have labs (blood work) drawn today and your tests are completely normal, you will receive your results only by: MyChart Message (if you have MyChart) OR A paper copy in the mail If you have any lab test that is abnormal or we need to change your treatment, we will call you to review the results.   Testing/Procedures: No test ordered today.    Follow-Up: At [name], you and your health needs are our priority.  As part of our continuing mission to provide you with exceptional heart care, we have created designated Provider Care Teams.  These Care Teams include your primary Cardiologist (physician) and Advanced Practice Providers (APPs -  Physician Assistants and Nurse Practitioners) who all work together to provide you with the care you need, when you need it.  We recommend signing up for the patient portal called "MyChart".  Sign up information is provided on this After Visit Summary.  MyChart is used to connect with patients for Virtual Visits (Telemedicine).  Patients are able to view lab/test results, encounter notes, upcoming appointments, etc.  Non-urgent messages can be sent to your provider as well.   To learn more about what you can do with MyChart, go to [email or web address].    Your next appointment:   1 month(s)  Provider:   You may see [name], MD or one of the following Advanced Practice Providers on your designated Care Team:   Nicolasa Ducking, NP Eula Listen, PA-C [place]  Fransico Michael, [place] Charlsie Quest, NP    Other Instructions Dr. Okey Dupre  recommends checking and keeping a log of your blood pressure daily. Please call the office if you notice your blood pressure is consistently less than 90/50.  It is best to check your blood pressure 1-2 hours after taking your medications.  Below are some tips that our clinical pharmacists share for home BP monitoring:          Rest 10 minutes before taking your blood pressure.          Don't smoke or drink caffeinated beverages for at least 30 minutes before.          Take your blood pressure before (not after) you eat.          Sit comfortably with your back supported and both feet on the floor (don't cross your legs).          Elevate your arm to heart level on a table or a desk.          Use the proper sized cuff. It should fit smoothly and snugly around your bare upper arm. There should be enough room to slip a fingertip under the cuff. The bottom edge of the cuff should be 1 inch above the crease of the elbow.

## 2022-07-31 NOTE — Patient Instructions (Addendum)
[name] , Thank you for taking time to come for your Medicare Wellness Visit. I appreciate your ongoing commitment to your health goals. Please review the following plan we discussed and let me know if I can assist you in the future.   These are the goals we discussed:  Goals       Increase physical activity (pt-stated)      I want to walk without a cane.        This is a list of the screening recommended for you and due dates:  Health Maintenance  Topic Date Due   Complete foot exam   09/04/2021   COVID-19 Vaccine (3 - Pfizer risk series) 08/16/2022*   Zoster (Shingles) Vaccine (1 of 2) 10/30/2022*   Yearly kidney health urinalysis for diabetes  10/24/2022   Flu Shot  11/06/2022   Hemoglobin A1C  11/18/2022   Eye exam for diabetics  07/28/2023   Yearly kidney function blood test for diabetes  07/30/2023   Medicare Annual Wellness Visit  07/31/2023   DTaP/Tdap/Td vaccine (2 - Tdap) 04/07/2024   Pneumonia Vaccine  Completed   Hepatitis C Screening: USPSTF Recommendation to screen - Ages 64-79 yo.  Completed   HPV Vaccine  Aged Out   Colon Cancer Screening  Discontinued  *Topic was postponed. The date shown is not the original due date.    Advanced directives: Please bring a copy of your health care power of attorney and living will to the office to be added to your chart at your convenience.   Conditions/risks identified: None  Next appointment: Follow up in one year for your annual wellness visit.    Preventive Care 82 Years and Older, Male  Preventive care refers to lifestyle choices and visits with your health care provider that can promote health and wellness. What does preventive care include? A yearly physical exam. This is also called an annual well check. Dental exams once or twice a year. Routine eye exams. Ask your health care provider how often you should have your eyes checked. Personal lifestyle choices, including: Daily care of your teeth and  gums. Regular physical activity. Eating a healthy diet. Avoiding tobacco and drug use. Limiting alcohol use. Practicing safe sex. Taking low doses of aspirin every day. Taking vitamin and mineral supplements as recommended by your health care provider. What happens during an annual well check? The services and screenings done by your health care provider during your annual well check will depend on your age, overall health, lifestyle risk factors, and family history of disease. Counseling  Your health care provider may ask you questions about your: Alcohol use. Tobacco use. Drug use. Emotional well-being. Home and relationship well-being. Sexual activity. Eating habits. History of falls. Memory and ability to understand (cognition). Work and work Astronomer. Screening  You may have the following tests or measurements: Height, weight, and BMI. Blood pressure. Lipid and cholesterol levels. These may be checked every 5 years, or more frequently if you are over 22 years old. Skin check. Lung cancer screening. You may have this screening every year starting at age 36 if you have a 30-pack-year history of smoking and currently smoke or have quit within the past 15 years. Fecal occult blood test (FOBT) of the stool. You may have this test every year starting at age 76. Flexible sigmoidoscopy or colonoscopy. You may have a sigmoidoscopy every 5 years or a colonoscopy every 10 years starting at age 57. Prostate cancer screening. Recommendations will vary depending  on your family history and other risks. Hepatitis C blood test. Hepatitis B blood test. Sexually transmitted disease (STD) testing. Diabetes screening. This is done by checking your blood sugar (glucose) after you have not eaten for a while (fasting). You may have this done every 1-3 years. Abdominal aortic aneurysm (AAA) screening. You may need this if you are a current or former smoker. Osteoporosis. You may be screened  starting at age 40 if you are at high risk. Talk with your health care provider about your test results, treatment options, and if necessary, the need for more tests. Vaccines  Your health care provider may recommend certain vaccines, such as: Influenza vaccine. This is recommended every year. Tetanus, diphtheria, and acellular pertussis (Tdap, Td) vaccine. You may need a Td booster every 10 years. Zoster vaccine. You may need this after age 18. Pneumococcal 13-valent conjugate (PCV13) vaccine. One dose is recommended after age 87. Pneumococcal polysaccharide (PPSV23) vaccine. One dose is recommended after age 30. Talk to your health care provider about which screenings and vaccines you need and how often you need them. This information is not intended to replace advice given to you by your health care provider. Make sure you discuss any questions you have with your health care provider. Document Released: 04/20/2015 Document Revised: 12/12/2015 Document Reviewed: 01/23/2015 Elsevier Interactive Patient Education  2017 [employer] Prevention in the Home Falls can cause injuries. They can happen to people of all ages. There are many things you can do to make your home safe and to help prevent falls. What can I do on the outside of my home? Regularly fix the edges of walkways and driveways and fix any cracks. Remove anything that might make you trip as you walk through a door, such as a raised step or threshold. Trim any bushes or trees on the path to your home. Use bright outdoor lighting. Clear any walking paths of anything that might make someone trip, such as rocks or tools. Regularly check to see if handrails are loose or broken. Make sure that both sides of any steps have handrails. Any raised decks and porches should have guardrails on the edges. Have any leaves, snow, or ice cleared regularly. Use sand or salt on walking paths during winter. Clean up any spills in your garage  right away. This includes oil or grease spills. What can I do in the bathroom? Use night lights. Install grab bars by the toilet and in the tub and shower. Do not use towel bars as grab bars. Use non-skid mats or decals in the tub or shower. If you need to sit down in the shower, use a plastic, non-slip stool. Keep the floor dry. Clean up any water that spills on the floor as soon as it happens. Remove soap buildup in the tub or shower regularly. Attach bath mats securely with double-sided non-slip rug tape. Do not have throw rugs and other things on the floor that can make you trip. What can I do in the bedroom? Use night lights. Make sure that you have a light by your bed that is easy to reach. Do not use any sheets or blankets that are too big for your bed. They should not hang down onto the floor. Have a firm chair that has side arms. You can use this for support while you get dressed. Do not have throw rugs and other things on the floor that can make you trip. What can I do in the kitchen?  Clean up any spills right away. Avoid walking on wet floors. Keep items that you use a lot in easy-to-reach places. If you need to reach something above you, use a strong step stool that has a grab bar. Keep electrical cords out of the way. Do not use floor polish or wax that makes floors slippery. If you must use wax, use non-skid floor wax. Do not have throw rugs and other things on the floor that can make you trip. What can I do with my stairs? Do not leave any items on the stairs. Make sure that there are handrails on both sides of the stairs and use them. Fix handrails that are broken or loose. Make sure that handrails are as long as the stairways. Check any carpeting to make sure that it is firmly attached to the stairs. Fix any carpet that is loose or worn. Avoid having throw rugs at the top or bottom of the stairs. If you do have throw rugs, attach them to the floor with carpet tape. Make  sure that you have a light switch at the top of the stairs and the bottom of the stairs. If you do not have them, ask someone to add them for you. What else can I do to help prevent falls? Wear shoes that: Do not have high heels. Have rubber bottoms. Are comfortable and fit you well. Are closed at the toe. Do not wear sandals. If you use a stepladder: Make sure that it is fully opened. Do not climb a closed stepladder. Make sure that both sides of the stepladder are locked into place. Ask someone to hold it for you, if possible. Clearly mark and make sure that you can see: Any grab bars or handrails. First and last steps. Where the edge of each step is. Use tools that help you move around (mobility aids) if they are needed. These include: Canes. Walkers. Scooters. Crutches. Turn on the lights when you go into a dark area. Replace any light bulbs as soon as they burn out. Set up your furniture so you have a clear path. Avoid moving your furniture around. If any of your floors are uneven, fix them. If there are any pets around you, be aware of where they are. Review your medicines with your doctor. Some medicines can make you feel dizzy. This can increase your chance of falling. Ask your doctor what other things that you can do to help prevent falls. This information is not intended to replace advice given to you by your health care provider. Make sure you discuss any questions you have with your health care provider. Document Released: 01/18/2009 Document Revised: 08/30/2015 Document Reviewed: 04/28/2014 Elsevier Interactive Patient Education  2017 [employer].

## 2022-08-01 ENCOUNTER — Encounter: Payer: Self-pay | Admitting: Family Medicine

## 2022-08-01 NOTE — Progress Notes (Signed)
Cardiology Office Note Date:  08/05/2022  Patient ID:  Jacob Harrison, Jacob Harrison December 11, 1940, MRN [id number] PCP:  [name], PA  Cardiologist:  [name], MD Electrophysiologist: [name], MD    Chief Complaint: 1 year ICD follow-up  History of Present Illness: Quency Harrison is a 82 y.o. male with PMH notable for HFrEF, CAD s/p CABG x2, s/p ICD; seen today for [name], MD for routine electrophysiology followup.  Last saw Dr. Lalla Brothers 06/2021, doing well He has seen Dr. Okey Dupre numerous times since then, most recently 07/31/22. He was fluid overloaded after recent hospitalization for rhabdo that included aggressive IV hydration. Lasix increased to 80/40.   Today, he tells me that his lower extremity edema is improving, but slowly. He has good UOP with the diuretic regimen Dr. Okey Dupre has him on. Able to sleep flat, good appetite. No cough, no SOB with walking but does have to use a cane b/c of feeling unsteady with edematous feet.    Device Information: MDT single chamber ICD, imp 03/2021; dx ICM, HFrEF  Past Medical History:  Diagnosis Date   (HFpEF) heart failure with preserved ejection fraction (HCC)    a. 10/2016 Echo: EF 65-70%, mild LVH, mildly dil LA. RV fxn nl.   Arthritis    BPH (benign prostatic hyperplasia)    Coronary artery disease    a. 2001 CABG x 3 [facility]): LIMA->LAD, VG->LCX, VG->RPDA; b. 04/05/2010 Cath ([place]): LM 72m, LAD 127m, LCX 26m, RCA 100p, LIMA->LAD atretic, VG->LCX 100, VG->RCA 80ost, 70d; c. 04/2010 Redo CABG x 2 ([place]): VG->LAD, VG->RPDA; d. 10/2012 Cath [facility]): LM 20-30, LAD 100/95-3m, LCX 40-43m, RCA 100p, 80-100d, RPDA 99, RPL1 95, VG->LAD ok, VG->RPDA ok, EF 67%; e. 11/2016 MV: EF 48%, No ischemia.   Diabetes mellitus without complication (HCC)    Type II   Hyperlipidemia    Hypertension    Ischemic cardiomyopathy    a. 03/2021 s/p MDT Visia AF MRI VR SureScan single lead AICD (ser# [id number]).   Scoliosis     Spinal stenosis     Past Surgical History:  Procedure Laterality Date   CARDIAC CATHETERIZATION     CORONARY ARTERY BYPASS GRAFT     x 2 (2001 and redo in late 2011 or early 2012)   HERNIA REPAIR Left    inguinal   ICD IMPLANT N/A 03/20/2021   Procedure: ICD IMPLANT;  Surgeon: [name], MD;  Location: [facility] INVASIVE CV LAB;  Service: Cardiovascular;  Laterality: N/A;   LEFT HEART CATH AND CORS/GRAFTS ANGIOGRAPHY N/A 10/04/2020   Procedure: LEFT HEART CATH AND CORS/GRAFTS ANGIOGRAPHY;  Surgeon: [name], MD;  Location: [facility] INVASIVE CV LAB;  Service: Cardiovascular;  Laterality: N/A;   LUMBAR LAMINECTOMY/DECOMPRESSION MICRODISCECTOMY N/A 10/20/2018   Procedure: L3-4, L4-5 LUMBAR DECOMPRESSION with dural repair.;  Surgeon: [name], MD;  Location: [facility] OR;  Service: Orthopedics;  Laterality: N/A;   RIGHT/LEFT HEART CATH AND CORONARY/GRAFT ANGIOGRAPHY N/A 05/27/2022   Procedure: RIGHT/LEFT HEART CATH AND CORONARY/GRAFT ANGIOGRAPHY;  Surgeon: [name], MD;  Location: [facility] INVASIVE CV LAB;  Service: Cardiovascular;  Laterality: N/A;   TONSILLECTOMY AND ADENOIDECTOMY     TRANSURETHRAL RESECTION OF PROSTATE  1990    Current Outpatient Medications  Medication Instructions   acetaminophen (TYLENOL) 1,000 mg, Oral, Every 6 hours PRN   aspirin EC 81 mg, Oral, Daily   carvedilol (COREG) 6.25 mg, Oral, 2 times daily with meals   Cholecalciferol (VITAMIN D-3 PO) 800 Units, Oral,  Daily   clopidogrel (PLAVIX) 75 mg, Oral, Daily   cyanocobalamin (VITAMIN B12) 500 mcg, Oral, Daily   DULoxetine (CYMBALTA) 60 mg, Oral, Daily at bedtime, Take 1 capsule at night for 7 days, then increase to 2 capsules nightly.   empagliflozin (JARDIANCE) 10 mg, Oral, Daily before breakfast   famotidine (PEPCID) 20 MG tablet One after supper   finasteride (PROSCAR) 5 MG tablet TAKE 1 TABLET BY MOUTH EVERY DAY   furosemide (LASIX) 40 MG tablet Take 2 tablets (80 mg total) by mouth every morning  AND 1 tablet (40 mg total) every evening.   Glucosamine-Chondroit-Vit C-Mn (GLUCOSAMINE 1500 COMPLEX) CAPS 1 capsule, Oral, Daily   isosorbide mononitrate (IMDUR) 30 mg, Oral, 2 times daily   Lancets (ONETOUCH ULTRASOFT) lancets Use to check blood sugars fasting daily   losartan (COZAAR) 12.5 mg, Oral, Daily   Multiple Vitamins-Minerals (PRESERVISION AREDS 2 PO) 1 capsule, Oral, 2 times daily   nitroGLYCERIN (NITROSTAT) 0.4 MG SL tablet PLACE 1 TABLET UNDER THE TONGUE EVERY 5 MINUTES AS NEEDED.   pantoprazole (PROTONIX) 40 mg, Oral, Daily, Take 30-60 min before first meal of the day   ranolazine (RANEXA) 1000 MG SR tablet TAKE 1 TABLET BY MOUTH TWICE A DAY   triamcinolone cream (KENALOG) 0.1 % 1 Application, Topical, As directed, Qd up to 5 days per week to itchy rash on body until clear, avoid face, groin, axilla    Social History:  The patient  reports that he has never smoked. He has never been exposed to tobacco smoke. He has never used smokeless tobacco. He reports that he does not currently use alcohol. He reports that he does not use drugs.   Family History:  The patient's family history includes Alcohol abuse in his maternal uncle; Cancer in his father and paternal uncle; Diabetes in his maternal uncle and mother; Hyperlipidemia in his maternal uncle and mother; Skin cancer in his brother.  ROS:  Please see the history of present illness. All other systems are reviewed and otherwise negative.   PHYSICAL EXAM:  VS:  BP 108/62 (BP Location: Left Arm, Patient Position: Sitting)   Pulse 73   Ht 5\' 7"  (1.702 m)   Wt 162 lb (73.5 kg)   SpO2 96%   BMI 25.37 kg/m  BMI: Body mass index is 25.37 kg/m.  GEN- The patient is well appearing, alert and oriented x 3 today.   Lungs- Clear to ausculation bilaterally, normal work of breathing.  Heart- Regular rate and rhythm, no murmurs, rubs or gallops; JVD up to mandible Extremities- 2-3+ peripheral edema up to bilat knees, warm, dry Skin-    device pocket well-healed   Device interrogation done today and reviewed by myself:  Battery 10.1 years Lead threshold, impedence, sensing stable  Very brief, less than 3 second NSVT episodes OptiVol elevated Adjusted VF therapy pathway to B>AX per safety notification  EKG is not ordered.   Recent Labs: 05/22/2022: B Natriuretic Peptide 107.1 07/05/2022: TSH 2.307 07/30/2022: ALT 19; BUN 14; Creatinine, Ser 0.85; Hemoglobin 12.0; Platelets 341; Potassium 4.4; Sodium 136  07/07/2022: Cholesterol 126; HDL 37; LDL Cholesterol 65; Total CHOL/HDL Ratio 3.4; Triglycerides 118; VLDL 24   Estimated Creatinine Clearance: 65.9 mL/min (by C-G formula based on SCr of 0.85 mg/dL).   Wt Readings from Last 3 Encounters:  08/05/22 162 lb (73.5 kg)  07/31/22 171 lb 4 oz (77.7 kg)  07/31/22 162 lb (73.5 kg)     Additional studies reviewed include: Previous EP, cardiology notes.  R/LHC, 05/27/2022 Severe multivessel coronary artery disease, including chronic total occlusions of mid LAD and proximal RCA.  There is moderate LMCA and LCx disease.  Overall appearance is similar to prior catheterization in 2020. Known occlusions of all bypass grafts; occlusions of SVG-LAD and SVG-RCA redemonstrated today. Normal left heart and right heart filling pressures. Normal Fick cardiac output/index.   TTE, 04/21/2022  1. Left ventricular ejection fraction, by estimation, is 30 to 35%. The left ventricle has moderate to severely decreased function. The left ventricle demonstrates global hypokinesis. There is mild left ventricular hypertrophy of the basal-septal  segment. Left ventricular diastolic parameters are indeterminate.  2. Right ventricular systolic function is low normal. The right ventricular size is mildly enlarged.   3. Left atrial size was mildly dilated.   4. The mitral valve is normal in structure. Mild mitral valve regurgitation.   5. The aortic valve is tricuspid. Aortic valve regurgitation is not  visualized. Mild aortic valve stenosis. Aortic valve mean gradient measures 11.0 mmHg.   6. Aortic dilatation noted. There is mild dilatation of the ascending aorta, measuring 39 mm.   7. The inferior vena cava is normal in size with greater than 50% respiratory variability, suggesting right atrial pressure of 3 mmHg.   Comparison(s): Limited TTE (02/20/2021): LVEF 30-35%. Mild mitral regurgitation. Aortic sclerosis without stenosis.   ASSESSMENT AND PLAN:  #) HFrEF Fluid overloaded on exam with JVD and bilat lower extremity edema Continues to diurese well with 80/40 lasix Dr. Okey Dupre has labs ordered for next week- reminded patient to obtain GDMT: coreg, losartan,  Diuretic: lasix 80mg  in AM, 40mg  in PM - updated BMP next week   #) ICD in situ Device functioning well, see paceart for details Very brief NSVT episodes, 2 seconds or less Will not increase BB for fear of increasing VP and with fluid overload in place Consider increasing in future of NSVT episodes persist   Current medicines are reviewed at length with the patient today.   The patient does not have concerns regarding his medicines.  The following changes were made today:  none  Labs/ tests ordered today include:  No orders of the defined types were placed in this encounter.    Disposition: Follow up with Dr. Lalla Brothers or EP APP in 12 months   Signed, Sherie Don, NP  08/05/22  3:30 PM  Electrophysiology [facility]

## 2022-08-02 ENCOUNTER — Encounter: Payer: Self-pay | Admitting: Internal Medicine

## 2022-08-05 ENCOUNTER — Encounter: Payer: Self-pay | Admitting: Internal Medicine

## 2022-08-05 ENCOUNTER — Encounter: Payer: Self-pay | Admitting: Family Medicine

## 2022-08-05 ENCOUNTER — Encounter: Payer: Self-pay | Admitting: Cardiology

## 2022-08-05 ENCOUNTER — Ambulatory Visit: Payer: Medicare HMO | Attending: Cardiology | Admitting: Cardiology

## 2022-08-05 VITALS — BP 108/62 | HR 73 | Ht 67.0 in | Wt 162.0 lb

## 2022-08-05 DIAGNOSIS — I5022 Chronic systolic (congestive) heart failure: Secondary | ICD-10-CM | POA: Diagnosis not present

## 2022-08-05 DIAGNOSIS — I25708 Atherosclerosis of coronary artery bypass graft(s), unspecified, with other forms of angina pectoris: Secondary | ICD-10-CM

## 2022-08-05 DIAGNOSIS — Z95810 Presence of automatic (implantable) cardiac defibrillator: Secondary | ICD-10-CM | POA: Insufficient documentation

## 2022-08-05 LAB — CUP PACEART INCLINIC DEVICE CHECK
Date Time Interrogation Session: 20240430153521
Implantable Lead Connection Status: 753985
Implantable Lead Implant Date: 20221214
Implantable Lead Location: 753860
Implantable Pulse Generator Implant Date: 20221214

## 2022-08-05 NOTE — Patient Instructions (Signed)
Medication Instructions:  Your physician recommends that you continue on your current medications as directed. Please refer to the Current Medication list given to you today.  *If you need a refill on your cardiac medications before your next appointment, please call your pharmacy*  Lab Work: No labs ordered  If you have labs (blood work) drawn today and your tests are completely normal, you will receive your results only by: MyChart Message (if you have MyChart) OR A paper copy in the mail If you have any lab test that is abnormal or we need to change your treatment, we will call you to review the results.  Testing/Procedures: No testing ordered  Follow-Up: At [facility], you and your health needs are our priority.  As part of our continuing mission to provide you with exceptional heart care, we have created designated Provider Care Teams.  These Care Teams include your primary Cardiologist (physician) and Advanced Practice Providers (APPs -  Physician Assistants and Nurse Practitioners) who all work together to provide you with the care you need, when you need it.  We recommend signing up for the patient portal called "MyChart".  Sign up information is provided on this After Visit Summary.  MyChart is used to connect with patients for Virtual Visits (Telemedicine).  Patients are able to view lab/test results, encounter notes, upcoming appointments, etc.  Non-urgent messages can be sent to your provider as well.   To learn more about what you can do with MyChart, go to [email or web address].    Your next appointment:   1 year(s)  Provider:   Cameron Lambert, MD or Suzann Riddle, NP 

## 2022-08-06 ENCOUNTER — Other Ambulatory Visit: Payer: Self-pay | Admitting: Internal Medicine

## 2022-08-07 ENCOUNTER — Ambulatory Visit: Payer: Medicare HMO | Admitting: Dermatology

## 2022-08-11 ENCOUNTER — Telehealth: Payer: Self-pay

## 2022-08-11 NOTE — Telephone Encounter (Signed)
Following alert received from [employer] received for Alert remote reviewed. Normal device function.   Ongoing AF since 08/09/2022 at 0145, AF burden is 2.3% of the time, ? OAC, sent to triage.  Optivol is also elevated.  No OAC on file. Would you like referral to AF clinic?  Pt also saw Levy Sjogren, NP on 08/05/22, noted fluid overload after recent hospitalization for rhabdo that included aggressive IV hydration. Lasix increased to 80/40.

## 2022-08-14 ENCOUNTER — Other Ambulatory Visit
Admission: RE | Admit: 2022-08-14 | Discharge: 2022-08-14 | Disposition: A | Discharge status: Home / Self Care | Payer: Medicare HMO | Attending: Internal Medicine | Admitting: Internal Medicine

## 2022-08-14 ENCOUNTER — Telehealth: Payer: Self-pay | Admitting: Internal Medicine

## 2022-08-14 DIAGNOSIS — Z79899 Other long term (current) drug therapy: Secondary | ICD-10-CM | POA: Diagnosis not present

## 2022-08-14 LAB — BASIC METABOLIC PANEL
Anion gap: 7 (ref 5–15)
BUN: 19 mg/dL (ref 8–23)
CO2: 28 mmol/L (ref 22–32)
Calcium: 9.1 mg/dL (ref 8.9–10.3)
Chloride: 101 mmol/L (ref 98–111)
Creatinine, Ser: 0.75 mg/dL (ref 0.61–1.24)
GFR, Estimated: >60 mL/min (ref >60)
Glucose, Bld: 104 mg/dL — ABNORMAL HIGH (ref 70–99)
Potassium: 4.5 mmol/L (ref 3.5–5.1)
Sodium: 136 mmol/L (ref 135–145)

## 2022-08-14 NOTE — Telephone Encounter (Signed)
Patient advised AF found on device and Dr. Lalla Brothers recommends AF clinic referral. Pt agreeable.

## 2022-08-14 NOTE — Telephone Encounter (Signed)
   Patient Name: Jacob Harrison  DOB: 06/03/1940 MRN: [id number]  Primary Cardiologist: [name], MD  Chart reviewed as part of pre-operative protocol coverage. Cataract extractions are recognized in guidelines as low risk surgeries that do not typically require specific preoperative testing or holding of blood thinner therapy. Therefore, given past medical history and time since last visit, based on ACC/AHA guidelines, Jacob Harrison would be at acceptable risk for the planned procedure without further cardiovascular testing.   I will route this recommendation to the requesting party via Epic fax function and remove from pre-op pool.  Please call with questions.  Sharlene Dory, PA-C 08/14/2022, 12:10 PM

## 2022-08-14 NOTE — Telephone Encounter (Signed)
   Pre-operative Risk Assessment    Patient Name: Jacob Harrison  DOB: [birth year] MRN: [id number]      Request for Surgical Clearance    Procedure:   Cataract surgery  Date of Surgery:  Clearance TBD                                 Surgeon:  Dr Hildred Priest Surgeon's Group or Practice Name:  [facility] Phone number:  [phone] Fax number:  [phone]   Type of Clearance Requested:   - Medical    Type of Anesthesia:   minimal sedation   Additional requests/questions:    SignedNorman Herrlich   08/14/2022, 11:31 AM

## 2022-08-19 ENCOUNTER — Encounter: Payer: Self-pay | Admitting: Family Medicine

## 2022-08-19 ENCOUNTER — Ambulatory Visit (INDEPENDENT_AMBULATORY_CARE_PROVIDER_SITE_OTHER): Payer: Medicare HMO | Admitting: Family Medicine

## 2022-08-19 VITALS — BP 85/56 | HR 68 | Resp 18 | Ht 67.0 in | Wt 157.0 lb

## 2022-08-19 DIAGNOSIS — E119 Type 2 diabetes mellitus without complications: Secondary | ICD-10-CM

## 2022-08-19 DIAGNOSIS — E785 Hyperlipidemia, unspecified: Secondary | ICD-10-CM | POA: Diagnosis not present

## 2022-08-19 DIAGNOSIS — E1169 Type 2 diabetes mellitus with other specified complication: Secondary | ICD-10-CM | POA: Diagnosis not present

## 2022-08-19 DIAGNOSIS — I10 Essential (primary) hypertension: Secondary | ICD-10-CM

## 2022-08-19 LAB — POCT GLYCOSYLATED HEMOGLOBIN (HGB A1C): HbA1c POC (<> result, manual entry): 5 % (ref 4.0–5.6)

## 2022-08-19 NOTE — Assessment & Plan Note (Signed)
Patient should never take any statin medication ever again.  Currently managing with nutrition, plans on beginning PT after cataract surgery to regain muscle strength and get back into a physical activity routine.  Will continue to monitor along with cardiology.

## 2022-08-19 NOTE — Assessment & Plan Note (Signed)
A1c today 5.0.  Adding Jardiance to medication routine has likely contributed to lower A1c.  Encouraged to check blood sugars at home especially to ensure that he is not developing any low blood sugars.  Will continue to monitor.

## 2022-08-19 NOTE — Patient Instructions (Signed)
Keep up the good work

## 2022-08-19 NOTE — Progress Notes (Signed)
Established Patient Office Visit  Subjective   Patient ID: Jacob Harrison, male    DOB: 1941-01-17  Age: 82 y.o. MRN: [id number]  Chief Complaint  Patient presents with   Diabetes   Hypertension    HPI Jacob Harrison is a 82 y.o. male presenting today for follow up of hypertension, hyperlipidemia, diabetes. Hypertension: Patient here for follow-up of elevated blood pressure. He is not exercising and is adherent to low salt diet.   Pt denies chest pain, SOB, dizziness, edema, syncope, fatigue or heart palpitations. Taking losartan, carvedilol, furosemide, reports excellent compliance with treatment. Denies side effects.  He is also followed by cardiology, most recent appointment 08/05/2022.  They recommended continuing furosemide 80 mg daily in the morning and 40 mg daily in the afternoon.  Recommended to continue ambulatory blood pressure monitoring and alert them if blood pressures consistently less than 90/45. Hyperlipidemia: not currently taking any statin medications due to development of rhabdomyolysis and hospitalization.  Cardiology is also following closely, will avoid statin medications. The ASCVD Risk score (Arnett DK, et al., 2019) failed to calculate for the following reasons:   The patient has a prior MI or stroke diagnosis Diabetes: denies hypoglycemic events, wounds or sores that are not healing well, increased thirst or urination. Denies vision problems, eye exam completed 07/28/2022. Taking Jardiance as prescribed without any side effects.  Of note, Jacob Harrison was initially prescribed by cardiology for kidney health.  ROS Negative unless otherwise noted in HPI   Objective:     BP (!) 85/56 (BP Location: Left Arm, Patient Position: Sitting, Cuff Size: Normal)   Pulse 68   Resp 18   Ht 5\' 7"  (1.702 m)   Wt 157 lb (71.2 kg)   SpO2 97%   BMI 24.59 kg/m   Physical Exam Constitutional:      General: He is not in acute distress.    Appearance: Normal  appearance.  HENT:     Head: Normocephalic and atraumatic.  Cardiovascular:     Rate and Rhythm: Normal rate and regular rhythm.     Pulses: Normal pulses.     Heart sounds: Normal heart sounds. No murmur heard.    No friction rub. No gallop.  Pulmonary:     Effort: Pulmonary effort is normal.     Breath sounds: Normal breath sounds. No wheezing, rhonchi or rales.  Skin:    General: Skin is warm and dry.  Neurological:     Mental Status: He is alert and oriented to person, place, and time.  Psychiatric:        Mood and Affect: Mood normal.    Diabetic Foot Form - Detailed   Diabetic Foot Exam - detailed Diabetic Foot exam was performed with the following findings: Yes 08/19/2022 10:34 AM  Visual Foot Exam completed.: Yes  Can the patient see the bottom of their feet?: Yes Are the shoes appropriate in style and fit?: Yes Is there swelling or and abnormal foot shape?: No Is there a claw toe deformity?: No Is there elevated skin temparature?: No Is there foot or ankle muscle weakness?: No Normal Range of Motion: Yes Pulse Foot Exam completed.: Yes   Right posterior Tibialias: Present Left posterior Tibialias: Present   Right Dorsalis Pedis: Present Left Dorsalis Pedis: Present  Sensory Foot Exam Completed.: Yes Semmes-Weinstein Monofilament Test R Site 1-Great Toe: Neg L Site 1-Great Toe: Pos     Comments: Decrease in sensation on the lateral portions of feet.  Encouraged to continue  with good footcare and performing foot exams nightly.    Results for orders placed or performed in visit on 08/19/22  POCT HgB A1C  Result Value Ref Range   Hemoglobin A1C     HbA1c POC (<> result, manual entry) 5.0 4.0 - 5.6 %   HbA1c, POC (prediabetic range)     HbA1c, POC (controlled diabetic range)       Assessment & Plan:  Type 2 diabetes mellitus without complication, without long-term current use of insulin (HCC) Assessment & Plan: A1c today 5.0.  Adding Jardiance to medication  routine has likely contributed to lower A1c.  Encouraged to check blood sugars at home especially to ensure that he is not developing any low blood sugars.  Will continue to monitor.  Orders: -     POCT glycosylated hemoglobin (Hb A1C)  Essential hypertension Assessment & Plan: Blood pressure low today in office and on repeat.  Cardiology recommended continuing furosemide 80 mg daily in the morning and 40 mg daily in the evening until edema resolves, recommended patient to contact their office if blood pressure consistently less than 90/45.  Patient did endorse that he has not had much to drink this morning and may be dehydrated.  Encouraged him to drink a lot of water and recheck his blood pressure at home.  If still low, contact cardiology.  Patient verbalized understanding and is agreeable with this plan. Otherwise, continue losartan 12.5 mg daily, carvedilol 6.25 mg twice daily, furosemide 80 mg every morning and 40 mg every evening.  Continue ambulatory BP monitoring.  Most recent CMP essentially within normal limits.   Hyperlipidemia associated with type 2 diabetes mellitus (HCC) Assessment & Plan: Patient should never take any statin medication ever again.  Currently managing with nutrition, plans on beginning PT after cataract surgery to regain muscle strength and get back into a physical activity routine.  Will continue to monitor along with cardiology.     Reviewed office visit notes from cardiology 07/31/2022 and 08/05/2022.  CMP within normal limits.  Anemia on CBC still present but continues to improve.  Return in about 3 months (around 11/19/2022) for follow-up for HTN, HLD, DM;, fasting blood work 1 week before if not done by cardiology.    [name], PA

## 2022-08-19 NOTE — Assessment & Plan Note (Addendum)
Blood pressure low today in office and on repeat.  Cardiology recommended continuing furosemide 80 mg daily in the morning and 40 mg daily in the evening until edema resolves, recommended patient to contact their office if blood pressure consistently less than 90/45.  Patient did endorse that he has not had much to drink this morning and may be dehydrated.  Encouraged him to drink a lot of water and recheck his blood pressure at home.  If still low, contact cardiology.  Patient verbalized understanding and is agreeable with this plan. Otherwise, continue losartan 12.5 mg daily, carvedilol 6.25 mg twice daily, furosemide 80 mg every morning and 40 mg every evening.  Continue ambulatory BP monitoring.  Most recent CMP essentially within normal limits.

## 2022-08-20 DIAGNOSIS — H2512 Age-related nuclear cataract, left eye: Secondary | ICD-10-CM | POA: Diagnosis not present

## 2022-08-20 DIAGNOSIS — H25812 Combined forms of age-related cataract, left eye: Secondary | ICD-10-CM | POA: Diagnosis not present

## 2022-08-22 ENCOUNTER — Ambulatory Visit: Payer: Medicare HMO | Admitting: Internal Medicine

## 2022-08-22 ENCOUNTER — Encounter: Payer: Self-pay | Admitting: Internal Medicine

## 2022-08-22 VITALS — BP 108/60 | HR 76 | Temp 97.7°F | Ht 67.0 in | Wt 156.6 lb

## 2022-08-22 DIAGNOSIS — R0609 Other forms of dyspnea: Secondary | ICD-10-CM

## 2022-08-22 NOTE — Progress Notes (Unsigned)
Jacob Harrison, male    DOB: 07-23-1940   MRN: [id number]   Brief patient profile:  28  yowm grew up with lots of smoke exp and ear infections but got it once got out of house no trouble referred to pulmonary clinic in [facility]  06/13/2022 by Dr End  for unexplained doe/cough in setting of lisinopril cough hx.       History of Present Illness  06/13/2022  Pulmonary/ 1st office eval/ Jacob Harrison / [facility]  Chief Complaint  Patient presents with   Consult    SOB when exercising. Going on for 3-4 months. Dry cough and congestion in the last 2 weeks.   Despite two bypasses and a dx chf got better to point of able recumbent bike 30 min 3 x per week and walking x 15-20 min q am s sob until 3-4 m  prior to OV  >  by time of ov only walks 5 min  Dyspnea:  much worse since nasal congestion /cough x 2 weeks prior to OV  rx otc  Cough: clear some worse at hs  Sleep: cough immediately on lying down 45 degrees then uses cough drop  SABA use: none  Rec To get the most out of exercise, you need to be continuously aware that you are short of breath, but never out of breath, for at least 30 minutes daily.   Make sure you check your oxygen saturations at highest level of activity  If not improving after 4 weeks will need to consider a CPST in [place]  Pantoprazole (protonix) 40 mg   Take  30-60 min before first meal of the day and Pepcid (famotidine)  20 mg after supper until return to office GERD diet reviewed, bed blocks rec     Allergy screen 06/13/22 >  Eos 0. 2/  IgE  4  08/22/2022  f/u ov/Jacob Harrison/ [facility] re: cough/ sob ? Etioloty    maint on gerd rx   Chief Complaint  Patient presents with   Follow-up    No SOB, wheezing or cough. Breathing is fine.  Dyspnea:  limited by legs weak p rhabdomyolysis from crestor  Cough: resolved Sleeping: bed is flat one pillow  SABA use: none  02: none     No obvious day to day or daytime variability or assoc excess/ purulent sputum or  mucus plugs or hemoptysis or cp or chest tightness, subjective wheeze or overt sinus or hb symptoms.   Sleeping  without nocturnal  or early am exacerbation  of respiratory  c/o's or need for noct saba. Also denies any obvious fluctuation of symptoms with weather or environmental changes or other aggravating or alleviating factors except as outlined above   No unusual exposure hx or h/o childhood pna/ asthma or knowledge of premature birth.  Current Allergies, Complete Past Medical History, Past Surgical History, Family History, and Social History were reviewed in [employer] record.  ROS  The following are not active complaints unless bolded Hoarseness, sore throat, dysphagia, dental problems, itching, sneezing,  nasal congestion or discharge of excess mucus or purulent secretions, ear ache,   fever, chills, sweats, unintended wt loss or wt gain, classically pleuritic or exertional cp,  orthopnea pnd or arm/hand swelling  or leg swelling, presyncope, palpitations, abdominal pain, anorexia, nausea, vomiting, diarrhea  or change in bowel habits or change in bladder habits, change in stools or change in urine, dysuria, hematuria,  rash, arthralgias, visual complaints, headache, numbness,  weakness, generalized but esp legs with still some myalgias but improving slowly off crestor or ataxia or problems with walking or coordination,  change in mood or  memory.        Current Meds  Medication Sig   acetaminophen (TYLENOL) 500 MG tablet Take 1,000 mg by mouth every 6 (six) hours as needed for moderate pain or headache.   aspirin EC 81 MG tablet Take 1 tablet (81 mg total) by mouth daily.   carvedilol (COREG) 12.5 MG tablet Take 0.5 tablets (6.25 mg total) by mouth 2 (two) times daily with a meal.   Cholecalciferol (VITAMIN D-3 PO) Take 800 Units by mouth daily.   clopidogrel (PLAVIX) 75 MG tablet Take 75 mg by mouth daily.   cyanocobalamin 500 MCG tablet Take 500 mcg by mouth  daily.   empagliflozin (JARDIANCE) 10 MG TABS tablet Take 1 tablet (10 mg total) by mouth daily before breakfast.   famotidine (PEPCID) 20 MG tablet One after supper   finasteride (PROSCAR) 5 MG tablet TAKE 1 TABLET BY MOUTH EVERY DAY   furosemide (LASIX) 40 MG tablet Take 2 tablets (80 mg total) by mouth every morning AND 1 tablet (40 mg total) every evening.   Glucosamine-Chondroit-Vit C-Mn (GLUCOSAMINE 1500 COMPLEX) CAPS Take 1 capsule by mouth daily.   isosorbide mononitrate (IMDUR) 30 MG 24 hr tablet Take 1 tablet (30 mg total) by mouth 2 (two) times daily.   Lancets (ONETOUCH ULTRASOFT) lancets Use to check blood sugars fasting daily   losartan (COZAAR) 25 MG tablet Take 12.5 mg by mouth daily.   Multiple Vitamins-Minerals (PRESERVISION AREDS 2 PO) Take 1 capsule by mouth 2 (two) times a day.   nitroGLYCERIN (NITROSTAT) 0.4 MG SL tablet PLACE 1 TABLET UNDER THE TONGUE EVERY 5 MINUTES AS NEEDED.   pantoprazole (PROTONIX) 40 MG tablet Take 1 tablet (40 mg total) by mouth daily. Take 30-60 min before first meal of the day   ranolazine (RANEXA) 1000 MG SR tablet TAKE 1 TABLET BY MOUTH TWICE A DAY   triamcinolone cream (KENALOG) 0.1 % Apply 1 Application topically as directed. Qd up to 5 days per week to itchy rash on body until clear, avoid face, groin, axilla          Past Medical History:  Diagnosis Date   (HFpEF) heart failure with preserved ejection fraction (HCC)    a. 10/2016 Echo: EF 65-70%, mild LVH, mildly dil LA. RV fxn nl.   Arthritis    BPH (benign prostatic hyperplasia)    Coronary artery disease    a. 2001 CABG x 3 [facility]): LIMA->LAD, VG->LCX, VG->RPDA; b. 04/05/2010 Cath ([place]): LM 28m, LAD 135m, LCX 77m, RCA 100p, LIMA->LAD atretic, VG->LCX 100, VG->RCA 80ost, 70d; c. 04/2010 Redo CABG x 2 ([place]): VG->LAD, VG->RPDA; d. 10/2012 Cath [facility]): LM 20-30, LAD 100/95-7m, LCX 40-23m, RCA 100p, 80-100d, RPDA 99, RPL1 95, VG->LAD ok, VG->RPDA ok, EF 67%; e. 11/2016 MV: EF 48%,  No ischemia.   Diabetes mellitus without complication (HCC)    Type II   Hyperlipidemia    Hypertension    Ischemic cardiomyopathy    a. 03/2021 s/p MDT Visia AF MRI VR SureScan single lead AICD (ser# [id number]).   Scoliosis    Spinal stenosis          Objective:    Wts  08/22/2022       156  08/19/22 157 lb (71.2 kg)  08/05/22 162 lb (73.5 kg)  07/31/22 171 lb 4 oz (77.7 kg)  Vital signs reviewed  08/22/2022  - Note at rest 02 sats  97% on RA   General appearance:    slt hoarse amb wm walks with cane     HEENT : Oropharynx  clear      Nasal turbinates nl   NECK :  without  apparent JVD/ palpable Nodes/TM    LUNGS: no acc muscle use,  scoliotic contour chest with slightly decreased BS L  base without cough on insp or exp maneuvers   CV:  RRR  no s3 or murmur or increase in P2, and trace pitting R > L LE   ABD:  soft and nontender with nl inspiratory excursion in the supine position. No bruits or organomegaly appreciated   MS:  Nl gait/ ext warm without deformities Or obvious joint restrictions  calf tenderness, cyanosis or clubbing    SKIN: warm and dry without lesions    NEURO:  alert, approp, nl sensorium with  no motor or cerebellar deficits apparent.           Assessment

## 2022-08-22 NOTE — Patient Instructions (Signed)
To get the most out of exercise, you need to be continuously aware that you are short of breath, but never out of breath, for at least 30 minutes daily. As you improve, it will actually be easier for you to do the same amount of exercise  in  30 minutes so always push to the level where you are short of breath.    Make sure you check your oxygen saturations at highest level of activity if you are short of breath  - your level was 96% and should definitely stay above 90%    If you are satisfied with your treatment plan,  let your doctor know and he/she can either refill your medications or you can return here when your prescription runs out.     If in any way you are not 100% satisfied,  please tell us.  If 100% better, tell your friends!  Pulmonary follow up is as needed

## 2022-08-23 ENCOUNTER — Encounter: Payer: Self-pay | Admitting: Internal Medicine

## 2022-08-23 NOTE — Assessment & Plan Note (Signed)
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 2/  IgE  4  At present he is convinced he is better though I cautioned him there is a chance he will note more DOE as his legs recover from rhabdo and he walks faster, farther - if so I would consider PFTs and HRCT to complete the w/u   As for the cough, resolved as well while on gerd rx and fine to try off to see if recurs in which case GI eval may be considered vs just keep on long term gerd rx keeping in mind Use of PPI is associated with improved survival time and with decreased radiologic fibrosis per King's study published in AJRCCM vol 184 p1390.  Dec 2011 and also may have other beneficial effects as per the latest review in [place] vol 193 p1345 Jun 20016.  This may not always be cause and effect, but given how universally underwhelming  (at least in terms of short term benefit)   and expensive  all the other  Drugs developed to day  have been for pf,   rec start  rx ppi / diet/ lifestyle modification and f/u with serial walking sats and lung volumes for now to put more points on the curve / establish firm baseline before considering additional measures.  Pulmonary f/u is prn          Each maintenance medication was reviewed in detail including emphasizing most importantly the difference between maintenance and prns and under what circumstances the prns are to be triggered using an action plan format where appropriate.  Total  time for H and P, chart review, counseling, and generating customized AVS unique to this office visit / same day charting = 21 min

## 2022-08-26 IMAGING — CR DG CHEST 2V
1 series · 2 of 2 positions shown · non-contrast
Comparison: Xray Chest 10/04/2020.  CT Chest 10/13/2018.

CLINICAL DATA: Patient complains of having a cough x 2 weeks.

EXAM:
CHEST - 2 VIEW

[Series 1: dg chest 2 view · 0.14mm/px · 2 of 2 slices shown]
[im 1/2]
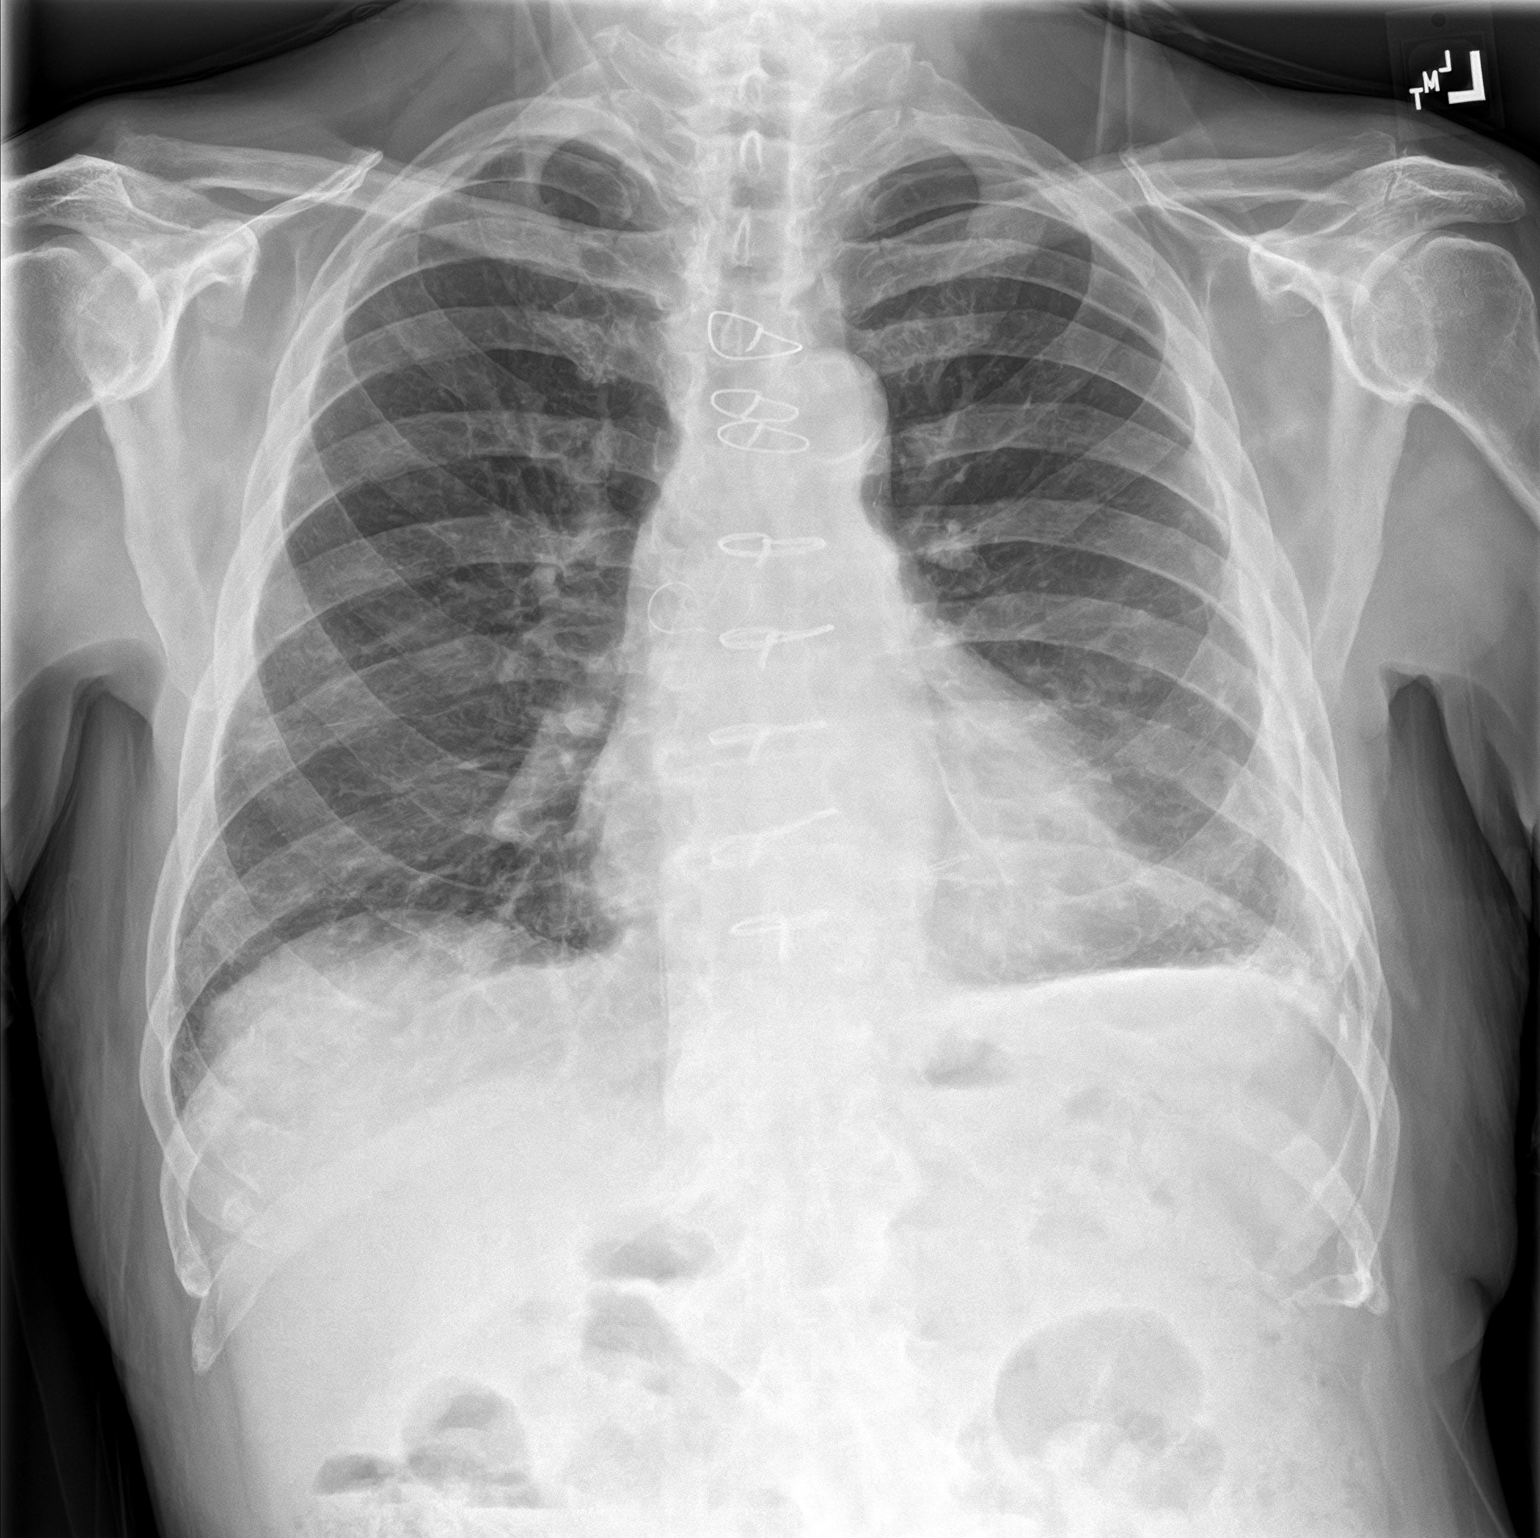
[im 2/2]
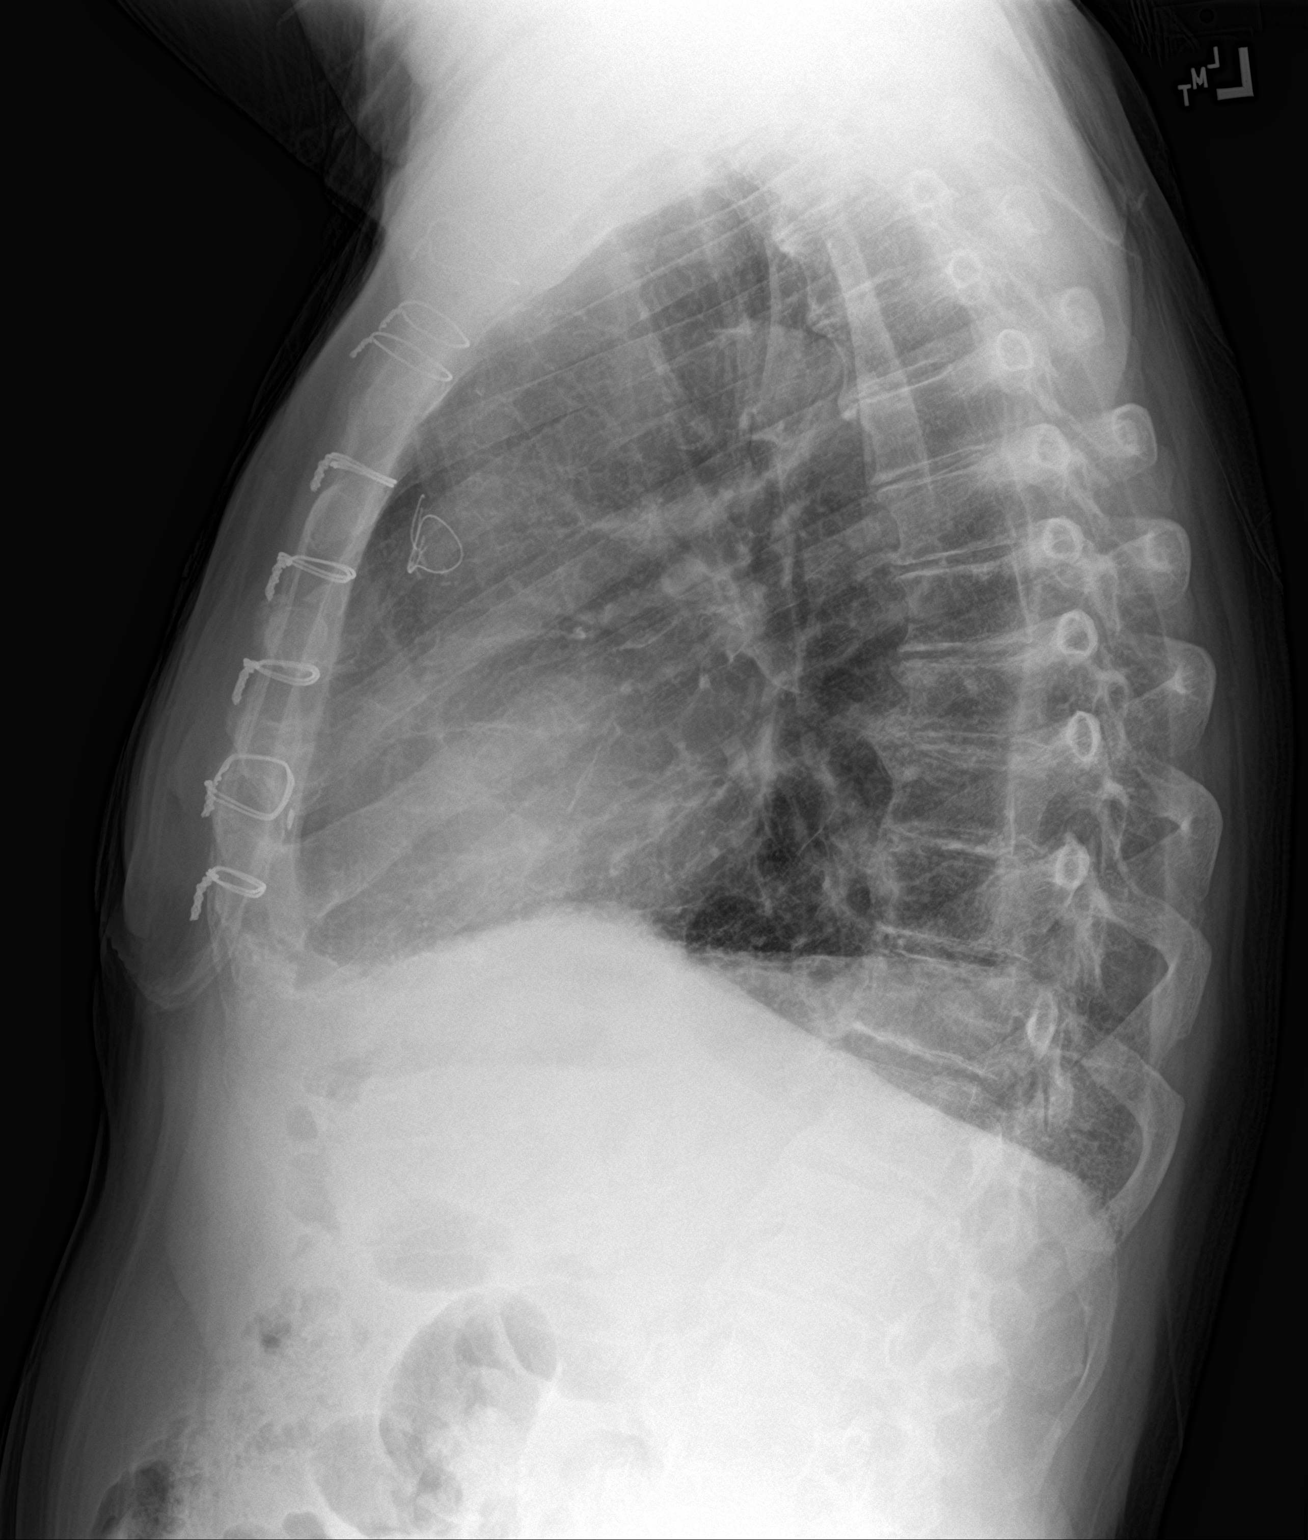

[2 of 2 positions shown; findings below may reference images not displayed]

FINDINGS: Signs of previous median sternotomy and CABG procedure. Aortic
atherosclerosis. Unchanged appearance of chronic pleuroparenchymal
scarring within the periphery of the left lung. Patchy airspace
densities are noted within the right lower lung.
IMPRESSION: 1. Patchy airspace densities within the right lower lobe concerning
for pneumonia.
2. Unchanged chronic pleuroparenchymal scarring within the periphery
of the left lung.

## 2022-08-27 ENCOUNTER — Ambulatory Visit (HOSPITAL_COMMUNITY)
Admission: RE | Admit: 2022-08-27 | Discharge: 2022-08-27 | Disposition: A | Discharge status: Home / Self Care | Payer: Medicare HMO | Source: Ambulatory Visit | Attending: Physician Assistant | Admitting: Physician Assistant

## 2022-08-27 VITALS — BP 104/70 | HR 73 | Ht 67.0 in | Wt 159.4 lb

## 2022-08-27 DIAGNOSIS — D6869 Other thrombophilia: Secondary | ICD-10-CM | POA: Insufficient documentation

## 2022-08-27 DIAGNOSIS — Z951 Presence of aortocoronary bypass graft: Secondary | ICD-10-CM | POA: Insufficient documentation

## 2022-08-27 DIAGNOSIS — I110 Hypertensive heart disease with heart failure: Secondary | ICD-10-CM | POA: Insufficient documentation

## 2022-08-27 DIAGNOSIS — I5022 Chronic systolic (congestive) heart failure: Secondary | ICD-10-CM | POA: Diagnosis not present

## 2022-08-27 DIAGNOSIS — I2510 Atherosclerotic heart disease of native coronary artery without angina pectoris: Secondary | ICD-10-CM | POA: Diagnosis not present

## 2022-08-27 DIAGNOSIS — I480 Paroxysmal atrial fibrillation: Secondary | ICD-10-CM | POA: Diagnosis not present

## 2022-08-27 DIAGNOSIS — Z7901 Long term (current) use of anticoagulants: Secondary | ICD-10-CM | POA: Insufficient documentation

## 2022-08-27 MED ORDER — APIXABAN 5 MG PO TABS: 5.0000 mg | ORAL_TABLET | Freq: Two times a day (BID) | ORAL | 3 refills | Status: DC

## 2022-08-27 NOTE — Progress Notes (Signed)
Primary Care Physician: [name], PA Primary Cardiologist: Dr End Primary Electrophysiologist: Dr Lalla Brothers Referring Physician: Dr Elizebeth Koller Jacob Harrison is a 82 y.o. male with a history of HFrEF, CAD s/p CABG x2, s/p ICD, DM, HLD, HTN, atrial fibrillation who presents for consultation in the [facility]. The device clinic received an alert 08/11/22 for an ongoing afib episode starting 08/09/22. Patient has a CHADS2VASC score of 6.  On follow up today, patient reports that he feels well today. He is in SR. He was unaware of his afib at the time. There were no specific triggers that he could identify.   Today, he denies symptoms of palpitations, chest pain, shortness of breath, orthopnea, PND, lower extremity edema, dizziness, presyncope, syncope, snoring, daytime somnolence, bleeding, or neurologic sequela. The patient is tolerating medications without difficulties and is otherwise without complaint today.    Atrial Fibrillation Risk Factors:  he does not have symptoms or diagnosis of sleep apnea. he does not have a history of rheumatic fever. he does not have a history of alcohol use.   he has a BMI of Body mass index is 24.97 kg/m.Marland Kitchen Filed Weights   08/27/22 1434  Weight: 72.3 kg    Family History  Problem Relation Age of Onset   Diabetes Mother    Hyperlipidemia Mother    Cancer Father        colon   Skin cancer Brother    Alcohol abuse Maternal Uncle    Diabetes Maternal Uncle    Hyperlipidemia Maternal Uncle    Cancer Paternal Uncle        colon     Atrial Fibrillation Management history:  Previous antiarrhythmic drugs: none Previous cardioversions: none Previous ablations: none Anticoagulation history: none   Past Medical History:  Diagnosis Date   (HFpEF) heart failure with preserved ejection fraction (HCC)    a. 10/2016 Echo: EF 65-70%, mild LVH, mildly dil LA. RV fxn nl.   Arthritis    BPH (benign prostatic  hyperplasia)    Coronary artery disease    a. 2001 CABG x 3 [facility]): LIMA->LAD, VG->LCX, VG->RPDA; b. 04/05/2010 Cath ([facility]): LM 63m, LAD 180m, LCX 8m, RCA 100p, LIMA->LAD atretic, VG->LCX 100, VG->RCA 80ost, 70d; c. 04/2010 Redo CABG x 2 ([place]): VG->LAD, VG->RPDA; d. 10/2012 Cath [facility]): LM 20-30, LAD 100/95-33m, LCX 40-24m, RCA 100p, 80-100d, RPDA 99, RPL1 95, VG->LAD ok, VG->RPDA ok, EF 67%; e. 11/2016 MV: EF 48%, No ischemia.   Diabetes mellitus without complication (HCC)    Type II   Hyperlipidemia    Hypertension    Ischemic cardiomyopathy    a. 03/2021 s/p MDT Visia AF MRI VR SureScan single lead AICD (ser# [id number]).   Scoliosis    Spinal stenosis    Past Surgical History:  Procedure Laterality Date   CARDIAC CATHETERIZATION     CORONARY ARTERY BYPASS GRAFT     x 2 (2001 and redo in late 2011 or early 2012)   HERNIA REPAIR Left    inguinal   ICD IMPLANT N/A 03/20/2021   Procedure: ICD IMPLANT;  Surgeon: [name], MD;  Location: [facility] INVASIVE CV LAB;  Service: Cardiovascular;  Laterality: N/A;   LEFT HEART CATH AND CORS/GRAFTS ANGIOGRAPHY N/A 10/04/2020   Procedure: LEFT HEART CATH AND CORS/GRAFTS ANGIOGRAPHY;  Surgeon: [name], MD;  Location: [facility] INVASIVE CV LAB;  Service: Cardiovascular;  Laterality: N/A;   LUMBAR LAMINECTOMY/DECOMPRESSION MICRODISCECTOMY N/A 10/20/2018   Procedure: L3-4, L4-5  LUMBAR DECOMPRESSION with dural repair.;  Surgeon: [name], MD;  Location: [facility];  Service: Orthopedics;  Laterality: N/A;   RIGHT/LEFT HEART CATH AND CORONARY/GRAFT ANGIOGRAPHY N/A 05/27/2022   Procedure: RIGHT/LEFT HEART CATH AND CORONARY/GRAFT ANGIOGRAPHY;  Surgeon: [name], MD;  Location: [facility] INVASIVE CV LAB;  Service: Cardiovascular;  Laterality: N/A;   TONSILLECTOMY AND ADENOIDECTOMY     TRANSURETHRAL RESECTION OF PROSTATE  1990    Current Outpatient Medications  Medication Sig Dispense Refill   acetaminophen (TYLENOL) 500 MG tablet Take  1,000 mg by mouth as needed for moderate pain or headache.     aspirin EC 81 MG tablet Take 1 tablet (81 mg total) by mouth daily.     carvedilol (COREG) 12.5 MG tablet Take 0.5 tablets (6.25 mg total) by mouth 2 (two) times daily with a meal. 180 tablet 3   Cholecalciferol (VITAMIN D-3 PO) Take 800 Units by mouth daily.     clopidogrel (PLAVIX) 75 MG tablet Take 75 mg by mouth daily.     cyanocobalamin 500 MCG tablet Take 500 mcg by mouth daily.     empagliflozin (JARDIANCE) 10 MG TABS tablet Take 1 tablet (10 mg total) by mouth daily before breakfast. 90 tablet 0   finasteride (PROSCAR) 5 MG tablet TAKE 1 TABLET BY MOUTH EVERY DAY 90 tablet 1   furosemide (LASIX) 40 MG tablet Take 2 tablets (80 mg total) by mouth every morning AND 1 tablet (40 mg total) every evening. (Patient taking differently: Take 2 tablets (80 mg total) by mouth every morning) 270 tablet 3   Glucosamine-Chondroit-Vit C-Mn (GLUCOSAMINE 1500 COMPLEX) CAPS Take 1 capsule by mouth daily.     isosorbide mononitrate (IMDUR) 30 MG 24 hr tablet Take 1 tablet (30 mg total) by mouth 2 (two) times daily. 180 tablet 3   Lancets (ONETOUCH ULTRASOFT) lancets Use to check blood sugars fasting daily 100 each 12   losartan (COZAAR) 25 MG tablet Take 12.5 mg by mouth daily.     Multiple Vitamins-Minerals (PRESERVISION AREDS 2 PO) Take 1 capsule by mouth 2 (two) times a day.     nitroGLYCERIN (NITROSTAT) 0.4 MG SL tablet PLACE 1 TABLET UNDER THE TONGUE EVERY 5 MINUTES AS NEEDED. 25 tablet 0   ranolazine (RANEXA) 1000 MG SR tablet TAKE 1 TABLET BY MOUTH TWICE A DAY 180 tablet 0   triamcinolone cream (KENALOG) 0.1 % Apply 1 Application topically as directed. Qd up to 5 days per week to itchy rash on body until clear, avoid face, groin, axilla 80 g 1   No current facility-administered medications for this encounter.    Allergies  Allergen Reactions   Statins Other (See Comments)    Rhabdo in combination with Terbinafine    Other Itching  and Rash    Steroid injection in 2021 caused rash/itching   Tetracyclines & Related Rash    Social History   Socioeconomic History   Marital status: Married    Spouse name: Glenda   Number of children: 2   Years of education: Not on file   Highest education level: Associate degree: occupational, Scientist, product/process development, or vocational program  Occupational History   Occupation: retired  Tobacco Use   Smoking status: Never    Passive exposure: Never   Smokeless tobacco: Never  Vaping Use   Vaping Use: Never used  Substance and Sexual Activity   Alcohol use: Not Currently    Comment: 1-2 drinks on occasion previously 5-7 drinks a week (was 10-15 drinks/week)  Drug use: No   Sexual activity: Not Currently    Birth control/protection: None  Other Topics Concern   Not on file  Social History Narrative   Not on file   Social Determinants of Health   Financial Resource Strain: Low Risk  (07/31/2022)   Overall Financial Resource Strain (CARDIA)    Difficulty of Paying Living Expenses: Not hard at all  Food Insecurity: No Food Insecurity (07/31/2022)   Hunger Vital Sign    Worried About Running Out of Food in the Last Year: Never true    Ran Out of Food in the Last Year: Never true  Transportation Needs: No Transportation Needs (07/31/2022)   PRAPARE - Administrator, Civil Service (Medical): No    Lack of Transportation (Non-Medical): No  Physical Activity: Insufficiently Active (07/31/2022)   Exercise Vital Sign    Days of Exercise per Week: 7 days    Minutes of Exercise per Session: 10 min  Stress: No Stress Concern Present (07/31/2022)   [employer] of Occupational Health - Occupational Stress Questionnaire    Feeling of Stress : Not at all  Social Connections: Socially Integrated (07/31/2022)   Social Connection and Isolation Panel [NHANES]    Frequency of Communication with Friends and Family: More than three times a week    Frequency of Social Gatherings with Friends  and Family: More than three times a week    Attends Religious Services: More than 4 times per year    Active Member of [employer] or Organizations: Yes    Attends Engineer, structural: More than 4 times per year    Marital Status: Married  Catering manager Violence: Not At Risk (07/31/2022)   Humiliation, Afraid, Rape, and Kick questionnaire    Fear of Current or Ex-Partner: No    Emotionally Abused: No    Physically Abused: No    Sexually Abused: No     ROS- All systems are reviewed and negative except as per the HPI above.  Physical Exam: Vitals:   08/27/22 1434  Weight: 72.3 kg  Height: 5\' 7"  (1.702 m)    GEN- The patient is a well appearing elderly male, alert and oriented x 3 today.   Head- normocephalic, atraumatic Eyes-  Sclera clear, conjunctiva pink Ears- hearing intact Oropharynx- clear Neck- supple  Lungs- Clear to ausculation bilaterally, normal work of breathing Heart- Regular rate and rhythm, no murmurs, rubs or gallops  GI- soft, NT, ND, + BS Extremities- no clubbing, cyanosis, or edema MS- no significant deformity or atrophy Skin- no rash or lesion Psych- euthymic mood, full affect Neuro- strength and sensation are intact  Wt Readings from Last 3 Encounters:  08/27/22 72.3 kg  08/22/22 71 kg  08/19/22 71.2 kg    EKG today demonstrates  SR, 1st degree AV block Vent. rate 73 BPM PR interval 208 ms QRS duration 96 ms QT/QTcB 386/425 ms  Echo 04/21/22 demonstrated  1. Left ventricular ejection fraction, by estimation, is 30 to 35%. The  left ventricle has moderate to severely decreased function. The left  ventricle demonstrates global hypokinesis. There is mild left ventricular  hypertrophy of the basal-septal segment. Left ventricular diastolic parameters are indeterminate.   2. Right ventricular systolic function is low normal. The right  ventricular size is mildly enlarged.   3. Left atrial size was mildly dilated.   4. The mitral valve is  normal in structure. Mild mitral valve  regurgitation.   5. The aortic valve is tricuspid.  Aortic valve regurgitation is not  visualized. Mild aortic valve stenosis. Aortic valve mean gradient  measures 11.0 mmHg.   6. Aortic dilatation noted. There is mild dilatation of the ascending  aorta, measuring 39 mm.   7. The inferior vena cava is normal in size with greater than 50%  respiratory variability, suggesting right atrial pressure of 3 mmHg.   Comparison(s): Limited TTE (02/20/2021): LVEF 30-35%. Mild mitral  regurgitation. Aortic sclerosis without stenosis.   Epic records are reviewed at length today.  CHA2DS2-VASc Score = 6  The patient's score is based upon: CHF History: 1 HTN History: 1 Diabetes History: 1 Stroke History: 0 Vascular Disease History: 1 Age Score: 2 Gender Score: 0       ASSESSMENT AND PLAN: 1. Paroxysmal Atrial Fibrillation (ICD10:  I48.0) The patient's CHA2DS2-VASc score is 6, indicating a 9.7% annual risk of stroke.   General education about afib provided and questions answered. We also discussed his stroke risk and the risks and benefits of anticoagulation. Patient in SR today. Will continue to track burden on ICD. Will start Eliquis 5 mg BID. Recent lab work reviewed.  Would recommend repeat CBC on follow up.  Patient is on DAPT with his coronary disease. D/w Dr End, will stop Plavix and continue ASA with addition of Eliquis.  Continue carvedilol 6.25 mg BID  2. Secondary Hypercoagulable State (ICD10:  D68.69) The patient is at significant risk for stroke/thromboembolism based upon his CHA2DS2-VASc Score of 6.  Start Apixaban (Eliquis).   3. CAD S/p CABG On Imdur and Ranexa No anginal symptoms today.  4. Chronic HFrEF EF 30-35%, s/p ICD GDMT per Dr End. Fluid status appears stable today.  5. HTN Stable, no changes today   Follow up with [place] and Dr End as scheduled. EP per recall.    Jorja Loa PA-C [facility] [address] [place], [place] [address] [phone] 08/27/2022 2:48 PM

## 2022-09-02 ENCOUNTER — Encounter: Payer: Self-pay | Admitting: Internal Medicine

## 2022-09-05 ENCOUNTER — Encounter: Payer: Self-pay | Admitting: Internal Medicine

## 2022-09-05 DIAGNOSIS — I480 Paroxysmal atrial fibrillation: Secondary | ICD-10-CM

## 2022-09-05 MED ORDER — APIXABAN 5 MG PO TABS
5.0000 mg | ORAL_TABLET | Freq: Two times a day (BID) | ORAL | 1 refills | Status: DC
Start: 2022-09-05 — End: 2023-06-24

## 2022-09-05 NOTE — Telephone Encounter (Signed)
Prescription refill request for Eliquis received. Indication: Afib  Last office visit: 08/27/22 Charlean Merl)  Scr: 0.75 (08/14/22)  Age: 82 Weight: 72.3kg  Appropriate dose. Refill sent.

## 2022-09-12 ENCOUNTER — Encounter: Payer: Self-pay | Admitting: Family Medicine

## 2022-09-12 DIAGNOSIS — N401 Enlarged prostate with lower urinary tract symptoms: Secondary | ICD-10-CM

## 2022-09-15 ENCOUNTER — Encounter: Payer: Self-pay | Admitting: Family Medicine

## 2022-09-15 ENCOUNTER — Ambulatory Visit: Payer: Medicare HMO | Attending: Medical | Admitting: Medical

## 2022-09-15 ENCOUNTER — Other Ambulatory Visit
Admission: RE | Admit: 2022-09-15 | Discharge: 2022-09-15 | Disposition: A | Discharge status: Home / Self Care | Payer: Medicare HMO | Source: Ambulatory Visit | Attending: Medical | Admitting: Medical

## 2022-09-15 ENCOUNTER — Encounter: Payer: Self-pay | Admitting: Medical

## 2022-09-15 VITALS — BP 101/64 | HR 65 | Ht 67.0 in | Wt 158.4 lb

## 2022-09-15 DIAGNOSIS — I25118 Atherosclerotic heart disease of native coronary artery with other forms of angina pectoris: Secondary | ICD-10-CM

## 2022-09-15 DIAGNOSIS — I5022 Chronic systolic (congestive) heart failure: Secondary | ICD-10-CM | POA: Diagnosis not present

## 2022-09-15 DIAGNOSIS — I255 Ischemic cardiomyopathy: Secondary | ICD-10-CM | POA: Diagnosis not present

## 2022-09-15 DIAGNOSIS — E782 Mixed hyperlipidemia: Secondary | ICD-10-CM | POA: Insufficient documentation

## 2022-09-15 DIAGNOSIS — M2560 Stiffness of unspecified joint, not elsewhere classified: Secondary | ICD-10-CM

## 2022-09-15 DIAGNOSIS — Z95810 Presence of automatic (implantable) cardiac defibrillator: Secondary | ICD-10-CM

## 2022-09-15 DIAGNOSIS — R531 Weakness: Secondary | ICD-10-CM

## 2022-09-15 LAB — LIPID PANEL
Cholesterol: 241 mg/dL — ABNORMAL HIGH (ref 0–200)
HDL: 81 mg/dL (ref >40)
LDL Cholesterol: 140 mg/dL — ABNORMAL HIGH (ref 0–99)
Total CHOL/HDL Ratio: 3 RATIO
Triglycerides: 102 mg/dL (ref <150)
VLDL: 20 mg/dL (ref 0–40)

## 2022-09-15 LAB — LDL CHOLESTEROL, DIRECT: Direct LDL: 141 mg/dL — ABNORMAL HIGH (ref 0–99)

## 2022-09-15 MED ORDER — FINASTERIDE 5 MG PO TABS
5.0000 mg | ORAL_TABLET | Freq: Every day | ORAL | 1 refills | Status: DC
Start: 2022-09-15 — End: 2023-03-02

## 2022-09-15 NOTE — Progress Notes (Signed)
Cardiology Office Note:    Date:  09/15/2022   ID:  [name], DOB 1940/08/17, MRN [id number]  PCP:  [name], PA  [facility] Cardiologist:  [name], MD  [facility] Electrophysiologist:  [name], MD   Referring MD: [name], PA   Chief Complaint: 2 month follow-up  History of Present Illness:    Jacob Harrison is a 82 y.o. 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  Seen in November 2020 with bradycardia recommended to cut back on Carvedilol. A soft systolic murmur was noted and echocardiogram ordered. He was started on Lasix 20mg  daily for pretibial edema. Echo 09/23/19 with LVEF 60-65%, no RWMA, G1DD, RV normal size and function, moderate AV sclerosis without stenosis.     The patient had NSTEMI 09/2020 at which time cardiac cath showed LMCA with mild diffuse disease, LAD with moderate diffuse disease and chronic subtotal occlusion of the mid vessel. Distal LAD fills viz left to right collaterals. LCX with moderate mid vessel disease, RCA with CTO, filling L>R collaterals, SVG to RPDA,LAD, and OM with CTO, LIMA to LAD known to be atretic. LVEDP . Recommendations for medical therapy.    Seen 11/14/20 and denied angina. BP was soft. Recommended discontinuation of Imdur if EF was low on Echo. Echo showed LVEF 30-35%. Lasix was increased. He was seen 10/10 and had hypotension. Repeat echo was ordered, which showed persistently low EF 30-35%, and he was sent to EP for ICD consideration. He saw EP 03/06/21 for ICD consideration and underwent implantation 03/20/21.    Patient was last seen May 22, 2022 with progressive unstable angina and shortness of breath despite increase in Imdur. He reported some improvement  of symptoms.  Patient was ultimately set up for right and left heart cath. Right and left heart cath showed severe multivessel CAD, including chronic total occlusion of mid LAD and proximal RCA.  There was known moderate LMCA and left circumflex disease, overall appearance similar to prior cath in 2020.  Known occlusions of bypass grafts, occlusion of SVG to LAD and SVG to RCA.  He had normal left heart and right heart filling pressures.  Coreg was decreased to 6.25 mg twice daily given low BP, bradycardia and fatigue.  Patient was hospitalized in April 2024 due to muscle pains he notes with rhabdomyolysis due to concurrent statin therapy and recent addition of terbinafine for nonacute mycosis.  He developed edema in the setting of IV fluids and was instructed to take an extra Lasix.  Patient was last seen by Dr. Okey Dupre in April 2024 and was overall feeling better, though he continued to have weakness.  Lasix was increased to 80 mg every morning and 40 mg every afternoon until resolution of the edema.  Today, the patient reports he is taking lasix 60mg  in the am and sometimes 40mg  at night. Weight has gone down about 5 lbs over 2 months. He has improved muscle weakness. He would like a referral to PT. He denies chest pain. He has some SOB on exertion, worse after eating. He has trace edema on exam, it's normally normal in the morning. Repeat lipid panel today. He denies chest pain.  Past Medical History:  Diagnosis Date   (HFpEF) heart failure with preserved ejection fraction (  HCC)    a. 10/2016 Echo: EF 65-70%, mild LVH, mildly dil LA. RV fxn nl.   Arthritis    BPH (benign prostatic hyperplasia)    Coronary artery disease    a. 2001 CABG x 3 [facility]): LIMA->LAD, VG->LCX, VG->RPDA; b. 04/05/2010 Cath ([facility]): LM 87m, LAD 137m, LCX 50m, RCA 100p, LIMA->LAD atretic, VG->LCX 100, VG->RCA 80ost, 70d; c. 04/2010 Redo CABG x 2 ([place]): VG->LAD, VG->RPDA; d. 10/2012 Cath [facility]): LM 20-30, LAD 100/95-94m,  LCX 40-47m, RCA 100p, 80-100d, RPDA 99, RPL1 95, VG->LAD ok, VG->RPDA ok, EF 67%; e. 11/2016 MV: EF 48%, No ischemia.   Diabetes mellitus without complication (HCC)    Type II   Hyperlipidemia    Hypertension    Ischemic cardiomyopathy    a. 03/2021 s/p MDT Visia AF MRI VR SureScan single lead AICD (ser# [id number]).   Scoliosis    Spinal stenosis     Past Surgical History:  Procedure Laterality Date   CARDIAC CATHETERIZATION     CORONARY ARTERY BYPASS GRAFT     x 2 (2001 and redo in late 2011 or early 2012)   HERNIA REPAIR Left    inguinal   ICD IMPLANT N/A 03/20/2021   Procedure: ICD IMPLANT;  Surgeon: [name], MD;  Location: [facility] INVASIVE CV LAB;  Service: Cardiovascular;  Laterality: N/A;   LEFT HEART CATH AND CORS/GRAFTS ANGIOGRAPHY N/A 10/04/2020   Procedure: LEFT HEART CATH AND CORS/GRAFTS ANGIOGRAPHY;  Surgeon: [name], MD;  Location: [facility] INVASIVE CV LAB;  Service: Cardiovascular;  Laterality: N/A;   LUMBAR LAMINECTOMY/DECOMPRESSION MICRODISCECTOMY N/A 10/20/2018   Procedure: L3-4, L4-5 LUMBAR DECOMPRESSION with dural repair.;  Surgeon: [name], MD;  Location: [facility] OR;  Service: Orthopedics;  Laterality: N/A;   RIGHT/LEFT HEART CATH AND CORONARY/GRAFT ANGIOGRAPHY N/A 05/27/2022   Procedure: RIGHT/LEFT HEART CATH AND CORONARY/GRAFT ANGIOGRAPHY;  Surgeon: [name], MD;  Location: [facility] INVASIVE CV LAB;  Service: Cardiovascular;  Laterality: N/A;   TONSILLECTOMY AND ADENOIDECTOMY     TRANSURETHRAL RESECTION OF PROSTATE  1990    Current Medications: Current Meds  Medication Sig   acetaminophen (TYLENOL) 500 MG tablet Take 1,000 mg by mouth as needed for moderate pain or headache.   apixaban (ELIQUIS) 5 MG TABS tablet Take 1 tablet (5 mg total) by mouth 2 (two) times daily.   aspirin EC 81 MG tablet Take 1 tablet (81 mg total) by mouth daily.   carvedilol (COREG) 12.5 MG tablet Take 0.5 tablets (6.25 mg total) by mouth 2 (two) times daily with a meal.    Cholecalciferol (VITAMIN D-3 PO) Take 800 Units by mouth daily.   cyanocobalamin 500 MCG tablet Take 500 mcg by mouth daily.   empagliflozin (JARDIANCE) 10 MG TABS tablet Take 1 tablet (10 mg total) by mouth daily before breakfast.   finasteride (PROSCAR) 5 MG tablet Take 1 tablet (5 mg total) by mouth daily.   furosemide (LASIX) 40 MG tablet Take 2 tablets (80 mg total) by mouth every morning AND 1 tablet (40 mg total) every evening. (Patient taking differently: Take 1.5 tablets (60 mg total) by mouth every morning and 1 tablet in the evening)   Glucosamine-Chondroit-Vit C-Mn (GLUCOSAMINE 1500 COMPLEX) CAPS Take 1 capsule by mouth daily.   isosorbide mononitrate (IMDUR) 30 MG 24 hr tablet Take 1 tablet (30 mg total) by mouth 2 (two) times daily.   Lancets (ONETOUCH ULTRASOFT) lancets Use to check blood sugars fasting daily   losartan (COZAAR) 25 MG tablet Take 12.5 mg  by mouth daily.   Multiple Vitamins-Minerals (PRESERVISION AREDS 2 PO) Take 1 capsule by mouth 2 (two) times a day.   nitroGLYCERIN (NITROSTAT) 0.4 MG SL tablet PLACE 1 TABLET UNDER THE TONGUE EVERY 5 MINUTES AS NEEDED.   ranolazine (RANEXA) 1000 MG SR tablet TAKE 1 TABLET BY MOUTH TWICE A DAY   triamcinolone cream (KENALOG) 0.1 % Apply 1 Application topically as directed. Qd up to 5 days per week to itchy rash on body until clear, avoid face, groin, axilla     Allergies:   Statins, Other, and Tetracyclines & related   Social History   Socioeconomic History   Marital status: Married    Spouse name: Glenda   Number of children: 2   Years of education: Not on file   Highest education level: Associate degree: occupational, Scientist, product/process development, or vocational program  Occupational History   Occupation: retired  Tobacco Use   Smoking status: Never    Passive exposure: Never   Smokeless tobacco: Never  Vaping Use   Vaping Use: Never used  Substance and Sexual Activity   Alcohol use: Not Currently    Comment: 1-2 drinks on occasion  previously 5-7 drinks a week (was 10-15 drinks/week)   Drug use: No   Sexual activity: Not Currently    Birth control/protection: None  Other Topics Concern   Not on file  Social History Narrative   Not on file   Social Determinants of Health   Financial Resource Strain: Low Risk  (07/31/2022)   Overall Financial Resource Strain (CARDIA)    Difficulty of Paying Living Expenses: Not hard at all  Food Insecurity: No Food Insecurity (07/31/2022)   Hunger Vital Sign    Worried About Running Out of Food in the Last Year: Never true    Ran Out of Food in the Last Year: Never true  Transportation Needs: No Transportation Needs (07/31/2022)   PRAPARE - Administrator, Civil Service (Medical): No    Lack of Transportation (Non-Medical): No  Physical Activity: Insufficiently Active (07/31/2022)   Exercise Vital Sign    Days of Exercise per Week: 7 days    Minutes of Exercise per Session: 10 min  Stress: No Stress Concern Present (07/31/2022)   [employer] of Occupational Health - Occupational Stress Questionnaire    Feeling of Stress : Not at all  Social Connections: Socially Integrated (07/31/2022)   Social Connection and Isolation Panel [NHANES]    Frequency of Communication with Friends and Family: More than three times a week    Frequency of Social Gatherings with Friends and Family: More than three times a week    Attends Religious Services: More than 4 times per year    Active Member of [employer] or Organizations: Yes    Attends Engineer, structural: More than 4 times per year    Marital Status: Married     Family History: The patient's family history includes Alcohol abuse in his maternal uncle; Cancer in his father and paternal uncle; Diabetes in his maternal uncle and mother; Hyperlipidemia in his maternal uncle and mother; Skin cancer in his brother.  ROS:   Please see the history of present illness.     All other systems reviewed and are  negative.  EKGs/Labs/Other Studies Reviewed:    The following studies were reviewed today:  Cardiac cath 05/2022 RIGHT/LEFT HEART CATH AND CORONARY/GRAFT ANGIOGRAPHY   Conclusion  Conclusions: Severe multivessel coronary artery disease, including chronic total occlusions of mid LAD and  proximal RCA.  There is moderate LMCA and LCx disease.  Overall appearance is similar to prior catheterization in 2020. Known occlusions of all bypass grafts; occlusions of SVG-LAD and SVG-RCA redemonstrated today. Normal left heart and right heart filling pressures. Normal Fick cardiac output/index.   Recommendations: Decease carvedilol to 6.25 mg daily given borderline low BP, bradycardia, and fatigue. Repeat CBC with diff to reassess leukocytosis. Consider CT chest for further evaluation of left lung base opacity on recent CXR and dyspnea. Aggressive secondary prevention of coronary artery disease.   [name], MD [facility]   Coronary Diagrams  Diagnostic Dominance: Right  Echo 04/2022 1. Left ventricular ejection fraction, by estimation, is 30 to 35%. The  left ventricle has moderate to severely decreased function. The left  ventricle demonstrates global hypokinesis. There is mild left ventricular  hypertrophy of the basal-septal  segment. Left ventricular diastolic parameters are indeterminate.   2. Right ventricular systolic function is low normal. The right  ventricular size is mildly enlarged.   3. Left atrial size was mildly dilated.   4. The mitral valve is normal in structure. Mild mitral valve  regurgitation.   5. The aortic valve is tricuspid. Aortic valve regurgitation is not  visualized. Mild aortic valve stenosis. Aortic valve mean gradient  measures 11.0 mmHg.   6. Aortic dilatation noted. There is mild dilatation of the ascending  aorta, measuring 39 mm.   7. The inferior vena cava is normal in size with greater than 50%  respiratory variability, suggesting  right atrial pressure of 3 mmHg.   Comparison(s): Limited TTE (02/20/2021): LVEF 30-35%. Mild mitral  regurgitation. Aortic sclerosis without stenosis.   Echo limited 02/2021   1. Left ventricular ejection fraction, by estimation, is 30 to 35%. The  left ventricle has moderate to severely decreased function.   2. Mild mitral valve regurgitation.   3. The aortic valve is tricuspid. Aortic valve regurgitation is not  visualized. Aortic valve sclerosis/calcification is present, without any  evidence of aortic stenosis.   EKG:  EKG is ordered today.  The ekg ordered today demonstrates NSR 65bpm, nonspecific T wave changes  Recent Labs: 05/22/2022: B Natriuretic Peptide 107.1 07/05/2022: TSH 2.307 07/30/2022: ALT 19; Hemoglobin 12.0; Platelets 341 08/14/2022: BUN 19; Creatinine, Ser 0.75; Potassium 4.5; Sodium 136  Recent Lipid Panel    Component Value Date/Time   CHOL 126 07/07/2022 1041   CHOL 139 02/17/2022 1018   TRIG 118 07/07/2022 1041   HDL 37 (L) 07/07/2022 1041   HDL 73 02/17/2022 1018   CHOLHDL 3.4 07/07/2022 1041   VLDL 24 07/07/2022 1041   LDLCALC 65 07/07/2022 1041   LDLCALC 49 02/17/2022 1018    Physical Exam:    VS:  BP 101/64 (BP Location: Left Arm, Patient Position: Sitting, Cuff Size: Normal)   Pulse 65   Ht 5\' 7"  (1.702 m)   Wt 158 lb 6.4 oz (71.8 kg)   SpO2 96%   BMI 24.81 kg/m     Wt Readings from Last 3 Encounters:  09/15/22 158 lb 6.4 oz (71.8 kg)  08/27/22 159 lb 6.4 oz (72.3 kg)  08/22/22 156 lb 9.6 oz (71 kg)     GEN:  Well nourished, well developed in no acute distress HEENT: Normal NECK: No JVD; No carotid bruits LYMPHATICS: No lymphadenopathy CARDIAC:RRR, no murmurs, rubs, gallops RESPIRATORY:  Clear to auscultation without rales, wheezing or rhonchi  ABDOMEN: Soft, non-tender, non-distended MUSCULOSKELETAL:  trace lower leg edema; No deformity  SKIN: Warm  and dry NEUROLOGIC:  Alert and oriented x 3 PSYCHIATRIC:  Normal affect    ASSESSMENT:    1. Coronary artery disease of native heart with stable angina pectoris, unspecified vessel or lesion type (HCC)   2. Chronic systolic heart failure (HCC)   3. ICD (implantable cardioverter-defibrillator) in place   4. Ischemic cardiomyopathy   5. Hyperlipidemia, mixed    PLAN:    In order of problems listed above:  Chronic HFrEF ICM LVEF 30-35% Patient has trace lower leg edema on exam. He is taking lasix 60mg  in the am and 40mg  in the PM as needed for lower leg swelling. Overall he is doing well. He has some minor SOB since hospitalization for rhabdomyolysis. Continue Coreg, Jardiance, and Losartan. Soft BT limits further titration of GDMT.  CAD No chest pain reported. Most recent Cath 05/2022 with unchanged disease. Continue Aspirin, Plavix, Ranexa, Imdur, Coreg, and SL NTG.   HLD Rhabdomyolysis Patient had prior hospitalization for rhabdomyolysis from statin/zetia. The patient has been off statin/Zetia. I will recheck a lipid panel today. Will consider [id number] pending labs. He will ask PCP for referral to PT.   Disposition: Follow up in 3 month(s) with MD/APP    Signed, Annisten Manchester David Stall, PA-C  09/15/2022 1:55 PM    [facility]

## 2022-09-15 NOTE — Patient Instructions (Signed)
Medication Instructions:  Continue taking medications as prescribed. *If you need a refill on your cardiac medications before your next appointment, please call your pharmacy*   Lab Work: Your provider would like for you to have following labs drawn: Lipid panel and direct LDL.   Please go to the [facility] entrance and check in at the front desk.  You do not need an appointment.  They are open from 7am-6 pm.   If you have labs (blood work) drawn today and your tests are completely normal, you will receive your results only by: MyChart Message (if you have MyChart) OR A paper copy in the mail If you have any lab test that is abnormal or we need to change your treatment, we will call you to review the results.   Testing/Procedures: none   Follow-Up: At [facility], you and your health needs are our priority.  As part of our continuing mission to provide you with exceptional heart care, we have created designated Provider Care Teams.  These Care Teams include your primary Cardiologist (physician) and Advanced Practice Providers (APPs -  Physician Assistants and Nurse Practitioners) who all work together to provide you with the care you need, when you need it.  We recommend signing up for the patient portal called "MyChart".  Sign up information is provided on this After Visit Summary.  MyChart is used to connect with patients for Virtual Visits (Telemedicine).  Patients are able to view lab/test results, encounter notes, upcoming appointments, etc.  Non-urgent messages can be sent to your provider as well.   To learn more about what you can do with MyChart, go to [email or web address].    Your next appointment:   3 month(s)  Provider:   You may see [name], MD or one of the following Advanced Practice Providers on your designated Care Team:   Nicolasa Ducking, NP Eula Listen, PA-C Cadence Fransico Michael, PA-C Charlsie Quest, NP

## 2022-09-17 ENCOUNTER — Other Ambulatory Visit: Payer: Self-pay | Admitting: *Deleted

## 2022-09-17 ENCOUNTER — Ambulatory Visit (INDEPENDENT_AMBULATORY_CARE_PROVIDER_SITE_OTHER): Payer: Medicare HMO

## 2022-09-17 DIAGNOSIS — I255 Ischemic cardiomyopathy: Secondary | ICD-10-CM | POA: Diagnosis not present

## 2022-09-17 DIAGNOSIS — E782 Mixed hyperlipidemia: Secondary | ICD-10-CM

## 2022-09-19 LAB — CUP PACEART REMOTE DEVICE CHECK
Battery Remaining Longevity: 121 mo
Battery Voltage: 3.03 V
Brady Statistic RV Percent Paced: 0.09 %
Date Time Interrogation Session: 20240612022703
HighPow Impedance: 68 Ohm
Implantable Lead Connection Status: 753985
Implantable Lead Implant Date: 20221214
Implantable Lead Location: 753860
Implantable Pulse Generator Implant Date: 20221214
Lead Channel Impedance Value: 304 Ohm
Lead Channel Impedance Value: 380 Ohm
Lead Channel Pacing Threshold Amplitude: 0.75 V
Lead Channel Pacing Threshold Pulse Width: 0.4 ms
Lead Channel Sensing Intrinsic Amplitude: 14.25 mV
Lead Channel Sensing Intrinsic Amplitude: 14.25 mV
Lead Channel Setting Pacing Amplitude: 1.5 V
Lead Channel Setting Pacing Pulse Width: 0.4 ms
Lead Channel Setting Sensing Sensitivity: 0.3 mV
Zone Setting Status: 755011
Zone Setting Status: 755011

## 2022-09-22 ENCOUNTER — Encounter: Payer: Self-pay | Admitting: Family Medicine

## 2022-09-23 ENCOUNTER — Encounter: Payer: Self-pay | Admitting: Family Medicine

## 2022-09-23 ENCOUNTER — Ambulatory Visit (INDEPENDENT_AMBULATORY_CARE_PROVIDER_SITE_OTHER): Payer: Medicare HMO | Admitting: Family Medicine

## 2022-09-23 VITALS — BP 115/73 | HR 65 | Temp 97.8°F | Ht 67.0 in | Wt 162.0 lb

## 2022-09-23 DIAGNOSIS — W19XXXA Unspecified fall, initial encounter: Secondary | ICD-10-CM | POA: Diagnosis not present

## 2022-09-23 DIAGNOSIS — S60511A Abrasion of right hand, initial encounter: Secondary | ICD-10-CM | POA: Diagnosis not present

## 2022-09-23 NOTE — Patient Instructions (Addendum)
Continue caring for your wound exactly as you have been.  Keep it clean with gentle soap and water or the saline solution that you have been using.  Change the dressing at least once a day.  If it does start to get red or warm, please let me know as this can be a sign of infection.  UPCOMING PREVENTATIVE CARE: -Shingles 2 dose series

## 2022-09-23 NOTE — Progress Notes (Signed)
   Acute Office Visit  Subjective:     Patient ID: Jacob Harrison, male    DOB: 03/20/1941, 82 y.o.   MRN: [id number]  Chief Complaint  Patient presents with   Fall    Injured rt hand    Fall   Patient is in today for fall.  He states that he fell over the weekend and injured his right hand, he wanted to make sure that it is healing okay.  The skin on the dorsum of his right hand originally peeled off in 2 places, but he was able to push it back into place.  Since then, he has been washing the wound with soap and water or saline solution and changing the dressing once daily.  He denies any ongoing pain about the right arm, denies fever/chills, denies dizziness  ROS Negative unless otherwise noted in HPI    Objective:    BP 115/73   Pulse 65   Temp 97.8 F (36.6 C) (Oral)   Ht 5\' 7"  (1.702 m)   Wt 162 lb (73.5 kg)   SpO2 97%   BMI 25.37 kg/m   Physical Exam Constitutional:      General: He is not in acute distress.    Appearance: Normal appearance.  HENT:     Head: Normocephalic and atraumatic.     Right Ear: Tympanic membrane and ear canal normal.     Left Ear: Tympanic membrane and ear canal normal.     Nose: Nose normal.     Mouth/Throat:     Mouth: Mucous membranes are moist.     Pharynx: Oropharynx is clear.  Eyes:     Pupils: Pupils are equal, round, and reactive to light.  Cardiovascular:     Rate and Rhythm: Normal rate and regular rhythm.     Pulses: Normal pulses.     Heart sounds: Normal heart sounds.  Pulmonary:     Effort: Pulmonary effort is normal.     Breath sounds: Normal breath sounds.  Abdominal:     General: Bowel sounds are normal.     Palpations: Abdomen is soft.  Musculoskeletal:        General: Normal range of motion.     Cervical back: Normal range of motion and neck supple.  Skin:    General: Skin is warm and dry.     Findings: Abrasion (Two abrasions on dorsum of right hand. More inferior is approximately 1.5 x 1.5 cm,  edges not bleeding but still not scabbed over. More superior 1 x 1 cm, skin flap completely recovers wound.) present.     Comments: No surrounding erythema or warmth  Neurological:     Mental Status: He is alert and oriented to person, place, and time.  Psychiatric:        Mood and Affect: Mood normal.      Assessment & Plan:  Fall, initial encounter  Abrasion of right hand, initial encounter  Wounds on right hand are healing well.  Advised patient to continue wound care as he has been, emphasized cleaning the wound with soap and water/saline while avoiding hydrogen peroxide or rubbing alcohol.  Continue changing dressing daily.  If redness or warmth develops in the area, alert me immediately.  At that point, we will consider topical mupirocin.  Patient verbalized understanding and is agreeable to this plan.  Return if symptoms worsen or fail to improve.  [name], PA

## 2022-09-26 ENCOUNTER — Encounter: Payer: Self-pay | Admitting: Internal Medicine

## 2022-09-29 MED ORDER — EMPAGLIFLOZIN 10 MG PO TABS: 10.0000 mg | ORAL_TABLET | Freq: Every day | ORAL | 0 refills | Status: DC

## 2022-10-01 ENCOUNTER — Encounter: Payer: Self-pay | Admitting: Internal Medicine

## 2022-10-01 ENCOUNTER — Encounter: Payer: Self-pay | Admitting: Family Medicine

## 2022-10-02 ENCOUNTER — Encounter: Payer: Self-pay | Admitting: Family Medicine

## 2022-10-02 ENCOUNTER — Ambulatory Visit (INDEPENDENT_AMBULATORY_CARE_PROVIDER_SITE_OTHER): Payer: Medicare HMO | Admitting: Family Medicine

## 2022-10-02 VITALS — BP 116/72 | HR 65 | Resp 18 | Ht 67.0 in | Wt 148.0 lb

## 2022-10-02 DIAGNOSIS — R82998 Other abnormal findings in urine: Secondary | ICD-10-CM

## 2022-10-02 DIAGNOSIS — R351 Nocturia: Secondary | ICD-10-CM | POA: Diagnosis not present

## 2022-10-02 DIAGNOSIS — N401 Enlarged prostate with lower urinary tract symptoms: Secondary | ICD-10-CM

## 2022-10-02 DIAGNOSIS — R310 Gross hematuria: Secondary | ICD-10-CM

## 2022-10-02 DIAGNOSIS — N3000 Acute cystitis without hematuria: Secondary | ICD-10-CM | POA: Diagnosis not present

## 2022-10-02 DIAGNOSIS — R300 Dysuria: Secondary | ICD-10-CM

## 2022-10-02 LAB — POCT URINALYSIS DIP (CLINITEK)
Bilirubin, UA: NEGATIVE
Blood, UA: NEGATIVE
Glucose, UA: 500 mg/dL — AB
Ketones, POC UA: NEGATIVE mg/dL
Nitrite, UA: NEGATIVE
POC PROTEIN,UA: NEGATIVE
Spec Grav, UA: 1.01 (ref 1.010–1.025)
Urobilinogen, UA: 0.2 E.U./dL
pH, UA: 6.5 (ref 5.0–8.0)

## 2022-10-02 MED ORDER — SULFAMETHOXAZOLE-TRIMETHOPRIM 800-160 MG PO TABS
1.0000 | ORAL_TABLET | Freq: Two times a day (BID) | ORAL | 0 refills | Status: AC
Start: 2022-10-02 — End: 2022-10-09

## 2022-10-02 NOTE — Progress Notes (Signed)
Acute Office Visit  Subjective:     Patient ID: Jacob Harrison, male    DOB: May 07, 1940, 82 y.o.   MRN: [id number]  Chief Complaint  Patient presents with   Urinary Tract Infection    HPI Patient is in today for urinary issues.  Yesterday morning, his urine was unusually dark with a reddish hue.  Urinary stream is weak and slightly painful.  He has been drinking extra water and ice tea since that time.  Urine returned to light yellow color yesterday afternoon.  He has a history of BPH for which he is taking finasteride, and more recently developed rhabdomyolysis 3 months ago and has since had to discontinue statin medications.  He has not had any more dark urine or painful urine since the initial episode yesterday morning.  Denies fever, chills, abdominal pain, flank pain, nausea/vomiting.  ROS Negative unless otherwise noted in HPI    Objective:    BP 116/72 (BP Location: Left Arm, Patient Position: Sitting, Cuff Size: Normal)   Pulse 65   Resp 18   Ht 5\' 7"  (1.702 m)   Wt 148 lb (67.1 kg)   SpO2 96%   BMI 23.18 kg/m   Physical Exam Constitutional:      General: He is not in acute distress.    Appearance: Normal appearance.  HENT:     Head: Normocephalic and atraumatic.  Cardiovascular:     Rate and Rhythm: Normal rate and regular rhythm.     Heart sounds: Normal heart sounds. No murmur heard.    No friction rub. No gallop.  Pulmonary:     Effort: Pulmonary effort is normal.     Breath sounds: Normal breath sounds. No wheezing, rhonchi or rales.  Abdominal:     General: There is no distension.     Palpations: There is no mass.     Tenderness: There is no abdominal tenderness. There is no guarding.  Skin:    General: Skin is warm and dry.  Neurological:     Mental Status: He is alert and oriented to person, place, and time. Mental status is at baseline.  Psychiatric:        Mood and Affect: Mood normal.    Results for orders placed or performed in visit  on 10/02/22  POCT URINALYSIS DIP (CLINITEK)  Result Value Ref Range   Color, UA yellow yellow   Clarity, UA clear clear   Glucose, UA =500 (A) negative mg/dL   Bilirubin, UA negative negative   Ketones, POC UA negative negative mg/dL   Spec Grav, UA 7.829 5.621 - 1.025   Blood, UA negative negative   pH, UA 6.5 5.0 - 8.0   POC PROTEIN,UA negative negative, trace   Urobilinogen, UA 0.2 0.2 or 1.0 E.U./dL   Nitrite, UA Negative Negative   Leukocytes, UA Trace (A) Negative     Assessment & Plan:  Acute cystitis without hematuria -     Sulfamethoxazole-Trimethoprim; Take 1 tablet by mouth 2 (two) times daily for 7 days.  Dispense: 14 tablet; Refill: 0  Dysuria -     POCT URINALYSIS DIP (CLINITEK) -     Urine Culture -     Sulfamethoxazole-Trimethoprim; Take 1 tablet by mouth 2 (two) times daily for 7 days.  Dispense: 14 tablet; Refill: 0  Leukocytes in urine -     Urine Culture -     Sulfamethoxazole-Trimethoprim; Take 1 tablet by mouth 2 (two) times daily for 7 days.  Dispense: 14  tablet; Refill: 0  Gross hematuria -     Ambulatory referral to Urology  BPH associated with nocturia -     Ambulatory referral to Urology  Acute cystitis, dysuria, leukocytes in urine Urinalysis positive for UTI, starting 7-day course of Bactrim 1 tablet twice daily.  Encourage patient to continue to closely monitor his urinary symptoms and alert me if the episode of dark urine occurs again.  Gross hematuria, BPH Patient has had BPH and been taking finasteride for many years but has never seen urology.  Given the episode yesterday of gross hematuria that has since resolved, recommend follow-up with urology to discuss the need for further evaluation including considering possible cystoscopy.  Patient verbalized understanding and is agreeable to this plan.  Return if symptoms worsen or fail to improve.  [name], PA

## 2022-10-05 LAB — URINE CULTURE

## 2022-10-06 HISTORY — PX: CATARACT EXTRACTION, BILATERAL: SHX1313

## 2022-10-06 NOTE — Therapy (Unsigned)
OUTPATIENT PHYSICAL THERAPY THORACOLUMBAR EVALUATION   Patient Name: Jacob Harrison MRN: [id number] DOB:Nov 17, 1940, 82 y.o., male Today's Date: 10/07/2022  END OF SESSION:  PT End of Session - 10/07/22 1258     Visit Number 1    Number of Visits 8    Date for PT Re-Evaluation 12/02/22    Authorization Type [employer] MCR    Progress Note Due on Visit 10    PT Start Time 1215    PT Stop Time 1300    PT Time Calculation (min) 45 min    Activity Tolerance Patient tolerated treatment well    Behavior During Therapy [facility] for tasks assessed/performed             Past Medical History:  Diagnosis Date   (HFpEF) heart failure with preserved ejection fraction (HCC)    a. 10/2016 Echo: EF 65-70%, mild LVH, mildly dil LA. RV fxn nl.   Arthritis    BPH (benign prostatic hyperplasia)    Coronary artery disease    a. 2001 CABG x 3 [facility]): LIMA->LAD, VG->LCX, VG->RPDA; b. 04/05/2010 Cath ([facility]): LM 30m, LAD 139m, LCX 19m, RCA 100p, LIMA->LAD atretic, VG->LCX 100, VG->RCA 80ost, 70d; c. 04/2010 Redo CABG x 2 ([place]): VG->LAD, VG->RPDA; d. 10/2012 Cath [facility]): LM 20-30, LAD 100/95-50m, LCX 40-47m, RCA 100p, 80-100d, RPDA 99, RPL1 95, VG->LAD ok, VG->RPDA ok, EF 67%; e. 11/2016 MV: EF 48%, No ischemia.   Diabetes mellitus without complication (HCC)    Type II   Hyperlipidemia    Hypertension    Ischemic cardiomyopathy    a. 03/2021 s/p MDT Visia AF MRI VR SureScan single lead AICD (ser# [id number]).   Scoliosis    Spinal stenosis    Past Surgical History:  Procedure Laterality Date   CARDIAC CATHETERIZATION     CORONARY ARTERY BYPASS GRAFT     x 2 (2001 and redo in late 2011 or early 2012)   HERNIA REPAIR Left    inguinal   ICD IMPLANT N/A 03/20/2021   Procedure: ICD IMPLANT;  Surgeon: [name], MD;  Location: [facility] INVASIVE CV LAB;  Service: Cardiovascular;  Laterality: N/A;   LEFT HEART CATH AND CORS/GRAFTS ANGIOGRAPHY N/A 10/04/2020   Procedure: LEFT HEART CATH  AND CORS/GRAFTS ANGIOGRAPHY;  Surgeon: [name], MD;  Location: [facility] INVASIVE CV LAB;  Service: Cardiovascular;  Laterality: N/A;   LUMBAR LAMINECTOMY/DECOMPRESSION MICRODISCECTOMY N/A 10/20/2018   Procedure: L3-4, L4-5 LUMBAR DECOMPRESSION with dural repair.;  Surgeon: [name], MD;  Location: [facility] OR;  Service: Orthopedics;  Laterality: N/A;   RIGHT/LEFT HEART CATH AND CORONARY/GRAFT ANGIOGRAPHY N/A 05/27/2022   Procedure: RIGHT/LEFT HEART CATH AND CORONARY/GRAFT ANGIOGRAPHY;  Surgeon: [name], MD;  Location: [facility] INVASIVE CV LAB;  Service: Cardiovascular;  Laterality: N/A;   TONSILLECTOMY AND ADENOIDECTOMY     TRANSURETHRAL RESECTION OF PROSTATE  1990   Patient Active Problem List   Diagnosis Date Noted   Paroxysmal atrial fibrillation (HCC) 08/27/2022   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) 08/27/2022   ICD (implantable cardioverter-defibrillator) in place 08/05/2022   Rhabdomyolysis 07/05/2022   Transaminitis 07/05/2022   Leukocytosis 07/05/2022   Chronic low back pain 07/02/2022   Dyspnea on exertion 05/22/2022   Chronic HFrEF (heart failure with reduced ejection fraction) (HCC) 12/13/2021   Acute on chronic heart failure with preserved ejection fraction (HFpEF) (HCC) 10/08/2020   Non-ST elevation (NSTEMI) myocardial infarction (HCC) 10/08/2020   CHF (congestive heart failure) (HCC) 10/04/2020   Hx of CABG  Accelerating angina (HCC) 10/03/2020   Rotator cuff disorder 08/09/2019   BPH associated with nocturia 11/29/2018   Status post lumbar spine surgery for decompression of spinal cord 10/29/2018   Spinal stenosis of lumbar region 08/02/2018   Atypical mole 07/30/2017   Wheezing 07/21/2017   Sinus bradycardia 03/18/2017   Ventral hernia without obstruction or gangrene 02/25/2017   (HFpEF) heart failure with preserved ejection fraction (HCC) 09/28/2016   Ischemic cardiomyopathy 09/28/2016   Vitamin D deficiency 06/27/2016   Vitamin  B deficiency 06/27/2016   Type 2 diabetes mellitus without complication, without long-term current use of insulin (HCC) 05/20/2016   Essential hypertension 05/20/2016   Hyperlipidemia associated with type 2 diabetes mellitus (HCC) 05/20/2016   Coronary artery disease of native artery of native heart with stable angina pectoris (HCC) 05/20/2016    PCP: [name], PA   REFERRING PROVIDER: [name], PA   REFERRING DIAG: R53.1 (ICD-10-CM) - Weakness M25.60 (ICD-10-CM) - Decreased range of motion  Rationale for Evaluation and Treatment: Rehabilitation  THERAPY DIAG:  Other abnormalities of gait and mobility  Muscle weakness (generalized)  Physical deconditioning  ONSET DATE: 07/10/22  SUBJECTIVE:                                                                                                                                                                                           SUBJECTIVE STATEMENT: History of rhabdomyolysis leading to deconditioning and Le weakness and deconditioning.   PERTINENT HISTORY:  Patient had statin induced rhabdomyolysis, myalgia has resolved but he continues to experience weakness, tightness, limited ROM specifically in legs and lower back, shoulders and neck.  PAIN:  Are you having pain? No  PRECAUTIONS: Fall  WEIGHT BEARING RESTRICTIONS: No  FALLS:  Has patient fallen in last 6 months? Yes. Number of falls 1  OCCUPATION: retired  PLOF: Independent  PATIENT GOALS: To regain my LE strength and mobility  NEXT MD VISIT: PRN  OBJECTIVE:   DIAGNOSTIC FINDINGS:  none  PATIENT SURVEYS:  FOTO 49(64 predicted)  SCREENING FOR RED FLAGS: Bowel or bladder incontinence: No  MUSCLE LENGTH: N/T  POSTURE: rounded shoulders, forward head, and decreased lumbar lordosis   LUMBAR ROM: deferred  AROM eval  Flexion   Extension   Right lateral flexion   Left lateral flexion   Right rotation   Left rotation    (Blank rows =  not tested)  LOWER EXTREMITY ROM:   WFL for gait, bed mobility and transfers  Active  Right eval Left eval  Hip flexion    Hip extension    Hip abduction    Hip adduction  Hip internal rotation    Hip external rotation    Knee flexion    Knee extension    Ankle dorsiflexion    Ankle plantarflexion    Ankle inversion    Ankle eversion     (Blank rows = not tested)  LOWER EXTREMITY MMT:    MMT Right eval Left eval  Hip flexion 3+ 4-  Hip extension 3+ 4-  Hip abduction 3+ 4-  Hip adduction    Hip internal rotation    Hip external rotation    Knee flexion 3+ 4-  Knee extension 3+ 4-  Ankle dorsiflexion    Ankle plantarflexion 3+ 4-  Ankle inversion    Ankle eversion     (Blank rows = not tested)  LUMBAR SPECIAL TESTS:  Slump test: Negative  FUNCTIONAL TESTS:  5 times sit to stand: 27s with UE Support 2 MWT with cane 250ft  GAIT: Distance walked: 69ftx2 Assistive device utilized: Single point cane Level of assistance: Complete Independence Comments: slow cadence  TODAY'S TREATMENT:                                                                                                                              DATE: 10/07/22 Eval and HEP   PATIENT EDUCATION:  Education details: Discussed eval findings, rehab rationale and POC and patient is in agreement  Person educated: Patient Education method: Explanation Education comprehension: verbalized understanding and needs further education  HOME EXERCISE PROGRAM: Access Code: [email or web address] URL: [email or web address] Date: 10/07/2022 Prepared by: Gustavus Bryant  Exercises - Sit to Stand with Armchair  - 2 x daily - 5 x weekly - 1 sets - 5 reps - Standing Heel Raise with Support  - 2 x daily - 5 x weekly - 2 sets - 10 reps - Supine Bridge  - 2 x daily - 5 x weekly - 2 sets - 10 reps - Supine March  - 2 x daily - 5 x weekly - 2 sets - 10 reps - Curl Up with Reach  - 2 x daily - 5 x weekly - 2 sets - 10  reps  ASSESSMENT:  CLINICAL IMPRESSION: Patient is a 83 y.o. male who was seen today for physical therapy evaluation and treatment for general deconditioning and recent fall.   OBJECTIVE IMPAIRMENTS: Abnormal gait, decreased activity tolerance, decreased balance, decreased coordination, decreased endurance, decreased mobility, difficulty walking, decreased strength, and postural dysfunction.   ACTIVITY LIMITATIONS: carrying, lifting, standing, squatting, stairs, transfers, and bed mobility  PERSONAL FACTORS: Age, Past/current experiences, and Time since onset of injury/illness/exacerbation are also affecting patient's functional outcome.   REHAB POTENTIAL: Good  CLINICAL DECISION MAKING: Evolving/moderate complexity  EVALUATION COMPLEXITY: Low   GOALS: Goals reviewed with patient? No  SHORT TERM GOALS: Target date: 10/21/2022  Patient to demonstrate independence in HEP Baseline: [id number] Goal status: INITIAL  2.  500 ft ambulation with SPC  Baseline: 225ft w/cane Goal status: INITIAL  LONG TERM GOALS:  Target date: 12/08/22  Decrease 5x STS time to 20s arms crossed Baseline: 27s arms used Goal status: INITIAL  2.  Increase distance on 2 MWT to 365ft w/cane Baseline: 256ft w/cane Goal status: INITIAL  3.  Increase BLE strength to 4/5 Baseline:  MMT Right eval Left eval  Hip flexion 3+ 4-  Hip extension 3+ 4-  Hip abduction 3+ 4-  Hip adduction    Hip internal rotation    Hip external rotation    Knee flexion 3+ 4-  Knee extension 3+ 4-  Ankle dorsiflexion    Ankle plantarflexion 3+ 4-   Goal status: INITIAL  4.  Increase FOTO score to 64 Baseline: 49 Goal status: INITIAL   PLAN:  PT FREQUENCY: 2x/week  PT DURATION: 4 weeks  PLANNED INTERVENTIONS: Therapeutic exercises, Therapeutic activity, Neuromuscular re-education, Balance training, Gait training, Patient/Family education, Self Care, Joint mobilization, Aquatic Therapy, Dry Needling, Electrical  stimulation, Cryotherapy, Moist heat, Manual therapy, and Re-evaluation.  PLAN FOR NEXT SESSION: HEP review and update, manual techniques as appropriate, aerobic tasks, ROM and flexibility activities, strengthening and PREs, TPDN, gait and balance training as needed     Hildred Laser, PT 10/07/2022, 12:59 PM

## 2022-10-07 ENCOUNTER — Ambulatory Visit: Payer: Medicare HMO | Attending: Family Medicine

## 2022-10-07 ENCOUNTER — Other Ambulatory Visit: Payer: Self-pay

## 2022-10-07 ENCOUNTER — Other Ambulatory Visit: Payer: Self-pay | Admitting: Medical

## 2022-10-07 DIAGNOSIS — R2689 Other abnormalities of gait and mobility: Secondary | ICD-10-CM | POA: Diagnosis not present

## 2022-10-07 DIAGNOSIS — R531 Weakness: Secondary | ICD-10-CM | POA: Insufficient documentation

## 2022-10-07 DIAGNOSIS — M6281 Muscle weakness (generalized): Secondary | ICD-10-CM | POA: Insufficient documentation

## 2022-10-07 DIAGNOSIS — M2560 Stiffness of unspecified joint, not elsewhere classified: Secondary | ICD-10-CM | POA: Diagnosis not present

## 2022-10-07 DIAGNOSIS — R5381 Other malaise: Secondary | ICD-10-CM | POA: Diagnosis not present

## 2022-10-09 ENCOUNTER — Other Ambulatory Visit: Payer: Self-pay | Admitting: Internal Medicine

## 2022-10-09 DIAGNOSIS — I25118 Atherosclerotic heart disease of native coronary artery with other forms of angina pectoris: Secondary | ICD-10-CM

## 2022-10-13 ENCOUNTER — Telehealth: Payer: Self-pay

## 2022-10-13 ENCOUNTER — Ambulatory Visit: Payer: Medicare HMO

## 2022-10-14 NOTE — Progress Notes (Signed)
Remote ICD transmission.   

## 2022-10-14 NOTE — Therapy (Signed)
OUTPATIENT PHYSICAL THERAPY THORACOLUMBAR    Patient Name: Jacob Harrison MRN: [id number] DOB:Feb 04, 1941, 82 y.o., male Today's Date: 10/17/2022  END OF SESSION:  PT End of Session - 10/17/22 1348     Visit Number 2    Number of Visits 8    Date for PT Re-Evaluation 12/02/22    Authorization Type Aet[employer] MCR    Progress Note Due on Visit 10    PT Start Time 1345    PT Stop Time 1425    PT Time Calculation (min) 40 min    Activity Tolerance Patient tolerated treatment well    Behavior During Therapy WFL for tasks assessed/performed             Past Medical History:  Diagnosis Date   (HFpEF) heart failure with preserved ejection fraction (HCC)    a. 10/2016 Echo: EF 65-70%, mild LVH, mildly dil LA. RV fxn nl.   Arthritis    BPH (benign prostatic hyperplasia)    Coronary artery disease    a. 2001 CABG x 3 [facility]): LIMA->LAD, VG->LCX, VG->RPDA; b. 04/05/2010 Cath ([place]): LM 60m, LAD 165m, LCX 37m, RCA 100p, LIMA->LAD atretic, VG->LCX 100, VG->RCA 80ost, 70d; c. 04/2010 Redo CABG x 2 ([place]): VG->LAD, VG->RPDA; d. 10/2012 Cath [facility]): LM 20-30, LAD 100/95-38m, LCX 40-54m, RCA 100p, 80-100d, RPDA 99, RPL1 95, VG->LAD ok, VG->RPDA ok, EF 67%; e. 11/2016 MV: EF 48%, No ischemia.   Diabetes mellitus without complication (HCC)    Type II   Hyperlipidemia    Hypertension    Ischemic cardiomyopathy    a. 03/2021 s/p MDT Visia AF MRI VR SureScan single lead AICD (ser# [id number]).   Scoliosis    Spinal stenosis    Past Surgical History:  Procedure Laterality Date   CARDIAC CATHETERIZATION     CORONARY ARTERY BYPASS GRAFT     x 2 (2001 and redo in late 2011 or early 2012)   HERNIA REPAIR Left    inguinal   ICD IMPLANT N/A 03/20/2021   Procedure: ICD IMPLANT;  Surgeon: [name], MD;  Location: [facility] INVASIVE CV LAB;  Service: Cardiovascular;  Laterality: N/A;   LEFT HEART CATH AND CORS/GRAFTS ANGIOGRAPHY N/A 10/04/2020   Procedure: LEFT HEART CATH AND  CORS/GRAFTS ANGIOGRAPHY;  Surgeon: [name], MD;  Location: [facility] INVASIVE CV LAB;  Service: Cardiovascular;  Laterality: N/A;   LUMBAR LAMINECTOMY/DECOMPRESSION MICRODISCECTOMY N/A 10/20/2018   Procedure: L3-4, L4-5 LUMBAR DECOMPRESSION with dural repair.;  Surgeon: [name], MD;  Location: [facility] OR;  Service: Orthopedics;  Laterality: N/A;   RIGHT/LEFT HEART CATH AND CORONARY/GRAFT ANGIOGRAPHY N/A 05/27/2022   Procedure: RIGHT/LEFT HEART CATH AND CORONARY/GRAFT ANGIOGRAPHY;  Surgeon: [name], MD;  Location: [facility] INVASIVE CV LAB;  Service: Cardiovascular;  Laterality: N/A;   TONSILLECTOMY AND ADENOIDECTOMY     TRANSURETHRAL RESECTION OF PROSTATE  1990   Patient Active Problem List   Diagnosis Date Noted   Statins contraindicated 10/16/2022   Paroxysmal atrial fibrillation (HCC) 08/27/2022   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) 08/27/2022   ICD (implantable cardioverter-defibrillator) in place 08/05/2022   Rhabdomyolysis 07/05/2022   Transaminitis 07/05/2022   Leukocytosis 07/05/2022   Chronic low back pain 07/02/2022   Dyspnea on exertion 05/22/2022   Chronic HFrEF (heart failure with reduced ejection fraction) (HCC) 12/13/2021   Acute on chronic heart failure with preserved ejection fraction (HFpEF) (HCC) 10/08/2020   Non-ST elevation (NSTEMI) myocardial infarction (HCC) 10/08/2020   CHF (congestive heart failure) (HCC) 10/04/2020  Hx of CABG    Accelerating angina (HCC) 10/03/2020   Rotator cuff disorder 08/09/2019   BPH associated with nocturia 11/29/2018   Status post lumbar spine surgery for decompression of spinal cord 10/29/2018   Spinal stenosis of lumbar region 08/02/2018   Atypical mole 07/30/2017   Wheezing 07/21/2017   Sinus bradycardia 03/18/2017   Ventral hernia without obstruction or gangrene 02/25/2017   (HFpEF) heart failure with preserved ejection fraction (HCC) 09/28/2016   Ischemic cardiomyopathy 09/28/2016   Vitamin  D deficiency 06/27/2016   Vitamin B deficiency 06/27/2016   Type 2 diabetes mellitus without complication, without long-term current use of insulin (HCC) 05/20/2016   Essential hypertension 05/20/2016   Hyperlipidemia associated with type 2 diabetes mellitus (HCC) 05/20/2016   Coronary artery disease of native artery of native heart with stable angina pectoris (HCC) 05/20/2016    PCP: [name], PA   REFERRING PROVIDER: [name], PA   REFERRING DIAG: R53.1 (ICD-10-CM) - Weakness M25.60 (ICD-10-CM) - Decreased range of motion  Rationale for Evaluation and Treatment: Rehabilitation  THERAPY DIAG:  Other abnormalities of gait and mobility  Muscle weakness (generalized)  Physical deconditioning  ONSET DATE: 07/10/22  SUBJECTIVE:                                                                                                                                                                                           SUBJECTIVE STATEMENT: Has been compliant with HEP and is working on getting to 10 reps.  PERTINENT HISTORY:  Patient had statin induced rhabdomyolysis, myalgia has resolved but he continues to experience weakness, tightness, limited ROM specifically in legs and lower back, shoulders and neck.  PAIN:  Are you having pain? No  PRECAUTIONS: Fall  WEIGHT BEARING RESTRICTIONS: No  FALLS:  Has patient fallen in last 6 months? Yes. Number of falls 1  OCCUPATION: retired  PLOF: Independent  PATIENT GOALS: To regain my LE strength and mobility  NEXT MD VISIT: PRN  OBJECTIVE:   DIAGNOSTIC FINDINGS:  none  PATIENT SURVEYS:  FOTO 49(64 predicted)  SCREENING FOR RED FLAGS: Bowel or bladder incontinence: No  MUSCLE LENGTH: N/T  POSTURE: rounded shoulders, forward head, and decreased lumbar lordosis   LUMBAR ROM: deferred  AROM eval  Flexion   Extension   Right lateral flexion   Left lateral flexion   Right rotation   Left rotation     (Blank rows = not tested)  LOWER EXTREMITY ROM:   WFL for gait, bed mobility and transfers  Active  Right eval Left eval  Hip flexion    Hip extension    Hip  abduction    Hip adduction    Hip internal rotation    Hip external rotation    Knee flexion    Knee extension    Ankle dorsiflexion    Ankle plantarflexion    Ankle inversion    Ankle eversion     (Blank rows = not tested)  LOWER EXTREMITY MMT:    MMT Right eval Left eval  Hip flexion 3+ 4-  Hip extension 3+ 4-  Hip abduction 3+ 4-  Hip adduction    Hip internal rotation    Hip external rotation    Knee flexion 3+ 4-  Knee extension 3+ 4-  Ankle dorsiflexion    Ankle plantarflexion 3+ 4-  Ankle inversion    Ankle eversion     (Blank rows = not tested)  LUMBAR SPECIAL TESTS:  Slump test: Negative  FUNCTIONAL TESTS:  5 times sit to stand: 27s with UE Support 2 MWT with cane 298ft  GAIT: Distance walked: 47ftx2 Assistive device utilized: Single point cane Level of assistance: Complete Independence Comments: slow cadence  TODAY'S TREATMENT:       [facility] Adult PT Treatment:                                                DATE: 10/17/22 Therapeutic Exercise: Supine QL stretch 30s x2 B Open book 10/10 Supine march 10/10 PPT 3s hold 10x  Therapeutic Activity:   10/17/22 0001  Berg Balance Test  Sit to Stand 4  Standing Unsupported 4  Sitting with Back Unsupported but Feet Supported on Floor or Stool 4  Stand to Sit 4  Transfers 4  Standing Unsupported with Eyes Closed 4  Standing Unsupported with Feet Together 4  From Standing, Reach Forward with Outstretched Arm 4  From Standing Position, Pick up Object from Floor 3  From Standing Position, Turn to Look Behind Over each Shoulder 4  Turn 360 Degrees 2  Standing Unsupported, Alternately Place Feet on Step/Stool 4  Standing Unsupported, One Foot in Front 3  Standing on One Leg 2  Total Score 50                                                                                                                          DATE: 10/07/22 Eval and HEP   PATIENT EDUCATION:  Education details: Discussed eval findings, rehab rationale and POC and patient is in agreement  Person educated: Patient Education method: Explanation Education comprehension: verbalized understanding and needs further education  HOME EXERCISE PROGRAM: Access Code: [email or web address] URL: [email or web address] Date: 10/17/2022 Prepared by: Gustavus Bryant  Exercises - Sit to Stand with Armchair  - 2 x daily - 5 x weekly - 1 sets - 5 reps - Standing Heel Raise with Support  - 2 x daily - 5 x weekly - 2 sets - 10 reps -  Supine Bridge  - 2 x daily - 5 x weekly - 2 sets - 10 reps - Curl Up with Reach  - 2 x daily - 5 x weekly - 2 sets - 10 reps - Supine March with Posterior Pelvic Tilt  - 2 x daily - 5 x weekly - 2 sets - 10 reps - 30s hold - Sidelying Open Book Thoracic Lumbar Rotation and Extension  - 2 x daily - 5 x weekly - 1 sets - 10 reps  ASSESSMENT:  CLINICAL IMPRESSION: Patient returns for first f/u session and reports compliance with HEP.  Assessed BERG with no deficits in static balance identified.  Incorporated stretching tasks for lumbosacral and trunk regions.  HEP updated and reviewed. Focus on core strength and stabilization.   OBJECTIVE IMPAIRMENTS: Abnormal gait, decreased activity tolerance, decreased balance, decreased coordination, decreased endurance, decreased mobility, difficulty walking, decreased strength, and postural dysfunction.   ACTIVITY LIMITATIONS: carrying, lifting, standing, squatting, stairs, transfers, and bed mobility  PERSONAL FACTORS: Age, Past/current experiences, and Time since onset of injury/illness/exacerbation are also affecting patient's functional outcome.   REHAB POTENTIAL: Good  CLINICAL DECISION MAKING: Evolving/moderate complexity  EVALUATION COMPLEXITY: Low   GOALS: Goals reviewed with patient? No  SHORT  TERM GOALS: Target date: 10/21/2022  Patient to demonstrate independence in HEP Baseline: [id number] Goal status: INITIAL  2.  500 ft ambulation with SPC  Baseline: 245ft w/cane Goal status: INITIAL  LONG TERM GOALS: Target date: 12/08/22  Decrease 5x STS time to 20s arms crossed Baseline: 27s arms used Goal status: INITIAL  2.  Increase distance on 2 MWT to 352ft w/cane Baseline: 26ft w/cane Goal status: INITIAL  3.  Increase BLE strength to 4/5 Baseline:  MMT Right eval Left eval  Hip flexion 3+ 4-  Hip extension 3+ 4-  Hip abduction 3+ 4-  Hip adduction    Hip internal rotation    Hip external rotation    Knee flexion 3+ 4-  Knee extension 3+ 4-  Ankle dorsiflexion    Ankle plantarflexion 3+ 4-   Goal status: INITIAL  4.  Increase FOTO score to 64 Baseline: 49 Goal status: INITIAL   PLAN:  PT FREQUENCY: 2x/week  PT DURATION: 4 weeks  PLANNED INTERVENTIONS: Therapeutic exercises, Therapeutic activity, Neuromuscular re-education, Balance training, Gait training, Patient/Family education, Self Care, Joint mobilization, Aquatic Therapy, Dry Needling, Electrical stimulation, Cryotherapy, Moist heat, Manual therapy, and Re-evaluation.  PLAN FOR NEXT SESSION: HEP review and update, manual techniques as appropriate, aerobic tasks, ROM and flexibility activities, strengthening and PREs, TPDN, gait and balance training as needed     Hildred Laser, PT 10/17/2022, 2:26 PM

## 2022-10-16 ENCOUNTER — Encounter: Payer: Self-pay | Admitting: Pharmacist

## 2022-10-16 ENCOUNTER — Ambulatory Visit: Payer: Medicare HMO | Admitting: Pharmacist

## 2022-10-16 ENCOUNTER — Telehealth: Payer: Self-pay | Admitting: Pharmacist

## 2022-10-16 DIAGNOSIS — E1169 Type 2 diabetes mellitus with other specified complication: Secondary | ICD-10-CM

## 2022-10-16 DIAGNOSIS — I214 Non-ST elevation (NSTEMI) myocardial infarction: Secondary | ICD-10-CM | POA: Diagnosis not present

## 2022-10-16 DIAGNOSIS — I25118 Atherosclerotic heart disease of native coronary artery with other forms of angina pectoris: Secondary | ICD-10-CM | POA: Diagnosis not present

## 2022-10-16 DIAGNOSIS — E785 Hyperlipidemia, unspecified: Secondary | ICD-10-CM

## 2022-10-16 DIAGNOSIS — Z5309 Procedure and treatment not carried out because of other contraindication: Secondary | ICD-10-CM | POA: Diagnosis not present

## 2022-10-16 MED ORDER — EMPAGLIFLOZIN 10 MG PO TABS: 10.0000 mg | ORAL_TABLET | Freq: Every day | ORAL | 0 refills | Status: DC

## 2022-10-16 MED ORDER — REPATHA SURECLICK 140 MG/ML ~~LOC~~ SOAJ
140.0000 mg | SUBCUTANEOUS | 0 refills | Status: DC
Start: 2022-10-16 — End: 2022-10-29

## 2022-10-16 NOTE — Progress Notes (Signed)
Patient ID: Jacob Harrison                 DOB: 09-23-1940                    MRN: [id number]     HPI: Jacob Harrison is a 82 y.o. male patient referred to lipid clinic by Jacob Harrison. PMH is significant for HTN, CAD, HFpEF, NSTEMI, A Fib, T2DM, CABG, and rhabdomyolysis on statins.  Patient presents today to discuss lipid lowering options. CK increased to >4000 on rosuvastatin and has now resolved. Zetia was also discontinued . Reports he feels much better.  Concerned because he is now in Medicare coverage gap. Has applied for patient assistance for Eliquis, entresto, and Jardiance.    Current Medications: N/A  Intolerances:  Statins (Contraindicated)  Risk Factors:  NSTEMI T2DM CHF CAD  LDL goal: <55  Labs: TC 241, Trigs 102, HDL 81, LDL 140, Direct LDL 141 (09/15/22)  Past Medical History:  Diagnosis Date   (HFpEF) heart failure with preserved ejection fraction (HCC)    a. 10/2016 Echo: EF 65-70%, mild LVH, mildly dil LA. RV fxn nl.   Arthritis    BPH (benign prostatic hyperplasia)    Coronary artery disease    a. 2001 CABG x 3 [facility]): LIMA->LAD, VG->LCX, VG->RPDA; b. 04/05/2010 Cath ([facility]): LM 80m, LAD 172m, LCX 12m, RCA 100p, LIMA->LAD atretic, VG->LCX 100, VG->RCA 80ost, 70d; c. 04/2010 Redo CABG x 2 ([facility]): VG->LAD, VG->RPDA; d. 10/2012 Cath [facility]): LM 20-30, LAD 100/95-22m, LCX 40-40m, RCA 100p, 80-100d, RPDA 99, RPL1 95, VG->LAD ok, VG->RPDA ok, EF 67%; e. 11/2016 MV: EF 48%, No ischemia.   Diabetes mellitus without complication (HCC)    Type II   Hyperlipidemia    Hypertension    Ischemic cardiomyopathy    a. 03/2021 s/p MDT Visia AF MRI VR SureScan single lead AICD (ser# [id number]).   Scoliosis    Spinal stenosis     Current Outpatient Medications on File Prior to Visit  Medication Sig Dispense Refill   acetaminophen (TYLENOL) 500 MG tablet Take 1,000 mg by mouth as needed for moderate pain or headache.     apixaban (ELIQUIS) 5 MG TABS  tablet Take 1 tablet (5 mg total) by mouth 2 (two) times daily. 180 tablet 1   aspirin EC 81 MG tablet Take 1 tablet (81 mg total) by mouth daily.     carvedilol (COREG) 12.5 MG tablet Take 0.5 tablets (6.25 mg total) by mouth 2 (two) times daily with a meal. 180 tablet 3   Cholecalciferol (VITAMIN D-3 PO) Take 800 Units by mouth daily.     cyanocobalamin 500 MCG tablet Take 500 mcg by mouth daily.     empagliflozin (JARDIANCE) 10 MG TABS tablet Take 1 tablet (10 mg total) by mouth daily before breakfast. 90 tablet 0   finasteride (PROSCAR) 5 MG tablet Take 1 tablet (5 mg total) by mouth daily. 90 tablet 1   furosemide (LASIX) 40 MG tablet Take 2 tablets (80 mg total) by mouth every morning AND 1 tablet (40 mg total) every evening. (Patient taking differently: No sig reported) 270 tablet 3   Glucosamine-Chondroit-Vit C-Mn (GLUCOSAMINE 1500 COMPLEX) CAPS Take 1 capsule by mouth daily.     isosorbide mononitrate (IMDUR) 30 MG 24 hr tablet Take 1 tablet (30 mg total) by mouth 2 (two) times daily. 180 tablet 3   Lancets (ONETOUCH ULTRASOFT) lancets Use to check blood sugars fasting daily 100 each 12  losartan (COZAAR) 25 MG tablet Take 12.5 mg by mouth daily.     Multiple Vitamins-Minerals (PRESERVISION AREDS 2 PO) Take 1 capsule by mouth 2 (two) times a day.     nitroGLYCERIN (NITROSTAT) 0.4 MG SL tablet PLACE 1 TABLET UNDER THE TONGUE EVERY 5 MINUTES AS NEEDED. 25 tablet 0   ranolazine (RANEXA) 1000 MG SR tablet TAKE 1 TABLET BY MOUTH TWICE A DAY 180 tablet 0   triamcinolone cream (KENALOG) 0.1 % Apply 1 Application topically as directed. Qd up to 5 days per week to itchy rash on body until clear, avoid face, groin, axilla 80 g 1   No current facility-administered medications on file prior to visit.    Allergies  Allergen Reactions   Statins Other (See Comments)    Rhabdo in combination with Terbinafine    Other Itching and Rash    Steroid injection in 2021 caused rash/itching    Tetracyclines & Related Rash    Assessment/Plan:  1. Hyperlipidemia - Patient LDL 141 which is above goal of <55. Can not take statins and will continue to avoid ezetimibe. Recommend starting PCSK9i.  Using demo pen, educated patient on mechanism of action, storage, site selection, administration, and possible adverse effects. Will complete PA. Patient concerned regarding cost. Had patient sign his sections of patient assistance application and will fax to manufacturer. Gave 2 sample boxes of Repatha to get him started while application processes. Recheck cholesterol panel in 2-3 months.  Start Repatha/Praluent q 2 weeks Recheck lipid panel in 2-3 months  Jacob Harrison, PharmD, BCACP, CDCES, CPP [address] [place], [place], [address] Phone: [phone], Fax: [phone]

## 2022-10-16 NOTE — Telephone Encounter (Signed)
PA submitted.  [id number]

## 2022-10-16 NOTE — Telephone Encounter (Signed)
Completed patient assistance application packed at emailed to [facility] office for Dr End signature

## 2022-10-16 NOTE — Telephone Encounter (Signed)
PA approved through 04/07/23 

## 2022-10-16 NOTE — Telephone Encounter (Signed)
Please complete PA for Repatha or Praluent.  Rhabdomyolysis on statins

## 2022-10-16 NOTE — Patient Instructions (Signed)
It was nice meeting you today  We would like your LDL (bad cholesterol) to be less than 55  We will start a new medication called Repatha or Praluent. Both of which are injected once every 2 weeks  I will complete the prior authorization and submit your patient assistance application  Once you start the medication we will recheck your cholesterol levels in 2-3 months  Please message or call with any questions  Laural Golden, PharmD, BCACP, CDCES, CPP [address], [address] [place], [place], [address] Phone: [phone], Fax: [phone]

## 2022-10-17 ENCOUNTER — Ambulatory Visit: Payer: Medicare HMO

## 2022-10-17 DIAGNOSIS — M6281 Muscle weakness (generalized): Secondary | ICD-10-CM | POA: Diagnosis not present

## 2022-10-17 DIAGNOSIS — R5381 Other malaise: Secondary | ICD-10-CM | POA: Diagnosis not present

## 2022-10-17 DIAGNOSIS — M2560 Stiffness of unspecified joint, not elsewhere classified: Secondary | ICD-10-CM | POA: Diagnosis not present

## 2022-10-17 DIAGNOSIS — R2689 Other abnormalities of gait and mobility: Secondary | ICD-10-CM | POA: Diagnosis not present

## 2022-10-17 DIAGNOSIS — R531 Weakness: Secondary | ICD-10-CM | POA: Diagnosis not present

## 2022-10-22 ENCOUNTER — Ambulatory Visit: Payer: Medicare HMO

## 2022-10-22 DIAGNOSIS — H25811 Combined forms of age-related cataract, right eye: Secondary | ICD-10-CM | POA: Diagnosis not present

## 2022-10-22 DIAGNOSIS — H2511 Age-related nuclear cataract, right eye: Secondary | ICD-10-CM | POA: Diagnosis not present

## 2022-10-24 ENCOUNTER — Ambulatory Visit
Admission: EM | Admit: 2022-10-24 | Discharge: 2022-10-24 | Disposition: A | Discharge status: Home / Self Care | Payer: Medicare HMO | Attending: Family Medicine | Admitting: Family Medicine

## 2022-10-24 ENCOUNTER — Ambulatory Visit: Payer: Medicare HMO

## 2022-10-24 DIAGNOSIS — Z7901 Long term (current) use of anticoagulants: Secondary | ICD-10-CM

## 2022-10-24 DIAGNOSIS — Z23 Encounter for immunization: Secondary | ICD-10-CM | POA: Diagnosis not present

## 2022-10-24 DIAGNOSIS — S50312A Abrasion of left elbow, initial encounter: Secondary | ICD-10-CM

## 2022-10-24 MED ORDER — TETANUS-DIPHTH-ACELL PERTUSSIS 5-2.5-18.5 LF-MCG/0.5 IM SUSY
0.5000 mL | PREFILLED_SYRINGE | Freq: Once | INTRAMUSCULAR | Status: AC
  Administered 2022-10-24: 0.5 mL via INTRAMUSCULAR

## 2022-10-24 NOTE — Discharge Instructions (Signed)
You have been given a Tdap vaccination to boost your tetanus immunity   You can take off the dressing I applied today late this evening before bed.  Ice and resting your arm will help the bleeding stop.  1-2 times daily, wash your wound with soapy water and put new triple antibiotic ointment on and a new dressing.

## 2022-10-24 NOTE — ED Triage Notes (Signed)
Patient with c/o fall while out in garden picking vegetables this morning. Patient with skin tear to left elbow. Bandage removed and new dressing applied during triage. Bleeding controlled.

## 2022-10-24 NOTE — ED Provider Notes (Signed)
[facility]    CSN: [id number] Arrival date & time: 10/24/22  1145      History   Chief Complaint Chief Complaint  Patient presents with   Fall   Arm Injury    Skin tear    HPI Jacob Harrison is a 82 y.o. male.    Fall  Arm Injury Here for abrasion to left elbow. Today he was in his raised bed garden when he fell and scraped his left elbow on the edge of the raised bed.   He is on eliquis. Wound still oozing.  No head injury or other trauma.  Last Td 2016 on review of the immunization history.  Past Medical History:  Diagnosis Date   (HFpEF) heart failure with preserved ejection fraction (HCC)    a. 10/2016 Echo: EF 65-70%, mild LVH, mildly dil LA. RV fxn nl.   Arthritis    BPH (benign prostatic hyperplasia)    Coronary artery disease    a. 2001 CABG x 3 [facility]): LIMA->LAD, VG->LCX, VG->RPDA; b. 04/05/2010 Cath ([facility]): LM 59m, LAD 133m, LCX 13m, RCA 100p, LIMA->LAD atretic, VG->LCX 100, VG->RCA 80ost, 70d; c. 04/2010 Redo CABG x 2 ([facility]): VG->LAD, VG->RPDA; d. 10/2012 Cath [facility]): LM 20-30, LAD 100/95-74m, LCX 40-24m, RCA 100p, 80-100d, RPDA 99, RPL1 95, VG->LAD ok, VG->RPDA ok, EF 67%; e. 11/2016 MV: EF 48%, No ischemia.   Diabetes mellitus without complication (HCC)    Type II   Hyperlipidemia    Hypertension    Ischemic cardiomyopathy    a. 03/2021 s/p MDT Visia AF MRI VR SureScan single lead AICD (ser# [id number]).   Scoliosis    Spinal stenosis     Patient Active Problem List   Diagnosis Date Noted   Statins contraindicated 10/16/2022   Paroxysmal atrial fibrillation (HCC) 08/27/2022   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) 08/27/2022   ICD (implantable cardioverter-defibrillator) in place 08/05/2022   Rhabdomyolysis 07/05/2022   Transaminitis 07/05/2022   Leukocytosis 07/05/2022   Chronic low back pain 07/02/2022   Dyspnea on exertion 05/22/2022   Chronic HFrEF (heart failure with reduced  ejection fraction) (HCC) 12/13/2021   Acute on chronic heart failure with preserved ejection fraction (HFpEF) (HCC) 10/08/2020   Non-ST elevation (NSTEMI) myocardial infarction (HCC) 10/08/2020   CHF (congestive heart failure) (HCC) 10/04/2020   Hx of CABG    Accelerating angina (HCC) 10/03/2020   Rotator cuff disorder 08/09/2019   BPH associated with nocturia 11/29/2018   Status post lumbar spine surgery for decompression of spinal cord 10/29/2018   Spinal stenosis of lumbar region 08/02/2018   Atypical mole 07/30/2017   Wheezing 07/21/2017   Sinus bradycardia 03/18/2017   Ventral hernia without obstruction or gangrene 02/25/2017   (HFpEF) heart failure with preserved ejection fraction (HCC) 09/28/2016   Ischemic cardiomyopathy 09/28/2016   Vitamin D deficiency 06/27/2016   Vitamin B deficiency 06/27/2016   Type 2 diabetes mellitus without complication, without long-term current use of insulin (HCC) 05/20/2016   Essential hypertension 05/20/2016   Hyperlipidemia associated with type 2 diabetes mellitus (HCC) 05/20/2016   Coronary artery disease of native artery of native heart with stable angina pectoris (HCC) 05/20/2016    Past Surgical History:  Procedure Laterality Date   CARDIAC CATHETERIZATION     CORONARY ARTERY BYPASS GRAFT     x 2 (2001 and redo in late 2011 or early 2012)   HERNIA REPAIR Left    inguinal   ICD IMPLANT N/A 03/20/2021   Procedure: ICD  IMPLANT;  Surgeon: [name], MD;  Location: [facility] INVASIVE CV LAB;  Service: Cardiovascular;  Laterality: N/A;   LEFT HEART CATH AND CORS/GRAFTS ANGIOGRAPHY N/A 10/04/2020   Procedure: LEFT HEART CATH AND CORS/GRAFTS ANGIOGRAPHY;  Surgeon: [name], MD;  Location: [facility] INVASIVE CV LAB;  Service: Cardiovascular;  Laterality: N/A;   LUMBAR LAMINECTOMY/DECOMPRESSION MICRODISCECTOMY N/A 10/20/2018   Procedure: L3-4, L4-5 LUMBAR DECOMPRESSION with dural repair.;  Surgeon: [name], MD;  Location: [facility] OR;  Service:  Orthopedics;  Laterality: N/A;   RIGHT/LEFT HEART CATH AND CORONARY/GRAFT ANGIOGRAPHY N/A 05/27/2022   Procedure: RIGHT/LEFT HEART CATH AND CORONARY/GRAFT ANGIOGRAPHY;  Surgeon: [name], MD;  Location: [facility] INVASIVE CV LAB;  Service: Cardiovascular;  Laterality: N/A;   TONSILLECTOMY AND ADENOIDECTOMY     TRANSURETHRAL RESECTION OF PROSTATE  1990       Home Medications    Prior to Admission medications   Medication Sig Start Date End Date Taking? Authorizing Provider  acetaminophen (TYLENOL) 500 MG tablet Take 1,000 mg by mouth as needed for moderate pain or headache.    [provider]  apixaban (ELIQUIS) 5 MG TABS tablet Take 1 tablet (5 mg total) by mouth 2 (two) times daily. 09/05/22   End, Cristal Deer, MD  aspirin EC 81 MG tablet Take 1 tablet (81 mg total) by mouth daily. 09/26/16   End, Cristal Deer, MD  carvedilol (COREG) 12.5 MG tablet Take 0.5 tablets (6.25 mg total) by mouth 2 (two) times daily with a meal. 05/27/22   End, Cristal Deer, MD  Cholecalciferol (VITAMIN D-3 PO) Take 800 Units by mouth daily.    [provider]  cyanocobalamin 500 MCG tablet Take 500 mcg by mouth daily.    [provider]  empagliflozin (JARDIANCE) 10 MG TABS tablet Take 1 tablet (10 mg total) by mouth daily before breakfast. 09/29/22   Furth, Cadence H, PA-C  empagliflozin (JARDIANCE) 10 MG TABS tablet Take 1 tablet (10 mg total) by mouth daily. 10/16/22   End, Cristal Deer, MD  Evolocumab (REPATHA SURECLICK) 140 MG/ML SOAJ Inject 140 mg into the skin every 14 (fourteen) days. 10/16/22   End, Cristal Deer, MD  finasteride (PROSCAR) 5 MG tablet Take 1 tablet (5 mg total) by mouth daily. 09/15/22   [name], PA  furosemide (LASIX) 40 MG tablet Take 2 tablets (80 mg total) by mouth every morning AND 1 tablet (40 mg total) every evening. Patient taking differently: No sig reported 07/31/22   End, Cristal Deer, MD  Glucosamine-Chondroit-Vit C-Mn (GLUCOSAMINE 1500 COMPLEX) CAPS  Take 1 capsule by mouth daily.    [provider]  isosorbide mononitrate (IMDUR) 30 MG 24 hr tablet Take 1 tablet (30 mg total) by mouth 2 (two) times daily. 04/11/22   End, Cristal Deer, MD  Lancets [facility] ULTRASOFT) lancets Use to check blood sugars fasting daily 02/03/18   Danford, Orpha Bur D, NP  losartan (COZAAR) 25 MG tablet Take 12.5 mg by mouth daily.    [provider]  Multiple Vitamins-Minerals (PRESERVISION AREDS 2 PO) Take 1 capsule by mouth 2 (two) times a day.    [provider]  nitroGLYCERIN (NITROSTAT) 0.4 MG SL tablet PLACE 1 TABLET UNDER THE TONGUE EVERY 5 MINUTES AS NEEDED. 08/07/22   End, Cristal Deer, MD  ranolazine (RANEXA) 1000 MG SR tablet TAKE 1 TABLET BY MOUTH TWICE A DAY 10/10/22   End, Cristal Deer, MD  triamcinolone cream (KENALOG) 0.1 % Apply 1 Application topically as directed. Qd up to 5 days per week to itchy rash  on body until clear, avoid face, groin, axilla 06/19/22   Deirdre Evener, MD    Family History Family History  Problem Relation Age of Onset   Diabetes Mother    Hyperlipidemia Mother    Cancer Father        colon   Skin cancer Brother    Alcohol abuse Maternal Uncle    Diabetes Maternal Uncle    Hyperlipidemia Maternal Uncle    Cancer Paternal Uncle        colon    Social History Social History   Tobacco Use   Smoking status: Never    Passive exposure: Never   Smokeless tobacco: Never  Vaping Use   Vaping status: Never Used  Substance Use Topics   Alcohol use: Not Currently    Comment: 1-2 drinks on occasion previously 5-7 drinks a week (was 10-15 drinks/week)   Drug use: No     Allergies   Statins, Other, and Tetracyclines & related   Review of Systems Review of Systems   Physical Exam Triage Vital Signs ED Triage Vitals  Encounter Vitals Group     BP 10/24/22 1316 98/64     Systolic BP Percentile --      Diastolic BP Percentile --      Pulse Rate 10/24/22 1316 69     Resp 10/24/22 1316 18      Temp 10/24/22 1316 98.2 F (36.8 C)     Temp Source 10/24/22 1316 Oral     SpO2 10/24/22 1316 97 %     Weight --      Height --      Head Circumference --      Peak Flow --      Pain Score 10/24/22 1317 5     Pain Loc --      Pain Education --      Exclude from Growth Chart --    No data found.  Updated Vital Signs BP 98/64 (BP Location: Right Arm)   Pulse 69   Temp 98.2 F (36.8 C) (Oral)   Resp 18   SpO2 97%   Visual Acuity Right Eye Distance:   Left Eye Distance:   Bilateral Distance:    Right Eye Near:   Left Eye Near:    Bilateral Near:     Physical Exam Vitals reviewed.  Constitutional:      General: He is not in acute distress.    Appearance: He is not ill-appearing, toxic-appearing or diaphoretic.  Lymphadenopathy:     Cervical: No cervical adenopathy.  Skin:    Coloration: Skin is not pale.     Comments: There is a full thickness abrasion of the lateral left elbow, about 2 x 1 cm. It is oozing a little blood at the time of exam. There is a thin nonviable piece of skin hanging from the distal end of the abrasion. No ecchymosis or swelling  Neurological:     Mental Status: He is alert.      UC Treatments / Results  Labs (all labs ordered are listed, but only abnormal results are displayed) Labs Reviewed - No data to display  EKG   Radiology No results found.  Procedures Procedures (including critical care time)  Medications Ordered in UC Medications  Tdap (BOOSTRIX) injection 0.5 mL (has no administration in time range)    Initial Impression / Assessment and Plan / UC Course  I have reviewed the triage vital signs and the nursing notes.  Pertinent labs &  imaging results that were available during my care of the patient were reviewed by me and considered in my medical decision making (see chart for details).     The skin flap is trimmed off with suture removal scissors and forceps. Pressure bandage applied with bacitracin and non stick  gauze. Wound care explained.   TdaP given today. Final Clinical Impressions(s) / UC Diagnoses   Final diagnoses:  Abrasion of left elbow, initial encounter  Anticoagulated     Discharge Instructions      You have been given a Tdap vaccination to boost your tetanus immunity   You can take off the dressing I applied today late this evening before bed.  Ice and resting your arm will help the bleeding stop.  1-2 times daily, wash your wound with soapy water and put new triple antibiotic ointment on and a new dressing.     ED Prescriptions   None    PDMP not reviewed this encounter.   Zenia Resides, MD 10/24/22 [phone]

## 2022-10-27 NOTE — Telephone Encounter (Signed)
Spoke with [employer] who said patient;s name and DOB were missing from provider's section of application. Representative would not take information over the phone. Is faxing over form to [place]

## 2022-10-27 NOTE — Telephone Encounter (Signed)
Jacob Harrison with Jacob Harrison called in stating they need additional information from provider on application. Please advise.

## 2022-10-27 NOTE — Telephone Encounter (Signed)
Completed patient assistance package sent to [employer] for Repatha

## 2022-10-28 ENCOUNTER — Ambulatory Visit: Payer: Medicare HMO

## 2022-10-28 DIAGNOSIS — M2560 Stiffness of unspecified joint, not elsewhere classified: Secondary | ICD-10-CM | POA: Diagnosis not present

## 2022-10-28 DIAGNOSIS — R2689 Other abnormalities of gait and mobility: Secondary | ICD-10-CM | POA: Diagnosis not present

## 2022-10-28 DIAGNOSIS — R5381 Other malaise: Secondary | ICD-10-CM

## 2022-10-28 DIAGNOSIS — R531 Weakness: Secondary | ICD-10-CM | POA: Diagnosis not present

## 2022-10-28 DIAGNOSIS — M6281 Muscle weakness (generalized): Secondary | ICD-10-CM | POA: Diagnosis not present

## 2022-10-28 NOTE — Therapy (Signed)
OUTPATIENT PHYSICAL THERAPY THORACOLUMBAR    Patient Name: Jacob Harrison MRN: [id number] DOB:Feb 04, 1941, 82 y.o., male Today's Date: 10/28/2022  END OF SESSION:  PT End of Session - 10/28/22 1116     Visit Number 3    Number of Visits 8    Date for PT Re-Evaluation 12/02/22    Authorization Type [employer] MCR    Progress Note Due on Visit 10    PT Start Time 1125    PT Stop Time 1205    PT Time Calculation (min) 40 min    Activity Tolerance Patient tolerated treatment well    Behavior During Therapy WFL for tasks assessed/performed              Past Medical History:  Diagnosis Date   (HFpEF) heart failure with preserved ejection fraction (HCC)    a. 10/2016 Echo: EF 65-70%, mild LVH, mildly dil LA. RV fxn nl.   Arthritis    BPH (benign prostatic hyperplasia)    Coronary artery disease    a. 2001 CABG x 3 [facility]): LIMA->LAD, VG->LCX, VG->RPDA; b. 04/05/2010 Cath ([facility]): LM 38m, LAD 140m, LCX 21m, RCA 100p, LIMA->LAD atretic, VG->LCX 100, VG->RCA 80ost, 70d; c. 04/2010 Redo CABG x 2 ([place]): VG->LAD, VG->RPDA; d. 10/2012 Cath [facility]): LM 20-30, LAD 100/95-15m, LCX 40-69m, RCA 100p, 80-100d, RPDA 99, RPL1 95, VG->LAD ok, VG->RPDA ok, EF 67%; e. 11/2016 MV: EF 48%, No ischemia.   Diabetes mellitus without complication (HCC)    Type II   Hyperlipidemia    Hypertension    Ischemic cardiomyopathy    a. 03/2021 s/p MDT Visia AF MRI VR SureScan single lead AICD (ser# [id number]).   Scoliosis    Spinal stenosis    Past Surgical History:  Procedure Laterality Date   CARDIAC CATHETERIZATION     CORONARY ARTERY BYPASS GRAFT     x 2 (2001 and redo in late 2011 or early 2012)   HERNIA REPAIR Left    inguinal   ICD IMPLANT N/A 03/20/2021   Procedure: ICD IMPLANT;  Surgeon: [name], MD;  Location: [facility] INVASIVE CV LAB;  Service: Cardiovascular;  Laterality: N/A;   LEFT HEART CATH AND CORS/GRAFTS ANGIOGRAPHY N/A 10/04/2020   Procedure: LEFT HEART CATH AND  CORS/GRAFTS ANGIOGRAPHY;  Surgeon: [name], MD;  Location: [facility] INVASIVE CV LAB;  Service: Cardiovascular;  Laterality: N/A;   LUMBAR LAMINECTOMY/DECOMPRESSION MICRODISCECTOMY N/A 10/20/2018   Procedure: L3-4, L4-5 LUMBAR DECOMPRESSION with dural repair.;  Surgeon: [name], MD;  Location: [facility] OR;  Service: Orthopedics;  Laterality: N/A;   RIGHT/LEFT HEART CATH AND CORONARY/GRAFT ANGIOGRAPHY N/A 05/27/2022   Procedure: RIGHT/LEFT HEART CATH AND CORONARY/GRAFT ANGIOGRAPHY;  Surgeon: [name], MD;  Location: [facility] INVASIVE CV LAB;  Service: Cardiovascular;  Laterality: N/A;   TONSILLECTOMY AND ADENOIDECTOMY     TRANSURETHRAL RESECTION OF PROSTATE  1990   Patient Active Problem List   Diagnosis Date Noted   Statins contraindicated 10/16/2022   Paroxysmal atrial fibrillation (HCC) 08/27/2022   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) 08/27/2022   ICD (implantable cardioverter-defibrillator) in place 08/05/2022   Rhabdomyolysis 07/05/2022   Transaminitis 07/05/2022   Leukocytosis 07/05/2022   Chronic low back pain 07/02/2022   Dyspnea on exertion 05/22/2022   Chronic HFrEF (heart failure with reduced ejection fraction) (HCC) 12/13/2021   Acute on chronic heart failure with preserved ejection fraction (HFpEF) (HCC) 10/08/2020   Non-ST elevation (NSTEMI) myocardial infarction (HCC) 10/08/2020   CHF (congestive heart failure) (HCC) 10/04/2020  Hx of CABG    Accelerating angina (HCC) 10/03/2020   Rotator cuff disorder 08/09/2019   BPH associated with nocturia 11/29/2018   Status post lumbar spine surgery for decompression of spinal cord 10/29/2018   Spinal stenosis of lumbar region 08/02/2018   Atypical mole 07/30/2017   Wheezing 07/21/2017   Sinus bradycardia 03/18/2017   Ventral hernia without obstruction or gangrene 02/25/2017   (HFpEF) heart failure with preserved ejection fraction (HCC) 09/28/2016   Ischemic cardiomyopathy 09/28/2016   Vitamin  D deficiency 06/27/2016   Vitamin B deficiency 06/27/2016   Type 2 diabetes mellitus without complication, without long-term current use of insulin (HCC) 05/20/2016   Essential hypertension 05/20/2016   Hyperlipidemia associated with type 2 diabetes mellitus (HCC) 05/20/2016   Coronary artery disease of native artery of native heart with stable angina pectoris (HCC) 05/20/2016    PCP: [name], PA   REFERRING PROVIDER: [name], PA   REFERRING DIAG: R53.1 (ICD-10-CM) - Weakness M25.60 (ICD-10-CM) - Decreased range of motion  Rationale for Evaluation and Treatment: Rehabilitation  THERAPY DIAG:  Other abnormalities of gait and mobility  Muscle weakness (generalized)  Physical deconditioning  ONSET DATE: 07/10/22  SUBJECTIVE:                                                                                                                                                                                           SUBJECTIVE STATEMENT: Patient reports that he fell in his garden recently and scraped his elbow. He also states that he had cataract surgery last week and his balance has been a "little wobbly" since then due to blurry vision. He states he doesn't get a new prescription for his glasses for a couple more weeks.   PERTINENT HISTORY:  Patient had statin induced rhabdomyolysis, myalgia has resolved but he continues to experience weakness, tightness, limited ROM specifically in legs and lower back, shoulders and neck.  PAIN:  Are you having pain? No  PRECAUTIONS: Fall  WEIGHT BEARING RESTRICTIONS: No  FALLS:  Has patient fallen in last 6 months? Yes. Number of falls 1  OCCUPATION: retired  PLOF: Independent  PATIENT GOALS: To regain my LE strength and mobility  NEXT MD VISIT: PRN  OBJECTIVE:   DIAGNOSTIC FINDINGS:  none  PATIENT SURVEYS:  FOTO 49(64 predicted)  SCREENING FOR RED FLAGS: Bowel or bladder incontinence: No  MUSCLE  LENGTH: N/T  POSTURE: rounded shoulders, forward head, and decreased lumbar lordosis   LUMBAR ROM: deferred  AROM eval  Flexion   Extension   Right lateral flexion   Left lateral flexion   Right rotation   Left  rotation    (Blank rows = not tested)  LOWER EXTREMITY ROM:   WFL for gait, bed mobility and transfers  Active  Right eval Left eval  Hip flexion    Hip extension    Hip abduction    Hip adduction    Hip internal rotation    Hip external rotation    Knee flexion    Knee extension    Ankle dorsiflexion    Ankle plantarflexion    Ankle inversion    Ankle eversion     (Blank rows = not tested)  LOWER EXTREMITY MMT:    MMT Right eval Left eval  Hip flexion 3+ 4-  Hip extension 3+ 4-  Hip abduction 3+ 4-  Hip adduction    Hip internal rotation    Hip external rotation    Knee flexion 3+ 4-  Knee extension 3+ 4-  Ankle dorsiflexion    Ankle plantarflexion 3+ 4-  Ankle inversion    Ankle eversion     (Blank rows = not tested)  LUMBAR SPECIAL TESTS:  Slump test: Negative  FUNCTIONAL TESTS:  5 times sit to stand: 27s with UE Support 2 MWT with cane 27ft  GAIT: Distance walked: 22ftx2 Assistive device utilized: Single point cane Level of assistance: Complete Independence Comments: slow cadence  TODAY'S TREATMENT:     [facility] Adult PT Treatment:                                                DATE: 10/28/22 Therapeutic Exercise: Nustep level 5 x 8 mins Sidestepping at counter x 4 laps Heel raise at counter 2x10 Standing marching at counter 3x30" Seated pball push down TA activation 3" hold 2x10 STS x10 no UE Therapeutic Activity: Hurdle step overs at counter fwd/lat - 4 hurdles Step ups 4" with airex on top x10 fwd BIL Step ups lateral onto airex x10 BIL Mini squats on airex      [facility] Adult PT Treatment:                                                DATE: 10/17/22 Therapeutic Exercise: Supine QL stretch 30s x2 B Open book 10/10 Supine  march 10/10 PPT 3s hold 10x  Therapeutic Activity:   10/17/22 0001  Berg Balance Test  Sit to Stand 4  Standing Unsupported 4  Sitting with Back Unsupported but Feet Supported on Floor or Stool 4  Stand to Sit 4  Transfers 4  Standing Unsupported with Eyes Closed 4  Standing Unsupported with Feet Together 4  From Standing, Reach Forward with Outstretched Arm 4  From Standing Position, Pick up Object from Floor 3  From Standing Position, Turn to Look Behind Over each Shoulder 4  Turn 360 Degrees 2  Standing Unsupported, Alternately Place Feet on Step/Stool 4  Standing Unsupported, One Foot in Front 3  Standing on One Leg 2  Total Score 50  DATE: 10/07/22 Eval and HEP   PATIENT EDUCATION:  Education details: Discussed eval findings, rehab rationale and POC and patient is in agreement  Person educated: Patient Education method: Explanation Education comprehension: verbalized understanding and needs further education  HOME EXERCISE PROGRAM: Access Code: [email or web address] URL: [email or web address] Date: 10/17/2022 Prepared by: Gustavus Bryant  Exercises - Sit to Stand with Armchair  - 2 x daily - 5 x weekly - 1 sets - 5 reps - Standing Heel Raise with Support  - 2 x daily - 5 x weekly - 2 sets - 10 reps - Supine Bridge  - 2 x daily - 5 x weekly - 2 sets - 10 reps - Curl Up with Reach  - 2 x daily - 5 x weekly - 2 sets - 10 reps - Supine March with Posterior Pelvic Tilt  - 2 x daily - 5 x weekly - 2 sets - 10 reps - 30s hold - Sidelying Open Book Thoracic Lumbar Rotation and Extension  - 2 x daily - 5 x weekly - 1 sets - 10 reps  ASSESSMENT:  CLINICAL IMPRESSION:  Patient presents to PT reporting that he fell in his garden and he also had cataract surgery last week and his vision is affecting his balance. Session today focused on LE and core  strengthening and stabilization as well as dynamic balance tasks to challenge balance while moving to decrease his fall risk with his daily activities. Patient was able to tolerate all prescribed exercises with no adverse effects. Patient continues to benefit from skilled PT services and should be progressed as able to improve functional independence.    OBJECTIVE IMPAIRMENTS: Abnormal gait, decreased activity tolerance, decreased balance, decreased coordination, decreased endurance, decreased mobility, difficulty walking, decreased strength, and postural dysfunction.   ACTIVITY LIMITATIONS: carrying, lifting, standing, squatting, stairs, transfers, and bed mobility  PERSONAL FACTORS: Age, Past/current experiences, and Time since onset of injury/illness/exacerbation are also affecting patient's functional outcome.   REHAB POTENTIAL: Good  CLINICAL DECISION MAKING: Evolving/moderate complexity  EVALUATION COMPLEXITY: Low   GOALS: Goals reviewed with patient? No  SHORT TERM GOALS: Target date: 10/21/2022  Patient to demonstrate independence in HEP Baseline: [email or web address] Goal status: INITIAL  2.  500 ft ambulation with SPC  Baseline: 211ft w/cane Goal status: INITIAL  LONG TERM GOALS: Target date: 12/08/22  Decrease 5x STS time to 20s arms crossed Baseline: 27s arms used Goal status: INITIAL  2.  Increase distance on 2 MWT to 376ft w/cane Baseline: 213ft w/cane Goal status: INITIAL  3.  Increase BLE strength to 4/5 Baseline:  MMT Right eval Left eval  Hip flexion 3+ 4-  Hip extension 3+ 4-  Hip abduction 3+ 4-  Hip adduction    Hip internal rotation    Hip external rotation    Knee flexion 3+ 4-  Knee extension 3+ 4-  Ankle dorsiflexion    Ankle plantarflexion 3+ 4-   Goal status: INITIAL  4.  Increase FOTO score to 64 Baseline: 49 Goal status: INITIAL   PLAN:  PT FREQUENCY: 2x/week  PT DURATION: 4 weeks  PLANNED INTERVENTIONS: Therapeutic exercises,  Therapeutic activity, Neuromuscular re-education, Balance training, Gait training, Patient/Family education, Self Care, Joint mobilization, Aquatic Therapy, Dry Needling, Electrical stimulation, Cryotherapy, Moist heat, Manual therapy, and Re-evaluation.  PLAN FOR NEXT SESSION: HEP review and update, manual techniques as appropriate, aerobic tasks, ROM and flexibility activities, strengthening and PREs, TPDN, gait and balance training as needed     Berta Minor, PTA 10/28/2022, 12:04 PM

## 2022-10-29 ENCOUNTER — Ambulatory Visit: Payer: Medicare HMO | Admitting: Internal Medicine

## 2022-10-29 MED ORDER — REPATHA SURECLICK 140 MG/ML ~~LOC~~ SOAJ: 140.0000 mg | SUBCUTANEOUS | 3 refills | Status: DC

## 2022-10-29 NOTE — Therapy (Deleted)
OUTPATIENT PHYSICAL THERAPY THORACOLUMBAR    Patient Name: Jacob Harrison MRN: [id number] DOB:[birth year], 82 y.o., male Today's Date: 10/29/2022  END OF SESSION:     Past Medical History:  Diagnosis Date   (HFpEF) heart failure with preserved ejection fraction (HCC)    a. 10/2016 Echo: EF 65-70%, mild LVH, mildly dil LA. RV fxn nl.   Arthritis    BPH (benign prostatic hyperplasia)    Coronary artery disease    a. 2001 CABG x 3 [facility]): LIMA->LAD, VG->LCX, VG->RPDA; b. 04/05/2010 Cath ([facility]): LM 47m, LAD 172m, LCX 22m, RCA 100p, LIMA->LAD atretic, VG->LCX 100, VG->RCA 80ost, 70d; c. 04/2010 Redo CABG x 2 ([facility]): VG->LAD, VG->RPDA; d. 10/2012 Cath [facility]): LM 20-30, LAD 100/95-69m, LCX 40-25m, RCA 100p, 80-100d, RPDA 99, RPL1 95, VG->LAD ok, VG->RPDA ok, EF 67%; e. 11/2016 MV: EF 48%, No ischemia.   Diabetes mellitus without complication (HCC)    Type II   Hyperlipidemia    Hypertension    Ischemic cardiomyopathy    a. 03/2021 s/p MDT Visia AF MRI VR SureScan single lead AICD (ser# [id number]).   Scoliosis    Spinal stenosis    Past Surgical History:  Procedure Laterality Date   CARDIAC CATHETERIZATION     CORONARY ARTERY BYPASS GRAFT     x 2 (2001 and redo in late 2011 or early 2012)   HERNIA REPAIR Left    inguinal   ICD IMPLANT N/A 03/20/2021   Procedure: ICD IMPLANT;  Surgeon: [name], MD;  Location: [facility] INVASIVE CV LAB;  Service: Cardiovascular;  Laterality: N/A;   LEFT HEART CATH AND CORS/GRAFTS ANGIOGRAPHY N/A 10/04/2020   Procedure: LEFT HEART CATH AND CORS/GRAFTS ANGIOGRAPHY;  Surgeon: [name], MD;  Location: [facility] INVASIVE CV LAB;  Service: Cardiovascular;  Laterality: N/A;   LUMBAR LAMINECTOMY/DECOMPRESSION MICRODISCECTOMY N/A 10/20/2018   Procedure: L3-4, L4-5 LUMBAR DECOMPRESSION with dural repair.;  Surgeon: [name], MD;  Location: [facility] OR;  Service: Orthopedics;  Laterality: N/A;   RIGHT/LEFT HEART CATH AND CORONARY/GRAFT  ANGIOGRAPHY N/A 05/27/2022   Procedure: RIGHT/LEFT HEART CATH AND CORONARY/GRAFT ANGIOGRAPHY;  Surgeon: [name], MD;  Location: [facility] INVASIVE CV LAB;  Service: Cardiovascular;  Laterality: N/A;   TONSILLECTOMY AND ADENOIDECTOMY     TRANSURETHRAL RESECTION OF PROSTATE  1990   Patient Active Problem List   Diagnosis Date Noted   Statins contraindicated 10/16/2022   Paroxysmal atrial fibrillation (HCC) 08/27/2022   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) 08/27/2022   ICD (implantable cardioverter-defibrillator) in place 08/05/2022   Rhabdomyolysis 07/05/2022   Transaminitis 07/05/2022   Leukocytosis 07/05/2022   Chronic low back pain 07/02/2022   Dyspnea on exertion 05/22/2022   Chronic HFrEF (heart failure with reduced ejection fraction) (HCC) 12/13/2021   Acute on chronic heart failure with preserved ejection fraction (HFpEF) (HCC) 10/08/2020   Non-ST elevation (NSTEMI) myocardial infarction (HCC) 10/08/2020   CHF (congestive heart failure) (HCC) 10/04/2020   Hx of CABG    Accelerating angina (HCC) 10/03/2020   Rotator cuff disorder 08/09/2019   BPH associated with nocturia 11/29/2018   Status post lumbar spine surgery for decompression of spinal cord 10/29/2018   Spinal stenosis of lumbar region 08/02/2018   Atypical mole 07/30/2017   Wheezing 07/21/2017   Sinus bradycardia 03/18/2017   Ventral hernia without obstruction or gangrene 02/25/2017   (HFpEF) heart failure with preserved ejection fraction (HCC) 09/28/2016   Ischemic cardiomyopathy 09/28/2016   Vitamin D deficiency 06/27/2016   Vitamin B deficiency 06/27/2016  Type 2 diabetes mellitus without complication, without long-term current use of insulin (HCC) 05/20/2016   Essential hypertension 05/20/2016   Hyperlipidemia associated with type 2 diabetes mellitus (HCC) 05/20/2016   Coronary artery disease of native artery of native heart with stable angina pectoris (HCC) 05/20/2016    PCP:  [name], PA   REFERRING PROVIDER: [name], PA   REFERRING DIAG: R53.1 (ICD-10-CM) - Weakness M25.60 (ICD-10-CM) - Decreased range of motion  Rationale for Evaluation and Treatment: Rehabilitation  THERAPY DIAG:  No diagnosis found.  ONSET DATE: 07/10/22  SUBJECTIVE:                                                                                                                                                                                           SUBJECTIVE STATEMENT: Patient reports that he fell in his garden recently and scraped his elbow. He also states that he had cataract surgery last week and his balance has been a "little wobbly" since then due to blurry vision. He states he doesn't get a new prescription for his glasses for a couple more weeks.   PERTINENT HISTORY:  Patient had statin induced rhabdomyolysis, myalgia has resolved but he continues to experience weakness, tightness, limited ROM specifically in legs and lower back, shoulders and neck.  PAIN:  Are you having pain? No  PRECAUTIONS: Fall  WEIGHT BEARING RESTRICTIONS: No  FALLS:  Has patient fallen in last 6 months? Yes. Number of falls 1  OCCUPATION: retired  PLOF: Independent  PATIENT GOALS: To regain my LE strength and mobility  NEXT MD VISIT: PRN  OBJECTIVE:   DIAGNOSTIC FINDINGS:  none  PATIENT SURVEYS:  FOTO 49(64 predicted)  SCREENING FOR RED FLAGS: Bowel or bladder incontinence: No  MUSCLE LENGTH: N/T  POSTURE: rounded shoulders, forward head, and decreased lumbar lordosis   LUMBAR ROM: deferred  AROM eval  Flexion   Extension   Right lateral flexion   Left lateral flexion   Right rotation   Left rotation    (Blank rows = not tested)  LOWER EXTREMITY ROM:   WFL for gait, bed mobility and transfers  Active  Right eval Left eval  Hip flexion    Hip extension    Hip abduction    Hip adduction    Hip internal rotation    Hip external rotation     Knee flexion    Knee extension    Ankle dorsiflexion    Ankle plantarflexion    Ankle inversion    Ankle eversion     (Blank rows = not tested)  LOWER EXTREMITY MMT:    MMT Right  eval Left eval  Hip flexion 3+ 4-  Hip extension 3+ 4-  Hip abduction 3+ 4-  Hip adduction    Hip internal rotation    Hip external rotation    Knee flexion 3+ 4-  Knee extension 3+ 4-  Ankle dorsiflexion    Ankle plantarflexion 3+ 4-  Ankle inversion    Ankle eversion     (Blank rows = not tested)  LUMBAR SPECIAL TESTS:  Slump test: Negative  FUNCTIONAL TESTS:  5 times sit to stand: 27s with UE Support 2 MWT with cane 265ft  GAIT: Distance walked: 15ftx2 Assistive device utilized: Single point cane Level of assistance: Complete Independence Comments: slow cadence  TODAY'S TREATMENT:     [facility] Adult PT Treatment:                                                DATE: 10/28/22 Therapeutic Exercise: Nustep level 5 x 8 mins Sidestepping at counter x 4 laps Heel raise at counter 2x10 Standing marching at counter 3x30" Seated pball push down TA activation 3" hold 2x10 STS x10 no UE Therapeutic Activity: Hurdle step overs at counter fwd/lat - 4 hurdles Step ups 4" with airex on top x10 fwd BIL Step ups lateral onto airex x10 BIL Mini squats on airex      [facility] Adult PT Treatment:                                                DATE: 10/17/22 Therapeutic Exercise: Supine QL stretch 30s x2 B Open book 10/10 Supine march 10/10 PPT 3s hold 10x  Therapeutic Activity:   10/17/22 0001  Berg Balance Test  Sit to Stand 4  Standing Unsupported 4  Sitting with Back Unsupported but Feet Supported on Floor or Stool 4  Stand to Sit 4  Transfers 4  Standing Unsupported with Eyes Closed 4  Standing Unsupported with Feet Together 4  From Standing, Reach Forward with Outstretched Arm 4  From Standing Position, Pick up Object from Floor 3  From Standing Position, Turn to Look Behind Over each  Shoulder 4  Turn 360 Degrees 2  Standing Unsupported, Alternately Place Feet on Step/Stool 4  Standing Unsupported, One Foot in Front 3  Standing on One Leg 2  Total Score 50                                                                                                                         DATE: 10/07/22 Eval and HEP   PATIENT EDUCATION:  Education details: Discussed eval findings, rehab rationale and POC and patient is in agreement  Person educated: Patient Education method: Explanation Education comprehension: verbalized understanding and needs further education  HOME EXERCISE PROGRAM: Access Code: [email or web address] URL: [email or web address] Date: 10/17/2022 Prepared by: Gustavus Bryant  Exercises - Sit to Stand with Armchair  - 2 x daily - 5 x weekly - 1 sets - 5 reps - Standing Heel Raise with Support  - 2 x daily - 5 x weekly - 2 sets - 10 reps - Supine Bridge  - 2 x daily - 5 x weekly - 2 sets - 10 reps - Curl Up with Reach  - 2 x daily - 5 x weekly - 2 sets - 10 reps - Supine March with Posterior Pelvic Tilt  - 2 x daily - 5 x weekly - 2 sets - 10 reps - 30s hold - Sidelying Open Book Thoracic Lumbar Rotation and Extension  - 2 x daily - 5 x weekly - 1 sets - 10 reps  ASSESSMENT:  CLINICAL IMPRESSION:  Patient presents to PT reporting that he fell in his garden and he also had cataract surgery last week and his vision is affecting his balance. Session today focused on LE and core strengthening and stabilization as well as dynamic balance tasks to challenge balance while moving to decrease his fall risk with his daily activities. Patient was able to tolerate all prescribed exercises with no adverse effects. Patient continues to benefit from skilled PT services and should be progressed as able to improve functional independence.    OBJECTIVE IMPAIRMENTS: Abnormal gait, decreased activity tolerance, decreased balance, decreased coordination, decreased endurance,  decreased mobility, difficulty walking, decreased strength, and postural dysfunction.   ACTIVITY LIMITATIONS: carrying, lifting, standing, squatting, stairs, transfers, and bed mobility  PERSONAL FACTORS: Age, Past/current experiences, and Time since onset of injury/illness/exacerbation are also affecting patient's functional outcome.   REHAB POTENTIAL: Good  CLINICAL DECISION MAKING: Evolving/moderate complexity  EVALUATION COMPLEXITY: Low   GOALS: Goals reviewed with patient? No  SHORT TERM GOALS: Target date: 10/21/2022  Patient to demonstrate independence in HEP Baseline: [email or web address] Goal status: INITIAL  2.  500 ft ambulation with SPC  Baseline: 267ft w/cane Goal status: INITIAL  LONG TERM GOALS: Target date: 12/08/22  Decrease 5x STS time to 20s arms crossed Baseline: 27s arms used Goal status: INITIAL  2.  Increase distance on 2 MWT to 338ft w/cane Baseline: 2109ft w/cane Goal status: INITIAL  3.  Increase BLE strength to 4/5 Baseline:  MMT Right eval Left eval  Hip flexion 3+ 4-  Hip extension 3+ 4-  Hip abduction 3+ 4-  Hip adduction    Hip internal rotation    Hip external rotation    Knee flexion 3+ 4-  Knee extension 3+ 4-  Ankle dorsiflexion    Ankle plantarflexion 3+ 4-   Goal status: INITIAL  4.  Increase FOTO score to 64 Baseline: 49 Goal status: INITIAL   PLAN:  PT FREQUENCY: 2x/week  PT DURATION: 4 weeks  PLANNED INTERVENTIONS: Therapeutic exercises, Therapeutic activity, Neuromuscular re-education, Balance training, Gait training, Patient/Family education, Self Care, Joint mobilization, Aquatic Therapy, Dry Needling, Electrical stimulation, Cryotherapy, Moist heat, Manual therapy, and Re-evaluation.  PLAN FOR NEXT SESSION: HEP review and update, manual techniques as appropriate, aerobic tasks, ROM and flexibility activities, strengthening and PREs, TPDN, gait and balance training as needed     Hildred Laser, PT 10/29/2022, 1:42 PM

## 2022-10-31 ENCOUNTER — Ambulatory Visit: Payer: Medicare HMO

## 2022-11-03 NOTE — Therapy (Signed)
OUTPATIENT PHYSICAL THERAPY THORACOLUMBAR    Patient Name: Jacob Harrison MRN: [id number] DOB:[birth year], 82 y.o., male Today's Date: 11/04/2022  END OF SESSION:  PT End of Session - 11/04/22 1314     Visit Number 4    Number of Visits 8    Date for PT Re-Evaluation 12/02/22    Authorization Type [employer] MCR    Progress Note Due on Visit 10    PT Start Time 1315    PT Stop Time 1355    PT Time Calculation (min) 40 min    Activity Tolerance Patient tolerated treatment well    Behavior During Therapy WFL for tasks assessed/performed               Past Medical History:  Diagnosis Date   (HFpEF) heart failure with preserved ejection fraction (HCC)    a. 10/2016 Echo: EF 65-70%, mild LVH, mildly dil LA. RV fxn nl.   Arthritis    BPH (benign prostatic hyperplasia)    Coronary artery disease    a. 2001 CABG x 3 [facility]): LIMA->LAD, VG->LCX, VG->RPDA; b. 04/05/2010 Cath ([place]): LM 29m, LAD 180m, LCX 37m, RCA 100p, LIMA->LAD atretic, VG->LCX 100, VG->RCA 80ost, 70d; c. 04/2010 Redo CABG x 2 ([place]): VG->LAD, VG->RPDA; d. 10/2012 Cath [facility]): LM 20-30, LAD 100/95-25m, LCX 40-53m, RCA 100p, 80-100d, RPDA 99, RPL1 95, VG->LAD ok, VG->RPDA ok, EF 67%; e. 11/2016 MV: EF 48%, No ischemia.   Diabetes mellitus without complication (HCC)    Type II   Hyperlipidemia    Hypertension    Ischemic cardiomyopathy    a. 03/2021 s/p MDT Visia AF MRI VR SureScan single lead AICD (ser# [id number]).   Scoliosis    Spinal stenosis    Past Surgical History:  Procedure Laterality Date   CARDIAC CATHETERIZATION     CORONARY ARTERY BYPASS GRAFT     x 2 (2001 and redo in late 2011 or early 2012)   HERNIA REPAIR Left    inguinal   ICD IMPLANT N/A 03/20/2021   Procedure: ICD IMPLANT;  Surgeon: [name], MD;  Location: [facility] INVASIVE CV LAB;  Service: Cardiovascular;  Laterality: N/A;   LEFT HEART CATH AND CORS/GRAFTS ANGIOGRAPHY N/A 10/04/2020   Procedure: LEFT HEART CATH AND  CORS/GRAFTS ANGIOGRAPHY;  Surgeon: [name], MD;  Location: [facility] INVASIVE CV LAB;  Service: Cardiovascular;  Laterality: N/A;   LUMBAR LAMINECTOMY/DECOMPRESSION MICRODISCECTOMY N/A 10/20/2018   Procedure: L3-4, L4-5 LUMBAR DECOMPRESSION with dural repair.;  Surgeon: [name], MD;  Location: [facility] OR;  Service: Orthopedics;  Laterality: N/A;   RIGHT/LEFT HEART CATH AND CORONARY/GRAFT ANGIOGRAPHY N/A 05/27/2022   Procedure: RIGHT/LEFT HEART CATH AND CORONARY/GRAFT ANGIOGRAPHY;  Surgeon: [name], MD;  Location: [facility] INVASIVE CV LAB;  Service: Cardiovascular;  Laterality: N/A;   TONSILLECTOMY AND ADENOIDECTOMY     TRANSURETHRAL RESECTION OF PROSTATE  1990   Patient Active Problem List   Diagnosis Date Noted   Statins contraindicated 10/16/2022   Paroxysmal atrial fibrillation (HCC) 08/27/2022   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) 08/27/2022   ICD (implantable cardioverter-defibrillator) in place 08/05/2022   Rhabdomyolysis 07/05/2022   Transaminitis 07/05/2022   Leukocytosis 07/05/2022   Chronic low back pain 07/02/2022   Dyspnea on exertion 05/22/2022   Chronic HFrEF (heart failure with reduced ejection fraction) (HCC) 12/13/2021   Acute on chronic heart failure with preserved ejection fraction (HFpEF) (HCC) 10/08/2020   Non-ST elevation (NSTEMI) myocardial infarction (HCC) 10/08/2020   CHF (congestive heart failure) (HCC)  10/04/2020   Hx of CABG    Accelerating angina (HCC) 10/03/2020   Rotator cuff disorder 08/09/2019   BPH associated with nocturia 11/29/2018   Status post lumbar spine surgery for decompression of spinal cord 10/29/2018   Spinal stenosis of lumbar region 08/02/2018   Atypical mole 07/30/2017   Wheezing 07/21/2017   Sinus bradycardia 03/18/2017   Ventral hernia without obstruction or gangrene 02/25/2017   (HFpEF) heart failure with preserved ejection fraction (HCC) 09/28/2016   Ischemic cardiomyopathy 09/28/2016   Vitamin  D deficiency 06/27/2016   Vitamin B deficiency 06/27/2016   Type 2 diabetes mellitus without complication, without long-term current use of insulin (HCC) 05/20/2016   Essential hypertension 05/20/2016   Hyperlipidemia associated with type 2 diabetes mellitus (HCC) 05/20/2016   Coronary artery disease of native artery of native heart with stable angina pectoris (HCC) 05/20/2016    PCP: [name], PA   REFERRING PROVIDER: [name], PA   REFERRING DIAG: R53.1 (ICD-10-CM) - Weakness M25.60 (ICD-10-CM) - Decreased range of motion  Rationale for Evaluation and Treatment: Rehabilitation  THERAPY DIAG:  Other abnormalities of gait and mobility  Muscle weakness (generalized)  Physical deconditioning  ONSET DATE: 07/10/22  SUBJECTIVE:                                                                                                                                                                                           SUBJECTIVE STATEMENT: Feeling better overall but continues to cite RLE weakness.  Feels R sided strength deficits are responsible for any mobility and balance issues  PERTINENT HISTORY:  Patient had statin induced rhabdomyolysis, myalgia has resolved but he continues to experience weakness, tightness, limited ROM specifically in legs and lower back, shoulders and neck.  PAIN:  Are you having pain? No  PRECAUTIONS: Fall  WEIGHT BEARING RESTRICTIONS: No  FALLS:  Has patient fallen in last 6 months? Yes. Number of falls 1  OCCUPATION: retired  PLOF: Independent  PATIENT GOALS: To regain my LE strength and mobility  NEXT MD VISIT: PRN  OBJECTIVE:   DIAGNOSTIC FINDINGS:  none  PATIENT SURVEYS:  FOTO 49(64 predicted)  SCREENING FOR RED FLAGS: Bowel or bladder incontinence: No  MUSCLE LENGTH: N/T  POSTURE: rounded shoulders, forward head, and decreased lumbar lordosis   LUMBAR ROM: deferred  AROM eval  Flexion   Extension   Right  lateral flexion   Left lateral flexion   Right rotation   Left rotation    (Blank rows = not tested)  LOWER EXTREMITY ROM:   WFL for gait, bed mobility and transfers  Active  Right eval Left  eval  Hip flexion    Hip extension    Hip abduction    Hip adduction    Hip internal rotation    Hip external rotation    Knee flexion    Knee extension    Ankle dorsiflexion    Ankle plantarflexion    Ankle inversion    Ankle eversion     (Blank rows = not tested)  LOWER EXTREMITY MMT:    MMT Right eval Left eval  Hip flexion 3+ 4-  Hip extension 3+ 4-  Hip abduction 3+ 4-  Hip adduction    Hip internal rotation    Hip external rotation    Knee flexion 3+ 4-  Knee extension 3+ 4-  Ankle dorsiflexion    Ankle plantarflexion 3+ 4-  Ankle inversion    Ankle eversion     (Blank rows = not tested)  LUMBAR SPECIAL TESTS:  Slump test: Negative  FUNCTIONAL TESTS:  5 times sit to stand: 27s with UE Support 2 MWT with cane 241ft  GAIT: Distance walked: 76ftx2 Assistive device utilized: Single point cane Level of assistance: Complete Independence Comments: slow cadence  TODAY'S TREATMENT:     [facility] Adult PT Treatment:                                                DATE: 11/04/22 Therapeutic Exercise: Ambulation of 555ft with with SPC  R SLR 15x Supine hip fallouts Blu TB 15x B, 15/15 unilaterally S/L R clams BluTB 15x SAQs R 2# 15x Supine march 15/15 2# WS onto 6 in step A/P 10/10, lateral 10x B no UE support  [facility] Adult PT Treatment:                                                DATE: 10/28/22 Therapeutic Exercise: Nustep level 5 x 8 mins Sidestepping at counter x 4 laps Heel raise at counter 2x10 Standing marching at counter 3x30" Seated pball push down TA activation 3" hold 2x10 STS x10 no UE Therapeutic Activity: Hurdle step overs at counter fwd/lat - 4 hurdles Step ups 4" with airex on top x10 fwd BIL Step ups lateral onto airex x10 BIL Mini squats on  airex      [facility] Adult PT Treatment:                                                DATE: 10/17/22 Therapeutic Exercise: Supine QL stretch 30s x2 B Open book 10/10 Supine march 10/10 PPT 3s hold 10x  Therapeutic Activity:   10/17/22 0001  Berg Balance Test  Sit to Stand 4  Standing Unsupported 4  Sitting with Back Unsupported but Feet Supported on Floor or Stool 4  Stand to Sit 4  Transfers 4  Standing Unsupported with Eyes Closed 4  Standing Unsupported with Feet Together 4  From Standing, Reach Forward with Outstretched Arm 4  From Standing Position, Pick up Object from Floor 3  From Standing Position, Turn to Look Behind Over each Shoulder 4  Turn 360 Degrees 2  Standing Unsupported, Alternately Place Feet  on Step/Stool 4  Standing Unsupported, One Foot in Front 3  Standing on One Leg 2  Total Score 50                                                                                                                         DATE: 10/07/22 Eval and HEP   PATIENT EDUCATION:  Education details: Discussed eval findings, rehab rationale and POC and patient is in agreement  Person educated: Patient Education method: Explanation Education comprehension: verbalized understanding and needs further education  HOME EXERCISE PROGRAM: Access Code: [email or web address] URL: [email or web address] Date: 10/17/2022 Prepared by: Gustavus Bryant  Exercises - Sit to Stand with Armchair  - 2 x daily - 5 x weekly - 1 sets - 5 reps - Standing Heel Raise with Support  - 2 x daily - 5 x weekly - 2 sets - 10 reps - Supine Bridge  - 2 x daily - 5 x weekly - 2 sets - 10 reps - Curl Up with Reach  - 2 x daily - 5 x weekly - 2 sets - 10 reps - Supine March with Posterior Pelvic Tilt  - 2 x daily - 5 x weekly - 2 sets - 10 reps - 30s hold - Sidelying Open Book Thoracic Lumbar Rotation and Extension  - 2 x daily - 5 x weekly - 1 sets - 10 reps  ASSESSMENT:  CLINICAL IMPRESSION: Ambulation  tolerance assessed with patient ambulating 548ft using cane.  Treatment focus was LE strengthening with emphasis on R side primarily.  Incorporated proprioception and stepping tasks.   OBJECTIVE IMPAIRMENTS: Abnormal gait, decreased activity tolerance, decreased balance, decreased coordination, decreased endurance, decreased mobility, difficulty walking, decreased strength, and postural dysfunction.   ACTIVITY LIMITATIONS: carrying, lifting, standing, squatting, stairs, transfers, and bed mobility  PERSONAL FACTORS: Age, Past/current experiences, and Time since onset of injury/illness/exacerbation are also affecting patient's functional outcome.   REHAB POTENTIAL: Good  CLINICAL DECISION MAKING: Evolving/moderate complexity  EVALUATION COMPLEXITY: Low   GOALS: Goals reviewed with patient? No  SHORT TERM GOALS: Target date: 10/21/2022  Patient to demonstrate independence in HEP Baseline: [email or web address] Goal status: MET  2.  500 ft ambulation with SPC  Baseline: 257ft w/cane; 11/04/22 577ft w/cane Goal status: Met  LONG TERM GOALS: Target date: 12/08/22  Decrease 5x STS time to 20s arms crossed Baseline: 27s arms used Goal status: INITIAL  2.  Increase distance on 2 MWT to 364ft w/cane Baseline: 283ft w/cane Goal status: INITIAL  3.  Increase BLE strength to 4/5 Baseline:  MMT Right eval Left eval  Hip flexion 3+ 4-  Hip extension 3+ 4-  Hip abduction 3+ 4-  Hip adduction    Hip internal rotation    Hip external rotation    Knee flexion 3+ 4-  Knee extension 3+ 4-  Ankle dorsiflexion    Ankle plantarflexion 3+ 4-   Goal status: INITIAL  4.  Increase FOTO score to 64 Baseline: 49 Goal status: INITIAL  PLAN:  PT FREQUENCY: 2x/week  PT DURATION: 4 weeks  PLANNED INTERVENTIONS: Therapeutic exercises, Therapeutic activity, Neuromuscular re-education, Balance training, Gait training, Patient/Family education, Self Care, Joint mobilization, Aquatic Therapy, Dry  Needling, Electrical stimulation, Cryotherapy, Moist heat, Manual therapy, and Re-evaluation.  PLAN FOR NEXT SESSION: HEP review and update, manual techniques as appropriate, aerobic tasks, ROM and flexibility activities, strengthening and PREs, TPDN, gait and balance training as needed     Hildred Laser, PT 11/04/2022, 1:59 PM

## 2022-11-04 ENCOUNTER — Ambulatory Visit: Payer: Medicare HMO

## 2022-11-04 DIAGNOSIS — M2560 Stiffness of unspecified joint, not elsewhere classified: Secondary | ICD-10-CM | POA: Diagnosis not present

## 2022-11-04 DIAGNOSIS — R531 Weakness: Secondary | ICD-10-CM | POA: Diagnosis not present

## 2022-11-04 DIAGNOSIS — R2689 Other abnormalities of gait and mobility: Secondary | ICD-10-CM | POA: Diagnosis not present

## 2022-11-04 DIAGNOSIS — R5381 Other malaise: Secondary | ICD-10-CM | POA: Diagnosis not present

## 2022-11-04 DIAGNOSIS — M6281 Muscle weakness (generalized): Secondary | ICD-10-CM

## 2022-11-04 NOTE — Therapy (Signed)
OUTPATIENT PHYSICAL THERAPY THORACOLUMBAR    Patient Name: Jacob Harrison MRN: [id number] DOB:[birth year], 82 y.o., male Today's Date: 11/07/2022  END OF SESSION:  PT End of Session - 11/07/22 1040     Visit Number 5    Number of Visits 8    Date for PT Re-Evaluation 12/02/22    Authorization Type [employer] MCR    Progress Note Due on Visit 10    PT Start Time 1045    PT Stop Time 1125    PT Time Calculation (min) 40 min    Activity Tolerance Patient tolerated treatment well    Behavior During Therapy WFL for tasks assessed/performed                Past Medical History:  Diagnosis Date   (HFpEF) heart failure with preserved ejection fraction (HCC)    a. 10/2016 Echo: EF 65-70%, mild LVH, mildly dil LA. RV fxn nl.   Arthritis    BPH (benign prostatic hyperplasia)    Coronary artery disease    a. 2001 CABG x 3 [facility]): LIMA->LAD, VG->LCX, VG->RPDA; b. 04/05/2010 Cath ([place]): LM 53m, LAD 179m, LCX 19m, RCA 100p, LIMA->LAD atretic, VG->LCX 100, VG->RCA 80ost, 70d; c. 04/2010 Redo CABG x 2 ([place]): VG->LAD, VG->RPDA; d. 10/2012 Cath [facility]): LM 20-30, LAD 100/95-42m, LCX 40-41m, RCA 100p, 80-100d, RPDA 99, RPL1 95, VG->LAD ok, VG->RPDA ok, EF 67%; e. 11/2016 MV: EF 48%, No ischemia.   Diabetes mellitus without complication (HCC)    Type II   Hyperlipidemia    Hypertension    Ischemic cardiomyopathy    a. 03/2021 s/p MDT Visia AF MRI VR SureScan single lead AICD (ser# [id number]).   Scoliosis    Spinal stenosis    Past Surgical History:  Procedure Laterality Date   CARDIAC CATHETERIZATION     CORONARY ARTERY BYPASS GRAFT     x 2 (2001 and redo in late 2011 or early 2012)   HERNIA REPAIR Left    inguinal   ICD IMPLANT N/A 03/20/2021   Procedure: ICD IMPLANT;  Surgeon: [name], MD;  Location: [facility] INVASIVE CV LAB;  Service: Cardiovascular;  Laterality: N/A;   LEFT HEART CATH AND CORS/GRAFTS ANGIOGRAPHY N/A 10/04/2020   Procedure: LEFT HEART CATH AND  CORS/GRAFTS ANGIOGRAPHY;  Surgeon: [name], MD;  Location: [facility] INVASIVE CV LAB;  Service: Cardiovascular;  Laterality: N/A;   LUMBAR LAMINECTOMY/DECOMPRESSION MICRODISCECTOMY N/A 10/20/2018   Procedure: L3-4, L4-5 LUMBAR DECOMPRESSION with dural repair.;  Surgeon: [name], MD;  Location: [facility] OR;  Service: Orthopedics;  Laterality: N/A;   RIGHT/LEFT HEART CATH AND CORONARY/GRAFT ANGIOGRAPHY N/A 05/27/2022   Procedure: RIGHT/LEFT HEART CATH AND CORONARY/GRAFT ANGIOGRAPHY;  Surgeon: [name], MD;  Location: [facility] INVASIVE CV LAB;  Service: Cardiovascular;  Laterality: N/A;   TONSILLECTOMY AND ADENOIDECTOMY     TRANSURETHRAL RESECTION OF PROSTATE  1990   Patient Active Problem List   Diagnosis Date Noted   Statins contraindicated 10/16/2022   Paroxysmal atrial fibrillation (HCC) 08/27/2022   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) 08/27/2022   ICD (implantable cardioverter-defibrillator) in place 08/05/2022   Rhabdomyolysis 07/05/2022   Transaminitis 07/05/2022   Leukocytosis 07/05/2022   Chronic low back pain 07/02/2022   Dyspnea on exertion 05/22/2022   Chronic HFrEF (heart failure with reduced ejection fraction) (HCC) 12/13/2021   Acute on chronic heart failure with preserved ejection fraction (HFpEF) (HCC) 10/08/2020   Non-ST elevation (NSTEMI) myocardial infarction (HCC) 10/08/2020   CHF (congestive heart failure) (  HCC) 10/04/2020   Hx of CABG    Accelerating angina (HCC) 10/03/2020   Rotator cuff disorder 08/09/2019   BPH associated with nocturia 11/29/2018   Status post lumbar spine surgery for decompression of spinal cord 10/29/2018   Spinal stenosis of lumbar region 08/02/2018   Atypical mole 07/30/2017   Wheezing 07/21/2017   Sinus bradycardia 03/18/2017   Ventral hernia without obstruction or gangrene 02/25/2017   (HFpEF) heart failure with preserved ejection fraction (HCC) 09/28/2016   Ischemic cardiomyopathy 09/28/2016   Vitamin  D deficiency 06/27/2016   Vitamin B deficiency 06/27/2016   Type 2 diabetes mellitus without complication, without long-term current use of insulin (HCC) 05/20/2016   Essential hypertension 05/20/2016   Hyperlipidemia associated with type 2 diabetes mellitus (HCC) 05/20/2016   Coronary artery disease of native artery of native heart with stable angina pectoris (HCC) 05/20/2016    PCP: [name], PA   REFERRING PROVIDER: [name], PA   REFERRING DIAG: R53.1 (ICD-10-CM) - Weakness M25.60 (ICD-10-CM) - Decreased range of motion  Rationale for Evaluation and Treatment: Rehabilitation  THERAPY DIAG:  Other abnormalities of gait and mobility  Muscle weakness (generalized)  Physical deconditioning  ONSET DATE: 07/10/22  SUBJECTIVE:                                                                                                                                                                                           SUBJECTIVE STATEMENT:  Was able to walk through garden w/o need of AD.  Normally has walking stick for balance aid.  PERTINENT HISTORY:  Patient had statin induced rhabdomyolysis, myalgia has resolved but he continues to experience weakness, tightness, limited ROM specifically in legs and lower back, shoulders and neck.  PAIN:  Are you having pain? No  PRECAUTIONS: Fall  WEIGHT BEARING RESTRICTIONS: No  FALLS:  Has patient fallen in last 6 months? Yes. Number of falls 1  OCCUPATION: retired  PLOF: Independent  PATIENT GOALS: To regain my LE strength and mobility  NEXT MD VISIT: PRN  OBJECTIVE:   DIAGNOSTIC FINDINGS:  none  PATIENT SURVEYS:  FOTO 49(64 predicted)  SCREENING FOR RED FLAGS: Bowel or bladder incontinence: No  MUSCLE LENGTH: N/T  POSTURE: rounded shoulders, forward head, and decreased lumbar lordosis   LUMBAR ROM: deferred  AROM eval  Flexion   Extension   Right lateral flexion   Left lateral flexion   Right  rotation   Left rotation    (Blank rows = not tested)  LOWER EXTREMITY ROM:   WFL for gait, bed mobility and transfers  Active  Right eval Left eval  Hip  flexion    Hip extension    Hip abduction    Hip adduction    Hip internal rotation    Hip external rotation    Knee flexion    Knee extension    Ankle dorsiflexion    Ankle plantarflexion    Ankle inversion    Ankle eversion     (Blank rows = not tested)  LOWER EXTREMITY MMT:    MMT Right eval Left eval  Hip flexion 3+ 4-  Hip extension 3+ 4-  Hip abduction 3+ 4-  Hip adduction    Hip internal rotation    Hip external rotation    Knee flexion 3+ 4-  Knee extension 3+ 4-  Ankle dorsiflexion    Ankle plantarflexion 3+ 4-  Ankle inversion    Ankle eversion     (Blank rows = not tested)  LUMBAR SPECIAL TESTS:  Slump test: Negative  FUNCTIONAL TESTS:  5 times sit to stand: 27s with UE Support 2 MWT with cane 231ft  GAIT: Distance walked: 71ftx2 Assistive device utilized: Single point cane Level of assistance: Complete Independence Comments: slow cadence  TODAY'S TREATMENT:     [facility] Adult PT Treatment:                                                DATE: 11/07/22 Therapeutic Exercise: Bridge with ball 15x Curl ups with ball for proprioception 15x Supine hip fallouts Blu TB 15x B, 15/15 unilaterally Bridge against BluTB 15x R clams BluTB 15x Runners step 6 in 10/10 B heel raises from DF position 10x  Therapeutic Activity:   11/07/22 0001  Dynamic Gait Index  Level Surface 2  Change in Gait Speed 3  Gait with Horizontal Head Turns 3  Gait with Vertical Head Turns 2  Gait and Pivot Turn 2  Step Over Obstacle 3  Step Around Obstacles 3  Steps 2  Total Score 20    [facility] Adult PT Treatment:                                                DATE: 11/04/22 Therapeutic Exercise: Ambulation of 557ft with with SPC  R SLR 15x Supine hip fallouts Blu TB 15x B, 15/15 unilaterally S/L R clams BluTB  15x SAQs R 2# 15x Supine march 15/15 2# WS onto 6 in step A/P 10/10, lateral 10x B no UE support  [facility] Adult PT Treatment:                                                DATE: 10/28/22 Therapeutic Exercise: Nustep level 5 x 8 mins Sidestepping at counter x 4 laps Heel raise at counter 2x10 Standing marching at counter 3x30" Seated pball push down TA activation 3" hold 2x10 STS x10 no UE Therapeutic Activity: Hurdle step overs at counter fwd/lat - 4 hurdles Step ups 4" with airex on top x10 fwd BIL Step ups lateral onto airex x10 BIL Mini squats on airex      [facility] Adult PT Treatment:  DATE: 10/17/22 Therapeutic Exercise: Supine QL stretch 30s x2 B Open book 10/10 Supine march 10/10 PPT 3s hold 10x  Therapeutic Activity:   10/17/22 0001  Berg Balance Test  Sit to Stand 4  Standing Unsupported 4  Sitting with Back Unsupported but Feet Supported on Floor or Stool 4  Stand to Sit 4  Transfers 4  Standing Unsupported with Eyes Closed 4  Standing Unsupported with Feet Together 4  From Standing, Reach Forward with Outstretched Arm 4  From Standing Position, Pick up Object from Floor 3  From Standing Position, Turn to Look Behind Over each Shoulder 4  Turn 360 Degrees 2  Standing Unsupported, Alternately Place Feet on Step/Stool 4  Standing Unsupported, One Foot in Front 3  Standing on One Leg 2  Total Score 50                                                                                                                         DATE: 10/07/22 Eval and HEP   PATIENT EDUCATION:  Education details: Discussed eval findings, rehab rationale and POC and patient is in agreement  Person educated: Patient Education method: Explanation Education comprehension: verbalized understanding and needs further education  HOME EXERCISE PROGRAM: Access Code: [email or web address] URL: [email or web address] Date: 10/17/2022 Prepared by:  Gustavus Bryant  Exercises - Sit to Stand with Armchair  - 2 x daily - 5 x weekly - 1 sets - 5 reps - Standing Heel Raise with Support  - 2 x daily - 5 x weekly - 2 sets - 10 reps - Supine Bridge  - 2 x daily - 5 x weekly - 2 sets - 10 reps - Curl Up with Reach  - 2 x daily - 5 x weekly - 2 sets - 10 reps - Supine March with Posterior Pelvic Tilt  - 2 x daily - 5 x weekly - 2 sets - 10 reps - 30s hold - Sidelying Open Book Thoracic Lumbar Rotation and Extension  - 2 x daily - 5 x weekly - 1 sets - 10 reps  ASSESSMENT:  CLINICAL IMPRESSION: Assessed DGI today with score of 20/24 indicating good dynamic balance.  Initially unsteady with pivot turns due to spinal mobility issues which he is able to correct with pacing and planning.  Increased resistance on tasks and added additional closed chain exercises for balance and strength.   OBJECTIVE IMPAIRMENTS: Abnormal gait, decreased activity tolerance, decreased balance, decreased coordination, decreased endurance, decreased mobility, difficulty walking, decreased strength, and postural dysfunction.   ACTIVITY LIMITATIONS: carrying, lifting, standing, squatting, stairs, transfers, and bed mobility  PERSONAL FACTORS: Age, Past/current experiences, and Time since onset of injury/illness/exacerbation are also affecting patient's functional outcome.   REHAB POTENTIAL: Good  CLINICAL DECISION MAKING: Evolving/moderate complexity  EVALUATION COMPLEXITY: Low   GOALS: Goals reviewed with patient? No  SHORT TERM GOALS: Target date: 10/21/2022  Patient to demonstrate independence in HEP Baseline: [email or web address] Goal status: MET  2.  500 ft ambulation with  SPC  Baseline: 2108ft w/cane; 11/04/22 5104ft w/cane Goal status: Met  LONG TERM GOALS: Target date: 12/08/22  Decrease 5x STS time to 20s arms crossed Baseline: 27s arms used Goal status: INITIAL  2.  Increase distance on 2 MWT to 342ft w/cane Baseline: 21ft w/cane Goal status: INITIAL  3.   Increase BLE strength to 4/5 Baseline:  MMT Right eval Left eval  Hip flexion 3+ 4-  Hip extension 3+ 4-  Hip abduction 3+ 4-  Hip adduction    Hip internal rotation    Hip external rotation    Knee flexion 3+ 4-  Knee extension 3+ 4-  Ankle dorsiflexion    Ankle plantarflexion 3+ 4-   Goal status: INITIAL  4.  Increase FOTO score to 64 Baseline: 49 Goal status: INITIAL   PLAN:  PT FREQUENCY: 2x/week  PT DURATION: 4 weeks  PLANNED INTERVENTIONS: Therapeutic exercises, Therapeutic activity, Neuromuscular re-education, Balance training, Gait training, Patient/Family education, Self Care, Joint mobilization, Aquatic Therapy, Dry Needling, Electrical stimulation, Cryotherapy, Moist heat, Manual therapy, and Re-evaluation.  PLAN FOR NEXT SESSION: HEP review and update, manual techniques as appropriate, aerobic tasks, ROM and flexibility activities, strengthening and PREs, TPDN, gait and balance training as needed     Hildred Laser, PT 11/07/2022, 11:35 AM

## 2022-11-06 ENCOUNTER — Ambulatory Visit: Payer: Medicare HMO | Admitting: Dermatology

## 2022-11-07 ENCOUNTER — Ambulatory Visit: Payer: Medicare HMO | Attending: Family Medicine

## 2022-11-07 DIAGNOSIS — R5381 Other malaise: Secondary | ICD-10-CM | POA: Diagnosis not present

## 2022-11-07 DIAGNOSIS — R2689 Other abnormalities of gait and mobility: Secondary | ICD-10-CM | POA: Insufficient documentation

## 2022-11-07 DIAGNOSIS — M6281 Muscle weakness (generalized): Secondary | ICD-10-CM | POA: Insufficient documentation

## 2022-11-11 ENCOUNTER — Ambulatory Visit: Payer: Medicare HMO

## 2022-11-11 DIAGNOSIS — R2689 Other abnormalities of gait and mobility: Secondary | ICD-10-CM

## 2022-11-11 DIAGNOSIS — M6281 Muscle weakness (generalized): Secondary | ICD-10-CM | POA: Diagnosis not present

## 2022-11-11 DIAGNOSIS — R5381 Other malaise: Secondary | ICD-10-CM | POA: Diagnosis not present

## 2022-11-11 NOTE — Therapy (Signed)
OUTPATIENT PHYSICAL THERAPY TREATMENT NOTE    Patient Name: Nixen Avalo MRN: [id number] DOB:1940-08-26, 82 y.o., male Today's Date: 11/11/2022  END OF SESSION:  PT End of Session - 11/11/22 1438     Visit Number 6    Number of Visits 8    Date for PT Re-Evaluation 12/02/22    Authorization Type [employer] MCR    Progress Note Due on Visit 10    PT Start Time 1445    PT Stop Time 1523    PT Time Calculation (min) 38 min    Equipment Utilized During Treatment Gait belt    Activity Tolerance Patient tolerated treatment well    Behavior During Therapy WFL for tasks assessed/performed              Past Medical History:  Diagnosis Date   (HFpEF) heart failure with preserved ejection fraction (HCC)    a. 10/2016 Echo: EF 65-70%, mild LVH, mildly dil LA. RV fxn nl.   Arthritis    BPH (benign prostatic hyperplasia)    Coronary artery disease    a. 2001 CABG x 3 [facility]): LIMA->LAD, VG->LCX, VG->RPDA; b. 04/05/2010 Cath ([place]): LM 72m, LAD 148m, LCX 52m, RCA 100p, LIMA->LAD atretic, VG->LCX 100, VG->RCA 80ost, 70d; c. 04/2010 Redo CABG x 2 ([place]): VG->LAD, VG->RPDA; d. 10/2012 Cath [facility]): LM 20-30, LAD 100/95-28m, LCX 40-63m, RCA 100p, 80-100d, RPDA 99, RPL1 95, VG->LAD ok, VG->RPDA ok, EF 67%; e. 11/2016 MV: EF 48%, No ischemia.   Diabetes mellitus without complication (HCC)    Type II   Hyperlipidemia    Hypertension    Ischemic cardiomyopathy    a. 03/2021 s/p MDT Visia AF MRI VR SureScan single lead AICD (ser# [id number]).   Scoliosis    Spinal stenosis    Past Surgical History:  Procedure Laterality Date   CARDIAC CATHETERIZATION     CORONARY ARTERY BYPASS GRAFT     x 2 (2001 and redo in late 2011 or early 2012)   HERNIA REPAIR Left    inguinal   ICD IMPLANT N/A 03/20/2021   Procedure: ICD IMPLANT;  Surgeon: [name], MD;  Location: [facility] INVASIVE CV LAB;  Service: Cardiovascular;  Laterality: N/A;   LEFT HEART CATH AND CORS/GRAFTS ANGIOGRAPHY  N/A 10/04/2020   Procedure: LEFT HEART CATH AND CORS/GRAFTS ANGIOGRAPHY;  Surgeon: [name], MD;  Location: [facility] INVASIVE CV LAB;  Service: Cardiovascular;  Laterality: N/A;   LUMBAR LAMINECTOMY/DECOMPRESSION MICRODISCECTOMY N/A 10/20/2018   Procedure: L3-4, L4-5 LUMBAR DECOMPRESSION with dural repair.;  Surgeon: [name], MD;  Location: [facility] OR;  Service: Orthopedics;  Laterality: N/A;   RIGHT/LEFT HEART CATH AND CORONARY/GRAFT ANGIOGRAPHY N/A 05/27/2022   Procedure: RIGHT/LEFT HEART CATH AND CORONARY/GRAFT ANGIOGRAPHY;  Surgeon: [name], MD;  Location: [facility] INVASIVE CV LAB;  Service: Cardiovascular;  Laterality: N/A;   TONSILLECTOMY AND ADENOIDECTOMY     TRANSURETHRAL RESECTION OF PROSTATE  1990   Patient Active Problem List   Diagnosis Date Noted   Statins contraindicated 10/16/2022   Paroxysmal atrial fibrillation (HCC) 08/27/2022   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) 08/27/2022   ICD (implantable cardioverter-defibrillator) in place 08/05/2022   Rhabdomyolysis 07/05/2022   Transaminitis 07/05/2022   Leukocytosis 07/05/2022   Chronic low back pain 07/02/2022   Dyspnea on exertion 05/22/2022   Chronic HFrEF (heart failure with reduced ejection fraction) (HCC) 12/13/2021   Acute on chronic heart failure with preserved ejection fraction (HFpEF) (HCC) 10/08/2020   Non-ST elevation (NSTEMI) myocardial infarction (  HCC) 10/08/2020   CHF (congestive heart failure) (HCC) 10/04/2020   Hx of CABG    Accelerating angina (HCC) 10/03/2020   Rotator cuff disorder 08/09/2019   BPH associated with nocturia 11/29/2018   Status post lumbar spine surgery for decompression of spinal cord 10/29/2018   Spinal stenosis of lumbar region 08/02/2018   Atypical mole 07/30/2017   Wheezing 07/21/2017   Sinus bradycardia 03/18/2017   Ventral hernia without obstruction or gangrene 02/25/2017   (HFpEF) heart failure with preserved ejection fraction (HCC) 09/28/2016    Ischemic cardiomyopathy 09/28/2016   Vitamin D deficiency 06/27/2016   Vitamin B deficiency 06/27/2016   Type 2 diabetes mellitus without complication, without long-term current use of insulin (HCC) 05/20/2016   Essential hypertension 05/20/2016   Hyperlipidemia associated with type 2 diabetes mellitus (HCC) 05/20/2016   Coronary artery disease of native artery of native heart with stable angina pectoris (HCC) 05/20/2016    PCP: [name], PA   REFERRING PROVIDER: [name], PA   REFERRING DIAG: R53.1 (ICD-10-CM) - Weakness M25.60 (ICD-10-CM) - Decreased range of motion  Rationale for Evaluation and Treatment: Rehabilitation  THERAPY DIAG:  Other abnormalities of gait and mobility  Muscle weakness (generalized)  Physical deconditioning  ONSET DATE: 07/10/22  SUBJECTIVE:                                                                                                                                                                                           SUBJECTIVE STATEMENT:  Patient reports that he went through his garden this past weekend. He states that the home exercises do make him sore.   PERTINENT HISTORY:  Patient had statin induced rhabdomyolysis, myalgia has resolved but he continues to experience weakness, tightness, limited ROM specifically in legs and lower back, shoulders and neck.  PAIN:  Are you having pain? No  PRECAUTIONS: Fall  WEIGHT BEARING RESTRICTIONS: No  FALLS:  Has patient fallen in last 6 months? Yes. Number of falls 1  OCCUPATION: retired  PLOF: Independent  PATIENT GOALS: To regain my LE strength and mobility  NEXT MD VISIT: PRN  OBJECTIVE:   DIAGNOSTIC FINDINGS:  none  PATIENT SURVEYS:  FOTO 49(64 predicted) 11/11/22: 45%  SCREENING FOR RED FLAGS: Bowel or bladder incontinence: No  MUSCLE LENGTH: N/T  POSTURE: rounded shoulders, forward head, and decreased lumbar lordosis   LUMBAR ROM: deferred  AROM  eval  Flexion   Extension   Right lateral flexion   Left lateral flexion   Right rotation   Left rotation    (Blank rows = not tested)  LOWER EXTREMITY ROM:   WFL for  gait, bed mobility and transfers  Active  Right eval Left eval  Hip flexion    Hip extension    Hip abduction    Hip adduction    Hip internal rotation    Hip external rotation    Knee flexion    Knee extension    Ankle dorsiflexion    Ankle plantarflexion    Ankle inversion    Ankle eversion     (Blank rows = not tested)  LOWER EXTREMITY MMT:    MMT Right eval Left eval  Hip flexion 3+ 4-  Hip extension 3+ 4-  Hip abduction 3+ 4-  Hip adduction    Hip internal rotation    Hip external rotation    Knee flexion 3+ 4-  Knee extension 3+ 4-  Ankle dorsiflexion    Ankle plantarflexion 3+ 4-  Ankle inversion    Ankle eversion     (Blank rows = not tested)  LUMBAR SPECIAL TESTS:  Slump test: Negative  FUNCTIONAL TESTS:  5 times sit to stand: 27s with UE Support 2 MWT with cane 285ft  GAIT: Distance walked: 95ftx2 Assistive device utilized: Single point cane Level of assistance: Complete Independence Comments: slow cadence  TODAY'S TREATMENT:     [facility] Adult PT Treatment:                                                DATE: 11/11/22 Therapeutic Exercise: Nustep level 5 x 5 mins Runners step 6 in 10/10 B heel raises from DF position 2x10 Neuromuscular re-ed: Hurdle step overs at counter lateral - 4 hurdles x2 laps Step ups 4" with airex on top x10 fwd BIL Mini squats on airex x10 Obstacle course: 3 hurdles, 2" step with airex step up and over, figure 8 weaving around foam rollers - with cane and gait belt   [facility] Adult PT Treatment:                                                DATE: 11/07/22 Therapeutic Exercise: Bridge with ball 15x Curl ups with ball for proprioception 15x Supine hip fallouts Blu TB 15x B, 15/15 unilaterally Bridge against BluTB 15x R clams BluTB 15x Runners step 6  in 10/10 B heel raises from DF position 10x  Therapeutic Activity:   11/07/22 0001  Dynamic Gait Index  Level Surface 2  Change in Gait Speed 3  Gait with Horizontal Head Turns 3  Gait with Vertical Head Turns 2  Gait and Pivot Turn 2  Step Over Obstacle 3  Step Around Obstacles 3  Steps 2  Total Score 20    [facility] Adult PT Treatment:                                                DATE: 11/04/22 Therapeutic Exercise: Ambulation of 556ft with with SPC  R SLR 15x Supine hip fallouts Blu TB 15x B, 15/15 unilaterally S/L R clams BluTB 15x SAQs R 2# 15x Supine march 15/15 2# WS onto 6 in step A/P 10/10, lateral 10x B no UE support  [facility]  Adult PT Treatment:                                                DATE: 10/28/22 Therapeutic Exercise: Nustep level 5 x 8 mins Sidestepping at counter x 4 laps Heel raise at counter 2x10 Standing marching at counter 3x30" Seated pball push down TA activation 3" hold 2x10 STS x10 no UE Therapeutic Activity: Hurdle step overs at counter fwd/lat - 4 hurdles Step ups 4" with airex on top x10 fwd BIL Step ups lateral onto airex x10 BIL Mini squats on airex      [facility] Adult PT Treatment:                                                DATE: 10/17/22 Therapeutic Exercise: Supine QL stretch 30s x2 B Open book 10/10 Supine march 10/10 PPT 3s hold 10x  Therapeutic Activity:   10/17/22 0001  Berg Balance Test  Sit to Stand 4  Standing Unsupported 4  Sitting with Back Unsupported but Feet Supported on Floor or Stool 4  Stand to Sit 4  Transfers 4  Standing Unsupported with Eyes Closed 4  Standing Unsupported with Feet Together 4  From Standing, Reach Forward with Outstretched Arm 4  From Standing Position, Pick up Object from Floor 3  From Standing Position, Turn to Look Behind Over each Shoulder 4  Turn 360 Degrees 2  Standing Unsupported, Alternately Place Feet on Step/Stool 4  Standing Unsupported, One Foot in Front 3  Standing on One  Leg 2  Total Score 50                                                                                                                         DATE: 10/07/22 Eval and HEP   PATIENT EDUCATION:  Education details: Discussed eval findings, rehab rationale and POC and patient is in agreement  Person educated: Patient Education method: Explanation Education comprehension: verbalized understanding and needs further education  HOME EXERCISE PROGRAM: Access Code: [email or web address] URL: [email or web address] Date: 10/17/2022 Prepared by: Gustavus Bryant  Exercises - Sit to Stand with Armchair  - 2 x daily - 5 x weekly - 1 sets - 5 reps - Standing Heel Raise with Support  - 2 x daily - 5 x weekly - 2 sets - 10 reps - Supine Bridge  - 2 x daily - 5 x weekly - 2 sets - 10 reps - Curl Up with Reach  - 2 x daily - 5 x weekly - 2 sets - 10 reps - Supine March with Posterior Pelvic Tilt  - 2 x daily - 5 x weekly - 2 sets - 10 reps - 30s hold - Sidelying Open  Book Thoracic Lumbar Rotation and Extension  - 2 x daily - 5 x weekly - 1 sets - 10 reps  ASSESSMENT:  CLINICAL IMPRESSION:  Patient presents to PT reporting some soreness from completing home exercises. Introduced obstacle course balance tasks today to moderate effect, he required use of his cane and close CGA with gait belt. He has trouble clearing hurdles with BIL LE but improves with cuing and practice. Patient was able to tolerate all prescribed exercises with no adverse effects. Patient continues to benefit from skilled PT services and should be progressed as able to improve functional independence.     OBJECTIVE IMPAIRMENTS: Abnormal gait, decreased activity tolerance, decreased balance, decreased coordination, decreased endurance, decreased mobility, difficulty walking, decreased strength, and postural dysfunction.   ACTIVITY LIMITATIONS: carrying, lifting, standing, squatting, stairs, transfers, and bed mobility  PERSONAL FACTORS:  Age, Past/current experiences, and Time since onset of injury/illness/exacerbation are also affecting patient's functional outcome.   REHAB POTENTIAL: Good  CLINICAL DECISION MAKING: Evolving/moderate complexity  EVALUATION COMPLEXITY: Low   GOALS: Goals reviewed with patient? No  SHORT TERM GOALS: Target date: 10/21/2022  Patient to demonstrate independence in HEP Baseline: [email or web address] Goal status: MET  2.  500 ft ambulation with SPC  Baseline: 220ft w/cane; 11/04/22 568ft w/cane Goal status: Met  LONG TERM GOALS: Target date: 12/08/22  Decrease 5x STS time to 20s arms crossed Baseline: 27s arms used Goal status: INITIAL  2.  Increase distance on 2 MWT to 352ft w/cane Baseline: 287ft w/cane Goal status: INITIAL  3.  Increase BLE strength to 4/5 Baseline:  MMT Right eval Left eval  Hip flexion 3+ 4-  Hip extension 3+ 4-  Hip abduction 3+ 4-  Hip adduction    Hip internal rotation    Hip external rotation    Knee flexion 3+ 4-  Knee extension 3+ 4-  Ankle dorsiflexion    Ankle plantarflexion 3+ 4-   Goal status: INITIAL  4.  Increase FOTO score to 64 Baseline: 49 Goal status: INITIAL   PLAN:  PT FREQUENCY: 2x/week  PT DURATION: 4 weeks  PLANNED INTERVENTIONS: Therapeutic exercises, Therapeutic activity, Neuromuscular re-education, Balance training, Gait training, Patient/Family education, Self Care, Joint mobilization, Aquatic Therapy, Dry Needling, Electrical stimulation, Cryotherapy, Moist heat, Manual therapy, and Re-evaluation.  PLAN FOR NEXT SESSION: HEP review and update, manual techniques as appropriate, aerobic tasks, ROM and flexibility activities, strengthening and PREs, TPDN, gait and balance training as needed     Berta Minor, PTA 11/11/2022, 3:27 PM

## 2022-11-12 ENCOUNTER — Telehealth: Payer: Self-pay | Admitting: Pharmacist

## 2022-11-12 NOTE — Telephone Encounter (Signed)
Contacted [employer] patient assistance regarding Eliquis patient assistance. Patient was denied patient assistance because he exceeded the yearly income limit

## 2022-11-16 ENCOUNTER — Other Ambulatory Visit: Payer: Self-pay | Admitting: Internal Medicine

## 2022-11-17 NOTE — Therapy (Unsigned)
OUTPATIENT PHYSICAL THERAPY TREATMENT NOTE    Patient Name: Jacob Harrison MRN: [id number] DOB:05/13/1940, 82 y.o., male Today's Date: 11/19/2022  END OF SESSION:  PT End of Session - 11/19/22 1443     Visit Number 7    Number of Visits 8    Date for PT Re-Evaluation 12/02/22    Authorization Type [employer] MCR    Progress Note Due on Visit 10    PT Start Time 1445    PT Stop Time 1525    PT Time Calculation (min) 40 min    Equipment Utilized During Treatment Gait belt    Activity Tolerance Patient tolerated treatment well    Behavior During Therapy WFL for tasks assessed/performed               Past Medical History:  Diagnosis Date   (HFpEF) heart failure with preserved ejection fraction (HCC)    a. 10/2016 Echo: EF 65-70%, mild LVH, mildly dil LA. RV fxn nl.   Arthritis    BPH (benign prostatic hyperplasia)    Coronary artery disease    a. 2001 CABG x 3 [facility]): LIMA->LAD, VG->LCX, VG->RPDA; b. 04/05/2010 Cath ([place]): LM 61m, LAD 182m, LCX 38m, RCA 100p, LIMA->LAD atretic, VG->LCX 100, VG->RCA 80ost, 70d; c. 04/2010 Redo CABG x 2 ([place]): VG->LAD, VG->RPDA; d. 10/2012 Cath [facility]): LM 20-30, LAD 100/95-57m, LCX 40-52m, RCA 100p, 80-100d, RPDA 99, RPL1 95, VG->LAD ok, VG->RPDA ok, EF 67%; e. 11/2016 MV: EF 48%, No ischemia.   Diabetes mellitus without complication (HCC)    Type II   Hyperlipidemia    Hypertension    Ischemic cardiomyopathy    a. 03/2021 s/p MDT Visia AF MRI VR SureScan single lead AICD (ser# [id number]).   Scoliosis    Spinal stenosis    Past Surgical History:  Procedure Laterality Date   CARDIAC CATHETERIZATION     CORONARY ARTERY BYPASS GRAFT     x 2 (2001 and redo in late 2011 or early 2012)   HERNIA REPAIR Left    inguinal   ICD IMPLANT N/A 03/20/2021   Procedure: ICD IMPLANT;  Surgeon: [name], MD;  Location: [facility] INVASIVE CV LAB;  Service: Cardiovascular;  Laterality: N/A;   LEFT HEART CATH AND CORS/GRAFTS ANGIOGRAPHY  N/A 10/04/2020   Procedure: LEFT HEART CATH AND CORS/GRAFTS ANGIOGRAPHY;  Surgeon: [name], MD;  Location: [facility] INVASIVE CV LAB;  Service: Cardiovascular;  Laterality: N/A;   LUMBAR LAMINECTOMY/DECOMPRESSION MICRODISCECTOMY N/A 10/20/2018   Procedure: L3-4, L4-5 LUMBAR DECOMPRESSION with dural repair.;  Surgeon: [name], MD;  Location: [facility] OR;  Service: Orthopedics;  Laterality: N/A;   RIGHT/LEFT HEART CATH AND CORONARY/GRAFT ANGIOGRAPHY N/A 05/27/2022   Procedure: RIGHT/LEFT HEART CATH AND CORONARY/GRAFT ANGIOGRAPHY;  Surgeon: [name], MD;  Location: [facility] INVASIVE CV LAB;  Service: Cardiovascular;  Laterality: N/A;   TONSILLECTOMY AND ADENOIDECTOMY     TRANSURETHRAL RESECTION OF PROSTATE  1990   Patient Active Problem List   Diagnosis Date Noted   Muscle weakness 11/19/2022   Statins contraindicated 10/16/2022   Paroxysmal atrial fibrillation (HCC) 08/27/2022   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) 08/27/2022   ICD (implantable cardioverter-defibrillator) in place 08/05/2022   Rhabdomyolysis 07/05/2022   Transaminitis 07/05/2022   Leukocytosis 07/05/2022   Chronic low back pain 07/02/2022   Dyspnea on exertion 05/22/2022   Chronic HFrEF (heart failure with reduced ejection fraction) (HCC) 12/13/2021   Acute on chronic heart failure with preserved ejection fraction (HFpEF) (HCC) 10/08/2020  Non-ST elevation (NSTEMI) myocardial infarction (HCC) 10/08/2020   CHF (congestive heart failure) (HCC) 10/04/2020   Hx of CABG    Accelerating angina (HCC) 10/03/2020   Rotator cuff disorder 08/09/2019   BPH associated with nocturia 11/29/2018   Status post lumbar spine surgery for decompression of spinal cord 10/29/2018   Spinal stenosis of lumbar region 08/02/2018   Atypical mole 07/30/2017   Wheezing 07/21/2017   Sinus bradycardia 03/18/2017   Ventral hernia without obstruction or gangrene 02/25/2017   (HFpEF) heart failure with preserved  ejection fraction (HCC) 09/28/2016   Ischemic cardiomyopathy 09/28/2016   Vitamin D deficiency 06/27/2016   Vitamin B deficiency 06/27/2016   Type 2 diabetes mellitus without complication, without long-term current use of insulin (HCC) 05/20/2016   Essential hypertension 05/20/2016   Hyperlipidemia associated with type 2 diabetes mellitus (HCC) 05/20/2016   Coronary artery disease of native artery of native heart with stable angina pectoris (HCC) 05/20/2016    PCP: [name], PA   REFERRING PROVIDER: [name], PA   REFERRING DIAG: R53.1 (ICD-10-CM) - Weakness M25.60 (ICD-10-CM) - Decreased range of motion  Rationale for Evaluation and Treatment: Rehabilitation  THERAPY DIAG:  Other abnormalities of gait and mobility  Physical deconditioning  Muscle weakness (generalized)  ONSET DATE: 07/10/22  SUBJECTIVE:                                                                                                                                                                                           SUBJECTIVE STATEMENT:  Doing well overall but RLE remains weaker than LLE,  especially in L hip flexion and DF  PERTINENT HISTORY:  Patient had statin induced rhabdomyolysis, myalgia has resolved but he continues to experience weakness, tightness, limited ROM specifically in legs and lower back, shoulders and neck.  PAIN:  Are you having pain? No  PRECAUTIONS: Fall  WEIGHT BEARING RESTRICTIONS: No  FALLS:  Has patient fallen in last 6 months? Yes. Number of falls 1  OCCUPATION: retired  PLOF: Independent  PATIENT GOALS: To regain my LE strength and mobility  NEXT MD VISIT: PRN  OBJECTIVE:   DIAGNOSTIC FINDINGS:  none  PATIENT SURVEYS:  FOTO 49(64 predicted) 11/11/22: 45%  SCREENING FOR RED FLAGS: Bowel or bladder incontinence: No  MUSCLE LENGTH: N/T  POSTURE: rounded shoulders, forward head, and decreased lumbar lordosis   LUMBAR ROM: deferred  AROM  eval  Flexion   Extension   Right lateral flexion   Left lateral flexion   Right rotation   Left rotation    (Blank rows = not tested)  LOWER EXTREMITY ROM:   WFL for  gait, bed mobility and transfers  Active  Right eval Left eval  Hip flexion    Hip extension    Hip abduction    Hip adduction    Hip internal rotation    Hip external rotation    Knee flexion    Knee extension    Ankle dorsiflexion    Ankle plantarflexion    Ankle inversion    Ankle eversion     (Blank rows = not tested)  LOWER EXTREMITY MMT:    MMT Right eval Left eval  Hip flexion 3+ 4-  Hip extension 3+ 4-  Hip abduction 3+ 4-  Hip adduction    Hip internal rotation    Hip external rotation    Knee flexion 3+ 4-  Knee extension 3+ 4-  Ankle dorsiflexion    Ankle plantarflexion 3+ 4-  Ankle inversion    Ankle eversion     (Blank rows = not tested)  LUMBAR SPECIAL TESTS:  Slump test: Negative  FUNCTIONAL TESTS:  5 times sit to stand: 27s with UE Support 2 MWT with cane 220ft  GAIT: Distance walked: 92ftx2 Assistive device utilized: Single point cane Level of assistance: Complete Independence Comments: slow cadence  TODAY'S TREATMENT:     [facility] Adult PT Treatment:                                                DATE: 11/19/22 Therapeutic Exercise: 2 MWT 246ft w/ cane Nustep L5 8 min HR 96, O2 sats 96% Supine hip fallouts Blu TB 15x B, 15/15 unilaterally Bridge against BluTB 15x B clams BluTB 15x March against wall 10/10  [facility] Adult PT Treatment:                                                DATE: 11/11/22 Therapeutic Exercise: Nustep level 5 x 5 mins Runners step 6 in 10/10 B heel raises from DF position 2x10 Neuromuscular re-ed: Hurdle step overs at counter lateral - 4 hurdles x2 laps Step ups 4" with airex on top x10 fwd BIL Mini squats on airex x10 Obstacle course: 3 hurdles, 2" step with airex step up and over, figure 8 weaving around foam rollers - with cane and gait  belt   [facility] Adult PT Treatment:                                                DATE: 11/07/22 Therapeutic Exercise: Bridge with ball 15x Curl ups with ball for proprioception 15x Supine hip fallouts Blu TB 15x B, 15/15 unilaterally Bridge against BluTB 15x R clams BluTB 15x Runners step 6 in 10/10 B heel raises from DF position 10x  Therapeutic Activity:   11/07/22 0001  Dynamic Gait Index  Level Surface 2  Change in Gait Speed 3  Gait with Horizontal Head Turns 3  Gait with Vertical Head Turns 2  Gait and Pivot Turn 2  Step Over Obstacle 3  Step Around Obstacles 3  Steps 2  Total Score 20    [facility] Adult PT Treatment:  DATE: 11/04/22 Therapeutic Exercise: Ambulation of 564ft with with SPC  R SLR 15x Supine hip fallouts Blu TB 15x B, 15/15 unilaterally S/L R clams BluTB 15x SAQs R 2# 15x Supine march 15/15 2# WS onto 6 in step A/P 10/10, lateral 10x B no UE support  [facility] Adult PT Treatment:                                                DATE: 10/28/22 Therapeutic Exercise: Nustep level 5 x 8 mins Sidestepping at counter x 4 laps Heel raise at counter 2x10 Standing marching at counter 3x30" Seated pball push down TA activation 3" hold 2x10 STS x10 no UE Therapeutic Activity: Hurdle step overs at counter fwd/lat - 4 hurdles Step ups 4" with airex on top x10 fwd BIL Step ups lateral onto airex x10 BIL Mini squats on airex      [facility] Adult PT Treatment:                                                DATE: 10/17/22 Therapeutic Exercise: Supine QL stretch 30s x2 B Open book 10/10 Supine march 10/10 PPT 3s hold 10x  Therapeutic Activity:   10/17/22 0001  Berg Balance Test  Sit to Stand 4  Standing Unsupported 4  Sitting with Back Unsupported but Feet Supported on Floor or Stool 4  Stand to Sit 4  Transfers 4  Standing Unsupported with Eyes Closed 4  Standing Unsupported with Feet Together 4  From Standing, Reach  Forward with Outstretched Arm 4  From Standing Position, Pick up Object from Floor 3  From Standing Position, Turn to Look Behind Over each Shoulder 4  Turn 360 Degrees 2  Standing Unsupported, Alternately Place Feet on Step/Stool 4  Standing Unsupported, One Foot in Front 3  Standing on One Leg 2  Total Score 50                                                                                                                         DATE: 10/07/22 Eval and HEP   PATIENT EDUCATION:  Education details: Discussed eval findings, rehab rationale and POC and patient is in agreement  Person educated: Patient Education method: Explanation Education comprehension: verbalized understanding and needs further education  HOME EXERCISE PROGRAM: Access Code: [email or web address] URL: [email or web address] Date: 10/17/2022 Prepared by: Gustavus Bryant  Exercises - Sit to Stand with Armchair  - 2 x daily - 5 x weekly - 1 sets - 5 reps - Standing Heel Raise with Support  - 2 x daily - 5 x weekly - 2 sets - 10 reps - Supine Bridge  - 2 x daily - 5 x weekly - 2 sets -  10 reps - Curl Up with Reach  - 2 x daily - 5 x weekly - 2 sets - 10 reps - Supine March with Posterior Pelvic Tilt  - 2 x daily - 5 x weekly - 2 sets - 10 reps - 30s hold - Sidelying Open Book Thoracic Lumbar Rotation and Extension  - 2 x daily - 5 x weekly - 1 sets - 10 reps  ASSESSMENT:  CLINICAL IMPRESSION: Goals re-assessed and STS met.  2 MWT distance has not increased but remains stable.   Recommend continuing OPPT 1w4 following next session to address continued weakness.   OBJECTIVE IMPAIRMENTS: Abnormal gait, decreased activity tolerance, decreased balance, decreased coordination, decreased endurance, decreased mobility, difficulty walking, decreased strength, and postural dysfunction.   ACTIVITY LIMITATIONS: carrying, lifting, standing, squatting, stairs, transfers, and bed mobility  PERSONAL FACTORS: Age, Past/current  experiences, and Time since onset of injury/illness/exacerbation are also affecting patient's functional outcome.   REHAB POTENTIAL: Good  CLINICAL DECISION MAKING: Evolving/moderate complexity  EVALUATION COMPLEXITY: Low   GOALS: Goals reviewed with patient? No  SHORT TERM GOALS: Target date: 10/21/2022  Patient to demonstrate independence in HEP Baseline: [email or web address] Goal status: MET  2.  500 ft ambulation with SPC  Baseline: 224ft w/cane; 11/04/22 55ft w/cane Goal status: Met  LONG TERM GOALS: Target date: 12/08/22  Decrease 5x STS time to 20s arms crossed Baseline: 27s arms used; 11/19/22 13s arms crossed Goal status: Met  2.  Increase distance on 2 MWT to 364ft w/cane Baseline: 271ft w/cane; 11/19/22 253ft w/cane Goal status: Ongoing  3.  Increase BLE strength to 4/5 Baseline:  MMT Right eval Left eval  Hip flexion 3+ 4-  Hip extension 3+ 4-  Hip abduction 3+ 4-  Hip adduction    Hip internal rotation    Hip external rotation    Knee flexion 3+ 4-  Knee extension 3+ 4-  Ankle dorsiflexion    Ankle plantarflexion 3+ 4-   Goal status: INITIAL  4.  Increase FOTO score to 64 Baseline: 49; 11/14/22 45 Goal status: Ongoing   PLAN:  PT FREQUENCY: 1x/week  PT DURATION: 4 weeks (beginning 12/03/22)  PLANNED INTERVENTIONS: Therapeutic exercises, Therapeutic activity, Neuromuscular re-education, Balance training, Gait training, Patient/Family education, Self Care, Joint mobilization, Aquatic Therapy, Dry Needling, Electrical stimulation, Cryotherapy, Moist heat, Manual therapy, and Re-evaluation.  PLAN FOR NEXT SESSION: HEP review and update, manual techniques as appropriate, aerobic tasks, ROM and flexibility activities, strengthening and PREs, TPDN, gait and balance training as needed     Hildred Laser, PT 11/19/2022, 3:57 PM

## 2022-11-19 ENCOUNTER — Ambulatory Visit: Payer: Medicare HMO

## 2022-11-19 ENCOUNTER — Encounter: Payer: Self-pay | Admitting: Family Medicine

## 2022-11-19 ENCOUNTER — Telehealth: Payer: Self-pay | Admitting: Pharmacist

## 2022-11-19 ENCOUNTER — Ambulatory Visit (INDEPENDENT_AMBULATORY_CARE_PROVIDER_SITE_OTHER): Payer: Medicare HMO | Admitting: Family Medicine

## 2022-11-19 VITALS — BP 110/68 | HR 62 | Resp 18 | Ht 67.0 in | Wt 157.0 lb

## 2022-11-19 DIAGNOSIS — M6281 Muscle weakness (generalized): Secondary | ICD-10-CM

## 2022-11-19 DIAGNOSIS — R5381 Other malaise: Secondary | ICD-10-CM

## 2022-11-19 DIAGNOSIS — E785 Hyperlipidemia, unspecified: Secondary | ICD-10-CM | POA: Diagnosis not present

## 2022-11-19 DIAGNOSIS — R2689 Other abnormalities of gait and mobility: Secondary | ICD-10-CM

## 2022-11-19 DIAGNOSIS — E119 Type 2 diabetes mellitus without complications: Secondary | ICD-10-CM | POA: Diagnosis not present

## 2022-11-19 DIAGNOSIS — E1169 Type 2 diabetes mellitus with other specified complication: Secondary | ICD-10-CM

## 2022-11-19 LAB — POCT UA - MICROALBUMIN
Albumin/Creatinine Ratio, Urine, POC: <30
Creatinine, POC: 50 mg/dL
Microalbumin Ur, POC: 10 mg/L

## 2022-11-19 LAB — POCT GLYCOSYLATED HEMOGLOBIN (HGB A1C): HbA1c POC (<> result, manual entry): 5.5 % (ref 4.0–5.6)

## 2022-11-19 NOTE — Telephone Encounter (Signed)
Received notification from [employer] that patient was approved for Repatha patient assistance until 04/07/23.

## 2022-11-19 NOTE — Assessment & Plan Note (Signed)
Taking Repatha.  Will follow-up with his cardiologist for repeat lipid testing after his next dose.

## 2022-11-19 NOTE — Patient Instructions (Signed)
It was nice to see you today,  We addressed the following topics today: - your A1c was 5.5%.  continue your current medications.   - make sure you get enough protein and continue to exercise to strengthen your legs.  - follow up in 3 months  Have a great day,  Frederic Jericho, MD

## 2022-11-19 NOTE — Assessment & Plan Note (Signed)
Compliant with Jardiance.  Not on any other antidiabetic medications.  Well-controlled.  Getting urine albumin creatinine ratio today.  Follow-up in 3 months.

## 2022-11-19 NOTE — Addendum Note (Signed)
Addended by: Hildred Laser on: 11/19/2022 05:14 PM   Modules accepted: Orders

## 2022-11-19 NOTE — Progress Notes (Signed)
   Established Patient Office Visit  Subjective   Patient ID: Jacob Harrison, male    DOB: 1940/04/26  Age: 82 y.o. MRN: [id number]  Chief Complaint  Patient presents with   Diabetes   Hyperlipidemia   Hypertension    HPI HLD-patient has taken his Repatha 3 times so far.  After the fourth time he is going to have it rechecked by his cardiologist.  DM2-patient is compliant with his Jardiance.  No side effects.  No concerns.  This is his only diabetic medication.  States that he checks his blood sugars at home and they are generally between 95 and 105.  Not having any symptoms of low blood sugar.  Myositis-patient going to PT outpatient.  Still feels like his right leg is more weak than the left.  Has most weakness with hip flexion.  Gets plenty of protein in his diet from lean meats and protein powder.  Patient had second cataract surgery on his eye 3 weeks ago.   The ASCVD Risk score (Arnett DK, et al., 2019) failed to calculate for the following reasons:   The patient has a prior MI or stroke diagnosis  Health Maintenance Due  Topic Date Due   Zoster Vaccines- Shingrix (1 of 2) Never done   COVID-19 Vaccine (3 - Pfizer risk series) 06/29/2019   Diabetic kidney evaluation - Urine ACR  10/24/2022   INFLUENZA VACCINE  11/06/2022      Objective:     BP 110/68 (BP Location: Left Arm, Patient Position: Sitting, Cuff Size: Normal)   Pulse 62   Resp 18   Ht 5\' 7"  (1.702 m)   Wt 157 lb (71.2 kg)   SpO2 98%   BMI 24.59 kg/m    Physical Exam General: Alert, oriented CV: Regular rate rhythm Pulmonary: Clear bilaterally MSK: Decreased right-sided hip flexion.  No tenderness to palpation of the thigh.   Results for orders placed or performed in visit on 11/19/22  POCT HgB A1C  Result Value Ref Range   Hemoglobin A1C     HbA1c POC (<> result, manual entry) 5.5 4.0 - 5.6 %   HbA1c, POC (prediabetic range)     HbA1c, POC (controlled diabetic range)           Assessment & Plan:   Type 2 diabetes mellitus without complication, without long-term current use of insulin (HCC) Assessment & Plan: Compliant with Jardiance.  Not on any other antidiabetic medications.  Well-controlled.  Getting urine albumin creatinine ratio today.  Follow-up in 3 months.  Orders: -     POCT glycosylated hemoglobin (Hb A1C) -     POCT UA - Microalbumin  Muscle weakness Assessment & Plan: Right sided weakness with hip flexion.  Likely secondary to his recent statin induced myositis.  Currently in PT.  Encouraged patient to continue PT and exercises and encouraged adequate protein intake.   Hyperlipidemia associated with type 2 diabetes mellitus (HCC) Assessment & Plan: Taking Repatha.  Will follow-up with his cardiologist for repeat lipid testing after his next dose.      Return in about 3 months (around 02/19/2023) for DM.    Sandre Kitty, MD

## 2022-11-19 NOTE — Assessment & Plan Note (Signed)
Right sided weakness with hip flexion.  Likely secondary to his recent statin induced myositis.  Currently in PT.  Encouraged patient to continue PT and exercises and encouraged adequate protein intake.

## 2022-11-21 ENCOUNTER — Encounter: Payer: Self-pay | Admitting: Internal Medicine

## 2022-11-21 DIAGNOSIS — R310 Gross hematuria: Secondary | ICD-10-CM | POA: Diagnosis not present

## 2022-11-21 DIAGNOSIS — R3915 Urgency of urination: Secondary | ICD-10-CM | POA: Diagnosis not present

## 2022-11-21 DIAGNOSIS — N401 Enlarged prostate with lower urinary tract symptoms: Secondary | ICD-10-CM | POA: Diagnosis not present

## 2022-11-26 ENCOUNTER — Ambulatory Visit: Payer: Medicare HMO

## 2022-11-26 DIAGNOSIS — R2689 Other abnormalities of gait and mobility: Secondary | ICD-10-CM

## 2022-11-26 DIAGNOSIS — M6281 Muscle weakness (generalized): Secondary | ICD-10-CM

## 2022-11-26 DIAGNOSIS — R5381 Other malaise: Secondary | ICD-10-CM | POA: Diagnosis not present

## 2022-11-26 NOTE — Therapy (Signed)
OUTPATIENT PHYSICAL THERAPY TREATMENT NOTE    Patient Name: Jacob Harrison MRN: [id number] DOB:03/10/1941, 82 y.o., male Today's Date: 11/26/2022  END OF SESSION:  PT End of Session - 11/26/22 1352     Visit Number 8    Number of Visits 12    Date for PT Re-Evaluation 01/19/23    Authorization Type [employer] MCR    Progress Note Due on Visit 10    PT Start Time 1400    PT Stop Time 1442    PT Time Calculation (min) 42 min    Equipment Utilized During Treatment Gait belt    Activity Tolerance Patient tolerated treatment well    Behavior During Therapy WFL for tasks assessed/performed                Past Medical History:  Diagnosis Date   (HFpEF) heart failure with preserved ejection fraction (HCC)    a. 10/2016 Echo: EF 65-70%, mild LVH, mildly dil LA. RV fxn nl.   Arthritis    BPH (benign prostatic hyperplasia)    Coronary artery disease    a. 2001 CABG x 3 [facility]): LIMA->LAD, VG->LCX, VG->RPDA; b. 04/05/2010 Cath ([place]): LM 63m, LAD 117m, LCX 45m, RCA 100p, LIMA->LAD atretic, VG->LCX 100, VG->RCA 80ost, 70d; c. 04/2010 Redo CABG x 2 ([place]): VG->LAD, VG->RPDA; d. 10/2012 Cath [facility]): LM 20-30, LAD 100/95-55m, LCX 40-22m, RCA 100p, 80-100d, RPDA 99, RPL1 95, VG->LAD ok, VG->RPDA ok, EF 67%; e. 11/2016 MV: EF 48%, No ischemia.   Diabetes mellitus without complication (HCC)    Type II   Hyperlipidemia    Hypertension    Ischemic cardiomyopathy    a. 03/2021 s/p MDT Visia AF MRI VR SureScan single lead AICD (ser# [id number]).   Scoliosis    Spinal stenosis    Past Surgical History:  Procedure Laterality Date   CARDIAC CATHETERIZATION     CORONARY ARTERY BYPASS GRAFT     x 2 (2001 and redo in late 2011 or early 2012)   HERNIA REPAIR Left    inguinal   ICD IMPLANT N/A 03/20/2021   Procedure: ICD IMPLANT;  Surgeon: [name], MD;  Location: [facility] INVASIVE CV LAB;  Service: Cardiovascular;  Laterality: N/A;   LEFT HEART CATH AND CORS/GRAFTS  ANGIOGRAPHY N/A 10/04/2020   Procedure: LEFT HEART CATH AND CORS/GRAFTS ANGIOGRAPHY;  Surgeon: [name], MD;  Location: [facility] INVASIVE CV LAB;  Service: Cardiovascular;  Laterality: N/A;   LUMBAR LAMINECTOMY/DECOMPRESSION MICRODISCECTOMY N/A 10/20/2018   Procedure: L3-4, L4-5 LUMBAR DECOMPRESSION with dural repair.;  Surgeon: [name], MD;  Location: [facility] OR;  Service: Orthopedics;  Laterality: N/A;   RIGHT/LEFT HEART CATH AND CORONARY/GRAFT ANGIOGRAPHY N/A 05/27/2022   Procedure: RIGHT/LEFT HEART CATH AND CORONARY/GRAFT ANGIOGRAPHY;  Surgeon: [name], MD;  Location: [facility] INVASIVE CV LAB;  Service: Cardiovascular;  Laterality: N/A;   TONSILLECTOMY AND ADENOIDECTOMY     TRANSURETHRAL RESECTION OF PROSTATE  1990   Patient Active Problem List   Diagnosis Date Noted   Muscle weakness 11/19/2022   Statins contraindicated 10/16/2022   Paroxysmal atrial fibrillation (HCC) 08/27/2022   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) 08/27/2022   ICD (implantable cardioverter-defibrillator) in place 08/05/2022   Rhabdomyolysis 07/05/2022   Transaminitis 07/05/2022   Leukocytosis 07/05/2022   Chronic low back pain 07/02/2022   Dyspnea on exertion 05/22/2022   Chronic HFrEF (heart failure with reduced ejection fraction) (HCC) 12/13/2021   Acute on chronic heart failure with preserved ejection fraction (HFpEF) (HCC) 10/08/2020  Non-ST elevation (NSTEMI) myocardial infarction (HCC) 10/08/2020   CHF (congestive heart failure) (HCC) 10/04/2020   Hx of CABG    Accelerating angina (HCC) 10/03/2020   Rotator cuff disorder 08/09/2019   BPH associated with nocturia 11/29/2018   Status post lumbar spine surgery for decompression of spinal cord 10/29/2018   Spinal stenosis of lumbar region 08/02/2018   Atypical mole 07/30/2017   Wheezing 07/21/2017   Sinus bradycardia 03/18/2017   Ventral hernia without obstruction or gangrene 02/25/2017   (HFpEF) heart failure with  preserved ejection fraction (HCC) 09/28/2016   Ischemic cardiomyopathy 09/28/2016   Vitamin D deficiency 06/27/2016   Vitamin B deficiency 06/27/2016   Type 2 diabetes mellitus without complication, without long-term current use of insulin (HCC) 05/20/2016   Essential hypertension 05/20/2016   Hyperlipidemia associated with type 2 diabetes mellitus (HCC) 05/20/2016   Coronary artery disease of native artery of native heart with stable angina pectoris (HCC) 05/20/2016    PCP: [name], PA   REFERRING PROVIDER: [name], PA   REFERRING DIAG: R53.1 (ICD-10-CM) - Weakness M25.60 (ICD-10-CM) - Decreased range of motion  Rationale for Evaluation and Treatment: Rehabilitation  THERAPY DIAG:  Other abnormalities of gait and mobility  Muscle weakness (generalized)  ONSET DATE: 07/10/22  SUBJECTIVE:                                                                                                                                                                                           SUBJECTIVE STATEMENT:  Pt presents to PT with no current pain. Continues to denote weakness in LE.   PERTINENT HISTORY:  Patient had statin induced rhabdomyolysis, myalgia has resolved but he continues to experience weakness, tightness, limited ROM specifically in legs and lower back, shoulders and neck.  PAIN:  Are you having pain? No  PRECAUTIONS: Fall  WEIGHT BEARING RESTRICTIONS: No  FALLS:  Has patient fallen in last 6 months? Yes. Number of falls 1  OCCUPATION: retired  PLOF: Independent  PATIENT GOALS: To regain my LE strength and mobility  NEXT MD VISIT: PRN  OBJECTIVE:   DIAGNOSTIC FINDINGS:  none  PATIENT SURVEYS:  FOTO 49(64 predicted) 11/11/22: 45%  SCREENING FOR RED FLAGS: Bowel or bladder incontinence: No  MUSCLE LENGTH: N/T  POSTURE: rounded shoulders, forward head, and decreased lumbar lordosis   LUMBAR ROM: deferred  AROM eval  Flexion   Extension    Right lateral flexion   Left lateral flexion   Right rotation   Left rotation    (Blank rows = not tested)  LOWER EXTREMITY ROM:   WFL for gait, bed mobility and transfers  Active  Right eval Left eval  Hip flexion    Hip extension    Hip abduction    Hip adduction    Hip internal rotation    Hip external rotation    Knee flexion    Knee extension    Ankle dorsiflexion    Ankle plantarflexion    Ankle inversion    Ankle eversion     (Blank rows = not tested)  LOWER EXTREMITY MMT:    MMT Right eval Left eval  Hip flexion 3+ 4-  Hip extension 3+ 4-  Hip abduction 3+ 4-  Hip adduction    Hip internal rotation    Hip external rotation    Knee flexion 3+ 4-  Knee extension 3+ 4-  Ankle dorsiflexion    Ankle plantarflexion 3+ 4-  Ankle inversion    Ankle eversion     (Blank rows = not tested)  LUMBAR SPECIAL TESTS:  Slump test: Negative  FUNCTIONAL TESTS:  5 times sit to stand: 27s with UE Support 2 MWT with cane 224ft  GAIT: Distance walked: 44ftx2 Assistive device utilized: Single point cane Level of assistance: Complete Independence Comments: slow cadence  TODAY'S TREATMENT:     [facility] Adult PT Treatment:                                                DATE: 11/19/22 Therapeutic Exercise: Nustep L5 x 5 min while taking subjective HR 92, O2 sats 96% Supine SLR 3x10 each Supine clamshell 2x15 blue band Bridge with blue band 2x10 Supine ball squeeze 2x10 - 5" hold S/L hip abd 2x10 each LAQ 2x10 2# STS 2x10 - no UE high table Standing hip abd/ext 2x10 each  [facility] Adult PT Treatment:                                                DATE: 11/11/22 Therapeutic Exercise: Nustep level 5 x 5 mins Runners step 6 in 10/10 B heel raises from DF position 2x10 Neuromuscular re-ed: Hurdle step overs at counter lateral - 4 hurdles x2 laps Step ups 4" with airex on top x10 fwd BIL Mini squats on airex x10 Obstacle course: 3 hurdles, 2" step with airex step up and  over, figure 8 weaving around foam rollers - with cane and gait belt   [facility] Adult PT Treatment:                                                DATE: 11/07/22 Therapeutic Exercise: Bridge with ball 15x Curl ups with ball for proprioception 15x Supine hip fallouts Blu TB 15x B, 15/15 unilaterally Bridge against BluTB 15x R clams BluTB 15x Runners step 6 in 10/10 B heel raises from DF position 10x Therapeutic Activity:   11/07/22 0001  Dynamic Gait Index  Level Surface 2  Change in Gait Speed 3  Gait with Horizontal Head Turns 3  Gait with Vertical Head Turns 2  Gait and Pivot Turn 2  Step Over Obstacle 3  Step Around Obstacles 3  Steps 2  Total Score 20  [facility] Adult PT Treatment:                                                DATE: 11/04/22 Therapeutic Exercise: Ambulation of 587ft with with SPC  R SLR 15x Supine hip fallouts Blu TB 15x B, 15/15 unilaterally S/L R clams BluTB 15x SAQs R 2# 15x Supine march 15/15 2# WS onto 6 in step A/P 10/10, lateral 10x B no UE support  [facility] Adult PT Treatment:                                                DATE: 10/28/22 Therapeutic Exercise: Nustep level 5 x 8 mins Sidestepping at counter x 4 laps Heel raise at counter 2x10 Standing marching at counter 3x30" Seated pball push down TA activation 3" hold 2x10 STS x10 no UE Therapeutic Activity: Hurdle step overs at counter fwd/lat - 4 hurdles Step ups 4" with airex on top x10 fwd BIL Step ups lateral onto airex x10 BIL Mini squats on airex  PATIENT EDUCATION:  Education details: Discussed eval findings, rehab rationale and POC and patient is in agreement  Person educated: Patient Education method: Explanation Education comprehension: verbalized understanding and needs further education  HOME EXERCISE PROGRAM: Access Code: [email or web address] URL: [email or web address] Date: 10/17/2022 Prepared by: Gustavus Bryant  Exercises - Sit to Stand with Armchair  - 2 x daily - 5 x  weekly - 1 sets - 5 reps - Standing Heel Raise with Support  - 2 x daily - 5 x weekly - 2 sets - 10 reps - Supine Bridge  - 2 x daily - 5 x weekly - 2 sets - 10 reps - Curl Up with Reach  - 2 x daily - 5 x weekly - 2 sets - 10 reps - Supine March with Posterior Pelvic Tilt  - 2 x daily - 5 x weekly - 2 sets - 10 reps - 30s hold - Sidelying Open Book Thoracic Lumbar Rotation and Extension  - 2 x daily - 5 x weekly - 1 sets - 10 reps  ASSESSMENT:  CLINICAL IMPRESSION:  Pt was able to complete all prescribed exercises with no adverse effect. Did note fatigue in R LE throughout session, with therapy focused on improving general LE strength. He continues to benefit from skilled PT services, will continue to progress as able per POC.   OBJECTIVE IMPAIRMENTS: Abnormal gait, decreased activity tolerance, decreased balance, decreased coordination, decreased endurance, decreased mobility, difficulty walking, decreased strength, and postural dysfunction.   ACTIVITY LIMITATIONS: carrying, lifting, standing, squatting, stairs, transfers, and bed mobility  PERSONAL FACTORS: Age, Past/current experiences, and Time since onset of injury/illness/exacerbation are also affecting patient's functional outcome.   REHAB POTENTIAL: Good  CLINICAL DECISION MAKING: Evolving/moderate complexity  EVALUATION COMPLEXITY: Low   GOALS: Goals reviewed with patient? No  SHORT TERM GOALS: Target date: 10/21/2022  Patient to demonstrate independence in HEP Baseline: [id number] Goal status: MET  2.  500 ft ambulation with SPC  Baseline: 237ft w/cane; 11/04/22 576ft w/cane Goal status: Met  LONG TERM GOALS: Target date: 12/08/22  Decrease 5x STS time to 20s arms crossed Baseline: 27s arms used; 11/19/22 13s arms crossed Goal status: Met  2.  Increase distance  on 2 MWT to 375ft w/cane Baseline: 241ft w/cane; 11/19/22 277ft w/cane Goal status: Ongoing  3.  Increase BLE strength to 4/5 Baseline:  MMT Right eval  Left eval  Hip flexion 3+ 4-  Hip extension 3+ 4-  Hip abduction 3+ 4-  Hip adduction    Hip internal rotation    Hip external rotation    Knee flexion 3+ 4-  Knee extension 3+ 4-  Ankle dorsiflexion    Ankle plantarflexion 3+ 4-   Goal status: INITIAL  4.  Increase FOTO score to 64 Baseline: 49; 11/14/22 45 Goal status: Ongoing   PLAN:  PT FREQUENCY: 1x/week  PT DURATION: 4 weeks (beginning 12/03/22)  PLANNED INTERVENTIONS: Therapeutic exercises, Therapeutic activity, Neuromuscular re-education, Balance training, Gait training, Patient/Family education, Self Care, Joint mobilization, Aquatic Therapy, Dry Needling, Electrical stimulation, Cryotherapy, Moist heat, Manual therapy, and Re-evaluation.  PLAN FOR NEXT SESSION: HEP review and update, manual techniques as appropriate, aerobic tasks, ROM and flexibility activities, strengthening and PREs, TPDN, gait and balance training as needed     Eloy End, PT 11/26/2022, 4:14 PM

## 2022-12-02 DIAGNOSIS — Z961 Presence of intraocular lens: Secondary | ICD-10-CM | POA: Diagnosis not present

## 2022-12-04 ENCOUNTER — Encounter: Payer: Self-pay | Admitting: Family Medicine

## 2022-12-04 NOTE — Therapy (Signed)
OUTPATIENT PHYSICAL THERAPY TREATMENT NOTE    Patient Name: Jacob Harrison MRN: [id number] DOB:[birth year], 82 y.o., male Today's Date: 12/05/2022  END OF SESSION:  PT End of Session - 12/05/22 0834     Visit Number 9    Number of Visits 12    Date for PT Re-Evaluation 01/19/23    Authorization Type [employer] MCR    Progress Note Due on Visit 10    PT Start Time 0830    PT Stop Time 0910    PT Time Calculation (min) 40 min    Equipment Utilized During Treatment Gait belt    Activity Tolerance Patient tolerated treatment well    Behavior During Therapy WFL for tasks assessed/performed                 Past Medical History:  Diagnosis Date   (HFpEF) heart failure with preserved ejection fraction (HCC)    a. 10/2016 Echo: EF 65-70%, mild LVH, mildly dil LA. RV fxn nl.   Arthritis    BPH (benign prostatic hyperplasia)    Coronary artery disease    a. 2001 CABG x 3 [facility]): LIMA->LAD, VG->LCX, VG->RPDA; b. 04/05/2010 Cath ([place]): LM 65m, LAD 142m, LCX 36m, RCA 100p, LIMA->LAD atretic, VG->LCX 100, VG->RCA 80ost, 70d; c. 04/2010 Redo CABG x 2 ([place]): VG->LAD, VG->RPDA; d. 10/2012 Cath [facility]): LM 20-30, LAD 100/95-46m, LCX 40-38m, RCA 100p, 80-100d, RPDA 99, RPL1 95, VG->LAD ok, VG->RPDA ok, EF 67%; e. 11/2016 MV: EF 48%, No ischemia.   Diabetes mellitus without complication (HCC)    Type II   Hyperlipidemia    Hypertension    Ischemic cardiomyopathy    a. 03/2021 s/p MDT Visia AF MRI VR SureScan single lead AICD (ser# [id number]).   Scoliosis    Spinal stenosis    Past Surgical History:  Procedure Laterality Date   CARDIAC CATHETERIZATION     CORONARY ARTERY BYPASS GRAFT     x 2 (2001 and redo in late 2011 or early 2012)   HERNIA REPAIR Left    inguinal   ICD IMPLANT N/A 03/20/2021   Procedure: ICD IMPLANT;  Surgeon: [name], MD;  Location: [facility] INVASIVE CV LAB;  Service: Cardiovascular;  Laterality: N/A;   LEFT HEART CATH AND CORS/GRAFTS  ANGIOGRAPHY N/A 10/04/2020   Procedure: LEFT HEART CATH AND CORS/GRAFTS ANGIOGRAPHY;  Surgeon: [name], MD;  Location: [facility] INVASIVE CV LAB;  Service: Cardiovascular;  Laterality: N/A;   LUMBAR LAMINECTOMY/DECOMPRESSION MICRODISCECTOMY N/A 10/20/2018   Procedure: L3-4, L4-5 LUMBAR DECOMPRESSION with dural repair.;  Surgeon: [name], MD;  Location: [facility] OR;  Service: Orthopedics;  Laterality: N/A;   RIGHT/LEFT HEART CATH AND CORONARY/GRAFT ANGIOGRAPHY N/A 05/27/2022   Procedure: RIGHT/LEFT HEART CATH AND CORONARY/GRAFT ANGIOGRAPHY;  Surgeon: [name], MD;  Location: [facility] INVASIVE CV LAB;  Service: Cardiovascular;  Laterality: N/A;   TONSILLECTOMY AND ADENOIDECTOMY     TRANSURETHRAL RESECTION OF PROSTATE  1990   Patient Active Problem List   Diagnosis Date Noted   Muscle weakness 11/19/2022   Statins contraindicated 10/16/2022   Paroxysmal atrial fibrillation (HCC) 08/27/2022   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) 08/27/2022   ICD (implantable cardioverter-defibrillator) in place 08/05/2022   Rhabdomyolysis 07/05/2022   Transaminitis 07/05/2022   Leukocytosis 07/05/2022   Chronic low back pain 07/02/2022   Dyspnea on exertion 05/22/2022   Chronic HFrEF (heart failure with reduced ejection fraction) (HCC) 12/13/2021   Acute on chronic heart failure with preserved ejection fraction (HFpEF) (HCC)  10/08/2020   Non-ST elevation (NSTEMI) myocardial infarction (HCC) 10/08/2020   CHF (congestive heart failure) (HCC) 10/04/2020   Hx of CABG    Accelerating angina (HCC) 10/03/2020   Rotator cuff disorder 08/09/2019   BPH associated with nocturia 11/29/2018   Status post lumbar spine surgery for decompression of spinal cord 10/29/2018   Spinal stenosis of lumbar region 08/02/2018   Atypical mole 07/30/2017   Wheezing 07/21/2017   Sinus bradycardia 03/18/2017   Ventral hernia without obstruction or gangrene 02/25/2017   (HFpEF) heart failure with  preserved ejection fraction (HCC) 09/28/2016   Ischemic cardiomyopathy 09/28/2016   Vitamin D deficiency 06/27/2016   Vitamin B deficiency 06/27/2016   Type 2 diabetes mellitus without complication, without long-term current use of insulin (HCC) 05/20/2016   Essential hypertension 05/20/2016   Hyperlipidemia associated with type 2 diabetes mellitus (HCC) 05/20/2016   Coronary artery disease of native artery of native heart with stable angina pectoris (HCC) 05/20/2016    PCP: [name], PA   REFERRING PROVIDER: [name], PA   REFERRING DIAG: R53.1 (ICD-10-CM) - Weakness M25.60 (ICD-10-CM) - Decreased range of motion  Rationale for Evaluation and Treatment: Rehabilitation  THERAPY DIAG:  Other abnormalities of gait and mobility  Muscle weakness (generalized)  Physical deconditioning  ONSET DATE: 07/10/22  SUBJECTIVE:                                                                                                                                                                                           SUBJECTIVE STATEMENT:  Pt presents to PT with no current pain. Continues to denote weakness in LE.   PERTINENT HISTORY:  Patient had statin induced rhabdomyolysis, myalgia has resolved but he continues to experience weakness, tightness, limited ROM specifically in legs and lower back, shoulders and neck.  PAIN:  Are you having pain? No  PRECAUTIONS: Fall  WEIGHT BEARING RESTRICTIONS: No  FALLS:  Has patient fallen in last 6 months? Yes. Number of falls 1  OCCUPATION: retired  PLOF: Independent  PATIENT GOALS: To regain my LE strength and mobility  NEXT MD VISIT: PRN  OBJECTIVE:   DIAGNOSTIC FINDINGS:  none  PATIENT SURVEYS:  FOTO 49(64 predicted) 11/11/22: 45%  SCREENING FOR RED FLAGS: Bowel or bladder incontinence: No  MUSCLE LENGTH: N/T  POSTURE: rounded shoulders, forward head, and decreased lumbar lordosis   LUMBAR ROM: deferred  AROM  eval  Flexion   Extension   Right lateral flexion   Left lateral flexion   Right rotation   Left rotation    (Blank rows = not tested)  LOWER EXTREMITY ROM:   [facility]  for gait, bed mobility and transfers  Active  Right eval Left eval  Hip flexion    Hip extension    Hip abduction    Hip adduction    Hip internal rotation    Hip external rotation    Knee flexion    Knee extension    Ankle dorsiflexion    Ankle plantarflexion    Ankle inversion    Ankle eversion     (Blank rows = not tested)  LOWER EXTREMITY MMT:    MMT Right eval Left eval  Hip flexion 3+ 4-  Hip extension 3+ 4-  Hip abduction 3+ 4-  Hip adduction    Hip internal rotation    Hip external rotation    Knee flexion 3+ 4-  Knee extension 3+ 4-  Ankle dorsiflexion    Ankle plantarflexion 3+ 4-  Ankle inversion    Ankle eversion     (Blank rows = not tested)  LUMBAR SPECIAL TESTS:  Slump test: Negative  FUNCTIONAL TESTS:  5 times sit to stand: 27s with UE Support 2 MWT with cane 242ft  GAIT: Distance walked: 73ftx2 Assistive device utilized: Single point cane Level of assistance: Complete Independence Comments: slow cadence  TODAY'S TREATMENT:     [facility] Adult PT Treatment:                                                DATE: 12/05/22 Therapeutic Exercise: Nustep L5 x 8 min R hip flexor 30s x2 Supine QL stretch 30s x2 B  Supine curl up with p-ball 10x B, 10/10 unilaterally Supine march, 3# 10/10 with OP to promote SKTC Supine clamshell BluTB 15x B, 15/15 unilateral S/L clams BluTB 15/15   [facility] Adult PT Treatment:                                                DATE: 11/19/22 Therapeutic Exercise: Nustep L5 x 5 min while taking subjective HR 92, O2 sats 96% Supine SLR 3x10 each Supine clamshell 2x15 blue band Bridge with blue band 2x10 Supine ball squeeze 2x10 - 5" hold S/L hip abd 2x10 each LAQ 2x10 2# STS 2x10 - no UE high table Standing hip abd/ext 2x10 each  [facility] Adult PT  Treatment:                                                DATE: 11/11/22 Therapeutic Exercise: Nustep level 5 x 5 mins Runners step 6 in 10/10 B heel raises from DF position 2x10 Neuromuscular re-ed: Hurdle step overs at counter lateral - 4 hurdles x2 laps Step ups 4" with airex on top x10 fwd BIL Mini squats on airex x10 Obstacle course: 3 hurdles, 2" step with airex step up and over, figure 8 weaving around foam rollers - with cane and gait belt   [facility] Adult PT Treatment:  DATE: 11/07/22 Therapeutic Exercise: Bridge with ball 15x Curl ups with ball for proprioception 15x Supine hip fallouts Blu TB 15x B, 15/15 unilaterally Bridge against BluTB 15x R clams BluTB 15x Runners step 6 in 10/10 B heel raises from DF position 10x Therapeutic Activity:   11/07/22 0001  Dynamic Gait Index  Level Surface 2  Change in Gait Speed 3  Gait with Horizontal Head Turns 3  Gait with Vertical Head Turns 2  Gait and Pivot Turn 2  Step Over Obstacle 3  Step Around Obstacles 3  Steps 2  Total Score 20    [facility] Adult PT Treatment:                                                DATE: 11/04/22 Therapeutic Exercise: Ambulation of 570ft with with SPC  R SLR 15x Supine hip fallouts Blu TB 15x B, 15/15 unilaterally S/L R clams BluTB 15x SAQs R 2# 15x Supine march 15/15 2# WS onto 6 in step A/P 10/10, lateral 10x B no UE support  [facility] Adult PT Treatment:                                                DATE: 10/28/22 Therapeutic Exercise: Nustep level 5 x 8 mins Sidestepping at counter x 4 laps Heel raise at counter 2x10 Standing marching at counter 3x30" Seated pball push down TA activation 3" hold 2x10 STS x10 no UE Therapeutic Activity: Hurdle step overs at counter fwd/lat - 4 hurdles Step ups 4" with airex on top x10 fwd BIL Step ups lateral onto airex x10 BIL Mini squats on airex  PATIENT EDUCATION:  Education details: Discussed eval  findings, rehab rationale and POC and patient is in agreement  Person educated: Patient Education method: Explanation Education comprehension: verbalized understanding and needs further education  HOME EXERCISE PROGRAM: Access Code: [email or web address] URL: [email or web address] Date: 10/17/2022 Prepared by: Gustavus Bryant  Exercises - Sit to Stand with Armchair  - 2 x daily - 5 x weekly - 1 sets - 5 reps - Standing Heel Raise with Support  - 2 x daily - 5 x weekly - 2 sets - 10 reps - Supine Bridge  - 2 x daily - 5 x weekly - 2 sets - 10 reps - Curl Up with Reach  - 2 x daily - 5 x weekly - 2 sets - 10 reps - Supine March with Posterior Pelvic Tilt  - 2 x daily - 5 x weekly - 2 sets - 10 reps - 30s hold - Sidelying Open Book Thoracic Lumbar Rotation and Extension  - 2 x daily - 5 x weekly - 1 sets - 10 reps  ASSESSMENT:  CLINICAL IMPRESSION: Added additional core tasks for spinal mobility as well as core strength.  Continued hip strengthening and reviewed HEP QL stretch.  Added R hip flexor stretch and OP to marching tasks with weighted LES.  Instructed to focus on heelstrike during gait.  OBJECTIVE IMPAIRMENTS: Abnormal gait, decreased activity tolerance, decreased balance, decreased coordination, decreased endurance, decreased mobility, difficulty walking, decreased strength, and postural dysfunction.   ACTIVITY LIMITATIONS: carrying, lifting, standing, squatting, stairs, transfers, and bed mobility  PERSONAL FACTORS: Age, Past/current experiences, and Time since  onset of injury/illness/exacerbation are also affecting patient's functional outcome.   REHAB POTENTIAL: Good  CLINICAL DECISION MAKING: Evolving/moderate complexity  EVALUATION COMPLEXITY: Low   GOALS: Goals reviewed with patient? No  SHORT TERM GOALS: Target date: 10/21/2022  Patient to demonstrate independence in HEP Baseline: [email or web address] Goal status: MET  2.  500 ft ambulation with SPC  Baseline: 264ft  w/cane; 11/04/22 521ft w/cane Goal status: Met  LONG TERM GOALS: Target date: 12/08/22  Decrease 5x STS time to 20s arms crossed Baseline: 27s arms used; 11/19/22 13s arms crossed Goal status: Met  2.  Increase distance on 2 MWT to 386ft w/cane Baseline: 280ft w/cane; 11/19/22 259ft w/cane Goal status: Ongoing  3.  Increase BLE strength to 4/5 Baseline:  MMT Right eval Left eval  Hip flexion 3+ 4-  Hip extension 3+ 4-  Hip abduction 3+ 4-  Hip adduction    Hip internal rotation    Hip external rotation    Knee flexion 3+ 4-  Knee extension 3+ 4-  Ankle dorsiflexion    Ankle plantarflexion 3+ 4-   Goal status: INITIAL  4.  Increase FOTO score to 64 Baseline: 49; 11/14/22 45 Goal status: Ongoing   PLAN:  PT FREQUENCY: 1x/week  PT DURATION: 4 weeks (beginning 12/03/22)  PLANNED INTERVENTIONS: Therapeutic exercises, Therapeutic activity, Neuromuscular re-education, Balance training, Gait training, Patient/Family education, Self Care, Joint mobilization, Aquatic Therapy, Dry Needling, Electrical stimulation, Cryotherapy, Moist heat, Manual therapy, and Re-evaluation.  PLAN FOR NEXT SESSION: HEP review and update, manual techniques as appropriate, aerobic tasks, ROM and flexibility activities, strengthening and PREs, TPDN, gait and balance training as needed     Hildred Laser, PT 12/05/2022, 9:15 AM

## 2022-12-05 ENCOUNTER — Ambulatory Visit: Payer: Medicare HMO

## 2022-12-05 DIAGNOSIS — R3129 Other microscopic hematuria: Secondary | ICD-10-CM | POA: Diagnosis not present

## 2022-12-05 DIAGNOSIS — K5730 Diverticulosis of large intestine without perforation or abscess without bleeding: Secondary | ICD-10-CM | POA: Diagnosis not present

## 2022-12-05 DIAGNOSIS — R5381 Other malaise: Secondary | ICD-10-CM

## 2022-12-05 DIAGNOSIS — M6281 Muscle weakness (generalized): Secondary | ICD-10-CM | POA: Diagnosis not present

## 2022-12-05 DIAGNOSIS — R2689 Other abnormalities of gait and mobility: Secondary | ICD-10-CM | POA: Diagnosis not present

## 2022-12-05 DIAGNOSIS — R319 Hematuria, unspecified: Secondary | ICD-10-CM | POA: Diagnosis not present

## 2022-12-05 DIAGNOSIS — K449 Diaphragmatic hernia without obstruction or gangrene: Secondary | ICD-10-CM | POA: Diagnosis not present

## 2022-12-05 DIAGNOSIS — N200 Calculus of kidney: Secondary | ICD-10-CM | POA: Diagnosis not present

## 2022-12-11 ENCOUNTER — Other Ambulatory Visit: Payer: Self-pay | Admitting: Medical

## 2022-12-15 NOTE — Therapy (Unsigned)
OUTPATIENT PHYSICAL THERAPY TREATMENT NOTE/10TH VISIT PROGRESS NOTE   Patient Name: Jacob Harrison MRN: [id number] DOB:1941-03-10, 82 y.o., male Today's Date: 12/17/2022 Physical Therapy Progress Note   END OF SESSION:  PT End of Session - 12/17/22 1319     Visit Number 10    Number of Visits 12    Date for PT Re-Evaluation 01/19/23    Authorization Type [employer] MCR    Progress Note Due on Visit 10    PT Start Time 1315    PT Stop Time 1400    PT Time Calculation (min) 45 min    Activity Tolerance Patient tolerated treatment well                  Past Medical History:  Diagnosis Date   (HFpEF) heart failure with preserved ejection fraction (HCC)    a. 10/2016 Echo: EF 65-70%, mild LVH, mildly dil LA. RV fxn nl.   Arthritis    BPH (benign prostatic hyperplasia)    Coronary artery disease    a. 2001 CABG x 3 [facility]): LIMA->LAD, VG->LCX, VG->RPDA; b. 04/05/2010 Cath ([place]): LM 74m, LAD 148m, LCX 52m, RCA 100p, LIMA->LAD atretic, VG->LCX 100, VG->RCA 80ost, 70d; c. 04/2010 Redo CABG x 2 ([place]): VG->LAD, VG->RPDA; d. 10/2012 Cath [facility]): LM 20-30, LAD 100/95-7m, LCX 40-82m, RCA 100p, 80-100d, RPDA 99, RPL1 95, VG->LAD ok, VG->RPDA ok, EF 67%; e. 11/2016 MV: EF 48%, No ischemia.   Diabetes mellitus without complication (HCC)    Type II   Hyperlipidemia    Hypertension    Ischemic cardiomyopathy    a. 03/2021 s/p MDT Visia AF MRI VR SureScan single lead AICD (ser# [id number]).   Scoliosis    Spinal stenosis    Past Surgical History:  Procedure Laterality Date   CARDIAC CATHETERIZATION     CORONARY ARTERY BYPASS GRAFT     x 2 (2001 and redo in late 2011 or early 2012)   HERNIA REPAIR Left    inguinal   ICD IMPLANT N/A 03/20/2021   Procedure: ICD IMPLANT;  Surgeon: [name], MD;  Location: [facility] INVASIVE CV LAB;  Service: Cardiovascular;  Laterality: N/A;   LEFT HEART CATH AND CORS/GRAFTS ANGIOGRAPHY N/A 10/04/2020   Procedure: LEFT HEART CATH  AND CORS/GRAFTS ANGIOGRAPHY;  Surgeon: [name], MD;  Location: [facility] INVASIVE CV LAB;  Service: Cardiovascular;  Laterality: N/A;   LUMBAR LAMINECTOMY/DECOMPRESSION MICRODISCECTOMY N/A 10/20/2018   Procedure: L3-4, L4-5 LUMBAR DECOMPRESSION with dural repair.;  Surgeon: [name], MD;  Location: [facility] OR;  Service: Orthopedics;  Laterality: N/A;   RIGHT/LEFT HEART CATH AND CORONARY/GRAFT ANGIOGRAPHY N/A 05/27/2022   Procedure: RIGHT/LEFT HEART CATH AND CORONARY/GRAFT ANGIOGRAPHY;  Surgeon: [name], MD;  Location: [facility] INVASIVE CV LAB;  Service: Cardiovascular;  Laterality: N/A;   TONSILLECTOMY AND ADENOIDECTOMY     TRANSURETHRAL RESECTION OF PROSTATE  1990   Patient Active Problem List   Diagnosis Date Noted   Muscle weakness 11/19/2022   Statins contraindicated 10/16/2022   Paroxysmal atrial fibrillation (HCC) 08/27/2022   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) 08/27/2022   ICD (implantable cardioverter-defibrillator) in place 08/05/2022   Rhabdomyolysis 07/05/2022   Transaminitis 07/05/2022   Leukocytosis 07/05/2022   Chronic low back pain 07/02/2022   Dyspnea on exertion 05/22/2022   Chronic HFrEF (heart failure with reduced ejection fraction) (HCC) 12/13/2021   Acute on chronic heart failure with preserved ejection fraction (HFpEF) (HCC) 10/08/2020   Non-ST elevation (NSTEMI) myocardial infarction (HCC) 10/08/2020  CHF (congestive heart failure) (HCC) 10/04/2020   Hx of CABG    Accelerating angina (HCC) 10/03/2020   Rotator cuff disorder 08/09/2019   BPH associated with nocturia 11/29/2018   Status post lumbar spine surgery for decompression of spinal cord 10/29/2018   Spinal stenosis of lumbar region 08/02/2018   Atypical mole 07/30/2017   Wheezing 07/21/2017   Sinus bradycardia 03/18/2017   Ventral hernia without obstruction or gangrene 02/25/2017   (HFpEF) heart failure with preserved ejection fraction (HCC) 09/28/2016   Ischemic  cardiomyopathy 09/28/2016   Vitamin D deficiency 06/27/2016   Vitamin B deficiency 06/27/2016   Type 2 diabetes mellitus without complication, without long-term current use of insulin (HCC) 05/20/2016   Essential hypertension 05/20/2016   Hyperlipidemia associated with type 2 diabetes mellitus (HCC) 05/20/2016   Coronary artery disease of native artery of native heart with stable angina pectoris (HCC) 05/20/2016    PCP: [name], PA   REFERRING PROVIDER: [name], PA   REFERRING DIAG: R53.1 (ICD-10-CM) - Weakness M25.60 (ICD-10-CM) - Decreased range of motion  Rationale for Evaluation and Treatment: Rehabilitation  THERAPY DIAG:  Other abnormalities of gait and mobility  Muscle weakness (generalized)  Physical deconditioning  ONSET DATE: 07/10/22  SUBJECTIVE:                                                                                                                                                                                           SUBJECTIVE STATEMENT:  Pt presents to PT with no current pain. Ambulating slopes is his main limitation.  PERTINENT HISTORY:  Patient had statin induced rhabdomyolysis, myalgia has resolved but he continues to experience weakness, tightness, limited ROM specifically in legs and lower back, shoulders and neck.  PAIN:  Are you having pain? No  PRECAUTIONS: Fall  WEIGHT BEARING RESTRICTIONS: No  FALLS:  Has patient fallen in last 6 months? Yes. Number of falls 1  OCCUPATION: retired  PLOF: Independent  PATIENT GOALS: To regain my LE strength and mobility  NEXT MD VISIT: PRN  OBJECTIVE:   DIAGNOSTIC FINDINGS:  none  PATIENT SURVEYS:  FOTO 49(64 predicted) 11/11/22: 45% 12/17/22 60%  SCREENING FOR RED FLAGS: Bowel or bladder incontinence: No  MUSCLE LENGTH: N/T  POSTURE: rounded shoulders, forward head, and decreased lumbar lordosis   LUMBAR ROM: deferred  AROM eval  Flexion   Extension   Right  lateral flexion   Left lateral flexion   Right rotation   Left rotation    (Blank rows = not tested)  LOWER EXTREMITY ROM:   WFL for gait, bed mobility and transfers  Active  Right eval  Left eval  Hip flexion    Hip extension    Hip abduction    Hip adduction    Hip internal rotation    Hip external rotation    Knee flexion    Knee extension    Ankle dorsiflexion    Ankle plantarflexion    Ankle inversion    Ankle eversion     (Blank rows = not tested)  LOWER EXTREMITY MMT:    MMT Right eval Left eval  Hip flexion 3+ 4-  Hip extension 3+ 4-  Hip abduction 3+ 4-  Hip adduction    Hip internal rotation    Hip external rotation    Knee flexion 3+ 4-  Knee extension 3+ 4-  Ankle dorsiflexion    Ankle plantarflexion 3+ 4-  Ankle inversion    Ankle eversion     (Blank rows = not tested)  LUMBAR SPECIAL TESTS:  Slump test: Negative  FUNCTIONAL TESTS:  5 times sit to stand: 27s with UE Support 2 MWT with cane 24ft  GAIT: Distance walked: 40ftx2 Assistive device utilized: Single point cane Level of assistance: Complete Independence Comments: slow cadence  TODAY'S TREATMENT:     [facility] Adult PT Treatment:                                                DATE: 12/17/22 Therapeutic Exercise: Nustep L4 x 8 min R hip flexor 30s x2 Supine QL stretch 30s x2 B  Supine curl up with p-ball 15x B, 15/15 unilaterally Supine clamshell BluTB 15x B, 15/15 unilateral S/L clams BluTB 15/15 A/P WS on 6 in step 10/10 with OH reach for balance Lateral WS onto 6 in step 10/10  Therapeutic Activity: FOTO retake Assessment of goals/progress to date  [facility] Adult PT Treatment:                                                DATE: 12/05/22 Therapeutic Exercise: Nustep L5 x 8 min R hip flexor 30s x2 Supine QL stretch 30s x2 B  Supine curl up with p-ball 10x B, 10/10 unilaterally Supine march, 3# 10/10 with OP to promote SKTC Supine clamshell BluTB 15x B, 15/15 unilateral S/L  clams BluTB 15/15   [facility] Adult PT Treatment:                                                DATE: 11/19/22 Therapeutic Exercise: Nustep L5 x 5 min while taking subjective HR 92, O2 sats 96% Supine SLR 3x10 each Supine clamshell 2x15 blue band Bridge with blue band 2x10 Supine ball squeeze 2x10 - 5" hold S/L hip abd 2x10 each LAQ 2x10 2# STS 2x10 - no UE high table Standing hip abd/ext 2x10 each  [facility] Adult PT Treatment:                                                DATE: 11/11/22 Therapeutic Exercise: Nustep level 5 x 5  mins Runners step 6 in 10/10 B heel raises from DF position 2x10 Neuromuscular re-ed: Hurdle step overs at counter lateral - 4 hurdles x2 laps Step ups 4" with airex on top x10 fwd BIL Mini squats on airex x10 Obstacle course: 3 hurdles, 2" step with airex step up and over, figure 8 weaving around foam rollers - with cane and gait belt   [facility] Adult PT Treatment:                                                DATE: 11/07/22 Therapeutic Exercise: Bridge with ball 15x Curl ups with ball for proprioception 15x Supine hip fallouts Blu TB 15x B, 15/15 unilaterally Bridge against BluTB 15x R clams BluTB 15x Runners step 6 in 10/10 B heel raises from DF position 10x Therapeutic Activity:   11/07/22 0001  Dynamic Gait Index  Level Surface 2  Change in Gait Speed 3  Gait with Horizontal Head Turns 3  Gait with Vertical Head Turns 2  Gait and Pivot Turn 2  Step Over Obstacle 3  Step Around Obstacles 3  Steps 2  Total Score 20    [facility] Adult PT Treatment:                                                DATE: 11/04/22 Therapeutic Exercise: Ambulation of 531ft with with SPC  R SLR 15x Supine hip fallouts Blu TB 15x B, 15/15 unilaterally S/L R clams BluTB 15x SAQs R 2# 15x Supine march 15/15 2# WS onto 6 in step A/P 10/10, lateral 10x B no UE support  [facility] Adult PT Treatment:                                                DATE: 10/28/22 Therapeutic  Exercise: Nustep level 5 x 8 mins Sidestepping at counter x 4 laps Heel raise at counter 2x10 Standing marching at counter 3x30" Seated pball push down TA activation 3" hold 2x10 STS x10 no UE Therapeutic Activity: Hurdle step overs at counter fwd/lat - 4 hurdles Step ups 4" with airex on top x10 fwd BIL Step ups lateral onto airex x10 BIL Mini squats on airex  PATIENT EDUCATION:  Education details: Discussed eval findings, rehab rationale and POC and patient is in agreement  Person educated: Patient Education method: Explanation Education comprehension: verbalized understanding and needs further education  HOME EXERCISE PROGRAM: Access Code: [email or web address] URL: [email or web address] Date: 10/17/2022 Prepared by: Gustavus Bryant  Exercises - Sit to Stand with Armchair  - 2 x daily - 5 x weekly - 1 sets - 5 reps - Standing Heel Raise with Support  - 2 x daily - 5 x weekly - 2 sets - 10 reps - Supine Bridge  - 2 x daily - 5 x weekly - 2 sets - 10 reps - Curl Up with Reach  - 2 x daily - 5 x weekly - 2 sets - 10 reps - Supine March with Posterior Pelvic Tilt  - 2 x daily - 5 x weekly - 2 sets - 10 reps - 30s  hold - Sidelying Open Book Thoracic Lumbar Rotation and Extension  - 2 x daily - 5 x weekly - 1 sets - 10 reps  ASSESSMENT:  CLINICAL IMPRESSION: FOTO and goals re-assessed with progress noted.  Advanced to stepping and WS tasks to improve stability.  Increased reps and challenge of exercises.  As transitioned to fitness club to address remaining strength and endurance deficits.  OBJECTIVE IMPAIRMENTS: Abnormal gait, decreased activity tolerance, decreased balance, decreased coordination, decreased endurance, decreased mobility, difficulty walking, decreased strength, and postural dysfunction.   ACTIVITY LIMITATIONS: carrying, lifting, standing, squatting, stairs, transfers, and bed mobility  PERSONAL FACTORS: Age, Past/current experiences, and Time since onset of  injury/illness/exacerbation are also affecting patient's functional outcome.   REHAB POTENTIAL: Good  CLINICAL DECISION MAKING: Evolving/moderate complexity  EVALUATION COMPLEXITY: Low   GOALS: Goals reviewed with patient? No  SHORT TERM GOALS: Target date: 10/21/2022  Patient to demonstrate independence in HEP Baseline: [email or web address] Goal status: MET  2.  500 ft ambulation with SPC  Baseline: 224ft w/cane; 11/04/22 538ft w/cane Goal status: Met  LONG TERM GOALS: Target date: 12/08/22  Decrease 5x STS time to 20s arms crossed Baseline: 27s arms used; 11/19/22 13s arms crossed Goal status: Met  2.  Increase distance on 2 MWT to 362ft w/cane Baseline: 225ft w/cane; 11/19/22 285ft w/cane Goal status: Ongoing  3.  Increase BLE strength to 4/5 Baseline:  MMT Right eval Left eval  Hip flexion 3+ 4-  Hip extension 3+ 4-  Hip abduction 3+ 4-  Hip adduction    Hip internal rotation    Hip external rotation    Knee flexion 3+ 4-  Knee extension 3+ 4-  Ankle dorsiflexion    Ankle plantarflexion 3+ 4-   Goal status: INITIAL  4.  Increase FOTO score to 64 Baseline: 49; 11/14/22 45; 12/17/22 60 Goal status: Ongoing   PLAN:  PT FREQUENCY: 1x/week  PT DURATION: 4 weeks (beginning 12/03/22)  PLANNED INTERVENTIONS: Therapeutic exercises, Therapeutic activity, Neuromuscular re-education, Balance training, Gait training, Patient/Family education, Self Care, Joint mobilization, Aquatic Therapy, Dry Needling, Electrical stimulation, Cryotherapy, Moist heat, Manual therapy, and Re-evaluation.  PLAN FOR NEXT SESSION: HEP review and update, manual techniques as appropriate, aerobic tasks, ROM and flexibility activities, strengthening and PREs, TPDN, gait and balance training as needed     Hildred Laser, PT 12/17/2022, 1:59 PM

## 2022-12-17 ENCOUNTER — Ambulatory Visit (INDEPENDENT_AMBULATORY_CARE_PROVIDER_SITE_OTHER): Payer: Medicare HMO

## 2022-12-17 ENCOUNTER — Ambulatory Visit: Payer: Medicare HMO | Attending: Family Medicine

## 2022-12-17 DIAGNOSIS — R5381 Other malaise: Secondary | ICD-10-CM | POA: Insufficient documentation

## 2022-12-17 DIAGNOSIS — M6281 Muscle weakness (generalized): Secondary | ICD-10-CM | POA: Insufficient documentation

## 2022-12-17 DIAGNOSIS — I255 Ischemic cardiomyopathy: Secondary | ICD-10-CM | POA: Diagnosis not present

## 2022-12-17 DIAGNOSIS — R2689 Other abnormalities of gait and mobility: Secondary | ICD-10-CM | POA: Diagnosis not present

## 2022-12-17 LAB — CUP PACEART REMOTE DEVICE CHECK
Battery Remaining Longevity: 119 mo
Battery Voltage: 3.03 V
Brady Statistic RV Percent Paced: 0.49 %
Date Time Interrogation Session: 20240911033422
HighPow Impedance: 60 Ohm
Implantable Lead Connection Status: 753985
Implantable Lead Implant Date: 20221214
Implantable Lead Location: 753860
Implantable Pulse Generator Implant Date: 20221214
Lead Channel Impedance Value: 266 Ohm
Lead Channel Impedance Value: 380 Ohm
Lead Channel Pacing Threshold Amplitude: 0.875 V
Lead Channel Pacing Threshold Pulse Width: 0.4 ms
Lead Channel Sensing Intrinsic Amplitude: 14.375 mV
Lead Channel Sensing Intrinsic Amplitude: 14.375 mV
Lead Channel Setting Pacing Amplitude: 1.5 V
Lead Channel Setting Pacing Pulse Width: 0.4 ms
Lead Channel Setting Sensing Sensitivity: 0.3 mV
Zone Setting Status: 755011
Zone Setting Status: 755011

## 2022-12-18 ENCOUNTER — Encounter: Payer: Self-pay | Admitting: Internal Medicine

## 2022-12-18 ENCOUNTER — Ambulatory Visit: Payer: Medicare HMO | Attending: Internal Medicine | Admitting: Internal Medicine

## 2022-12-18 VITALS — BP 102/58 | HR 67 | Ht 67.0 in | Wt 154.8 lb

## 2022-12-18 DIAGNOSIS — M6282 Rhabdomyolysis: Secondary | ICD-10-CM

## 2022-12-18 DIAGNOSIS — I480 Paroxysmal atrial fibrillation: Secondary | ICD-10-CM | POA: Diagnosis not present

## 2022-12-18 DIAGNOSIS — I214 Non-ST elevation (NSTEMI) myocardial infarction: Secondary | ICD-10-CM

## 2022-12-18 DIAGNOSIS — E1169 Type 2 diabetes mellitus with other specified complication: Secondary | ICD-10-CM

## 2022-12-18 DIAGNOSIS — E785 Hyperlipidemia, unspecified: Secondary | ICD-10-CM | POA: Diagnosis not present

## 2022-12-18 DIAGNOSIS — T466X5A Adverse effect of antihyperlipidemic and antiarteriosclerotic drugs, initial encounter: Secondary | ICD-10-CM

## 2022-12-18 DIAGNOSIS — I25118 Atherosclerotic heart disease of native coronary artery with other forms of angina pectoris: Secondary | ICD-10-CM

## 2022-12-18 DIAGNOSIS — E782 Mixed hyperlipidemia: Secondary | ICD-10-CM

## 2022-12-18 DIAGNOSIS — I5022 Chronic systolic (congestive) heart failure: Secondary | ICD-10-CM | POA: Diagnosis not present

## 2022-12-18 NOTE — Patient Instructions (Signed)
Medication Instructions:  No changes at this time.   *If you need a refill on your cardiac medications before your next appointment, please call your pharmacy*   Lab Work: Lipid & CMP today   If you have labs (blood work) drawn today and your tests are completely normal, you will receive your results only by: MyChart Message (if you have MyChart) OR A paper copy in the mail If you have any lab test that is abnormal or we need to change your treatment, we will call you to review the results.   Testing/Procedures: None   Follow-Up: At [facility], you and your health needs are our priority.  As part of our continuing mission to provide you with exceptional heart care, we have created designated Provider Care Teams.  These Care Teams include your primary Cardiologist (physician) and Advanced Practice Providers (APPs -  Physician Assistants and Nurse Practitioners) who all work together to provide you with the care you need, when you need it.   Your next appointment:   3 month(s)  Provider:   [name], MD

## 2022-12-18 NOTE — Progress Notes (Signed)
Cardiology Office Note:  .   Date:  12/19/2022  ID:  Jacob Harrison, DOB September 14, 1940, MRN [id number] PCP: [name], PA  [facility] Providers Cardiologist:  [name], MD Electrophysiologist:  [name], MD     History of Present Illness: .   Jacob Harrison is a 82 y.o. male with history of coronary artery disease status post CABG and redo CABG (see details below), type 2 diabetes mellitus, chronic HFrEF due to ischemic cardiomyopathy status post ICD, paroxysmal atrial fibrillation, hyperlipidemia complicated by rhabdomyolysis with statin therapy, and chronic leg pain in the setting of spinal stenosis who presents for follow-up of coronary artery disease and HFrEF.  He was last seen in our office in June by [name], [place], at which time he was feeling a little bit better with some weight loss and improvement in his muscle weakness (particularly in the legs).  Mild shortness of breath was reported after eating.  He had not had any chest pain.  No medication changes were made.  He was referred to the lipid clinic for further management of his hyperlipidemia and preceding rhabdomyolysis with statins.  Mr. Apostolopoulos reached out to [place] earlier this week regarding potential interaction between ranolazine and afluzosin that had just been prescribed for his BPH.  Today, Mr. Knack reports that he has been feeling fairly well though he occasionally notices dyspnea and mild tightness in the chest when he exercises and his heart rate gets above 90-100 bpm.  He typically does not have symptoms with his daily activities or at rest, though he notes an episode of chest tightness and shortness of breath that woke him up 3 weeks ago.  His blood pressure was higher than normal at 140/95.  The symptoms resolved after taking sublingual nitroglycerin x 2 and have not recurred.  He denies palpitations and lightheadedness.  His leg weakness has resolved.  He is tolerating  evolocumab well, having started this about 6 to 8 weeks ago.  He notes mild dependent edema in the left foot, which is chronic and stable.  ROS: See HPI  Studies Reviewed: Marland Kitchen   EKG Interpretation Date/Time:  Thursday December 18 2022 12:10:57 EDT Ventricular Rate:  59 PR Interval:  208 QRS Duration:  80 QT Interval:  422 QTC Calculation: 417 R Axis:   7  Text Interpretation: Sinus bradycardia with First degree A-V block Inferior infarct (cited on or before 27-Aug-2022) Anterolateral infarct (cited on or before 27-Aug-2022) When compared with ECG of 15-Sep-2022 PR interval has increased Otherwise no significant change Confirmed by Mieka Leaton [phone]) on 12/19/2022 11:58:59 AM    Risk Assessment/Calculations:    CHA2DS2-VASc Score = 6   This indicates a 9.7% annual risk of stroke. The patient's score is based upon: CHF History: 1 HTN History: 1 Diabetes History: 1 Stroke History: 0 Vascular Disease History: 1 Age Score: 2 Gender Score: 0            Physical Exam:   VS:  BP (!) 102/58 (BP Location: Left Arm, Patient Position: Sitting, Cuff Size: Normal)   Pulse 67   Ht 5\' 7"  (1.702 m)   Wt 154 lb 12.8 oz (70.2 kg)   SpO2 96%   BMI 24.25 kg/m    Wt Readings from Last 3 Encounters:  12/18/22 154 lb 12.8 oz (70.2 kg)  11/19/22 157 lb (71.2 kg)  10/02/22 148 lb (67.1 kg)    General:  NAD. Neck: No JVD or HJR. Lungs: Left basilar  crackles with good air movement. Heart: Regular rate and rhythm with 2/6 systolic murmur. Abdomen: Soft, nontender, nondistended. Extremities: Trace ankle edema bilaterally.  ASSESSMENT AND PLAN: .    Coronary artery disease with stable angina: Mr. Swingler has stable exertional symptoms and reports only a single episode of discomfort that woke him up a couple of weeks ago.  Recent ICD interrogation did not show any arrhythmias during that.  We have agreed to defer additional testing and medication changes at this time.  Given his severe  CAD, any attempted intervention would be very high risk.  We will therefore defer repeat catheterization for refractory symptoms.  EKG shows acceptable QTc today with addition of afluzosin.  We will continue current dose of ranolazine as well as current regimen of carvedilol and isosorbide mononitrate.  Chronic HFrEF: Mr. Fadness appears euvolemic other than chronic trace pretibial edema, with stable NYHA class II symptoms.  Continue current doses of carvedilol, furosemide, losartan, and empagliflozin, as soft blood pressure precludes escalation.  Paroxysmal atrial fibrillation: Question if recent episode of chest pain and dyspnea that woke Mr. Grzesik up was due to a transient arrhythmia.  However, ICD did not detect any arrhythmias.  However, ventricular rates may have been below detection threshold, particularly with the lack of an atrial lead.  We will defer further testing and medication changes unless symptoms recur or ICD detects elevated rates.  Continue indefinite anticoagulation with apixaban.  Hyperlipidemia associated with type 2 diabetes mellitus and statin induced rhabdomyolysis: Mr. Youngers's leg weakness due to rhabdomyolysis, thought to be due to statin therapy, has resolved.  He is tolerating evolocumab well.  We will check a CMP and lipid panel today to assess response.    Dispo: Return to clinic in 3 months.   Signed, [name], MD

## 2022-12-19 ENCOUNTER — Encounter: Payer: Self-pay | Admitting: Internal Medicine

## 2022-12-19 LAB — COMPREHENSIVE METABOLIC PANEL
ALT: 22 IU/L (ref 0–44)
AST: 20 IU/L (ref 0–40)
Albumin: 4.3 g/dL (ref 3.8–4.8)
Alkaline Phosphatase: 110 IU/L (ref 44–121)
BUN/Creatinine Ratio: 34 — ABNORMAL HIGH (ref 10–24)
BUN: 25 mg/dL (ref 8–27)
Bilirubin Total: 0.5 mg/dL (ref 0.0–1.2)
CO2: 21 mmol/L (ref 20–29)
Calcium: 9.3 mg/dL (ref 8.6–10.2)
Chloride: 97 mmol/L (ref 96–106)
Creatinine, Ser: 0.74 mg/dL — ABNORMAL LOW (ref 0.76–1.27)
Globulin, Total: 2.2 g/dL (ref 1.5–4.5)
Glucose: 95 mg/dL (ref 70–99)
Potassium: 4.5 mmol/L (ref 3.5–5.2)
Sodium: 137 mmol/L (ref 134–144)
Total Protein: 6.5 g/dL (ref 6.0–8.5)
eGFR: 92 mL/min/{1.73_m2} (ref >59)

## 2022-12-19 LAB — LIPID PANEL
Chol/HDL Ratio: 2.4 ratio (ref 0.0–5.0)
Cholesterol, Total: 147 mg/dL (ref 100–199)
HDL: 61 mg/dL (ref >39)
LDL Chol Calc (NIH): 63 mg/dL (ref 0–99)
Triglycerides: 136 mg/dL (ref 0–149)
VLDL Cholesterol Cal: 23 mg/dL (ref 5–40)

## 2022-12-23 NOTE — Therapy (Unsigned)
OUTPATIENT PHYSICAL THERAPY TREATMENT NOTE/10TH VISIT PROGRESS NOTE   Patient Name: Jacob Harrison MRN: [id number] DOB:July 18, 1940, 82 y.o., male Today's Date: 12/23/2022 Physical Therapy Progress Note   END OF SESSION:         Past Medical History:  Diagnosis Date   (HFpEF) heart failure with preserved ejection fraction (HCC)    a. 10/2016 Echo: EF 65-70%, mild LVH, mildly dil LA. RV fxn nl.   Arthritis    BPH (benign prostatic hyperplasia)    Coronary artery disease    a. 2001 CABG x 3 [facility]): LIMA->LAD, VG->LCX, VG->RPDA; b. 04/05/2010 Cath ([place]): LM 9m, LAD 140m, LCX 62m, RCA 100p, LIMA->LAD atretic, VG->LCX 100, VG->RCA 80ost, 70d; c. 04/2010 Redo CABG x 2 ([place]): VG->LAD, VG->RPDA; d. 10/2012 Cath [facility]): LM 20-30, LAD 100/95-10m, LCX 40-6m, RCA 100p, 80-100d, RPDA 99, RPL1 95, VG->LAD ok, VG->RPDA ok, EF 67%; e. 11/2016 MV: EF 48%, No ischemia.   Diabetes mellitus without complication (HCC)    Type II   Hyperlipidemia    Hypertension    Ischemic cardiomyopathy    a. 03/2021 s/p MDT Visia AF MRI VR SureScan single lead AICD (ser# [id number]).   Scoliosis    Spinal stenosis    Past Surgical History:  Procedure Laterality Date   CARDIAC CATHETERIZATION     CORONARY ARTERY BYPASS GRAFT     x 2 (2001 and redo in late 2011 or early 2012)   HERNIA REPAIR Left    inguinal   ICD IMPLANT N/A 03/20/2021   Procedure: ICD IMPLANT;  Surgeon: [name], MD;  Location: [facility] INVASIVE CV LAB;  Service: Cardiovascular;  Laterality: N/A;   LEFT HEART CATH AND CORS/GRAFTS ANGIOGRAPHY N/A 10/04/2020   Procedure: LEFT HEART CATH AND CORS/GRAFTS ANGIOGRAPHY;  Surgeon: [name], MD;  Location: [facility] INVASIVE CV LAB;  Service: Cardiovascular;  Laterality: N/A;   LUMBAR LAMINECTOMY/DECOMPRESSION MICRODISCECTOMY N/A 10/20/2018   Procedure: L3-4, L4-5 LUMBAR DECOMPRESSION with dural repair.;  Surgeon: [name], MD;  Location: [facility] OR;  Service: Orthopedics;   Laterality: N/A;   RIGHT/LEFT HEART CATH AND CORONARY/GRAFT ANGIOGRAPHY N/A 05/27/2022   Procedure: RIGHT/LEFT HEART CATH AND CORONARY/GRAFT ANGIOGRAPHY;  Surgeon: [name], MD;  Location: [facility] INVASIVE CV LAB;  Service: Cardiovascular;  Laterality: N/A;   TONSILLECTOMY AND ADENOIDECTOMY     TRANSURETHRAL RESECTION OF PROSTATE  1990   Patient Active Problem List   Diagnosis Date Noted   Muscle weakness 11/19/2022   Statins contraindicated 10/16/2022   Paroxysmal atrial fibrillation (HCC) 08/27/2022   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) 08/27/2022   ICD (implantable cardioverter-defibrillator) in place 08/05/2022   Statin-induced rhabdomyolysis 07/05/2022   Transaminitis 07/05/2022   Leukocytosis 07/05/2022   Chronic low back pain 07/02/2022   Dyspnea on exertion 05/22/2022   Chronic HFrEF (heart failure with reduced ejection fraction) (HCC) 12/13/2021   Acute on chronic heart failure with preserved ejection fraction (HFpEF) (HCC) 10/08/2020   Non-ST elevation (NSTEMI) myocardial infarction (HCC) 10/08/2020   CHF (congestive heart failure) (HCC) 10/04/2020   Hx of CABG    Accelerating angina (HCC) 10/03/2020   Rotator cuff disorder 08/09/2019   BPH associated with nocturia 11/29/2018   Status post lumbar spine surgery for decompression of spinal cord 10/29/2018   Spinal stenosis of lumbar region 08/02/2018   Atypical mole 07/30/2017   Wheezing 07/21/2017   Sinus bradycardia 03/18/2017   Ventral hernia without obstruction or gangrene 02/25/2017   (HFpEF) heart failure with preserved ejection fraction (HCC)  09/28/2016   Ischemic cardiomyopathy 09/28/2016   Vitamin D deficiency 06/27/2016   Vitamin B deficiency 06/27/2016   Type 2 diabetes mellitus without complication, without long-term current use of insulin (HCC) 05/20/2016   Essential hypertension 05/20/2016   Hyperlipidemia associated with type 2 diabetes mellitus (HCC) 05/20/2016    Coronary artery disease of native artery of native heart with stable angina pectoris (HCC) 05/20/2016    PCP: [name], PA   REFERRING PROVIDER: [name], PA   REFERRING DIAG: R53.1 (ICD-10-CM) - Weakness M25.60 (ICD-10-CM) - Decreased range of motion  Rationale for Evaluation and Treatment: Rehabilitation  THERAPY DIAG:  No diagnosis found.  ONSET DATE: 07/10/22  SUBJECTIVE:                                                                                                                                                                                           SUBJECTIVE STATEMENT:  Pt presents to PT with no current pain. Ambulating slopes is his main limitation.  PERTINENT HISTORY:  Patient had statin induced rhabdomyolysis, myalgia has resolved but he continues to experience weakness, tightness, limited ROM specifically in legs and lower back, shoulders and neck.  PAIN:  Are you having pain? No  PRECAUTIONS: Fall  WEIGHT BEARING RESTRICTIONS: No  FALLS:  Has patient fallen in last 6 months? Yes. Number of falls 1  OCCUPATION: retired  PLOF: Independent  PATIENT GOALS: To regain my LE strength and mobility  NEXT MD VISIT: PRN  OBJECTIVE:   DIAGNOSTIC FINDINGS:  none  PATIENT SURVEYS:  FOTO 49(64 predicted) 11/11/22: 45% 12/17/22 60%  SCREENING FOR RED FLAGS: Bowel or bladder incontinence: No  MUSCLE LENGTH: N/T  POSTURE: rounded shoulders, forward head, and decreased lumbar lordosis   LUMBAR ROM: deferred  AROM eval  Flexion   Extension   Right lateral flexion   Left lateral flexion   Right rotation   Left rotation    (Blank rows = not tested)  LOWER EXTREMITY ROM:   WFL for gait, bed mobility and transfers  Active  Right eval Left eval  Hip flexion    Hip extension    Hip abduction    Hip adduction    Hip internal rotation    Hip external rotation    Knee flexion    Knee extension    Ankle dorsiflexion    Ankle  plantarflexion    Ankle inversion    Ankle eversion     (Blank rows = not tested)  LOWER EXTREMITY MMT:    MMT Right eval Left eval  Hip flexion 3+ 4-  Hip extension 3+ 4-  Hip  abduction 3+ 4-  Hip adduction    Hip internal rotation    Hip external rotation    Knee flexion 3+ 4-  Knee extension 3+ 4-  Ankle dorsiflexion    Ankle plantarflexion 3+ 4-  Ankle inversion    Ankle eversion     (Blank rows = not tested)  LUMBAR SPECIAL TESTS:  Slump test: Negative  FUNCTIONAL TESTS:  5 times sit to stand: 27s with UE Support 2 MWT with cane 277ft  GAIT: Distance walked: 24ftx2 Assistive device utilized: Single point cane Level of assistance: Complete Independence Comments: slow cadence  TODAY'S TREATMENT:     [facility] Adult PT Treatment:                                                DATE: 12/17/22 Therapeutic Exercise: Nustep L4 x 8 min R hip flexor 30s x2 Supine QL stretch 30s x2 B  Supine curl up with p-ball 15x B, 15/15 unilaterally Supine clamshell BluTB 15x B, 15/15 unilateral S/L clams BluTB 15/15 A/P WS on 6 in step 10/10 with OH reach for balance Lateral WS onto 6 in step 10/10  Therapeutic Activity: FOTO retake Assessment of goals/progress to date  [facility] Adult PT Treatment:                                                DATE: 12/05/22 Therapeutic Exercise: Nustep L5 x 8 min R hip flexor 30s x2 Supine QL stretch 30s x2 B  Supine curl up with p-ball 10x B, 10/10 unilaterally Supine march, 3# 10/10 with OP to promote SKTC Supine clamshell BluTB 15x B, 15/15 unilateral S/L clams BluTB 15/15   [facility] Adult PT Treatment:                                                DATE: 11/19/22 Therapeutic Exercise: Nustep L5 x 5 min while taking subjective HR 92, O2 sats 96% Supine SLR 3x10 each Supine clamshell 2x15 blue band Bridge with blue band 2x10 Supine ball squeeze 2x10 - 5" hold S/L hip abd 2x10 each LAQ 2x10 2# STS 2x10 - no UE high table Standing hip  abd/ext 2x10 each  [facility] Adult PT Treatment:                                                DATE: 11/11/22 Therapeutic Exercise: Nustep level 5 x 5 mins Runners step 6 in 10/10 B heel raises from DF position 2x10 Neuromuscular re-ed: Hurdle step overs at counter lateral - 4 hurdles x2 laps Step ups 4" with airex on top x10 fwd BIL Mini squats on airex x10 Obstacle course: 3 hurdles, 2" step with airex step up and over, figure 8 weaving around foam rollers - with cane and gait belt   [facility] Adult PT Treatment:  DATE: 11/07/22 Therapeutic Exercise: Bridge with ball 15x Curl ups with ball for proprioception 15x Supine hip fallouts Blu TB 15x B, 15/15 unilaterally Bridge against BluTB 15x R clams BluTB 15x Runners step 6 in 10/10 B heel raises from DF position 10x Therapeutic Activity:   11/07/22 0001  Dynamic Gait Index  Level Surface 2  Change in Gait Speed 3  Gait with Horizontal Head Turns 3  Gait with Vertical Head Turns 2  Gait and Pivot Turn 2  Step Over Obstacle 3  Step Around Obstacles 3  Steps 2  Total Score 20    [facility] Adult PT Treatment:                                                DATE: 11/04/22 Therapeutic Exercise: Ambulation of 540ft with with SPC  R SLR 15x Supine hip fallouts Blu TB 15x B, 15/15 unilaterally S/L R clams BluTB 15x SAQs R 2# 15x Supine march 15/15 2# WS onto 6 in step A/P 10/10, lateral 10x B no UE support  [facility] Adult PT Treatment:                                                DATE: 10/28/22 Therapeutic Exercise: Nustep level 5 x 8 mins Sidestepping at counter x 4 laps Heel raise at counter 2x10 Standing marching at counter 3x30" Seated pball push down TA activation 3" hold 2x10 STS x10 no UE Therapeutic Activity: Hurdle step overs at counter fwd/lat - 4 hurdles Step ups 4" with airex on top x10 fwd BIL Step ups lateral onto airex x10 BIL Mini squats on airex  PATIENT EDUCATION:   Education details: Discussed eval findings, rehab rationale and POC and patient is in agreement  Person educated: Patient Education method: Explanation Education comprehension: verbalized understanding and needs further education  HOME EXERCISE PROGRAM: Access Code: [email or web address] URL: [email or web address] Date: 10/17/2022 Prepared by: Gustavus Bryant  Exercises - Sit to Stand with Armchair  - 2 x daily - 5 x weekly - 1 sets - 5 reps - Standing Heel Raise with Support  - 2 x daily - 5 x weekly - 2 sets - 10 reps - Supine Bridge  - 2 x daily - 5 x weekly - 2 sets - 10 reps - Curl Up with Reach  - 2 x daily - 5 x weekly - 2 sets - 10 reps - Supine March with Posterior Pelvic Tilt  - 2 x daily - 5 x weekly - 2 sets - 10 reps - 30s hold - Sidelying Open Book Thoracic Lumbar Rotation and Extension  - 2 x daily - 5 x weekly - 1 sets - 10 reps  ASSESSMENT:  CLINICAL IMPRESSION: FOTO and goals re-assessed with progress noted.  Advanced to stepping and WS tasks to improve stability.  Increased reps and challenge of exercises.  As transitioned to fitness club to address remaining strength and endurance deficits.  OBJECTIVE IMPAIRMENTS: Abnormal gait, decreased activity tolerance, decreased balance, decreased coordination, decreased endurance, decreased mobility, difficulty walking, decreased strength, and postural dysfunction.   ACTIVITY LIMITATIONS: carrying, lifting, standing, squatting, stairs, transfers, and bed mobility  PERSONAL FACTORS: Age, Past/current experiences, and Time since onset of injury/illness/exacerbation are also affecting  patient's functional outcome.   REHAB POTENTIAL: Good  CLINICAL DECISION MAKING: Evolving/moderate complexity  EVALUATION COMPLEXITY: Low   GOALS: Goals reviewed with patient? No  SHORT TERM GOALS: Target date: 10/21/2022  Patient to demonstrate independence in HEP Baseline: [id number] Goal status: MET  2.  500 ft ambulation with SPC   Baseline: 275ft w/cane; 11/04/22 583ft w/cane Goal status: Met  LONG TERM GOALS: Target date: 12/08/22  Decrease 5x STS time to 20s arms crossed Baseline: 27s arms used; 11/19/22 13s arms crossed Goal status: Met  2.  Increase distance on 2 MWT to 313ft w/cane Baseline: 262ft w/cane; 11/19/22 258ft w/cane Goal status: Ongoing  3.  Increase BLE strength to 4/5 Baseline:  MMT Right eval Left eval  Hip flexion 3+ 4-  Hip extension 3+ 4-  Hip abduction 3+ 4-  Hip adduction    Hip internal rotation    Hip external rotation    Knee flexion 3+ 4-  Knee extension 3+ 4-  Ankle dorsiflexion    Ankle plantarflexion 3+ 4-   Goal status: INITIAL  4.  Increase FOTO score to 64 Baseline: 49; 11/14/22 45; 12/17/22 60 Goal status: Ongoing   PLAN:  PT FREQUENCY: 1x/week  PT DURATION: 4 weeks (beginning 12/03/22)  PLANNED INTERVENTIONS: Therapeutic exercises, Therapeutic activity, Neuromuscular re-education, Balance training, Gait training, Patient/Family education, Self Care, Joint mobilization, Aquatic Therapy, Dry Needling, Electrical stimulation, Cryotherapy, Moist heat, Manual therapy, and Re-evaluation.  PLAN FOR NEXT SESSION: HEP review and update, manual techniques as appropriate, aerobic tasks, ROM and flexibility activities, strengthening and PREs, TPDN, gait and balance training as needed     Hildred Laser, PT 12/23/2022, 11:48 AM

## 2022-12-24 ENCOUNTER — Ambulatory Visit: Payer: Medicare HMO

## 2022-12-24 DIAGNOSIS — R2689 Other abnormalities of gait and mobility: Secondary | ICD-10-CM

## 2022-12-24 DIAGNOSIS — R5381 Other malaise: Secondary | ICD-10-CM | POA: Diagnosis not present

## 2022-12-24 DIAGNOSIS — M6281 Muscle weakness (generalized): Secondary | ICD-10-CM | POA: Diagnosis not present

## 2022-12-30 DIAGNOSIS — N35814 Other anterior urethral stricture, male: Secondary | ICD-10-CM | POA: Diagnosis not present

## 2022-12-30 DIAGNOSIS — N200 Calculus of kidney: Secondary | ICD-10-CM | POA: Diagnosis not present

## 2023-01-02 ENCOUNTER — Other Ambulatory Visit: Payer: Self-pay | Admitting: Urology

## 2023-01-02 ENCOUNTER — Telehealth: Payer: Self-pay | Admitting: Internal Medicine

## 2023-01-02 NOTE — Telephone Encounter (Signed)
Patient with diagnosis of afib on Eliquis for anticoagulation.    Procedure: Cysto with Urethral Dilation  Date of procedure: 01/13/2023   CHA2DS2-VASc Score = 6   This indicates a 9.7% annual risk of stroke. The patient's score is based upon: CHF History: 1 HTN History: 1 Diabetes History: 1 Stroke History: 0 Vascular Disease History: 1 Age Score: 2 Gender Score: 0     CrCl 76 mL/min Platelet count 341 K   Per office protocol, patient can hold Eliquis for 2 days prior to procedure.     **This guidance is not considered finalized until pre-operative APP has relayed final recommendations.**

## 2023-01-02 NOTE — Telephone Encounter (Signed)
Name: Jacob Harrison  DOB: 11-09-1940  MRN: [id number]   Primary Cardiologist: [name], MD  Chart reviewed as part of pre-operative protocol coverage.   We have been asked for guidance to hold oral anticoagulation for upcoming procedure. Per our clinical pharmacist:  Per office protocol, patient can hold Eliquis for 2 days prior to procedure.    I will reach out to Dr. Okey Dupre regarding ASA hold given that all bypass grafts are occluded and there is severe disease in the LAD. In addition, he has episodes of chest pain.  I will route this recommendation to the requesting party via Epic fax function and remove from pre-op pool. Please call with questions.  Roe Rutherford Avi Kerschner, PA 01/02/2023, 10:19 AM

## 2023-01-02 NOTE — Telephone Encounter (Signed)
Apixaban can be held for 2 days prior to cystoscopy.  Given history of extensive CAD and CABG/redo CABG, I recommend that aspirin 81 mg daily be continued throughout the perioperative period.  [name], MD [facility]

## 2023-01-02 NOTE — Telephone Encounter (Signed)
Pre-operative Risk Assessment    Patient Name: Jacob Harrison  DOB: 08-Apr-1940 MRN: [id number]      Request for Surgical Clearance    Procedure:  Cysto with Urethral Dilation   Date of Surgery:  Clearance 01/13/23                                 Surgeon:  Dr. Liliane Shi Surgeon's Group or Practice Name:  [facility] Phone number:  [phone] Fax number:  [phone]   Type of Clearance Requested:  Pharmacy,  hold Eliquis & Aspirin 81mg     Type of Anesthesia:  Not Indicated   Additional requests/questions:    Signed, Narda Amber   01/02/2023, 9:15 AM

## 2023-01-02 NOTE — Telephone Encounter (Signed)
     Primary Cardiologist: [name], MD  Chart reviewed as part of pre-operative protocol coverage. Given past medical history and time since last visit, based on ACC/AHA guidelines, Melville Engen would be at acceptable risk for the planned procedure without further cardiovascular testing.   Per Dr. Okey Dupre-  "Apixaban can be held for 2 days prior to cystoscopy. Given history of extensive CAD and CABG/redo CABG, I recommend that aspirin 81 mg daily be continued throughout the perioperative period."   I will route this recommendation to the requesting party via Epic fax function and remove from pre-op pool.  Please call with questions.  Thomasene Ripple. Venie Montesinos NP-C     01/02/2023, 11:00 AM [facility] [address] Office [phone] Fax [phone]

## 2023-01-05 NOTE — Progress Notes (Signed)
Remote ICD transmission.   

## 2023-01-05 NOTE — Therapy (Unsigned)
OUTPATIENT PHYSICAL THERAPY TREATMENT NOTE/DC SUMMARY   Patient Name: Jacob Harrison MRN: [id number] DOB:June 26, 1940, 82 y.o., male Today's Date: 01/06/2023  PHYSICAL THERAPY DISCHARGE SUMMARY  Visits from Start of Care: 12  Current functional level related to goals / functional outcomes: Goals met   Remaining deficits: None   Education / Equipment: HEP   Patient agrees to discharge. Patient goals were met. Patient is being discharged due to being pleased with the current functional level.  END OF SESSION:  PT End of Session - 01/06/23 1317     Visit Number 12    Number of Visits 12    Date for PT Re-Evaluation 01/19/23    Authorization Type [employer] MCR    Progress Note Due on Visit 10    PT Start Time 1315    PT Stop Time 1355    PT Time Calculation (min) 40 min    Activity Tolerance Patient tolerated treatment well    Behavior During Therapy WFL for tasks assessed/performed            Past Medical History:  Diagnosis Date   (HFpEF) heart failure with preserved ejection fraction (HCC)    a. 10/2016 Echo: EF 65-70%, mild LVH, mildly dil LA. RV fxn nl.   Arthritis    BPH (benign prostatic hyperplasia)    Coronary artery disease    a. 2001 CABG x 3 [facility]): LIMA->LAD, VG->LCX, VG->RPDA; b. 04/05/2010 Cath ([place]): LM 22m, LAD 152m, LCX 55m, RCA 100p, LIMA->LAD atretic, VG->LCX 100, VG->RCA 80ost, 70d; c. 04/2010 Redo CABG x 2 ([place]): VG->LAD, VG->RPDA; d. 10/2012 Cath [facility]): LM 20-30, LAD 100/95-43m, LCX 40-23m, RCA 100p, 80-100d, RPDA 99, RPL1 95, VG->LAD ok, VG->RPDA ok, EF 67%; e. 11/2016 MV: EF 48%, No ischemia.   Diabetes mellitus without complication (HCC)    Type II   Hyperlipidemia    Hypertension    Ischemic cardiomyopathy    a. 03/2021 s/p MDT Visia AF MRI VR SureScan single lead AICD (ser# [id number]).   Scoliosis    Spinal stenosis    Past Surgical History:  Procedure Laterality Date   CARDIAC CATHETERIZATION     CORONARY ARTERY BYPASS  GRAFT     x 2 (2001 and redo in late 2011 or early 2012)   HERNIA REPAIR Left    inguinal   ICD IMPLANT N/A 03/20/2021   Procedure: ICD IMPLANT;  Surgeon: [name], MD;  Location: [facility] INVASIVE CV LAB;  Service: Cardiovascular;  Laterality: N/A;   LEFT HEART CATH AND CORS/GRAFTS ANGIOGRAPHY N/A 10/04/2020   Procedure: LEFT HEART CATH AND CORS/GRAFTS ANGIOGRAPHY;  Surgeon: [name], MD;  Location: [facility] INVASIVE CV LAB;  Service: Cardiovascular;  Laterality: N/A;   LUMBAR LAMINECTOMY/DECOMPRESSION MICRODISCECTOMY N/A 10/20/2018   Procedure: L3-4, L4-5 LUMBAR DECOMPRESSION with dural repair.;  Surgeon: [name], MD;  Location: [facility] OR;  Service: Orthopedics;  Laterality: N/A;   RIGHT/LEFT HEART CATH AND CORONARY/GRAFT ANGIOGRAPHY N/A 05/27/2022   Procedure: RIGHT/LEFT HEART CATH AND CORONARY/GRAFT ANGIOGRAPHY;  Surgeon: [name], MD;  Location: [facility] INVASIVE CV LAB;  Service: Cardiovascular;  Laterality: N/A;   TONSILLECTOMY AND ADENOIDECTOMY     TRANSURETHRAL RESECTION OF PROSTATE  1990   Patient Active Problem List   Diagnosis Date Noted   Muscle weakness 11/19/2022   Statins contraindicated 10/16/2022   Paroxysmal atrial fibrillation (HCC) 08/27/2022   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) 08/27/2022   ICD (implantable cardioverter-defibrillator) in place 08/05/2022   Statin-induced rhabdomyolysis 07/05/2022  Transaminitis 07/05/2022   Leukocytosis 07/05/2022   Chronic low back pain 07/02/2022   Dyspnea on exertion 05/22/2022   Chronic HFrEF (heart failure with reduced ejection fraction) (HCC) 12/13/2021   Acute on chronic heart failure with preserved ejection fraction (HFpEF) (HCC) 10/08/2020   Non-ST elevation (NSTEMI) myocardial infarction (HCC) 10/08/2020   CHF (congestive heart failure) (HCC) 10/04/2020   Hx of CABG    Accelerating angina (HCC) 10/03/2020   Rotator cuff disorder 08/09/2019   BPH associated with nocturia  11/29/2018   Status post lumbar spine surgery for decompression of spinal cord 10/29/2018   Spinal stenosis of lumbar region 08/02/2018   Atypical mole 07/30/2017   Wheezing 07/21/2017   Sinus bradycardia 03/18/2017   Ventral hernia without obstruction or gangrene 02/25/2017   (HFpEF) heart failure with preserved ejection fraction (HCC) 09/28/2016   Ischemic cardiomyopathy 09/28/2016   Vitamin D deficiency 06/27/2016   Vitamin B deficiency 06/27/2016   Type 2 diabetes mellitus without complication, without long-term current use of insulin (HCC) 05/20/2016   Essential hypertension 05/20/2016   Hyperlipidemia associated with type 2 diabetes mellitus (HCC) 05/20/2016   Coronary artery disease of native artery of native heart with stable angina pectoris (HCC) 05/20/2016    PCP: [name], PA   REFERRING PROVIDER: [name], PA   REFERRING DIAG: R53.1 (ICD-10-CM) - Weakness M25.60 (ICD-10-CM) - Decreased range of motion  Rationale for Evaluation and Treatment: Rehabilitation  THERAPY DIAG:  Other abnormalities of gait and mobility  Muscle weakness (generalized)  Physical deconditioning  ONSET DATE: 07/10/22  SUBJECTIVE:                                                                                                                                                                                           SUBJECTIVE STATEMENT:  Feels he is ready for independent management.  Any residual deficits are felt to be due to rhabdomyolysis.  PERTINENT HISTORY:  Patient had statin induced rhabdomyolysis, myalgia has resolved but he continues to experience weakness, tightness, limited ROM specifically in legs and lower back, shoulders and neck.  PAIN:  Are you having pain? No  PRECAUTIONS: Fall  WEIGHT BEARING RESTRICTIONS: No  FALLS:  Has patient fallen in last 6 months? Yes. Number of falls 1  OCCUPATION: retired  PLOF: Independent  PATIENT GOALS: To regain my LE  strength and mobility  NEXT MD VISIT: PRN  OBJECTIVE:   DIAGNOSTIC FINDINGS:  none  PATIENT SURVEYS:  FOTO 49(64 predicted) 11/11/22: 45% 12/17/22 60% 01/06/23 63%  SCREENING FOR RED FLAGS: Bowel or bladder incontinence: No  MUSCLE LENGTH: N/T  POSTURE: rounded shoulders, forward  head, and decreased lumbar lordosis   LUMBAR ROM: deferred  AROM eval  Flexion   Extension   Right lateral flexion   Left lateral flexion   Right rotation   Left rotation    (Blank rows = not tested)  LOWER EXTREMITY ROM:   WFL for gait, bed mobility and transfers  Active  Right eval Left eval  Hip flexion    Hip extension    Hip abduction    Hip adduction    Hip internal rotation    Hip external rotation    Knee flexion    Knee extension    Ankle dorsiflexion    Ankle plantarflexion    Ankle inversion    Ankle eversion     (Blank rows = not tested)  LOWER EXTREMITY MMT:    MMT Right eval Left eval  Hip flexion 3+ 4-  Hip extension 3+ 4-  Hip abduction 3+ 4-  Hip adduction    Hip internal rotation    Hip external rotation    Knee flexion 3+ 4-  Knee extension 3+ 4-  Ankle dorsiflexion    Ankle plantarflexion 3+ 4-  Ankle inversion    Ankle eversion     (Blank rows = not tested)  LUMBAR SPECIAL TESTS:  Slump test: Negative  FUNCTIONAL TESTS:  5 times sit to stand: 27s with UE Support 2 MWT with cane 229ft  GAIT: Distance walked: 72ftx2 Assistive device utilized: Single point cane Level of assistance: Complete Independence Comments: slow cadence  TODAY'S TREATMENT:     [facility] Adult PT Treatment:                                                DATE: 01/06/23 Therapeutic Exercise: 2 MWT 350ft with cane 5x STS 10s arms crossed HEP review and update FOTO retake  [facility] Adult PT Treatment:                                                DATE: 12/24/22 Therapeutic Exercise: Nustep L5 x 8 min R hip flexor 30s x2 Supine QL stretch 30s x2 B  Supine curl up with  p-ball 15x B, 15/15 unilaterally Supine clamshell BluTB 15x B, 15/15 unilateral S/L clams BluTB 15/15 Bridge against BlaTB 15x Runners step 6 in 10/10  [facility] Adult PT Treatment:                                                DATE: 12/17/22 Therapeutic Exercise: Nustep L4 x 8 min R hip flexor 30s x2 Supine QL stretch 30s x2 B  Supine curl up with p-ball 15x B, 15/15 unilaterally Supine clamshell BluTB 15x B, 15/15 unilateral S/L clams BluTB 15/15 A/P WS on 6 in step 10/10 with OH reach for balance Lateral WS onto 6 in step 10/10  Therapeutic Activity: FOTO retake Assessment of goals/progress to date   PATIENT EDUCATION:  Education details: Discussed eval findings, rehab rationale and POC and patient is in agreement  Person educated: Patient Education method: Explanation Education comprehension: verbalized understanding and needs further education  HOME EXERCISE PROGRAM:  Access Code: [email or web address] URL: [email or web address] Date: 01/06/2023 Prepared by: Gustavus Bryant  Exercises - Sit to Stand with Armchair  - 2 x daily - 5 x weekly - 1 sets - 5 reps - Sidelying Open Book Thoracic Lumbar Rotation and Extension  - 2 x daily - 5 x weekly - 1 sets - 10 reps - Hooklying Single Leg Bent Knee Fallouts with Resistance  - 1 x daily - 5 x weekly - 3 sets - 10 reps - 30s hold - Standing Bilateral Heel Raise on Step  - 1 x daily - 5 x weekly - 1 sets - 15 reps  ASSESSMENT:  CLINICAL IMPRESSION: Rehab goals met patient ready for independent management  OBJECTIVE IMPAIRMENTS: Abnormal gait, decreased activity tolerance, decreased balance, decreased coordination, decreased endurance, decreased mobility, difficulty walking, decreased strength, and postural dysfunction.   ACTIVITY LIMITATIONS: carrying, lifting, standing, squatting, stairs, transfers, and bed mobility  PERSONAL FACTORS: Age, Past/current experiences, and Time since onset of injury/illness/exacerbation are also  affecting patient's functional outcome.   REHAB POTENTIAL: Good  CLINICAL DECISION MAKING: Evolving/moderate complexity  EVALUATION COMPLEXITY: Low   GOALS: Goals reviewed with patient? No  SHORT TERM GOALS: Target date: 10/21/2022  Patient to demonstrate independence in HEP Baseline: [email or web address] Goal status: MET  2.  500 ft ambulation with SPC  Baseline: 245ft w/cane; 11/04/22 553ft w/cane Goal status: Met  LONG TERM GOALS: Target date: 12/08/22  Decrease 5x STS time to 20s arms crossed Baseline: 27s arms used; 11/19/22 13s arms crossed; 01/06/23 10s Goal status: Met  2.  Increase distance on 2 MWT to 340ft w/cane Baseline: 236ft w/cane; 11/19/22 242ft w/cane; 01/06/23 367ft Goal status: Met  3.  Increase BLE strength to 4/5 Baseline:  MMT Right eval Left eval B  01/06/23  Hip flexion 3+ 4- 4  Hip extension 3+ 4- 4  Hip abduction 3+ 4- 4  Hip adduction     Hip internal rotation     Hip external rotation     Knee flexion 3+ 4- 4  Knee extension 3+ 4- 4  Ankle dorsiflexion     Ankle plantarflexion 3+ 4- 4   Goal status: Met  4.  Increase FOTO score to 64 Baseline: 49; 11/14/22 45; 12/17/22 60; 01/06/23 63% Goal status: Met   PLAN:  PT FREQUENCY: 1x/week  PT DURATION: 4 weeks (beginning 12/03/22)  PLANNED INTERVENTIONS: Therapeutic exercises, Therapeutic activity, Neuromuscular re-education, Balance training, Gait training, Patient/Family education, Self Care, Joint mobilization, Aquatic Therapy, Dry Needling, Electrical stimulation, Cryotherapy, Moist heat, Manual therapy, and Re-evaluation.  PLAN FOR NEXT SESSION: HEP review and update, manual techniques as appropriate, aerobic tasks, ROM and flexibility activities, strengthening and PREs, TPDN, gait and balance training as needed     Hildred Laser, PT 01/06/2023, 1:49 PM

## 2023-01-06 ENCOUNTER — Ambulatory Visit: Payer: Medicare HMO | Attending: Family Medicine

## 2023-01-06 ENCOUNTER — Encounter: Payer: Self-pay | Admitting: Cardiology

## 2023-01-06 DIAGNOSIS — R5381 Other malaise: Secondary | ICD-10-CM | POA: Diagnosis not present

## 2023-01-06 DIAGNOSIS — R2689 Other abnormalities of gait and mobility: Secondary | ICD-10-CM | POA: Insufficient documentation

## 2023-01-06 DIAGNOSIS — M6281 Muscle weakness (generalized): Secondary | ICD-10-CM | POA: Diagnosis not present

## 2023-01-06 NOTE — Progress Notes (Signed)
PERIOPERATIVE PRESCRIPTION FOR IMPLANTED CARDIAC DEVICE PROGRAMMING  Patient Information: Name:  Ravon Mortellaro  DOB:  11/17/1940  MRN:  [id number]    Planned Procedure:  Cystoscopy with urethral dilation.  Surgeon:  Dr. Rhoderick Moody  Date of Procedure:  01/13/23  Cautery will be used.  Position during surgery:    Please send documentation back to:  [facility] [phone] # [phone])   Device Information:  Clinic EP Physician:  Dr. Steffanie Dunn   Device Type:  Defibrillator Manufacturer and Phone #:  [employer]: [phone] Pacemaker Dependent?:  No. Date of Last Device Check:  12/30/22 Normal Device Function?:  Yes.    Electrophysiologist's Recommendations:  Have magnet available. Provide continuous ECG monitoring when magnet is used or reprogramming is to be performed.  Procedure should not interfere with device function.  No device programming or magnet placement needed.  Per Device Clinic Standing Orders, Lenor Coffin, RN  1:16 PM 01/06/2023

## 2023-01-06 NOTE — Patient Instructions (Addendum)
DUE TO COVID-19 ONLY TWO VISITORS  (aged 80 and older)  ARE ALLOWED TO COME WITH YOU AND STAY IN THE WAITING ROOM ONLY DURING PRE OP AND PROCEDURE.   **NO VISITORS ARE ALLOWED IN THE SHORT STAY AREA OR RECOVERY ROOM!!**  IF YOU WILL BE ADMITTED INTO THE HOSPITAL YOU ARE ALLOWED ONLY FOUR SUPPORT PEOPLE DURING VISITATION HOURS ONLY (7 AM -8PM)   The support person(s) must pass our screening, gel in and out, and wear a mask at all times, including in the patient's room. Patients must also wear a mask when staff or their support person are in the room. Visitors GUEST BADGE MUST BE WORN VISIBLY  One adult visitor may remain with you overnight and MUST be in the room by 8 P.M.     Your procedure is scheduled on: 01/13/23   Report to [facility] Main Entrance    Report to admitting at : 6:15 AM   Call this number if you have problems the morning of surgery [phone]   Do not eat food or drink : After Midnight.  FOLLOW ANY ADDITIONAL PRE OP INSTRUCTIONS YOU RECEIVED FROM YOUR SURGEON'S OFFICE!!!   Oral Hygiene is also important to reduce your risk of infection.                                    Remember - BRUSH YOUR TEETH THE MORNING OF SURGERY WITH YOUR REGULAR TOOTHPASTE  DENTURES WILL BE REMOVED PRIOR TO SURGERY PLEASE DO NOT APPLY "Poly grip" OR ADHESIVES!!!   Do NOT smoke after Midnight   Take these medicines the morning of surgery with A SIP OF WATER: Isosorbide,ranolazine,carvedilol,finasteride,tamsulosin.Tylenol as needed. How to Manage Your Diabetes Before and After Surgery  Why is it important to control my blood sugar before and after surgery? Improving blood sugar levels before and after surgery helps healing and can limit problems. A way of improving blood sugar control is eating a healthy diet by:  Eating less sugar and carbohydrates  Increasing activity/exercise  Talking with your doctor about reaching your blood sugar goals High blood sugars (greater than  180 mg/dL) can raise your risk of infections and slow your recovery, so you will need to focus on controlling your diabetes during the weeks before surgery. Make sure that the doctor who takes care of your diabetes knows about your planned surgery including the date and location.  How do I manage my blood sugar before surgery? Check your blood sugar at least 4 times a day, starting 2 days before surgery, to make sure that the level is not too high or low. Check your blood sugar the morning of your surgery when you wake up and every 2 hours until you get to the Short Stay unit. If your blood sugar is less than 70 mg/dL, you will need to treat for low blood sugar: Do not take insulin. Treat a low blood sugar (less than 70 mg/dL) with  cup of clear juice (cranberry or apple), 4 glucose tablets, OR glucose gel. Recheck blood sugar in 15 minutes after treatment (to make sure it is greater than 70 mg/dL). If your blood sugar is not greater than 70 mg/dL on recheck, call [phone] for further instructions. Report your blood sugar to the short stay nurse when you get to Short Stay.  If you are admitted to the hospital after surgery: Your blood sugar will be checked by the  staff and you will probably be given insulin after surgery (instead of oral diabetes medicines) to make sure you have good blood sugar levels. The goal for blood sugar control after surgery is 80-180 mg/dL.   WHAT DO I DO ABOUT MY DIABETES MEDICATION?  HOLD Jardiance after: 01/09/23     THE MORNING OF SURGERY, DO NOT TAKE ANY ORAL DIABETIC MEDICATIONS DAY OF YOUR SURGERY                              You may not have any metal on your body including hair pins, jewelry, and body piercing             Do not wear lotions, powders, perfumes/cologne, or deodorant              Men may shave face and neck.   Do not bring valuables to the hospital. [facility] IS NOT             RESPONSIBLE   FOR VALUABLES.   Contacts, glasses, or  bridgework may not be worn into surgery.   Bring small overnight bag day of surgery.   DO NOT BRING YOUR HOME MEDICATIONS TO THE HOSPITAL. PHARMACY WILL DISPENSE MEDICATIONS LISTED ON YOUR MEDICATION LIST TO YOU DURING YOUR ADMISSION IN THE HOSPITAL!    Patients discharged on the day of surgery will not be allowed to drive home.  Someone NEEDS to stay with you for the first 24 hours after anesthesia.   Special Instructions: Bring a copy of your healthcare power of attorney and living will documents         the day of surgery if you haven't scanned them before.              Please read over the following fact sheets you were given: IF YOU HAVE QUESTIONS ABOUT YOUR PRE-OP INSTRUCTIONS PLEASE CALL [phone]    [facility] - Preparing for Surgery Before surgery, you can play an important role.  Because skin is not sterile, your skin needs to be as free of germs as possible.  You can reduce the number of germs on your skin by washing with CHG (chlorahexidine gluconate) soap before surgery.  CHG is an antiseptic cleaner which kills germs and bonds with the skin to continue killing germs even after washing. Please DO NOT use if you have an allergy to CHG or antibacterial soaps.  If your skin becomes reddened/irritated stop using the CHG and inform your nurse when you arrive at Short Stay. Do not shave (including legs and underarms) for at least 48 hours prior to the first CHG shower.  You may shave your face/neck. Please follow these instructions carefully:  1.  Shower with CHG Soap the night before surgery and the  morning of Surgery.  2.  If you choose to wash your hair, wash your hair first as usual with your  normal  shampoo.  3.  After you shampoo, rinse your hair and body thoroughly to remove the  shampoo.                           4.  Use CHG as you would any other liquid soap.  You can apply chg directly  to the skin and wash                       Gently with  a scrungie or clean  washcloth.  5.  Apply the CHG Soap to your body ONLY FROM THE NECK DOWN.   Do not use on face/ open                           Wound or open sores. Avoid contact with eyes, ears mouth and genitals (private parts).                       Wash face,  Genitals (private parts) with your normal soap.             6.  Wash thoroughly, paying special attention to the area where your surgery  will be performed.  7.  Thoroughly rinse your body with warm water from the neck down.  8.  DO NOT shower/wash with your normal soap after using and rinsing off  the CHG Soap.                9.  Pat yourself dry with a clean towel.            10.  Wear clean pajamas.            11.  Place clean sheets on your bed the night of your first shower and do not  sleep with pets. Day of Surgery : Do not apply any lotions/deodorants the morning of surgery.  Please wear clean clothes to the hospital/surgery center.  FAILURE TO FOLLOW THESE INSTRUCTIONS MAY RESULT IN THE CANCELLATION OF YOUR SURGERY PATIENT SIGNATURE_________________________________  NURSE SIGNATURE__________________________________  ________________________________________________________________________

## 2023-01-07 ENCOUNTER — Encounter (HOSPITAL_COMMUNITY): Payer: Self-pay

## 2023-01-07 ENCOUNTER — Encounter (HOSPITAL_COMMUNITY)
Admission: RE | Admit: 2023-01-07 | Discharge: 2023-01-07 | Disposition: A | Discharge status: Home / Self Care | Payer: Medicare HMO | Source: Ambulatory Visit | Attending: Urology | Admitting: Urology

## 2023-01-07 ENCOUNTER — Other Ambulatory Visit: Payer: Self-pay

## 2023-01-07 VITALS — BP 110/71 | HR 63 | Temp 98.8°F | Ht 67.0 in | Wt 159.0 lb

## 2023-01-07 DIAGNOSIS — Z951 Presence of aortocoronary bypass graft: Secondary | ICD-10-CM | POA: Diagnosis not present

## 2023-01-07 DIAGNOSIS — Z7901 Long term (current) use of anticoagulants: Secondary | ICD-10-CM | POA: Insufficient documentation

## 2023-01-07 DIAGNOSIS — E119 Type 2 diabetes mellitus without complications: Secondary | ICD-10-CM | POA: Insufficient documentation

## 2023-01-07 DIAGNOSIS — I10 Essential (primary) hypertension: Secondary | ICD-10-CM | POA: Insufficient documentation

## 2023-01-07 DIAGNOSIS — Z01818 Encounter for other preprocedural examination: Secondary | ICD-10-CM | POA: Diagnosis not present

## 2023-01-07 DIAGNOSIS — Z01812 Encounter for preprocedural laboratory examination: Secondary | ICD-10-CM | POA: Insufficient documentation

## 2023-01-07 DIAGNOSIS — I2510 Atherosclerotic heart disease of native coronary artery without angina pectoris: Secondary | ICD-10-CM | POA: Insufficient documentation

## 2023-01-07 DIAGNOSIS — I252 Old myocardial infarction: Secondary | ICD-10-CM | POA: Insufficient documentation

## 2023-01-07 HISTORY — DX: Cardiac murmur, unspecified: R01.1

## 2023-01-07 HISTORY — DX: Acute myocardial infarction, unspecified: I21.9

## 2023-01-07 HISTORY — DX: Other complications of anesthesia, initial encounter: T88.59XA

## 2023-01-07 HISTORY — DX: Cardiac arrhythmia, unspecified: I49.9

## 2023-01-07 LAB — CBC
HCT: 41.2 % (ref 39.0–52.0)
Hemoglobin: 14.1 g/dL (ref 13.0–17.0)
MCH: 32.4 pg (ref 26.0–34.0)
MCHC: 34.2 g/dL (ref 30.0–36.0)
MCV: 94.7 fL (ref 80.0–100.0)
Platelets: 202 10*3/uL (ref 150–400)
RBC: 4.35 MIL/uL (ref 4.22–5.81)
RDW: 13.9 % (ref 11.5–15.5)
WBC: 12.3 10*3/uL — ABNORMAL HIGH (ref 4.0–10.5)
nRBC: 0 % (ref 0.0–0.2)

## 2023-01-07 LAB — GLUCOSE, CAPILLARY: Glucose-Capillary: 117 mg/dL — ABNORMAL HIGH (ref 70–99)

## 2023-01-07 NOTE — Progress Notes (Signed)
For Short Stay: COVID SWAB appointment date:  Bowel Prep reminder:   For Anesthesia: PCP - [name], PA  Cardiologist - End, Cristal Deer, MD  [name], MD Electrophysiology   Chest x-ray - 05/25/22 EKG - 12/17/20 Stress Test -  ECHO - 04/21/22 Cardiac Cath - 10/04/25 Pacemaker/ICD device last checked:12/30/22 Pacemaker orders received: EPIC/Chart. Device Rep notified:  Spinal Cord Stimulator: N/A  Sleep Study - N/A CPAP -   Fasting Blood Sugar - 100's Checks Blood Sugar __4-5 times a week. Date and result of last Hgb A1c-5.5: 11/23/22  Last dose of GLP1 agonist- N/A GLP1 instructions:   Last dose of SGLT-2 inhibitors- Jardiance SGLT-2 instructions: Will continue.  Blood Thinner Instructions: Eliquis: Hold after: 01/10/23 Aspirin Instructions: will continue. Last Dose:  Activity level: Can go up a flight of stairs and activities of daily living without stopping and without chest pain and/or shortness of breath   Able to exercise without chest pain and/or shortness of breath  Anesthesia review: Hx: Heart failure,Heart attack,CAD,DIA,HTN,CABG.  Patient denies shortness of breath, fever, cough and chest pain at PAT appointment   Patient verbalized understanding of instructions that were given to them at the PAT appointment. Patient was also instructed that they will need to review over the PAT instructions again at home before surgery.

## 2023-01-08 NOTE — Anesthesia Preprocedure Evaluation (Addendum)
Anesthesia Evaluation  Patient identified by MRN, date of birth, ID band Patient awake    Reviewed: Allergy & Precautions, NPO status , Patient's Chart, lab work & pertinent test results  History of Anesthesia Complications (+) DIFFICULT AIRWAY and history of anesthetic complications  Airway Mallampati: III  TM Distance: >3 FB Neck ROM: Full    Dental  (+) Dental Advisory Given   Pulmonary neg pulmonary ROS   Pulmonary exam normal        Cardiovascular hypertension, Pt. on medications and Pt. on home beta blockers + CAD, + Past MI, + CABG and +CHF  Normal cardiovascular exam+ Cardiac Defibrillator    '24 Cath - 1.Severe multivessel coronary artery disease, including chronic total occlusions of mid LAD and proximal RCA.  There is moderate LMCA and LCx disease.  Overall appearance is similar to prior catheterization in 2020. 2.Known occlusions of all bypass grafts; occlusions of SVG-LAD and SVG-RCA redemonstrated today. 3.Normal left heart and right heart filling pressures. 4.Normal Fick cardiac output/index.  '24 TTE - EF 30 to 35%. Global hypokinesis. There is mild left ventricular hypertrophy of the basal-septal segment. The right ventricular size is mildly enlarged. Left atrial size was mildly dilated. Mild mitral valve regurgitation. Mild aortic valve stenosis. There is mild dilatation of the ascending aorta, measuring 39 mm.     Neuro/Psych negative neurological ROS  negative psych ROS   GI/Hepatic negative GI ROS, Neg liver ROS,,,  Endo/Other  diabetes, Type 2, Oral Hypoglycemic Agents    Renal/GU negative Renal ROS     Musculoskeletal  (+) Arthritis ,   Scoliosis    Abdominal   Peds  Hematology  On eliquis    Anesthesia Other Findings   Reproductive/Obstetrics                             Anesthesia Physical Anesthesia Plan  ASA: 4  Anesthesia Plan: General   Post-op Pain  Management: Tylenol PO (pre-op)*   Induction: Intravenous  PONV Risk Score and Plan: 2 and Treatment may vary due to age or medical condition and Ondansetron  Airway Management Planned: LMA  Additional Equipment: None  Intra-op Plan:   Post-operative Plan: Extubation in OR  Informed Consent: I have reviewed the patients History and Physical, chart, labs and discussed the procedure including the risks, benefits and alternatives for the proposed anesthesia with the patient or authorized representative who has indicated his/her understanding and acceptance.     Dental advisory given  Plan Discussed with: CRNA and Anesthesiologist  Anesthesia Plan Comments: (See PAT note)        Anesthesia Quick Evaluation

## 2023-01-08 NOTE — Progress Notes (Signed)
DISCUSSION: Jacob Harrison is a 82 yo male who presents to PAT prior to CYSTOSCOPY WITH URETHRAL DILATATION on 01/13/2023 with Dr. Liliane Shi. 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  Anestheisa complications include hx of difficult intubation. Per 10/20/2018 note: "Difficult Airway- due to reduced neck mobility, Difficult Airway- due to anterior larynx and Difficult Airway- due to immobile epiglottis. Used Glidescope and 4 (unsuccessful with Miller 3, Mac 3). Discussed difficult intubation and need for VideoGlide scope intubation with patient. He understands he will receive a letter for reference, should he require surgery in the future."  Patient follows with Cardiology for hx of CAD, HFpEF, ischemic CM. Last saw Dr. Okey Dupre on 12/18/22. He reported mild chest tightness and dyspnea with exercise but not with ADLs. He also had an episode ~1 month ago that woke him up and resolved with nitroglycerin. Per Dr. Okey Dupre: "Mr. Vuncannon has stable exertional symptoms and reports only a single episode of discomfort that woke him up a couple of weeks ago.  Recent ICD interrogation did not show any arrhythmias during that.  We have agreed to defer additional testing and medication changes at this time.  Given his severe CAD, any attempted intervention would be very high risk.  We will therefore defer repeat catheterization for refractory symptoms.  EKG shows acceptable QTc today with addition of afluzosin.  We will continue current dose of ranolazine as well as current regimen of carvedilol and isosorbide mononitrate." He is on GDMT for his CHF.  He was cleared for surgery on 01/02/23: "Apixaban can be held for 2 days prior to cystoscopy. Given history of extensive CAD and CABG/redo CABG, I recommend that aspirin 81 mg daily be continued throughout the perioperative period."   Device orders in note from  01/06/23:  Electrophysiologist's Recommendations:   Have magnet available. Provide continuous ECG monitoring when magnet is used or reprogramming is to be performed.  Procedure should not interfere with device function.  No device programming or magnet placement needed.  Patient started seeing Pulmonology in March 2024 after his Cardiologist ordered a Chest CT which showed lung scarring. Last seen by Pulm on 08/22/22. Breathing noted to be "fine" at that visit. No SOB, wheezing, cough noted.  Cough resolved with GERD tx. Advised to have PFTs and HRCT done to complete work up. Advised f/u prn.  VS: BP 110/71   Pulse 63   Temp 37.1 C (Oral)   Ht 5\' 7"  (1.702 m)   Wt 72.1 kg   SpO2 97%   BMI 24.90 kg/m   PROVIDERS: [name], PA Cardiologist:  [name], MD Electrophysiologist:  [name], MD Pulmonology: Sherene Sires, Md  LABS: Labs reviewed: Acceptable for surgery. (all labs ordered are listed, but only abnormal results are displayed)  Labs Reviewed  CBC - Abnormal; Notable for the following components:      Result Value   WBC 12.3 (*)    All other components within normal limits  GLUCOSE, CAPILLARY - Abnormal; Notable for the following components:   Glucose-Capillary 117 (*)    All other components within normal limits     IMAGES:  CT Chest 06/03/2022:  IMPRESSION: Postop chest with extensive vascular calcifications along the coronary arteries. There also bypass grafts. These bypass gastric somewhat poorly opacified on this examination. Please correlate with patient's coronary history. Defibrillator.   Left hemithorax volume loss with pleural  thickening and pleural calcifications without effusion.   Bilateral lung areas of interstitial septal thickening, scarring fibrotic changes as well as some ground-glass particularly along the lower lung zones. If there is concern of interstitial lung disease a true dedicated high-resolution study can be  performed as clinically appropriate.   Evidence of old granulomatous disease.   Small hiatal hernia.   Aortic Atherosclerosis (ICD10-I70.0).   EKG 12/18/22:  Sinus bradycardia with First degree A-V block  Inferior infarct Anterolateral infarct When compared with ECG of 15-Sep-2022 PR interval has increased Otherwise no significant change     CV:  Device check 12/17/22:  Device interrogation reviewed. Lead parameters stable. Continue remote monitoring.  Left/Right heart cath 05/27/2022:  Conclusions: Severe multivessel coronary artery disease, including chronic total occlusions of mid LAD and proximal RCA.  There is moderate LMCA and LCx disease.  Overall appearance is similar to prior catheterization in 2020. Known occlusions of all bypass grafts; occlusions of SVG-LAD and SVG-RCA redemonstrated today. Normal left heart and right heart filling pressures. Normal Fick cardiac output/index.   Recommendations: Decease carvedilol to 6.25 mg daily given borderline low BP, bradycardia, and fatigue. Repeat CBC with diff to reassess leukocytosis. Consider CT chest for further evaluation of left lung base opacity on recent CXR and dyspnea. Aggressive secondary prevention of coronary artery disease.   Echo 04/21/2022:  IMPRESSIONS     1. Left ventricular ejection fraction, by estimation, is 30 to 35%. The  left ventricle has moderate to severely decreased function. The left  ventricle demonstrates global hypokinesis. There is mild left ventricular  hypertrophy of the basal-septal  segment. Left ventricular diastolic parameters are indeterminate.   2. Right ventricular systolic function is low normal. The right  ventricular size is mildly enlarged.   3. Left atrial size was mildly dilated.   4. The mitral valve is normal in structure. Mild mitral valve  regurgitation.   5. The aortic valve is tricuspid. Aortic valve regurgitation is not  visualized. Mild aortic valve stenosis.  Aortic valve mean gradient  measures 11.0 mmHg.   6. Aortic dilatation noted. There is mild dilatation of the ascending  aorta, measuring 39 mm.   7. The inferior vena cava is normal in size with greater than 50%  respiratory variability, suggesting right atrial pressure of 3 mmHg.   Comparison(s): Limited TTE (02/20/2021): LVEF 30-35%. Mild mitral  regurgitation. Aortic sclerosis without stenosis.   Past Medical History:  Diagnosis Date   (HFpEF) heart failure with preserved ejection fraction (HCC)    a. 10/2016 Echo: EF 65-70%, mild LVH, mildly dil LA. RV fxn nl.   Arthritis    BPH (benign prostatic hyperplasia)    Complication of anesthesia    dificult entubation,narrow   Coronary artery disease    a. 2001 CABG x 3 [facility]): LIMA->LAD, VG->LCX, VG->RPDA; b. 04/05/2010 Cath ([facility]): LM 43m, LAD 199m, LCX 66m, RCA 100p, LIMA->LAD atretic, VG->LCX 100, VG->RCA 80ost, 70d; c. 04/2010 Redo CABG x 2 ([facility]): VG->LAD, VG->RPDA; d. 10/2012 Cath [facility]): LM 20-30, LAD 100/95-4m, LCX 40-45m, RCA 100p, 80-100d, RPDA 99, RPL1 95, VG->LAD ok, VG->RPDA ok, EF 67%; e. 11/2016 MV: EF 48%, No ischemia.   Diabetes mellitus without complication (HCC)    Type II   Dysrhythmia    Heart murmur    Hyperlipidemia    Hypertension    Ischemic cardiomyopathy    a. 03/2021 s/p MDT Visia AF MRI VR SureScan single lead AICD (ser# [id number]).   Myocardial infarction (HCC)  Scoliosis    Spinal stenosis     Past Surgical History:  Procedure Laterality Date   BACK SURGERY  2020   laminectomy L4 L5   CARDIAC CATHETERIZATION     CATARACT EXTRACTION, BILATERAL  10/2022   CORONARY ARTERY BYPASS GRAFT     x 2 (2001 and redo in late 2011 or early 2012)   HERNIA REPAIR Left    inguinal   ICD IMPLANT N/A 03/20/2021   Procedure: ICD IMPLANT;  Surgeon: [name], MD;  Location: [facility] INVASIVE CV LAB;  Service: Cardiovascular;  Laterality: N/A;   LEFT HEART CATH AND CORS/GRAFTS ANGIOGRAPHY N/A  10/04/2020   Procedure: LEFT HEART CATH AND CORS/GRAFTS ANGIOGRAPHY;  Surgeon: [name], MD;  Location: [facility] INVASIVE CV LAB;  Service: Cardiovascular;  Laterality: N/A;   LUMBAR LAMINECTOMY/DECOMPRESSION MICRODISCECTOMY N/A 10/20/2018   Procedure: L3-4, L4-5 LUMBAR DECOMPRESSION with dural repair.;  Surgeon: [name], MD;  Location: [facility] OR;  Service: Orthopedics;  Laterality: N/A;   RIGHT/LEFT HEART CATH AND CORONARY/GRAFT ANGIOGRAPHY N/A 05/27/2022   Procedure: RIGHT/LEFT HEART CATH AND CORONARY/GRAFT ANGIOGRAPHY;  Surgeon: [name], MD;  Location: [facility] INVASIVE CV LAB;  Service: Cardiovascular;  Laterality: N/A;   TONSILLECTOMY AND ADENOIDECTOMY     TRANSURETHRAL RESECTION OF PROSTATE  1990    MEDICATIONS:  acetaminophen (TYLENOL) 500 MG tablet   apixaban (ELIQUIS) 5 MG TABS tablet   aspirin EC 81 MG tablet   carvedilol (COREG) 12.5 MG tablet   Cholecalciferol (VITAMIN D-3 PO)   cyanocobalamin 500 MCG tablet   Evolocumab (REPATHA SURECLICK) 140 MG/ML SOAJ   finasteride (PROSCAR) 5 MG tablet   furosemide (LASIX) 40 MG tablet   Glucosamine-Chondroit-Vit C-Mn (GLUCOSAMINE 1500 COMPLEX) CAPS   isosorbide mononitrate (IMDUR) 30 MG 24 hr tablet   JARDIANCE 10 MG TABS tablet   Lancets (ONETOUCH ULTRASOFT) lancets   losartan (COZAAR) 25 MG tablet   Multiple Vitamins-Minerals (PRESERVISION AREDS 2 PO)   nitroGLYCERIN (NITROSTAT) 0.4 MG SL tablet   Polyethyl Glycol-Propyl Glycol (LUBRICANT EYE DROPS) 0.4-0.3 % SOLN   ranolazine (RANEXA) 1000 MG SR tablet   tamsulosin (FLOMAX) 0.4 MG CAPS capsule   triamcinolone cream (KENALOG) 0.1 %   No current facility-administered medications for this encounter.   Marcille Blanco [facility] Surgical Short Stay/Anesthesiology [facility] Phone [phone] 01/08/2023 11:45 AM

## 2023-01-12 NOTE — H&P (Signed)
Ventral hernia without obstruction or gangrene  Wheezing  Atypical mole  Spinal stenosis of lumbar region  Status post lumbar spine surgery for decompression of spinal cord  Hx of CABG  Dyspnea on exertion  Chronic low back pain  Transaminitis  Leukocytosis  ICD (implantable cardioverter-defibrillator) in place  Statins contraindicated  Muscle weakness     NON-GU PMH: Arrhythmia Atrial Fibrillation Diabetes Type 2 Hypercholesterolemia Hypertension Myocardial Infarction    FAMILY HISTORY: Atrial Fibrillation - Uncle, Brother Colon Cancer - Uncle, Father Deceased - Father, Mother Diabetes - Mother, Uncle   SOCIAL HISTORY: Marital Status: Married Preferred Language: English; Ethnicity: Not Hispanic Or Latino; Race: White Current Smoking Status: Patient has never smoked.   Tobacco Use Assessment Completed: Used Tobacco in last 30 days? Has never drank.  Drinks 3 caffeinated drinks per day. Patient's occupation is/was Retired.    REVIEW OF SYSTEMS:    GU Review Male:   Patient denies frequent urination, hard to postpone urination, burning/ pain with urination, get up at night to urinate, leakage of urine, stream starts and stops, trouble starting your stream, have to strain to urinate , erection problems, and penile pain.  Gastrointestinal (Upper):   Patient denies nausea, vomiting, and indigestion/ heartburn.  Gastrointestinal (Lower):   Patient denies diarrhea and constipation.  Constitutional:   Patient denies fever, night sweats, weight loss, and fatigue.  Skin:   Patient denies skin rash/ lesion and itching.  Eyes:   Patient denies blurred vision and double vision.  Ears/ Nose/ Throat:   Patient denies sore throat and sinus problems.  Hematologic/Lymphatic:   Patient denies easy bruising and swollen glands.  Cardiovascular:   Patient denies leg swelling and chest pains.  Respiratory:   Patient denies cough and shortness of breath.  Endocrine:   Patient denies  excessive thirst.  Musculoskeletal:   Patient denies back pain and joint pain.  Neurological:   Patient denies headaches and dizziness.  Psychologic:   Patient denies depression and anxiety.   VITAL SIGNS: None   MULTI-SYSTEM PHYSICAL EXAMINATION:    Constitutional: Well-nourished. No physical deformities. Normally developed. Good grooming.  Neurologic / Psychiatric: Oriented to time, oriented to place, oriented to person. No depression, no anxiety, no agitation.     Complexity of Data:  Records Review:   Previous Patient Records  X-Ray Review: C.T. Hematuria: Reviewed Films. Reviewed Report. Discussed With Patient.     11/21/22  PSA  Total PSA 0.18 ng/mL   Notes:                     CLINICAL DATA: Microhematuria   EXAM:  CT ABDOMEN AND PELVIS WITHOUT AND WITH CONTRAST   TECHNIQUE:  Multidetector CT imaging of the abdomen and pelvis was performed  following the standard protocol before and following the bolus  administration of intravenous contrast.   RADIATION DOSE REDUCTION: This exam was performed according to the  departmental dose-optimization program which includes automated  exposure control, adjustment of the mA and/or kV according to  patient size and/or use of iterative reconstruction technique.   CONTRAST: 125 mL Omnipaque 300 IV   COMPARISON: None Available.   FINDINGS:  Lower chest: Subpleural reticulation/fibrosis in the visualized lung  bases.   Hepatobiliary: Liver is within normal limits.   Gallbladder is unremarkable. No intrahepatic or extrahepatic ductal  dilatation.   Pancreas: Within normal limits.   Spleen: Within normal limits.   Adrenals/Urinary Tract: Adrenal glands are within normal limits.   Kidneys are  within normal limits.   2 mm nonobstructing right lower pole renal calculus (series 2/image  42). No ureteral or bladder calculi. No hydronephrosis.   On delayed imaging, there are no filling defects in the bilateral  opacified  proximal collecting systems, ureters, or bladder.   Bladder is within normal limits.   Stomach/Bowel: Stomach is notable for a tiny hiatal hernia.   No evidence of bowel obstruction.   Normal appendix (series 5/image 52).   Left colonic diverticulosis, without evidence of diverticulitis.   Vascular/Lymphatic: No evidence of abdominal aortic aneurysm.   Atherosclerotic calcifications of the abdominal aorta and branch  vessels, although vessels remain patent.   No suspicious abdominopelvic lymphadenopathy.   Reproductive: Prostate is unremarkable.   Other: No abdominopelvic ascites.   Musculoskeletal: Degenerative changes of the visualized  thoracolumbar spine. Median sternotomy.   IMPRESSION:  2 mm nonobstructing right lower pole renal calculus. No ureteral or  bladder calculi. No hydronephrosis.   Left colonic diverticulosis, without evidence of diverticulitis.    Electronically Signed  By: Charline Bills M.D.  On: 12/08/2022 03:26    PROCEDURES:         Flexible Cystoscopy - 52000  Risks, benefits, and some of the potential complications of the procedure were discussed at length with the patient including infection, bleeding, voiding discomfort, urinary retention, fever, chills, sepsis, and others. All questions were answered. Informed consent was obtained. Antibiotic prophylaxis was given. Sterile technique and intraurethral analgesia were used.  Meatus:  Normal size. Normal location. Normal condition.  Urethra:  Moderate penile stricture.      The lower urinary tract was carefully examined. The procedure was well-tolerated and without complications. Antibiotic instructions were given. Instructions were given to call the office immediately for bloody urine, difficulty urinating, urinary retention, painful or frequent urination, fever, chills, nausea, vomiting or other illness. The patient stated that he understood these instructions and would comply with them.          Urinalysis w/Scope Dipstick Dipstick Cont'd Micro  Color: Yellow Bilirubin: Neg mg/dL WBC/hpf: 0 - 5/hpf  Appearance: Clear Ketones: Neg mg/dL RBC/hpf: 0 - 2/hpf  Specific Gravity: 1.015 Blood: Neg ery/uL Bacteria: Rare (0-9/hpf)  pH: <=5.0 Protein: Neg mg/dL Cystals: NS (Not Seen)  Glucose: 3+ mg/dL Urobilinogen: 0.2 mg/dL Casts: NS (Not Seen)    Nitrites: Neg Trichomonas: Not Present    Leukocyte Esterase: Trace leu/uL Mucous: Not Present      Epithelial Cells: 0 - 5/hpf      Yeast: NS (Not Seen)      Sperm: Not Present    ASSESSMENT:      ICD-10 Details  1 GU:   Renal calculus - N20.0 Undiagnosed New Problem  2 NON-GU:   Other anterior urethral stricture, 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   [place], [place] [address]  Phone:  [phone]  Fax:  [phone]            Schedule Return Visit/Planned Activity: Next Available Appointment - Schedule Surgery          Document Letter(s):  Created for Patient: Clinical Summary         Notes:   -CT results discussed with the patient. Continue to monitor right renal stone.  -  Cystoscopy today revealed a thin penile urethral stricture  -Cystoscopy with urethral dilation with Optilume and other indicated procedures was discussed with the patient. He voices understanding and wishes to proceed.

## 2023-01-13 ENCOUNTER — Ambulatory Visit (HOSPITAL_COMMUNITY): Payer: Medicare HMO | Admitting: Medical

## 2023-01-13 ENCOUNTER — Other Ambulatory Visit: Payer: Self-pay

## 2023-01-13 ENCOUNTER — Other Ambulatory Visit (HOSPITAL_COMMUNITY): Payer: Self-pay

## 2023-01-13 ENCOUNTER — Ambulatory Visit (HOSPITAL_BASED_OUTPATIENT_CLINIC_OR_DEPARTMENT_OTHER): Payer: Self-pay | Admitting: Anesthesiology

## 2023-01-13 ENCOUNTER — Ambulatory Visit (HOSPITAL_COMMUNITY)
Admission: RE | Admit: 2023-01-13 | Discharge: 2023-01-13 | Disposition: A | Discharge status: Home / Self Care | Payer: Medicare HMO | Source: Ambulatory Visit | Attending: Urology | Admitting: Urology

## 2023-01-13 ENCOUNTER — Encounter (HOSPITAL_COMMUNITY): Payer: Self-pay | Admitting: Urology

## 2023-01-13 ENCOUNTER — Encounter (HOSPITAL_COMMUNITY)
Admission: RE | Disposition: A | Discharge status: Home / Self Care | Payer: Self-pay | Source: Ambulatory Visit | Attending: Urology

## 2023-01-13 DIAGNOSIS — N200 Calculus of kidney: Secondary | ICD-10-CM | POA: Diagnosis not present

## 2023-01-13 DIAGNOSIS — N35912 Unspecified bulbous urethral stricture, male: Secondary | ICD-10-CM | POA: Diagnosis not present

## 2023-01-13 DIAGNOSIS — N400 Enlarged prostate without lower urinary tract symptoms: Secondary | ICD-10-CM | POA: Insufficient documentation

## 2023-01-13 DIAGNOSIS — N35919 Unspecified urethral stricture, male, unspecified site: Secondary | ICD-10-CM | POA: Diagnosis not present

## 2023-01-13 DIAGNOSIS — Z8249 Family history of ischemic heart disease and other diseases of the circulatory system: Secondary | ICD-10-CM | POA: Insufficient documentation

## 2023-01-13 DIAGNOSIS — I480 Paroxysmal atrial fibrillation: Secondary | ICD-10-CM | POA: Insufficient documentation

## 2023-01-13 DIAGNOSIS — I110 Hypertensive heart disease with heart failure: Secondary | ICD-10-CM | POA: Insufficient documentation

## 2023-01-13 DIAGNOSIS — Z833 Family history of diabetes mellitus: Secondary | ICD-10-CM | POA: Insufficient documentation

## 2023-01-13 DIAGNOSIS — I5022 Chronic systolic (congestive) heart failure: Secondary | ICD-10-CM | POA: Insufficient documentation

## 2023-01-13 DIAGNOSIS — E1169 Type 2 diabetes mellitus with other specified complication: Secondary | ICD-10-CM | POA: Diagnosis not present

## 2023-01-13 DIAGNOSIS — E119 Type 2 diabetes mellitus without complications: Secondary | ICD-10-CM | POA: Diagnosis not present

## 2023-01-13 DIAGNOSIS — N35814 Other anterior urethral stricture, male: Secondary | ICD-10-CM | POA: Insufficient documentation

## 2023-01-13 DIAGNOSIS — I2510 Atherosclerotic heart disease of native coronary artery without angina pectoris: Secondary | ICD-10-CM

## 2023-01-13 HISTORY — PX: CYSTOSCOPY WITH URETHRAL DILATATION: SHX5125

## 2023-01-13 LAB — GLUCOSE, CAPILLARY: Glucose-Capillary: 117 mg/dL — ABNORMAL HIGH (ref 70–99)

## 2023-01-13 SURGERY — CYSTOSCOPY, WITH URETHRAL DILATION
Anesthesia: General

## 2023-01-13 MED ORDER — LIDOCAINE HCL (PF) 2 % IJ SOLN
INTRAMUSCULAR | Status: AC
  Filled 2023-01-13: qty 5

## 2023-01-13 MED ORDER — CHLORHEXIDINE GLUCONATE 0.12 % MT SOLN
15.0000 mL | Freq: Once | OROMUCOSAL | Status: AC
  Administered 2023-01-13: 15 mL via OROMUCOSAL

## 2023-01-13 MED ORDER — PHENYLEPHRINE HCL (PRESSORS) 10 MG/ML IV SOLN
INTRAVENOUS | Status: DC | PRN
  Administered 2023-01-13 (×2): 160 ug via INTRAVENOUS

## 2023-01-13 MED ORDER — CEFAZOLIN SODIUM-DEXTROSE 2-4 GM/100ML-% IV SOLN
2.0000 g | INTRAVENOUS | Status: AC
  Administered 2023-01-13: 2 g via INTRAVENOUS

## 2023-01-13 MED ORDER — OXYCODONE HCL 5 MG/5ML PO SOLN: 5.0000 mg | Freq: Once | ORAL | Status: DC | PRN

## 2023-01-13 MED ORDER — PROPOFOL 10 MG/ML IV BOLUS
INTRAVENOUS | Status: AC
  Filled 2023-01-13: qty 20

## 2023-01-13 MED ORDER — CEFAZOLIN SODIUM-DEXTROSE 2-4 GM/100ML-% IV SOLN
INTRAVENOUS | Status: AC
  Filled 2023-01-13: qty 100

## 2023-01-13 MED ORDER — OXYCODONE HCL 5 MG PO TABS: 5.0000 mg | ORAL_TABLET | Freq: Once | ORAL | Status: DC | PRN

## 2023-01-13 MED ORDER — INSULIN ASPART 100 UNIT/ML IJ SOLN: 0.0000 [IU] | INTRAMUSCULAR | Status: DC | PRN

## 2023-01-13 MED ORDER — VASOPRESSIN 20 UNIT/ML IV SOLN
INTRAVENOUS | Status: AC
  Filled 2023-01-13: qty 1

## 2023-01-13 MED ORDER — FENTANYL CITRATE PF 50 MCG/ML IJ SOSY: 25.0000 ug | PREFILLED_SYRINGE | INTRAMUSCULAR | Status: DC | PRN

## 2023-01-13 MED ORDER — EPHEDRINE SULFATE (PRESSORS) 50 MG/ML IJ SOLN
INTRAMUSCULAR | Status: DC | PRN
  Administered 2023-01-13 (×5): 5 mg via INTRAVENOUS

## 2023-01-13 MED ORDER — VASOPRESSIN 20 UNIT/ML IV SOLN
INTRAVENOUS | Status: DC | PRN
Start: 2023-01-13 — End: 2023-01-13
  Administered 2023-01-13: 1 [IU] via INTRAVENOUS

## 2023-01-13 MED ORDER — DEXAMETHASONE SODIUM PHOSPHATE 10 MG/ML IJ SOLN
INTRAMUSCULAR | Status: AC
  Filled 2023-01-13: qty 1

## 2023-01-13 MED ORDER — ONDANSETRON HCL 4 MG/2ML IJ SOLN: 4.0000 mg | Freq: Once | INTRAMUSCULAR | Status: DC | PRN

## 2023-01-13 MED ORDER — OXYBUTYNIN CHLORIDE 5 MG PO TABS
5.0000 mg | ORAL_TABLET | Freq: Three times a day (TID) | ORAL | 1 refills | Status: DC | PRN
Start: 2023-01-13 — End: 2023-07-15
  Filled 2023-01-13: qty 30, 10d supply, fill #0
  Filled 2023-01-29: qty 30, 10d supply, fill #1

## 2023-01-13 MED ORDER — PROPOFOL 10 MG/ML IV BOLUS
INTRAVENOUS | Status: DC | PRN
  Administered 2023-01-13: 30 mg via INTRAVENOUS
  Administered 2023-01-13: 50 mg via INTRAVENOUS

## 2023-01-13 MED ORDER — ONDANSETRON HCL 4 MG/2ML IJ SOLN
INTRAMUSCULAR | Status: DC | PRN
  Administered 2023-01-13: 4 mg via INTRAVENOUS

## 2023-01-13 MED ORDER — PHENAZOPYRIDINE HCL 200 MG PO TABS
200.0000 mg | ORAL_TABLET | Freq: Three times a day (TID) | ORAL | 0 refills | Status: DC | PRN
  Filled 2023-01-13: qty 30, 10d supply, fill #0

## 2023-01-13 MED ORDER — SODIUM CHLORIDE 0.9 % IR SOLN
Status: DC | PRN
Start: 2023-01-13 — End: 2023-01-13
  Administered 2023-01-13: 3000 mL via INTRAVESICAL

## 2023-01-13 MED ORDER — ORAL CARE MOUTH RINSE: 15.0000 mL | Freq: Once | OROMUCOSAL | Status: AC

## 2023-01-13 MED ORDER — LIDOCAINE HCL (CARDIAC) PF 100 MG/5ML IV SOSY
PREFILLED_SYRINGE | INTRAVENOUS | Status: DC | PRN
  Administered 2023-01-13: 60 mg via INTRAVENOUS

## 2023-01-13 MED ORDER — PHENYLEPHRINE HCL-NACL 20-0.9 MG/250ML-% IV SOLN
INTRAVENOUS | Status: DC | PRN
Start: 2023-01-13 — End: 2023-01-13
  Administered 2023-01-13: 20 ug/min via INTRAVENOUS

## 2023-01-13 MED ORDER — ONDANSETRON HCL 4 MG/2ML IJ SOLN
INTRAMUSCULAR | Status: AC
  Filled 2023-01-13: qty 2

## 2023-01-13 MED ORDER — LACTATED RINGERS IV SOLN: INTRAVENOUS | Status: DC

## 2023-01-13 MED ORDER — FENTANYL CITRATE (PF) 100 MCG/2ML IJ SOLN
INTRAMUSCULAR | Status: AC
  Filled 2023-01-13: qty 2

## 2023-01-13 SURGICAL SUPPLY — 22 items
BAG DRN RND TRDRP ANRFLXCHMBR (UROLOGICAL SUPPLIES) ×1
BAG URINE DRAIN 2000ML AR STRL (UROLOGICAL SUPPLIES) ×1 IMPLANT
BALLN NEPHROSTOMY (BALLOONS) ×1
BALLOON NEPHROSTOMY (BALLOONS) IMPLANT
CATH FOLEY 2W COUNCIL 20FR 5CC (CATHETERS) IMPLANT
CATH FOLEY 2WAY SLVR 18FR 30CC (CATHETERS) IMPLANT
CATH ROBINSON RED A/P 14FR (CATHETERS) ×1 IMPLANT
CATH URET 5FR 70CM CONE TIP (BALLOONS) IMPLANT
CATH URETL OPEN 5X70 (CATHETERS) IMPLANT
CLOTH BEACON ORANGE TIMEOUT ST (SAFETY) ×1 IMPLANT
GLOVE BIO SURGEON STRL SZ7.5 (GLOVE) ×1 IMPLANT
GOWN STRL REUS W/ TWL XL LVL3 (GOWN DISPOSABLE) ×1 IMPLANT
GOWN STRL REUS W/TWL XL LVL3 (GOWN DISPOSABLE) ×1
GUIDEWIRE ANG ZIPWIRE 038X150 (WIRE) IMPLANT
GUIDEWIRE STR DUAL SENSOR (WIRE) ×1 IMPLANT
KIT TURNOVER KIT A (KITS) IMPLANT
MANIFOLD NEPTUNE II (INSTRUMENTS) IMPLANT
NS IRRIG 1000ML POUR BTL (IV SOLUTION) IMPLANT
PACK CYSTO (CUSTOM PROCEDURE TRAY) ×1 IMPLANT
PAD PREP 24X48 CUFFED NSTRL (MISCELLANEOUS) ×1 IMPLANT
PENCIL SMOKE EVACUATOR (MISCELLANEOUS) IMPLANT
WATER STERILE IRR 3000ML UROMA (IV SOLUTION) ×1 IMPLANT

## 2023-01-13 NOTE — Anesthesia Procedure Notes (Signed)
Procedure Name: LMA Insertion Date/Time: 01/13/2023 8:38 AM  Performed by: Ludwig Lean, CRNAPre-anesthesia Checklist: Patient identified, Emergency Drugs available, Suction available and Patient being monitored Patient Re-evaluated:Patient Re-evaluated prior to induction Oxygen Delivery Method: Circle system utilized Preoxygenation: Pre-oxygenation with 100% oxygen Induction Type: IV induction Ventilation: Mask ventilation without difficulty LMA: LMA inserted LMA Size: 4.0 Number of attempts: 1 Placement Confirmation: positive ETCO2 and breath sounds checked- equal and bilateral Tube secured with: Tape Dental Injury: Teeth and Oropharynx as per pre-operative assessment

## 2023-01-13 NOTE — Anesthesia Postprocedure Evaluation (Signed)
Anesthesia Post Note  Patient: Jacob Harrison  Procedure(s) Performed: CYSTOSCOPY WITH URETHRAL DILATATION     Patient location during evaluation: PACU Anesthesia Type: General Level of consciousness: awake and alert Pain management: pain level controlled Vital Signs Assessment: post-procedure vital signs reviewed and stable Respiratory status: spontaneous breathing, nonlabored ventilation and respiratory function stable Cardiovascular status: stable, blood pressure returned to baseline and bradycardic Anesthetic complications: no   No notable events documented.  Last Vitals:  Vitals:   01/13/23 0930 01/13/23 0940  BP: (!) 92/59 (!) 90/54  Pulse: (!) 57 (!) 58  Resp: 10 13  Temp:  36.5 C  SpO2: 97% 97%    Last Pain:  Vitals:   01/13/23 0940  TempSrc:   PainSc: 0-No pain                 Beryle Lathe

## 2023-01-13 NOTE — Op Note (Signed)
Operative Note  Preoperative diagnosis:  1.  Bulbar urethral stricture  Postoperative diagnosis: 1.  Thin bulbar urethral stricture  Procedure(s): 1.  Cystoscopy with balloon dilation of urethral stricture  Surgeon: Rhoderick Moody, MD  Assistants:  None  Anesthesia:  General  Complications:  None  EBL: Less than 5 mL  Specimens: 1.  None  Drains/Catheters: 1.  18 French Foley catheter with 10 mL of sterile water in the balloon  Intraoperative findings:   2 mm bulbar urethral stricture No intravesical abnormalities Nonobstructing prostatic urethra  Indication:  Jacob Harrison is a 82 y.o. male with a history of worsening lower urinary tract symptoms and hematuria.  He underwent a CT urogram and cystoscopy in the office and was found to have a bulbar urethral stricture that could not be bypassed by a flexible cystoscope.  He has been consented for the above procedures, voices understanding and wishes to proceed.  Description of procedure:  After informed consent was obtained, the patient was brought to the operating room and general LMA anesthesia was administered. The patient was then placed in the dorsolithotomy position and prepped and draped in the usual sterile fashion. A timeout was performed. A 23 French rigid cystoscope was then inserted into the urethral meatus and advanced until his then bulbar urethral stricture was identified.  A sensor wire was then advanced through the aperture of the stricture and into the bladder.  A 30 French balloon dilator was then inserted over the wire and into the proximal aspects of the urethra.  The balloon dilator was then inflated up to 20 atm and left in place for several seconds to allow passive dilation.  The balloon dilator was then deflated and removed, leaving the wire in place.  The rigid cystoscope was then reinserted into the urethra and advanced beyond the area of stricture.  A full inspection of the bladder revealed  no intravesical abnormalities.  His prostatic urethra was found to be nonobstructing.  The area of stricture within the bulbar urethra was found to be approximately 2 mm in length with an approximate 14 French aperture prior to the balloon dilation.  Following dilation the urethra easily accommodated the 23 French rigid cystoscope.  The scope was then removed.  An 31 French Foley catheter was then inserted over the wire and into the bladder with return of clear irrigant.  The balloon of the catheter was then inflated with 10 mL sterile water and placed to gravity drainage.  The patient tolerated the procedure well and was transferred to the postanesthesia unit in stable condition.  Plan: The patient has been instructed to remove his Foley catheter at 7 AM on 01/19/2023

## 2023-01-13 NOTE — Transfer of Care (Signed)
Immediate Anesthesia Transfer of Care Note  Patient: Jacob Harrison  Procedure(s) Performed: CYSTOSCOPY WITH URETHRAL DILATATION  Patient Location: PACU  Anesthesia Type:General  Level of Consciousness: awake and alert   Airway & Oxygen Therapy: Patient Spontanous Breathing and Patient connected to face mask oxygen  Post-op Assessment: Report given to RN and Post -op Vital signs reviewed and stable  Post vital signs: Reviewed and stable  Last Vitals:  Vitals Value Taken Time  BP 95/58 01/13/23 0907  Temp 36.5 C 01/13/23 0907  Pulse 60 01/13/23 0909  Resp 16 01/13/23 0909  SpO2 100 % 01/13/23 0909  Vitals shown include unfiled device data.  Last Pain:  Vitals:   01/13/23 0642  TempSrc: Oral  PainSc: 0-No pain         Complications: No notable events documented.

## 2023-01-14 ENCOUNTER — Encounter (HOSPITAL_COMMUNITY): Payer: Self-pay | Admitting: Urology

## 2023-01-17 ENCOUNTER — Other Ambulatory Visit: Payer: Self-pay | Admitting: Internal Medicine

## 2023-01-17 DIAGNOSIS — I25118 Atherosclerotic heart disease of native coronary artery with other forms of angina pectoris: Secondary | ICD-10-CM

## 2023-01-19 ENCOUNTER — Other Ambulatory Visit (HOSPITAL_COMMUNITY): Payer: Self-pay

## 2023-01-26 ENCOUNTER — Encounter: Payer: Self-pay | Admitting: Podiatry

## 2023-01-26 ENCOUNTER — Ambulatory Visit (INDEPENDENT_AMBULATORY_CARE_PROVIDER_SITE_OTHER): Payer: Medicare HMO

## 2023-01-26 ENCOUNTER — Ambulatory Visit: Payer: Medicare HMO | Admitting: Podiatry

## 2023-01-26 DIAGNOSIS — I999 Unspecified disorder of circulatory system: Secondary | ICD-10-CM | POA: Diagnosis not present

## 2023-01-26 DIAGNOSIS — M778 Other enthesopathies, not elsewhere classified: Secondary | ICD-10-CM | POA: Diagnosis not present

## 2023-01-26 DIAGNOSIS — B351 Tinea unguium: Secondary | ICD-10-CM

## 2023-01-26 DIAGNOSIS — M205X1 Other deformities of toe(s) (acquired), right foot: Secondary | ICD-10-CM | POA: Diagnosis not present

## 2023-01-26 MED ORDER — TRIAMCINOLONE ACETONIDE 10 MG/ML IJ SUSP
10.0000 mg | Freq: Once | INTRAMUSCULAR | Status: AC
Start: 2023-01-26 — End: 2023-01-26
  Administered 2023-01-26: 10 mg via INTRA_ARTICULAR

## 2023-01-26 NOTE — Progress Notes (Signed)
Subjective:   Patient ID: Jacob Harrison, male   DOB: 82 y.o.   MRN: [id number]   HPI Patient presents stating that he is doing well but has pain in the big toe joint right has nail disease and history of circulation issues and tries to walk short distances.  Patient does not smoke   Review of Systems  All other systems reviewed and are negative.       Objective:  Physical Exam Vitals and nursing note reviewed.  Constitutional:      Appearance: He is well-developed.  Pulmonary:     Effort: Pulmonary effort is normal.  Musculoskeletal:        General: Normal range of motion.  Skin:    General: Skin is warm.  Neurological:     Mental Status: He is alert.     Neurological status intact vascular status diminished with diminishment of DP PT pulses history of bypass surgery of his heart with reduced range of motion first MPJ right left is doing well with discomfort within the joint surface and pain with fluid buildup.  Good digital perfusion well-oriented x 3     Assessment:  Inflammatory capsulitis first MPJ right hallux limitus deformity also noted to have mycotic nail infections and vascular disease     Plan:  H&P discussed all conditions and today organ to focus on the inflammation within his big toe joint and I did sterile prep periarticular injection around the first MPJ 3 mg dexamethasone Kenalog 5 mg Xylocaine to reduce the stress and advised on rigid bottom shoes.  If symptoms persist may have to consider other treatments  X-rays indicate that there is narrowing of the joint surface small dorsal spurring noted no other pathology

## 2023-01-29 DIAGNOSIS — R310 Gross hematuria: Secondary | ICD-10-CM | POA: Diagnosis not present

## 2023-01-29 DIAGNOSIS — N35814 Other anterior urethral stricture, male: Secondary | ICD-10-CM | POA: Diagnosis not present

## 2023-02-04 ENCOUNTER — Encounter: Payer: Self-pay | Admitting: Family Medicine

## 2023-02-28 ENCOUNTER — Other Ambulatory Visit: Payer: Self-pay | Admitting: Internal Medicine

## 2023-03-02 ENCOUNTER — Other Ambulatory Visit (HOSPITAL_BASED_OUTPATIENT_CLINIC_OR_DEPARTMENT_OTHER): Payer: Self-pay

## 2023-03-02 ENCOUNTER — Other Ambulatory Visit: Payer: Self-pay | Admitting: Family Medicine

## 2023-03-02 DIAGNOSIS — N401 Enlarged prostate with lower urinary tract symptoms: Secondary | ICD-10-CM

## 2023-03-02 NOTE — Telephone Encounter (Signed)
last visit 12/18/22 with plan to f/u in 3 months.  Next visit: 04/13/23

## 2023-03-18 ENCOUNTER — Ambulatory Visit (INDEPENDENT_AMBULATORY_CARE_PROVIDER_SITE_OTHER): Payer: Medicare HMO

## 2023-03-18 DIAGNOSIS — I255 Ischemic cardiomyopathy: Secondary | ICD-10-CM

## 2023-03-18 LAB — CUP PACEART REMOTE DEVICE CHECK
Battery Remaining Longevity: 117 mo
Battery Voltage: 3.03 V
Brady Statistic RV Percent Paced: 0.58 %
Date Time Interrogation Session: 20241211012406
HighPow Impedance: 68 Ohm
Implantable Lead Connection Status: 753985
Implantable Lead Implant Date: 20221214
Implantable Lead Location: 753860
Implantable Pulse Generator Implant Date: 20221214
Lead Channel Impedance Value: 304 Ohm
Lead Channel Impedance Value: 399 Ohm
Lead Channel Pacing Threshold Amplitude: 0.75 V
Lead Channel Pacing Threshold Pulse Width: 0.4 ms
Lead Channel Sensing Intrinsic Amplitude: 12.625 mV
Lead Channel Sensing Intrinsic Amplitude: 12.625 mV
Lead Channel Setting Pacing Amplitude: 1.5 V
Lead Channel Setting Pacing Pulse Width: 0.4 ms
Lead Channel Setting Sensing Sensitivity: 0.3 mV
Zone Setting Status: 755011
Zone Setting Status: 755011

## 2023-03-20 ENCOUNTER — Telehealth: Payer: Self-pay

## 2023-03-20 ENCOUNTER — Encounter: Payer: Self-pay | Admitting: Internal Medicine

## 2023-03-20 DIAGNOSIS — G43909 Migraine, unspecified, not intractable, without status migrainosus: Secondary | ICD-10-CM

## 2023-03-20 MED ORDER — SUMATRIPTAN SUCCINATE 50 MG PO TABS
50.0000 mg | ORAL_TABLET | Freq: Once | ORAL | 0 refills | Status: DC
Start: 2023-03-20 — End: 2023-04-13

## 2023-03-20 MED ORDER — PROCHLORPERAZINE MALEATE 5 MG PO TABS
5.0000 mg | ORAL_TABLET | Freq: Four times a day (QID) | ORAL | 0 refills | Status: DC | PRN
Start: 2023-03-20 — End: 2023-04-13

## 2023-03-20 NOTE — Telephone Encounter (Signed)
Copied from CRM [id number]. Topic: Clinical - Medical Advice >> Mar 20, 2023  9:47 AM Jacob Harrison wrote: Reason for CRM: Patient has been experiencing headaches for the past few days - Tylenol 1000 mg every 6 hours - it helps but not enough - patient reports that the headache tends to come back before he takes his next dosage. Is there something else that the patient can do?

## 2023-03-20 NOTE — Telephone Encounter (Signed)
Patient has been informed of new prescriptions and provider recommendations. Patient verbalized understanding and has agreed. All questions and concerns have been addressed.

## 2023-03-20 NOTE — Addendum Note (Signed)
Addended by: Saralyn Pilar on: 03/20/2023 10:12 AM   Modules accepted: Orders

## 2023-03-20 NOTE — Telephone Encounter (Signed)
He can continue to take the Tylenol.  I also sent a medication called sumatriptan to the pharmacy.  He should take 1 dose which should get rid of the headache.  If the headache is not completely gone after 2 hours, he can have 1 more dose.  He should not have more than 2 doses in 24 hours.  I also sent in a medication called prochlorperazine that he can take if even 2 doses of the sumatriptan do not alleviate the headache.  If the headache continues tomorrow even after medications, I would recommend going to the emergency room for evaluation and possible imaging.

## 2023-03-20 NOTE — Telephone Encounter (Signed)
Call placed to the patient concerning his MyChart message. Please see below.  I had a restless night and when I checked my smart watch this morning it showed I had an irregular heart rhythm between 2AM and 3:30AM. The rate varied between 39 and 148 bpm during that period. My ICD probably picked that up as well. My blood pressure this morning was low 80's / low 50's. (a little lower than usual). I don't feel faint but I do feel unsteady to the point that I'm using a cane around the house. Any advice?   He stated that his blood pressure has gone up to 90/55 which is closer to his norm and that he was feeling much better.   He currently takes: Carvedilol 6.25 mg twice daily Losartan 12.5 mg in the afternoon Imdur 30 mg twice daily  Message sent to the device clinic and providers.

## 2023-03-20 NOTE — Telephone Encounter (Signed)
Transmission received 03/20/2023.    Device alert for AF in progress from 12/13, controlled rates Burden 13.8%, Eliquis per EPIC Monitor for persistence

## 2023-04-02 ENCOUNTER — Encounter: Payer: Self-pay | Admitting: Internal Medicine

## 2023-04-13 ENCOUNTER — Encounter: Payer: Self-pay | Admitting: Internal Medicine

## 2023-04-13 ENCOUNTER — Ambulatory Visit: Payer: Medicare HMO | Attending: Internal Medicine | Admitting: Internal Medicine

## 2023-04-13 VITALS — BP 103/58 | HR 64 | Ht 67.0 in | Wt 160.2 lb

## 2023-04-13 DIAGNOSIS — E1169 Type 2 diabetes mellitus with other specified complication: Secondary | ICD-10-CM

## 2023-04-13 DIAGNOSIS — Z79899 Other long term (current) drug therapy: Secondary | ICD-10-CM

## 2023-04-13 DIAGNOSIS — E785 Hyperlipidemia, unspecified: Secondary | ICD-10-CM

## 2023-04-13 DIAGNOSIS — I480 Paroxysmal atrial fibrillation: Secondary | ICD-10-CM

## 2023-04-13 DIAGNOSIS — I25118 Atherosclerotic heart disease of native coronary artery with other forms of angina pectoris: Secondary | ICD-10-CM

## 2023-04-13 DIAGNOSIS — I5022 Chronic systolic (congestive) heart failure: Secondary | ICD-10-CM

## 2023-04-13 NOTE — Patient Instructions (Signed)
 Medication Instructions:  Your physician recommends the following medication changes.  STOP TAKING: Asprin  *If you need a refill on your cardiac medications before your next appointment, please call your pharmacy*   Lab Work: Your provider would like for you to have following labs drawn today (BMP, BNP, CBC).     Testing/Procedures: No test ordered today    Follow-Up: At [facility], you and your health needs are our priority.  As part of our continuing mission to provide you with exceptional heart care, we have created designated Provider Care Teams.  These Care Teams include your primary Cardiologist (physician) and Advanced Practice Providers (APPs -  Physician Assistants and Nurse Practitioners) who all work together to provide you with the care you need, when you need it.  We recommend signing up for the patient portal called MyChart.  Sign up information is provided on this After Visit Summary.  MyChart is used to connect with patients for Virtual Visits (Telemedicine).  Patients are able to view lab/test results, encounter notes, upcoming appointments, etc.  Non-urgent messages can be sent to your provider as well.   To learn more about what you can do with MyChart, go to [email or web address].    Your next appointment:   3 month(s)  Provider:   You may see Lonni Hanson, MD or one of the following Advanced Practice Providers on your designated Care Team:   Lonni Meager, NP Bernardino Bring, PA-C Cadence Franchester, PA-C Tylene Lunch, NP Barnie Hila, NP

## 2023-04-13 NOTE — Progress Notes (Signed)
 Cardiology Office Note:  .   Date:  04/13/2023  ID:  Jacob Harrison, DOB [birth year], MRN [id number] PCP: Jacob Joesph LABOR, PA  [facility] Providers Cardiologist:  Jacob Hanson, MD Electrophysiologist:  Jacob ONEIDA HOLTS, MD     History of Present Illness: .   Jacob Harrison is a 83 y.o. 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  I last saw him in September, at which time he reported occasional dyspnea and chest tightness when exercising and pushing his heart rates above 90 to 100 bpm.  He had typically been asymptomatic at rest though he recounted a single episode of chest tightness and shortness of breath that woke him up night about 3 weeks before our visit.  Pain resolved after sublingual nitroglycerin  x 2 and had not recurred.  We agreed to defer additional testing and continue his antianginal regimen consisting of ranolazine , carvedilol , and isosorbide  mononitrate.  Soft blood pressure precluded escalation of GDMT.  Today, [name] reports that he feels a little bit more short of breath than at our last visit.  He now has to stop sooner when exercising on his recumbent bike.  He is also taking sublingual nitroglycerin  a little bit more often, usually 3-4 times a week for dyspnea and tightness in his jaw that has been his anginal equivalent.  He has not had any further episodes of chest pain at rest.  He believes he had an episode of atrial fibrillation in December, as his watch indicated an irregular heartbeat between 2 and 3 AM.  He has been asymptomatic but is scheduled to follow-up with the atrial fibrillation  clinic later this week.  He also notes occasional epistaxis described as streaks of blood when blowing his nose.  He has not had any significant bleeding though he notes it seems to be happening most days.  He recently had some sinus congestion and this improved with nasal saline spray.  He has noticed a little bit more leg swelling.  He tried to reduce his furosemide  to 40 mg in the morning and 20 mg in the evening, but had considerable swelling with this.  He is now back on his 80 mg in the morning and 40 mg in the afternoon dosing with return to baseline of his edema.  ROS: See HPI  Studies Reviewed: SABRA   EKG Interpretation Date/Time:  Monday April 13 2023 11:39:40 EST Ventricular Rate:  64 PR Interval:  220 QRS Duration:  96 QT Interval:  420 QTC Calculation: 433 R Axis:   24  Text Interpretation: Sinus rhythm with 1st degree A-V block Inferior infarct (cited on or before 27-Aug-2022) Anterolateral infarct (cited on or before 27-Aug-2022) Abnormal ECG When compared with ECG of 18-Dec-2022 12:10, No significant change was found Confirmed by Ruhani Umland [id number]) on 04/13/2023 11:42:40 AM    Risk Assessment/Calculations:    CHA2DS2-VASc Score = 6   This indicates a 9.7% annual risk of stroke. The patient's score is based upon: CHF History: 1 HTN History: 1 Diabetes History: 1 Stroke History: 0 Vascular Disease History: 1 Age Score: 2 Gender Score: 0            Physical Exam:  VS:  BP (!) 103/58 (BP Location: Left Arm, Patient Position: Sitting, Cuff Size: Normal)   Pulse 64   Ht 5' 7 (1.702 m)   Wt 160 lb 3.2 oz (72.7 kg)   SpO2 98%   BMI 25.09 kg/m    Wt Readings from Last 3 Encounters:  04/13/23 160 lb 3.2 oz (72.7 kg)  01/13/23 159 lb (72.1 kg)  01/07/23 159 lb (72.1 kg)    General:  NAD. Neck: No JVD or HJR. Lungs: Bibasilar crackles, left greater than right. Heart: Regular rate and rhythm with 2/6 systolic murmur. Abdomen: Soft, nontender,  nondistended. Extremities: Trace to 1+ ankle edema.  ASSESSMENT AND PLAN: .    Chronic HFrEF: Volume status appears relatively stable with continued NYHA class II-3 heart failure symptoms.  I will check a BMP and BNP today with plans to continue current Lasix  dosing of 80 mg every morning and 40 mg every afternoon.  Continue current GDMT consisting of carvedilol  6.25 mg twice daily, losartan  12.5 mg daily, and empagliflozin  10 mg daily.  Unable to escalate GDMT further at this time due to soft blood pressure.  If symptoms continue to progress, consultation with the advanced heart failure team will need to be considered.  Coronary artery disease with stable angina: Overall, exertional angina is fairly stable though Mr. Baiz is using sublingual nitroglycerin  a little bit more and is unable to use his recumbent bike quite as long before needing to stop.  I have again reviewed his last catheterization from 05/2022, demonstrating severe native CAD and occlusion of all bypass grafts.  Only patent conjoined is left main into LCx, which collateralizes the rest of the coronary distributions.  We discussed moving forward with catheterization to see if there has been any further progression, though we have agreed to defer this for now.  Interventional targets are limited and would be high risk.  Given that he has already undergone CABG and redo CABG, I doubt that further surgical revascularization is feasible.  Continue secondary prevention with apixaban  and evolocumab .  Given report of epistaxis, I will hold aspirin  and check a CBC to ensure that symptomatic anemia is not playing a role in his progressive exercise intolerance.  Continue antianginal therapy with carvedilol , isosorbide  mononitrate, and ranolazine .  Paroxysmal atrial fibrillation: Patient in sinus rhythm today.  Continue current doses of carvedilol  and apixaban .  Mr. Vogelsang is scheduled for follow-up in atrial fibrillation clinic later this  week.  Hyperlipidemia associated with type 2 diabetes mellitus and statin induced rhabdomyolysis: LDL at goal on last check in September.  Continue with the below segment.  Ongoing management of DM per PCP.  Will not rechallenge with a statin given history of rhabdomyolysis thought to be due to statin use.    Dispo: Return to clinic in 3 months.  Signed, Jacob Hanson, MD

## 2023-04-14 LAB — BASIC METABOLIC PANEL
BUN/Creatinine Ratio: 22 (ref 10–24)
BUN: 22 mg/dL (ref 8–27)
CO2: 24 mmol/L (ref 20–29)
Calcium: 9.5 mg/dL (ref 8.6–10.2)
Chloride: 95 mmol/L — ABNORMAL LOW (ref 96–106)
Creatinine, Ser: 1 mg/dL (ref 0.76–1.27)
Glucose: 98 mg/dL (ref 70–99)
Potassium: 4.7 mmol/L (ref 3.5–5.2)
Sodium: 138 mmol/L (ref 134–144)
eGFR: 76 mL/min/{1.73_m2} (ref >59)

## 2023-04-14 LAB — CBC
Hematocrit: 43.8 % (ref 37.5–51.0)
Hemoglobin: 14.9 g/dL (ref 13.0–17.7)
MCH: 33.1 pg — ABNORMAL HIGH (ref 26.6–33.0)
MCHC: 34 g/dL (ref 31.5–35.7)
MCV: 97 fL (ref 79–97)
Platelets: 237 10*3/uL (ref 150–450)
RBC: 4.5 x10E6/uL (ref 4.14–5.80)
RDW: 13.3 % (ref 11.6–15.4)
WBC: 10.1 10*3/uL (ref 3.4–10.8)

## 2023-04-14 LAB — BRAIN NATRIURETIC PEPTIDE: BNP: 215.4 pg/mL — ABNORMAL HIGH (ref 0.0–100.0)

## 2023-04-15 ENCOUNTER — Other Ambulatory Visit: Payer: Self-pay

## 2023-04-15 DIAGNOSIS — I5022 Chronic systolic (congestive) heart failure: Secondary | ICD-10-CM

## 2023-04-15 MED ORDER — FUROSEMIDE 80 MG PO TABS: 80.0000 mg | ORAL_TABLET | Freq: Two times a day (BID) | ORAL | 3 refills | Status: DC

## 2023-04-17 ENCOUNTER — Ambulatory Visit (HOSPITAL_COMMUNITY)
Admission: RE | Admit: 2023-04-17 | Discharge: 2023-04-17 | Disposition: A | Discharge status: Home / Self Care | Payer: Medicare HMO | Source: Ambulatory Visit | Attending: Internal Medicine | Admitting: Internal Medicine

## 2023-04-17 ENCOUNTER — Other Ambulatory Visit: Payer: Self-pay | Admitting: Internal Medicine

## 2023-04-17 VITALS — BP 98/56 | HR 64 | Ht 67.0 in | Wt 163.0 lb

## 2023-04-17 DIAGNOSIS — I4891 Unspecified atrial fibrillation: Secondary | ICD-10-CM | POA: Diagnosis not present

## 2023-04-17 DIAGNOSIS — I110 Hypertensive heart disease with heart failure: Secondary | ICD-10-CM | POA: Diagnosis not present

## 2023-04-17 DIAGNOSIS — I480 Paroxysmal atrial fibrillation: Secondary | ICD-10-CM | POA: Diagnosis not present

## 2023-04-17 DIAGNOSIS — Z7901 Long term (current) use of anticoagulants: Secondary | ICD-10-CM | POA: Diagnosis not present

## 2023-04-17 DIAGNOSIS — I2510 Atherosclerotic heart disease of native coronary artery without angina pectoris: Secondary | ICD-10-CM | POA: Insufficient documentation

## 2023-04-17 DIAGNOSIS — Z951 Presence of aortocoronary bypass graft: Secondary | ICD-10-CM | POA: Diagnosis not present

## 2023-04-17 DIAGNOSIS — Z79899 Other long term (current) drug therapy: Secondary | ICD-10-CM | POA: Insufficient documentation

## 2023-04-17 DIAGNOSIS — D6869 Other thrombophilia: Secondary | ICD-10-CM | POA: Diagnosis not present

## 2023-04-17 DIAGNOSIS — I5042 Chronic combined systolic (congestive) and diastolic (congestive) heart failure: Secondary | ICD-10-CM | POA: Diagnosis not present

## 2023-04-17 DIAGNOSIS — Z95810 Presence of automatic (implantable) cardiac defibrillator: Secondary | ICD-10-CM | POA: Insufficient documentation

## 2023-04-17 NOTE — Progress Notes (Signed)
 Primary Care Physician: Wallace Joesph LABOR, PA Primary Cardiologist: Dr End Primary Electrophysiologist: Dr Cindie Referring Physician: Dr Cindie [name] is a 83 y.o. male with a history of HFrEF, CAD s/p CABG x2, s/p ICD, DM, HLD, HTN, atrial fibrillation who presents for consultation in the [facility]. The device clinic received an alert 08/11/22 for an ongoing afib episode starting 08/09/22. Patient has a CHADS2VASC score of 6.  On follow up today, patient reports that he feels well today. He is in SR. He was unaware of his afib at the time. There were no specific triggers that he could identify.   On follow up 04/17/23, he is currently in NSR. Patient contacted office on 03/20/23 noting Afib with controlled rates. Device interrogation today shows no new episodes of Afib; he has had 1 episode in May and the single episode on 12/13. He is compliant with Eliquis  5 mg BID but does note bruising easily.   Today, he denies symptoms of palpitations, chest pain, shortness of breath, orthopnea, PND, lower extremity edema, dizziness, presyncope, syncope, snoring, daytime somnolence, bleeding, or neurologic sequela. The patient is tolerating medications without difficulties and is otherwise without complaint today.    Atrial Fibrillation Risk Factors:  he does not have symptoms or diagnosis of sleep apnea. he does not have a history of rheumatic fever. he does not have a history of alcohol use.   he has a BMI of Body mass index is 25.53 kg/m.SABRA Filed Weights   04/17/23 1108  Weight: 73.9 kg     Family History  Problem Relation Age of Onset   Diabetes Mother    Hyperlipidemia Mother    Cancer Father        colon   Skin cancer Brother    Alcohol abuse Maternal Uncle    Diabetes Maternal Uncle    Hyperlipidemia Maternal Uncle    Cancer Paternal Uncle        colon     Atrial Fibrillation Management history:  Previous antiarrhythmic  drugs: none Previous cardioversions: none Previous ablations: none Anticoagulation history: Eliquis  5 mg BID   Past Medical History:  Diagnosis Date   (HFpEF) heart failure with preserved ejection fraction (HCC)    a. 10/2016 Echo: EF 65-70%, mild LVH, mildly dil LA. RV fxn nl.   Arthritis    BPH (benign prostatic hyperplasia)    Complication of anesthesia    dificult entubation,narrow   Coronary artery disease    a. 2001 CABG x 3 ([place] ): LIMA->LAD, VG->LCX, VG->RPDA; b. 04/05/2010 Cath ([place]): LM 5m, LAD 158m, LCX 70m, RCA 100p, LIMA->LAD atretic, VG->LCX 100, VG->RCA 80ost, 70d; c. 04/2010 Redo CABG x 2 ([place]): VG->LAD, VG->RPDA; d. 10/2012 Cath [place]): LM 20-30, LAD 100/95-4m, LCX 40-12m, RCA 100p, 80-100d, RPDA 99, RPL1 95, VG->LAD ok, VG->RPDA ok, EF 67%; e. 11/2016 MV: EF 48%, No ischemia.   Diabetes mellitus without complication (HCC)    Type II   Dysrhythmia    Heart murmur    Hyperlipidemia    Hypertension    Ischemic cardiomyopathy    a. 03/2021 s/p MDT Visia AF MRI VR SureScan single lead AICD (ser# [id number]).   Myocardial infarction [facility])    Scoliosis    Spinal stenosis    Past Surgical History:  Procedure Laterality Date   BACK SURGERY  2020   laminectomy L4 L5   CARDIAC CATHETERIZATION     CATARACT EXTRACTION, BILATERAL  10/2022   CORONARY ARTERY BYPASS  GRAFT     x 2 (2001 and redo in late 2011 or early 2012)   CYSTOSCOPY WITH URETHRAL DILATATION N/A 01/13/2023   Procedure: CYSTOSCOPY WITH URETHRAL DILATATION;  Surgeon: Devere Lonni Righter, MD;  Location: [facility] ORS;  Service: Urology;  Laterality: N/A;  30 MINUTES   HERNIA REPAIR Left    inguinal   ICD IMPLANT N/A 03/20/2021   Procedure: ICD IMPLANT;  Surgeon: Cindie Ole DASEN, MD;  Location: [facility] INVASIVE CV LAB;  Service: Cardiovascular;  Laterality: N/A;   LEFT HEART CATH AND CORS/GRAFTS ANGIOGRAPHY N/A 10/04/2020   Procedure: LEFT HEART CATH AND CORS/GRAFTS ANGIOGRAPHY;  Surgeon: Anner Alm ORN, MD;  Location: [facility] INVASIVE CV LAB;  Service: Cardiovascular;  Laterality: N/A;   LUMBAR LAMINECTOMY/DECOMPRESSION MICRODISCECTOMY N/A 10/20/2018   Procedure: L3-4, L4-5 LUMBAR DECOMPRESSION with dural repair.;  Surgeon: Barbarann Oneil BROCKS, MD;  Location: [facility] OR;  Service: Orthopedics;  Laterality: N/A;   RIGHT/LEFT HEART CATH AND CORONARY/GRAFT ANGIOGRAPHY N/A 05/27/2022   Procedure: RIGHT/LEFT HEART CATH AND CORONARY/GRAFT ANGIOGRAPHY;  Surgeon: Mady Lonni, MD;  Location: [facility] INVASIVE CV LAB;  Service: Cardiovascular;  Laterality: N/A;   TONSILLECTOMY AND ADENOIDECTOMY     TRANSURETHRAL RESECTION OF PROSTATE  1990    Current Outpatient Medications  Medication Sig Dispense Refill   acetaminophen  (TYLENOL ) 500 MG tablet Take 1,000 mg by mouth as needed for moderate pain or headache.     apixaban  (ELIQUIS ) 5 MG TABS tablet Take 1 tablet (5 mg total) by mouth 2 (two) times daily. 180 tablet 1   carvedilol  (COREG ) 12.5 MG tablet Take 0.5 tablets (6.25 mg total) by mouth 2 (two) times daily with a meal. 180 tablet 3   Cholecalciferol (VITAMIN D -3 PO) Take 800 Units by mouth in the morning.     cyanocobalamin  500 MCG tablet Take 500 mcg by mouth in the morning.     Evolocumab  (REPATHA  SURECLICK) 140 MG/ML SOAJ Inject 140 mg into the skin every 14 (fourteen) days. 6 mL 3   finasteride  (PROSCAR ) 5 MG tablet TAKE 1 TABLET (5 MG TOTAL) BY MOUTH DAILY. 90 tablet 1   furosemide  (LASIX ) 80 MG tablet Take 1 tablet (80 mg total) by mouth 2 (two) times daily. 90 tablet 3   Glucosamine-Chondroit-Vit C-Mn (GLUCOSAMINE 1500 COMPLEX) CAPS Take 1 capsule by mouth in the morning.     isosorbide  mononitrate (IMDUR ) 30 MG 24 hr tablet Take 1 tablet (30 mg total) by mouth 2 (two) times daily. 180 tablet 3   JARDIANCE  10 MG TABS tablet TAKE 1 TABLET BY MOUTH EVERY DAY BEFORE BREAKFAST 90 tablet 0   Lancets (ONETOUCH ULTRASOFT) lancets Use to check blood sugars fasting daily 100 each 12   losartan  (COZAAR ) 25  MG tablet Take 12.5 mg by mouth daily in the afternoon.     Multiple Vitamins-Minerals (PRESERVISION AREDS 2 PO) Take 1 capsule by mouth 2 (two) times a day.     nitroGLYCERIN  (NITROSTAT ) 0.4 MG SL tablet PLACE 1 TABLET UNDER THE TONGUE EVERY 5 MINUTES AS NEEDED. 25 tablet 1   oxybutynin  (DITROPAN ) 5 MG tablet Take 1 tablet (5 mg total) by mouth every 8 (eight) hours as needed for bladder spasms. (Patient not taking: Reported on 04/17/2023) 30 tablet 1   phenazopyridine  (PYRIDIUM ) 200 MG tablet Take 1 tablet (200 mg total) by mouth 3 (three) times daily as needed (for pain with urination). (Patient not taking: Reported on 04/17/2023) 30 tablet 0   Polyethyl Glycol-Propyl Glycol (LUBRICANT EYE DROPS) 0.4-0.3 %  SOLN Place 1-2 drops into both eyes 3 (three) times daily as needed (dry/irritated eyes.).     ranolazine  (RANEXA ) 1000 MG SR tablet TAKE 1 TABLET BY MOUTH TWICE A DAY 180 tablet 0   tamsulosin (FLOMAX) 0.4 MG CAPS capsule Take 0.4 mg by mouth in the morning.     triamcinolone  cream (KENALOG ) 0.1 % Apply 1 Application topically as directed. Qd up to 5 days per week to itchy rash on body until clear, avoid face, groin, axilla 80 g 1   No current facility-administered medications for this encounter.    Allergies  Allergen Reactions   Statins Other (See Comments)    Rhabdo in combination with Terbinafine     Other Itching and Rash    Steroid injection in 2021 caused rash/itching   Tetracyclines & Related Rash   ROS- All systems are reviewed and negative except as per the HPI above.  Physical Exam: Vitals:   04/17/23 1108  BP: (!) 98/56  Pulse: 64  Weight: 73.9 kg  Height: 5' 7 (1.702 m)    GEN- The patient is well appearing, alert and oriented x 3 today.   Neck - no JVD or carotid bruit noted Lungs- Clear to ausculation bilaterally, normal work of breathing Heart- Regular rate and rhythm, no murmurs, rubs or gallops, PMI not laterally displaced Extremities- no clubbing, cyanosis,  or edema Skin - no rash or ecchymosis noted   Wt Readings from Last 3 Encounters:  04/17/23 73.9 kg  04/13/23 72.7 kg  01/13/23 72.1 kg    EKG today demonstrates  Vent. rate 64 BPM PR interval 206 ms QRS duration 92 ms QT/QTcB 408/420 ms P-R-T axes 79 69 46 Sinus rhythm with occasional Premature ventricular complexes Inferior infarct , age undetermined Anterolateral infarct , age undetermined Abnormal ECG When compared with ECG of 13-Apr-2023 11:39, PREVIOUS ECG IS PRESENT  Echo 04/21/22 demonstrated  1. Left ventricular ejection fraction, by estimation, is 30 to 35%. The  left ventricle has moderate to severely decreased function. The left  ventricle demonstrates global hypokinesis. There is mild left ventricular  hypertrophy of the basal-septal segment. Left ventricular diastolic parameters are indeterminate.   2. Right ventricular systolic function is low normal. The right  ventricular size is mildly enlarged.   3. Left atrial size was mildly dilated.   4. The mitral valve is normal in structure. Mild mitral valve  regurgitation.   5. The aortic valve is tricuspid. Aortic valve regurgitation is not  visualized. Mild aortic valve stenosis. Aortic valve mean gradient  measures 11.0 mmHg.   6. Aortic dilatation noted. There is mild dilatation of the ascending  aorta, measuring 39 mm.   7. The inferior vena cava is normal in size with greater than 50%  respiratory variability, suggesting right atrial pressure of 3 mmHg.   Comparison(s): Limited TTE (02/20/2021): LVEF 30-35%. Mild mitral  regurgitation. Aortic sclerosis without stenosis.   Epic records are reviewed at length today.  CHA2DS2-VASc Score = 6  The patient's score is based upon: CHF History: 1 HTN History: 1 Diabetes History: 1 Stroke History: 0 Vascular Disease History: 1 Age Score: 2 Gender Score: 0      ASSESSMENT AND PLAN: 1. Paroxysmal Atrial Fibrillation (ICD10:  I48.0) The patient's  CHA2DS2-VASc score is 6, indicating a 9.7% annual risk of stroke.    He is currently in NSR. Device interrogation today shows no new episodes of Afib and overall very low Afib burden. We will continue with conservative observation via device  checks.   Continue carvedilol  6.25 mg BID.  Qtc 433 ms. CrCl 61 mL/min  2. Secondary Hypercoagulable State (ICD10:  D68.69) The patient is at significant risk for stroke/thromboembolism based upon his CHA2DS2-VASc Score of 6.  Start Apixaban  (Eliquis ).  No missed doses.   3. CAD S/p CABG On Imdur  and Ranexa  No anginal symptoms today.  4. Chronic HFrEF EF 30-35%, s/p ICD GDMT per Dr End. Fluid status appears stable today.  5. HTN Stable, no changes today   Follow up Afib clinic prn.   Jacob Heinrich, PA-C Afib Clinic [facility] [address] [place], [place] [address] [phone] 04/17/2023 11:39 AM

## 2023-04-19 ENCOUNTER — Encounter: Payer: Self-pay | Admitting: Internal Medicine

## 2023-04-20 ENCOUNTER — Other Ambulatory Visit: Payer: Self-pay | Admitting: *Deleted

## 2023-04-20 MED ORDER — NITROGLYCERIN 0.4 MG SL SUBL: 0.4000 mg | SUBLINGUAL_TABLET | SUBLINGUAL | 3 refills | Status: DC | PRN

## 2023-04-20 NOTE — Telephone Encounter (Addendum)
 Error

## 2023-04-24 ENCOUNTER — Other Ambulatory Visit: Payer: Self-pay | Admitting: Internal Medicine

## 2023-04-24 DIAGNOSIS — I25118 Atherosclerotic heart disease of native coronary artery with other forms of angina pectoris: Secondary | ICD-10-CM

## 2023-04-24 NOTE — Progress Notes (Signed)
Remote ICD transmission.   

## 2023-05-04 ENCOUNTER — Other Ambulatory Visit
Admission: RE | Admit: 2023-05-04 | Discharge: 2023-05-04 | Disposition: A | Discharge status: Home / Self Care | Payer: Medicare HMO | Attending: Internal Medicine | Admitting: Internal Medicine

## 2023-05-04 DIAGNOSIS — I5022 Chronic systolic (congestive) heart failure: Secondary | ICD-10-CM | POA: Diagnosis not present

## 2023-05-04 LAB — BASIC METABOLIC PANEL
Anion gap: 12 (ref 5–15)
BUN: 25 mg/dL — ABNORMAL HIGH (ref 8–23)
CO2: 28 mmol/L (ref 22–32)
Calcium: 9.1 mg/dL (ref 8.9–10.3)
Chloride: 94 mmol/L — ABNORMAL LOW (ref 98–111)
Creatinine, Ser: 0.89 mg/dL (ref 0.61–1.24)
GFR, Estimated: >60 mL/min (ref >60)
Glucose, Bld: 98 mg/dL (ref 70–99)
Potassium: 4.1 mmol/L (ref 3.5–5.1)
Sodium: 134 mmol/L — ABNORMAL LOW (ref 135–145)

## 2023-05-14 ENCOUNTER — Encounter: Payer: Self-pay | Admitting: Family Medicine

## 2023-06-04 DIAGNOSIS — H353132 Nonexudative age-related macular degeneration, bilateral, intermediate dry stage: Secondary | ICD-10-CM | POA: Diagnosis not present

## 2023-06-04 DIAGNOSIS — H26493 Other secondary cataract, bilateral: Secondary | ICD-10-CM | POA: Diagnosis not present

## 2023-06-04 LAB — OPHTHALMOLOGY REPORT-SCANNED

## 2023-06-10 ENCOUNTER — Other Ambulatory Visit: Payer: Self-pay | Admitting: Internal Medicine

## 2023-06-17 ENCOUNTER — Ambulatory Visit (INDEPENDENT_AMBULATORY_CARE_PROVIDER_SITE_OTHER): Payer: Medicare HMO

## 2023-06-17 DIAGNOSIS — I255 Ischemic cardiomyopathy: Secondary | ICD-10-CM

## 2023-06-17 LAB — CUP PACEART REMOTE DEVICE CHECK
Battery Remaining Longevity: 114 mo
Battery Voltage: 3.03 V
Brady Statistic RV Percent Paced: 0.16 %
Date Time Interrogation Session: 20250312033524
HighPow Impedance: 72 Ohm
Implantable Lead Connection Status: 753985
Implantable Lead Implant Date: 20221214
Implantable Lead Location: 753860
Implantable Pulse Generator Implant Date: 20221214
Lead Channel Impedance Value: 304 Ohm
Lead Channel Impedance Value: 399 Ohm
Lead Channel Pacing Threshold Amplitude: 0.875 V
Lead Channel Pacing Threshold Pulse Width: 0.4 ms
Lead Channel Sensing Intrinsic Amplitude: 14.125 mV
Lead Channel Sensing Intrinsic Amplitude: 14.125 mV
Lead Channel Setting Pacing Amplitude: 1.5 V
Lead Channel Setting Pacing Pulse Width: 0.4 ms
Lead Channel Setting Sensing Sensitivity: 0.3 mV
Zone Setting Status: 755011
Zone Setting Status: 755011

## 2023-06-18 ENCOUNTER — Encounter: Payer: Self-pay | Admitting: Cardiology

## 2023-06-24 ENCOUNTER — Other Ambulatory Visit: Payer: Self-pay | Admitting: Internal Medicine

## 2023-06-24 DIAGNOSIS — I480 Paroxysmal atrial fibrillation: Secondary | ICD-10-CM

## 2023-06-24 NOTE — Telephone Encounter (Signed)
 Prescription refill request for Eliquis received. Indication: PAF Last office visit: 04/17/23  Tempie Donning PA-C Scr: 0.89 on 05/04/23  Epic Age: 83 Weight: 73.9kg   Based on above findings Eliquis 5mg  twice daily is the appropriate dose.  Refill approved.

## 2023-07-01 ENCOUNTER — Other Ambulatory Visit: Payer: Self-pay | Admitting: Internal Medicine

## 2023-07-15 ENCOUNTER — Encounter: Payer: Self-pay | Admitting: Internal Medicine

## 2023-07-15 ENCOUNTER — Ambulatory Visit: Payer: Medicare HMO | Attending: Internal Medicine | Admitting: Internal Medicine

## 2023-07-15 VITALS — BP 96/54 | HR 65 | Ht 67.0 in | Wt 166.6 lb

## 2023-07-15 DIAGNOSIS — I25118 Atherosclerotic heart disease of native coronary artery with other forms of angina pectoris: Secondary | ICD-10-CM | POA: Diagnosis not present

## 2023-07-15 DIAGNOSIS — I480 Paroxysmal atrial fibrillation: Secondary | ICD-10-CM | POA: Diagnosis not present

## 2023-07-15 DIAGNOSIS — E1169 Type 2 diabetes mellitus with other specified complication: Secondary | ICD-10-CM | POA: Diagnosis not present

## 2023-07-15 DIAGNOSIS — I5022 Chronic systolic (congestive) heart failure: Secondary | ICD-10-CM | POA: Diagnosis not present

## 2023-07-15 DIAGNOSIS — E785 Hyperlipidemia, unspecified: Secondary | ICD-10-CM

## 2023-07-15 DIAGNOSIS — I255 Ischemic cardiomyopathy: Secondary | ICD-10-CM | POA: Diagnosis not present

## 2023-07-15 NOTE — Progress Notes (Unsigned)
  Cardiology Office Note:  .   Date:  07/15/2023  ID:  [name], DOB 1940/09/29, MRN [id number] PCP: [name], PA  [facility] Providers Cardiologist:  [name], MD Electrophysiologist:  [name], MD { Click to update primary MD,subspecialty MD or APP then REFRESH:1}    History of Present Illness: .   Jacob Harrison is a 83 y.o. 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  I last saw him in January, at which time Mr. Hearn reported a little bit more shortness of breath than at our prior visit.  He was also using his sublingual nitroglycerin a little bit more often than in the past.  He reported a possible episode of atrial fibrillation in December, which resolved spontaneously.  Due to concerns about epistaxis, we agreed to hold aspirin and continue apixaban alone.  Less NTG with furosemide 80 BID.  Only feels it when walking in store and doesn't have opportunity to sit down.  No CP at rest or at night for >6 months.  Still has a little edema.  Wt increasing - attributes to voracious appetite.  No palpitations.  Some shortness of breath and tightness in neck, few times a month.  No orthopnea.  No bleeding.  Some weakness below knees when standing for extended periods first thing in the morning.  ROS: See HPI  Studies Reviewed: .        *** Risk Assessment/Calculations:   {Does this patient have ATRIAL FIBRILLATION?:[phone]}         Physical Exam:   VS:  BP (!) 96/54   Pulse 65   Ht 5\' 7"  (1.702 m)   Wt 166 lb 9.6 oz (75.6 kg)   SpO2 98%   BMI 26.09 kg/m    Wt Readings from  Last 3 Encounters:  07/15/23 166 lb 9.6 oz (75.6 kg)  04/17/23 163 lb (73.9 kg)  04/13/23 160 lb 3.2 oz (72.7 kg)    General:  NAD. Neck: No JVD or HJR. Lungs: Clear to auscultation bilaterally without wheezes or crackles. Heart: Regular rate and rhythm with 2/6 systolic murmur. Abdomen: Soft, nontender, nondistended. Extremities: 1+ right and trace left ankle edema.  ASSESSMENT AND PLAN: .    ***    {Are you ordering a CV Procedure (e.g. stress test, cath, DCCV, TEE, etc)?   Press F2        :[id number]}  Dispo: ***  Signed, [name], MD

## 2023-07-15 NOTE — Patient Instructions (Signed)
 Medication Instructions:  Your physician recommends that you continue on your current medications as directed. Please refer to the Current Medication list given to you today.   *If you need a refill on your cardiac medications before your next appointment, please call your pharmacy*  Lab Work: No labs ordered today   Testing/Procedures: Your physician has requested that you have an echocardiogram in 3 months. Echocardiography is a painless test that uses sound waves to create images of your heart. It provides your doctor with information about the size and shape of your heart and how well your heart's chambers and valves are working.   You may receive an ultrasound enhancing agent through an IV if needed to better visualize your heart during the echo. This procedure takes approximately one hour.  There are no restrictions for this procedure.  This will take place at [address] [facility] [address], [place] [address]  Please note: We ask at that you not bring children with you during ultrasound (echo/ vascular) testing. Due to room size and safety concerns, children are not allowed in the ultrasound rooms during exams. Our front office staff cannot provide observation of children in our lobby area while testing is being conducted. An adult accompanying a patient to their appointment will only be allowed in the ultrasound room at the discretion of the ultrasound technician under special circumstances. We apologize for any inconvenience.   Follow-Up: At [facility], you and your health needs are our priority.  As part of our continuing mission to provide you with exceptional heart care, our providers are all part of one team.  This team includes your primary Cardiologist (physician) and Advanced Practice Providers or APPs (Physician Assistants and Nurse Practitioners) who all work together to provide you with the care you need, when you need it.  Your next appointment:    3 month(s)  Provider:   You may see [name], MD or one of the following Advanced Practice Providers on your designated Care Team:   Nicolasa Ducking, NP Ames Dura, PA-C Eula Listen, PA-C Cadence Willoughby, PA-C Charlsie Quest, NP Carlos Levering, NP    We recommend signing up for the patient portal called "MyChart".  Sign up information is provided on this After Visit Summary.  MyChart is used to connect with patients for Virtual Visits (Telemedicine).  Patients are able to view lab/test results, encounter notes, upcoming appointments, etc.  Non-urgent messages can be sent to your provider as well.   To learn more about what you can do with MyChart, go to [email or web address].

## 2023-07-16 ENCOUNTER — Encounter: Payer: Self-pay | Admitting: Internal Medicine

## 2023-07-22 ENCOUNTER — Ambulatory Visit: Admitting: Family Medicine

## 2023-07-29 ENCOUNTER — Other Ambulatory Visit: Payer: Self-pay | Admitting: Internal Medicine

## 2023-07-29 DIAGNOSIS — I25118 Atherosclerotic heart disease of native coronary artery with other forms of angina pectoris: Secondary | ICD-10-CM

## 2023-08-03 NOTE — Progress Notes (Signed)
 Remote ICD transmission.

## 2023-08-06 ENCOUNTER — Ambulatory Visit: Payer: Medicare HMO

## 2023-08-06 DIAGNOSIS — Z0000 Encounter for general adult medical examination without abnormal findings: Secondary | ICD-10-CM

## 2023-08-06 NOTE — Patient Instructions (Signed)
 Jacob Harrison , Thank you for taking time to come for your Medicare Wellness Visit. I appreciate your ongoing commitment to your health goals. Please review the following plan we discussed and let me know if I can assist you in the future.   Referrals/Orders/Follow-Ups/Clinician Recommendations: none  This is a list of the screening recommended for you and due dates:  Health Maintenance  Topic Date Due   Zoster (Shingles) Vaccine (1 of 2) Never done   COVID-19 Vaccine (3 - Pfizer risk series) 06/29/2019   Hemoglobin A1C  05/22/2023   Eye exam for diabetics  07/28/2023   Complete foot exam   08/19/2023   Flu Shot  11/06/2023   Yearly kidney health urinalysis for diabetes  11/19/2023   Yearly kidney function blood test for diabetes  05/03/2024   Medicare Annual Wellness Visit  08/05/2024   DTaP/Tdap/Td vaccine (3 - Td or Tdap) 10/23/2032   Pneumonia Vaccine  Completed   HPV Vaccine  Aged Out   Meningitis B Vaccine  Aged Out   Colon Cancer Screening  Discontinued   Hepatitis C Screening  Discontinued    Advanced directives: (Copy Requested) Please bring a copy of your health care power of attorney and living will to the office to be added to your chart at your convenience. You can mail to [facility] [address] [place], [place] [address] or email to [email or web address]  Next Medicare Annual Wellness Visit scheduled for next year: Yes  insert Preventive Care attachment Insert FALL PREVENTION attachment if needed

## 2023-08-06 NOTE — Progress Notes (Signed)
 Subjective:   Jacob Harrison is a 83 y.o. who presents for a Medicare Wellness preventive visit.  Visit Complete: Virtual I connected with  Jacob Harrison on 08/06/23 by a audio enabled telemedicine application and verified that I am speaking with the correct person using two identifiers.  Patient Location: Home  Provider Location: Home Office  I discussed the limitations of evaluation and management by telemedicine. The patient expressed understanding and agreed to proceed.  Vital Signs: Because this visit was a virtual/telehealth visit, some criteria may be missing or patient reported. Any vitals not documented were not able to be obtained and vitals that have been documented are patient reported.  VideoError- Librarian, academic were attempted between this provider and patient, however failed, due to patient having technical difficulties OR patient did not have access to video capability.  We continued and completed visit with audio only.   Persons Participating in Visit: Patient.  AWV Questionnaire: Yes: Patient Medicare AWV questionnaire was completed by the patient on 08/03/2023; I have confirmed that all information answered by patient is correct and no changes since this date.  Cardiac Risk Factors include: advanced age (>54men, >41 women);diabetes mellitus;dyslipidemia;hypertension     Objective:    Today's Vitals   There is no height or weight on file to calculate BMI.     08/06/2023   10:14 AM 01/13/2023    6:37 AM 01/07/2023   11:04 AM 07/31/2022   10:09 AM 07/05/2022    7:00 PM 07/05/2022    3:11 PM 05/27/2022    9:57 AM  Advanced Directives  Does Patient Have a Medical Advance Directive? Yes Yes Yes Yes Yes Yes Yes  Type of Estate agent of [place];Living will Healthcare Power of [place];Living will Living will;Healthcare Power of [employer] Power of [place];Living will  Healthcare Power of  [place];Living will Healthcare Power of [place];Living will  Does patient want to make changes to medical advance directive?  No - Guardian declined   No - Patient declined    Copy of Healthcare Power of Attorney in Chart? No - copy requested No - copy requested  No - copy requested       Current Medications (verified) Outpatient Encounter Medications as of 08/06/2023  Medication Sig   acetaminophen  (TYLENOL ) 500 MG tablet Take 1,000 mg by mouth as needed for moderate pain or headache.   carvedilol  (COREG ) 12.5 MG tablet Take 0.5 tablets (6.25 mg total) by mouth 2 (two) times daily with a meal.   Cholecalciferol (VITAMIN D -3 PO) Take 800 Units by mouth in the morning.   cyanocobalamin  500 MCG tablet Take 500 mcg by mouth in the morning.   ELIQUIS  5 MG TABS tablet TAKE 1 TABLET BY MOUTH TWICE A DAY   empagliflozin  (JARDIANCE ) 10 MG TABS tablet TAKE 1 TABLET BY MOUTH EVERY DAY BEFORE BREAKFAST   Evolocumab  (REPATHA  SURECLICK) 140 MG/ML SOAJ Inject 140 mg into the skin every 14 (fourteen) days.   finasteride  (PROSCAR ) 5 MG tablet TAKE 1 TABLET (5 MG TOTAL) BY MOUTH DAILY.   furosemide  (LASIX ) 80 MG tablet Take 1 tablet (80 mg total) by mouth 2 (two) times daily.   Glucosamine-Chondroit-Vit C-Mn (GLUCOSAMINE 1500 COMPLEX) CAPS Take 1 capsule by mouth in the morning.   isosorbide  mononitrate (IMDUR ) 30 MG 24 hr tablet TAKE 1 TABLET BY MOUTH 2 TIMES DAILY.   losartan  (COZAAR ) 25 MG tablet Take 12.5 mg by mouth daily in the afternoon.   Multiple Vitamins-Minerals (PRESERVISION  AREDS 2 PO) Take 1 capsule by mouth 2 (two) times a day.   nitroGLYCERIN  (NITROSTAT ) 0.4 MG SL tablet Place 1 tablet (0.4 mg total) under the tongue every 5 (five) minutes as needed for chest pain.   Polyethyl Glycol-Propyl Glycol (LUBRICANT EYE DROPS) 0.4-0.3 % SOLN Place 1-2 drops into both eyes 3 (three) times daily as needed (dry/irritated eyes.).   ranolazine  (RANEXA ) 1000 MG SR tablet TAKE 1 TABLET BY MOUTH TWICE A DAY    tamsulosin (FLOMAX) 0.4 MG CAPS capsule Take 0.4 mg by mouth in the morning.   triamcinolone  cream (KENALOG ) 0.1 % Apply 1 Application topically as directed. Qd up to 5 days per week to itchy rash on body until clear, avoid face, groin, axilla   No facility-administered encounter medications on file as of 08/06/2023.    Allergies (verified) Statins, Other, and Tetracyclines & related   History: Past Medical History:  Diagnosis Date   (HFpEF) heart failure with preserved ejection fraction (HCC)    a. 10/2016 Echo: EF 65-70%, mild LVH, mildly dil LA. RV fxn nl.   Arthritis    BPH (benign prostatic hyperplasia)    Complication of anesthesia    dificult entubation,narrow   Coronary artery disease    a. 2001 CABG x 3 ([place] ): LIMA->LAD, VG->LCX, VG->RPDA; b. 04/05/2010 Cath ([place]): LM 6m, LAD 168m, LCX 50m, RCA 100p, LIMA->LAD atretic, VG->LCX 100, VG->RCA 80ost, 70d; c. 04/2010 Redo CABG x 2 ([place]): VG->LAD, VG->RPDA; d. 10/2012 Cath [facility]): LM 20-30, LAD 100/95-65m, LCX 40-37m, RCA 100p, 80-100d, RPDA 99, RPL1 95, VG->LAD ok, VG->RPDA ok, EF 67%; e. 11/2016 MV: EF 48%, No ischemia.   Diabetes mellitus without complication (HCC)    Type II   Dysrhythmia    Heart murmur    Hyperlipidemia    Hypertension    Ischemic cardiomyopathy    a. 03/2021 s/p MDT Visia AF MRI VR SureScan single lead AICD (ser# [id number]).   Myocardial infarction [facility])    Scoliosis    Spinal stenosis    Past Surgical History:  Procedure Laterality Date   BACK SURGERY  2020   laminectomy L4 L5   CARDIAC CATHETERIZATION     CATARACT EXTRACTION, BILATERAL  10/2022   CORONARY ARTERY BYPASS GRAFT     x 2 (2001 and redo in late 2011 or early 2012)   CYSTOSCOPY WITH URETHRAL DILATATION N/A 01/13/2023   Procedure: CYSTOSCOPY WITH URETHRAL DILATATION;  Surgeon: Adelbert Homans, MD;  Location: [facility] ORS;  Service: Urology;  Laterality: N/A;  30 MINUTES   HERNIA REPAIR Left    inguinal   ICD IMPLANT N/A  03/20/2021   Procedure: ICD IMPLANT;  Surgeon: Boyce Byes, MD;  Location: [facility] INVASIVE CV LAB;  Service: Cardiovascular;  Laterality: N/A;   LEFT HEART CATH AND CORS/GRAFTS ANGIOGRAPHY N/A 10/04/2020   Procedure: LEFT HEART CATH AND CORS/GRAFTS ANGIOGRAPHY;  Surgeon: Arleen Lacer, MD;  Location: [facility] INVASIVE CV LAB;  Service: Cardiovascular;  Laterality: N/A;   LUMBAR LAMINECTOMY/DECOMPRESSION MICRODISCECTOMY N/A 10/20/2018   Procedure: L3-4, L4-5 LUMBAR DECOMPRESSION with dural repair.;  Surgeon: Adah Acron, MD;  Location: [facility] OR;  Service: Orthopedics;  Laterality: N/A;   RIGHT/LEFT HEART CATH AND CORONARY/GRAFT ANGIOGRAPHY N/A 05/27/2022   Procedure: RIGHT/LEFT HEART CATH AND CORONARY/GRAFT ANGIOGRAPHY;  Surgeon: Sammy Crisp, MD;  Location: [facility] INVASIVE CV LAB;  Service: Cardiovascular;  Laterality: N/A;   TONSILLECTOMY AND ADENOIDECTOMY     TRANSURETHRAL RESECTION OF PROSTATE  1990   Family History  Problem  Relation Age of Onset   Diabetes Mother    Hyperlipidemia Mother    Cancer Father        colon   Skin cancer Brother    Alcohol abuse Maternal Uncle    Diabetes Maternal Uncle    Hyperlipidemia Maternal Uncle    Cancer Paternal Uncle        colon   Social History   Socioeconomic History   Marital status: Married    Spouse name: Glenda   Number of children: 2   Years of education: Not on file   Highest education level: Associate degree: occupational, Scientist, product/process development, or vocational program  Occupational History   Occupation: retired  Tobacco Use   Smoking status: Never    Passive exposure: Never   Smokeless tobacco: Never  Vaping Use   Vaping status: Never Used  Substance and Sexual Activity   Alcohol use: Not Currently    Comment: 1-2 drinks on occasion previously 5-7 drinks a week (was 10-15 drinks/week)   Drug use: Never   Sexual activity: Not Currently    Birth control/protection: None  Other Topics Concern   Not on file  Social History Narrative    Not on file   Social Drivers of Health   Financial Resource Strain: Low Risk  (08/06/2023)   Overall Financial Resource Strain (CARDIA)    Difficulty of Paying Living Expenses: Not hard at all  Food Insecurity: No Food Insecurity (08/06/2023)   Hunger Vital Sign    Worried About Running Out of Food in the Last Year: Never true    Ran Out of Food in the Last Year: Never true  Transportation Needs: No Transportation Needs (08/06/2023)   PRAPARE - Administrator, Civil Service (Medical): No    Lack of Transportation (Non-Medical): No  Physical Activity: Insufficiently Active (08/06/2023)   Exercise Vital Sign    Days of Exercise per Week: 3 days    Minutes of Exercise per Session: 20 min  Stress: No Stress Concern Present (08/06/2023)   [employer] of Occupational Health - Occupational Stress Questionnaire    Feeling of Stress : Only a little  Social Connections: Moderately Integrated (08/06/2023)   Social Connection and Isolation Panel [NHANES]    Frequency of Communication with Friends and Family: Twice a week    Frequency of Social Gatherings with Friends and Family: Once a week    Attends Religious Services: Never    Database administrator or Organizations: Yes    Attends Engineer, structural: More than 4 times per year    Marital Status: Married    Tobacco Counseling Counseling given: Not Answered    Clinical Intake:  Pre-visit preparation completed: Yes  Pain : No/denies pain     Nutritional Risks: None Diabetes: Yes CBG done?: No Did pt. bring in CBG monitor from home?: No  Lab Results  Component Value Date   HGBA1C 5.5 11/19/2022   HGBA1C 5.0 08/19/2022   HGBA1C 5.4 05/20/2022     How often do you need to have someone help you when you read instructions, pamphlets, or other written materials from your doctor or pharmacy?: 1 - Never  Interpreter Needed?: No  Information entered by :: NAllen LPN   Activities of Daily Living      08/03/2023    9:52 AM 01/07/2023   11:07 AM  In your present state of health, do you have any difficulty performing the following activities:  Hearing? 0   Vision? 0  Difficulty concentrating or making decisions? 0   Walking or climbing stairs? 1   Dressing or bathing? 1   Comment takes his time   Doing errands, shopping? 0 0  Preparing Food and eating ? Y   Comment wife helps   Using the Toilet? N   In the past six months, have you accidently leaked urine? Y   Comment sporatic   Do you have problems with loss of bowel control? N   Managing your Medications? N   Managing your Finances? N   Housekeeping or managing your Housekeeping? N     Patient Care Team: Noreene Bearded, PA as PCP - General (Family Medicine) End, Veryl Gottron, MD as PCP - Cardiology (Cardiology) Boyce Byes, MD as PCP - Electrophysiology (Cardiology) Dorthey Gave, PA-C as Physician Assistant (Dermatology)  Indicate any recent Medical Services you may have received from other than [facility] providers in the past year (date may be approximate).     Assessment:   This is a routine wellness examination for Jacob Harrison.  Hearing/Vision screen Hearing Screening - Comments:: Denies hearing issues Vision Screening - Comments:: Regular eye exams, Dr. Ambrosio Junker   Goals Addressed             This Visit's Progress    Patient Stated       08/06/2023, try to maintain memory       Depression Screen     08/06/2023   10:17 AM 11/19/2022   10:30 AM 08/19/2022   10:15 AM 07/31/2022   10:03 AM 07/24/2022   10:28 AM 05/20/2022   10:26 AM 05/08/2022   10:01 AM  PHQ 2/9 Scores  PHQ - 2 Score 0 0 0 0 1 0 0  PHQ- 9 Score 1 7 6  0 10 4     Fall Risk     08/03/2023    9:52 AM 08/19/2022   10:16 AM 07/31/2022   10:08 AM 07/30/2022   12:20 PM 05/20/2022   10:27 AM  Fall Risk   Falls in the past year? 1 1 1 1  0  Comment tripping      Number falls in past yr: 1 0 0 0 0  Injury with Fall? 1 0 0 0 0  Comment bruising       Risk for fall due to : Impaired mobility;Impaired balance/gait;History of fall(s);Medication side effect Impaired mobility;Impaired balance/gait No Fall Risks    Follow up Falls prevention discussed;Falls evaluation completed Falls evaluation completed Falls prevention discussed      MEDICARE RISK AT HOME:  Medicare Risk at Home Any stairs in or around the home?: (Patient-Rptd) Yes If so, are there any without handrails?: (Patient-Rptd) No Home free of loose throw rugs in walkways, pet beds, electrical cords, etc?: (Patient-Rptd) Yes Adequate lighting in your home to reduce risk of falls?: (Patient-Rptd) Yes Life alert?: (Patient-Rptd) No Use of a cane, walker or w/c?: (Patient-Rptd) Yes Grab bars in the bathroom?: (Patient-Rptd) Yes Shower chair or bench in shower?: (Patient-Rptd) Yes Elevated toilet seat or a handicapped toilet?: (Patient-Rptd) No  TIMED UP AND GO:  Was the test performed?  No  Cognitive Function: 6CIT completed        08/06/2023   10:18 AM 07/31/2022   10:09 AM 04/10/2021   10:09 AM 04/18/2020   10:44 AM 08/17/2018    8:17 AM  6CIT Screen  What Year? 0 points 0 points 0 points 0 points 0 points  What month? 0 points 0 points 0 points 0  points 0 points  What time? 0 points 0 points 0 points 0 points 0 points  Count back from 20 0 points 0 points 0 points 0 points 0 points  Months in reverse 0 points 0 points 0 points 0 points 0 points  Repeat phrase 0 points 0 points 0 points 0 points 0 points  Total Score 0 points 0 points 0 points 0 points 0 points    Immunizations Immunization History  Administered Date(s) Administered   Fluad Quad(high Dose 65+) 01/07/2021, 02/17/2022   Influenza, High Dose Seasonal PF 02/25/2017, 04/14/2018, 01/06/2019   Influenza-Unspecified 04/08/2016, 01/06/2019, 04/05/2020   PFIZER(Purple Top)SARS-COV-2 Vaccination 05/11/2019, 06/01/2019   Pneumococcal Conjugate-13 04/07/2006   Pneumococcal Polysaccharide-23 11/27/2016   Td  04/07/2014   Tdap 10/24/2022    Screening Tests Health Maintenance  Topic Date Due   Zoster Vaccines- Shingrix (1 of 2) Never done   COVID-19 Vaccine (3 - Pfizer risk series) 06/29/2019   HEMOGLOBIN A1C  05/22/2023   OPHTHALMOLOGY EXAM  07/28/2023   FOOT EXAM  08/19/2023   INFLUENZA VACCINE  11/06/2023   Diabetic kidney evaluation - Urine ACR  11/19/2023   Diabetic kidney evaluation - eGFR measurement  05/03/2024   Medicare Annual Wellness (AWV)  08/05/2024   DTaP/Tdap/Td (3 - Td or Tdap) 10/23/2032   Pneumonia Vaccine 83+ Years old  Completed   HPV VACCINES  Aged Out   Meningococcal B Vaccine  Aged Out   Colonoscopy  Discontinued   Hepatitis C Screening  Discontinued    Health Maintenance  Health Maintenance Due  Topic Date Due   Zoster Vaccines- Shingrix (1 of 2) Never done   COVID-19 Vaccine (3 - Pfizer risk series) 06/29/2019   HEMOGLOBIN A1C  05/22/2023   OPHTHALMOLOGY EXAM  07/28/2023   Health Maintenance Items Addressed: Declines vaccines. States will schedule eye exam  Additional Screening:  Vision Screening: Recommended annual ophthalmology exams for early detection of glaucoma and other disorders of the eye.  Dental Screening: Recommended annual dental exams for proper oral hygiene  Community Resource Referral / Chronic Care Management: CRR required this visit?  No   CCM required this visit?  No     Plan:     I have personally reviewed and noted the following in the patient's chart:   Medical and social history Use of alcohol, tobacco or illicit drugs  Current medications and supplements including opioid prescriptions. Patient is not currently taking opioid prescriptions. Functional ability and status Nutritional status Physical activity Advanced directives List of other physicians Hospitalizations, surgeries, and ER visits in previous 12 months Vitals Screenings to include cognitive, depression, and falls Referrals and appointments  In  addition, I have reviewed and discussed with patient certain preventive protocols, quality metrics, and best practice recommendations. A written personalized care plan for preventive services as well as general preventive health recommendations were provided to patient.     Areatha Beecham, LPN   04/12/1094   After Visit Summary: (MyChart) Due to this being a telephonic visit, the after visit summary with patients personalized plan was offered to patient via MyChart   Notes: Nothing significant to report at this time.

## 2023-08-12 ENCOUNTER — Encounter (HOSPITAL_COMMUNITY): Payer: Self-pay

## 2023-08-19 ENCOUNTER — Encounter: Payer: Self-pay | Admitting: Family Medicine

## 2023-08-19 ENCOUNTER — Ambulatory Visit: Admitting: Family Medicine

## 2023-08-19 VITALS — BP 100/67 | HR 60 | Ht 67.0 in | Wt 167.0 lb

## 2023-08-19 DIAGNOSIS — E119 Type 2 diabetes mellitus without complications: Secondary | ICD-10-CM | POA: Diagnosis not present

## 2023-08-19 DIAGNOSIS — E1169 Type 2 diabetes mellitus with other specified complication: Secondary | ICD-10-CM | POA: Diagnosis not present

## 2023-08-19 DIAGNOSIS — E785 Hyperlipidemia, unspecified: Secondary | ICD-10-CM

## 2023-08-19 DIAGNOSIS — Z7984 Long term (current) use of oral hypoglycemic drugs: Secondary | ICD-10-CM

## 2023-08-19 DIAGNOSIS — F0631 Mood disorder due to known physiological condition with depressive features: Secondary | ICD-10-CM

## 2023-08-19 LAB — POCT GLYCOSYLATED HEMOGLOBIN (HGB A1C): Hemoglobin A1C: 5.6 % (ref 4.0–5.6)

## 2023-08-19 MED ORDER — ESCITALOPRAM OXALATE 10 MG PO TABS: 10.0000 mg | ORAL_TABLET | Freq: Every day | ORAL | 2 refills | Status: DC

## 2023-08-19 NOTE — Patient Instructions (Addendum)
 It was nice to see you today,  We addressed the following topics today: -I have sent in a medication called Lexapro that may help with your mood.  Take it once a day.  You can take it at night so you would be less likely to notice any side effects.  Most common side effects are stomach irritability but these generally improve after 1 to 2 weeks. - Something else you can try is over-the-counter L-Theanine 200mg .  You can take this once a day as needed.  It is a amino acid that people sometimes take for its calming effects. - I would talk to your orthopedist about local injections if they are still an option for you.  You can also ask them if other treatment options like dry needling or shockwave therapy would be appropriate.  It may be contraindicated due to your cardiac defibrillator.  Have a great day,  Etha Henle, MD

## 2023-08-19 NOTE — Progress Notes (Signed)
   Established Patient Office Visit  Subjective   Patient ID: Jacob Harrison, male    DOB: 06-02-1940  Age: 83 y.o. MRN: [id number]  Chief Complaint  Patient presents with   Follow-up    HPI  Subjective - Low back pain bilaterally, worse with standing 15-20 minutes, causing knee weakness - Requires sitting for 4-5 minutes to recover - Stairs particularly painful, needs handrail to pull self up - Reports symptoms worsening recently - No pain when sitting  Medications: acetaminophen  1000-2000mg  daily, Voltaren  gel occasionally, lidocaine  patch, heat patches, Losartan , Tamsulosin (Flomax), Eliquis , Jardiance , Repatha , Lasix  80mg  twice daily, isosorbide   PMH: lumbar stenosis s/p laminectomy 4-5 years ago with good relief until recently, heart failure, atrial fibrillation, 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 Reports SOB with exertion requiring nitroglycerin  use approximately 10 tablets/month, associated with jaw tightness that resolves with nitro. Denies chest pain or SOB at rest.  The ASCVD Risk score (Arnett DK, et al., 2019) failed to calculate for the following reasons:   The 2019 ASCVD risk score is only valid for ages 68 to 64   Risk score cannot be calculated because patient has a medical history suggesting prior/existing ASCVD  Health Maintenance Due  Topic Date Due   Zoster Vaccines- Shingrix (1 of 2) Never done   COVID-19 Vaccine (3 - Pfizer risk series) 06/29/2019   OPHTHALMOLOGY EXAM  07/28/2023   FOOT EXAM  08/19/2023      Objective:     BP 100/67   Pulse 60   Ht 5\' 7"  (1.702 m)   Wt 167 lb (75.8 kg)   SpO2 96%   BMI 26.16 kg/m    Physical Exam General:  alert, Oriented Pulmonary: No respiratory distress Psych: Pleasant affect.       Assessment & Plan:   Type 2 diabetes mellitus without complication, without long-term current use of insulin  (HCC) Assessment & Plan: Getting uacr and A1c today.  Continue jardiance   Orders: -     Microalbumin / creatinine urine ratio -     POCT glycosylated hemoglobin (Hb A1C)  Hyperlipidemia associated with type 2 diabetes mellitus (HCC) Assessment & Plan: Contrandication to statins and zetia  d/t statin induced rhabdomyolysis. On repatha .    Depression due to physical illness Assessment & Plan: Pt feeling depressed d/t inability to be more active because of his chronic low back pain.  Sending in lexapro  prescription.  Recommended he can try otc options like l theanine as well.  Recommended he talk with his orthopedist regarding other options for pain mgmt   Other orders -     Escitalopram  Oxalate; Take 1 tablet (10 mg total) by mouth daily.  Dispense: 30 tablet; Refill: 2     Return in about 4 weeks (around 09/16/2023) for mood.    Laneta Pintos, MD

## 2023-08-20 LAB — MICROALBUMIN / CREATININE URINE RATIO
Creatinine, Urine: 38.4 mg/dL
Microalb/Creat Ratio: 21 mg/g{creat} (ref 0–29)
Microalbumin, Urine: 8.1 ug/mL

## 2023-08-21 ENCOUNTER — Ambulatory Visit: Payer: Self-pay | Admitting: Family Medicine

## 2023-08-24 DIAGNOSIS — F0631 Mood disorder due to known physiological condition with depressive features: Secondary | ICD-10-CM | POA: Insufficient documentation

## 2023-08-24 NOTE — Assessment & Plan Note (Signed)
 Pt feeling depressed d/t inability to be more active because of his chronic low back pain.  Sending in lexapro  prescription.  Recommended he can try otc options like l theanine as well.  Recommended he talk with his orthopedist regarding other options for pain mgmt

## 2023-08-24 NOTE — Assessment & Plan Note (Signed)
 Contrandication to statins and zetia  d/t statin induced rhabdomyolysis. On repatha .

## 2023-08-24 NOTE — Assessment & Plan Note (Signed)
 Getting uacr and A1c today.  Continue jardiance 

## 2023-08-26 ENCOUNTER — Other Ambulatory Visit (INDEPENDENT_AMBULATORY_CARE_PROVIDER_SITE_OTHER)

## 2023-08-26 ENCOUNTER — Ambulatory Visit: Admitting: Orthopaedic Surgery

## 2023-08-26 ENCOUNTER — Encounter: Payer: Self-pay | Admitting: Orthopaedic Surgery

## 2023-08-26 DIAGNOSIS — M545 Low back pain, unspecified: Secondary | ICD-10-CM

## 2023-08-26 NOTE — Progress Notes (Addendum)
 Office Visit Note   Patient: Jacob Harrison           Date of Birth: [birth year]           MRN: [id number] Visit Date: 08/26/2023              Requested by: Noreene Bearded, PA [address] [place],  [place] [address] PCP: Noreene Bearded, PA   Assessment & Plan: Visit Diagnoses:  1. Acute bilateral low back pain, unspecified whether sciatica present     Plan: Patient with worsening lower back pain as well as some generalized lower extremity weakness.  Lower extremity weakness could be related to general deconditioning given his cardiomyopathy as patient has limited capacity for walking.  Will go ahead and resend referral to Dr. Daisey Dryer at this time for possible ESI versus radiofrequency ablation.  Patient understanding and agreeable with plan  Follow-Up Instructions: No follow-ups on file.   Orders:  Orders Placed This Encounter  Procedures   XR Lumbar Spine 2-3 Views   Ambulatory referral to Physical Medicine Rehab   No orders of the defined types were placed in this encounter.   Subjective: Chief Complaint  Patient presents with   Lower Back - Pain    Patient is presenting for lower back pain has been persisting for the past few years.  Patient notes some bilateral leg weakness and does not use a cane.  Patient was previously seen by Dr. Murrel Arnt.  Patient did have surgery done by Dr. Murrel Arnt on 10/20/2018 where he had a lumbar decompression of L3/4/5.  Patient has no pain with sitting and most of his pain occurs when ambulating.  Patient also notes some numbness of the lower extremities. Patient's main complaint at this time is localized back pain of the lower back today.  Patient notes improvement in his pain when he leans forward while walking. Patient does have a history of cardiomyopathy requiring pacemaker and defibrillator as well.    Review of Systems  Constitutional: Negative.   HENT: Negative.    Eyes: Negative.   Respiratory: Negative.    Cardiovascular:  Negative.   Gastrointestinal: Negative.   Endocrine: Negative.   Genitourinary: Negative.   Musculoskeletal:  Positive for back pain.  Skin: Negative.   Allergic/Immunologic: Negative.   Neurological: Negative.   Hematological: Negative.   Psychiatric/Behavioral: Negative.    All other systems reviewed and are negative.    Objective: Vital Signs: There were no vitals taken for this visit.  Physical Exam Vitals and nursing note reviewed.  Constitutional:      Appearance: He is well-developed.  HENT:     Head: Normocephalic and atraumatic.  Eyes:     Pupils: Pupils are equal, round, and reactive to light.  Pulmonary:     Effort: Pulmonary effort is normal.  Abdominal:     Palpations: Abdomen is soft.  Musculoskeletal:        General: Normal range of motion.     Cervical back: Neck supple.  Skin:    General: Skin is warm.  Neurological:     Mental Status: He is alert and oriented to person, place, and time.  Psychiatric:        Behavior: Behavior normal.        Thought Content: Thought content normal.        Judgment: Judgment normal.     Ortho Exam Tenderness to palpation over the lumbar spine spinous processes and paraspinally.  Patient noted to be in forward  flexion at baseline while standing.  Modified straight leg test is negative.  Strength is at baseline of the lower extremities with flexion extension of the knee against resistance.  Specialty Comments:  No specialty comments available.  Imaging: XR Lumbar Spine 2-3 Views Result Date: 08/26/2023 X-rays of the lumbar spine show diffuse degenerative changes.  Degenerative scoliosis.  No acute abnormalities.    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   Muscle weakness 11/19/2022   Statins contraindicated 10/16/2022   Paroxysmal atrial fibrillation (HCC) 08/27/2022   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)  08/27/2022   ICD (implantable cardioverter-defibrillator) in place 08/05/2022   Statin-induced rhabdomyolysis 07/05/2022   Transaminitis 07/05/2022   Chronic low back pain 07/02/2022   Dyspnea on exertion 05/22/2022   Chronic HFrEF (heart failure with reduced ejection fraction) (HCC) 12/13/2021   Acute on chronic heart failure with preserved ejection fraction (HFpEF) (HCC) 10/08/2020   Non-ST elevation (NSTEMI) myocardial infarction (HCC) 10/08/2020   CHF (congestive heart failure) (HCC) 10/04/2020   Hx of CABG    Accelerating angina (HCC) 10/03/2020   Rotator cuff disorder 08/09/2019   BPH associated with nocturia 11/29/2018   Status post lumbar spine surgery for decompression of spinal cord 10/29/2018   Spinal stenosis of lumbar region 08/02/2018   Atypical mole 07/30/2017   Sinus bradycardia 03/18/2017   Ventral hernia without obstruction or gangrene 02/25/2017   (HFpEF) heart failure with preserved ejection fraction (HCC) 09/28/2016   Ischemic cardiomyopathy 09/28/2016   Vitamin D  deficiency 06/27/2016   Vitamin B deficiency 06/27/2016   Type 2 diabetes mellitus without complication, without long-term current use of insulin  (HCC) 05/20/2016   Essential hypertension 05/20/2016   Hyperlipidemia associated with type 2 diabetes mellitus (HCC) 05/20/2016   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 10/2016 Echo: EF 65-70%, mild LVH, mildly dil LA. RV fxn nl.   Arthritis    BPH (benign prostatic hyperplasia)    Complication of anesthesia    dificult entubation,narrow   Coronary artery disease    a. 2001 CABG x 3 ([place] ): LIMA->LAD, VG->LCX, VG->RPDA; b. 04/05/2010 Cath ([place]): LM 36m, LAD 170m, LCX 19m, RCA 100p, LIMA->LAD atretic, VG->LCX 100, VG->RCA 80ost, 70d; c. 04/2010 Redo CABG x 2 ([place]): VG->LAD, VG->RPDA; d. 10/2012  Cath [facility]): LM 20-30, LAD 100/95-69m, LCX 40-71m, RCA 100p, 80-100d, RPDA 99, RPL1 95, VG->LAD ok, VG->RPDA ok, EF 67%; e. 11/2016 MV: EF 48%, No ischemia.   Diabetes mellitus without complication (HCC)    Type II   Dysrhythmia    Heart murmur    Hyperlipidemia    Hypertension    Ischemic cardiomyopathy    a. 03/2021 s/p MDT Visia AF MRI VR SureScan single lead AICD (ser# [id number]).   Myocardial infarction [facility])    Scoliosis    Spinal stenosis     Family History  Problem Relation Age of Onset   Diabetes Mother    Hyperlipidemia Mother    Cancer Father        colon   Skin cancer Brother    Alcohol abuse Maternal Uncle    Diabetes Maternal Uncle    Hyperlipidemia Maternal Uncle    Cancer Paternal Uncle        colon    Past Surgical History:  Procedure Laterality Date   BACK SURGERY  2020   laminectomy L4 L5   CARDIAC CATHETERIZATION     CATARACT EXTRACTION, BILATERAL  10/2022   CORONARY ARTERY BYPASS GRAFT     x 2 (2001 and redo in late 2011 or early 2012)   CYSTOSCOPY WITH URETHRAL DILATATION N/A 01/13/2023   Procedure: CYSTOSCOPY WITH URETHRAL DILATATION;  Surgeon: Adelbert Homans, MD;  Location: [facility] ORS;  Service: Urology;  Laterality: N/A;  30 MINUTES   HERNIA REPAIR Left    inguinal   ICD IMPLANT N/A 03/20/2021   Procedure: ICD IMPLANT;  Surgeon: Boyce Byes, MD;  Location: [facility] INVASIVE CV LAB;  Service: Cardiovascular;  Laterality: N/A;   LEFT HEART CATH AND CORS/GRAFTS ANGIOGRAPHY N/A 10/04/2020   Procedure: LEFT HEART CATH AND CORS/GRAFTS ANGIOGRAPHY;  Surgeon: Arleen Lacer, MD;  Location: [facility] INVASIVE CV LAB;  Service: Cardiovascular;  Laterality: N/A;   LUMBAR LAMINECTOMY/DECOMPRESSION MICRODISCECTOMY N/A 10/20/2018   Procedure: L3-4, L4-5 LUMBAR DECOMPRESSION with dural repair.;  Surgeon: Adah Acron, MD;  Location: [facility] OR;  Service: Orthopedics;  Laterality: N/A;   RIGHT/LEFT HEART CATH AND CORONARY/GRAFT ANGIOGRAPHY N/A 05/27/2022    Procedure: RIGHT/LEFT HEART CATH AND CORONARY/GRAFT ANGIOGRAPHY;  Surgeon: Sammy Crisp, MD;  Location: [facility] INVASIVE CV LAB;  Service: Cardiovascular;  Laterality: N/A;   TONSILLECTOMY AND ADENOIDECTOMY     TRANSURETHRAL RESECTION OF PROSTATE  1990   Social History   Occupational History   Occupation: retired  Tobacco Use   Smoking status: Never    Passive exposure: Never   Smokeless tobacco: Never  Vaping Use   Vaping status: Never Used  Substance and Sexual Activity   Alcohol use: Not Currently    Comment: 1-2 drinks on occasion previously 5-7 drinks a week (was 10-15 drinks/week)   Drug use: Never   Sexual activity: Not Currently    Birth control/protection: None

## 2023-08-27 ENCOUNTER — Other Ambulatory Visit: Payer: Self-pay | Admitting: Family Medicine

## 2023-08-27 DIAGNOSIS — N401 Enlarged prostate with lower urinary tract symptoms: Secondary | ICD-10-CM

## 2023-09-07 ENCOUNTER — Encounter: Payer: Self-pay | Admitting: Internal Medicine

## 2023-09-09 ENCOUNTER — Ambulatory Visit: Admitting: Physical Medicine and Rehabilitation

## 2023-09-09 ENCOUNTER — Encounter: Payer: Self-pay | Admitting: Physical Medicine and Rehabilitation

## 2023-09-09 DIAGNOSIS — M5416 Radiculopathy, lumbar region: Secondary | ICD-10-CM

## 2023-09-09 DIAGNOSIS — M5441 Lumbago with sciatica, right side: Secondary | ICD-10-CM | POA: Diagnosis not present

## 2023-09-09 DIAGNOSIS — R202 Paresthesia of skin: Secondary | ICD-10-CM

## 2023-09-09 DIAGNOSIS — R269 Unspecified abnormalities of gait and mobility: Secondary | ICD-10-CM

## 2023-09-09 DIAGNOSIS — M5442 Lumbago with sciatica, left side: Secondary | ICD-10-CM | POA: Diagnosis not present

## 2023-09-09 DIAGNOSIS — M961 Postlaminectomy syndrome, not elsewhere classified: Secondary | ICD-10-CM | POA: Diagnosis not present

## 2023-09-09 DIAGNOSIS — G8929 Other chronic pain: Secondary | ICD-10-CM

## 2023-09-09 NOTE — Progress Notes (Signed)
 Pain Scale   Average Pain 5 Patient advised he has lower back pain and it makes his legs weak.        +Driver, -BT, -Dye Allergies.

## 2023-09-09 NOTE — Progress Notes (Signed)
 Core Outcome Measures Index (COMI) Back Score  Average Pain 7  COMI Score 70%

## 2023-09-09 NOTE — Progress Notes (Signed)
 Jacob Harrison - 83 y.o. male MRN [id number]  Date of birth: [birth year]  Office Visit Note: Visit Date: 09/09/2023 PCP: Noreene Bearded, PA Referred by: Noreene Bearded, PA  Subjective: Chief Complaint  Patient presents with   Lower Back - Pain   HPI: Jacob Harrison is a 83 y.o. male who comes in today per the request of Dr. Claria Crofts for evaluation of chronic, worsening and severe bilateral lower back pain radiating to buttocks. Also reports numbness and weakness to bilateral legs with prolonged standing. He is long time patient of Dr. Tommy Frames. Pain ongoing for several years, worsened about 6 months ago. Prolonged standing and walking seem to cause severe pain. He describes pain as sore, aching and numb sensation, currently rates as 8 out of 10. Some relief of pain with home exercise regimen, rest and use of medications. History of formal physical therapy several years ago with minimal relief of pain. Lumbar MRI imaging from 2019 shows severe spinal canal stenosis at L3-L4 and L4-L5, there is also marked lateral recess narrowing at these levels. History of L3-L4 and L4-L5 lumbar decompression with Dr. Murrel Arnt in 2020. He did undergo several epidural steroid injections with Dr. Daisey Dryer prior to surgery, he reports significant relief of pain with these procedures. He is currently using cane to assist with ambulation. No recent trauma or falls.   His course is complicated by CHF, atrial fibrillation and diabetes mellitus. He is currently taking Eliquis .       Review of Systems  Musculoskeletal:  Positive for back pain.  Neurological:  Positive for tingling and weakness.  All other systems reviewed and are negative.  Otherwise per HPI.  Assessment & Plan: Visit Diagnoses:    ICD-10-CM   1. Chronic bilateral low back pain with bilateral sciatica  M54.42 MR LUMBAR SPINE WO CONTRAST   M54.41    G89.29     2. Lumbar radiculopathy  M54.16 MR LUMBAR SPINE WO CONTRAST     3. 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 Gait abnormality  R26.9 MR LUMBAR SPINE WO CONTRAST       Plan: Findings:  Chronic, worsening and severe bilateral lower back pain radiating to buttocks. Feeling of weakness and numbness to bilateral lower extremities with standing. He continues to have severe pain despite good conservative therapies such as formal physical therapy, home exercise regimen, rest and use of medications. Patients clinical presentation and exam are consistent with lumbar radiculopathy. His pain does seem to fit more with neurogenic claudication. I am concerned about worsening central canal stenosis. I placed order for new lumbar MRI imaging today. Depending on results of MRI imaging we discussed possibility of performing lumbar epidural steroid injections. I am not certain he would be ideal surgical candidate at this point due to significant cardiac history. His exam today is non focal, good strength noted to bilateral lower extremities, no foot drop noted. He has no questions at this time. I will see him back for lumbar MRI review. No red flag symptoms noted upon exam today.     Meds & Orders: No orders of the defined types were placed in this encounter.   Orders Placed This Encounter  Procedures   MR LUMBAR SPINE WO CONTRAST    Follow-up: Return for Lumbar MRI Imaging.   Procedures: No procedures performed      Clinical  History: Study Result  Narrative & Impression CLINICAL DATA:  Chronic low back pain   EXAM: MRI LUMBAR SPINE WITHOUT CONTRAST   TECHNIQUE: Multiplanar, multisequence MR imaging of the lumbar spine was performed. No intravenous contrast was administered.   COMPARISON:  Lumbar spine radiographs 09/01/2017   FINDINGS: Segmentation:  Normal   Alignment:  Normal   Vertebrae:  Negative for fracture or mass.  Normal bone marrow.   Conus medullaris and  cauda equina: Conus extends to the L1-2 level. Conus and cauda equina appear normal.   Paraspinal and other soft tissues: Negative for paraspinous mass or soft tissue swelling   Disc levels:   T12-L1: Negative   L1-2: Mild disc and mild facet degeneration with mild spinal stenosis   L2-3: Moderate disc bulging and mild facet hypertrophy. Moderately severe spinal stenosis. Neural foramina patent bilaterally. Subarticular stenosis left greater than right   L3-4: Diffuse bulging of the disc with endplate spurring. Moderate facet and ligamentum flavum hypertrophy causing severe spinal stenosis. Marked subarticular stenosis bilaterally.   L4-5: Severe spinal stenosis. Diffuse disc bulging and endplate spurring right greater than left. Endplate edema on the right. Moderate facet and ligamentum flavum hypertrophy bilaterally. Marked subarticular stenosis bilaterally.   L5-S1: Large central disc and osteophyte complex with associated disc space narrowing. This appears chronic. There is displacement of the right S1 nerve root which is not compressed. Subarticular stenosis due to spurring bilaterally.   IMPRESSION: Extensive multilevel lumbar degenerative change.   Mild spinal stenosis L2-3   Moderately severe spinal stenosis L2-3 with subarticular stenosis left greater than right   Severe spinal stenosis L3-4 with marked subarticular stenosis bilaterally   Severe spinal stenosis L4-5 with marked subarticular stenosis bilaterally   Large central disc and osteophyte complex with subarticular stenosis bilaterally. Mild displacement of the right S1 nerve root.     Electronically Signed   By: Anastasio Balsam M.D.   On: 09/22/2017 10:44   He reports that he has never smoked. He has never been exposed to tobacco smoke. He has never used smokeless tobacco.  Recent Labs    11/19/22 1035 08/19/23 1149  HGBA1C 5.5 5.6    Objective:  VS:  HT:    WT:   BMI:     BP:   HR: bpm   TEMP: ( )  RESP:  Physical Exam Vitals and nursing note reviewed.  HENT:     Head: Normocephalic and atraumatic.     Right Ear: External ear normal.     Left Ear: External ear normal.     Nose: Nose normal.     Mouth/Throat:     Mouth: Mucous membranes are moist.  Eyes:     Extraocular Movements: Extraocular movements intact.  Cardiovascular:     Rate and Rhythm: Normal rate.     Pulses: Normal pulses.  Pulmonary:     Effort: Pulmonary effort is normal.  Abdominal:     General: Abdomen is flat. There is no distension.  Musculoskeletal:        General: Tenderness present.     Cervical back: Normal range of motion.     Comments: Patient is slow to rise from seated position to standing. Good lumbar range of motion. No pain noted with facet loading. 5/5 strength noted with bilateral hip flexion, knee flexion/extension, ankle dorsiflexion/plantarflexion and EHL. No clonus noted bilaterally. No pain upon palpation of greater trochanters. No pain with internal/external rotation of bilateral hips. Sensation intact bilaterally. Negative slump test bilaterally. Ambulates  with cane, gait slow and unsteady.  Skin:    General: Skin is warm and dry.     Capillary Refill: Capillary refill takes less than 2 seconds.  Neurological:     Mental Status: He is alert and oriented to person, place, and time.     Gait: Gait abnormal.  Psychiatric:        Mood and Affect: Mood normal.        Behavior: Behavior normal.     Ortho Exam  Imaging: No results found.  Past Medical/Family/Surgical/Social History: Medications & Allergies reviewed per EMR, new medications updated. 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   Muscle weakness 11/19/2022   Statins contraindicated 10/16/2022   Paroxysmal atrial fibrillation (HCC) 08/27/2022   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) 08/27/2022   ICD (implantable  cardioverter-defibrillator) in place 08/05/2022   Statin-induced rhabdomyolysis 07/05/2022   Transaminitis 07/05/2022   Chronic low back pain 07/02/2022   Dyspnea on exertion 05/22/2022   Chronic HFrEF (heart failure with reduced ejection fraction) (HCC) 12/13/2021   Acute on chronic heart failure with preserved ejection fraction (HFpEF) (HCC) 10/08/2020   Non-ST elevation (NSTEMI) myocardial infarction (HCC) 10/08/2020   CHF (congestive heart failure) (HCC) 10/04/2020   Hx of CABG    Accelerating angina (HCC) 10/03/2020   Rotator cuff disorder 08/09/2019   BPH associated with nocturia 11/29/2018   Status post lumbar spine surgery for decompression of spinal cord 10/29/2018   Spinal stenosis of lumbar region 08/02/2018   Atypical mole 07/30/2017   Sinus bradycardia 03/18/2017   Ventral hernia without obstruction or gangrene 02/25/2017   (HFpEF) heart failure with preserved ejection fraction (HCC) 09/28/2016   Ischemic cardiomyopathy 09/28/2016   Vitamin D  deficiency 06/27/2016   Vitamin B deficiency 06/27/2016   Type 2 diabetes mellitus without complication, without long-term current use of insulin  (HCC) 05/20/2016   Essential hypertension 05/20/2016   Hyperlipidemia associated with type 2 diabetes mellitus (HCC) 05/20/2016   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 10/2016 Echo: EF 65-70%, mild LVH, mildly dil LA. RV fxn nl.   Arthritis    BPH (benign prostatic hyperplasia)    Complication of anesthesia    dificult entubation,narrow   Coronary artery disease    a. 2001 CABG x 3 ([place] ): LIMA->LAD, VG->LCX, VG->RPDA; b. 04/05/2010 Cath ([place]): LM 5m, LAD 131m, LCX 3m, RCA 100p, LIMA->LAD atretic, VG->LCX 100, VG->RCA 80ost, 70d; c. 04/2010 Redo CABG x 2 ([place]): VG->LAD, VG->RPDA; d. 10/2012 Cath [facility]): LM 20-30, LAD  100/95-92m, LCX 40-32m, RCA 100p, 80-100d, RPDA 99, RPL1 95, VG->LAD ok, VG->RPDA ok, EF 67%; e. 11/2016 MV: EF 48%, No ischemia.   Diabetes mellitus without complication (HCC)    Type II   Dysrhythmia    Heart murmur    Hyperlipidemia    Hypertension    Ischemic cardiomyopathy    a. 03/2021 s/p MDT Visia AF MRI VR SureScan single lead AICD (ser# [id number]).   Myocardial infarction [facility])    Scoliosis    Spinal stenosis    Family History  Problem Relation Age of Onset   Diabetes Mother    Hyperlipidemia Mother    Cancer Father        colon   Skin cancer Brother    Alcohol abuse Maternal  Uncle    Diabetes Maternal Uncle    Hyperlipidemia Maternal Uncle    Cancer Paternal Uncle        colon   Past Surgical History:  Procedure Laterality Date   BACK SURGERY  2020   laminectomy L4 L5   CARDIAC CATHETERIZATION     CATARACT EXTRACTION, BILATERAL  10/2022   CORONARY ARTERY BYPASS GRAFT     x 2 (2001 and redo in late 2011 or early 2012)   CYSTOSCOPY WITH URETHRAL DILATATION N/A 01/13/2023   Procedure: CYSTOSCOPY WITH URETHRAL DILATATION;  Surgeon: Adelbert Homans, MD;  Location: [facility] ORS;  Service: Urology;  Laterality: N/A;  30 MINUTES   HERNIA REPAIR Left    inguinal   ICD IMPLANT N/A 03/20/2021   Procedure: ICD IMPLANT;  Surgeon: Boyce Byes, MD;  Location: [facility] INVASIVE CV LAB;  Service: Cardiovascular;  Laterality: N/A;   LEFT HEART CATH AND CORS/GRAFTS ANGIOGRAPHY N/A 10/04/2020   Procedure: LEFT HEART CATH AND CORS/GRAFTS ANGIOGRAPHY;  Surgeon: Arleen Lacer, MD;  Location: [facility] INVASIVE CV LAB;  Service: Cardiovascular;  Laterality: N/A;   LUMBAR LAMINECTOMY/DECOMPRESSION MICRODISCECTOMY N/A 10/20/2018   Procedure: L3-4, L4-5 LUMBAR DECOMPRESSION with dural repair.;  Surgeon: Adah Acron, MD;  Location: [facility] OR;  Service: Orthopedics;  Laterality: N/A;   RIGHT/LEFT HEART CATH AND CORONARY/GRAFT ANGIOGRAPHY N/A 05/27/2022   Procedure: RIGHT/LEFT HEART CATH  AND CORONARY/GRAFT ANGIOGRAPHY;  Surgeon: Sammy Crisp, MD;  Location: [facility] INVASIVE CV LAB;  Service: Cardiovascular;  Laterality: N/A;   TONSILLECTOMY AND ADENOIDECTOMY     TRANSURETHRAL RESECTION OF PROSTATE  1990   Social History   Occupational History   Occupation: retired  Tobacco Use   Smoking status: Never    Passive exposure: Never   Smokeless tobacco: Never  Vaping Use   Vaping status: Never Used  Substance and Sexual Activity   Alcohol use: Not Currently    Comment: 1-2 drinks on occasion previously 5-7 drinks a week (was 10-15 drinks/week)   Drug use: Never   Sexual activity: Not Currently    Birth control/protection: None

## 2023-09-11 ENCOUNTER — Other Ambulatory Visit: Payer: Self-pay | Admitting: Family Medicine

## 2023-09-14 ENCOUNTER — Encounter: Payer: Self-pay | Admitting: Physical Medicine and Rehabilitation

## 2023-09-15 LAB — CUP PACEART REMOTE DEVICE CHECK
Battery Remaining Longevity: 112 mo
Battery Voltage: 3.02 V
Brady Statistic RV Percent Paced: 0.25 %
Date Time Interrogation Session: 20250609095818
HighPow Impedance: 67 Ohm
Implantable Lead Connection Status: 753985
Implantable Lead Implant Date: 20221214
Implantable Lead Location: 753860
Implantable Pulse Generator Implant Date: 20221214
Lead Channel Impedance Value: 266 Ohm
Lead Channel Impedance Value: 437 Ohm
Lead Channel Pacing Threshold Amplitude: 0.75 V
Lead Channel Pacing Threshold Pulse Width: 0.4 ms
Lead Channel Sensing Intrinsic Amplitude: 12.75 mV
Lead Channel Sensing Intrinsic Amplitude: 12.75 mV
Lead Channel Setting Pacing Amplitude: 1.5 V
Lead Channel Setting Pacing Pulse Width: 0.4 ms
Lead Channel Setting Sensing Sensitivity: 0.3 mV
Zone Setting Status: 755011
Zone Setting Status: 755011

## 2023-09-16 ENCOUNTER — Ambulatory Visit (INDEPENDENT_AMBULATORY_CARE_PROVIDER_SITE_OTHER): Payer: Medicare HMO

## 2023-09-16 DIAGNOSIS — I255 Ischemic cardiomyopathy: Secondary | ICD-10-CM

## 2023-09-18 ENCOUNTER — Ambulatory Visit: Payer: Self-pay | Admitting: Cardiology

## 2023-09-20 ENCOUNTER — Other Ambulatory Visit: Payer: Self-pay | Admitting: Internal Medicine

## 2023-09-22 ENCOUNTER — Encounter: Payer: Self-pay | Admitting: Family Medicine

## 2023-09-22 ENCOUNTER — Encounter: Payer: Self-pay | Admitting: Internal Medicine

## 2023-09-22 ENCOUNTER — Ambulatory Visit (INDEPENDENT_AMBULATORY_CARE_PROVIDER_SITE_OTHER): Admitting: Family Medicine

## 2023-09-22 VITALS — BP 90/58 | HR 62 | Ht 67.0 in | Wt 170.0 lb

## 2023-09-22 DIAGNOSIS — Z7984 Long term (current) use of oral hypoglycemic drugs: Secondary | ICD-10-CM | POA: Diagnosis not present

## 2023-09-22 DIAGNOSIS — E119 Type 2 diabetes mellitus without complications: Secondary | ICD-10-CM | POA: Diagnosis not present

## 2023-09-22 DIAGNOSIS — F0631 Mood disorder due to known physiological condition with depressive features: Secondary | ICD-10-CM | POA: Diagnosis not present

## 2023-09-22 DIAGNOSIS — I10 Essential (primary) hypertension: Secondary | ICD-10-CM | POA: Diagnosis not present

## 2023-09-22 DIAGNOSIS — E785 Hyperlipidemia, unspecified: Secondary | ICD-10-CM

## 2023-09-22 DIAGNOSIS — E1169 Type 2 diabetes mellitus with other specified complication: Secondary | ICD-10-CM

## 2023-09-22 NOTE — Assessment & Plan Note (Signed)
-   Currently on escitalopram  10 mg at bedtime    - Medication working well with no side effects    - Mood improved, no longer experiencing anger outbursts    - Continue current dose of escitalopram  10 mg at bedtime    - Potential to increase to 20 mg  in future if needed

## 2023-09-22 NOTE — Patient Instructions (Signed)
 It was nice to see you today,  We addressed the following topics today: -No changes today. - Continue your escitalopram .   Have a great day,  Etha Henle, MD

## 2023-09-22 NOTE — Progress Notes (Signed)
   Established Patient Office Visit  Subjective   Patient ID: Jacob Harrison, male    DOB: 05-14-1940  Age: 83 y.o. MRN: [id number]  Chief Complaint  Patient presents with   Medical Management of Chronic Issues    HPI  Subjective - leg pain: continues to be the same, going upstairs is a struggle. Has upcoming MRI of low back - Mood: escitalopram  working well, no longer gets upset quickly, wife has noticed improvement - No new concerns or issues  Medications: escitalopram  10 mg at bedtime for mood, no side effects  PMH, PSH, FH, Social Hx: none mentioned  ROS: negative for medication side effects   The ASCVD Risk score (Arnett DK, et al., 2019) failed to calculate for the following reasons:   The 2019 ASCVD risk score is only valid for ages 64 to 44   Risk score cannot be calculated because patient has a medical history suggesting prior/existing ASCVD  Health Maintenance Due  Topic Date Due   Zoster Vaccines- Shingrix (1 of 2) Never done   COVID-19 Vaccine (3 - Pfizer risk series) 06/29/2019   OPHTHALMOLOGY EXAM  07/28/2023   FOOT EXAM  08/19/2023      Objective:     BP (!) 90/58   Pulse 62   Ht 5' 7 (1.702 m)   Wt 170 lb (77.1 kg)   SpO2 95%   BMI 26.63 kg/m    Physical Exam Gen: alert, oriented Pulm: no resp distress Psych: pleasant affect   No results found for any visits on 09/22/23.      Assessment & Plan:   Depression due to physical illness Assessment & Plan:    - Currently on escitalopram  10 mg at bedtime    - Medication working well with no side effects    - Mood improved, no longer experiencing anger outbursts    - Continue current dose of escitalopram  10 mg at bedtime    - Potential to increase to 20 mg  in future if needed   Essential hypertension  Hyperlipidemia associated with type 2 diabetes mellitus (HCC) -     Comprehensive metabolic panel with GFR; Future -     Lipid panel; Future  Type 2 diabetes mellitus without  complication, without long-term current use of insulin  (HCC) -     Comprehensive metabolic panel with GFR; Future -     Hemoglobin A1c; Future     Return in about 4 months (around 01/22/2024) for physical.    Laneta Pintos, MD

## 2023-09-30 NOTE — CV Procedure (Signed)
  Device system confirmed to be MRI conditional, with implant date > 6 weeks ago, and no evidence of abandoned or epicardial leads in review of most recent CXR  Device last cleared by EP Provider: Suzann Riddle 09/30/23  Clearance is good through for 1 year as long as parameters remain stable at time of check. If pt undergoes a cardiac device procedure during that time, they should be re-cleared.   Tachy-therapies to be programmed off if applicable with device back to pre-MRI settings after completion of exam.  [employer] - Programming recommendation received through [employer] App/Tablet  [name], RT  09/30/2023 10:14 AM

## 2023-10-07 ENCOUNTER — Ambulatory Visit (HOSPITAL_COMMUNITY)
Admission: RE | Admit: 2023-10-07 | Discharge: 2023-10-07 | Disposition: A | Discharge status: Home / Self Care | Source: Ambulatory Visit | Attending: Physical Medicine and Rehabilitation | Admitting: Physical Medicine and Rehabilitation

## 2023-10-07 DIAGNOSIS — M47816 Spondylosis without myelopathy or radiculopathy, lumbar region: Secondary | ICD-10-CM | POA: Diagnosis not present

## 2023-10-07 DIAGNOSIS — M961 Postlaminectomy syndrome, not elsewhere classified: Secondary | ICD-10-CM | POA: Diagnosis not present

## 2023-10-07 DIAGNOSIS — R202 Paresthesia of skin: Secondary | ICD-10-CM | POA: Insufficient documentation

## 2023-10-07 DIAGNOSIS — G8929 Other chronic pain: Secondary | ICD-10-CM | POA: Insufficient documentation

## 2023-10-07 DIAGNOSIS — M48061 Spinal stenosis, lumbar region without neurogenic claudication: Secondary | ICD-10-CM | POA: Diagnosis not present

## 2023-10-07 DIAGNOSIS — M5441 Lumbago with sciatica, right side: Secondary | ICD-10-CM | POA: Insufficient documentation

## 2023-10-07 DIAGNOSIS — R269 Unspecified abnormalities of gait and mobility: Secondary | ICD-10-CM | POA: Insufficient documentation

## 2023-10-07 DIAGNOSIS — M5416 Radiculopathy, lumbar region: Secondary | ICD-10-CM | POA: Insufficient documentation

## 2023-10-07 DIAGNOSIS — M5442 Lumbago with sciatica, left side: Secondary | ICD-10-CM | POA: Diagnosis not present

## 2023-10-07 DIAGNOSIS — M5126 Other intervertebral disc displacement, lumbar region: Secondary | ICD-10-CM | POA: Diagnosis not present

## 2023-10-07 DIAGNOSIS — M5136 Other intervertebral disc degeneration, lumbar region with discogenic back pain only: Secondary | ICD-10-CM | POA: Diagnosis not present

## 2023-10-14 ENCOUNTER — Ambulatory Visit: Attending: Internal Medicine

## 2023-10-14 DIAGNOSIS — I5022 Chronic systolic (congestive) heart failure: Secondary | ICD-10-CM | POA: Diagnosis not present

## 2023-10-14 DIAGNOSIS — I088 Other rheumatic multiple valve diseases: Secondary | ICD-10-CM | POA: Diagnosis not present

## 2023-10-14 DIAGNOSIS — I5030 Unspecified diastolic (congestive) heart failure: Secondary | ICD-10-CM

## 2023-10-14 LAB — ECHOCARDIOGRAM COMPLETE
AR max vel: 1.16 cm2
AV Area VTI: 1.19 cm2
AV Area mean vel: 1.16 cm2
AV Mean grad: 16 mmHg
AV Peak grad: 29.4 mmHg
Ao pk vel: 2.71 m/s
Area-P 1/2: 2.66 cm2
S' Lateral: 5.48 cm

## 2023-10-14 MED ORDER — PERFLUTREN LIPID MICROSPHERE
1.0000 mL | INTRAVENOUS | Status: AC | PRN
  Administered 2023-10-14: 3 mL via INTRAVENOUS

## 2023-10-15 ENCOUNTER — Ambulatory Visit: Payer: Self-pay | Admitting: Internal Medicine

## 2023-10-15 DIAGNOSIS — I5022 Chronic systolic (congestive) heart failure: Secondary | ICD-10-CM

## 2023-10-20 ENCOUNTER — Telehealth: Payer: Self-pay | Admitting: Cardiology

## 2023-10-20 NOTE — Telephone Encounter (Signed)
 Called to confirm/remind patient of their appointment at the [facility] on 10/21/23.   Appointment:   [x] Confirmed  [] Left mess   [] No answer/No voice mail  [] VM Full/unable to leave message  [] Phone not in service  Patient reminded to bring all medications and/or complete list.  Confirmed patient has transportation. Gave directions, instructed to utilize valet parking.

## 2023-10-21 ENCOUNTER — Other Ambulatory Visit
Admission: RE | Admit: 2023-10-21 | Discharge: 2023-10-21 | Disposition: A | Discharge status: Home / Self Care | Source: Ambulatory Visit | Attending: Cardiology | Admitting: Cardiology

## 2023-10-21 ENCOUNTER — Encounter: Payer: Self-pay | Admitting: Internal Medicine

## 2023-10-21 ENCOUNTER — Ambulatory Visit (HOSPITAL_BASED_OUTPATIENT_CLINIC_OR_DEPARTMENT_OTHER): Admitting: Cardiology

## 2023-10-21 ENCOUNTER — Ambulatory Visit: Attending: Internal Medicine | Admitting: Internal Medicine

## 2023-10-21 VITALS — BP 87/56 | HR 62 | Wt 171.0 lb

## 2023-10-21 VITALS — BP 94/52 | HR 57 | Ht 67.0 in | Wt 171.4 lb

## 2023-10-21 DIAGNOSIS — I480 Paroxysmal atrial fibrillation: Secondary | ICD-10-CM

## 2023-10-21 DIAGNOSIS — I255 Ischemic cardiomyopathy: Secondary | ICD-10-CM | POA: Diagnosis not present

## 2023-10-21 DIAGNOSIS — E785 Hyperlipidemia, unspecified: Secondary | ICD-10-CM

## 2023-10-21 DIAGNOSIS — E1169 Type 2 diabetes mellitus with other specified complication: Secondary | ICD-10-CM

## 2023-10-21 DIAGNOSIS — I5022 Chronic systolic (congestive) heart failure: Secondary | ICD-10-CM | POA: Diagnosis not present

## 2023-10-21 DIAGNOSIS — I25118 Atherosclerotic heart disease of native coronary artery with other forms of angina pectoris: Secondary | ICD-10-CM

## 2023-10-21 LAB — BASIC METABOLIC PANEL WITH GFR
Anion gap: 10 (ref 5–15)
BUN: 22 mg/dL (ref 8–23)
CO2: 30 mmol/L (ref 22–32)
Calcium: 9.1 mg/dL (ref 8.9–10.3)
Chloride: 96 mmol/L — ABNORMAL LOW (ref 98–111)
Creatinine, Ser: 0.83 mg/dL (ref 0.61–1.24)
GFR, Estimated: >60 mL/min (ref >60)
Glucose, Bld: 98 mg/dL (ref 70–99)
Potassium: 4.3 mmol/L (ref 3.5–5.1)
Sodium: 136 mmol/L (ref 135–145)

## 2023-10-21 LAB — BRAIN NATRIURETIC PEPTIDE: B Natriuretic Peptide: 156.8 pg/mL — ABNORMAL HIGH (ref 0.0–100.0)

## 2023-10-21 MED ORDER — SPIRONOLACTONE 25 MG PO TABS: 12.5000 mg | ORAL_TABLET | Freq: Every day | ORAL | 3 refills | Status: DC

## 2023-10-21 NOTE — Progress Notes (Addendum)
 [facility] NOTE  Referring Physician: Mady Bruckner, MD  Primary Care: Jacob Toribio POUR, MD Primary Cardiologist: Dr. Bruckner End Heart Failure: Jacob Commander, DO  CC: HFrEF  HPI: Jacob Harrison is a 83 y.o. 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  Pertinent Family & social hx:   Cardiac History:  - CABG in 2001 with re-do CABG in 2012 - LVEF 65% until 2021 -  Drop in EF to 30-35% by 8/22 due to graft closure.  - ICD implant 12/22 by Dr. Cindie  Interval hx:  - Presents today to establish care. Reports doing fairly well from a symptomatic standpoint. He can perform all ADLs independently, work around the house and frequently gardens without significant difficulty. Previous dyspnea and orthopnea have resolved. Overall he is satisfied with his functional status but is concerned his slowly decreasing LVEF.   PHYSICAL EXAM: Vitals:   10/21/23 1200  BP: (!) 87/56  Pulse: 62  SpO2: 97%   Lungs- bilateral inspiratory crackles CARDIAC:  JVP: 8 cm          Normal rate with regular rhythm. no murmur.  Pulses 2+. 1-2+ bilateral pitting edema.  ABDOMEN: soft EXTREMITIES: Warm and well perfused.   DATA REVIEW  ECG: 10/21/23: NSR  As per my personal interpretation  ECHO: 10/14/23: LVEF 25%  CATH: 05/27/22:  Severe multivessel coronary artery disease, including chronic total occlusions of mid LAD and proximal RCA.  There is moderate LMCA and LCx disease.  Overall appearance is similar to prior catheterization in 2020. Known occlusions of all bypass grafts; occlusions of SVG-LAD and SVG-RCA redemonstrated today. Normal left heart and right heart filling pressures. Normal Fick cardiac output/index.    10/04/20:  -----SEVERE ~ 3 VESSEL NATIVE CAD------ Mid LAD to Dist LAD  lesion is 99% stenosed. Mid Cx lesion is 40% stenosed with 40% stenosed side branch in LPAV. Prox RCA to Dist RCA lesion is 100% stenosed. ---- GRAFTS----- SVG-RPDA graft was visualized by angiography. Prox Graft to Insertion lesion is 100% stenosed. SVG-LAD graft was visualized by angiography. Origin to Prox Graft lesion is 100% stenosed. (flush occluded) Unable to Visualize old Grafts (original SVG-rPDA & SVG-LCx). 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  ASSESSMENT & PLAN:  Heart Failure with reduced fraction - Etiology & History: ischemic cardiomyopathy - NYHA Class: NYHA IIB; limited by low back pain.  - Volume status: lasix  80mg  BID; chronic LE edema after harvesting veins; he has bilateral inspiratory crackles with a REDS < 30%, within normal limits. Will obtain CXR. - GDMT:  -  RAASi: losartan  12.5mg  daily; limited by SBP Beta-blocker: coreg  12.5mg  BID -  Start spironolactone  12.5mg  daily. Will follow up in pharmD clinic in 2 weeks to reassess labs and symptoms.  -  SGLT2i: jardiance  10mg  daily - ICD: followed by Dr. Cindie; device interrogation from 09/14/23 reviewed, less than 1% v pacing.  - Advanced therapies: not a candidate; stable functional status.    2. CAD - LHC in 2/24 with CTO of the mid LAD and proximal RCA; occlusion of all bypass grafts.  - Anti-anginal regimen: coreg , imdur , ranolazine .   3. Atrial fibrillation  - NSR today  - continue apixaban  5mg  BID  4. HLD - hx of rhabdo - continue evolocumab   I spent 65 minutes caring for this patient today including face to  face time, ordering and reviewing labs, reviewing records and echos noted above, seeing the patient, documenting in the record, and arranging follow ups.   Jacob Harrison Advanced [facility] Failure Mechanical Circulatory Support

## 2023-10-21 NOTE — Progress Notes (Signed)
 Cardiology Office Note:  .   Date:  10/21/2023  ID:  Jacob Harrison, DOB Sep 28, 1940, MRN [id number] PCP: Chandra Toribio POUR, MD  [facility] Providers Cardiologist:  Lonni Hanson, MD Electrophysiologist:  OLE ONEIDA HOLTS, MD     History of Present Illness: .   Jacob Harrison is a 83 y.o. male with history of coronary artery disease status post CABG and redo CABG, type 2 diabetes mellitus, chronic HFrEF due to ischemic cardiomyopathy status post ICD, paroxysmal atrial fibrillation, hyperlipidemia complicated by rhabdomyolysis with statin therapy, and chronic leg pain in the setting of spinal stenosis, who presents for follow-up HFrEF, CAD, and atrial fibrillation.  I last saw him in 07/2023, at which time he was feeling a bit better after increasing furosemide  to 80 mg twice daily.  He was not using his sublingual nitroglycerin  as often.  Repeat echocardiogram last week showed further decline in left ventricular systolic function to 20-25%.  Today, [name] reports that he has been doing fairly well from a heart standpoint.  He has had only minimal shortness of breath and chest discomfort with less nitroglycerin  use than at prior visits.  He denies palpitations and lightheadedness.  Chronic lower extremity edema (left greater than right) is stable and generally well-controlled with his furosemide  80 mg twice daily.  He is scheduled to see Dr. Gardenia in the advanced heart failure clinic later today.  [name]'s main complaint is of chronic low back pain with some numbness extending into his legs.  He is scheduled to see Dr. Eldonna next week to discuss back injections.  ROS: See HPI  Studies Reviewed: SABRA   EKG Interpretation Date/Time:  Wednesday October 21 2023 10:10:26 EDT Ventricular Rate:  57 PR Interval:  196 QRS Duration:  84 QT Interval:  440 QTC Calculation: 428 R Axis:   18  Text Interpretation: Sinus bradycardia Inferior infarct (cited on or before  27-Aug-2022) Anterior infarct (cited on or before 27-Aug-2022) Abnormal ECG When compared with ECG of 15-Jul-2023 11:04, PR interval has decreased Otherwise no significant change Confirmed by Andrina Locken [phone]) on 10/21/2023 11:10:15 AM    TTE (10/14/2023): Mildly dilated left ventricle with normal wall thickness.  LVEF 20-25% with global hypokinesis and grade 1 diastolic dysfunction.  Normal RV size and function.  Normal biatrial size.  No pericardial effusion.  Mildly calcified mitral valve with mild regurgitation.  Mild aortic stenosis.  Borderline dilation of the ascending aorta at 3.8 cm.  Normal CVP.  Risk Assessment/Calculations:    CHA2DS2-VASc Score = 6   This indicates a 9.7% annual risk of stroke. The patient's score is based upon: CHF History: 1 HTN History: 1 Diabetes History: 1 Stroke History: 0 Vascular Disease History: 1 Age Score: 2 Gender Score: 0                Physical Exam:   VS:  BP (!) 94/52   Pulse (!) 57   Ht 5' 7 (1.702 m)   Wt 171 lb 6.4 oz (77.7 kg)   SpO2 95%   BMI 26.85 kg/m    Wt Readings from Last 3 Encounters:  10/21/23 171 lb 6.4 oz (77.7 kg)  09/22/23 170 lb (77.1 kg)  08/19/23 167 lb (75.8 kg)    General:  NAD. Neck: No JVD or HJR. Lungs: Left basilar crackles with good air movement. Heart: Regular rate and rhythm with 2/6 systolic murmur.. Abdomen: Soft, nontender, nondistended. Extremities: 1+ pretibial edema (left greater than right).  ASSESSMENT  AND PLAN: .    Chronic HFrEF due to ischemic cardiomyopathy: [name] has mild lower extremity edema that is chronic and similar to baseline.  He endorses NYHA class II symptoms with his mobility primarily limited by his chronic back pain.  Given his soft blood pressure again noted today, I am reluctant to escalate his current GDMT consisting of carvedilol , empagliflozin , and losartan .  He is scheduled to meet with Dr. Gardenia in the advanced heart failure clinic later  today.  Coronary artery disease with stable angina: [name] has a long history of severe CAD status post 2 separate CABGs.  His last catheterization in 05/2022 demonstrated moderate LMCA and LCx disease with chronic total occlusions of the mid LAD and proximal RCA as well as occlusion of all bypass grafts.  Given that his angina has been fairly mild, I think it would be best to continue with medical therapy as revascularization options are limited and would be extremely high risk.  We will continue his current antianginal therapy consisting of carvedilol , isosorbide  mononitrate, and ranolazine .  Continue apixaban  and lieu of aspirin  as well as evolocumab .  Hyperlipidemia associated with type 2 diabetes mellitus and history of statin induced rhabdomyolysis: [name] is tolerating evolocumab  well.  Last lipid panel in 12/2022 showed LDL at goal.  Continue evolocumab  with repeat lipid panel scheduled for this fall through his PCP.  Paroxysmal atrial fibrillation: [name] is in sinus rhythm again today and denies palpitations.  Continue long-term anticoagulation with apixaban  in the setting of a CHA2DS2-VASc score of at least 6.  Continue current dose of carvedilol  as well, as mild PR prolongation noted on last EKG seems to have improved today.    Dispo: Return to clinic in 6 months.  Signed, Lonni Hanson, MD

## 2023-10-21 NOTE — Patient Instructions (Signed)

## 2023-10-21 NOTE — Patient Instructions (Addendum)
 Medication Changes:  START SPIRONOLACTONE  12.5 ONCE DAILY (1/2 TAB)   Lab Work:  Go over to the [facility]. Go pass the gift shop and have your blood work completed.  We will only call you if the results are abnormal or if the provider would like to make medication changes.   Follow-Up in: 2 weeks with our pharmacist Nason and repeat labs  At the [facility], you and your health needs are our priority. We have a designated team specialized in the treatment of Heart Failure. This Care Team includes your primary Heart Failure Specialized Cardiologist (physician), Advanced Practice Providers (APPs- Physician Assistants and Nurse Practitioners), and Pharmacist who all work together to provide you with the care you need, when you need it.   You may see any of the following providers on your designated Care Team at your next follow up:  Dr. Toribio Fuel Dr. Ezra Shuck Dr. [name] Dr. Odis Brownie Ellouise Class, FNP Jaun Bash, RPH-CPP  Please be sure to bring in all your medications bottles to every appointment.   Need to Contact Us :  If you have any questions or concerns before your next appointment please send us  a message through [facility] or call our office at [phone].    TO LEAVE A MESSAGE FOR THE NURSE SELECT OPTION 2, PLEASE LEAVE A MESSAGE INCLUDING: YOUR NAME DATE OF BIRTH CALL BACK NUMBER REASON FOR CALL**this is important as we prioritize the call backs  YOU WILL RECEIVE A CALL BACK THE SAME DAY AS LONG AS YOU CALL BEFORE 4:00 PM

## 2023-10-30 ENCOUNTER — Ambulatory Visit: Admitting: Physical Medicine and Rehabilitation

## 2023-10-30 ENCOUNTER — Encounter: Payer: Self-pay | Admitting: Physical Medicine and Rehabilitation

## 2023-10-30 DIAGNOSIS — M48062 Spinal stenosis, lumbar region with neurogenic claudication: Secondary | ICD-10-CM | POA: Diagnosis not present

## 2023-10-30 DIAGNOSIS — R269 Unspecified abnormalities of gait and mobility: Secondary | ICD-10-CM | POA: Diagnosis not present

## 2023-10-30 DIAGNOSIS — M5441 Lumbago with sciatica, right side: Secondary | ICD-10-CM | POA: Diagnosis not present

## 2023-10-30 DIAGNOSIS — G8929 Other chronic pain: Secondary | ICD-10-CM

## 2023-10-30 DIAGNOSIS — M5416 Radiculopathy, lumbar region: Secondary | ICD-10-CM

## 2023-10-30 NOTE — Progress Notes (Signed)
 Pain Scale   Average Pain 4 Patient advised he has lower back pain radiating to right leg Patient here for MRI review.        +Driver, -BT, -Dye Allergies.

## 2023-10-30 NOTE — Progress Notes (Signed)
 Jacob Harrison - 83 y.o. male MRN [id number]  Date of birth: May 29, 1940  Office Visit Note: Visit Date: 10/30/2023 PCP: Chandra Toribio POUR, MD Referred by: Chandra Toribio POUR, MD  Subjective: Chief Complaint  Patient presents with   Lower Back - Pain   HPI: Jacob Harrison is a 83 y.o. male who comes in today for evaluation of chronic, worsening and severe bilateral lower back pain radiating to buttocks and down right lateral leg to knee. Also reports numbness and weakness to bilateral legs with prolonged standing. He is prior patient of Dr. Oneil Herald. His pain becomes severe with prolonged standing and walking. He describes pain as sore and aching sensation, currently rates as 7 out of 10. Some relief of pain with home exercise regimen, rest and use of medications. History of formal physical therapy several years ago with minimal relief of pain. Recent lumbar MRI imaging shows lumbar spondylosis and degenerative disc disease, causing prominent impingement at L4-L5; moderate to prominent impingement at L2-L3; and moderate impingement at L3-L4 and L5-S1. 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 History of L3-L4 and L4-L5 lumbar decompression with Dr. Herald in 2020. He did undergo several epidural steroid injections with Dr. Eldonna prior to surgery, he reports significant relief of pain with these procedures. He is currently using cane to assist with ambulation. No recent trauma or falls.   His course is complicated by CHF, atrial fibrillation and diabetes mellitus. He is currently taking Eliquis .         Review of Systems  Musculoskeletal:  Positive for back pain.  Neurological:  Positive for tingling and weakness.  All other systems reviewed and are negative.  Otherwise per HPI.  Assessment & Plan: Visit Diagnoses:    ICD-10-CM   1. Chronic bilateral low back pain with right-sided sciatica  G89.29 Ambulatory referral to Physical  Medicine Rehab   M54.41     2. Radiculopathy, lumbar region  M54.16 Ambulatory referral to Physical Medicine Rehab    3. Spinal stenosis of lumbar region with neurogenic claudication  M48.062 Ambulatory referral to Physical Medicine Rehab    4. Gait abnormality  R26.9 Ambulatory referral to Physical Medicine Rehab       Plan: Findings:  Chronic, worsening and severe bilateral lower back pain radiating to buttocks and down right lateral leg to knee. Patient continues to have severe pain despite good conservative therapies such as formal physical therapy, home exercise regimen, rest and use of medications. Patients clinical presentation and exam are consistent with neurogenic claudication as a result of spinal canal stenosis. There is severe spinal canal stenosis at L4-L5, moderate to severe at L2-L3. We discussed treatment plan in detail today. Next step is to perform diagnostic and hopefully therapeutic bilateral L4 transforaminal epidural steroid injection under fluoroscopic guidance. He has no questions regarding injection procedure at this time. Should his pain persist post injection I also discussed consult with our spine surgeon Dr. Ozell Ada. I also believe he would be a good candidate for spinal cord stimulation given his history and continued pain. No red flag symptoms noted upon exam today.     Meds & Orders: No orders of the defined types were placed in this encounter.   Orders Placed This Encounter  Procedures   Ambulatory referral to Physical Medicine Rehab    Follow-up: Return for Bilateral L4 transforaminal epidural steroid injection.   Procedures: No procedures performed      Clinical History: CLINICAL DATA:  Low  back pain with chronic sciatica   EXAM: MRI LUMBAR SPINE WITHOUT CONTRAST   TECHNIQUE: Multiplanar, multisequence MR imaging of the lumbar spine was performed. No intravenous contrast was administered.   COMPARISON:  09/22/2017, and radiograph  08/26/2023   FINDINGS: Segmentation: The lowest lumbar type non-rib-bearing vertebra is labeled as L5.   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 3 mm degenerative retrolisthesis L2-3 L3-4.   Vertebrae: Disc desiccation throughout the lumbar spine with notable loss of intervertebral disc height at all levels between L2 and S1. Type 2 degenerative endplate findings at L2-3, L4-5, and L5-S1. Mild facet edema on the left at L4-5.   Conus medullaris and cauda equina: Conus extends to the L1-2 level. Conus and cauda equina appear normal.   Paraspinal and other soft tissues: Unremarkable   Disc levels:   L1-2: No significant impingement. Mild disc bulge and degenerative facet arthropathy.   L2-3: Moderate to severe central narrowing of the thecal sac with moderate displacement of the left L2 nerve in the lateral extraforaminal space and moderate bilateral subarticular lateral recess stenosis. Ligamentum flavum redundancy is also present.   L3-4: Moderate right foraminal stenosis and moderate bilateral subarticular lateral recess stenosis along with moderate displacement of the left L3 nerve in the lateral extraforaminal space due to disc bulge, right foraminal disc protrusion, intervertebral spurring, and facet arthropathy along with a suspected small (0.6 cm) left synovial cyst. Left facet joint effusion at this level. Postoperative findings along the posterior elements at this level.   L4-5: Prominent central narrowing of the thecal sac with moderate right foraminal stenosis and moderate bilateral subarticular lateral recess stenosis due to disc osteophyte complex, facet arthropathy, and right ligamentum flavum redundancy. Postoperative findings along the posterior elements at this level.   L5-S1: Moderate right subarticular lateral recess stenosis due to disc osteophyte complex.   IMPRESSION: 1. Lumbar spondylosis and degenerative disc  disease, causing prominent impingement at L4-5; moderate to prominent impingement at L2-3; and moderate impingement at L3-4 and L5-S1, as detailed above. 2. 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 3. Postoperative findings along the posterior elements at L3-4 and L4-5.     Electronically Signed   By: Ryan Salvage M.D.   On: 10/11/2023 16:24   He reports that he has never smoked. He has never been exposed to tobacco smoke. He has never used smokeless tobacco.  Recent Labs    11/19/22 1035 08/19/23 1149  HGBA1C 5.5 5.6    Objective:  VS:  HT:    WT:   BMI:     BP:   HR: bpm  TEMP: ( )  RESP:  Physical Exam Vitals and nursing note reviewed.  HENT:     Head: Normocephalic and atraumatic.     Right Ear: External ear normal.     Left Ear: External ear normal.     Nose: Nose normal.     Mouth/Throat:     Mouth: Mucous membranes are moist.  Eyes:     Extraocular Movements: Extraocular movements intact.  Cardiovascular:     Rate and Rhythm: Normal rate.     Pulses: Normal pulses.  Pulmonary:     Effort: Pulmonary effort is normal.  Abdominal:     General: Abdomen is flat. There is no distension.  Musculoskeletal:        General: Tenderness present.     Cervical back: Normal range of motion.     Comments: Patient is slow to rise from seated position  to standing. Good lumbar range of motion. No pain noted with facet loading. 5/5 strength noted with bilateral hip flexion, knee flexion/extension, ankle dorsiflexion/plantarflexion and EHL. No clonus noted bilaterally. No pain upon palpation of greater trochanters. No pain with internal/external rotation of bilateral hips. Sensation intact bilaterally. Negative slump test bilaterally. Ambulates with cane, gait slow and unsteady.   Skin:    General: Skin is warm and dry.     Capillary Refill: Capillary refill takes less than 2 seconds.  Neurological:     Mental Status: He is alert and  oriented to person, place, and time.     Gait: Gait abnormal.  Psychiatric:        Mood and Affect: Mood normal.        Behavior: Behavior normal.     Ortho Exam  Imaging: No results found.  Past Medical/Family/Surgical/Social History: Medications & Allergies reviewed per EMR, new medications updated. 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   Muscle weakness 11/19/2022   Statins contraindicated 10/16/2022   Paroxysmal atrial fibrillation (HCC) 08/27/2022   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) 08/27/2022   ICD (implantable cardioverter-defibrillator) in place 08/05/2022   Statin-induced rhabdomyolysis 07/05/2022   Transaminitis 07/05/2022   Chronic low back pain 07/02/2022   Dyspnea on exertion 05/22/2022   Chronic HFrEF (heart failure with reduced ejection fraction) (HCC) 12/13/2021   Acute on chronic heart failure with preserved ejection fraction (HFpEF) (HCC) 10/08/2020   Non-ST elevation (NSTEMI) myocardial infarction (HCC) 10/08/2020   CHF (congestive heart failure) (HCC) 10/04/2020   Hx of CABG    Accelerating angina (HCC) 10/03/2020   Rotator cuff disorder 08/09/2019   BPH associated with nocturia 11/29/2018   Status post lumbar spine surgery for decompression of spinal cord 10/29/2018   Spinal stenosis of lumbar region 08/02/2018   Atypical mole 07/30/2017   Sinus bradycardia 03/18/2017   Ventral hernia without obstruction or gangrene 02/25/2017   (HFpEF) heart failure with preserved ejection fraction (HCC) 09/28/2016   Ischemic cardiomyopathy 09/28/2016   Vitamin D  deficiency 06/27/2016   Vitamin B deficiency 06/27/2016   Type 2 diabetes mellitus without complication, without long-term current use of insulin  (HCC) 05/20/2016   Essential hypertension 05/20/2016   Hyperlipidemia associated with type 2 diabetes mellitus (HCC) 05/20/2016   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 10/2016 Echo: EF 65-70%, mild LVH, mildly dil LA. RV fxn nl.   Arthritis    BPH (benign prostatic hyperplasia)    Complication of anesthesia    dificult entubation,narrow   Coronary artery disease    a. 2001 CABG x 3 ([place] ): LIMA->LAD, VG->LCX, VG->RPDA; b. 04/05/2010 Cath ([place]): LM 61m, LAD 113m, LCX 39m, RCA 100p, LIMA->LAD atretic, VG->LCX 100, VG->RCA 80ost, 70d; c. 04/2010 Redo CABG x 2 ([place]): VG->LAD, VG->RPDA; d. 10/2012 Cath [place]): LM 20-30, LAD 100/95-31m, LCX 40-77m, RCA 100p, 80-100d, RPDA 99, RPL1 95, VG->LAD ok, VG->RPDA ok, EF 67%; e. 11/2016 MV: EF 48%, No ischemia.   Diabetes mellitus without complication (HCC)    Type II   Dysrhythmia    Heart murmur    Hyperlipidemia    Hypertension    Ischemic cardiomyopathy    a. 03/2021 s/p MDT Visia AF MRI VR SureScan single lead AICD (ser# [id number]).   Myocardial infarction (  HCC)    Scoliosis    Spinal stenosis    Family History  Problem Relation Age of Onset   Diabetes Mother    Hyperlipidemia Mother    Cancer Father        colon   Skin cancer Brother    Alcohol abuse Maternal Uncle    Diabetes Maternal Uncle    Hyperlipidemia Maternal Uncle    Cancer Paternal Uncle        colon   Past Surgical History:  Procedure Laterality Date   BACK SURGERY  2020   laminectomy L4 L5   CARDIAC CATHETERIZATION     CATARACT EXTRACTION, BILATERAL  10/2022   CORONARY ARTERY BYPASS GRAFT     x 2 (2001 and redo in late 2011 or early 2012)   CYSTOSCOPY WITH URETHRAL DILATATION N/A 01/13/2023   Procedure: CYSTOSCOPY WITH URETHRAL DILATATION;  Surgeon: Devere Lonni Righter, MD;  Location: [facility];  Service: Urology;  Laterality: N/A;  30 MINUTES   HERNIA REPAIR Left    inguinal   ICD IMPLANT N/A 03/20/2021   Procedure: ICD IMPLANT;  Surgeon: Cindie Ole DASEN, MD;   Location: [facility] INVASIVE CV LAB;  Service: Cardiovascular;  Laterality: N/A;   LEFT HEART CATH AND CORS/GRAFTS ANGIOGRAPHY N/A 10/04/2020   Procedure: LEFT HEART CATH AND CORS/GRAFTS ANGIOGRAPHY;  Surgeon: Anner Alm ORN, MD;  Location: [facility] INVASIVE CV LAB;  Service: Cardiovascular;  Laterality: N/A;   LUMBAR LAMINECTOMY/DECOMPRESSION MICRODISCECTOMY N/A 10/20/2018   Procedure: L3-4, L4-5 LUMBAR DECOMPRESSION with dural repair.;  Surgeon: Barbarann Oneil BROCKS, MD;  Location: [facility] OR;  Service: Orthopedics;  Laterality: N/A;   RIGHT/LEFT HEART CATH AND CORONARY/GRAFT ANGIOGRAPHY N/A 05/27/2022   Procedure: RIGHT/LEFT HEART CATH AND CORONARY/GRAFT ANGIOGRAPHY;  Surgeon: Mady Lonni, MD;  Location: [facility] INVASIVE CV LAB;  Service: Cardiovascular;  Laterality: N/A;   TONSILLECTOMY AND ADENOIDECTOMY     TRANSURETHRAL RESECTION OF PROSTATE  1990   Social History   Occupational History   Occupation: retired  Tobacco Use   Smoking status: Never    Passive exposure: Never   Smokeless tobacco: Never  Vaping Use   Vaping status: Never Used  Substance and Sexual Activity   Alcohol use: Not Currently    Comment: 1-2 drinks on occasion previously 5-7 drinks a week (was 10-15 drinks/week)   Drug use: Never   Sexual activity: Not Currently    Birth control/protection: None

## 2023-11-02 ENCOUNTER — Telehealth: Payer: Self-pay | Admitting: Pharmacist

## 2023-11-02 NOTE — Telephone Encounter (Signed)
 Called to confirm/remind patient of their appointment at the [facility] on 11/03/23.   Appointment:   [x] Confirmed  [] Left mess   [] No answer/No voice mail  [] VM Full/unable to leave message  [] Phone not in service  Patient reminded to bring all medications and/or complete list.  Confirmed patient has transportation. Gave directions, instructed to utilize valet parking.

## 2023-11-02 NOTE — Progress Notes (Signed)
 Advanced Heart Failure Clinic Note  PCP: Chandra Toribio POUR, MD PCP-Cardiologist: Lonni Hanson, MD HF-Cardiologist: [name], DO  HPI:  Jacob Harrison is a 83 y.o. male with CAD s/p CABG w/ re-do CABG, T2DM, chronic HFrEF 2/2 ischemic cardiomyopathy s/p ICD, pAfib, hyperlipidemia with hx of rhabdo (statin), spinal stenosis, and atrial fibrillation presenting who established care with AHF clinic 10/21/23.    Cardiac History:  - CABG in 2001 with re-do CABG in 2012 - LVEF 65% until 2021 -  Drop in EF to 30-35% by 11/2020 due to graft closure.  - ICD implant 03/2021 by Dr. Cindie  Seen by Dr Commander 10/21/23. Spironolactone  12.5 mg daily added.  Today Jacob Harrison returns to Heart Failure Clinic for pharmacist medication titration. Reports feeling ok. Reports DOE is stable, episodes of chest pain less frequent, LEE improved in the last several weeks. Reports lightheadedness ocne every ~2 weeks. Primary complain at this time is back and knee pain. Denies fatigue palpitations orthopnea PND.  Reports being able to complete all activities of daily living (ADLs). Is active throughout the day, walks or stationary bikes almost daily. Weight at home is stable to lower since starting spironolactone . Takes furosemide  80 mg BID. Appetite is good. Does follow a low sodium diet. BP at home 100s/60s.  Current Heart Failure Medications: Loop diuretic: furosemide  80 mg BID Beta-Blocker: carvedilol  12.5 mg BID ACEI/ARB/ARNI: losartan  12.5 mg daily MRA: spironolactone  12.5 mg daily SGLT2i: Jardiance  10 mg daily Other: isosorbide  ER 30 mg BID  Has the patient been experiencing any side effects to the medications prescribed? No  Does the patient have any problems obtaining medications due to transportation or finances? No  Understanding of regimen: Excellent  Understanding of indications: Excellent  Potential of adherence: Excellent  Patient understands to avoid  NSAIDs.  Patient understands to avoid decongestants.  Pertinent Lab Values: Creatinine, Ser  Date Value Ref Range Status  10/21/2023 0.83 0.61 - 1.24 mg/dL Final   BUN  Date Value Ref Range Status  10/21/2023 22 8 - 23 mg/dL Final  98/93/7974 22 8 - 27 mg/dL Final   Potassium  Date Value Ref Range Status  10/21/2023 4.3 3.5 - 5.1 mmol/L Final   Sodium  Date Value Ref Range Status  10/21/2023 136 135 - 145 mmol/L Final  04/13/2023 138 134 - 144 mmol/L Final   B Natriuretic Peptide  Date Value Ref Range Status  10/21/2023 156.8 (H) 0.0 - 100.0 pg/mL Final    Comment:    Performed at [facility], [address] [place], [place] [address]   Magnesium  Date Value Ref Range Status  02/25/2019 1.9 1.6 - 2.3 mg/dL Final   TSH  Date Value Ref Range Status  07/05/2022 2.307 0.350 - 4.500 uIU/mL Final    Comment:    Performed by a 3rd Generation assay with a functional sensitivity of <=0.01 uIU/mL. Performed at [facility], [address] [place], [place] [address]   08/31/2020 2.180 0.450 - 4.500 uIU/mL Final    Vital Signs: Today's Vitals   11/03/23 1115  BP: (!) 92/52  Pulse: 83  SpO2: 95%  Weight: 169 lb 12.8 oz (77 kg)    Assessment/Plan: Heart Failure with reduced fraction - Etiology & History: ischemic cardiomyopathy - NYHA Class: NYHA IIB; limited by low back pain.  - Volume status: lasix  80mg  BID; chronic LE edema after harvesting veins. Weight is down 1 lb today and LEE improved with spironolactone . - GDMT:  >  RAASi: losartan  12.5mg  daily; limited by SBP. > Beta-blocker: carvedilol  6.25 mg BID > MRA: spironolactone  12.5 mg daily. Tolerating well with improvement in LEE. Check BMET and BNP today. > SGLT2i: Jardiance  10mg  daily - ICD: followed by Dr. Cindie; device interrogation from 09/14/23 reviewed, less than 1% v pacing.  - Advanced therapies: not a candidate; stable functional status.     2. CAD - LHC in 05/2022 with CTO of the  mid LAD and proximal RCA; occlusion of all bypass grafts.  - Anti-anginal regimen: Coreg , Imdur , Ranexa .    3. Atrial fibrillation  - NSR today  - continue apixaban  5mg  BID   4. HLD - hx of rhabdo - continue Repatha  - Repeat lipid panel ordered 09/22/23, pending results.  Follow up: 2 months with PharmD, 6 months with MD  Please do not hesitate to reach out with questions or concerns,  Jaun Bash, PharmD, CPP, BCPS, [facility] Heart Failure Pharmacist  Phone - [phone] 11/03/2023 12:07 PM

## 2023-11-03 ENCOUNTER — Ambulatory Visit (HOSPITAL_BASED_OUTPATIENT_CLINIC_OR_DEPARTMENT_OTHER): Admitting: Pharmacist

## 2023-11-03 ENCOUNTER — Other Ambulatory Visit
Admission: RE | Admit: 2023-11-03 | Discharge: 2023-11-03 | Disposition: A | Discharge status: Home / Self Care | Source: Ambulatory Visit | Attending: Internal Medicine | Admitting: Internal Medicine

## 2023-11-03 VITALS — BP 92/52 | HR 83 | Wt 169.8 lb

## 2023-11-03 DIAGNOSIS — I5022 Chronic systolic (congestive) heart failure: Secondary | ICD-10-CM | POA: Diagnosis not present

## 2023-11-03 LAB — BASIC METABOLIC PANEL WITH GFR
Anion gap: 14 (ref 5–15)
BUN: 33 mg/dL — ABNORMAL HIGH (ref 8–23)
CO2: 28 mmol/L (ref 22–32)
Calcium: 10 mg/dL (ref 8.9–10.3)
Chloride: 89 mmol/L — ABNORMAL LOW (ref 98–111)
Creatinine, Ser: 1.04 mg/dL (ref 0.61–1.24)
GFR, Estimated: >60 mL/min (ref >60)
Glucose, Bld: 101 mg/dL — ABNORMAL HIGH (ref 70–99)
Potassium: 4.8 mmol/L (ref 3.5–5.1)
Sodium: 131 mmol/L — ABNORMAL LOW (ref 135–145)

## 2023-11-03 LAB — BRAIN NATRIURETIC PEPTIDE: B Natriuretic Peptide: 112.1 pg/mL — ABNORMAL HIGH (ref 0.0–100.0)

## 2023-11-03 NOTE — Patient Instructions (Addendum)
 It was a pleasure seeing you today!  MEDICATIONS: -No medication changes today. -Call if you have questions about your medications.  LABS: -We will call you if your labs need attention.  NEXT APPOINTMENT: Return to clinic in 2 months to see Jacob Harrison.  In general, to take care of your heart failure: -Limit your fluid intake to 2 Liters (half-gallon) per day.   -Limit your salt intake to ideally 2-3 grams (2000-3000 mg) per day. -Weigh yourself daily and record, and bring that weight diary to your next appointment.  (Weight gain of 2-3 pounds in 1 day typically means fluid weight.) -The medications for your heart are to help your heart and help you live longer.   -Please contact us  before stopping any of your heart medications.  Call the clinic at [phone] to reschedule future appointments or [phone] with questions.

## 2023-11-04 ENCOUNTER — Ambulatory Visit: Payer: Self-pay | Admitting: Pharmacist

## 2023-11-16 NOTE — Progress Notes (Signed)
 Remote ICD transmission.

## 2023-11-26 ENCOUNTER — Ambulatory Visit: Admitting: Physical Medicine and Rehabilitation

## 2023-11-26 ENCOUNTER — Other Ambulatory Visit: Payer: Self-pay

## 2023-11-26 DIAGNOSIS — M5416 Radiculopathy, lumbar region: Secondary | ICD-10-CM | POA: Diagnosis not present

## 2023-11-26 MED ORDER — METHYLPREDNISOLONE ACETATE 40 MG/ML IJ SUSP
40.0000 mg | Freq: Once | INTRAMUSCULAR | Status: AC
  Administered 2023-11-26: 40 mg

## 2023-11-26 NOTE — Patient Instructions (Signed)

## 2023-11-26 NOTE — Progress Notes (Signed)
 Pain Scale   Average Pain 6   100/58 71    +Driver, +BT, -Dye Allergies.  Eliquis 

## 2023-11-29 NOTE — Progress Notes (Signed)
 Jacob Harrison - 83 y.o. male MRN [id number]  Date of birth: 1941-01-14  Office Visit Note: Visit Date: 11/26/2023 PCP: Chandra Toribio POUR, MD Referred by: Chandra Toribio POUR, MD  Subjective: No chief complaint on file.  HPI:  Jacob Harrison is a 83 y.o. male who comes in today at the request of Duwaine Pouch, FNP for planned Bilateral L4-5 Lumbar Transforaminal epidural steroid injection with fluoroscopic guidance.  The patient has failed conservative care including home exercise, medications, time and activity modification.  This injection will be diagnostic and hopefully therapeutic.  Please see requesting physician notes for further details and justification.   ROS Otherwise per HPI.  Assessment & Plan: Visit Diagnoses:    ICD-10-CM   1. Radiculopathy, lumbar region  M54.16 XR C-ARM NO REPORT    Epidural Steroid injection    methylPREDNISolone  acetate (DEPO-MEDROL ) injection 40 mg      Plan: No additional findings.   Meds & Orders:  Meds ordered this encounter  Medications   methylPREDNISolone  acetate (DEPO-MEDROL ) injection 40 mg    Orders Placed This Encounter  Procedures   XR C-ARM NO REPORT   Epidural Steroid injection    Follow-up: Return for visit to requesting provider as needed.   Procedures: No procedures performed  Lumbosacral Transforaminal Epidural Steroid Injection - Sub-Pedicular Approach with Fluoroscopic Guidance  Patient: Jacob Harrison      Date of Birth: 01-04-1941 MRN: [id number] PCP: Chandra Toribio POUR, MD      Visit Date: 11/26/2023   Universal Protocol:    Date/Time: 11/26/2023  Consent Given By: the patient  Position: PRONE  Additional Comments: Vital signs were monitored before and after the procedure. Patient was prepped and draped in the usual sterile fashion. The correct patient, procedure, and site was verified.   Injection Procedure Details:   Procedure diagnoses: Radiculopathy, lumbar region [M54.16]     Meds Administered:  Meds ordered this encounter  Medications   methylPREDNISolone  acetate (DEPO-MEDROL ) injection 40 mg    Laterality: Bilateral  Location/Site: L4  Needle:5.0 in., 22 ga.  Short bevel or Quincke spinal needle  Needle Placement: Transforaminal  Findings:    -Comments: Excellent flow of contrast along the nerve, nerve root and into the epidural space.  Procedure Details: After squaring off the end-plates to get a true AP view, the C-arm was positioned so that an oblique view of the foramen as noted above was visualized. The target area is just inferior to the nose of the scotty dog or sub pedicular. The soft tissues overlying this structure were infiltrated with 2-3 ml. of 1% Lidocaine  without Epinephrine.  The spinal needle was inserted toward the target using a trajectory view along the fluoroscope beam.  Under AP and lateral visualization, the needle was advanced so it did not puncture dura and was located close the 6 O'Clock position of the pedical in AP tracterory. Biplanar projections were used to confirm position. Aspiration was confirmed to be negative for CSF and/or blood. A 1-2 ml. volume of Isovue-250 was injected and flow of contrast was noted at each level. Radiographs were obtained for documentation purposes.   After attaining the desired flow of contrast documented above, a 0.5 to 1.0 ml test dose of 0.25% Marcaine  was injected into each respective transforaminal space.  The patient was observed for 90 seconds post injection.  After no sensory deficits were reported, and normal lower extremity motor function was noted,   the above injectate was administered so that equal amounts  of the injectate were placed at each foramen (level) into the transforaminal epidural space.   Additional Comments:  The patient tolerated the procedure well Dressing: 2 x 2 sterile gauze and Band-Aid    Post-procedure details: Patient was observed during the  procedure. Post-procedure instructions were reviewed.  Patient left the clinic in stable condition.    Clinical History: CLINICAL DATA:  Low back pain with chronic sciatica   EXAM: MRI LUMBAR SPINE WITHOUT CONTRAST   TECHNIQUE: Multiplanar, multisequence MR imaging of the lumbar spine was performed. No intravenous contrast was administered.   COMPARISON:  09/22/2017, and radiograph 08/26/2023   FINDINGS: Segmentation: The lowest lumbar type non-rib-bearing vertebra is labeled as L5.   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 3 mm degenerative retrolisthesis L2-3 L3-4.   Vertebrae: Disc desiccation throughout the lumbar spine with notable loss of intervertebral disc height at all levels between L2 and S1. Type 2 degenerative endplate findings at L2-3, L4-5, and L5-S1. Mild facet edema on the left at L4-5.   Conus medullaris and cauda equina: Conus extends to the L1-2 level. Conus and cauda equina appear normal.   Paraspinal and other soft tissues: Unremarkable   Disc levels:   L1-2: No significant impingement. Mild disc bulge and degenerative facet arthropathy.   L2-3: Moderate to severe central narrowing of the thecal sac with moderate displacement of the left L2 nerve in the lateral extraforaminal space and moderate bilateral subarticular lateral recess stenosis. Ligamentum flavum redundancy is also present.   L3-4: Moderate right foraminal stenosis and moderate bilateral subarticular lateral recess stenosis along with moderate displacement of the left L3 nerve in the lateral extraforaminal space due to disc bulge, right foraminal disc protrusion, intervertebral spurring, and facet arthropathy along with a suspected small (0.6 cm) left synovial cyst. Left facet joint effusion at this level. Postoperative findings along the posterior elements at this level.   L4-5: Prominent central narrowing of the thecal sac with  moderate right foraminal stenosis and moderate bilateral subarticular lateral recess stenosis due to disc osteophyte complex, facet arthropathy, and right ligamentum flavum redundancy. Postoperative findings along the posterior elements at this level.   L5-S1: Moderate right subarticular lateral recess stenosis due to disc osteophyte complex.   IMPRESSION: 1. Lumbar spondylosis and degenerative disc disease, causing prominent impingement at L4-5; moderate to prominent impingement at L2-3; and moderate impingement at L3-4 and L5-S1, as detailed above. 2. 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 3. Postoperative findings along the posterior elements at L3-4 and L4-5.     Electronically Signed   By: Ryan Salvage M.D.   On: 10/11/2023 16:24     Objective:  VS:  HT:    WT:   BMI:     BP:   HR: bpm  TEMP: ( )  RESP:  Physical Exam Vitals and nursing note reviewed.  Constitutional:      General: He is not in acute distress.    Appearance: Normal appearance. He is not ill-appearing.  HENT:     Head: Normocephalic and atraumatic.     Right Ear: External ear normal.     Left Ear: External ear normal.     Nose: No congestion.  Eyes:     Extraocular Movements: Extraocular movements intact.  Cardiovascular:     Rate and Rhythm: Normal rate.     Pulses: Normal pulses.  Pulmonary:     Effort: Pulmonary effort is normal. No respiratory distress.  Abdominal:     General:  There is no distension.     Palpations: Abdomen is soft.  Musculoskeletal:        General: No tenderness or signs of injury.     Cervical back: Neck supple.     Right lower leg: No edema.     Left lower leg: No edema.     Comments: Patient has good distal strength without clonus.  Skin:    Findings: No erythema or rash.  Neurological:     General: No focal deficit present.     Mental Status: He is alert and oriented to person, place, and time.     Sensory: No sensory  deficit.     Motor: No weakness or abnormal muscle tone.     Coordination: Coordination normal.  Psychiatric:        Mood and Affect: Mood normal.        Behavior: Behavior normal.      Imaging: No results found.

## 2023-11-29 NOTE — Procedures (Signed)
 Lumbosacral Transforaminal Epidural Steroid Injection - Sub-Pedicular Approach with Fluoroscopic Guidance  Patient: Jacob Harrison      Date of Birth: October 29, 1940 MRN: [id number] PCP: Chandra Toribio POUR, MD      Visit Date: 11/26/2023   Universal Protocol:    Date/Time: 11/26/2023  Consent Given By: the patient  Position: PRONE  Additional Comments: Vital signs were monitored before and after the procedure. Patient was prepped and draped in the usual sterile fashion. The correct patient, procedure, and site was verified.   Injection Procedure Details:   Procedure diagnoses: Radiculopathy, lumbar region [M54.16]    Meds Administered:  Meds ordered this encounter  Medications   methylPREDNISolone  acetate (DEPO-MEDROL ) injection 40 mg    Laterality: Bilateral  Location/Site: L4  Needle:5.0 in., 22 ga.  Short bevel or Quincke spinal needle  Needle Placement: Transforaminal  Findings:    -Comments: Excellent flow of contrast along the nerve, nerve root and into the epidural space.  Procedure Details: After squaring off the end-plates to get a true AP view, the C-arm was positioned so that an oblique view of the foramen as noted above was visualized. The target area is just inferior to the nose of the scotty dog or sub pedicular. The soft tissues overlying this structure were infiltrated with 2-3 ml. of 1% Lidocaine  without Epinephrine.  The spinal needle was inserted toward the target using a trajectory view along the fluoroscope beam.  Under AP and lateral visualization, the needle was advanced so it did not puncture dura and was located close the 6 O'Clock position of the pedical in AP tracterory. Biplanar projections were used to confirm position. Aspiration was confirmed to be negative for CSF and/or blood. A 1-2 ml. volume of Isovue-250 was injected and flow of contrast was noted at each level. Radiographs were obtained for documentation purposes.   After  attaining the desired flow of contrast documented above, a 0.5 to 1.0 ml test dose of 0.25% Marcaine  was injected into each respective transforaminal space.  The patient was observed for 90 seconds post injection.  After no sensory deficits were reported, and normal lower extremity motor function was noted,   the above injectate was administered so that equal amounts of the injectate were placed at each foramen (level) into the transforaminal epidural space.   Additional Comments:  The patient tolerated the procedure well Dressing: 2 x 2 sterile gauze and Band-Aid    Post-procedure details: Patient was observed during the procedure. Post-procedure instructions were reviewed.  Patient left the clinic in stable condition.

## 2023-12-13 ENCOUNTER — Encounter: Payer: Self-pay | Admitting: Emergency Medicine

## 2023-12-13 ENCOUNTER — Ambulatory Visit (INDEPENDENT_AMBULATORY_CARE_PROVIDER_SITE_OTHER)

## 2023-12-13 ENCOUNTER — Ambulatory Visit
Admission: EM | Admit: 2023-12-13 | Discharge: 2023-12-13 | Disposition: A | Discharge status: Home / Self Care | Attending: Emergency Medicine | Admitting: Emergency Medicine

## 2023-12-13 DIAGNOSIS — S8992XA Unspecified injury of left lower leg, initial encounter: Secondary | ICD-10-CM

## 2023-12-13 DIAGNOSIS — M79605 Pain in left leg: Secondary | ICD-10-CM | POA: Diagnosis not present

## 2023-12-13 NOTE — ED Provider Notes (Signed)
 [facility]    CSN: [id number] Arrival date & time: 12/13/23  9070      History   Chief Complaint Chief Complaint  Patient presents with   Leg Injury    L leg    Fall    HPI Jacob Harrison is a 83 y.o. male.  1 week ago he was getting in the shower, tripped on the track of the sliding door.  He fell forward and caught himself on the wall with his hands, but his left leg hit the shower door. He did not hit his head or lose consciousness. Has been having pain in the left leg, mostly if he is standing for a while. Rates about 5/10 with activity. Using Tylenol , has iced.   He does take Eliquis . He is also on several heart medications Carvedilol  6.25 mg twice daily, Lasix  80 mg twice daily, Aldactone  12.5 mg daily, losartan  12.5 mg daily. His blood pressures have been very soft over the last several months At his cardiology appointment 1 month ago BP was 87/56.  However his cardiologist has not adjusted his medications - they have discussed it but since he is asymptomatic it seems cardiology is not concerned at this time.    He denies any headaches, dizziness, fatigue, shortness of breath.  No falls apart from this episode where he tripped.   Past Medical History:  Diagnosis Date   (HFpEF) heart failure with preserved ejection fraction (HCC)    a. 10/2016 Echo: EF 65-70%, mild LVH, mildly dil LA. RV fxn nl.   Arthritis    BPH (benign prostatic hyperplasia)    Complication of anesthesia    dificult entubation,narrow   Coronary artery disease    a. 2001 CABG x 3 ([place] ): LIMA->LAD, VG->LCX, VG->RPDA; b. 04/05/2010 Cath ([place]): LM 33m, LAD 13m, LCX 44m, RCA 100p, LIMA->LAD atretic, VG->LCX 100, VG->RCA 80ost, 70d; c. 04/2010 Redo CABG x 2 ([place]): VG->LAD, VG->RPDA; d. 10/2012 Cath [place]): LM 20-30, LAD 100/95-53m, LCX 40-37m, RCA 100p, 80-100d, RPDA 99, RPL1 95, VG->LAD ok, VG->RPDA ok, EF 67%; e. 11/2016 MV: EF 48%, No ischemia.   Diabetes mellitus  without complication (HCC)    Type II   Dysrhythmia    Heart murmur    Hyperlipidemia    Hypertension    Ischemic cardiomyopathy    a. 03/2021 s/p MDT Visia AF MRI VR SureScan single lead AICD (ser# [id number]).   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   Muscle weakness 11/19/2022   Statins contraindicated 10/16/2022   Paroxysmal atrial fibrillation (HCC) 08/27/2022   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) 08/27/2022   ICD (implantable cardioverter-defibrillator) in place 08/05/2022   Statin-induced rhabdomyolysis 07/05/2022   Transaminitis 07/05/2022   Chronic low back pain 07/02/2022   Dyspnea on exertion 05/22/2022   Chronic HFrEF (heart failure with reduced ejection fraction) (HCC) 12/13/2021   Acute on chronic heart failure with preserved ejection fraction (HFpEF) (HCC) 10/08/2020   Non-ST elevation (NSTEMI) myocardial infarction (HCC) 10/08/2020   CHF (congestive heart failure) (HCC) 10/04/2020   Hx of CABG    Accelerating angina (HCC) 10/03/2020   Rotator cuff disorder 08/09/2019   BPH associated with nocturia 11/29/2018   Status post lumbar spine surgery for decompression of spinal cord 10/29/2018   Spinal stenosis of lumbar region 08/02/2018   Atypical mole 07/30/2017  Sinus bradycardia 03/18/2017   Ventral hernia without obstruction or gangrene 02/25/2017   (HFpEF) heart failure with preserved ejection fraction (HCC) 09/28/2016   Ischemic cardiomyopathy 09/28/2016   Vitamin D  deficiency 06/27/2016   Vitamin B deficiency 06/27/2016   Type 2 diabetes mellitus without complication, without long-term current use of insulin  (HCC) 05/20/2016   Essential hypertension 05/20/2016   Hyperlipidemia associated with type 2 diabetes mellitus (HCC) 05/20/2016   Coronary artery disease of native heart with stable angina pectoris  (HCC) 05/20/2016    Past Surgical History:  Procedure Laterality Date   BACK SURGERY  2020   laminectomy L4 L5   CARDIAC CATHETERIZATION     CATARACT EXTRACTION, BILATERAL  10/2022   CORONARY ARTERY BYPASS GRAFT     x 2 (2001 and redo in late 2011 or early 2012)   CYSTOSCOPY WITH URETHRAL DILATATION N/A 01/13/2023   Procedure: CYSTOSCOPY WITH URETHRAL DILATATION;  Surgeon: Devere Lonni Righter, MD;  Location: [facility];  Service: Urology;  Laterality: N/A;  30 MINUTES   HERNIA REPAIR Left    inguinal   ICD IMPLANT N/A 03/20/2021   Procedure: ICD IMPLANT;  Surgeon: Cindie Ole DASEN, MD;  Location: [facility];  Service: Cardiovascular;  Laterality: N/A;   LEFT HEART CATH AND CORS/GRAFTS ANGIOGRAPHY N/A 10/04/2020   Procedure: LEFT HEART CATH AND CORS/GRAFTS ANGIOGRAPHY;  Surgeon: Anner Alm ORN, MD;  Location: [facility];  Service: Cardiovascular;  Laterality: N/A;   LUMBAR LAMINECTOMY/DECOMPRESSION MICRODISCECTOMY N/A 10/20/2018   Procedure: L3-4, L4-5 LUMBAR DECOMPRESSION with dural repair.;  Surgeon: Barbarann Oneil BROCKS, MD;  Location: [facility];  Service: Orthopedics;  Laterality: N/A;   RIGHT/LEFT HEART CATH AND CORONARY/GRAFT ANGIOGRAPHY N/A 05/27/2022   Procedure: RIGHT/LEFT HEART CATH AND CORONARY/GRAFT ANGIOGRAPHY;  Surgeon: Mady Lonni, MD;  Location: [facility];  Service: Cardiovascular;  Laterality: N/A;   TONSILLECTOMY AND ADENOIDECTOMY     TRANSURETHRAL RESECTION OF PROSTATE  1990     Home Medications    Prior to Admission medications   Medication Sig Start Date End Date Taking? Authorizing Provider  acetaminophen  (TYLENOL ) 500 MG tablet Take 1,000 mg by mouth as needed for moderate pain or headache.    [provider]  carvedilol  (COREG ) 12.5 MG tablet Take 0.5 tablets (6.25 mg total) by mouth 2 (two) times daily with a meal. 05/27/22   End, Lonni, MD  Cholecalciferol (VITAMIN D -3 PO) Take 800 Units by mouth in the morning.     [provider]  cyanocobalamin  500 MCG tablet Take 500 mcg by mouth in the morning.    [provider]  ELIQUIS  5 MG TABS tablet TAKE 1 TABLET BY MOUTH TWICE A DAY 06/24/23   End, Lonni, MD  empagliflozin  (JARDIANCE ) 10 MG TABS tablet TAKE 1 TABLET BY MOUTH EVERY DAY BEFORE BREAKFAST 07/01/23   End, Lonni, MD  escitalopram  (LEXAPRO ) 10 MG tablet TAKE 1 TABLET BY MOUTH EVERY DAY 09/14/23   Chandra Toribio POUR, MD  Evolocumab  (REPATHA  SURECLICK) 140 MG/ML SOAJ Inject 140 mg into the skin every 14 (fourteen) days. 10/29/22   End, Lonni, MD  finasteride  (PROSCAR ) 5 MG tablet TAKE 1 TABLET (5 MG TOTAL) BY MOUTH DAILY. 08/27/23   Chandra Toribio POUR, MD  furosemide  (LASIX ) 80 MG tablet Take 1 tablet (80 mg total) by mouth 2 (two) times daily. 04/15/23   End, Lonni, MD  Glucosamine-Chondroit-Vit C-Mn (GLUCOSAMINE 1500 COMPLEX) CAPS Take 1 capsule by mouth in the morning.    [provider]  isosorbide  mononitrate (IMDUR ) 30 MG 24 hr tablet TAKE 1 TABLET BY MOUTH 2 TIMES DAILY. 09/22/23   End, Lonni, MD  losartan  (COZAAR ) 25 MG tablet Take 12.5 mg by mouth daily in the afternoon.    [provider]  MAGNESIUM GLYCINATE, MAGNESIUM,  09/13/23   [provider]  Multiple Vitamins-Minerals (PRESERVISION AREDS 2 PO) Take 1 capsule by mouth 2 (two) times a day.    [provider]  nitroGLYCERIN  (NITROSTAT ) 0.4 MG SL tablet Place 1 tablet (0.4 mg total) under the tongue every 5 (five) minutes as needed for chest pain. 04/20/23   End, Lonni, MD  Polyethyl Glycol-Propyl Glycol (LUBRICANT EYE DROPS) 0.4-0.3 % SOLN Place 1-2 drops into both eyes 3 (three) times daily as needed (dry/irritated eyes.).    [provider]  ranolazine  (RANEXA ) 1000 MG SR tablet TAKE 1 TABLET BY MOUTH TWICE A DAY 07/29/23   End, Lonni, MD  spironolactone  (ALDACTONE ) 25 MG tablet Take 0.5 tablets (12.5 mg total) by mouth daily. 10/21/23 01/19/24  Sabharwal,  Aditya, DO  tamsulosin (FLOMAX) 0.4 MG CAPS capsule Take 0.4 mg by mouth in the morning.    [provider]  triamcinolone  cream (KENALOG ) 0.1 % Apply 1 Application topically as directed. Qd up to 5 days per week to itchy rash on body until clear, avoid face, groin, axilla 06/19/22   Hester Alm BROCKS, MD    Family History Family History  Problem Relation Age of Onset   Diabetes Mother    Hyperlipidemia Mother    Cancer Father        colon   Skin cancer Brother    Alcohol abuse Maternal Uncle    Diabetes Maternal Uncle    Hyperlipidemia Maternal Uncle    Cancer Paternal Uncle        colon    Social History Social History   Tobacco Use   Smoking status: Never    Passive exposure: Never   Smokeless tobacco: Never  Vaping Use   Vaping status: Never Used  Substance Use Topics   Alcohol use: Not Currently    Comment: 1-2 drinks on occasion previously 5-7 drinks a week (was 10-15 drinks/week)   Drug use: Never     Allergies   Statins, Other, and Tetracyclines & related   Review of Systems Review of Systems  As per HPI  Physical Exam Triage Vital Signs ED Triage Vitals  Encounter Vitals Group     BP 12/13/23 0945 (!) 88/52     Girls Systolic BP Percentile --      Girls Diastolic BP Percentile --      Boys Systolic BP Percentile --      Boys Diastolic BP Percentile --      Pulse Rate 12/13/23 0945 75     Resp 12/13/23 0945 18     Temp 12/13/23 0945 98.8 F (37.1 C)     Temp Source 12/13/23 0945 Oral     SpO2 12/13/23 0945 97 %     Weight 12/13/23 0944 169 lb 12.1 oz (77 kg)     Height --      Head Circumference --      Peak Flow --      Pain Score 12/13/23 0943 3     Pain Loc --      Pain Education --      Exclude from Growth Chart --    No data found.  Updated Vital Signs BP (!) 87/54 (BP Location: Left  Arm)   Pulse 75   Temp 98.8 F (37.1 C) (Oral)   Resp 18   Wt 169 lb 12.1 oz (77 kg)   SpO2 97%   BMI 26.59 kg/m    Physical  Exam Vitals and nursing note reviewed.  Constitutional:      General: He is not in acute distress.    Appearance: He is not ill-appearing.  HENT:     Mouth/Throat:     Pharynx: Oropharynx is clear.  Cardiovascular:     Rate and Rhythm: Normal rate and regular rhythm.     Pulses: Normal pulses.     Heart sounds: Normal heart sounds.  Pulmonary:     Effort: Pulmonary effort is normal.     Breath sounds: Normal breath sounds.  Musculoskeletal:     Left lower leg: Tenderness present. No lacerations.       Legs:     Comments: Left lateral leg with area of faint erythema, some light bruising centrally. No hematoma. Tender to palpation. Good ROM of knee and ankle. Distal sensation intact. Faint DP and PT pulses -- states this is his baseline post graft. Cap refill of all toes is < 1 second.    Skin:    General: Skin is warm and dry.     Capillary Refill: Capillary refill takes less than 2 seconds.  Neurological:     Mental Status: He is alert and oriented to person, place, and time.     Cranial Nerves: Cranial nerves 2-12 are intact. No cranial nerve deficit.     Sensory: No sensory deficit.     Motor: No weakness.     Coordination: Coordination normal.     Gait: Gait normal.     Comments: Answers questions appropriately. CN 2-12 intact. States he feels great    UC Treatments / Results  Labs (all labs ordered are listed, but only abnormal results are displayed) Labs Reviewed - No data to display  EKG   Radiology DG Tibia/Fibula Left Result Date: 12/13/2023 CLINICAL DATA:  Left leg pain after fall last week. EXAM: LEFT TIBIA AND FIBULA - 2 VIEW COMPARISON:  None Available. FINDINGS: There is no evidence of fracture or other focal bone lesions. Vascular calcifications are noted. IMPRESSION: No acute abnormality seen. Electronically Signed   By: Lynwood Landy Raddle M.D.   On: 12/13/2023 10:27    Procedures Procedures (including critical care time)  Medications Ordered in  UC Medications - No data to display  Initial Impression / Assessment and Plan / UC Course  I have reviewed the triage vital signs and the nursing notes.  Pertinent labs & imaging results that were available during my care of the patient were reviewed by me and considered in my medical decision making (see chart for details).  Blood pressure 88/52 today and same on recheck. I have discussed with patient that he needs to discuss his low readings with his cardiologist again.  He may need to have medications adjusted.  Reassuring he is not having symptoms. Strict fall precautions, ED precautions.   X-ray of left lower leg without bony abnormality. Images independently reviewed by me, agree with radiology interpretation. Discussed with patient. ACE wrap was applied in clinic and patient reports this does provide a good amount or relief, support to the leg. Ambulates out of clinic with his cane.  Continue tylenol  at home, elevate and ice. Will follow up with PCP if symptoms persisting Return and ED precautions   Final Clinical Impressions(s) /  UC Diagnoses   Final diagnoses:  Injury of left lower extremity, initial encounter     Discharge Instructions      Xray does not show any broken bones!  Elevate the leg to reduce swelling Apply ice for 20 minutes, several times daily Continue tylenol   Please talk with your cardiologist at your visit in 3 days regarding your low blood pressures. They may need to adjust some of your medications.      ED Prescriptions   None    PDMP not reviewed this encounter.   Johnnie Goynes, [place], [place] 12/13/23 1103

## 2023-12-13 NOTE — Discharge Instructions (Signed)
 Xray does not show any broken bones!  Elevate the leg to reduce swelling Apply ice for 20 minutes, several times daily Continue tylenol   Please talk with your cardiologist at your visit in 3 days regarding your low blood pressures. They may need to adjust some of your medications.

## 2023-12-13 NOTE — ED Triage Notes (Signed)
 Pt presents c/o L leg injury x 7 days. Pt reports he fell getting out of the shower last Sunday. Pt states when putting pressure on his left leg it hurts and burns. Pt has tried OTC TyLenol  to help with sxs.

## 2023-12-16 ENCOUNTER — Ambulatory Visit: Payer: Medicare HMO

## 2023-12-16 DIAGNOSIS — I5022 Chronic systolic (congestive) heart failure: Secondary | ICD-10-CM

## 2023-12-16 LAB — CUP PACEART REMOTE DEVICE CHECK
Battery Remaining Longevity: 108 mo
Battery Voltage: 3.02 V
Brady Statistic RV Percent Paced: 0.14 %
Date Time Interrogation Session: 20250910012201
HighPow Impedance: 81 Ohm
Implantable Lead Connection Status: 753985
Implantable Lead Implant Date: 20221214
Implantable Lead Location: 753860
Implantable Pulse Generator Implant Date: 20221214
Lead Channel Impedance Value: 304 Ohm
Lead Channel Impedance Value: 399 Ohm
Lead Channel Pacing Threshold Amplitude: 0.75 V
Lead Channel Pacing Threshold Pulse Width: 0.4 ms
Lead Channel Sensing Intrinsic Amplitude: 13 mV
Lead Channel Sensing Intrinsic Amplitude: 13 mV
Lead Channel Setting Pacing Amplitude: 1.5 V
Lead Channel Setting Pacing Pulse Width: 0.4 ms
Lead Channel Setting Sensing Sensitivity: 0.3 mV
Zone Setting Status: 755011
Zone Setting Status: 755011

## 2023-12-17 ENCOUNTER — Encounter: Payer: Self-pay | Admitting: Physical Medicine and Rehabilitation

## 2023-12-17 ENCOUNTER — Ambulatory Visit: Admitting: Physical Medicine and Rehabilitation

## 2023-12-17 DIAGNOSIS — M48062 Spinal stenosis, lumbar region with neurogenic claudication: Secondary | ICD-10-CM

## 2023-12-17 DIAGNOSIS — G8929 Other chronic pain: Secondary | ICD-10-CM

## 2023-12-17 DIAGNOSIS — M5416 Radiculopathy, lumbar region: Secondary | ICD-10-CM | POA: Diagnosis not present

## 2023-12-17 DIAGNOSIS — M5441 Lumbago with sciatica, right side: Secondary | ICD-10-CM

## 2023-12-17 NOTE — Progress Notes (Signed)
 Jacob Harrison - 83 y.o. male MRN [id number]  Date of birth: 1940/08/10  Office Visit Note: Visit Date: 12/17/2023 PCP: Chandra Toribio POUR, MD Referred by: Chandra Toribio POUR, MD  Subjective: Chief Complaint  Patient presents with   Lower Back - Follow-up   HPI: Jacob Harrison is a 83 y.o. male who comes in today for evaluation of chronic, worsening and severe bilateral lower back pain radiating to buttocks and down right lateral leg to knee. Also reports numbness and weakness to bilateral legs with prolonged standing. He is here today in follow up from recent bilateral L4 transforaminal epidural steroid injection performed in our office on 11/26/2023. He reports greater than 50% relief of pain that continues to sustain, pain is 1 out of 10 today. He also reports increased functional ability post injection. Recent lumbar MRI imaging shows lumbar spondylosis and degenerative disc disease, causing prominent impingement at L4-L5; moderate to prominent impingement at L2-L3; and moderate impingement at L3-L4 and L5-S1. 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 History of L3-L4 and L4-L5 lumbar decompression with Dr. Barbarann in 2020. Patient denies focal weakness.  Of note, states he tripped and fell in shower a few weeks ago and injured left leg. He was seen at Urgent Care on 12/13/2023 (about 1 week post fall). Recent x-ray of left tibia/fibula negative for acute fractures. He is doing much better now.      Review of Systems  Musculoskeletal:  Positive for back pain.  Neurological:  Negative for tingling, sensory change, focal weakness and weakness.  All other systems reviewed and are negative.  Otherwise per HPI.  Assessment & Plan: Visit Diagnoses:    ICD-10-CM   1. Chronic bilateral low back pain with right-sided sciatica  G89.29    M54.41     2. Radiculopathy, lumbar region  M54.16     3. Spinal stenosis of lumbar region with neurogenic  claudication  M48.062        Plan: Findings:  Chronic, worsening and severe bilateral lower back pain radiating to buttocks and down right lateral leg to knee. Chronic paresthesias to bilateral legs with prolonged standing. Significant and sustained relief of pain with recent bilateral L4 transforaminal epidural steroid injection. I do think his symptoms are consistent with neurogenic claudication as a result of spinal canal stenosis. There is severe spinal canal stenosis at L4-L5. At this point, would continue to monitor, we can repeat injections infrequently as needed. He has no questions at this time. Should his pain persist would consider referral to our spine surgeon Dr. Ozell Ada. No red flag symptoms noted upon exam today.     Meds & Orders: No orders of the defined types were placed in this encounter.  No orders of the defined types were placed in this encounter.   Follow-up: Return if symptoms worsen or fail to improve.   Procedures: No procedures performed      Clinical History: CLINICAL DATA:  Low back pain with chronic sciatica   EXAM: MRI LUMBAR SPINE WITHOUT CONTRAST   TECHNIQUE: Multiplanar, multisequence MR imaging of the lumbar spine was performed. No intravenous contrast was administered.   COMPARISON:  09/22/2017, and radiograph 08/26/2023   FINDINGS: Segmentation: The lowest lumbar type non-rib-bearing vertebra is labeled as L5.   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 3 mm degenerative retrolisthesis L2-3 L3-4.   Vertebrae: Disc desiccation throughout the lumbar spine with notable loss of intervertebral disc height at all levels between  L2 and S1. Type 2 degenerative endplate findings at L2-3, L4-5, and L5-S1. Mild facet edema on the left at L4-5.   Conus medullaris and cauda equina: Conus extends to the L1-2 level. Conus and cauda equina appear normal.   Paraspinal and other soft tissues: Unremarkable   Disc  levels:   L1-2: No significant impingement. Mild disc bulge and degenerative facet arthropathy.   L2-3: Moderate to severe central narrowing of the thecal sac with moderate displacement of the left L2 nerve in the lateral extraforaminal space and moderate bilateral subarticular lateral recess stenosis. Ligamentum flavum redundancy is also present.   L3-4: Moderate right foraminal stenosis and moderate bilateral subarticular lateral recess stenosis along with moderate displacement of the left L3 nerve in the lateral extraforaminal space due to disc bulge, right foraminal disc protrusion, intervertebral spurring, and facet arthropathy along with a suspected small (0.6 cm) left synovial cyst. Left facet joint effusion at this level. Postoperative findings along the posterior elements at this level.   L4-5: Prominent central narrowing of the thecal sac with moderate right foraminal stenosis and moderate bilateral subarticular lateral recess stenosis due to disc osteophyte complex, facet arthropathy, and right ligamentum flavum redundancy. Postoperative findings along the posterior elements at this level.   L5-S1: Moderate right subarticular lateral recess stenosis due to disc osteophyte complex.   IMPRESSION: 1. Lumbar spondylosis and degenerative disc disease, causing prominent impingement at L4-5; moderate to prominent impingement at L2-3; and moderate impingement at L3-4 and L5-S1, as detailed above. 2. 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 3. Postoperative findings along the posterior elements at L3-4 and L4-5.     Electronically Signed   By: Ryan Salvage M.D.   On: 10/11/2023 16:24   He reports that he has never smoked. He has never been exposed to tobacco smoke. He has never used smokeless tobacco.  Recent Labs    08/19/23 1149  HGBA1C 5.6    Objective:  VS:  HT:    WT:   BMI:     BP:   HR: bpm  TEMP: ( )  RESP:   Physical Exam Vitals and nursing note reviewed.  HENT:     Head: Normocephalic and atraumatic.     Right Ear: External ear normal.     Left Ear: External ear normal.     Nose: Nose normal.     Mouth/Throat:     Mouth: Mucous membranes are moist.  Eyes:     Extraocular Movements: Extraocular movements intact.  Cardiovascular:     Rate and Rhythm: Normal rate.     Pulses: Normal pulses.  Pulmonary:     Effort: Pulmonary effort is normal.  Abdominal:     General: Abdomen is flat. There is no distension.  Musculoskeletal:        General: Tenderness present.     Cervical back: Normal range of motion.     Comments: Patient is slow to rise from seated position to standing. Good lumbar range of motion. No pain noted with facet loading. 5/5 strength noted with bilateral hip flexion, knee flexion/extension, ankle dorsiflexion/plantarflexion and EHL. No clonus noted bilaterally. No pain upon palpation of greater trochanters. No pain with internal/external rotation of bilateral hips. Sensation intact bilaterally. Negative slump test bilaterally. Ambulates with cane, gait slow and unsteady.    Skin:    General: Skin is warm and dry.     Capillary Refill: Capillary refill takes less than 2 seconds.  Neurological:     Mental  Status: He is alert.     Gait: Gait abnormal.  Psychiatric:        Mood and Affect: Mood normal.        Behavior: Behavior normal.     Ortho Exam  Imaging: No results found.  Past Medical/Family/Surgical/Social History: Medications & Allergies reviewed per EMR, new medications updated. 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   Muscle weakness 11/19/2022   Statins contraindicated 10/16/2022   Paroxysmal atrial fibrillation (HCC) 08/27/2022   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) 08/27/2022   ICD (implantable cardioverter-defibrillator) in place 08/05/2022   Statin-induced  rhabdomyolysis 07/05/2022   Transaminitis 07/05/2022   Chronic low back pain 07/02/2022   Dyspnea on exertion 05/22/2022   Chronic HFrEF (heart failure with reduced ejection fraction) (HCC) 12/13/2021   Acute on chronic heart failure with preserved ejection fraction (HFpEF) (HCC) 10/08/2020   Non-ST elevation (NSTEMI) myocardial infarction (HCC) 10/08/2020   CHF (congestive heart failure) (HCC) 10/04/2020   Hx of CABG    Accelerating angina (HCC) 10/03/2020   Rotator cuff disorder 08/09/2019   BPH associated with nocturia 11/29/2018   Status post lumbar spine surgery for decompression of spinal cord 10/29/2018   Spinal stenosis of lumbar region 08/02/2018   Atypical mole 07/30/2017   Sinus bradycardia 03/18/2017   Ventral hernia without obstruction or gangrene 02/25/2017   (HFpEF) heart failure with preserved ejection fraction (HCC) 09/28/2016   Ischemic cardiomyopathy 09/28/2016   Vitamin D  deficiency 06/27/2016   Vitamin B deficiency 06/27/2016   Type 2 diabetes mellitus without complication, without long-term current use of insulin  (HCC) 05/20/2016   Essential hypertension 05/20/2016   Hyperlipidemia associated with type 2 diabetes mellitus (HCC) 05/20/2016   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 10/2016 Echo: EF 65-70%, mild LVH, mildly dil LA. RV fxn nl.   Arthritis    BPH (benign prostatic hyperplasia)    Complication of anesthesia    dificult entubation,narrow   Coronary artery disease    a. 2001 CABG x 3 ([place] ): LIMA->LAD, VG->LCX, VG->RPDA; b. 04/05/2010 Cath ([place]): LM 57m, LAD 167m, LCX 74m, RCA 100p, LIMA->LAD atretic, VG->LCX 100, VG->RCA 80ost, 70d; c. 04/2010 Redo CABG x 2 ([place]): VG->LAD, VG->RPDA; d. 10/2012 Cath [place]): LM 20-30, LAD 100/95-72m, LCX 40-68m, RCA 100p, 80-100d, RPDA 99, RPL1 95, VG->LAD  ok, VG->RPDA ok, EF 67%; e. 11/2016 MV: EF 48%, No ischemia.   Diabetes mellitus without complication (HCC)    Type II   Dysrhythmia    Heart murmur    Hyperlipidemia    Hypertension    Ischemic cardiomyopathy    a. 03/2021 s/p MDT Visia AF MRI VR SureScan single lead AICD (ser# [id number]).   Myocardial infarction [facility])    Scoliosis    Spinal stenosis    Family History  Problem Relation Age of Onset   Diabetes Mother    Hyperlipidemia Mother    Cancer Father        colon   Skin cancer Brother    Alcohol abuse Maternal Uncle    Diabetes Maternal Uncle    Hyperlipidemia Maternal Uncle    Cancer Paternal Uncle        colon   Past Surgical History:  Procedure Laterality Date   BACK SURGERY  2020  laminectomy L4 L5   CARDIAC CATHETERIZATION     CATARACT EXTRACTION, BILATERAL  10/2022   CORONARY ARTERY BYPASS GRAFT     x 2 (2001 and redo in late 2011 or early 2012)   CYSTOSCOPY WITH URETHRAL DILATATION N/A 01/13/2023   Procedure: CYSTOSCOPY WITH URETHRAL DILATATION;  Surgeon: Devere Lonni Righter, MD;  Location: [facility] ORS;  Service: Urology;  Laterality: N/A;  30 MINUTES   HERNIA REPAIR Left    inguinal   ICD IMPLANT N/A 03/20/2021   Procedure: ICD IMPLANT;  Surgeon: Cindie Ole DASEN, MD;  Location: [facility] INVASIVE CV LAB;  Service: Cardiovascular;  Laterality: N/A;   LEFT HEART CATH AND CORS/GRAFTS ANGIOGRAPHY N/A 10/04/2020   Procedure: LEFT HEART CATH AND CORS/GRAFTS ANGIOGRAPHY;  Surgeon: Anner Alm ORN, MD;  Location: [facility] INVASIVE CV LAB;  Service: Cardiovascular;  Laterality: N/A;   LUMBAR LAMINECTOMY/DECOMPRESSION MICRODISCECTOMY N/A 10/20/2018   Procedure: L3-4, L4-5 LUMBAR DECOMPRESSION with dural repair.;  Surgeon: Barbarann Oneil BROCKS, MD;  Location: [facility] OR;  Service: Orthopedics;  Laterality: N/A;   RIGHT/LEFT HEART CATH AND CORONARY/GRAFT ANGIOGRAPHY N/A 05/27/2022   Procedure: RIGHT/LEFT HEART CATH AND CORONARY/GRAFT ANGIOGRAPHY;  Surgeon: Mady Lonni, MD;   Location: [facility] INVASIVE CV LAB;  Service: Cardiovascular;  Laterality: N/A;   TONSILLECTOMY AND ADENOIDECTOMY     TRANSURETHRAL RESECTION OF PROSTATE  1990   Social History   Occupational History   Occupation: retired  Tobacco Use   Smoking status: Never    Passive exposure: Never   Smokeless tobacco: Never  Vaping Use   Vaping status: Never Used  Substance and Sexual Activity   Alcohol use: Not Currently    Comment: 1-2 drinks on occasion previously 5-7 drinks a week (was 10-15 drinks/week)   Drug use: Never   Sexual activity: Not Currently    Birth control/protection: None

## 2023-12-17 NOTE — Progress Notes (Signed)
 Pain Scale   Average Pain 1 Patient advising his lower back pain is much better after his injection.        +Driver, -BT, -Dye Allergies.

## 2023-12-20 ENCOUNTER — Ambulatory Visit: Payer: Self-pay | Admitting: Cardiology

## 2023-12-22 ENCOUNTER — Other Ambulatory Visit: Payer: Self-pay | Admitting: Internal Medicine

## 2023-12-22 DIAGNOSIS — I480 Paroxysmal atrial fibrillation: Secondary | ICD-10-CM

## 2023-12-22 NOTE — Telephone Encounter (Signed)
 Prescription refill request for Eliquis  received. Indication: PAF Last office visit: 10/21/23  A Sabharwal DO Scr: 1.04 on 11/03/23  Epic Age: 83 Weight: 77.6kg  Based on above findings Eliquis  5mg  twice daily is the appropriate dose.  Refill approved.

## 2023-12-25 NOTE — Progress Notes (Signed)
Remote ICD Transmission.

## 2023-12-25 NOTE — Progress Notes (Signed)
 [facility] Note  PCP: Chandra Toribio POUR, MD PCP-Cardiologist: Lonni Hanson, MD HF-Cardiologist: [name], DO  HPI:  Jacob Harrison is a 83 y.o. male with CAD s/p CABG w/ re-do CABG, T2DM, chronic HFrEF 2/2 ischemic cardiomyopathy s/p ICD, pAfib, hyperlipidemia with hx of rhabdo (statin), spinal stenosis, and atrial fibrillation presenting who established care with AHF clinic 10/21/23.    Cardiac History:  - CABG in 2001 with re-do CABG in 2012 - LVEF 65% until 2021 -  Drop in EF to 30-35% by 11/2020 due to graft closure.  - ICD implant 03/2021 by Dr. Cindie  Seen by Dr Commander 10/21/23. Spironolactone  12.5 mg daily added.  ED visit 12/13/23 for a fall the patient claims was due to slippery bathroom floor. Patient denied x 2 any overt dizziness.  Today Jacob Harrison returns to Heart Failure Clinic for pharmacist medication titration. Reports feeling well aside from back pain. Reports DOE is stable, episodes of chest pain less frequent, LEE unchanged from last visit. Denies any lightheadedness despite chronically low BPs. Primary complaint remains back and knee pain. Denies fatigue palpitations orthopnea PND.  Reports being able to complete all activities of daily living (ADLs). Is active throughout the day, walks or stationary bikes most days. Weight at home is stablefrom last visit. Takes furosemide  80 mg BID. Appetite is good. Does follow a low sodium diet. BP at home 80-90s/50-60s. Denies orthostasis.   Current Heart Failure Medications: Loop diuretic: furosemide  80 mg BID Beta-Blocker: carvedilol  6.25 mg BID   ACEI/ARB/ARNI: losartan  12.5 mg daily   MRA: spironolactone  12.5 mg daily SGLT2i: Jardiance  10 mg daily Other: isosorbide  ER 30 mg BID  Has the patient been experiencing any side effects to the medications prescribed? No  Does the patient have any problems obtaining medications due to transportation or finances? No  Understanding  of regimen: Excellent  Understanding of indications: Excellent  Potential of adherence: Excellent  Patient understands to avoid NSAIDs.  Patient understands to avoid decongestants.  Pertinent Lab Values: Creatinine, Ser  Date Value Ref Range Status  11/03/2023 1.04 0.61 - 1.24 mg/dL Final   BUN  Date Value Ref Range Status  11/03/2023 33 (H) 8 - 23 mg/dL Final  98/93/7974 22 8 - 27 mg/dL Final   Potassium  Date Value Ref Range Status  11/03/2023 4.8 3.5 - 5.1 mmol/L Final   Sodium  Date Value Ref Range Status  11/03/2023 131 (L) 135 - 145 mmol/L Final  04/13/2023 138 134 - 144 mmol/L Final   B Natriuretic Peptide  Date Value Ref Range Status  11/03/2023 112.1 (H) 0.0 - 100.0 pg/mL Final    Comment:    Performed at [facility], [address] [place], [place] [address]   Magnesium  Date Value Ref Range Status  02/25/2019 1.9 1.6 - 2.3 mg/dL Final   TSH  Date Value Ref Range Status  07/05/2022 2.307 0.350 - 4.500 uIU/mL Final    Comment:    Performed by a 3rd Generation assay with a functional sensitivity of <=0.01 uIU/mL. Performed at [facility], [address] [place], [place] [address]   08/31/2020 2.180 0.450 - 4.500 uIU/mL Final    Vital Signs: Today's Vitals   12/29/23 1132  BP: (!) 82/56  Pulse: 66  SpO2: 96%  Weight: 172 lb 3.2 oz (78.1 kg)   Assessment/Plan: Heart Failure with reduced fraction - Etiology & History: ischemic cardiomyopathy - NYHA Class: NYHA IIB; limited by low back pain.  -  Volume status: chronic LE edema after harvesting veins. Weight is mildly up, however, appears euvolemic to hypovolemic and BP is low. Decrease furosemide  to 80 mg in the morning and 40 mg in the evening, hypotension may be due to hypovolemia - GDMT:  > RAASi: continue losartan  12.5mg  daily. Limited by SBP, may require holding altogether if BP remains in 80s. Patient will call back with home BP log. > Beta-blocker: continue carvedilol   6.25 mg BID. Decreasing dose vs swapping to bisoprolol may help with hypotension, however, hesitant to change with controlled angina.  > MRA: spironolactone  12.5 mg daily. Repeat BMET and BNP today. > SGLT2i: Jardiance  10mg  daily - ICD: followed by Dr. Cindie; device interrogation from 09/14/23 with less than 1% v pacing.  - Advanced therapies: not a candidate; stable functional status.     2. CAD - LHC in 05/2022 with CTO of the mid LAD and proximal RCA; occlusion of all bypass grafts.  - Anti-anginal regimen: Coreg , Imdur , Ranexa .    3. Atrial fibrillation  - NSR today  - continue Apixaban  5mg  BID   4. HLD - hx of rhabdo - continue Repatha  - repeat lipid panel ordered but still not drawn.  Follow up: 4 months with MD  Please do not hesitate to reach out with questions or concerns,  Jaun Bash, PharmD, CPP, BCPS, [facility] Heart Failure Pharmacist  Phone - [phone] 12/29/2023 11:58 AM

## 2023-12-28 ENCOUNTER — Telehealth: Payer: Self-pay | Admitting: Pharmacist

## 2023-12-28 NOTE — Telephone Encounter (Signed)
 Called to confirm/remind patient of their appointment at the [facility] Clinic on 12/29/23.   Appointment:   [x] Confirmed  [] Left mess   [] No answer/No voice mail  [] VM Full/unable to leave message  [] Phone not in service  Patient reminded to bring all medications and/or complete list.  Confirmed patient has transportation. Gave directions, instructed to utilize valet parking.

## 2023-12-29 ENCOUNTER — Ambulatory Visit: Payer: Self-pay | Admitting: Pharmacist

## 2023-12-29 ENCOUNTER — Ambulatory Visit: Admitting: Pharmacist

## 2023-12-29 ENCOUNTER — Other Ambulatory Visit
Admission: RE | Admit: 2023-12-29 | Discharge: 2023-12-29 | Disposition: A | Discharge status: Home / Self Care | Source: Ambulatory Visit | Attending: Internal Medicine | Admitting: Internal Medicine

## 2023-12-29 VITALS — BP 82/56 | HR 66 | Wt 172.2 lb

## 2023-12-29 DIAGNOSIS — I2510 Atherosclerotic heart disease of native coronary artery without angina pectoris: Secondary | ICD-10-CM | POA: Insufficient documentation

## 2023-12-29 DIAGNOSIS — I5022 Chronic systolic (congestive) heart failure: Secondary | ICD-10-CM

## 2023-12-29 DIAGNOSIS — Z951 Presence of aortocoronary bypass graft: Secondary | ICD-10-CM | POA: Insufficient documentation

## 2023-12-29 DIAGNOSIS — I4891 Unspecified atrial fibrillation: Secondary | ICD-10-CM | POA: Diagnosis not present

## 2023-12-29 DIAGNOSIS — Z7984 Long term (current) use of oral hypoglycemic drugs: Secondary | ICD-10-CM | POA: Insufficient documentation

## 2023-12-29 DIAGNOSIS — Z95810 Presence of automatic (implantable) cardiac defibrillator: Secondary | ICD-10-CM | POA: Diagnosis not present

## 2023-12-29 DIAGNOSIS — E119 Type 2 diabetes mellitus without complications: Secondary | ICD-10-CM | POA: Diagnosis not present

## 2023-12-29 DIAGNOSIS — Z79899 Other long term (current) drug therapy: Secondary | ICD-10-CM | POA: Insufficient documentation

## 2023-12-29 DIAGNOSIS — E785 Hyperlipidemia, unspecified: Secondary | ICD-10-CM | POA: Insufficient documentation

## 2023-12-29 DIAGNOSIS — I255 Ischemic cardiomyopathy: Secondary | ICD-10-CM | POA: Diagnosis not present

## 2023-12-29 DIAGNOSIS — M48061 Spinal stenosis, lumbar region without neurogenic claudication: Secondary | ICD-10-CM | POA: Insufficient documentation

## 2023-12-29 LAB — BASIC METABOLIC PANEL WITH GFR
Anion gap: 11 (ref 5–15)
BUN: 30 mg/dL — ABNORMAL HIGH (ref 8–23)
CO2: 27 mmol/L (ref 22–32)
Calcium: 9.1 mg/dL (ref 8.9–10.3)
Chloride: 93 mmol/L — ABNORMAL LOW (ref 98–111)
Creatinine, Ser: 0.95 mg/dL (ref 0.61–1.24)
GFR, Estimated: >60 mL/min (ref >60)
Glucose, Bld: 134 mg/dL — ABNORMAL HIGH (ref 70–99)
Potassium: 4.3 mmol/L (ref 3.5–5.1)
Sodium: 131 mmol/L — ABNORMAL LOW (ref 135–145)

## 2023-12-29 LAB — BRAIN NATRIURETIC PEPTIDE: B Natriuretic Peptide: 136.4 pg/mL — ABNORMAL HIGH (ref 0.0–100.0)

## 2023-12-29 MED ORDER — FUROSEMIDE 80 MG PO TABS: ORAL_TABLET | ORAL | 3 refills | Status: AC

## 2023-12-29 NOTE — Patient Instructions (Addendum)
 It was a pleasure seeing you today!  MEDICATIONS: -Change furosemide  to 80 mg in the morning and 40 mg in the evening. You may take a total of 80 mg in the evening if your weight is up ~3 lbs and you have shortness of breath or swelling. -Call if you have questions about your medications.  LABS: -We will call you if your labs need attention.  NEXT APPOINTMENT: Return to clinic in 4 months to see Dr. Gardenia.  In general, to take care of your heart failure: -Limit your fluid intake to 2 Liters (half-gallon) per day.   -Limit your salt intake to ideally 2-3 grams (2000-3000 mg) per day. -Weigh yourself daily and record, and bring that weight diary to your next appointment.  (Weight gain of 2-3 pounds in 1 day typically means fluid weight.) -The medications for your heart are to help your heart and help you live longer.   -Please contact us  before stopping any of your heart medications.  Call the clinic at [phone] with questions or to reschedule future appointments.

## 2024-01-04 ENCOUNTER — Other Ambulatory Visit: Payer: Self-pay | Admitting: Internal Medicine

## 2024-01-04 DIAGNOSIS — E1169 Type 2 diabetes mellitus with other specified complication: Secondary | ICD-10-CM

## 2024-01-04 DIAGNOSIS — I25118 Atherosclerotic heart disease of native coronary artery with other forms of angina pectoris: Secondary | ICD-10-CM

## 2024-01-18 ENCOUNTER — Other Ambulatory Visit

## 2024-01-18 DIAGNOSIS — E119 Type 2 diabetes mellitus without complications: Secondary | ICD-10-CM

## 2024-01-18 DIAGNOSIS — E785 Hyperlipidemia, unspecified: Secondary | ICD-10-CM | POA: Diagnosis not present

## 2024-01-18 DIAGNOSIS — E1169 Type 2 diabetes mellitus with other specified complication: Secondary | ICD-10-CM | POA: Diagnosis not present

## 2024-01-19 ENCOUNTER — Ambulatory Visit: Payer: Self-pay | Admitting: Family Medicine

## 2024-01-19 LAB — COMPREHENSIVE METABOLIC PANEL WITH GFR
ALT: 28 IU/L (ref 0–44)
AST: 27 IU/L (ref 0–40)
Albumin: 4.5 g/dL (ref 3.8–4.8)
Alkaline Phosphatase: 96 IU/L (ref 47–123)
BUN/Creatinine Ratio: 24 (ref 10–24)
BUN: 25 mg/dL (ref 8–27)
Bilirubin Total: 0.8 mg/dL (ref 0.0–1.2)
CO2: 24 mmol/L (ref 20–29)
Calcium: 9.4 mg/dL (ref 8.6–10.2)
Chloride: 92 mmol/L — ABNORMAL LOW (ref 96–106)
Creatinine, Ser: 1.06 mg/dL (ref 0.76–1.27)
Globulin, Total: 2.2 g/dL (ref 1.5–4.5)
Glucose: 92 mg/dL (ref 70–99)
Potassium: 4.5 mmol/L (ref 3.5–5.2)
Sodium: 135 mmol/L (ref 134–144)
Total Protein: 6.7 g/dL (ref 6.0–8.5)
eGFR: 71 mL/min/1.73 (ref >59)

## 2024-01-19 LAB — LIPID PANEL
Chol/HDL Ratio: 2.8 ratio (ref 0.0–5.0)
Cholesterol, Total: 197 mg/dL (ref 100–199)
HDL: 70 mg/dL (ref >39)
LDL Chol Calc (NIH): 107 mg/dL — ABNORMAL HIGH (ref 0–99)
Triglycerides: 114 mg/dL (ref 0–149)
VLDL Cholesterol Cal: 20 mg/dL (ref 5–40)

## 2024-01-19 LAB — HEMOGLOBIN A1C
Est. average glucose Bld gHb Est-mCnc: 111 mg/dL
Hgb A1c MFr Bld: 5.5 % (ref 4.8–5.6)

## 2024-01-25 ENCOUNTER — Encounter: Payer: Self-pay | Admitting: Family Medicine

## 2024-01-25 ENCOUNTER — Ambulatory Visit: Admitting: Family Medicine

## 2024-01-25 VITALS — BP 108/66 | HR 66 | Ht 67.0 in | Wt 173.1 lb

## 2024-01-25 DIAGNOSIS — I480 Paroxysmal atrial fibrillation: Secondary | ICD-10-CM | POA: Diagnosis not present

## 2024-01-25 DIAGNOSIS — D6869 Other thrombophilia: Secondary | ICD-10-CM | POA: Diagnosis not present

## 2024-01-25 DIAGNOSIS — E1169 Type 2 diabetes mellitus with other specified complication: Secondary | ICD-10-CM | POA: Diagnosis not present

## 2024-01-25 DIAGNOSIS — R635 Abnormal weight gain: Secondary | ICD-10-CM | POA: Insufficient documentation

## 2024-01-25 DIAGNOSIS — G8929 Other chronic pain: Secondary | ICD-10-CM

## 2024-01-25 DIAGNOSIS — E785 Hyperlipidemia, unspecified: Secondary | ICD-10-CM | POA: Diagnosis not present

## 2024-01-25 DIAGNOSIS — Z0000 Encounter for general adult medical examination without abnormal findings: Secondary | ICD-10-CM | POA: Diagnosis not present

## 2024-01-25 DIAGNOSIS — Z23 Encounter for immunization: Secondary | ICD-10-CM | POA: Diagnosis not present

## 2024-01-25 DIAGNOSIS — Z7984 Long term (current) use of oral hypoglycemic drugs: Secondary | ICD-10-CM | POA: Diagnosis not present

## 2024-01-25 DIAGNOSIS — I5032 Chronic diastolic (congestive) heart failure: Secondary | ICD-10-CM | POA: Diagnosis not present

## 2024-01-25 DIAGNOSIS — M545 Low back pain, unspecified: Secondary | ICD-10-CM | POA: Diagnosis not present

## 2024-01-25 MED ORDER — DULOXETINE HCL 30 MG PO CPEP: 30.0000 mg | ORAL_CAPSULE | Freq: Every day | ORAL | 2 refills | Status: DC

## 2024-01-25 NOTE — Patient Instructions (Signed)
 It was nice to see you today,  We addressed the following topics today: - I have sent a prescription for Cymbalta  to your pharmacy. Do not start taking it until you have finished weaning off the Lexapro  as we discussed. - The instructions for weaning off Lexapro  are: take a half pill daily for one week, then take a half pill every other day for one week, and then you can stop. - Please schedule a follow-up appointment in about two months to reassess your pain, mood, and weight. - When you see your cardiologist, please discuss your cholesterol and ask them about a medication called bempedoic acid. - If you change your mind and want to consider the weight loss medication [facility]), please let me know. - Continue to weigh yourself daily to monitor for any rapid fluid weight gain. - Try to reduce your portion sizes or the number of meals you eat per day to help manage your weight.  Have a great day,  Rolan Slain, MD

## 2024-01-25 NOTE — Assessment & Plan Note (Signed)
 History of ~15 lb weight gain this year with increased appetite. BMI is 27. Diet is reportedly healthy but portion sizes have increased. Discussed GLP-1 agonist [facility]) as an option given cardiovascular disease, but patient is hesitant at this time. - Plan to trial dietary modification by reducing portion sizes and/or meal frequency from five to four times per day. - Will reassess weight at follow-up in two months.

## 2024-01-25 NOTE — Assessment & Plan Note (Signed)
 Chronic Low Back and Leg Pain (Lumbar Spinal Stenosis) Chronic pain secondary to lumbar spinal stenosis, limiting activity. Recent epidural steroid injection was beneficial. Taking high doses of acetaminophen  daily. Discussed switching from Lexapro  to duloxetine  (Cymbalta ) for potential dual benefit for mood and chronic pain. - Start Cymbalta  30 mg daily after tapering off Lexapro . - Taper Lexapro  by taking half a pill (5 mg) daily for one week, then half a pill every other day for one week, then stop. - Follow up in 2 months to assess efficacy and for any adjustments. - Continue current pain management plan with Dr. Eldonna, including potential for another epidural steroid injection.

## 2024-01-25 NOTE — Assessment & Plan Note (Signed)
 Continue eliquis 

## 2024-01-25 NOTE — Assessment & Plan Note (Signed)
 Continues to follow with cardiology regularly.  Recently added spironolactone  to coreg , jardiance , losartan , and lasix .

## 2024-01-25 NOTE — Assessment & Plan Note (Signed)
 LDL is elevated at 107 despite being on Repatha . This is an increase from 58 one year ago, likely related to weight gain. History of statin intolerance (rhabdomyolysis) is a contraindication to Zetia  as well. - Continue Repatha . - Will discuss adding bempedoic acid with cardiology at upcoming appointment as a potential option for further LDL lowering.

## 2024-01-25 NOTE — Progress Notes (Signed)
 Annual physical  Subjective   Patient ID: Jacob Harrison, male    DOB: [birth year]  Age: 83 y.o. MRN: [id number]  Chief Complaint  Patient presents with   Annual Exam   HPI Jacob Harrison is a 83 y.o. old male here  for annual exam.   Subjective - Weight gain. Reports approximately 15 lb weight gain since the beginning of the year with constant hunger. Diet composition unchanged but volume has increased. Diet is low-carb with adequate protein, fruits, and vegetables. Salt intake is monitored at 2,000-2,500 mg/day. Eats five meals per day. Expressed hesitation about starting weight loss medications. - Chronic low back and leg pain. Pain limits ability to exercise. Has diagnoses of lumbar spinal stenosis and scoliosis; surgery not recommended by orthopedics. Has been treated by pain management with an epidural steroid injection which provided significant relief, improving standing tolerance from <10 minutes to >30 minutes. Plans for a repeat injection before the end of the year. Uses topical Voltaren  and lidocaine  patches as needed. Taking acetaminophen  2-3 grams daily. - Dyspnea. Reports exertional dyspnea only with strenuous activity on recumbent exercise bike. No shortness of breath with normal daily activities like walking. No significant concern from patient or other providers regarding this symptom. - Cholesterol. LDL was 107 on recent labs, up from 63 a year ago.  Medications Carvedilol , vitamin B12, vitamin D , Eliquis , Jardiance , Lexapro  (to be tapered), Repatha , finasteride , Lasix  80 mg in the morning and 40 mg in the afternoon, spironolactone  12.5 mg (half tablet), Isordil  (for angina), Ranexa  (for angina), Flomax, multivitamin, Losartan  12.5 mg (half tablet), and magnesium supplement. Intolerant of statins and Zetia  due to rhabdomyolysis history.  PMH, PSH, FH, Social Hx PMHx: Lumbar spinal stenosis, scoliosis, hyperlipidemia, history of rhabdomyolysis, cardiovascular disease,  angina, enlarged prostate, hypertension. Prior heart attack. PSH: Left chest battery pack (pacemaker/defibrillator). Social Hx: Drinks orange juice and low-sodium V8 juice occasionally. Primarily drinks unsweetened tea, diet soda, and water . Does not drink regular soda or sugary drinks.  ROS Pertinent positives: weight gain, increased appetite, chronic low back pain, leg pain, exertional dyspnea. Denies shortness of breath at rest or with mild activity. Reports loose stools since starting spironolactone , but not diarrhea. Pertinent negatives: No chest pain/angina on a daily basis.   The ASCVD Risk score (Arnett DK, et al., 2019) failed to calculate for the following reasons:   The 2019 ASCVD risk score is only valid for ages 43 to 63   Risk score cannot be calculated because patient has a medical history suggesting prior/existing ASCVD  Health Maintenance Due  Topic Date Due   Zoster Vaccines- Shingrix (1 of 2) Never done   COVID-19 Vaccine (3 - Pfizer risk series) 06/29/2019   OPHTHALMOLOGY EXAM  07/28/2023   FOOT EXAM  08/19/2023      Objective:     BP 108/66   Pulse 66   Ht 5' 7 (1.702 m)   Wt 173 lb 1.9 oz (78.5 kg)   SpO2 98%   BMI 27.11 kg/m    Physical Exam Gen: alert, oriented HEENT: perrla, eomi, mmm CV: rrr, no murmur Pulm: lctab. No wheeze or crackles.  GI: soft, nbs.  Nontender to palpation MSK: strength equal b/l. Normal gait Ext: no pedal edema Skin: warm and dry, no rashes Psych: pleasant affect.  Spontaneous speech    No results found for any visits on 01/25/24.      Assessment & Plan:   Physical exam, annual  Hyperlipidemia associated with type 2 diabetes mellitus (HCC)  Assessment & Plan: LDL is elevated at 107 despite being on Repatha . This is an increase from 52 one year ago, likely related to weight gain. History of statin intolerance (rhabdomyolysis) is a contraindication to Zetia  as well. - Continue Repatha . - Will discuss adding  bempedoic acid with cardiology at upcoming appointment as a potential option for further LDL lowering.   Chronic bilateral low back pain without sciatica Assessment & Plan: Chronic Low Back and Leg Pain (Lumbar Spinal Stenosis) Chronic pain secondary to lumbar spinal stenosis, limiting activity. Recent epidural steroid injection was beneficial. Taking high doses of acetaminophen  daily. Discussed switching from Lexapro  to duloxetine  (Cymbalta ) for potential dual benefit for mood and chronic pain. - Start Cymbalta  30 mg daily after tapering off Lexapro . - Taper Lexapro  by taking half a pill (5 mg) daily for one week, then half a pill every other day for one week, then stop. - Follow up in 2 months to assess efficacy and for any adjustments. - Continue current pain management plan with Dr. Eldonna, including potential for another epidural steroid injection.   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  Has cardiodefibrillator in place.    Chronic heart failure with preserved ejection fraction (HFpEF) (HCC) Assessment & Plan: Continues to follow with cardiology regularly.  Recently added spironolactone  to coreg , jardiance , losartan , and lasix .     Weight gain Assessment & Plan: History of ~15 lb weight gain this year with increased appetite. BMI is 27. Diet is reportedly healthy but portion sizes have increased. Discussed GLP-1 agonist [facility]) as an option given cardiovascular disease, but patient is hesitant at this time. - Plan to trial dietary modification by reducing portion sizes and/or meal frequency from five to four times per day. - Will reassess weight at follow-up in two months.   Immunization due -     Flu vaccine HIGH DOSE PF(Fluzone Trivalent)  Other orders -     DULoxetine  HCl; Take 1 capsule (30 mg total)  by mouth daily.  Dispense: 30 capsule; Refill: 2     Return in about 2 months (around 03/26/2024) for medication change.    Toribio MARLA Slain, MD

## 2024-01-25 NOTE — Assessment & Plan Note (Signed)
 Continue carvedilol  6.25 bid and eliquis  bid.  Has cardiodefibrillator in place.

## 2024-02-05 DIAGNOSIS — R339 Retention of urine, unspecified: Secondary | ICD-10-CM | POA: Diagnosis not present

## 2024-02-08 ENCOUNTER — Encounter: Payer: Self-pay | Admitting: Radiology

## 2024-02-20 ENCOUNTER — Other Ambulatory Visit: Payer: Self-pay | Admitting: Family Medicine

## 2024-02-23 ENCOUNTER — Other Ambulatory Visit: Payer: Self-pay | Admitting: Family Medicine

## 2024-02-23 ENCOUNTER — Other Ambulatory Visit: Payer: Self-pay | Admitting: Internal Medicine

## 2024-02-23 DIAGNOSIS — N401 Enlarged prostate with lower urinary tract symptoms: Secondary | ICD-10-CM

## 2024-03-07 NOTE — Progress Notes (Unsigned)
 Electrophysiology Clinic Note    Date:  03/08/2024  Patient ID:  Jacob Harrison [birth year], MRN [id number] PCP:  Chandra Toribio POUR, MD  Cardiologist:  Lonni Hanson, MD  Electrophysiologist:  OLE ONEIDA HOLTS, MD  Electrophysiology APP:  Bashar Milam, NP      Discussed the use of AI scribe software for clinical note transcription with the patient, who gave verbal consent to proceed.   Patient Profile    Chief Complaint: ICD, afib follow-up  History of Present Illness: Jacob Harrison is a 83 y.o. male with PMH notable for ICM, HFrEF, ICD, CAD s/p CABG, s/p redo CABG, parox Afib, HLD; seen today for OLE ONEIDA HOLTS, MD for routine electrophysiology followup.   I last saw him 07/2022 for routine ICD follow-up.  Device clinic was alerted to afib on ICD 08/2022, he saw AFib clinic and was started on eliquis . He was in sinus rhythm during appt.   He has established care with HF group and has been seen regularly by CPP for GDMT titration, most recently 12/2023 where lasix  was adjusted town to 80 AM, 40mg  PM from 80 BID.  On follow-up today, he is overall doing well from a cardiac standpoint.  He continues to take Eliquis  twice daily, has bruising to hands and arms due to clumsiness but no significant bleeding via GI/GU tracts.  He does not believe he has had any further atrial fibrillation episodes within the last year. He has noticed in the past 2 to 3 weeks he has reduced exercise tolerance.  He is now only able to workout about 10 minutes before needing to stop to catch his breath.  He recovers quickly and then resumes his workouts.  He believes that this reduction in exercise tolerance is due to dietary indiscretions related to the holiday season.  He has also noticed that his resting heart rate has increased to mid 60s from 50s. He has chronic lower extremity edema left greater than right d/t graft site for CABG. He states that today's edema is really good for  him.  He has noticed a slow gradual increase in weight, believes that it is due to increased p.o. intake.     Arrhythmia/Device History MDT single chamber ICD, imp 03/2021; dx ICM, HFrEF     ROS:  Please see the history of present illness. All other systems are reviewed and otherwise negative.    Physical Exam    VS:  BP 100/64 (BP Location: Left Arm, Patient Position: Sitting, Cuff Size: Normal)   Pulse 73   Ht 5' 7 (1.702 m)   Wt 172 lb 4 oz (78.1 kg)   SpO2 97%   BMI 26.98 kg/m  BMI: Body mass index is 26.98 kg/m.           Wt Readings from Last 3 Encounters:  03/08/24 172 lb 4 oz (78.1 kg)  01/25/24 173 lb 1.9 oz (78.5 kg)  12/29/23 172 lb 3.2 oz (78.1 kg)     GEN- The patient is well appearing, alert and oriented x 3 today.   Lungs- Clear to ausculation bilaterally, normal work of breathing.  Heart- Regular rate and rhythm, RSB murmur appreciated, rubs or gallops Extremities- 1-2+ peripheral edema L > R, warm, dry Skin-  device pocket well-healed, no tethering   Device interrogation done today and reviewed by myself:  Battery 8.8 years Lead thresholds, impedence, sensing stable  VP < 1% OptiVol low No episodes No changes made today   Studies  Reviewed   Previous EP, cardiology notes.    EKG is ordered. Personal review of EKG from today shows:    EKG Interpretation Date/Time:  Tuesday March 08 2024 11:08:35 EST Ventricular Rate:  73 PR Interval:  200 QRS Duration:  94 QT Interval:  400 QTC Calculation: 440 R Axis:   36  Text Interpretation: Normal sinus rhythm Confirmed by Amonte Brookover [phone]) on 03/08/2024 11:15:52 AM    TTE, 10/14/2023  1. Left ventricular ejection fraction, by estimation, is 20 to 25%. Left ventricular ejection fraction by PLAX is 25 %. The left ventricle has severely decreased function. The left ventricle demonstrates global hypokinesis, basal wall motion best preserved. The left ventricular internal cavity size was mildly  dilated. Left ventricular diastolic parameters are consistent with Grade I diastolic dysfunction (impaired relaxation).   2. Right ventricular systolic function is normal. The right ventricular  size is normal. Tricuspid regurgitation signal is inadequate for assessing PA pressure.   3. The mitral valve is normal in structure. Mild mitral valve  regurgitation. No evidence of mitral stenosis.   4. The aortic valve is normal in structure. There is moderate calcification of the aortic valve. Aortic valve regurgitation is not visualized. Mild aortic valve stenosis. Aortic valve area, by VTI measures 1.19 cm. Aortic valve mean gradient measures 16.0 mmHg. Aortic valve Vmax measures 2.71 m/s.   5. There is borderline dilatation of the ascending aorta, measuring 38 mm.   6. The inferior vena cava is normal in size with greater than 50% respiratory variability, suggesting right atrial pressure of 3 mmHg.   R/LHC, 05/27/2022 Severe multivessel coronary artery disease, including chronic total occlusions of mid LAD and proximal RCA.  There is moderate LMCA and LCx disease.  Overall appearance is similar to prior catheterization in 2020. Known occlusions of all bypass grafts; occlusions of SVG-LAD and SVG-RCA redemonstrated today. Normal left heart and right heart filling pressures. Normal Fick cardiac output/index.     TTE, 04/21/2022  1. Left ventricular ejection fraction, by estimation, is 30 to 35%. The left ventricle has moderate to severely decreased function. The left ventricle demonstrates global hypokinesis. There is mild left ventricular hypertrophy of the basal-septal segment. Left ventricular diastolic parameters are indeterminate.  2. Right ventricular systolic function is low normal. The right ventricular size is mildly enlarged.   3. Left atrial size was mildly dilated.   4. The mitral valve is normal in structure. Mild mitral valve regurgitation.   5. The aortic valve is tricuspid. Aortic  valve regurgitation is not visualized. Mild aortic valve stenosis. Aortic valve mean gradient measures 11.0 mmHg.   6. Aortic dilatation noted. There is mild dilatation of the ascending aorta, measuring 39 mm.   7. The inferior vena cava is normal in size with greater than 50% respiratory variability, suggesting right atrial pressure of 3 mmHg.   Comparison(s): Limited TTE (02/20/2021): LVEF 30-35%. Mild mitral regurgitation. Aortic sclerosis without stenosis.    Assessment and Plan     #) ICM s/p ICD Device functioning well, see Paceart for details No ventricular arrhythmias noted Low V pacing less than 1%  #) parox Afib Very low burden by symptoms, confirms that this Continue to monitor via device  #) Hypercoag d/t parox afib CHA2DS2-VASc Score = at least 6 [CHF History: 1, HTN History: 1, Diabetes History: 1, Stroke History: 0, Vascular Disease History: 1, Age Score: 2, Gender Score: 0].  Therefore, the patient's annual risk of stroke is 9.7 %.    Stroke  ppx - 5mg  eliquis  BID, appropriately dosed No significant bleeding concerns Update CBC today  #) HFrEF Somewhat reduced exercise tolerance in the last 2 to 3 weeks.  Requested patient to continue regular physical activity and monitor symptoms Coreg  6.25mg  BID, 10mg  jardiance  daily, 12.5mg  losartan  daily, 12.5mg  spiro daily Diuretic 80mg  in AM, 40mg  in PM Optivol stable      Current medicines are reviewed at length with the patient today.   The patient does not have concerns regarding his medicines.  The following changes were made today:  none  Labs/ tests ordered today include:  Orders Placed This Encounter  Procedures   CBC   EKG 12-Lead     Disposition: Follow up with Dr. Kennyth or EP APP in 12 months Continue remote monitoring   Signed, Khylei Wilms, NP  03/08/24  2:25 PM  Electrophysiology [facility]

## 2024-03-08 ENCOUNTER — Encounter: Payer: Self-pay | Admitting: Cardiology

## 2024-03-08 ENCOUNTER — Ambulatory Visit: Attending: Cardiology | Admitting: Cardiology

## 2024-03-08 VITALS — BP 100/64 | HR 73 | Ht 67.0 in | Wt 172.2 lb

## 2024-03-08 DIAGNOSIS — I255 Ischemic cardiomyopathy: Secondary | ICD-10-CM

## 2024-03-08 DIAGNOSIS — Z95810 Presence of automatic (implantable) cardiac defibrillator: Secondary | ICD-10-CM

## 2024-03-08 DIAGNOSIS — I5022 Chronic systolic (congestive) heart failure: Secondary | ICD-10-CM | POA: Diagnosis not present

## 2024-03-08 DIAGNOSIS — D6869 Other thrombophilia: Secondary | ICD-10-CM

## 2024-03-08 DIAGNOSIS — I480 Paroxysmal atrial fibrillation: Secondary | ICD-10-CM

## 2024-03-08 LAB — CUP PACEART INCLINIC DEVICE CHECK
Date Time Interrogation Session: 20251202143259
Implantable Lead Connection Status: 753985
Implantable Lead Implant Date: 20221214
Implantable Lead Location: 753860
Implantable Pulse Generator Implant Date: 20221214

## 2024-03-08 NOTE — Patient Instructions (Signed)
 Medication Instructions:  Your physician recommends that you continue on your current medications as directed. Please refer to the Current Medication list given to you today.  *If you need a refill on your cardiac medications before your next appointment, please call your pharmacy*  Lab Work: Your provider would like for you to have following labs drawn today CBC.     Testing/Procedures: No test ordered today   Follow-Up: At [facility], you and your health needs are our priority.  As part of our continuing mission to provide you with exceptional heart care, our providers are all part of one team.  This team includes your primary Cardiologist (physician) and Advanced Practice Providers or APPs (Physician Assistants and Nurse Practitioners) who all work together to provide you with the care you need, when you need it.  Your next appointment:   1 year(s)  Provider:   Suzann Riddle, NP

## 2024-03-09 ENCOUNTER — Ambulatory Visit: Payer: Self-pay | Admitting: Cardiology

## 2024-03-09 LAB — CBC
Hematocrit: 44.9 % (ref 37.5–51.0)
Hemoglobin: 14.6 g/dL (ref 13.0–17.7)
MCH: 33.2 pg — ABNORMAL HIGH (ref 26.6–33.0)
MCHC: 32.5 g/dL (ref 31.5–35.7)
MCV: 102 fL — ABNORMAL HIGH (ref 79–97)
Platelets: 229 x10E3/uL (ref 150–450)
RBC: 4.4 x10E6/uL (ref 4.14–5.80)
RDW: 12.5 % (ref 11.6–15.4)
WBC: 14.8 x10E3/uL — ABNORMAL HIGH (ref 3.4–10.8)

## 2024-03-12 ENCOUNTER — Other Ambulatory Visit: Payer: Self-pay | Admitting: Internal Medicine

## 2024-03-16 ENCOUNTER — Ambulatory Visit: Payer: Medicare HMO

## 2024-03-16 DIAGNOSIS — I255 Ischemic cardiomyopathy: Secondary | ICD-10-CM | POA: Diagnosis not present

## 2024-03-17 LAB — CUP PACEART REMOTE DEVICE CHECK
Battery Remaining Longevity: 105 mo
Battery Voltage: 3.02 V
Brady Statistic RV Percent Paced: 0.12 %
Date Time Interrogation Session: 20251210232606
HighPow Impedance: 81 Ohm
Implantable Lead Connection Status: 753985
Implantable Lead Implant Date: 20221214
Implantable Lead Location: 753860
Implantable Pulse Generator Implant Date: 20221214
Lead Channel Impedance Value: 323 Ohm
Lead Channel Impedance Value: 456 Ohm
Lead Channel Pacing Threshold Amplitude: 0.75 V
Lead Channel Pacing Threshold Pulse Width: 0.4 ms
Lead Channel Sensing Intrinsic Amplitude: 15.625 mV
Lead Channel Sensing Intrinsic Amplitude: 15.625 mV
Lead Channel Setting Pacing Amplitude: 1.5 V
Lead Channel Setting Pacing Pulse Width: 0.4 ms
Lead Channel Setting Sensing Sensitivity: 0.3 mV
Zone Setting Status: 755011
Zone Setting Status: 755011

## 2024-03-23 NOTE — Progress Notes (Signed)
 Remote ICD Transmission

## 2024-03-24 ENCOUNTER — Ambulatory Visit: Payer: Self-pay | Admitting: Cardiology

## 2024-03-28 ENCOUNTER — Ambulatory Visit (INDEPENDENT_AMBULATORY_CARE_PROVIDER_SITE_OTHER): Admitting: Family Medicine

## 2024-03-28 ENCOUNTER — Encounter: Payer: Self-pay | Admitting: Internal Medicine

## 2024-03-28 ENCOUNTER — Encounter: Payer: Self-pay | Admitting: Family Medicine

## 2024-03-28 VITALS — BP 110/68 | HR 67 | Ht 67.0 in | Wt 173.4 lb

## 2024-03-28 DIAGNOSIS — G8929 Other chronic pain: Secondary | ICD-10-CM

## 2024-03-28 DIAGNOSIS — E1169 Type 2 diabetes mellitus with other specified complication: Secondary | ICD-10-CM | POA: Diagnosis not present

## 2024-03-28 DIAGNOSIS — M545 Low back pain, unspecified: Secondary | ICD-10-CM

## 2024-03-28 DIAGNOSIS — F0631 Mood disorder due to known physiological condition with depressive features: Secondary | ICD-10-CM | POA: Diagnosis not present

## 2024-03-28 DIAGNOSIS — Z79899 Other long term (current) drug therapy: Secondary | ICD-10-CM

## 2024-03-28 DIAGNOSIS — E119 Type 2 diabetes mellitus without complications: Secondary | ICD-10-CM

## 2024-03-28 DIAGNOSIS — Z7984 Long term (current) use of oral hypoglycemic drugs: Secondary | ICD-10-CM | POA: Diagnosis not present

## 2024-03-28 DIAGNOSIS — E785 Hyperlipidemia, unspecified: Secondary | ICD-10-CM | POA: Diagnosis not present

## 2024-03-28 MED ORDER — BEMPEDOIC ACID 180 MG PO TABS: 180.0000 mg | ORAL_TABLET | Freq: Every day | ORAL | 3 refills | Status: AC

## 2024-03-28 MED ORDER — DULOXETINE HCL 30 MG PO CPEP: 30.0000 mg | ORAL_CAPSULE | Freq: Two times a day (BID) | ORAL | 1 refills | Status: AC

## 2024-03-28 NOTE — Assessment & Plan Note (Signed)
 Monitored. No recent foot exam. - Performed foot exam today. - Will schedule follow-up in 3-4 months for diabetes labs.

## 2024-03-28 NOTE — Assessment & Plan Note (Signed)
 Previously switched from lexapro  to duloxetine  30mg  daily. Current duloxetine  less effective for mood. Pain improved, anxiety not.   - Increased duloxetine  to 30 mg twice daily.

## 2024-03-28 NOTE — Progress Notes (Unsigned)
 "  Established Patient Office Visit  Subjective   Patient ID: Jacob Harrison, male    DOB: [birth year]  Age: 83 y.o. MRN: [id number]  Chief Complaint  Patient presents with   Medical Management of Chronic Issues    Discussed the use of AI scribe software for clinical note transcription with the patient, who gave verbal consent to proceed.  History of Present Illness   Jacob Harrison is an 83 year old male who presents for follow-up on medication management for anxiety and pain.  He is following up on his medication management, specifically the switch to duloxetine  for anxiety and pain. Duloxetine  has not been as effective as Cymbalta  for managing stress and nervousness, although it has helped reduce pain levels. He has been able to decrease his Tylenol  intake from 2000-3000 mg to 1500-2000 mg daily for back and leg pain. He currently takes duloxetine  30 mg once daily.  He recently saw his cardiologist. He discussed starting bempedoic acid  to lower his LDL and total cholesterol levels and received a message confirming the prescription today.  He has trouble with night vision and does not drive at night due to this issue. He has a history of macular degeneration and retina issues, which are being monitored by his ophthalmologist at [facility]. He last visited in early 2025 and plans to return in the first quarter of the upcoming year.  He is sleeping well and his weight has stabilized, although he is not losing weight. He attributes some dietary changes to the holiday season, noting increased salt intake when eating with family and friends. No new issues or concerns were reported. No recent eye injections for macular degeneration and has not been referred to a retina specialist.          The ASCVD Risk score (Arnett DK, et al., 2019) failed to calculate for the following reasons:   The 2019 ASCVD risk score is only valid for ages 59 to 10   Risk score  cannot be calculated because patient has a medical history suggesting prior/existing ASCVD   * - Cholesterol units were assumed  Health Maintenance Due  Topic Date Due   COVID-19 Vaccine (3 - Pfizer risk series) 06/29/2019   OPHTHALMOLOGY EXAM  07/28/2023      Objective:     BP 110/68   Pulse 67   Ht 5' 7 (1.702 m)   Wt 173 lb 6.4 oz (78.7 kg)   SpO2 100%   BMI 27.16 kg/m  {Vitals History (Optional):23777}  Physical Exam   NEUROLOGICAL: Sensation intact bilaterally in feet.        No results found for any visits on 03/28/24.      Assessment & Plan:   Type 2 diabetes mellitus without complication, without long-term current use of insulin  (HCC) Assessment & Plan: Monitored. No recent foot exam. - Performed foot exam today. - Will schedule follow-up in 3-4 months for diabetes labs.   Hyperlipidemia associated with type 2 diabetes mellitus (HCC) Assessment & Plan: Managed with repatha .  Plans to add bempedoic acid  w/ cardiologist.  - Will recheck cholesterol levels after a period of treatment.   Chronic bilateral low back pain without sciatica Assessment & Plan: Duloxetine  effective, reducing acetaminophen  need. Increasing duloxetine  to 30mg  bid.    Depression due to physical illness Assessment & Plan: Previously switched from lexapro  to duloxetine  30mg  daily. Current duloxetine  less effective for mood. Pain improved, anxiety not.   - Increased duloxetine  to 30 mg twice  daily.     Other orders -     DULoxetine  HCl; Take 1 capsule (30 mg total) by mouth 2 (two) times daily.  Dispense: 180 capsule; Refill: 1              Return in about 15 weeks (around 07/11/2024) for DM.    Toribio MARLA Slain, MD  "

## 2024-03-28 NOTE — Patient Instructions (Signed)
" °  VISIT SUMMARY: Today, we discussed your current medications and health concerns. We reviewed your anxiety and pain management, cholesterol levels, diabetes, and eye health. Adjustments were made to your medication regimen to better manage your symptoms.  YOUR PLAN: -ANXIETY DISORDER: Anxiety disorder involves excessive worry or fear. Your current medication, duloxetine , has not been as effective as Cymbalta  for managing your anxiety. We have increased your duloxetine  dose to 30 mg twice daily to help improve your symptoms. The prescription has been sent to your pharmacy.  -CHRONIC BACK AND LEG PAIN: Chronic pain is long-lasting pain that can affect your daily life. Duloxetine  has been effective in reducing your pain, which has allowed you to decrease your intake of Tylenol .  -HYPERLIPIDEMIA: Hyperlipidemia means having high levels of fats (like cholesterol) in your blood. You will continue taking bempedoic acid  as prescribed by your cardiologist to manage your cholesterol levels. We will recheck your cholesterol levels after some time on this medication.  -TYPE 2 DIABETES MELLITUS: Type 2 diabetes is a condition where your body does not use insulin  properly, leading to high blood sugar levels. We performed a foot exam today and will schedule a follow-up in 3-4 months to check your diabetes labs.  -NONEXUDATIVE AGE-RELATED MACULAR DEGENERATION: This is an eye condition that can cause vision loss, particularly in older adults. You are experiencing difficulty with night vision and are avoiding driving at night. We will schedule a follow-up with your ophthalmologist to evaluate your night vision and monitor your macular degeneration.  INSTRUCTIONS: Please follow up with your ophthalmologist for an evaluation of your night vision and macular degeneration. We will also schedule a follow-up appointment in 3-4 months to check your diabetes labs. Continue taking your medications as prescribed and monitor  your symptoms. If you have any new concerns or issues, please contact our office.    "

## 2024-03-28 NOTE — Telephone Encounter (Signed)
 Please send in a prescription for bempedoic acid  180 mg daily and have Jacob Harrison get a lipid panel and CMP in ~3 months.  Thanks.  Lonni Hanson, MD [facility]

## 2024-03-28 NOTE — Assessment & Plan Note (Signed)
 Managed with repatha .  Plans to add bempedoic acid  w/ cardiologist.  - Will recheck cholesterol levels after a period of treatment.

## 2024-03-28 NOTE — Assessment & Plan Note (Signed)
 Duloxetine  effective, reducing acetaminophen  need. Increasing duloxetine  to 30mg  bid.

## 2024-04-04 ENCOUNTER — Other Ambulatory Visit: Payer: Self-pay | Admitting: Medical

## 2024-04-27 ENCOUNTER — Encounter: Payer: Self-pay | Admitting: Internal Medicine

## 2024-04-27 ENCOUNTER — Ambulatory Visit: Attending: Internal Medicine | Admitting: Internal Medicine

## 2024-04-27 VITALS — BP 103/60 | HR 62 | Ht 67.0 in | Wt 173.8 lb

## 2024-04-27 DIAGNOSIS — I255 Ischemic cardiomyopathy: Secondary | ICD-10-CM | POA: Diagnosis not present

## 2024-04-27 DIAGNOSIS — E785 Hyperlipidemia, unspecified: Secondary | ICD-10-CM

## 2024-04-27 DIAGNOSIS — I5022 Chronic systolic (congestive) heart failure: Secondary | ICD-10-CM | POA: Diagnosis not present

## 2024-04-27 DIAGNOSIS — E1169 Type 2 diabetes mellitus with other specified complication: Secondary | ICD-10-CM | POA: Diagnosis not present

## 2024-04-27 DIAGNOSIS — I25118 Atherosclerotic heart disease of native coronary artery with other forms of angina pectoris: Secondary | ICD-10-CM | POA: Diagnosis not present

## 2024-04-27 DIAGNOSIS — Z79899 Other long term (current) drug therapy: Secondary | ICD-10-CM | POA: Diagnosis not present

## 2024-04-27 DIAGNOSIS — I480 Paroxysmal atrial fibrillation: Secondary | ICD-10-CM | POA: Diagnosis not present

## 2024-04-27 MED ORDER — SPIRONOLACTONE 25 MG PO TABS: 12.5000 mg | ORAL_TABLET | Freq: Every day | ORAL | 3 refills | Status: AC

## 2024-04-27 NOTE — Progress Notes (Signed)
 " Cardiology Office Note:  .   Date:  04/27/2024  ID:  Jacob Harrison, DOB 07-26-1940, MRN [id number] PCP: Jacob Toribio POUR, MD  [facility] Providers Cardiologist:  Jacob Hanson, MD Electrophysiologist:  Jacob ONEIDA HOLTS, MD (Inactive)  Electrophysiology APP:  Riddle, Suzann, NP     History of Present Illness: .   Jacob Harrison is a 84 y.o. 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  I last saw him in July, at which time he was feeling fairly well.  He also saw Dr. Gardenia that same day in the heart failure clinic, at which time he was started on spironolactone .  He was also seen in the EP clinic last month, at which time he noticed reduced exercise tolerance over the preceding 2 to 3 weeks.  He also noted that his resting heart rate had increased a bit as well as a modest weight increase.  No medication changes were made at that time.  [name] reports that he has been feeling well from a heart standpoint.  His main concern is ongoing balance issues that he attributes to a combination of peripheral neuropathy as well as core muscle weakness.  He has fallen twice over the last year but fortunately has not had any significant injuries.  He denies chest pain, palpitations, and lightheadedness.  His breathing has been pretty good.  He feels like his exercise tolerance is stable or even a little better than it was last year at this time.  He notes that his blood pressures are typically 90-100/55-60.  He has mild dependent lower extremity edema, which is stable.  He notes that bempedoic acid  was added to evolocumab  in the fall due to  suboptimal lipid control; he is tolerating this well.  ROS: See HPI  Studies Reviewed: [facility]RA   EKG Interpretation Date/Time:  Wednesday April 27 2024 10:19:34 EST Ventricular Rate:  62 PR Interval:  228 QRS Duration:  94 QT Interval:  442 QTC Calculation: 448 R Axis:   50  Text Interpretation: Sinus rhythm with 1st degree A-V block Inferior infarct (cited on or before 27-Apr-2024) Anterolateral infarct (cited on or before 27-Apr-2024) Abnormal ECG When compared with ECG of 08-Mar-2024 11:08, No significant change was found Confirmed by Jacob Harrison, Jacob [phone]) on 04/27/2024 5:34:43 PM     Risk Assessment/Calculations:    CHA2DS2-VASc Score = 6   This indicates a 9.7% annual risk of stroke. The patient's score is based upon: CHF History: 1 HTN History: 1 Diabetes History: 1 Stroke History: 0 Vascular Disease History: 1 Age Score: 2 Gender Score: 0            Physical Exam:   VS:  BP 103/60 (BP Location: Left Arm, Patient Position: Sitting, Cuff Size: Normal)   Pulse 62 Comment: 65 oximeter  Ht 5' 7 (1.702 m)   Wt 173 lb 12.8 oz (78.8 kg)   SpO2 99%   BMI 27.22 kg/m    Wt Readings from Last 3 Encounters:  04/27/24 173 lb 12.8 oz (78.8 kg)  03/28/24 173 lb 6.4 oz (78.7 kg)  03/08/24 172 lb 4 oz (78.1 kg)    General:  NAD. Neck: No JVD or HJR. Lungs: Dry crackles  in left lower lung.  No wheezes.  Good air movement. Heart: Regular rate and rhythm without murmurs, rubs, or gallops. Abdomen: Soft, nontender, nondistended. Extremities: Trace pretibial edema.  ASSESSMENT AND PLAN: .    Coronary artery disease with stable angina and hyperlipidemia associated with type 2 diabetes mellitus: No significant chest pain noted.  Will continue secondary prevention with apixaban  and lieu of aspirin  as well as combination evolocumab  and bempedoic acid  given history of rhabdomyolysis with statins.  I will check CMP, hemoglobin A1c, and lipid panel today.  Continue antianginal therapy  with isosorbide  mononitrate and ranolazine .  Chronic HFrEF due to ischemic cardiomyopathy: [name] appears euvolemic with stable NYHA class II symptoms.  We will plan to continue current regimen of carvedilol , empagliflozin , furosemide , losartan , and spironolactone , as soft blood pressures preclude further dose escalation.  [name] will continue to follow-up with the heart failure pharmacy team for titration as possible.  I will check a CMP today to ensure stable renal function and electrolytes.  Ongoing ICD follow-up through the device clinic.  Paroxysmal atrial fibrillation: EKG today shows sinus rhythm.  No palpitations noted.  Continue apixaban  5 mg twice daily in the setting of a CHA2DS2-VASc score of at least 6.    Dispo: Return to clinic in 6 months  Signed, Jacob Hanson, MD  "

## 2024-04-27 NOTE — Patient Instructions (Signed)
 Medication Instructions:  Your physician recommends that you continue on your current medications as directed. Please refer to the Current Medication list given to you today.    *If you need a refill on your cardiac medications before your next appointment, please call your pharmacy*  Lab Work: Your provider would like for you to have following labs drawn today CMP, Lipid, A1C.     Testing/Procedures: No test ordered today   Follow-Up: At [facility], you and your health needs are our priority.  As part of our continuing mission to provide you with exceptional heart care, our providers are all part of one team.  This team includes your primary Cardiologist (physician) and Advanced Practice Providers or APPs (Physician Assistants and Nurse Practitioners) who all work together to provide you with the care you need, when you need it.  Your next appointment:   6 month(s)  Provider:   You may see Lonni Hanson, MD or one of the following Advanced Practice Providers on your designated Care Team:   Lonni Meager, NP Lesley Maffucci, PA-C Bernardino Bring, PA-C Cadence Show Low, PA-C Tylene Lunch, NP Barnie Hila, NP

## 2024-04-28 ENCOUNTER — Ambulatory Visit: Payer: Self-pay | Admitting: Internal Medicine

## 2024-04-28 LAB — COMPREHENSIVE METABOLIC PANEL WITH GFR
ALT: 22 IU/L (ref 0–44)
AST: 24 IU/L (ref 0–40)
Albumin: 4.7 g/dL (ref 3.7–4.7)
Alkaline Phosphatase: 80 IU/L (ref 48–129)
BUN/Creatinine Ratio: 25 — ABNORMAL HIGH (ref 10–24)
BUN: 31 mg/dL — ABNORMAL HIGH (ref 8–27)
Bilirubin Total: 0.7 mg/dL (ref 0.0–1.2)
CO2: 24 mmol/L (ref 20–29)
Calcium: 9.5 mg/dL (ref 8.6–10.2)
Chloride: 90 mmol/L — ABNORMAL LOW (ref 96–106)
Creatinine, Ser: 1.25 mg/dL (ref 0.76–1.27)
Globulin, Total: 2.1 g/dL (ref 1.5–4.5)
Glucose: 111 mg/dL — ABNORMAL HIGH (ref 70–99)
Potassium: 4.9 mmol/L (ref 3.5–5.2)
Sodium: 134 mmol/L (ref 134–144)
Total Protein: 6.8 g/dL (ref 6.0–8.5)
eGFR: 58 mL/min/1.73 — ABNORMAL LOW

## 2024-04-28 LAB — LIPID PANEL
Chol/HDL Ratio: 2.4 ratio (ref 0.0–5.0)
Cholesterol, Total: 170 mg/dL (ref 100–199)
HDL: 71 mg/dL
LDL Chol Calc (NIH): 73 mg/dL (ref 0–99)
Triglycerides: 156 mg/dL — ABNORMAL HIGH (ref 0–149)
VLDL Cholesterol Cal: 26 mg/dL (ref 5–40)

## 2024-04-28 LAB — HEMOGLOBIN A1C
Est. average glucose Bld gHb Est-mCnc: 111 mg/dL
Hgb A1c MFr Bld: 5.5 % (ref 4.8–5.6)

## 2024-04-30 ENCOUNTER — Other Ambulatory Visit: Payer: Self-pay | Admitting: Internal Medicine

## 2024-06-15 ENCOUNTER — Ambulatory Visit

## 2024-07-05 ENCOUNTER — Other Ambulatory Visit

## 2024-07-12 ENCOUNTER — Ambulatory Visit: Admitting: Family Medicine

## 2024-07-21 ENCOUNTER — Ambulatory Visit: Admitting: Cardiology

## 2024-09-01 ENCOUNTER — Ambulatory Visit

## 2024-09-14 ENCOUNTER — Ambulatory Visit

## 2024-12-14 ENCOUNTER — Ambulatory Visit

## 2025-03-15 ENCOUNTER — Ambulatory Visit

## 2025-06-14 ENCOUNTER — Ambulatory Visit
